# Patient Record
Sex: Male | Born: 1970
Health system: Southern US, Community
[De-identification: ages and names within clinical notes are randomized; demographics above are authoritative.]

## PROBLEM LIST (undated history)

## (undated) ENCOUNTER — Emergency Department (HOSPITAL_BASED_OUTPATIENT_CLINIC_OR_DEPARTMENT_OTHER)

## (undated) DIAGNOSIS — K4090 Unilateral inguinal hernia, without obstruction or gangrene, not specified as recurrent: Secondary | ICD-10-CM

## (undated) DIAGNOSIS — F32A Depression, unspecified: Secondary | ICD-10-CM

## (undated) DIAGNOSIS — J189 Pneumonia, unspecified organism: Secondary | ICD-10-CM

## (undated) DIAGNOSIS — K219 Gastro-esophageal reflux disease without esophagitis: Secondary | ICD-10-CM

## (undated) DIAGNOSIS — R161 Splenomegaly, not elsewhere classified: Secondary | ICD-10-CM

## (undated) DIAGNOSIS — R569 Unspecified convulsions: Secondary | ICD-10-CM

## (undated) DIAGNOSIS — R2689 Other abnormalities of gait and mobility: Secondary | ICD-10-CM

## (undated) DIAGNOSIS — J45909 Unspecified asthma, uncomplicated: Secondary | ICD-10-CM

## (undated) DIAGNOSIS — M1990 Unspecified osteoarthritis, unspecified site: Secondary | ICD-10-CM

## (undated) DIAGNOSIS — Z720 Tobacco use: Secondary | ICD-10-CM

## (undated) DIAGNOSIS — D696 Thrombocytopenia, unspecified: Secondary | ICD-10-CM

## (undated) DIAGNOSIS — R519 Headache, unspecified: Secondary | ICD-10-CM

## (undated) DIAGNOSIS — F419 Anxiety disorder, unspecified: Secondary | ICD-10-CM

## (undated) DIAGNOSIS — J302 Other seasonal allergic rhinitis: Secondary | ICD-10-CM

## (undated) DIAGNOSIS — R269 Unspecified abnormalities of gait and mobility: Secondary | ICD-10-CM

## (undated) HISTORY — DX: Depression, unspecified: F32.A

## (undated) HISTORY — DX: Other seasonal allergic rhinitis: J30.2

## (undated) HISTORY — DX: Unspecified abnormalities of gait and mobility: R26.9

## (undated) HISTORY — PX: HERNIA REPAIR: SHX51

## (undated) HISTORY — DX: Other abnormalities of gait and mobility: R26.89

---

## 1998-03-06 ENCOUNTER — Emergency Department (HOSPITAL_COMMUNITY): Admission: EM | Admit: 1998-03-06 | Discharge: 1998-03-06 | Payer: Self-pay | Admitting: Emergency Medicine

## 1998-06-22 ENCOUNTER — Emergency Department (HOSPITAL_COMMUNITY): Admission: EM | Admit: 1998-06-22 | Discharge: 1998-06-22 | Payer: Self-pay | Admitting: *Deleted

## 1998-07-02 ENCOUNTER — Ambulatory Visit (HOSPITAL_BASED_OUTPATIENT_CLINIC_OR_DEPARTMENT_OTHER): Admission: RE | Admit: 1998-07-02 | Discharge: 1998-07-02 | Payer: Self-pay | Admitting: *Deleted

## 1998-09-02 ENCOUNTER — Inpatient Hospital Stay (HOSPITAL_COMMUNITY): Admission: EM | Admit: 1998-09-02 | Discharge: 1998-09-06 | Payer: Self-pay | Admitting: *Deleted

## 1998-09-25 ENCOUNTER — Inpatient Hospital Stay (HOSPITAL_COMMUNITY): Admission: AD | Admit: 1998-09-25 | Discharge: 1998-09-29 | Payer: Self-pay | Admitting: *Deleted

## 1998-10-01 ENCOUNTER — Encounter (HOSPITAL_COMMUNITY): Admission: RE | Admit: 1998-10-01 | Discharge: 1998-12-30 | Payer: Self-pay

## 1999-03-14 ENCOUNTER — Emergency Department (HOSPITAL_COMMUNITY): Admission: EM | Admit: 1999-03-14 | Discharge: 1999-03-14 | Payer: Self-pay | Admitting: Emergency Medicine

## 1999-09-23 ENCOUNTER — Encounter: Admission: RE | Admit: 1999-09-23 | Discharge: 1999-10-01 | Payer: Self-pay | Admitting: Family Medicine

## 2000-08-10 ENCOUNTER — Ambulatory Visit (HOSPITAL_COMMUNITY): Admission: EM | Admit: 2000-08-10 | Discharge: 2000-08-11 | Payer: Self-pay

## 2001-08-22 ENCOUNTER — Ambulatory Visit (HOSPITAL_COMMUNITY): Admission: EM | Admit: 2001-08-22 | Discharge: 2001-08-22 | Payer: Self-pay | Admitting: Emergency Medicine

## 2001-08-22 ENCOUNTER — Emergency Department (HOSPITAL_COMMUNITY): Admission: EM | Admit: 2001-08-22 | Discharge: 2001-08-22 | Payer: Self-pay | Admitting: Emergency Medicine

## 2001-08-22 ENCOUNTER — Encounter: Payer: Self-pay | Admitting: Emergency Medicine

## 2002-12-16 ENCOUNTER — Emergency Department (HOSPITAL_COMMUNITY): Admission: EM | Admit: 2002-12-16 | Discharge: 2002-12-16 | Payer: Self-pay | Admitting: Emergency Medicine

## 2002-12-16 ENCOUNTER — Encounter: Payer: Self-pay | Admitting: Emergency Medicine

## 2003-11-01 ENCOUNTER — Emergency Department (HOSPITAL_COMMUNITY): Admission: EM | Admit: 2003-11-01 | Discharge: 2003-11-02 | Payer: Self-pay | Admitting: Emergency Medicine

## 2004-02-02 ENCOUNTER — Ambulatory Visit (HOSPITAL_COMMUNITY): Admission: RE | Admit: 2004-02-02 | Discharge: 2004-02-02 | Payer: Self-pay | Admitting: Urology

## 2004-02-02 ENCOUNTER — Ambulatory Visit (HOSPITAL_BASED_OUTPATIENT_CLINIC_OR_DEPARTMENT_OTHER): Admission: RE | Admit: 2004-02-02 | Discharge: 2004-02-02 | Payer: Self-pay | Admitting: Urology

## 2004-02-02 ENCOUNTER — Encounter (INDEPENDENT_AMBULATORY_CARE_PROVIDER_SITE_OTHER): Payer: Self-pay | Admitting: Specialist

## 2004-04-21 ENCOUNTER — Emergency Department (HOSPITAL_COMMUNITY): Admission: EM | Admit: 2004-04-21 | Discharge: 2004-04-21 | Payer: Self-pay

## 2004-04-22 ENCOUNTER — Emergency Department (HOSPITAL_COMMUNITY): Admission: EM | Admit: 2004-04-22 | Discharge: 2004-04-22 | Payer: Self-pay | Admitting: Emergency Medicine

## 2004-11-24 HISTORY — PX: INGUINAL HERNIA REPAIR: SHX194

## 2005-02-05 ENCOUNTER — Emergency Department (HOSPITAL_COMMUNITY): Admission: EM | Admit: 2005-02-05 | Discharge: 2005-02-05 | Payer: Self-pay | Admitting: Emergency Medicine

## 2005-04-01 ENCOUNTER — Emergency Department (HOSPITAL_COMMUNITY): Admission: EM | Admit: 2005-04-01 | Discharge: 2005-04-02 | Payer: Self-pay | Admitting: Emergency Medicine

## 2005-04-02 ENCOUNTER — Encounter: Admission: RE | Admit: 2005-04-02 | Discharge: 2005-04-02 | Payer: Self-pay | Admitting: Emergency Medicine

## 2005-04-02 IMAGING — CR DG ANKLE COMPLETE 3+V*R*
3 series · 3 of 3 positions shown · non-contrast
Comparison: none

CLINICAL DATA: Lateral ankle pain

RIGHT ANKLE - 3 VIEW

[view not recorded (1 of 3)]
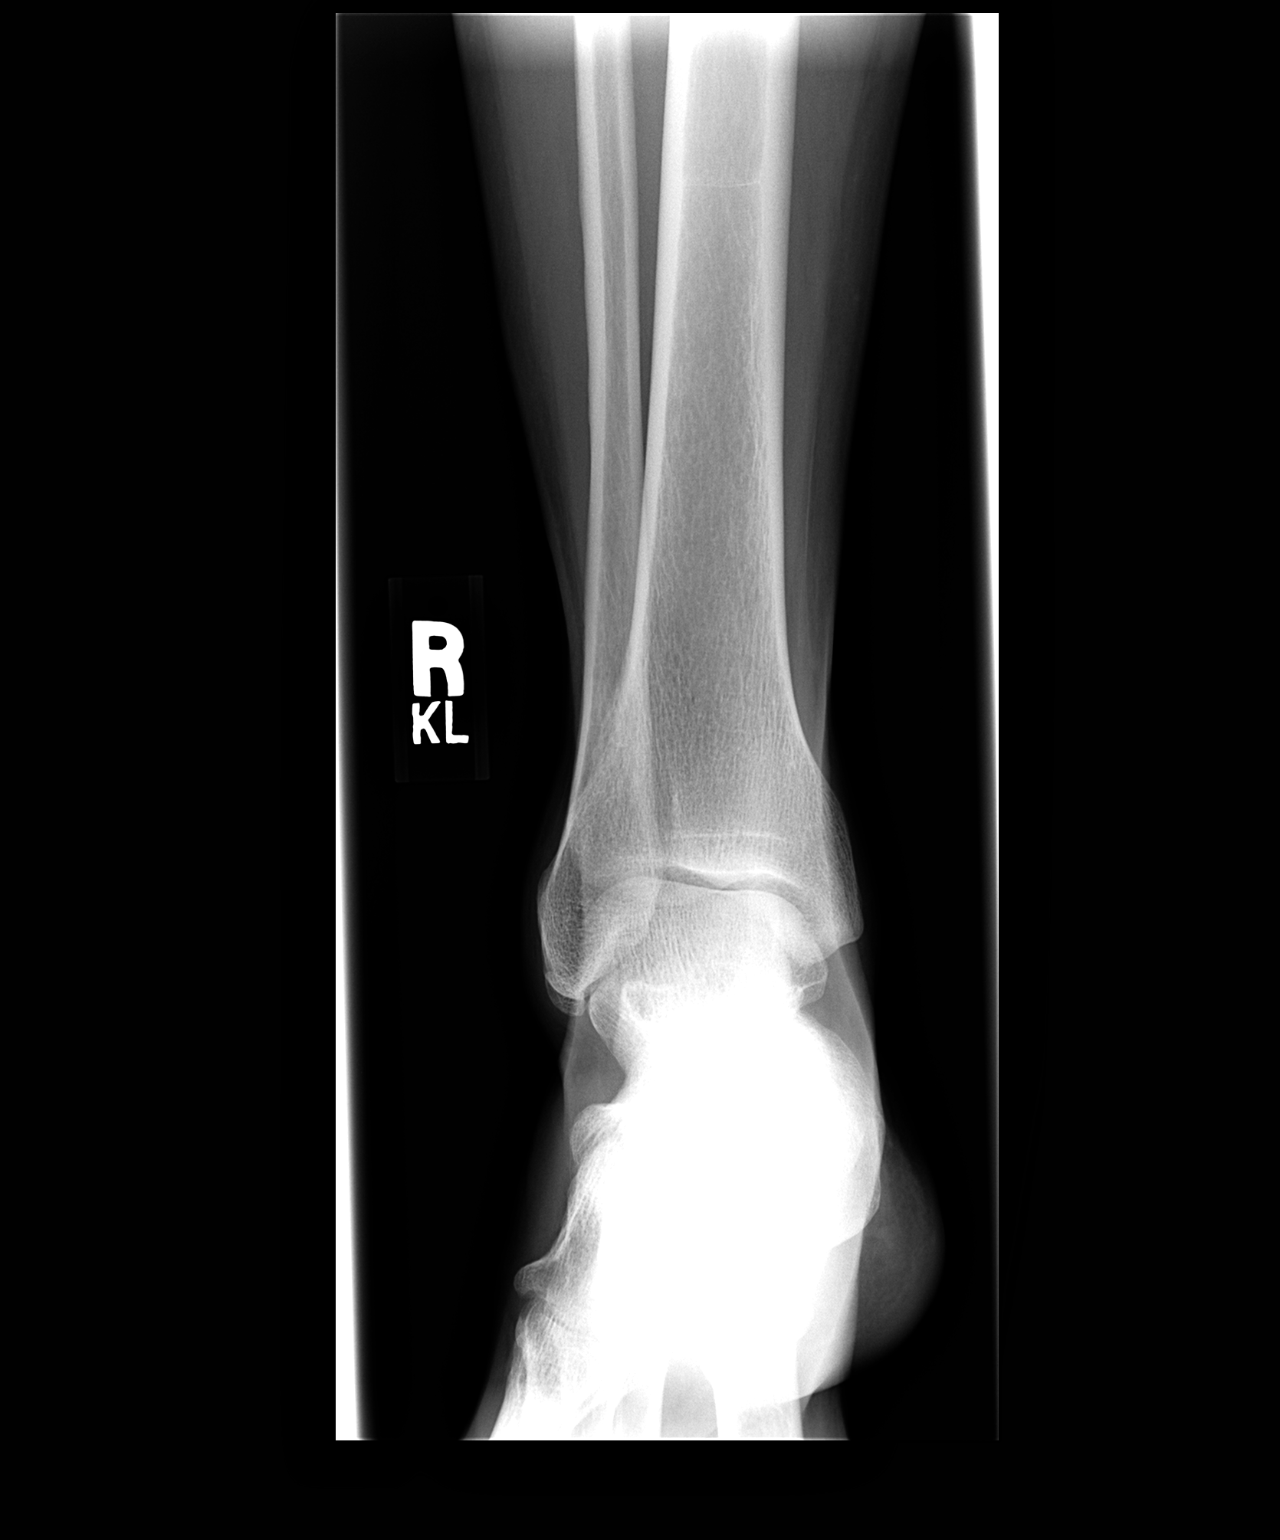

[view not recorded (2 of 3)]
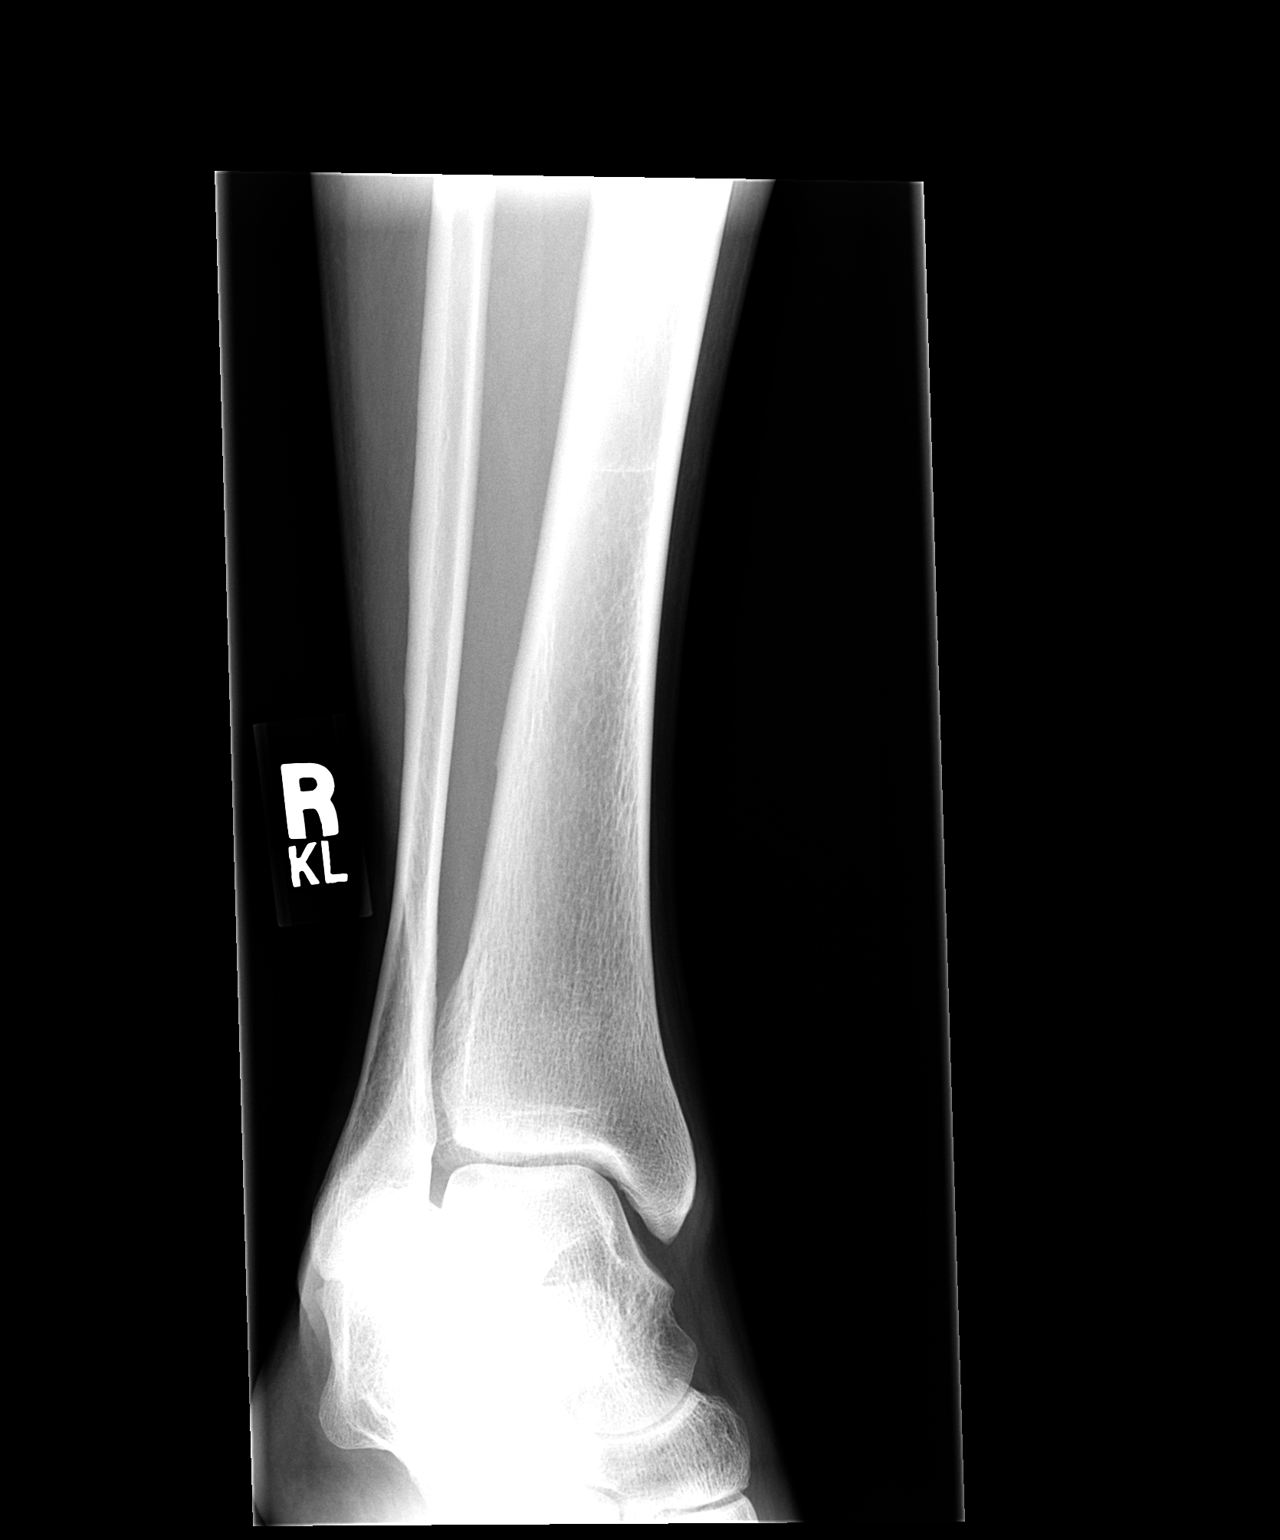

[view not recorded (3 of 3)]
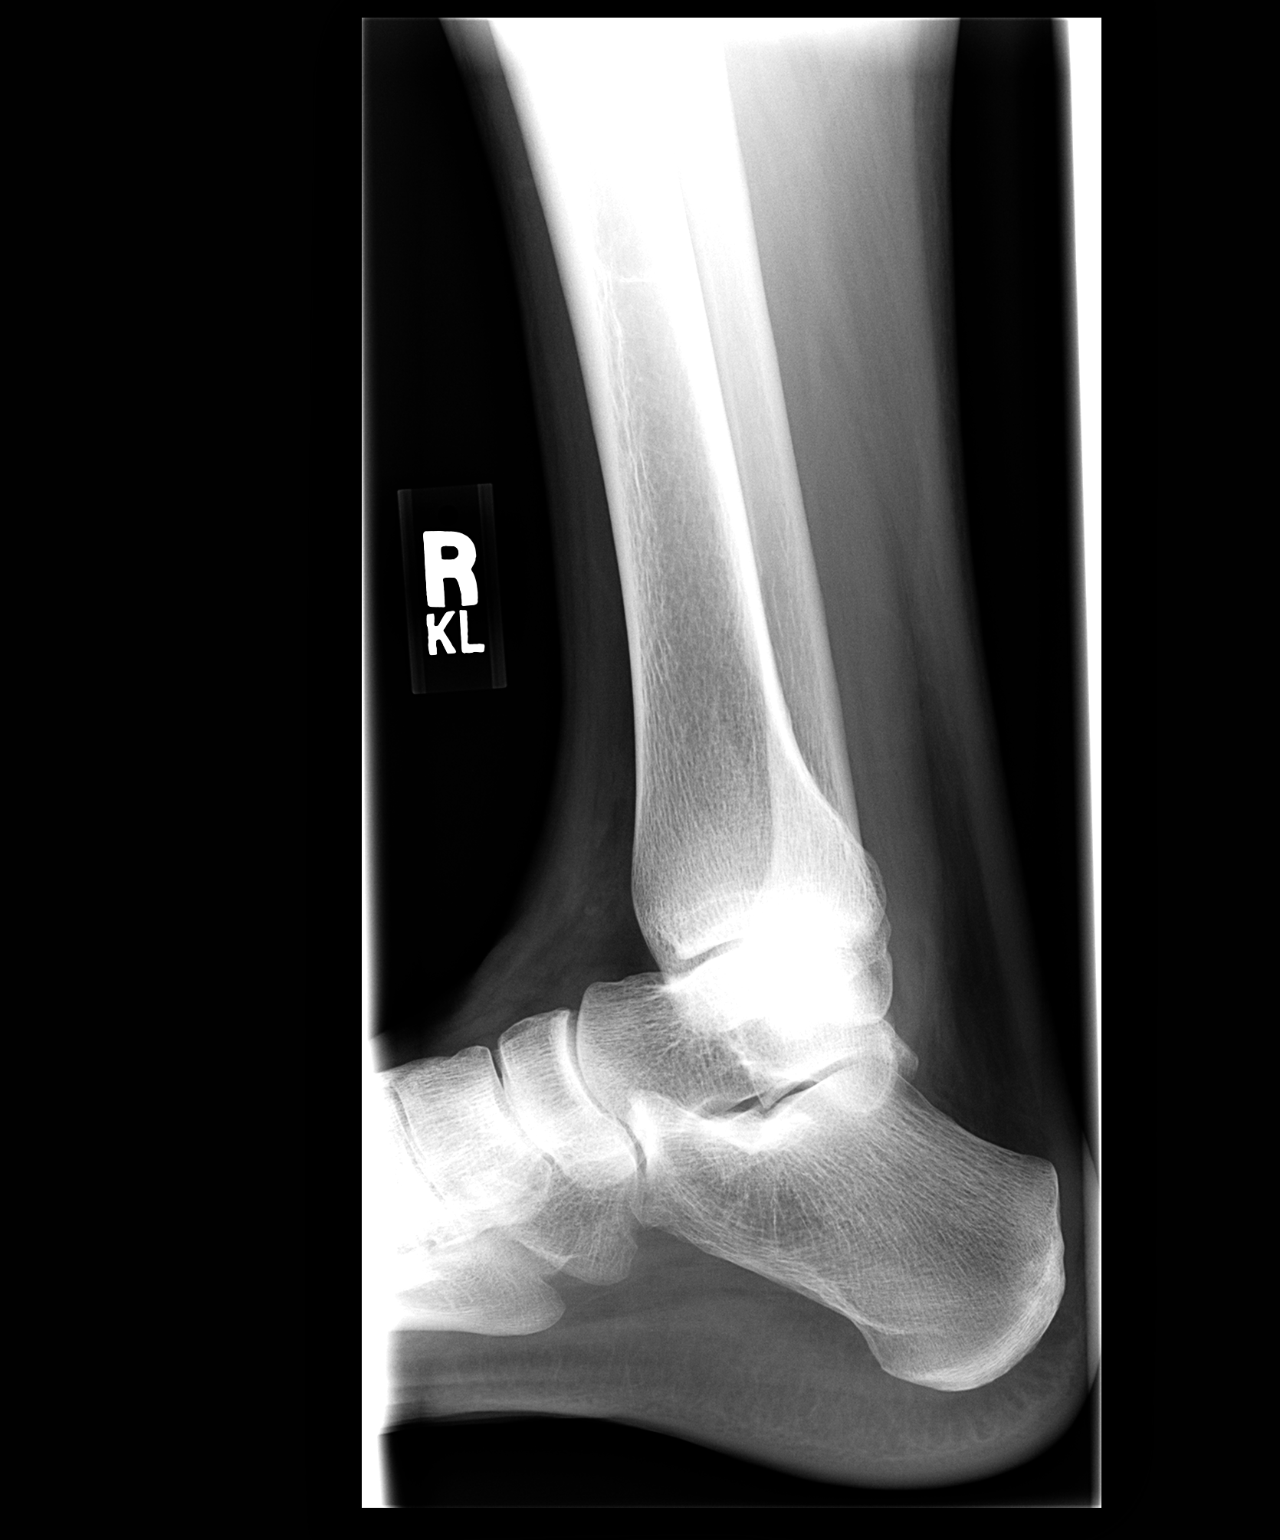

[3 of 3 positions shown; findings below may reference images not displayed]

FINDINGS: No acute bony abnormality. No evidence of fracture, subluxation, or
dislocation. Soft tissues unremarkable.

IMPRESSION

No acute bony abnormality.

## 2005-06-10 ENCOUNTER — Emergency Department (HOSPITAL_COMMUNITY): Admission: EM | Admit: 2005-06-10 | Discharge: 2005-06-10 | Payer: Self-pay | Admitting: Emergency Medicine

## 2005-06-10 IMAGING — CR DG CHEST 2V
2 series · 2 of 2 positions shown · non-contrast
Comparison: none

CLINICAL DATA: 1-day left chest pain.  Smoker.  
 CHEST ? 2 VIEW:
 The heart size and mediastinal contours are within normal limits.  Both lungs are clear.  The visualized skeletal structures are unremarkable.  There is interval clearing at the right lung since [DATE] [REDACTED] chest x-ray.

[w chest pa]
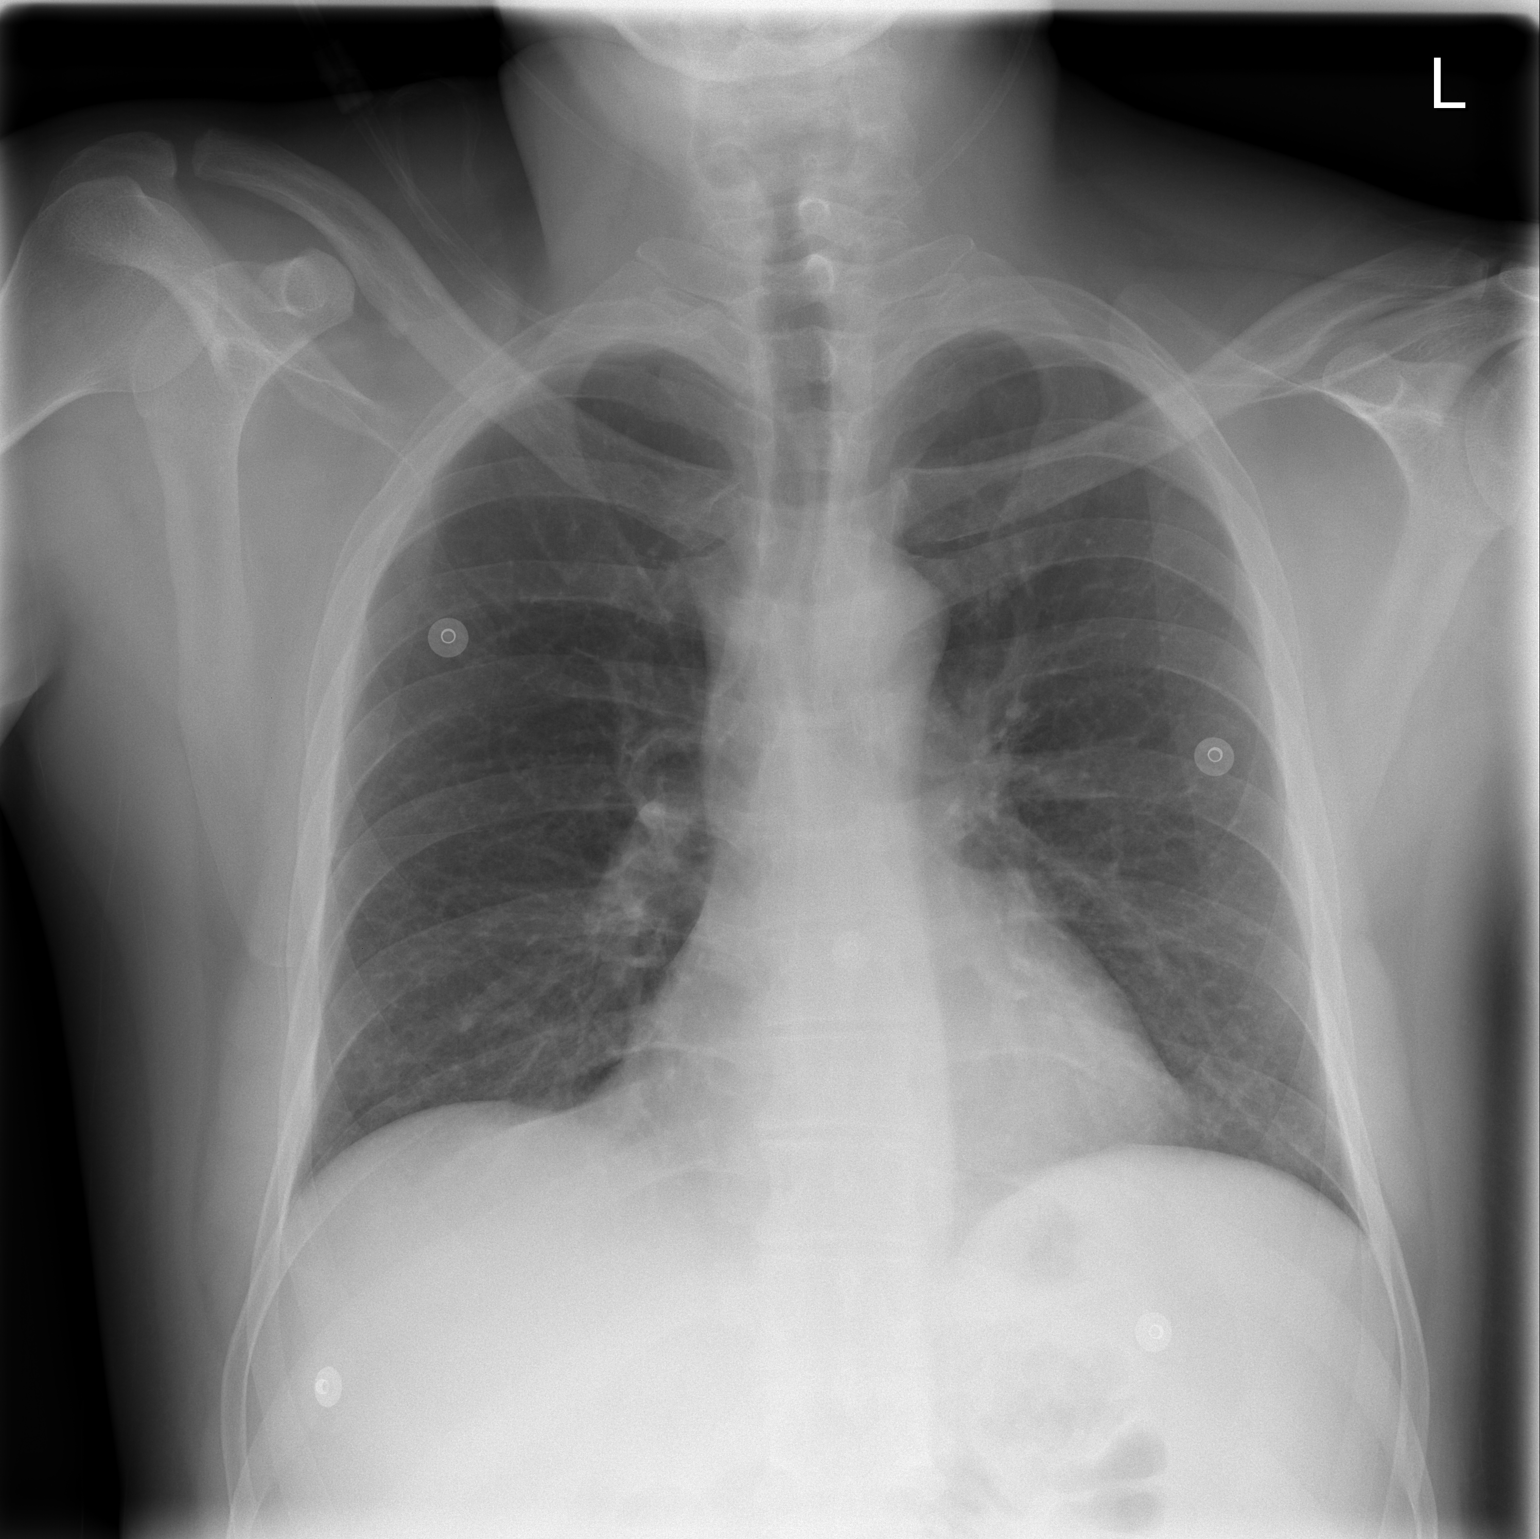

[w chest lat]
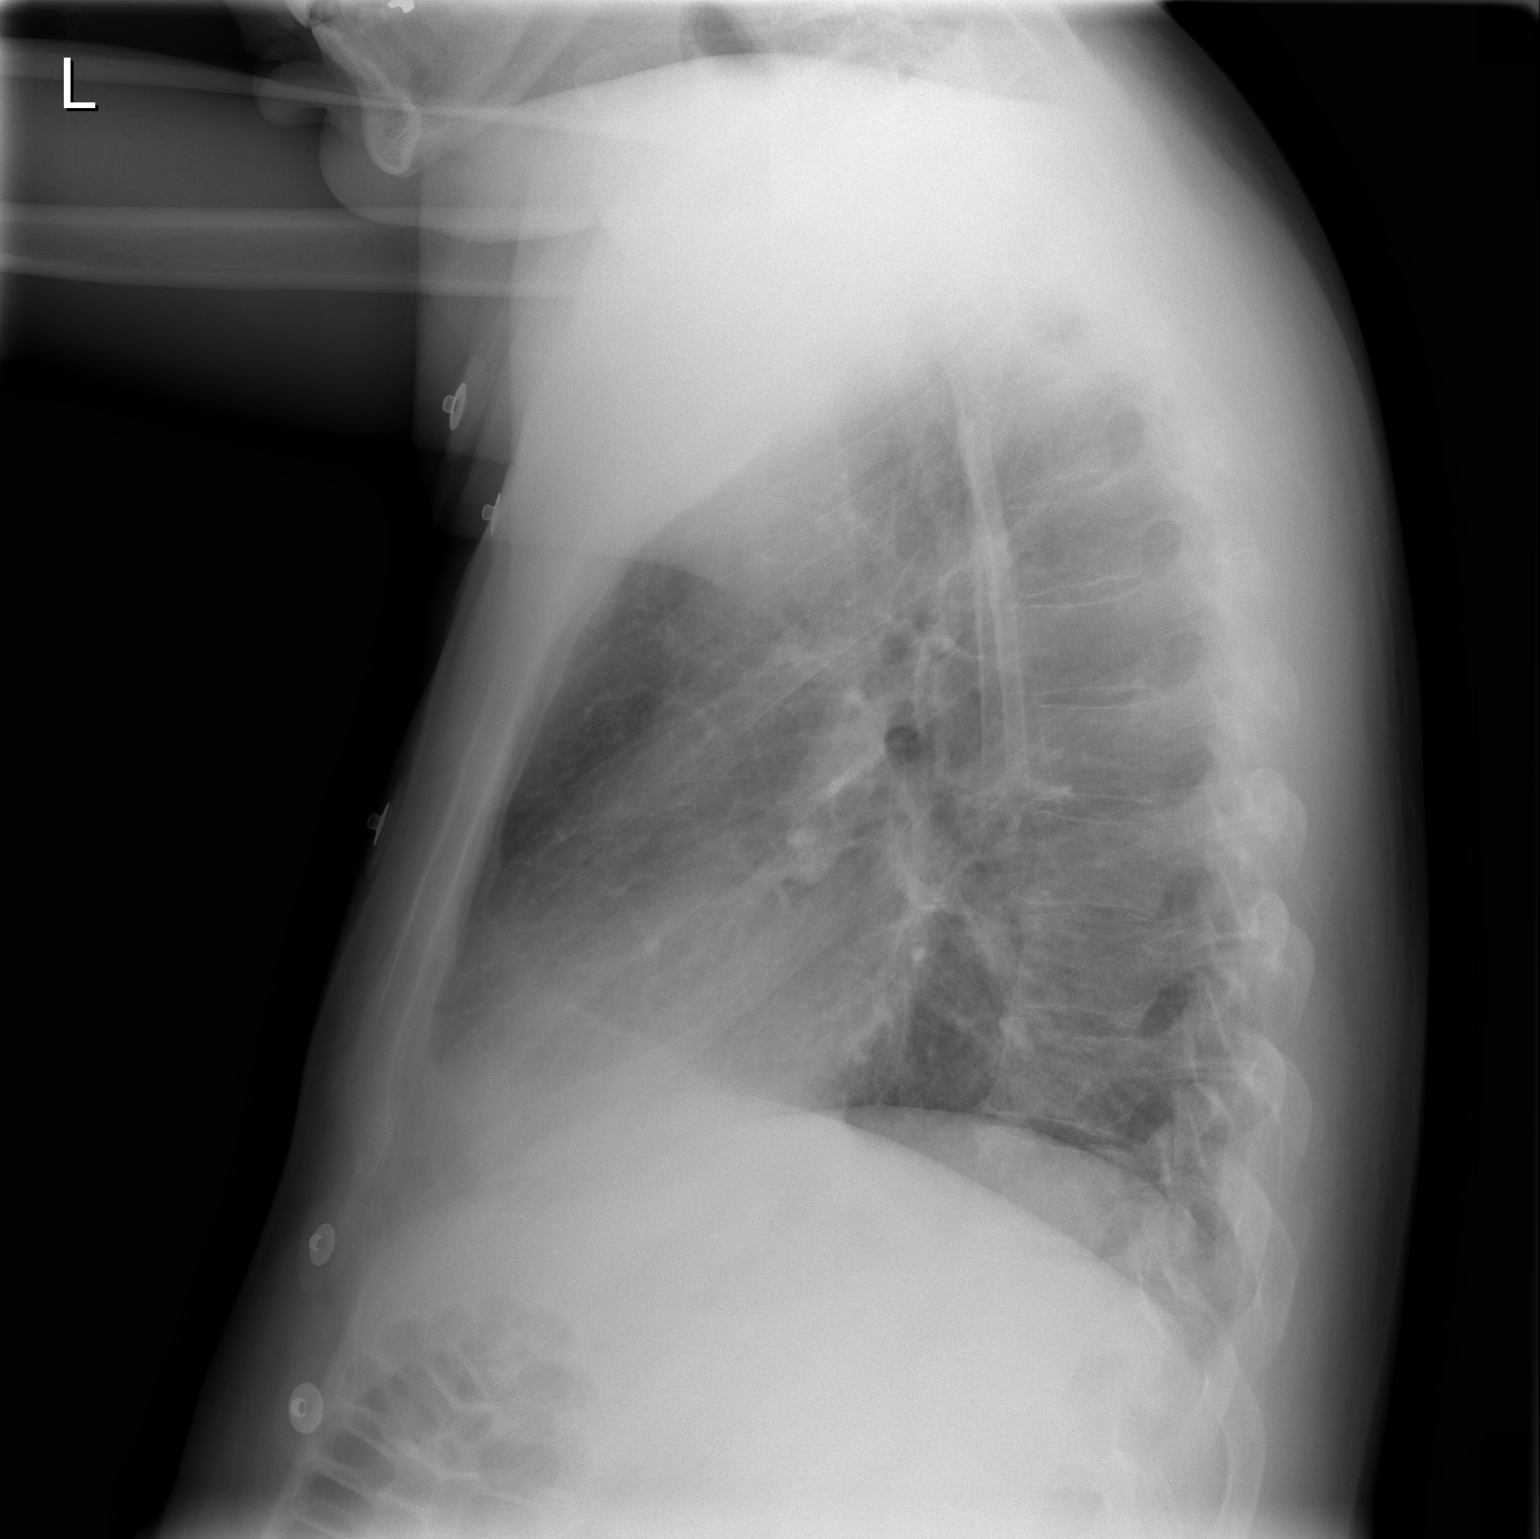

[2 of 2 positions shown; findings below may reference images not displayed]

IMPRESSION: Since [REDACTED] chest x-ray [DATE], interval clearing right lung ? currently no active disease.

## 2005-10-18 ENCOUNTER — Emergency Department (HOSPITAL_COMMUNITY): Admission: EM | Admit: 2005-10-18 | Discharge: 2005-10-18 | Payer: Self-pay | Admitting: Emergency Medicine

## 2006-06-03 ENCOUNTER — Emergency Department (HOSPITAL_COMMUNITY): Admission: EM | Admit: 2006-06-03 | Discharge: 2006-06-03 | Payer: Self-pay | Admitting: Emergency Medicine

## 2006-06-12 ENCOUNTER — Ambulatory Visit (HOSPITAL_COMMUNITY): Admission: RE | Admit: 2006-06-12 | Discharge: 2006-06-12 | Payer: Self-pay | Admitting: Urology

## 2006-06-16 IMAGING — CR DG ANKLE COMPLETE 3+V*L*
4 series · 4 of 4 positions shown · non-contrast
Comparison: none

CLINICAL DATA: Left ankle pain and swelling status-post injury. 

 LEFT ANKLE ? 3 VIEW:
 There is no evidence of fracture or dislocation.  No other significant bone or soft tissue abnormalities are identified.

[t ankle joint ap left]
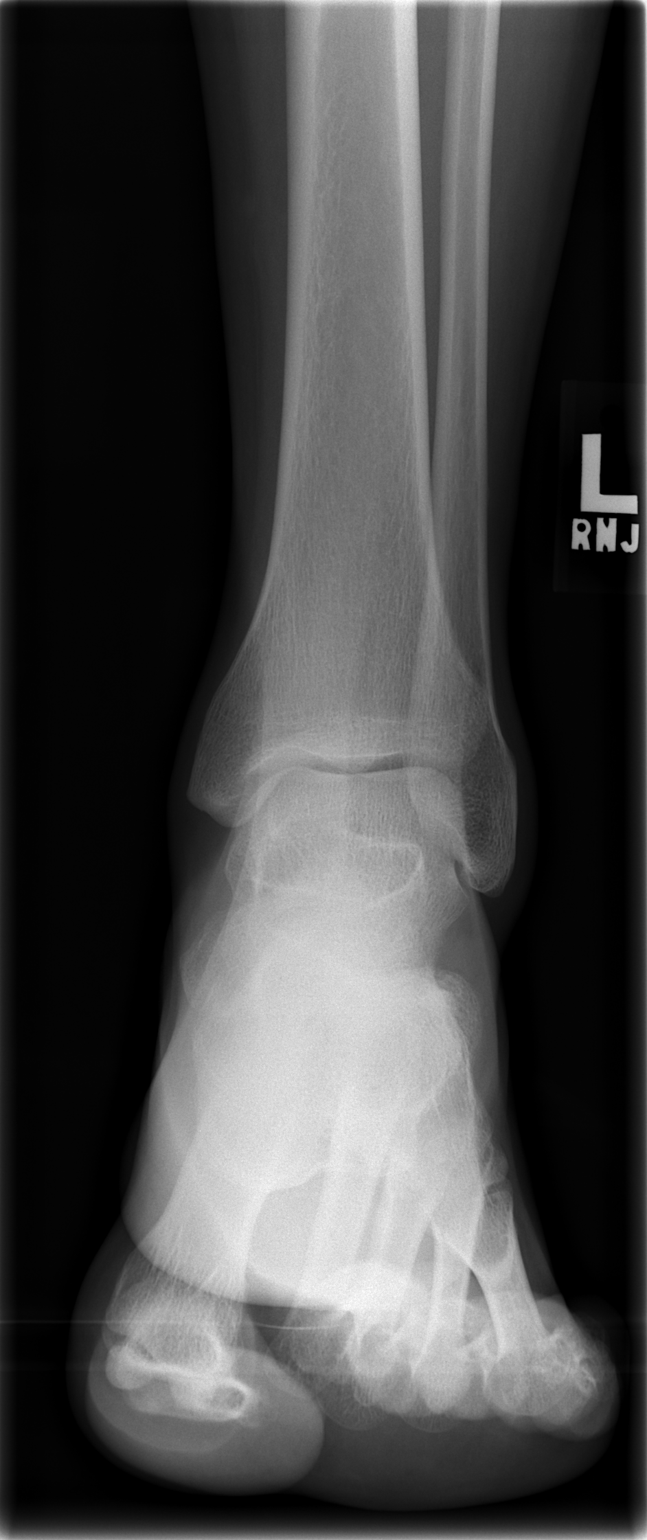

[t ankle joint oblique left (1 of 2)]
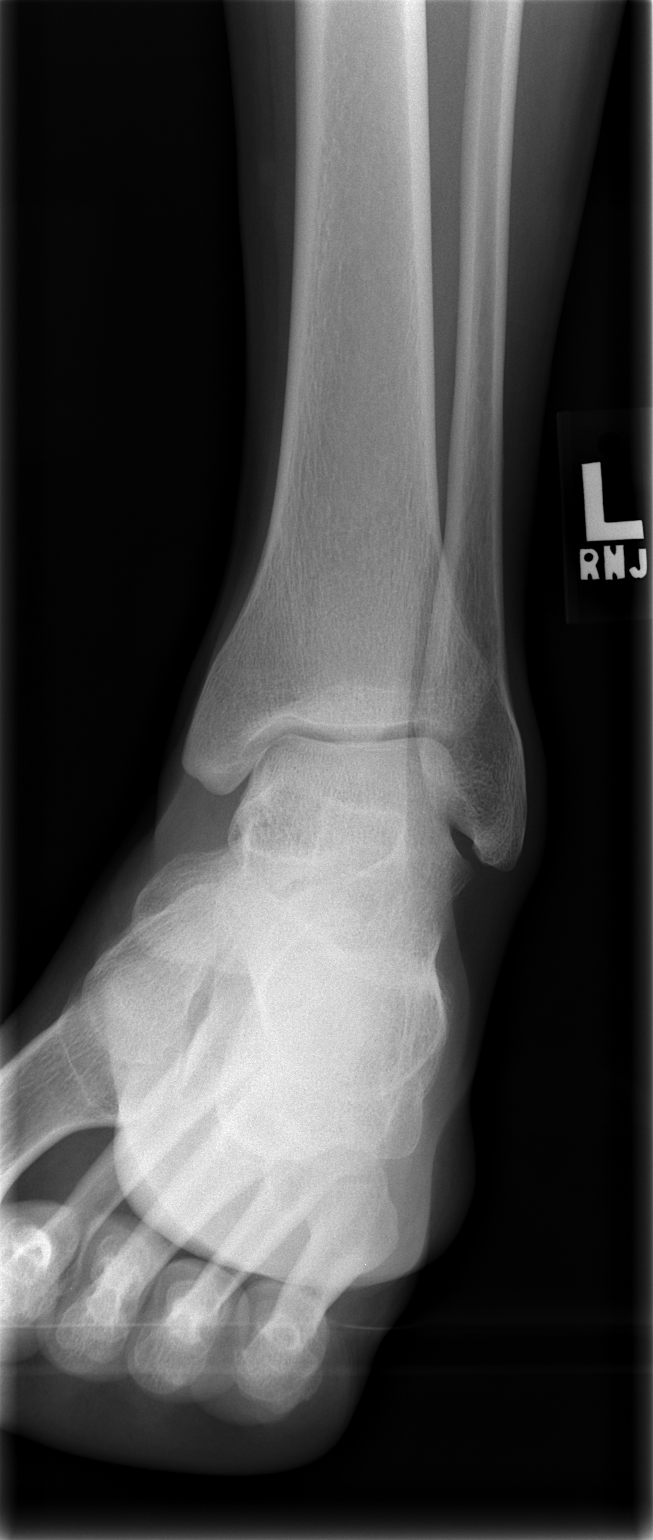

[t ankle joint oblique left (2 of 2)]
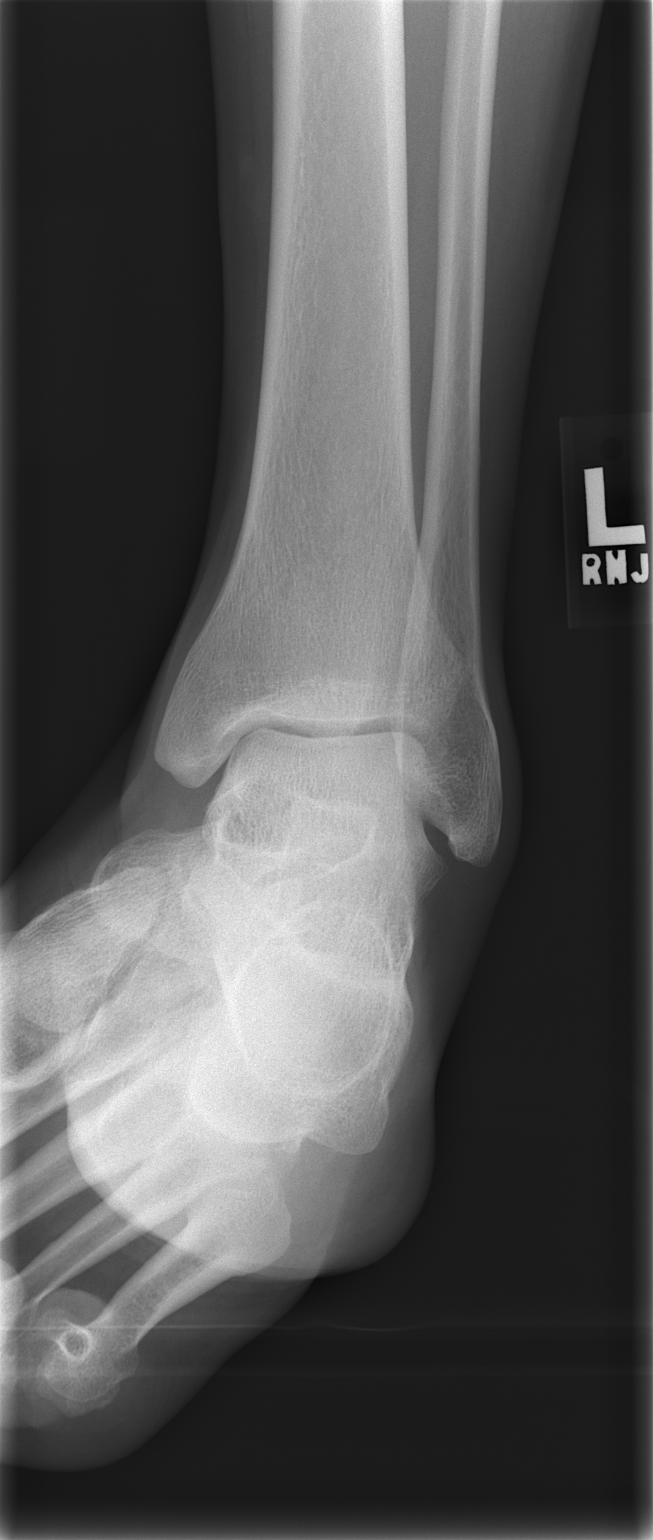

[t ankle joint lat left]
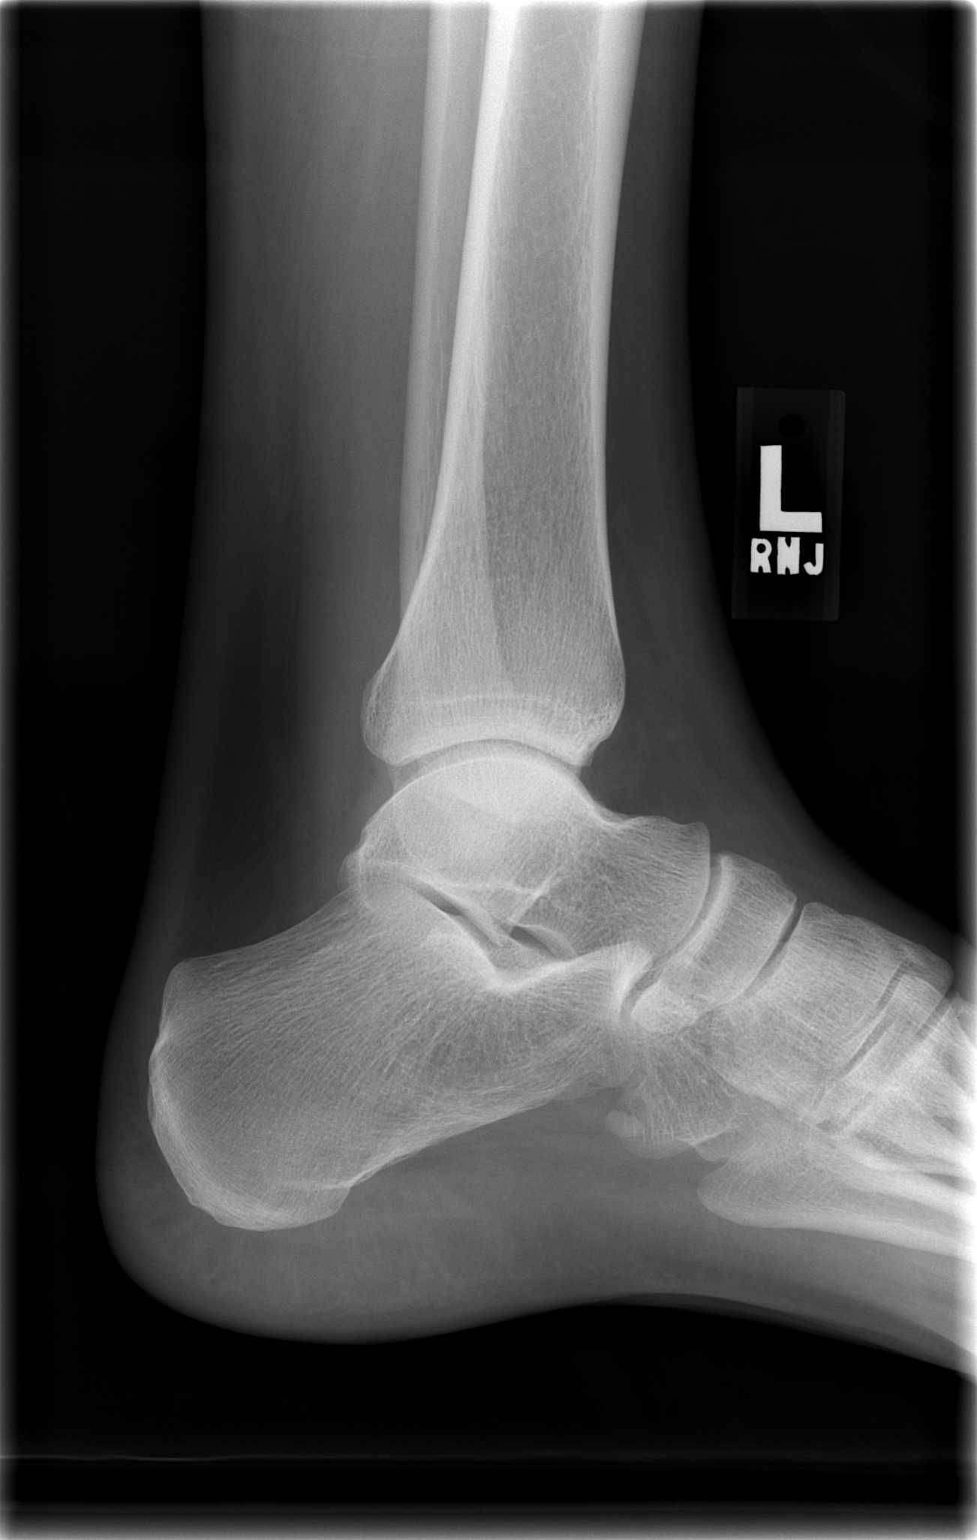

[4 of 4 positions shown; findings below may reference images not displayed]

IMPRESSION: Normal study.

## 2007-01-04 ENCOUNTER — Emergency Department (HOSPITAL_COMMUNITY): Admission: EM | Admit: 2007-01-04 | Discharge: 2007-01-04 | Payer: Self-pay | Admitting: Emergency Medicine

## 2007-01-04 IMAGING — US US ART/VEN ABD/PELV/SCROTUM DOPPLER COMPLETE
1 series · 14 of 25 positions shown · non-contrast
Comparison: none

CLINICAL DATA: Left testicular pain.  Question testicular torsion.  
 SCROTAL ULTRASOUND:
 DOPPLER ULTRASOUND OF THE TESTICLES:
TECHNIQUE: Complete ultrasound examination of the testicles, epididymis, and other scrotal structures was performed.  Color and spectral Doppler ultrasound were also utilized to evaluate blood flow to the testicles.

[Series 1: unknown · 0.09mm/px · 14 of 39 slices shown]
[im 1/39]
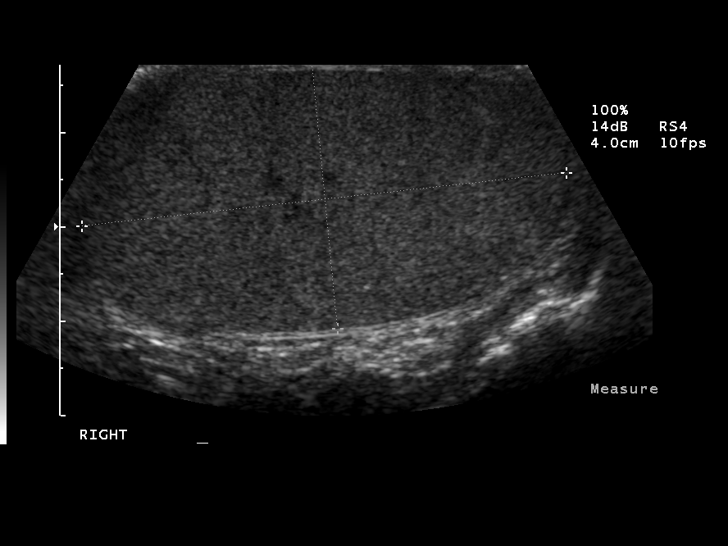
[im 4/39]
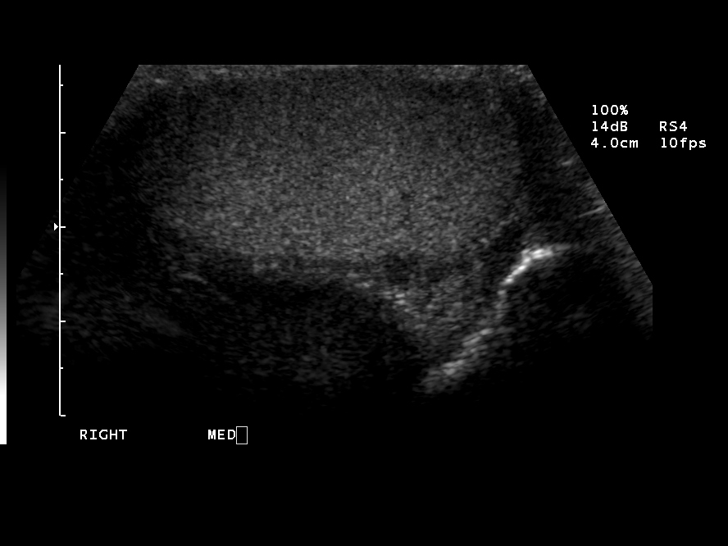
[im 7/39]
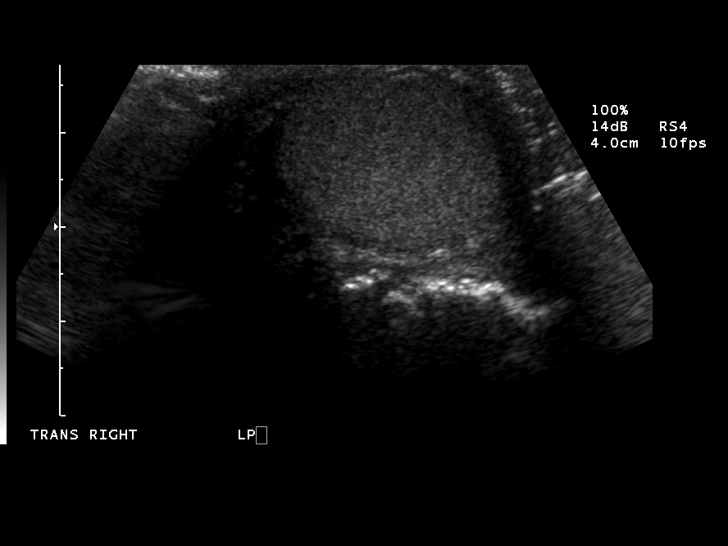
[im 10/39]
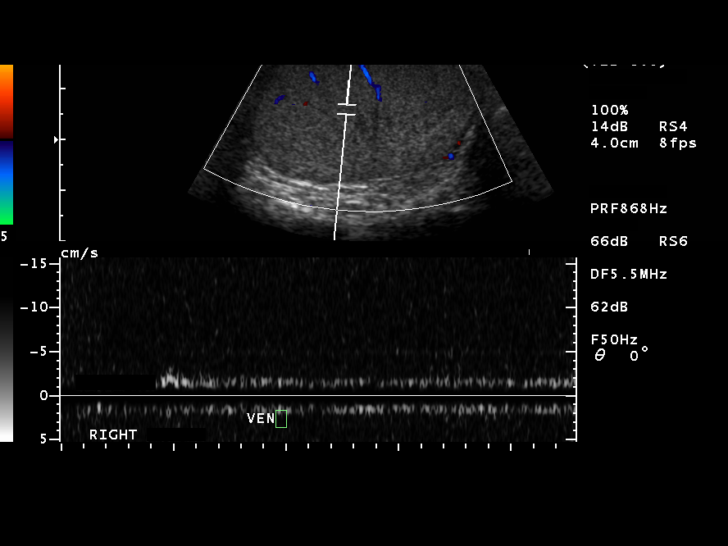
[im 13/39]
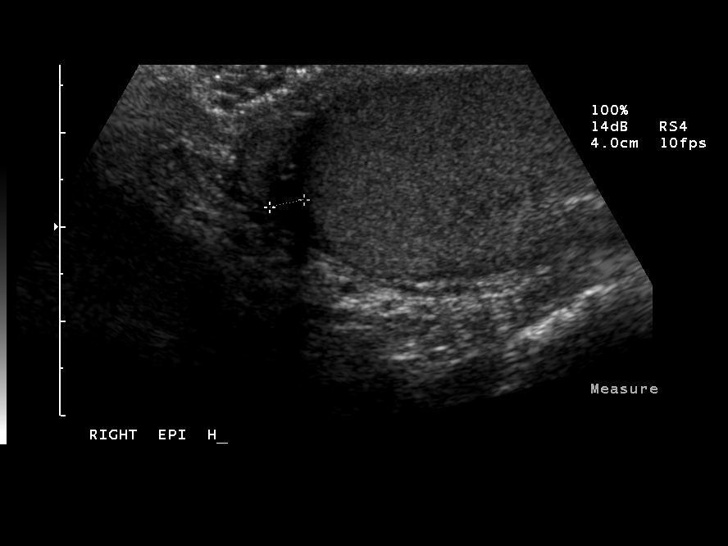
[im 15/39]
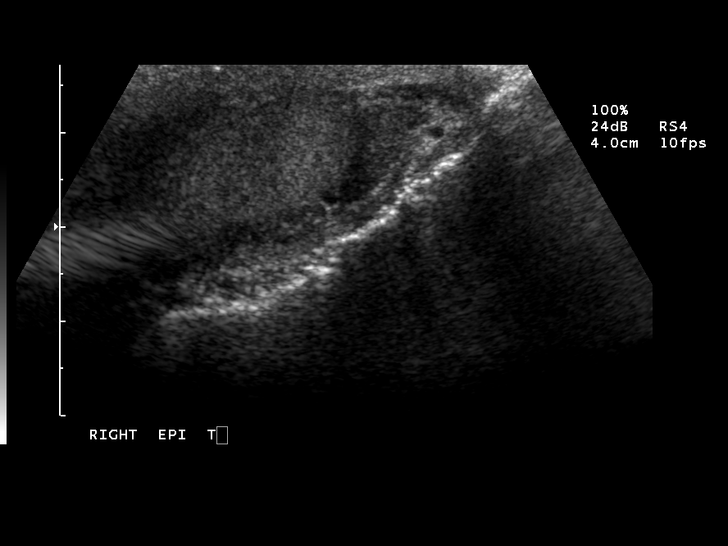
[im 18/39]
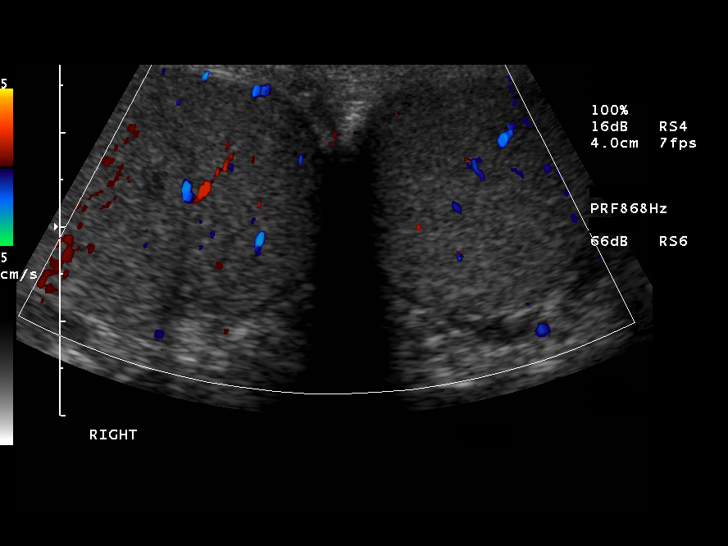
[im 21/39]
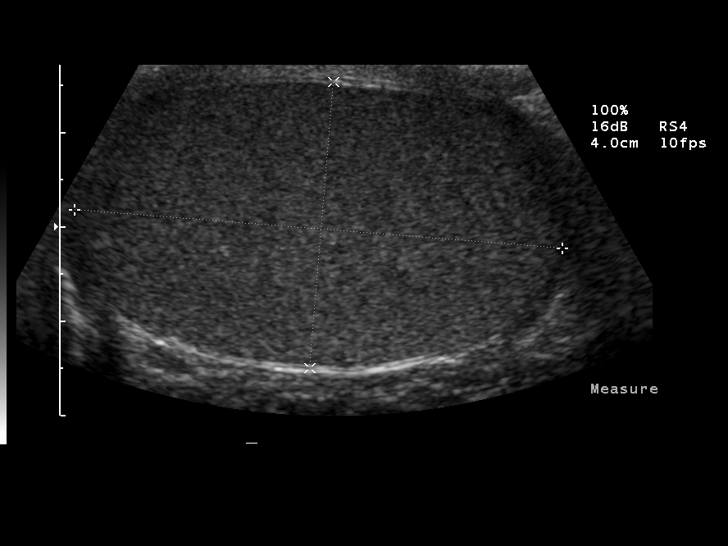
[im 24/39]
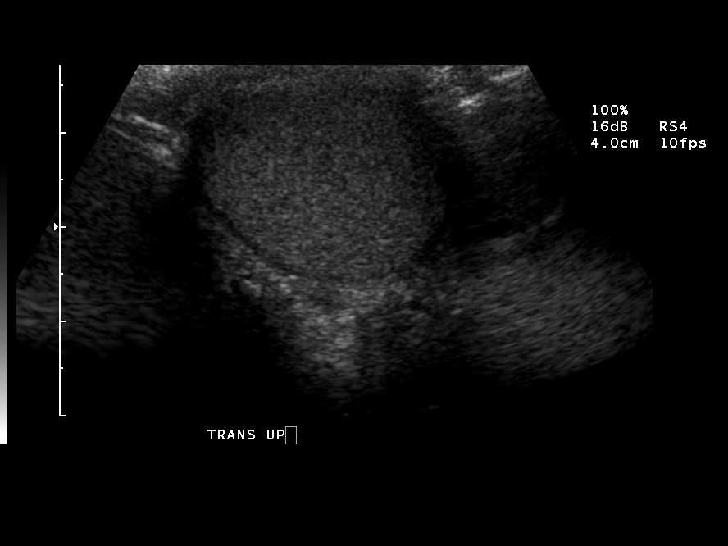
[im 26/39]
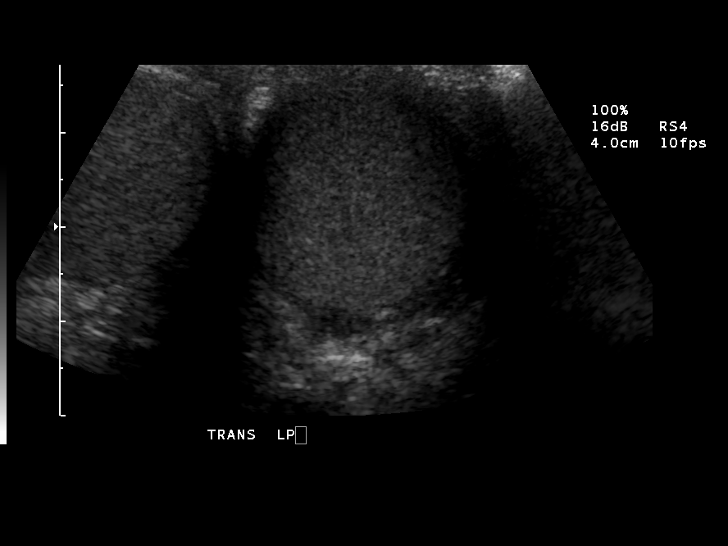
[im 29/39]
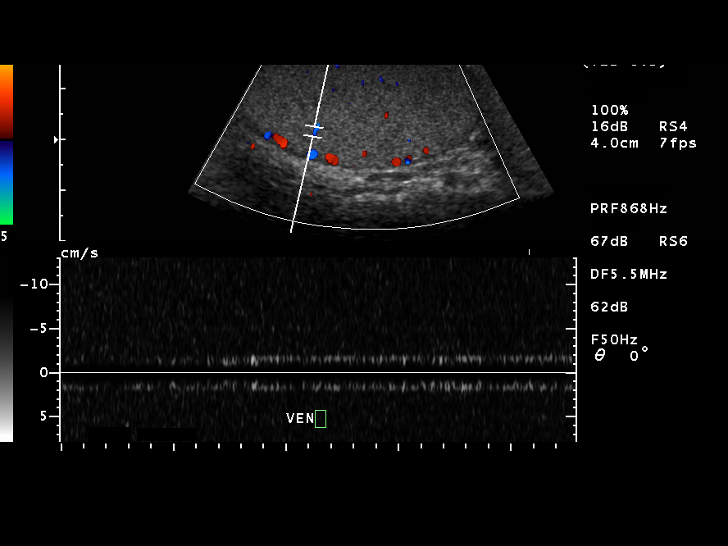
[im 32/39]
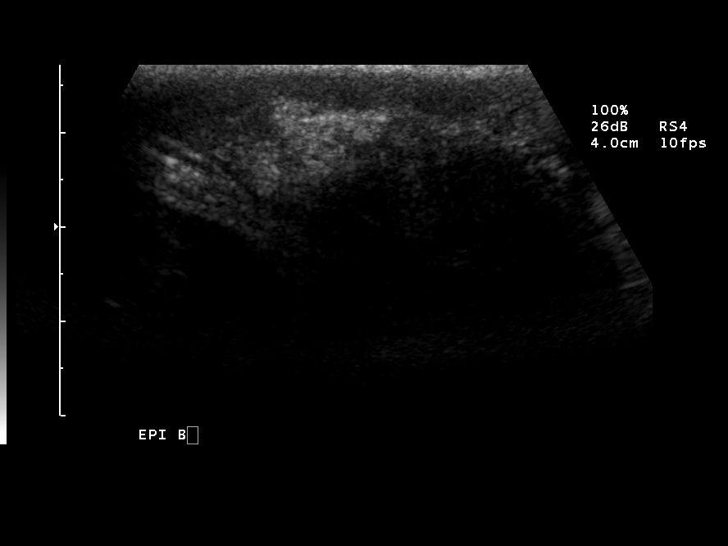
[im 35/39]
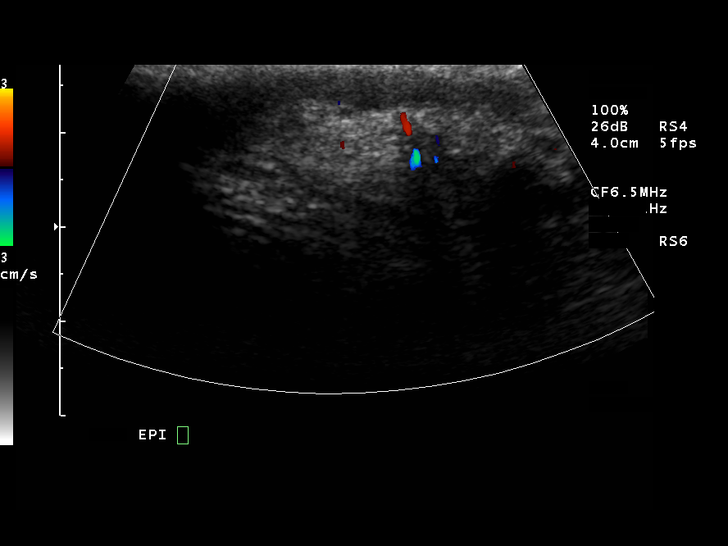
[im 39/39]
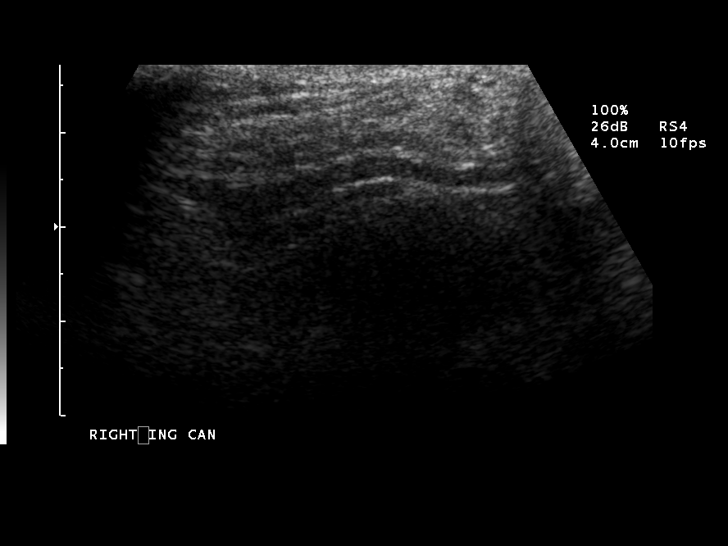

[14 of 25 positions shown; findings below may reference images not displayed]

FINDINGS: The right testicle measures 5.2 x 3.0 x 3.3 cm.  The left testicle measures 5.2 x 3.0 x 3.3 cm.  There is color Doppler signal with arterial and venous waveforms for both testicles.  No evidence of torsion.  Testicles demonstrate homogeneous echotexture bilaterally.  3.7 mm right epididymal head cyst is noted.  Epididymis are otherwise unremarkable.  No hydrocele or varicocele.
IMPRESSION: Negative for torsion or other acute abnormality.

## 2007-05-25 ENCOUNTER — Emergency Department (HOSPITAL_COMMUNITY): Admission: EM | Admit: 2007-05-25 | Discharge: 2007-05-25 | Payer: Self-pay | Admitting: Emergency Medicine

## 2007-05-25 IMAGING — CR DG FOOT COMPLETE 3+V*R*
3 series · 3 of 3 positions shown · non-contrast
Comparison: none

HISTORY: Right foot pain, machinery fell on foot

RIGHT FOOT 3 VIEWS:
Bone mineralization normal.
Joint spaces preserved.
No fracture, dislocation, or bone destruction.

[view not recorded (1 of 3)]
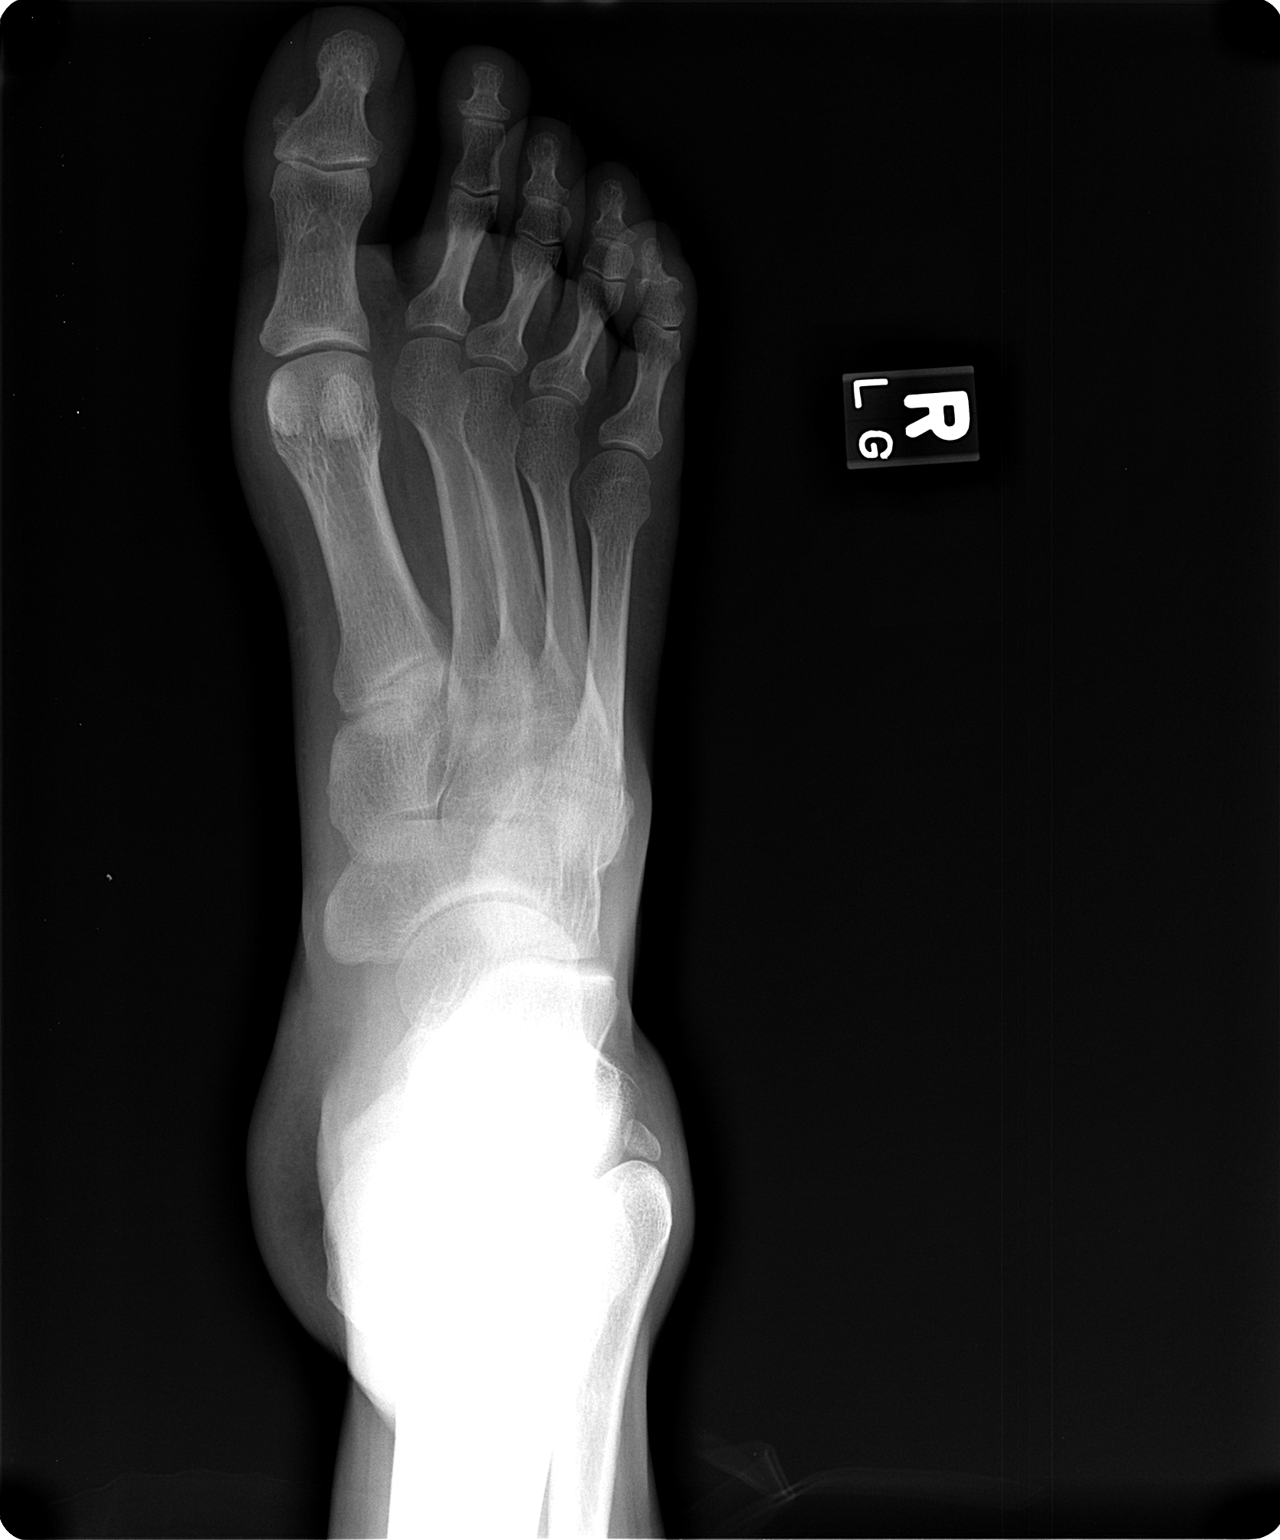

[view not recorded (2 of 3)]
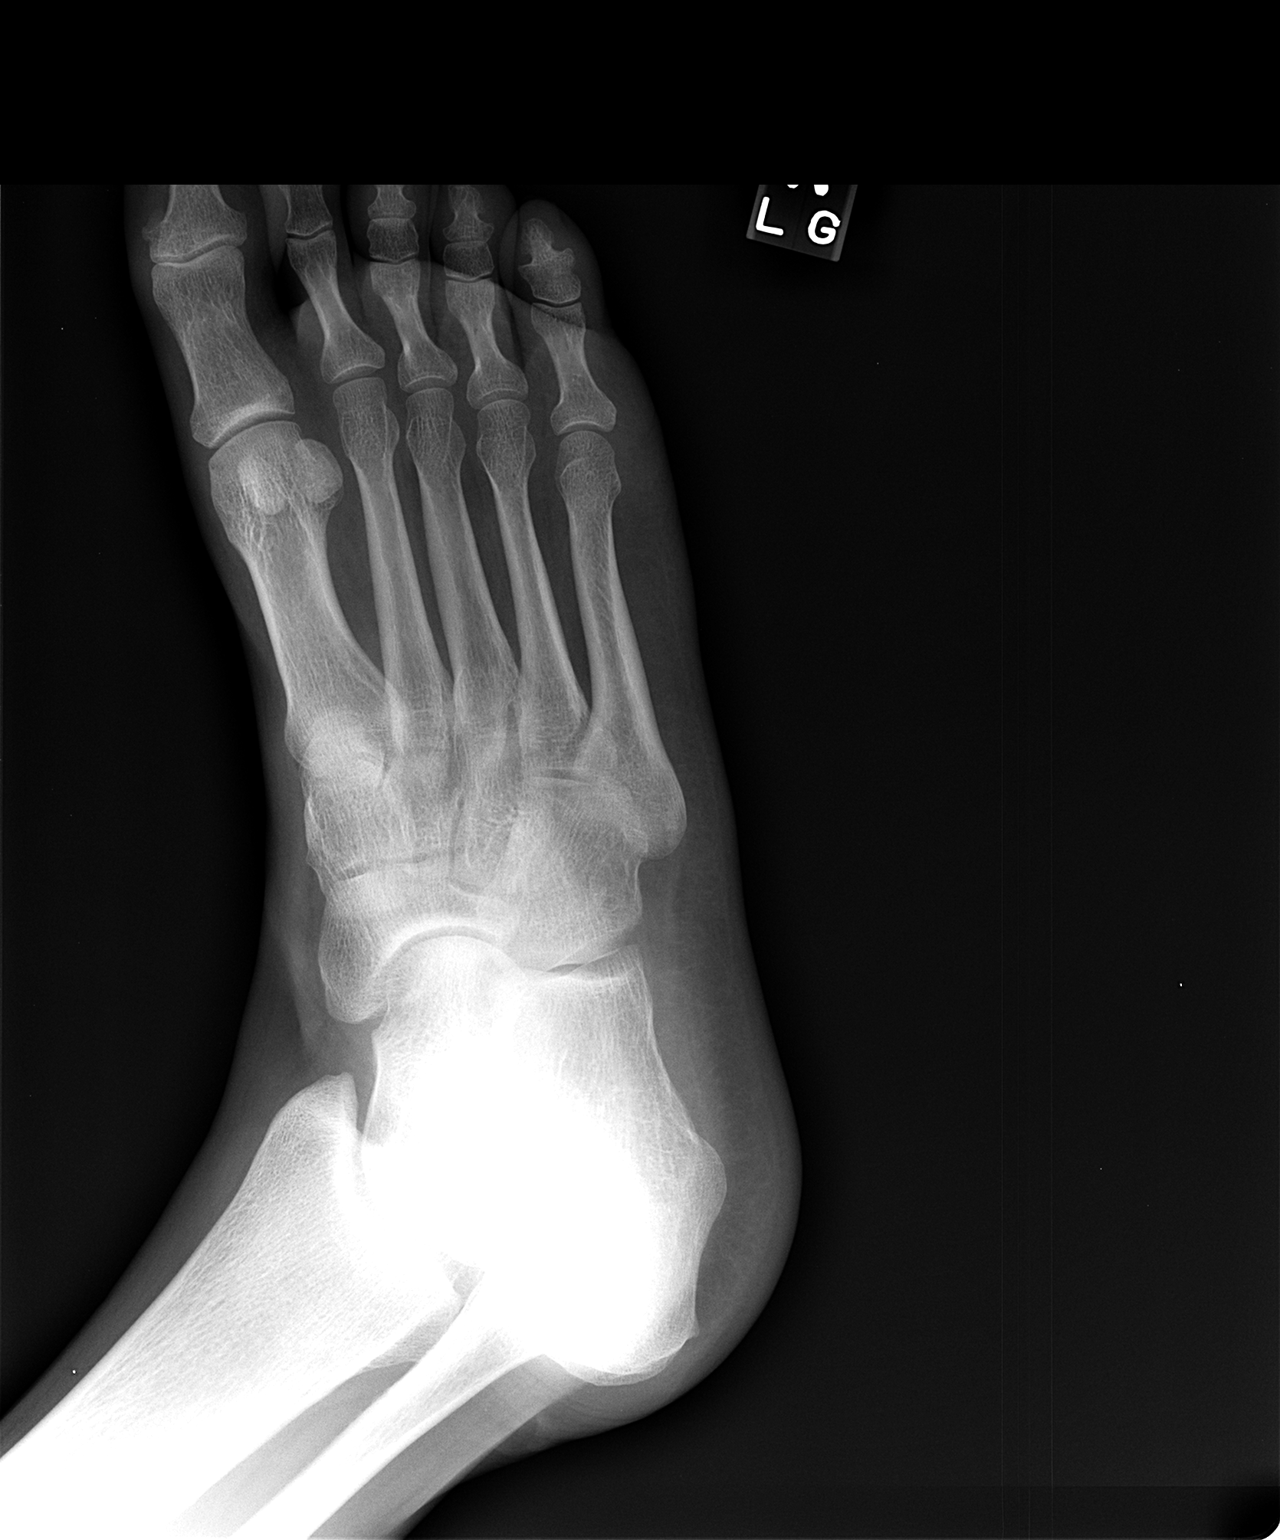

[view not recorded (3 of 3)]
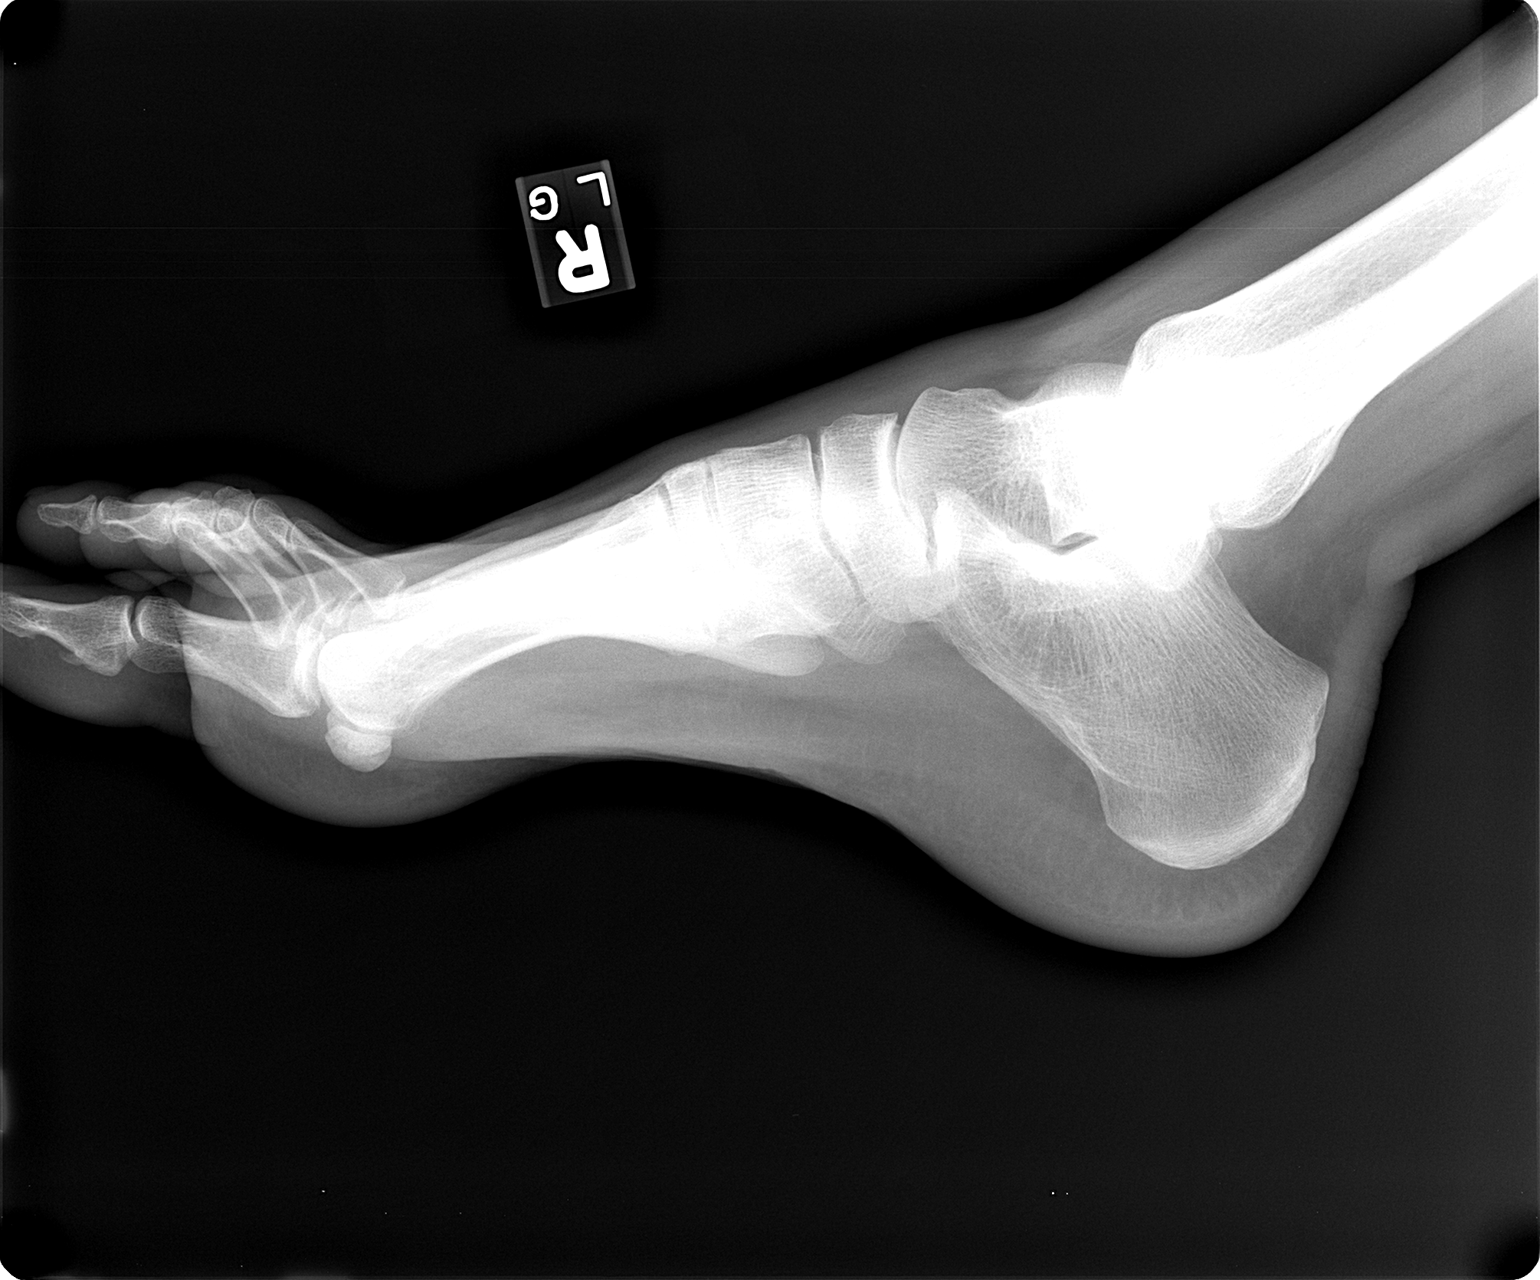

[3 of 3 positions shown; findings below may reference images not displayed]

IMPRESSION: No acute bony abnormalities.

## 2007-08-17 ENCOUNTER — Emergency Department (HOSPITAL_COMMUNITY): Admission: EM | Admit: 2007-08-17 | Discharge: 2007-08-17 | Payer: Self-pay | Admitting: Emergency Medicine

## 2008-01-11 ENCOUNTER — Emergency Department (HOSPITAL_COMMUNITY): Admission: EM | Admit: 2008-01-11 | Discharge: 2008-01-11 | Payer: Self-pay | Admitting: Family Medicine

## 2009-09-10 ENCOUNTER — Emergency Department (HOSPITAL_COMMUNITY): Admission: EM | Admit: 2009-09-10 | Discharge: 2009-09-11 | Payer: Self-pay | Admitting: Emergency Medicine

## 2009-09-10 IMAGING — CR DG CHEST 1V PORT
1 series · 1 of 1 positions shown · non-contrast
Comparison: Chest radiograph performed [DATE]

CLINICAL DATA: Chest pain; history of smoking.

PORTABLE CHEST - 1 VIEW

[view not recorded]
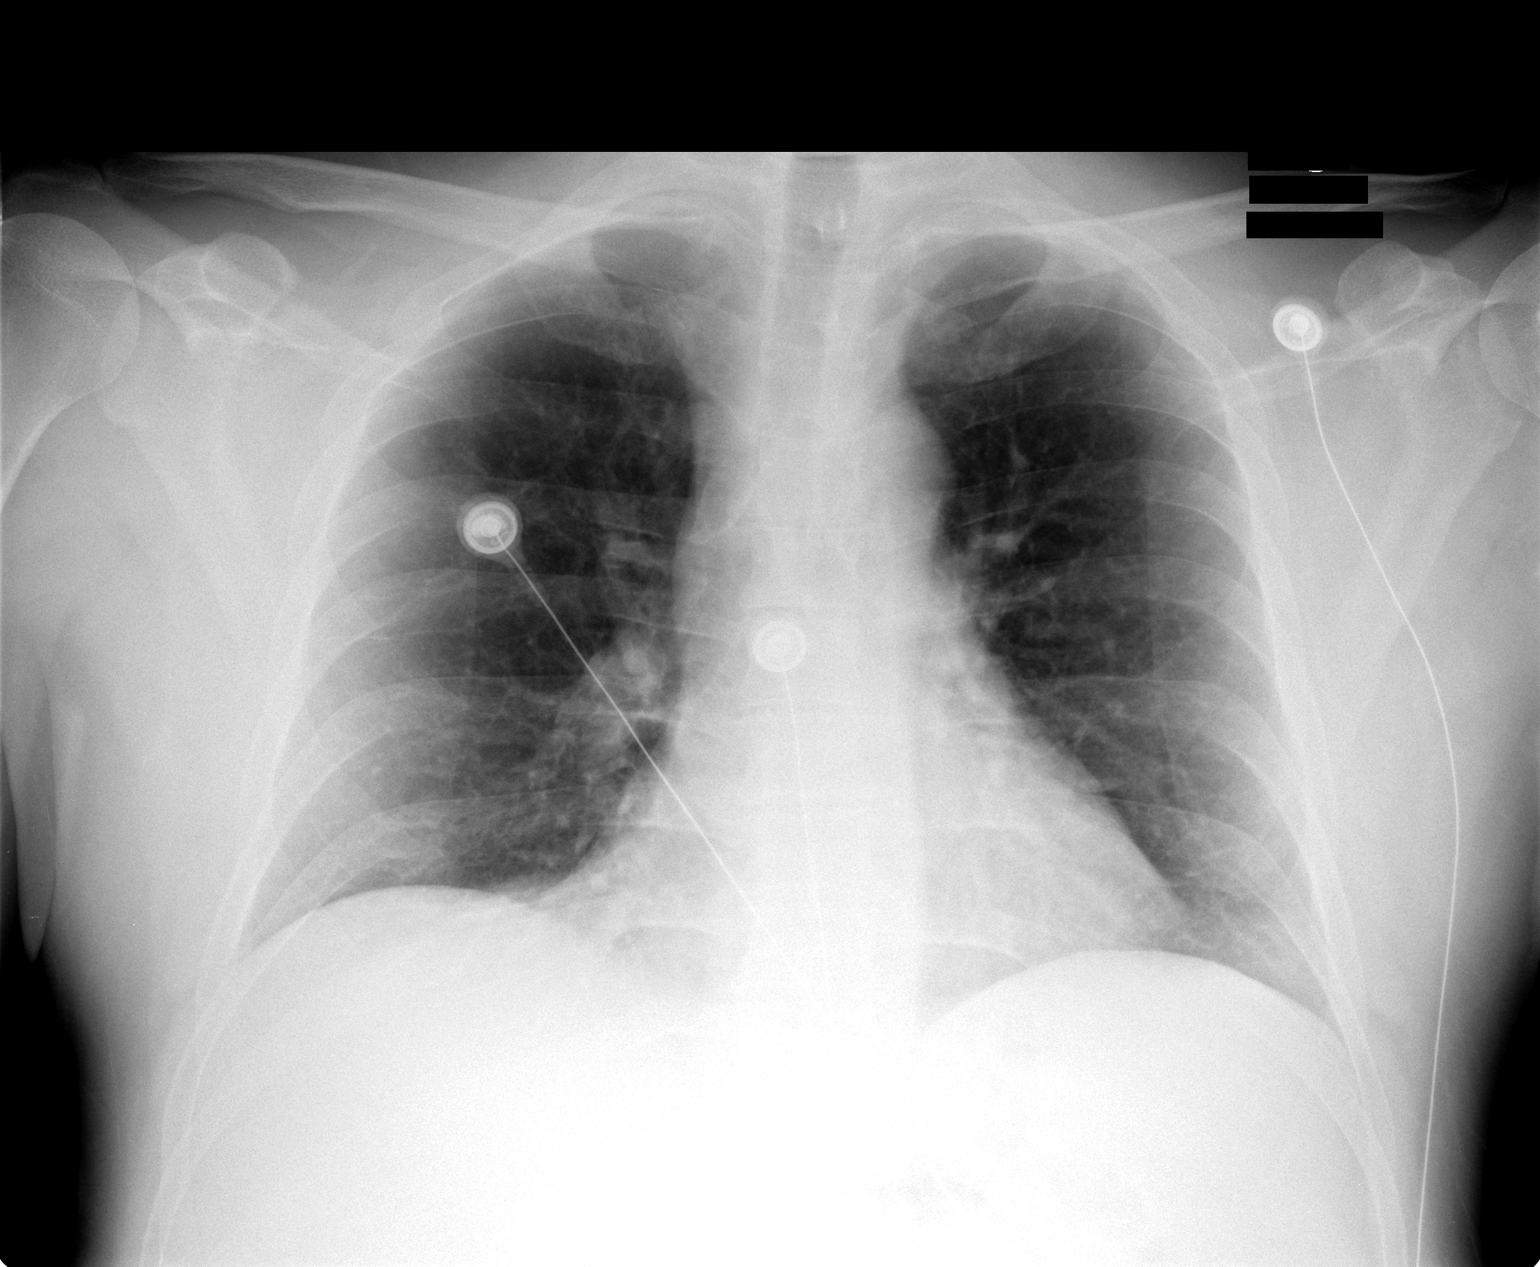

[1 of 1 positions shown; findings below may reference images not displayed]

FINDINGS: The lungs are mildly hypoexpanded but appear clear.
There is no evidence of focal opacification, pleural effusion or
pneumothorax.

The cardiomediastinal silhouette is within normal limits.  No acute
osseous abnormalities are seen.
IMPRESSION: No acute cardiopulmonary process seen.  No displaced rib fractures
identified.

## 2010-03-14 ENCOUNTER — Emergency Department (HOSPITAL_BASED_OUTPATIENT_CLINIC_OR_DEPARTMENT_OTHER): Admission: EM | Admit: 2010-03-14 | Discharge: 2010-03-14 | Payer: Self-pay | Admitting: Emergency Medicine

## 2010-03-14 ENCOUNTER — Ambulatory Visit: Payer: Self-pay | Admitting: Diagnostic Radiology

## 2010-03-14 IMAGING — CR DG TIBIA/FIBULA 2V*R*
4 series · 4 of 4 positions shown · non-contrast
Comparison: None.

CLINICAL DATA: Baseball injury

RIGHT TIBIA AND FIBULA - 2 VIEW

[t tib/fib ap right (1 of 2)]
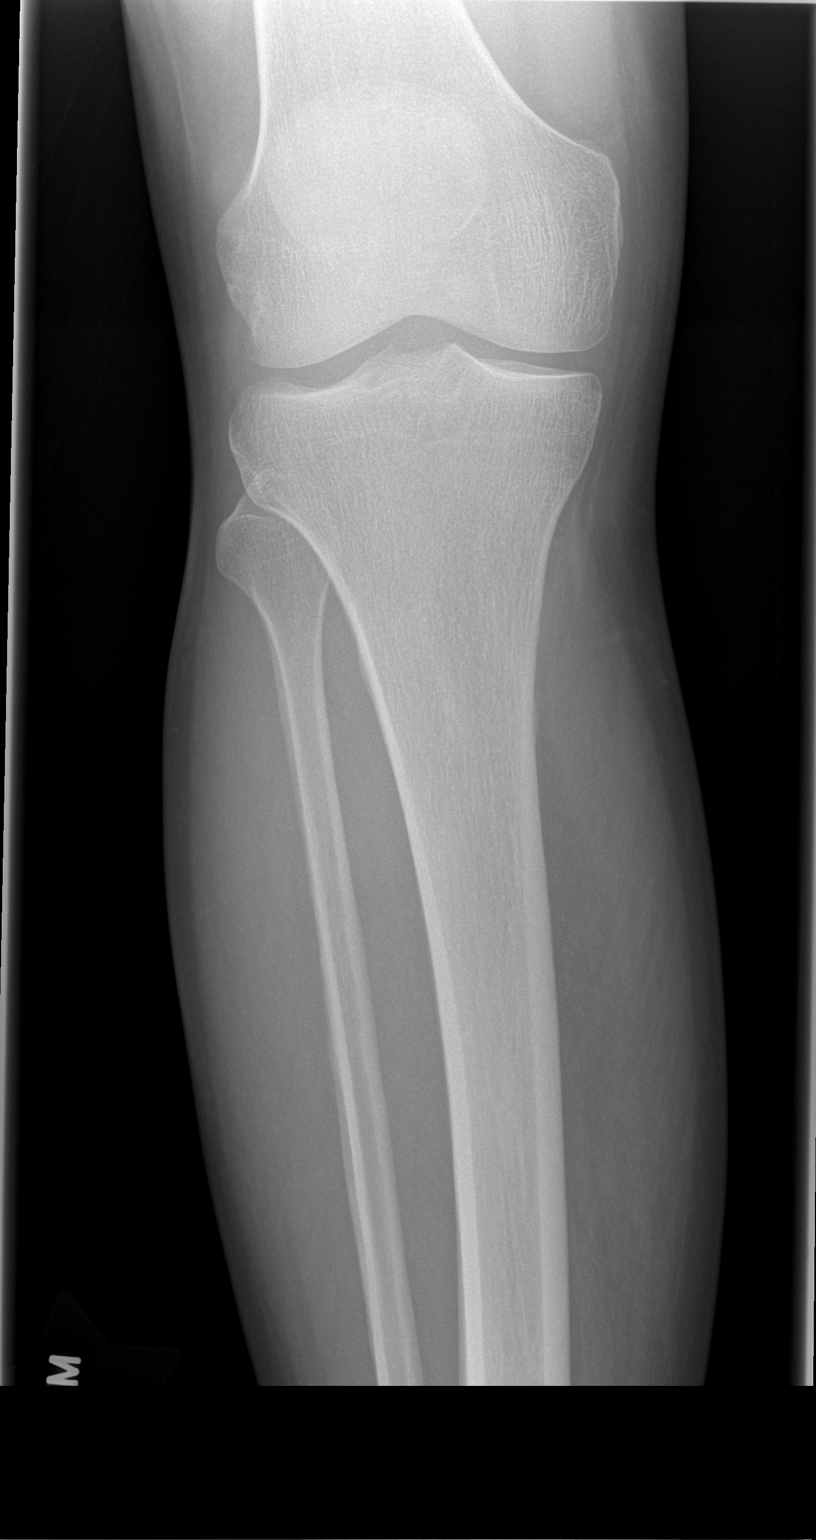

[t tib/fib ap right (2 of 2)]
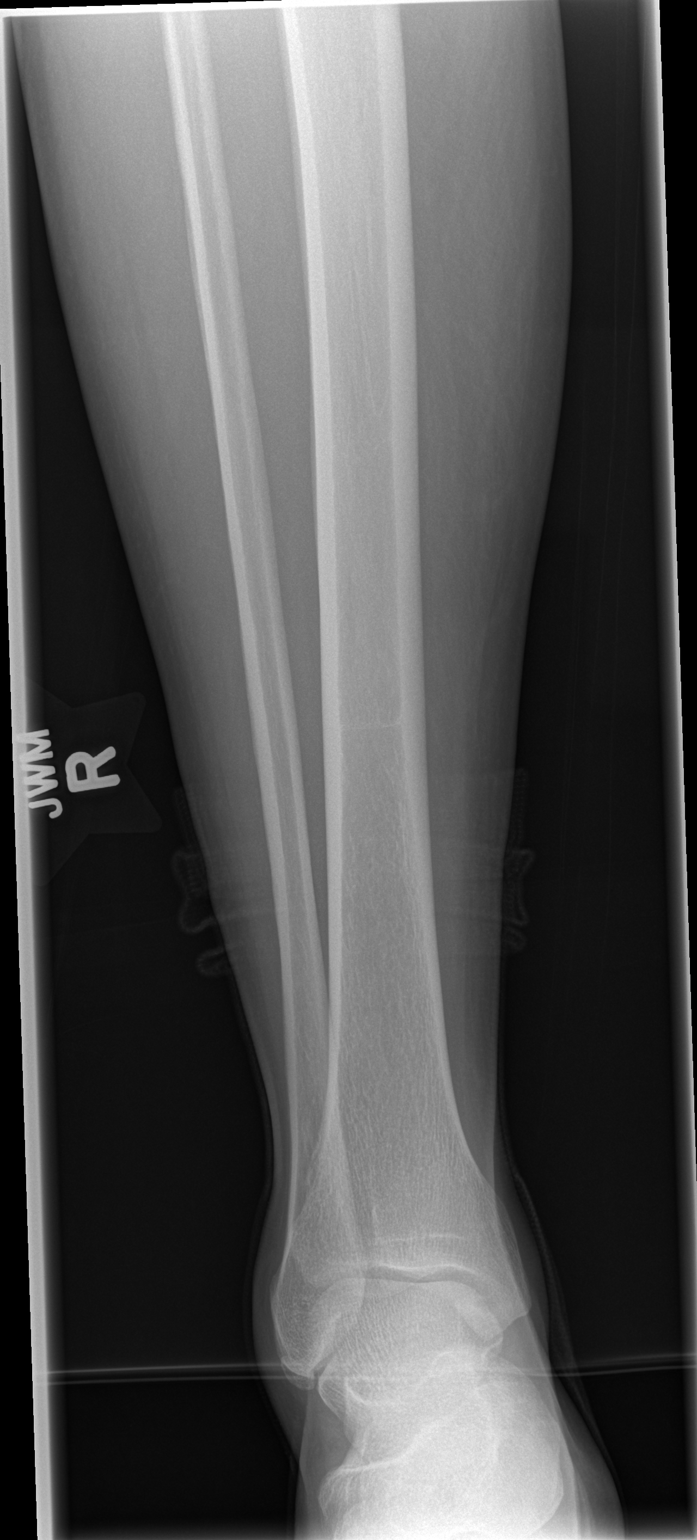

[t tib/fib lat right (1 of 2)]
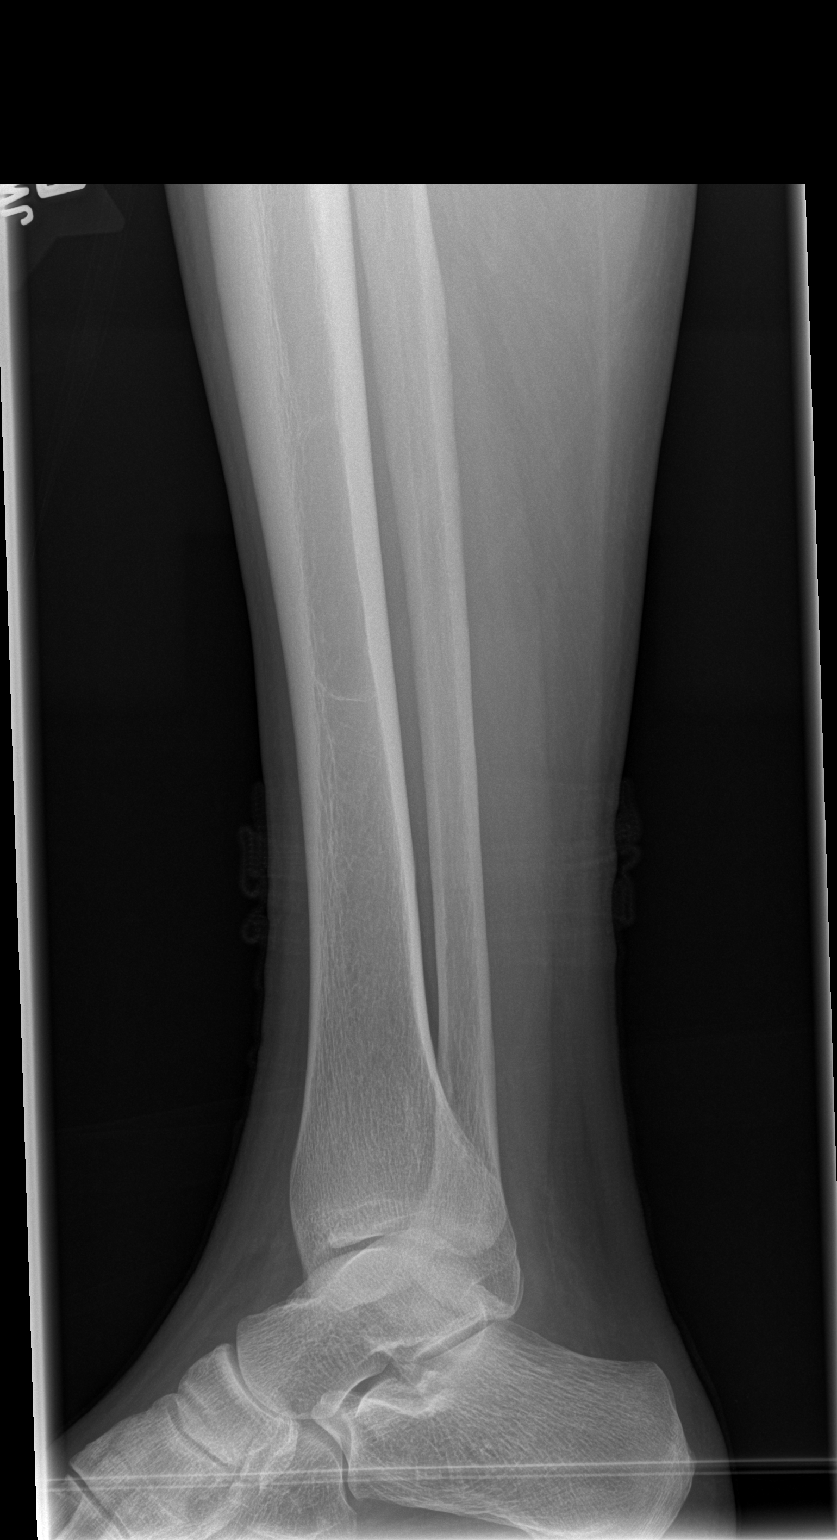

[t tib/fib lat right (2 of 2)]
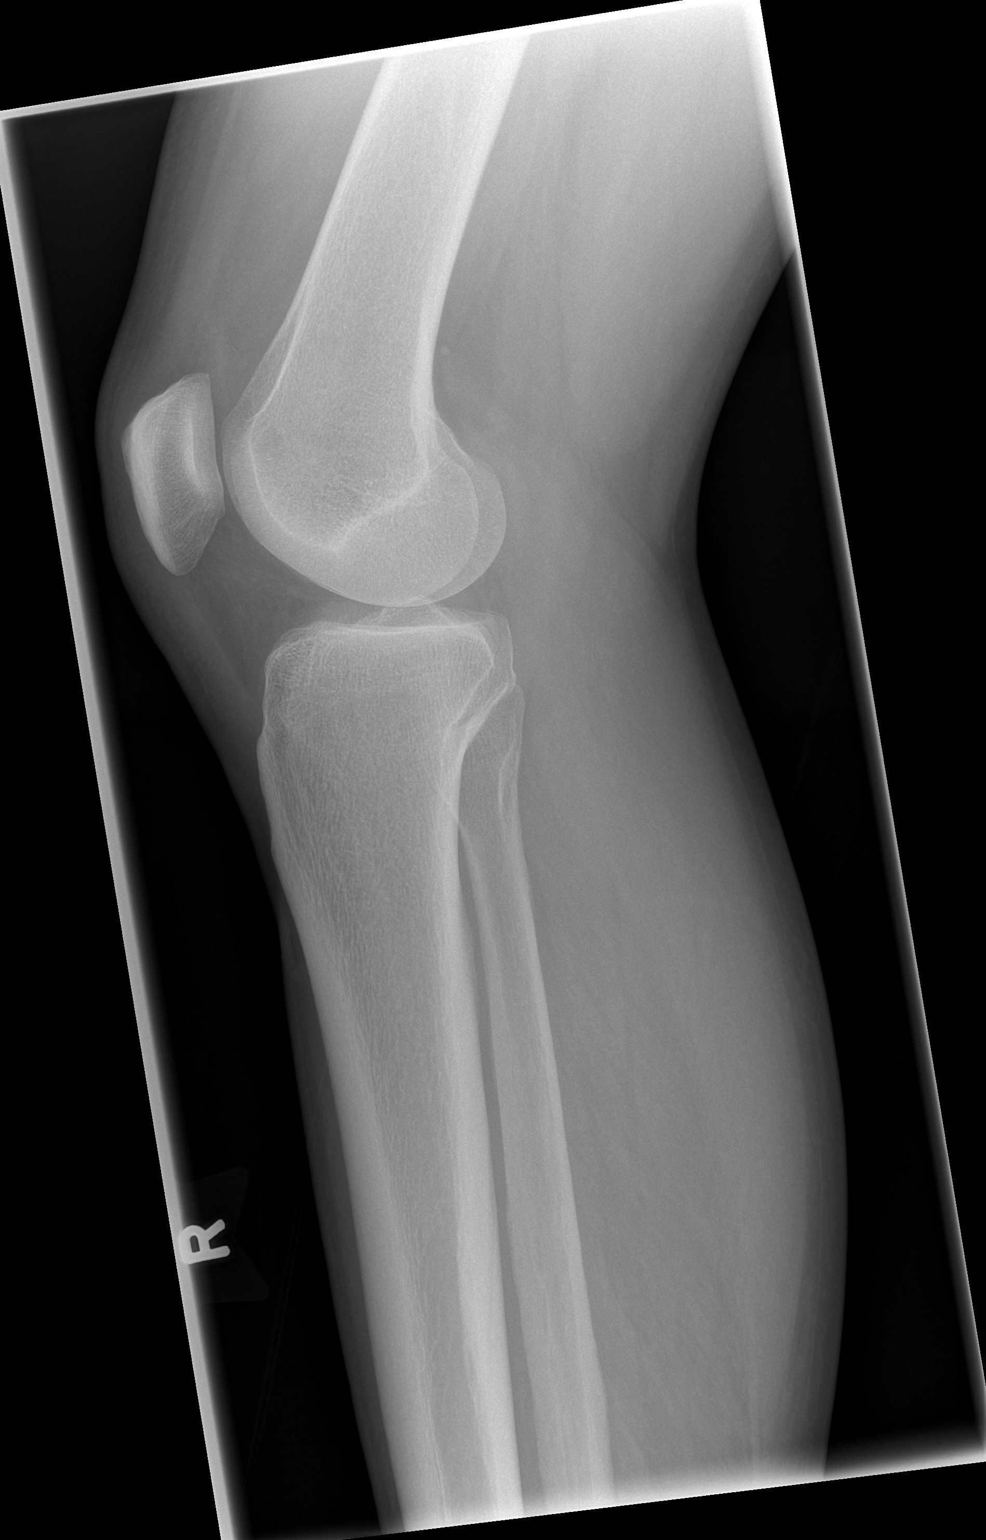

[4 of 4 positions shown; findings below may reference images not displayed]

FINDINGS: There is no evidence of fracture or dislocation.  There
is no evidence of arthropathy or other focal bone abnormality.
Soft tissues are unremarkable.
IMPRESSION: No acute findings.

## 2010-03-14 IMAGING — CR DG LUMBAR SPINE COMPLETE 4+V
5 series · 5 of 5 positions shown · non-contrast
Comparison: None

CLINICAL DATA: Low back pain

LUMBAR SPINE - COMPLETE 4+ VIEW

[t l-spine a.p.]
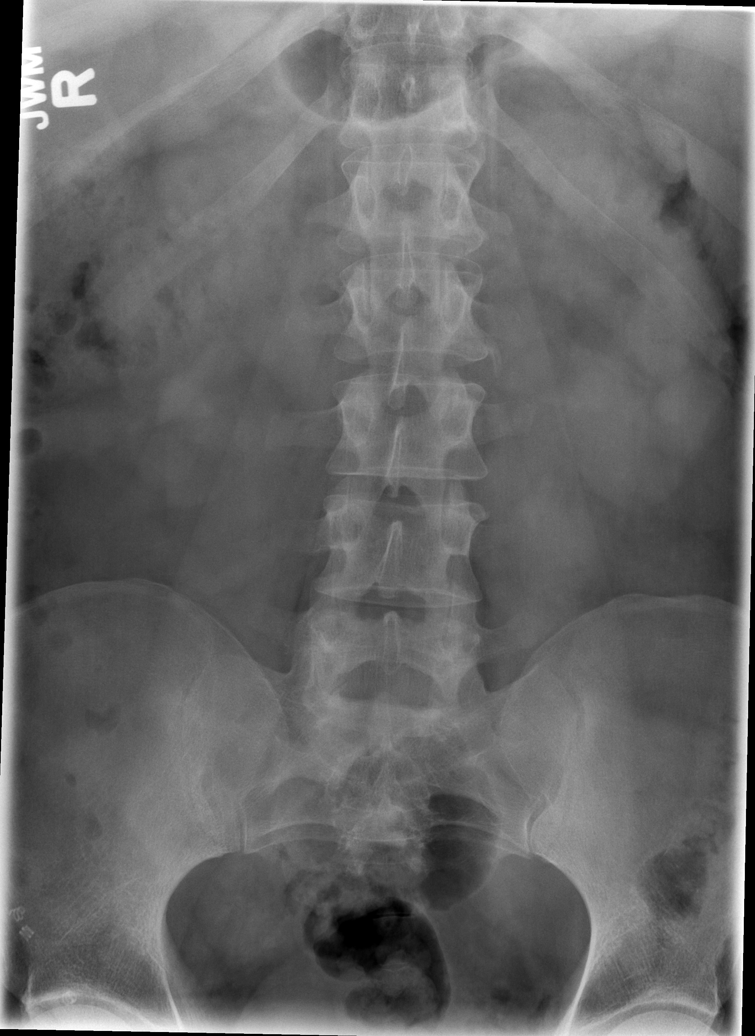

[t l-spine oblique exposure (1 of 2)]
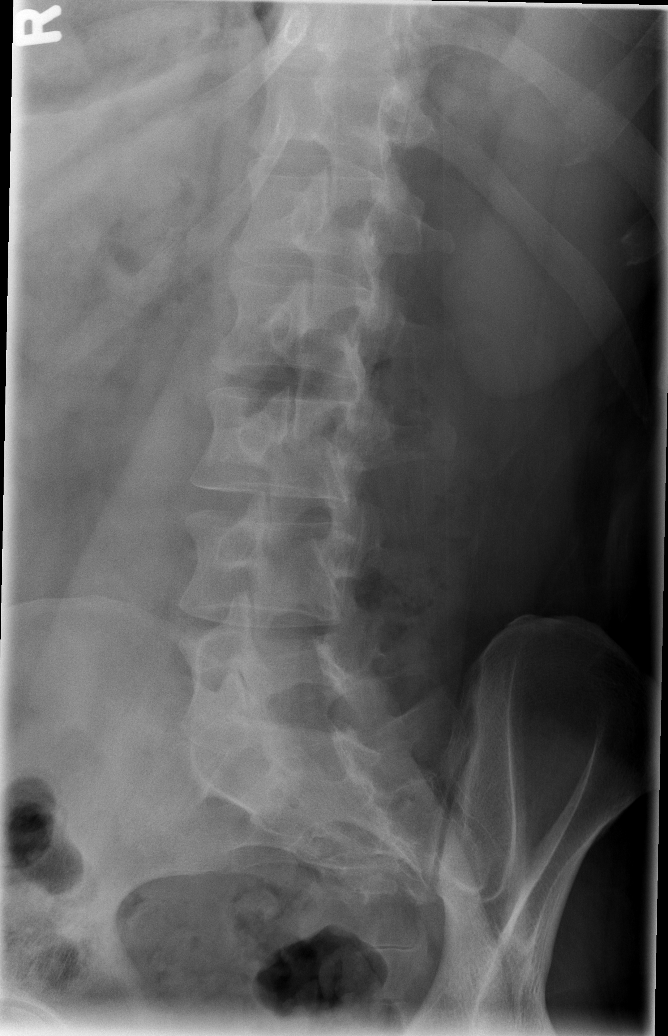

[t l-spine oblique exposure (2 of 2)]
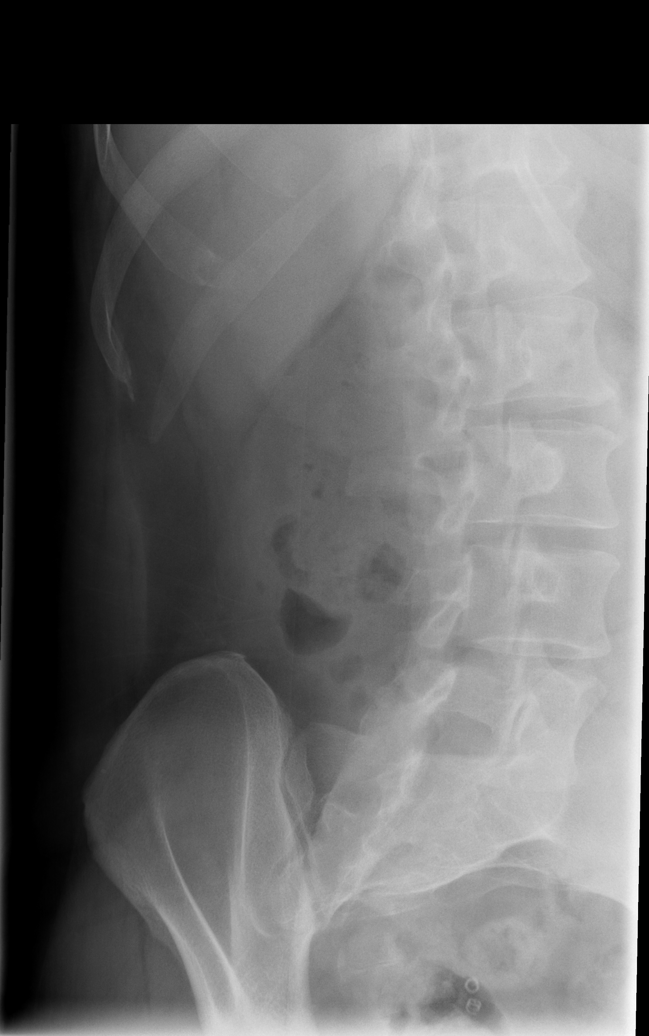

[t l-spine lat]
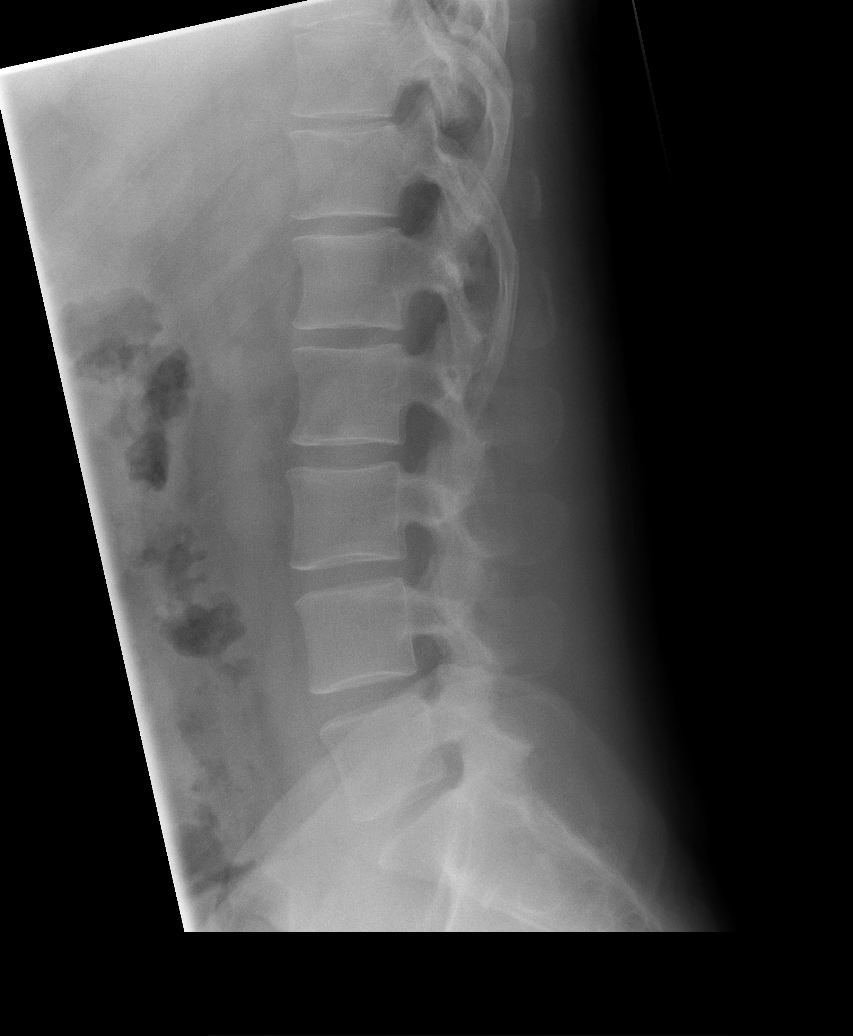

[t l-spine l5-s1 spot]
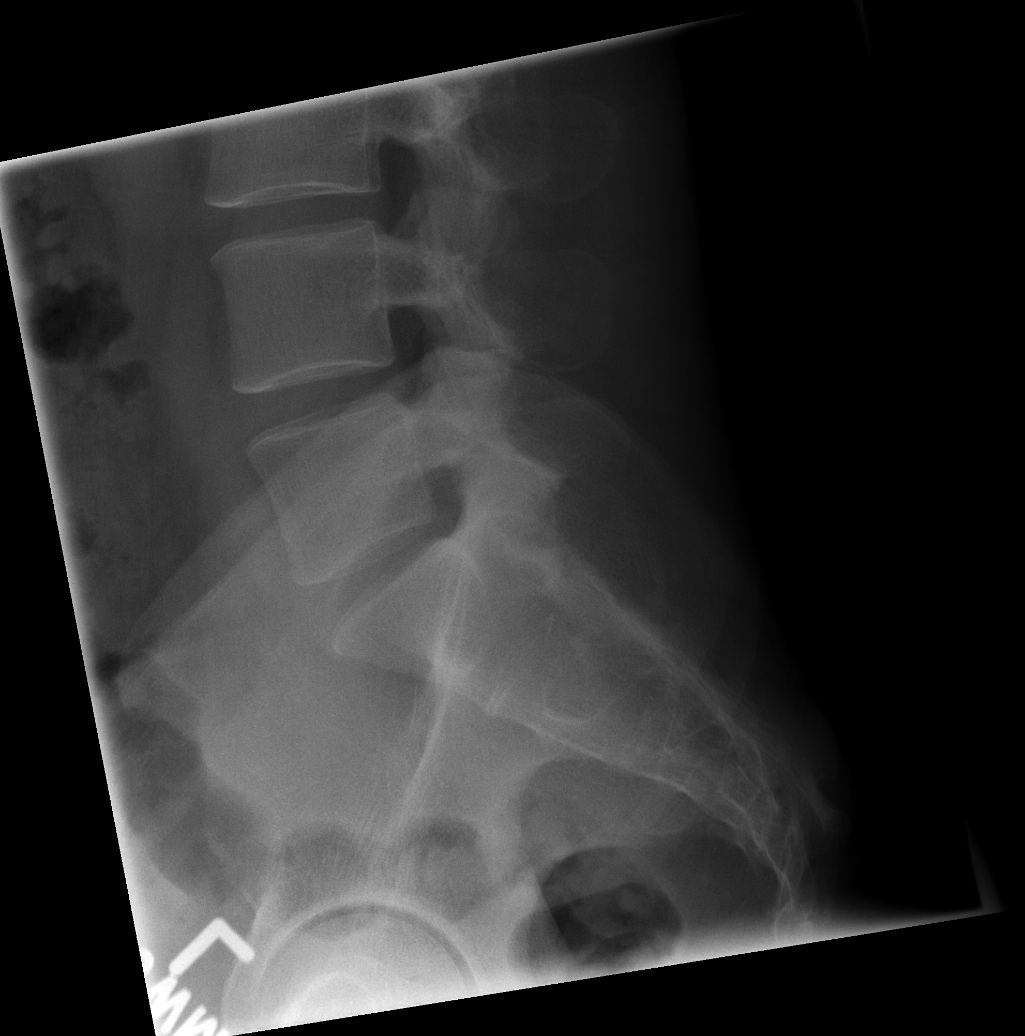

[5 of 5 positions shown; findings below may reference images not displayed]

FINDINGS: There is no evidence of lumbar spine fracture.  Alignment
is normal.  Intervertebral disc spaces are maintained.
IMPRESSION: Normal exam

## 2010-06-10 ENCOUNTER — Encounter: Admission: RE | Admit: 2010-06-10 | Discharge: 2010-06-10 | Payer: Self-pay | Admitting: Family Medicine

## 2010-06-10 IMAGING — US US ABDOMEN COMPLETE
1 series · 13 of 25 positions shown · non-contrast
Comparison: None

CLINICAL DATA: 39-year-old with elevated liver function studies.

COMPLETE ABDOMINAL ULTRASOUND

[Series 1: us abdomen complete · 0.32mm/px · 13 of 70 slices shown]
[im 1/70]
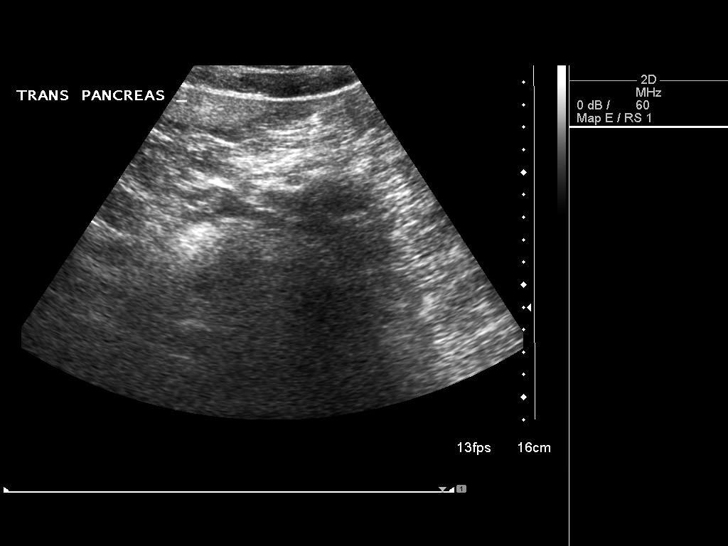
[im 6/70]
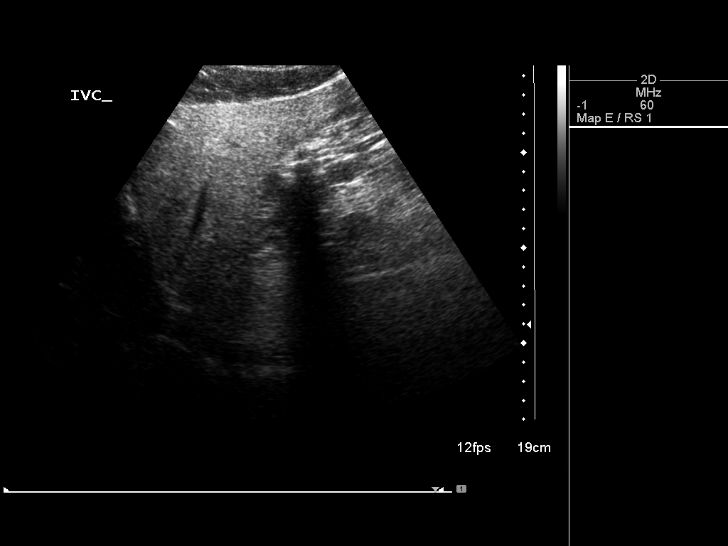
[im 12/70]
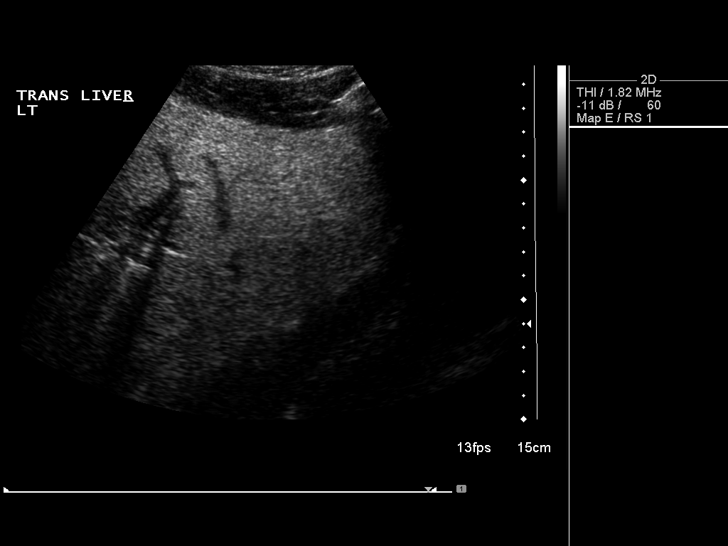
[im 18/70]
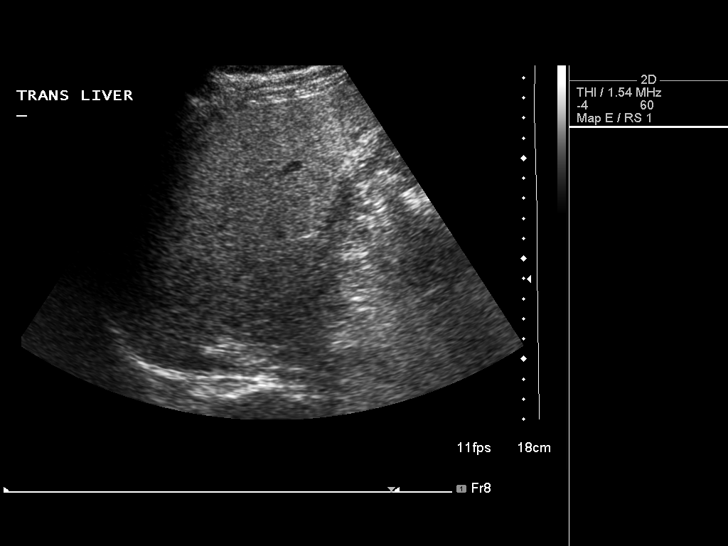
[im 24/70]
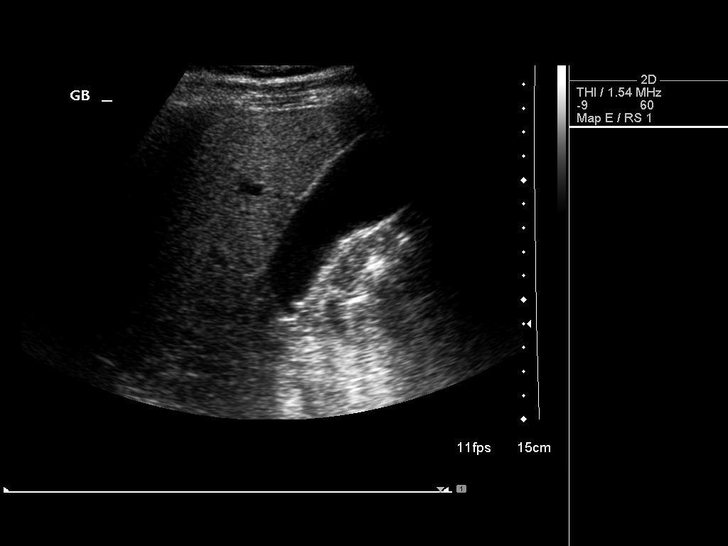
[im 29/70]
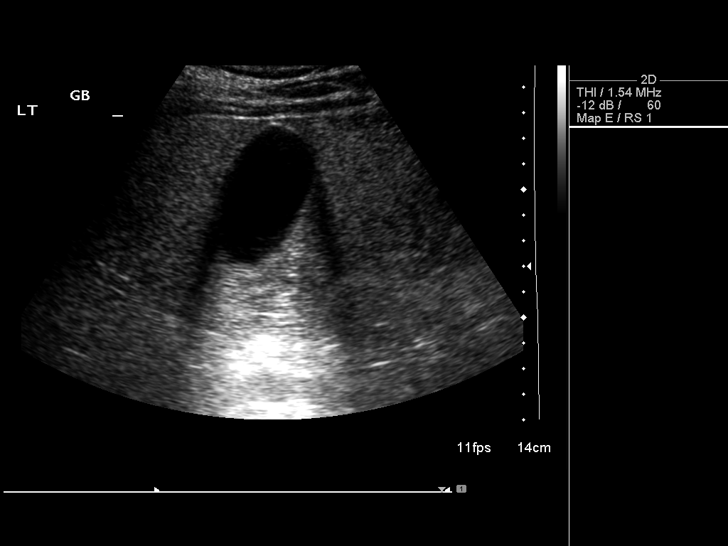
[im 35/70]
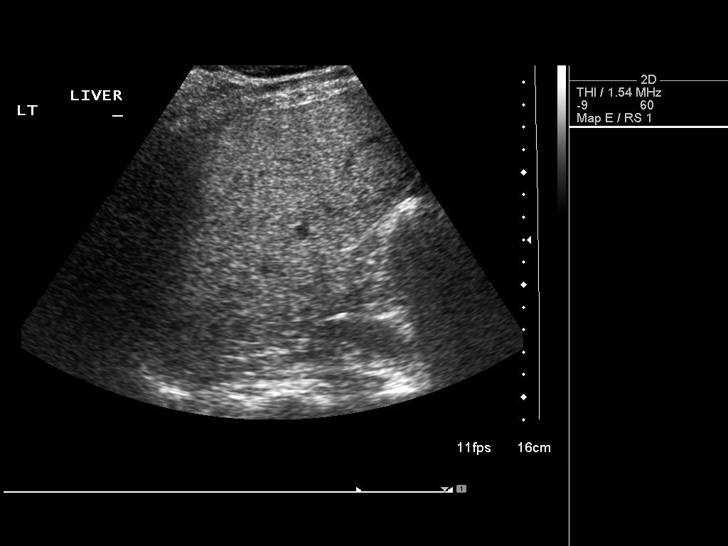
[im 41/70]
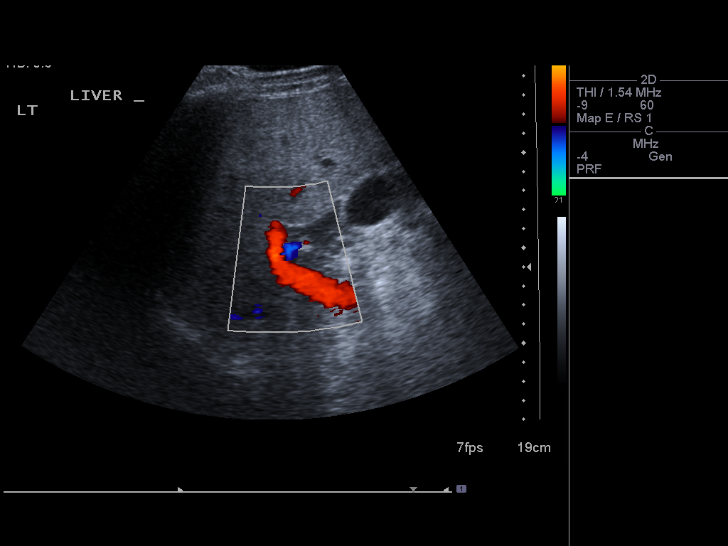
[im 47/70]
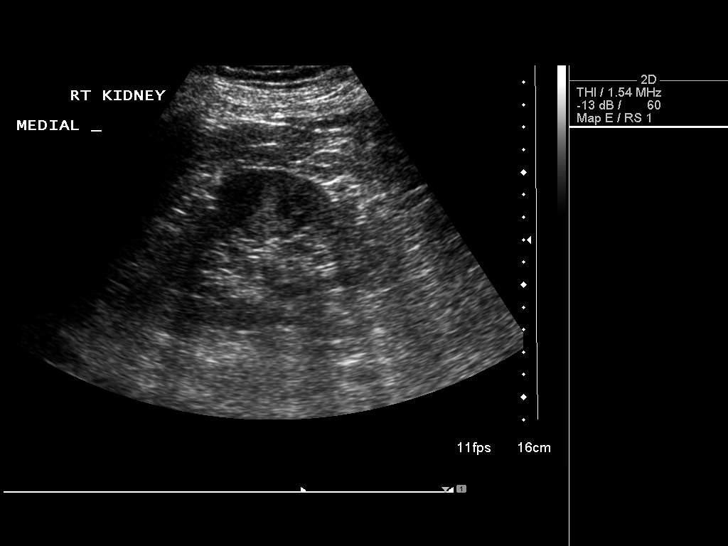
[im 52/70]
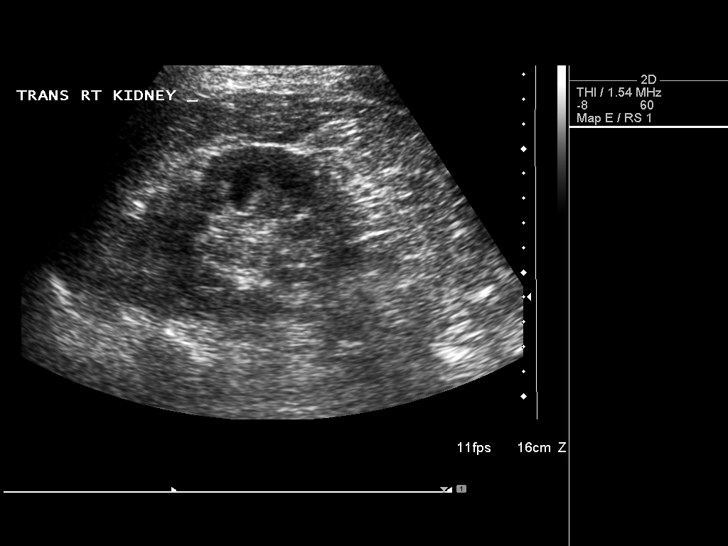
[im 58/70]
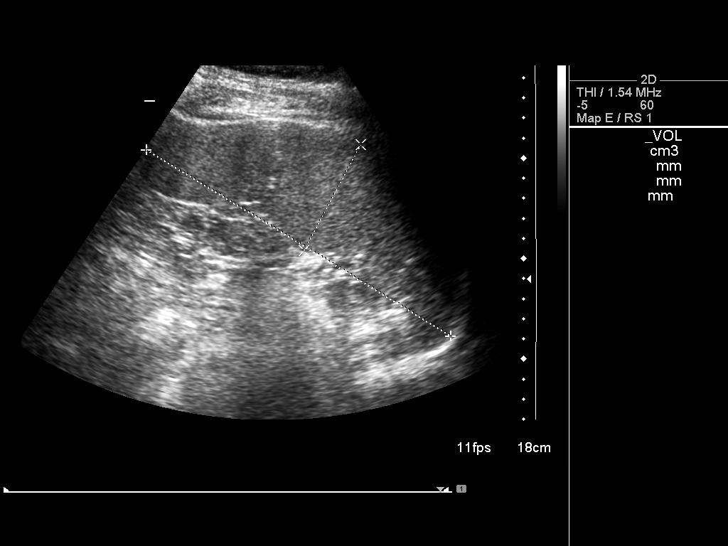
[im 64/70]
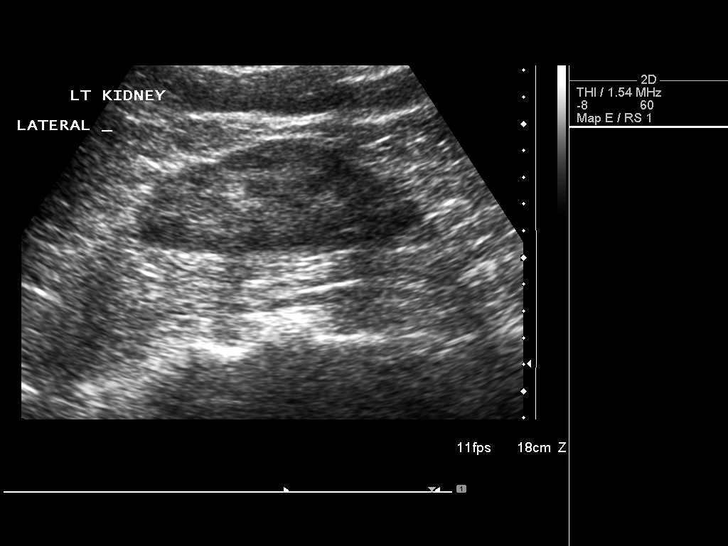
[im 70/70]
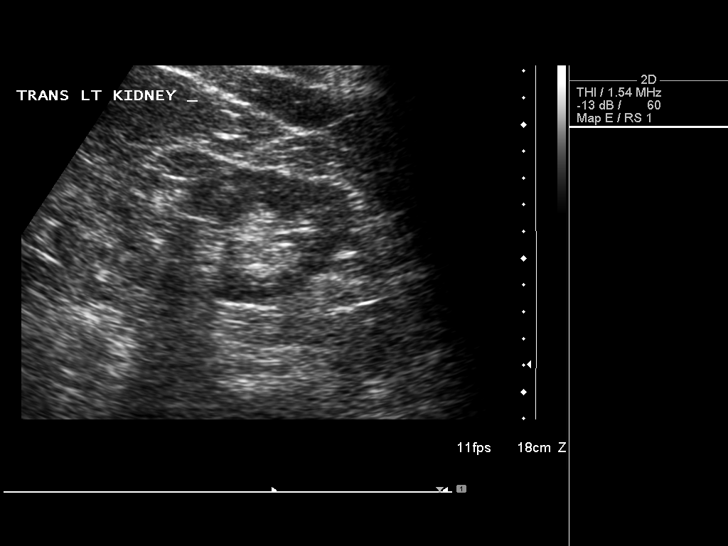

[13 of 25 positions shown; findings below may reference images not displayed]

FINDINGS: Gallbladder: Well distended without wall thickening, stones or
pericholecystic fluid.

Common bile duct:   Normal in caliber without filling defects.

Liver:  The hepatic echogenicity is diffusely increased.  Adjacent
to the gallbladder, there is ill-defined decreased echogenicity,
most likely secondary to focal sparing of fatty infiltration.  No
suspicious liver lesions are identified.

IVC:  Visualized portions appear unremarkable.

Pancreas:  Visualized portions appear unremarkable.

Spleen:  The spleen is enlarged with a length of 15.5 cm and an
estimated volume of 861 ml.  No focal splenic lesions are
identified.

Right Kidney:   The renal cortical thickness and echogenicity are
preserved.  There is no hydronephrosis or focal abnormality. Renal
length is 12.2 cm.

Left Kidney:   The renal cortical thickness and echogenicity are
preserved.  There is no hydronephrosis or focal abnormality. Renal
length is 12.7 cm.

Abdominal aorta:  Visualized portions appear unremarkable.
IMPRESSION: 1.  Mild splenomegaly without demonstrated focal abnormality.
2.  Increased hepatic echogenicity most consistent with steatosis.
There is probable sparing adjacent to the gallbladder.
3.  No biliary dilatation or gallbladder disease identified.

## 2010-08-26 ENCOUNTER — Encounter: Admission: RE | Admit: 2010-08-26 | Discharge: 2010-08-26 | Payer: Self-pay | Admitting: Family Medicine

## 2010-08-26 IMAGING — CR DG LUMBAR SPINE 2-3V
3 series · 3 of 3 positions shown · non-contrast
Comparison: [DATE]

CLINICAL DATA: Low back pain.

LUMBAR SPINE - 2-3 VIEW

[t l-spine a.p.]
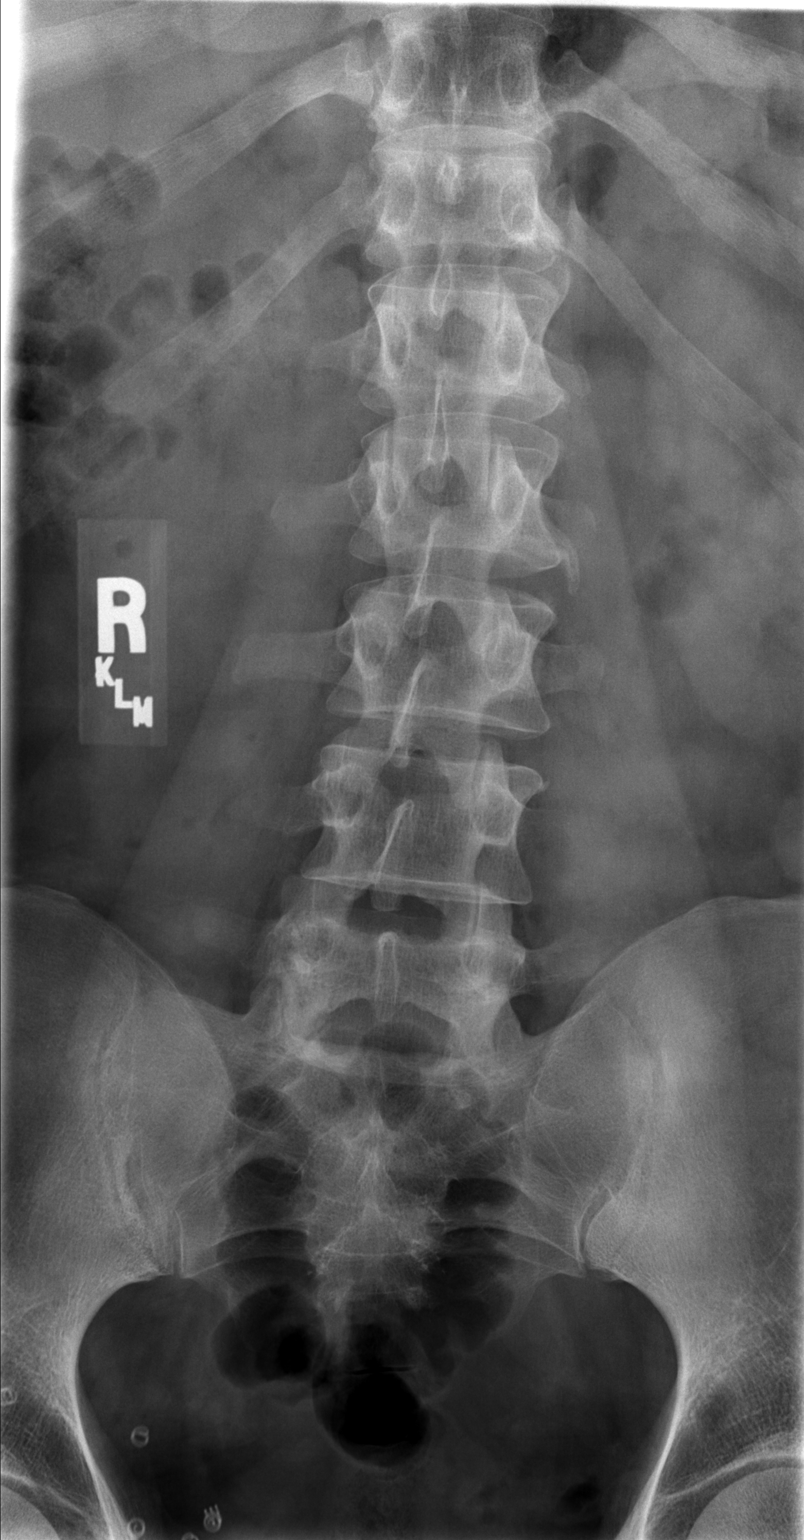

[t l-spine lat]
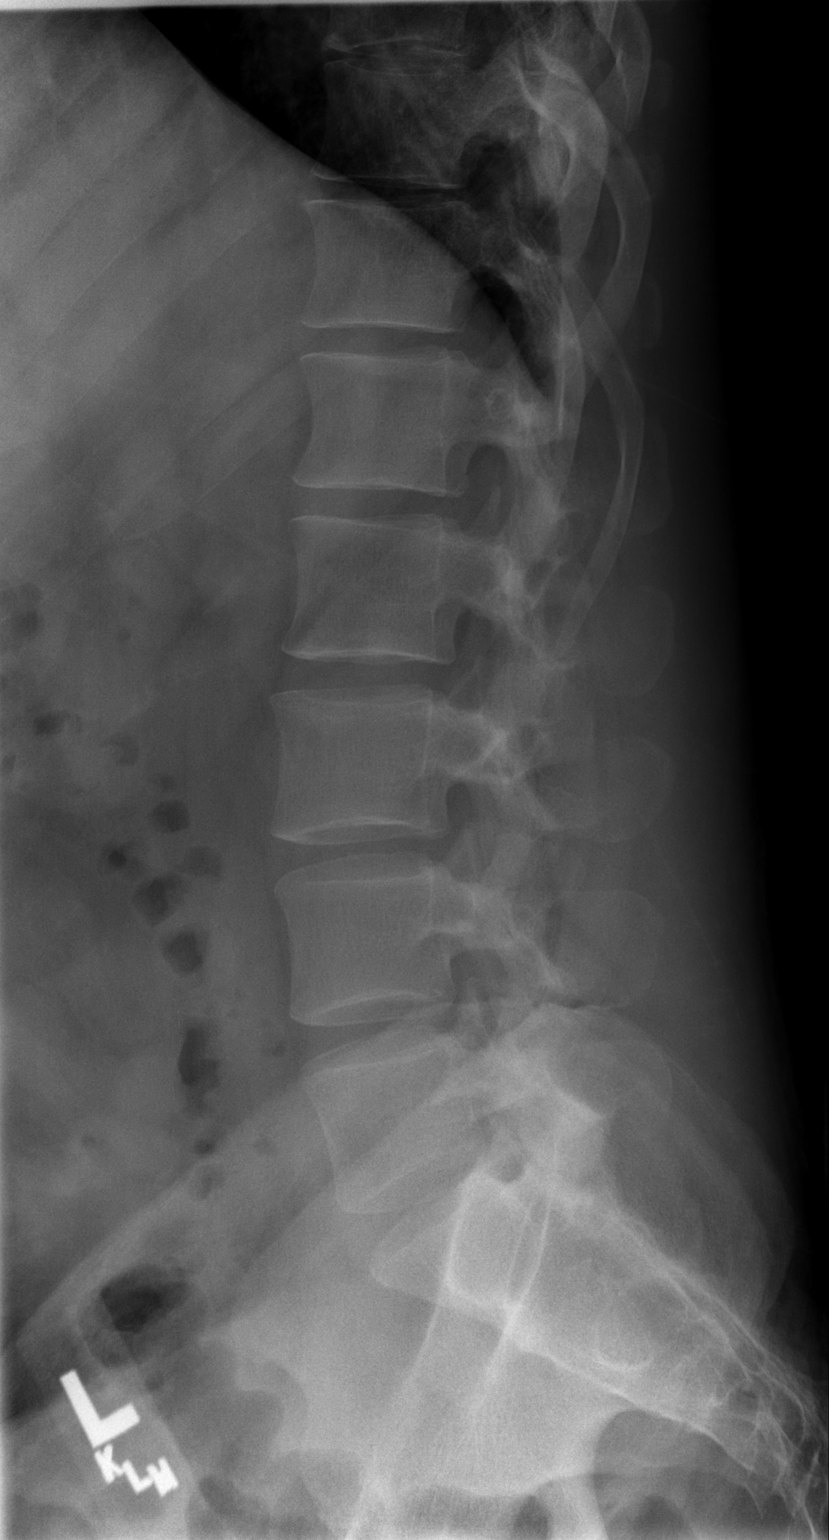

[t l-spine l5-s1 spot]
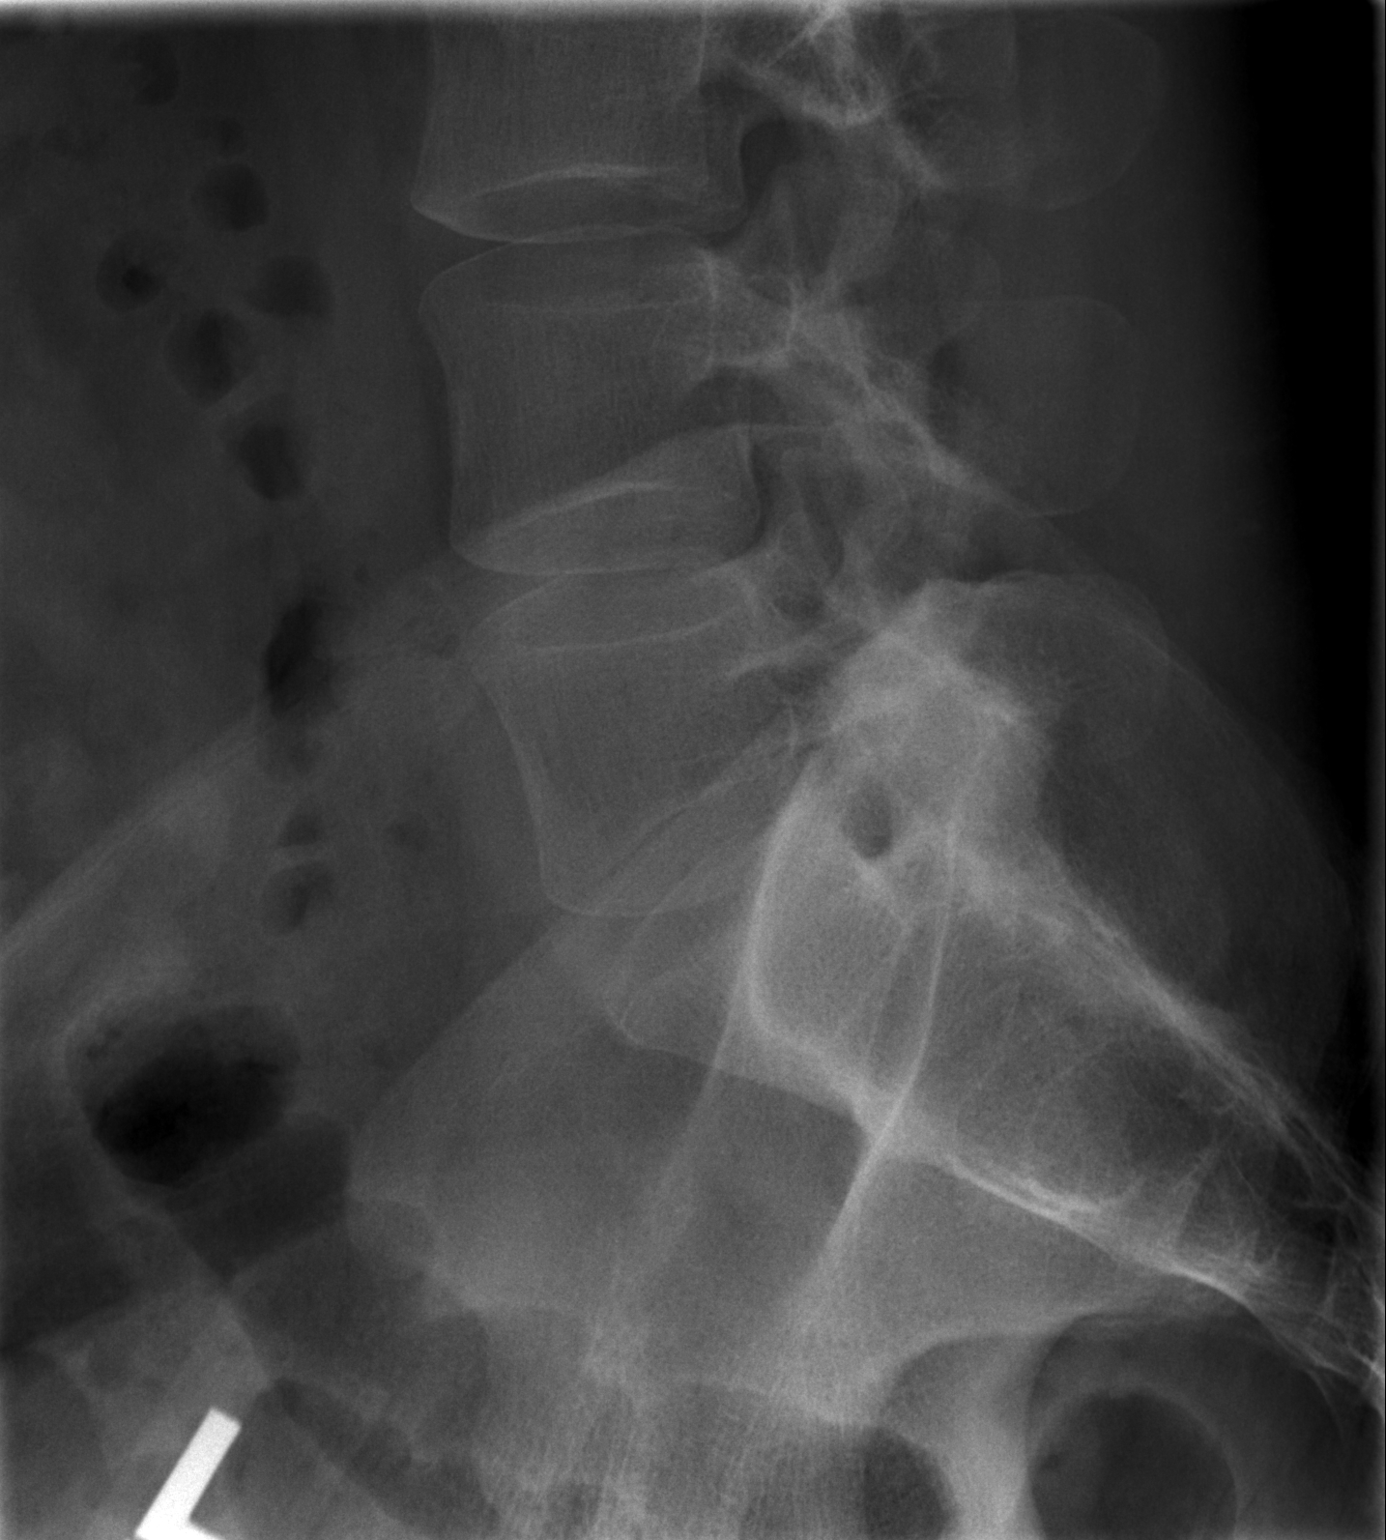

[3 of 3 positions shown; findings below may reference images not displayed]

FINDINGS: There are five lumbar-type vertebral bodies.  No fracture
or malalignment.  Disc spaces well maintained.  SI joints are
symmetric.
IMPRESSION: No bony abnormality.

## 2011-02-27 LAB — COMPREHENSIVE METABOLIC PANEL
AST: 60 U/L — ABNORMAL HIGH (ref 0–37)
Albumin: 4.6 g/dL (ref 3.5–5.2)
Chloride: 105 mEq/L (ref 96–112)
Creatinine, Ser: 0.98 mg/dL (ref 0.4–1.5)
GFR calc non Af Amer: >60 mL/min (ref >60)
Glucose, Bld: 103 mg/dL — ABNORMAL HIGH (ref 70–99)
Total Protein: 7.5 g/dL (ref 6.0–8.3)

## 2011-02-27 LAB — CBC
Hemoglobin: 16.6 g/dL (ref 13.0–17.0)
MCV: 84.1 fL (ref 78.0–100.0)
Platelets: 125 10*3/uL — ABNORMAL LOW (ref 150–400)
RBC: 5.62 MIL/uL (ref 4.22–5.81)
RDW: 13.6 % (ref 11.5–15.5)
WBC: 7 10*3/uL (ref 4.0–10.5)

## 2011-02-27 LAB — TROPONIN I: Troponin I: 0.01 ng/mL (ref 0.00–0.06)

## 2011-04-11 NOTE — Op Note (Signed)
NAME:  Alan Sanders, DONOGHUE NO.:  [id number]   MEDICAL RECORD NO.:  [id number]          PATIENT TYPE:  AMB   LOCATION:  DAY                          FACILITY:  [facility]   PHYSICIAN:  Bertram Millard. Dahlstedt, M.D.DATE OF BIRTH:  1971/02/07   DATE OF PROCEDURE:  06/12/2006  DATE OF DISCHARGE:                                 OPERATIVE REPORT   PRE AND POSTOPERATIVE DIAGNOSIS:  Interstitial cystitis.   PROCEDURE:  Cysto, hydro overdistention of the bladder, instillation of  Pyridium and Marcaine.   SURGEON:  Bertram Millard. Dahlstedt, M.D.   ANESTHESIA:  General.   COMPLICATIONS:  None.   BRIEF HISTORY:  40 year old male who underwent cysto and  H O D several  years ago for significant painful bladder symptoms.  He was diagnosed at  that time with IC.  It helped his symptoms significantly.  Over the past few  months he has had increasing pain with his bladder, increasing lower urinary  tract symptoms, and recently presented to my office.  He was found to have a  clear urine.   The patient presents at this time for H O D.  He is aware of risks and  complications and desires to proceed.   DESCRIPTION OF PROCEDURE:  The patient was identified in the holding area,  and administered preoperative IV antibiotics and taken to the operating room  where general anesthetic was administered using LMA.  Placed in dorsal  lithotomy position, genitalia and perineum were prepped and draped.  A 22-  French panendoscope was passed directly through his urethra which was  normal.  Prostate was not obstructed.  Bladder was entered and inspected  circumferentially.  There were minimal trabeculations.  No foreign bodies,  no tumors.  Ureteral orifices were normal in configuration and location.  For 10 minutes the bladder was filled to capacity with the irrigating fluid  800 mm above the patient's bladder.  In the bladder was then decompressed.  Capacity was 750 mL.  There were moderate  glomerulations and slight amount  of bleeding from some of these.  The bladder was then again drained, and  then 15 mL of solution of 1/2% plain Marcaine and Pyridium were instilled.  The patient tolerated procedure well.  Awakened, taken to PACU in stable  condition.  He was also administered Toradol intraoperatively.   He will be discharged home on Percocet 5/325 one to two p.o. q.4 h p.r.n.  pain (#30), Flomax 0.4 mg daily x 10 and Macrobid one p.o. b.i.d. times 5  days.  He will follow-up in the office in 2 weeks.      Bertram Millard. Dahlstedt, M.D.  Electronically Signed     SMD/MEDQ  D:  06/12/2006  T:  06/12/2006  Job:  [id number]

## 2011-04-11 NOTE — Op Note (Signed)
NAME:  [name], Alan Sanders                         ACCOUNT NO.:  [id number]   MEDICAL RECORD NO.:  [id number]                   PATIENT TYPE:  AMB   LOCATION:  [facility]                                 FACILITY:  [facility]   PHYSICIAN:  Bertram Millard. Dahlstedt, M.D.          DATE OF BIRTH:  12/23/1970   DATE OF PROCEDURE:  02/02/2004  DATE OF DISCHARGE:                                 OPERATIVE REPORT   PREOPERATIVE DIAGNOSIS:  Painful bladder with frequency.   POSTOPERATIVE DIAGNOSIS:  Painful bladder with frequency.   OPERATION/PROCEDURE:  1. Cystoscopy.  2. Bladder biopsy.  3. Hydrodistention.   SURGEON:  Bertram Millard. Dahlstedt, M.D.   ANESTHESIA:  General with LMA.   COMPLICATIONS:  None.   BRIEF HISTORY:  A 40 year old male with worsening symptoms of urinary  frequency, urgency, pelvic and bladder pain.  These have been worsening over  the past few weeks.  He has no history of gross hematuria.  His symptoms did  not get better with typical treatment of prostatitis.   At this point, having failed conservative management, the patient presents  for cystoscopy and hydrodistention of his bladder.  He is aware of risks and  complications and desires to proceed.   DESCRIPTION OF PROCEDURE:  The patient was administered preoperative IV  antibiotics and taken to the operating room where general anesthetic was  administered using the LMA.  He is placed in the dorsal lithotomy position.  Genitalia and perineum were prepped and draped.  A 22-French panendoscope  was passed through his urethra which is normal.  Bladder was inspected  circumferentially.  There was some mild erythematous mucosa at the bladder  neck in the midline at the trigone.  I did not see any specific lesions here  but the vessels were more prominent.  No other bladder lesions were seen.  At this point the bladder was filled to capacity with 800 mm above the  patient's body level.  It was filled for five minutes.  At this point  the  bladder was drained.  Cystoscopy revealed glomerulations throughout the  bladder.  Photos were taken.   A separate biopsy was taken of the area in the trigone and labeled  trigone.  At this point, the biopsied site was cauterized.  The bladder  was then drained and the scope removed.  Plain Marcaine, 15 mL, was  instilled in the  bladder.  At this point the procedure was terminated.  A B&O suppository had  been placed.  The patient was also given Toradol.  He tolerated the  procedure well.  He was transported to the PACU in stable condition.   He will be discharged on Vicodin ES #20 and Levaquin 500 mg one p.o. daily  times three days.  Bertram Millard. Dahlstedt, M.D.    SMD/MEDQ  D:  02/02/2004  T:  02/02/2004  Job:  [id number]

## 2011-04-11 NOTE — Consult Note (Signed)
[facility]  Patient:    Alan Sanders, Alan Sanders. Visit Number: [id number] MRN: [id number]          Service Type: Attending:  Bertram Millard. Dahlstedt, M.D. Dictated by:   Bertram Millard. Dahlstedt, M.D. Proc. Date: 08/22/01                            Consultation Report  REASON FOR CONSULTATION:  Left testicular pain.  BRIEF HISTORY:  This 40 year old male presented to the [facility] early in the morning of August 22, 2001, with a four-hour history of left testicular pain.  This gets worse with him walking around. The pain has been persistent.  No specific exacerbating or relieving activities.  He has no right-sided pain.  Evaluation in the ER revealed a high-riding testicle on the left.  Urinalysis was clear.  He was taken to the ultrasound scanner where Doppler revealed normal testicles and slight increased flow over the left epididymis, but basically normal flow bilaterally.  A urological consultation was requested.  PHYSICAL EXAMINATION:  A sleeping 40 year old male.  Up upright exam, the phallus was normal and circumcised without lesions.  Meatus normal in location and size.  Glans normal.  The scrotal skin was unremarkable.  The left testicle was somewhat high riding compared to the right.  Epididymes were posterior bilaterally.  The testicles were normal in size.  There was minimal left testicular and epididymal tenderness.  Cremasteric reflexes were normal bilaterally.  The left cord was nontender and nonthickened.  There was no inguinal hernia palpable, but there was some tenderness along the left cord.  IMPRESSION:  Spontaneous detorsion versus early epididymitis versus musculoskeletal pain.  PLAN: 1. Recommend that the patient give me a call if symptoms get worse in the next    few hours or days. 2. Will follow up the patient this week in the office. 3. Doxycycline 100 mg one p.o. b.i.d. x 1 week. 4. Ibuprofen 800 mg one  p.o. q.8h., #20. Dictated by:   Bertram Millard. Dahlstedt, M.D. Attending:  Bertram Millard. Dahlstedt, M.D. DD:  08/22/01 TD:  08/22/01 Job: [id number]

## 2011-04-11 NOTE — Op Note (Signed)
[facility]  Patient:    Alan Sanders, Alan Sanders. Visit Number: [id number] MRN: [id number]          Service Type: Attending:  Bertram Millard. Dahlstedt, M.D. Proc. Date: 08/22/01                             Operative Report  PREOPERATIVE DIAGNOSIS:  Probable left testicular torsion.  POSTOPERATIVE DIAGNOSIS:  Probable left testicular torsion with spontaneous detorsion.  PROCEDURE PERFORMED:  Bilateral orchidopexy.  SURGEON:  Bertram Millard. Dahlstedt, M.D.  ANESTHESIA:  General endotracheal.  COMPLICATIONS:  None.  INDICATIONS:  A 40 year old male who presented to the emergency room at 0400 on 08/22/01.  At that time, he had been complaining about a four hour history of intense left testicular pain.  He had no herald episodes of this pain.  The patient was felt to have a high riding testicle which was lying transverse in the scrotum.  A testicular scan was performed prior to me seeing the patient. At that time, he had slight increase flow to the left epididymitis, but he had normal flow to each testicle.  I saw the patient and at that time his pain was nonexistent.  It was thought that perhaps the patient had probable torsion versus possible epididymitis, with the torsion spontaneously improving.  The patient went home, but came within an hour and a half with recurrence of his intense left testicular pain, so much that it was as "10/10."  The patient was re-examined and the testicle was quite high riding.  He had lost his premaster reflex.  It was recommended that he undergo bilateral orchidopexy.  Risks and complications of the procedure were discussed with the patient.  We will proceed with that at the present time.  DESCRIPTION OF PROCEDURE:  The patient was administered a general endotracheal anesthetic and placed in the supine position.  The genitalia and perineum were prepped and draped.  A 3 cm incision was made at his anterosuperior scrotum along the  median raphe and carried down to and then through both tunica vaginalis layers with electrocautery.  The cord structures on the left were fairly hyperemic on the left compared to the right.  The epididymis appeared normal.  It was felt that there was probably a detorsion.  No torsion was seen along the left cord.  The appendix testicle was cauterized.  The left testicle was intact to the dartos fascia with three separate 4-0 Prolene sutures in a triangular fashion.  The cord on the left was blocked with 5 cc of 0.5% plain Marcaine.  On the right, the cord structures appeared normal normal.  The epididymis and testicle appeared normal.  The appendix testis was cauterized. The right testicle was tacked to the dartos fashion the same manner as on the left.  The cord was blocked with 5 cc of 0.5% plain Marcaine.  At this point, the wound was closed with dartos fascia reapproximating using a running 3-0 Vicryl.  A 4-0 Dexon was used to run the subcuticular layer.  The patient tolerated the procedure well.  A dry sterile dressing was placed and the patient was taken to the PACU in stable condition. Attending:  Bertram Millard. Dahlstedt, M.D. DD:  08/22/01 TD:  08/22/01 Job: [id number]

## 2011-04-11 NOTE — Procedures (Signed)
[facility]  Patient:    Alan Sanders, Alan Sanders                      MRN: [id number] Adm. Date:  [id number] Attending:  Earline Mayotte R                           Procedure Report  PROCEDURE:  Upper endoscopy.  INDICATIONS:  This 40 year old white male came to the emergency room last night with acute odynophagia.  He gave a history of eating fish and felt that there was a fish bone lodged in his throat.  He was gagging and wretching, but he was able to expectorate his saliva.  In the emergency room, a barium swallow was done, which showed normal esophagus without evidence of foreign body.  He is undergoing upper endoscopy because of persistent symptoms of a foreign body in the back of his throat.  ENDOSCOPE:  Olympus single-channel video endoscope.  SEDATION:  Versed 5 mg IV and Demerol 100 mg IV.  FINDINGS:  The Olympus single-channel video endoscope was passed under direct vision through the posterior pharynx into the esophagus.  The patient was monitored by pulse oximeter.  His oxygen saturations were 89-91%.  He was very uncooperative.  He was completely out of control and had to be held down.  The patient was gagging and wretching through the procedure, which was terminated prematurely.  The patient had to be sedated more and again reintubated with the endoscope.  At this time, we were able to proceed with the exam.  Proximal and distal esophageal mucosa showed marked abrasions and some mucosal hemorrhages.  Some of this may have been caused by trauma from the gagging and wretching.  There were large linear erosions in the distal esophagus, which could represent either a Mallory-Weiss tear or a reflux esophagitis.  Stomach:  The stomach was insufflated with air.  There was no blood in the stomach.  There was some bilious material in the stomach, but the gastric mucosa itself appeared normal, especially on retroflexion of the endoscope. The fundus and  cardia appeared normal.  The gastric antrum and pylorus were unremarkable.  Duodenum:  A brief exam of the duodenal bulb showed normal duodenal bulb and descending duodenum.  The endoscope scope was then brought back in the esophagus and again marked erosions and mucosal abrasions were noted.  There was no obstruction and no stricture.  The patient again was quite uncooperative with the procedure.  IMPRESSION: 1. Esophagitis. 2. Possible Mallory-Weiss tear. 3. Possible reflux esophagitis.  PLAN: 1. Full liquid diet today. 2. Prilosec 200 mg twice a day for a week and then one p.o. q.h.s. for three    weeks. 3. Carafate slurry one tablespoon four times a day for three days. DD:  08/11/00 TD:  08/11/00 Job: [id number]

## 2011-08-06 ENCOUNTER — Emergency Department (HOSPITAL_COMMUNITY)
Admission: EM | Admit: 2011-08-06 | Discharge: 2011-08-07 | Disposition: A | Discharge status: Home / Self Care | Payer: BC Managed Care – PPO | Attending: Emergency Medicine | Admitting: Emergency Medicine

## 2011-08-06 DIAGNOSIS — R1012 Left upper quadrant pain: Secondary | ICD-10-CM | POA: Insufficient documentation

## 2011-08-06 DIAGNOSIS — R112 Nausea with vomiting, unspecified: Secondary | ICD-10-CM | POA: Insufficient documentation

## 2011-08-06 DIAGNOSIS — R161 Splenomegaly, not elsewhere classified: Secondary | ICD-10-CM | POA: Insufficient documentation

## 2011-08-06 DIAGNOSIS — R197 Diarrhea, unspecified: Secondary | ICD-10-CM | POA: Insufficient documentation

## 2011-08-06 LAB — CBC
MCV: 81.3 fL (ref 78.0–100.0)
Platelets: 117 10*3/uL — ABNORMAL LOW (ref 150–400)
RBC: 5.52 MIL/uL (ref 4.22–5.81)
WBC: 7 10*3/uL (ref 4.0–10.5)

## 2011-08-06 LAB — DIFFERENTIAL
Lymphocytes Relative: 22 % (ref 12–46)
Lymphs Abs: 1.5 10*3/uL (ref 0.7–4.0)
Neutro Abs: 5 10*3/uL (ref 1.7–7.7)

## 2011-08-07 ENCOUNTER — Encounter (HOSPITAL_COMMUNITY): Payer: Self-pay

## 2011-08-07 ENCOUNTER — Emergency Department (HOSPITAL_COMMUNITY): Payer: BC Managed Care – PPO

## 2011-08-07 LAB — MONONUCLEOSIS SCREEN: Mono Screen: NEGATIVE

## 2011-08-07 LAB — COMPREHENSIVE METABOLIC PANEL
AST: 32 U/L (ref 0–37)
Alkaline Phosphatase: 78 U/L (ref 39–117)
CO2: 27 mEq/L (ref 19–32)
Calcium: 9.8 mg/dL (ref 8.4–10.5)
Creatinine, Ser: 0.78 mg/dL (ref 0.50–1.35)
Glucose, Bld: 91 mg/dL (ref 70–99)
Potassium: 3.5 mEq/L (ref 3.5–5.1)
Sodium: 136 mEq/L (ref 135–145)
Total Protein: 7.7 g/dL (ref 6.0–8.3)

## 2011-08-07 IMAGING — CT CT ABD-PELV W/ CM
1 of 3 series · 15 of 32 positions shown, 19 images · IV contrast (100 ML OMNI 300)
Comparison: CT of the abdomen and pelvis performed [DATE]

CLINICAL DATA: Left upper quadrant abdominal pain, nausea, vomiting
and diarrhea.  Known splenomegaly.

CT ABDOMEN AND PELVIS WITH CONTRAST
TECHNIQUE: Multidetector CT imaging of the abdomen and pelvis was
performed following the standard protocol during bolus
administration of intravenous contrast.
Contrast: 100mL OMNIPAQUE IOHEXOL 300 MG/ML IV SOLN

[Series 2: abd/pel with · axial · 0.75mm/px · z∈[+568,+998]mm · 15 of 96 slices shown, 19 images]
[im 5/96  soft-tissue]
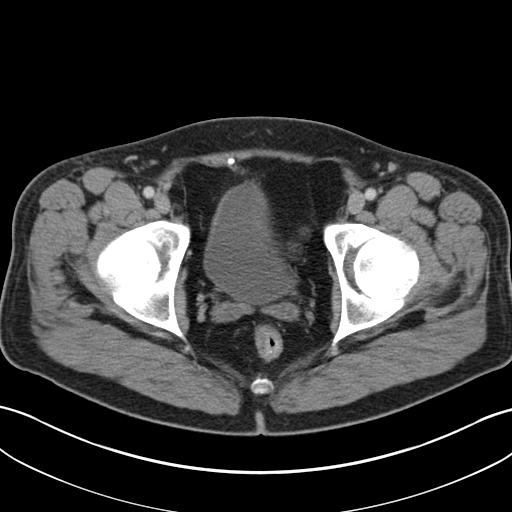
[im 5/96  bone]
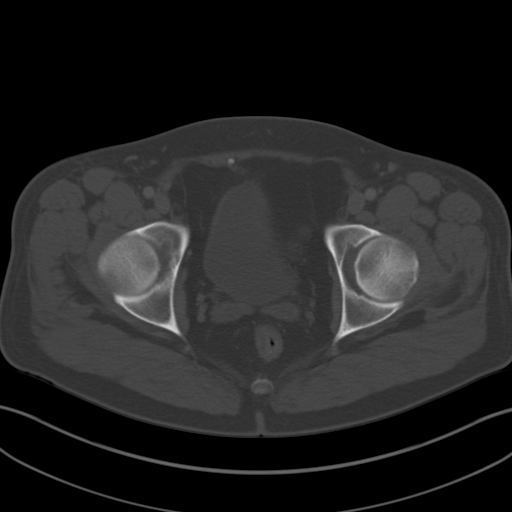
[im 13/96  soft-tissue]
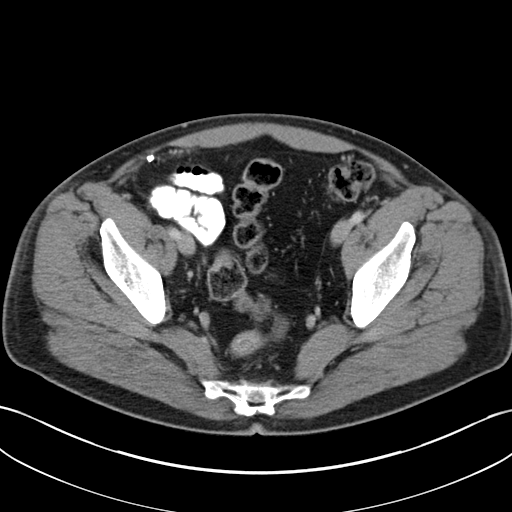
[im 21/96  soft-tissue]
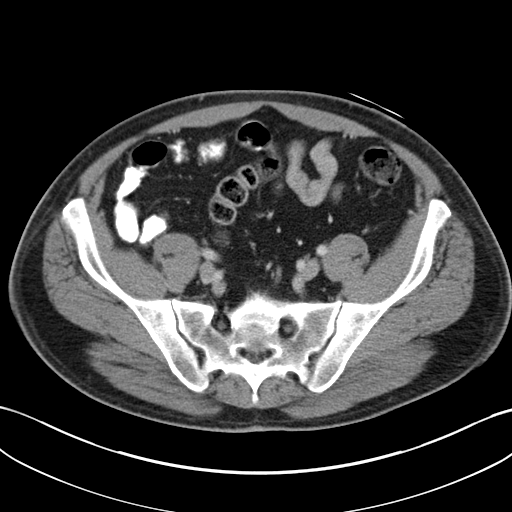
[im 25/96  soft-tissue]
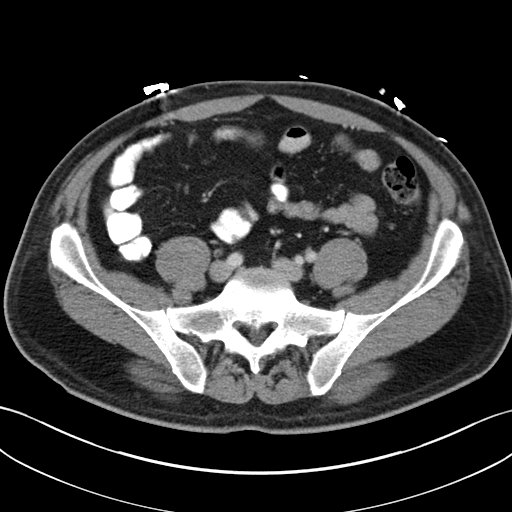
[im 34/96  soft-tissue]
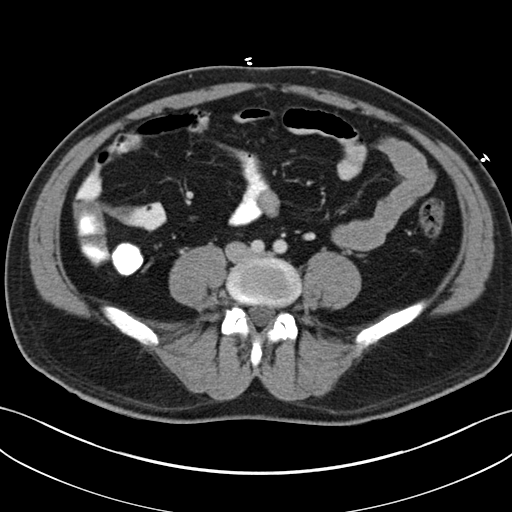
[im 42/96  soft-tissue]
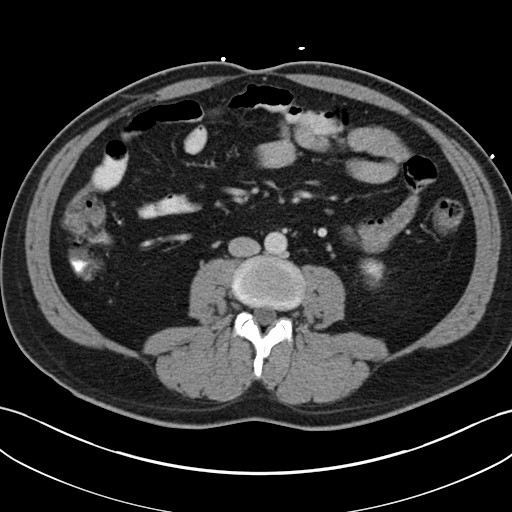
[im 50/96  soft-tissue]
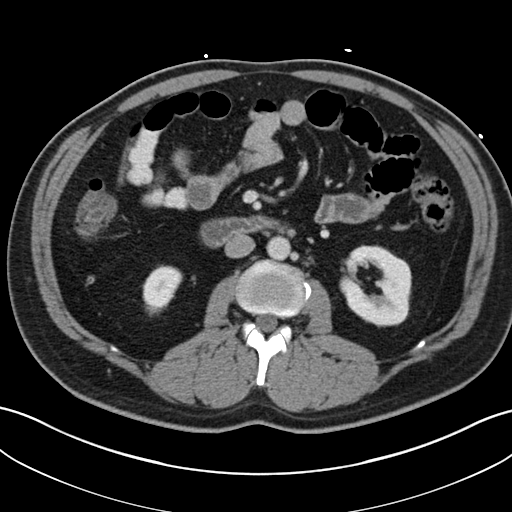
[im 54/96  soft-tissue]
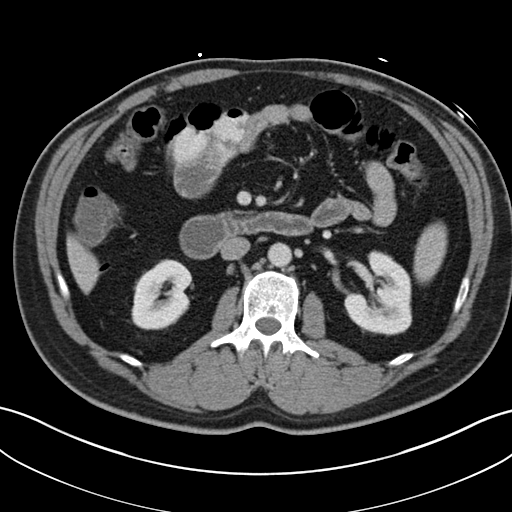
[im 62/96  soft-tissue]
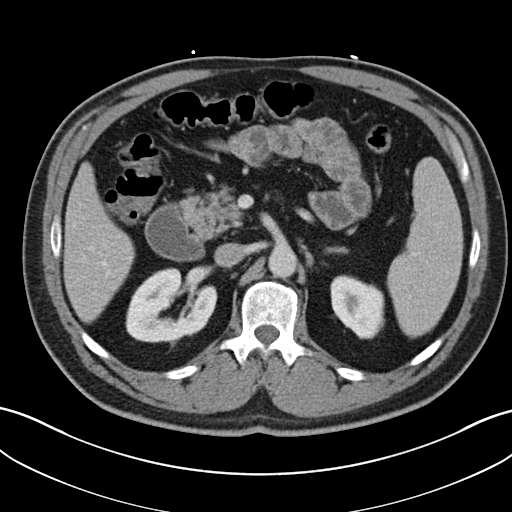
[im 62/96  bone]
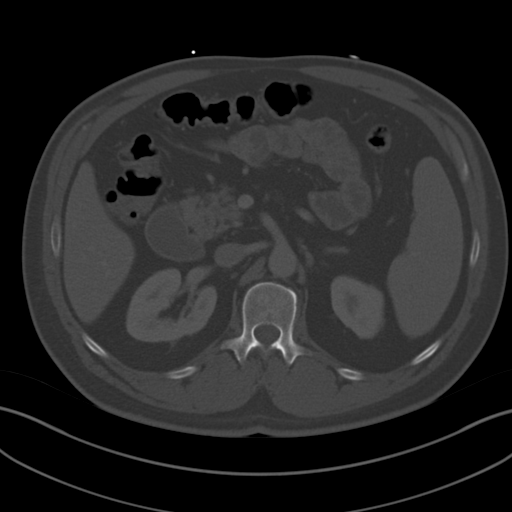
[im 71/96  soft-tissue]
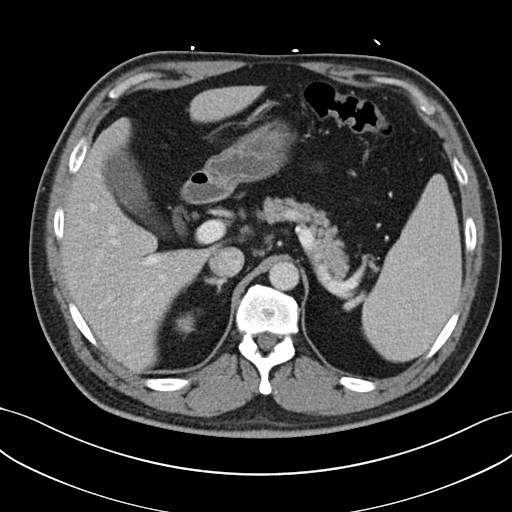
[im 75/96  soft-tissue]
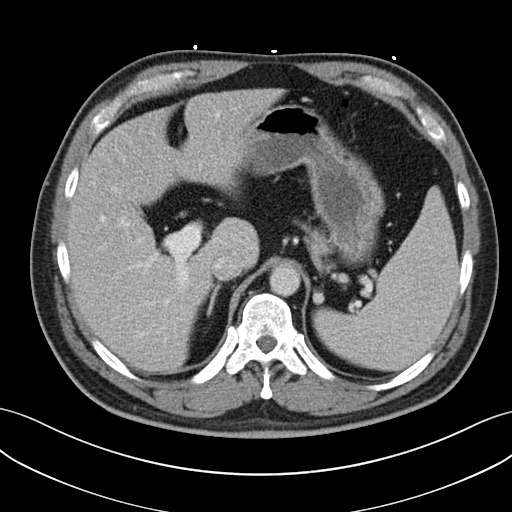
[im 79/96  lung]
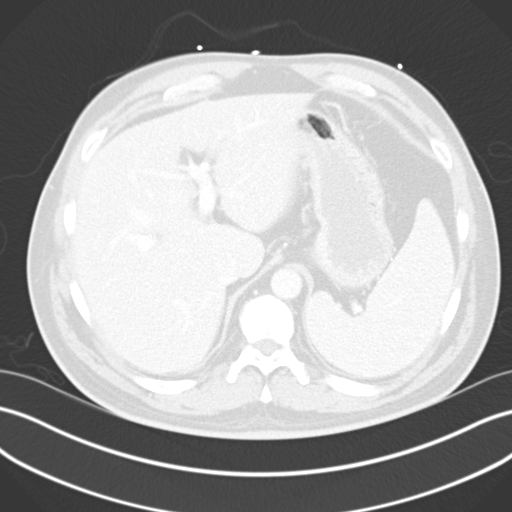
[im 83/96  soft-tissue]
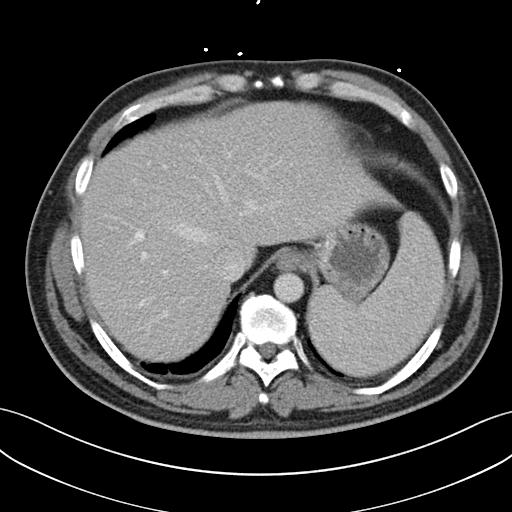
[im 83/96  lung]
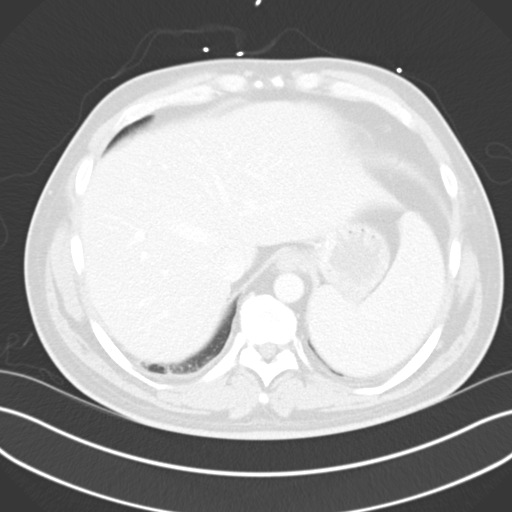
[im 87/96  lung]
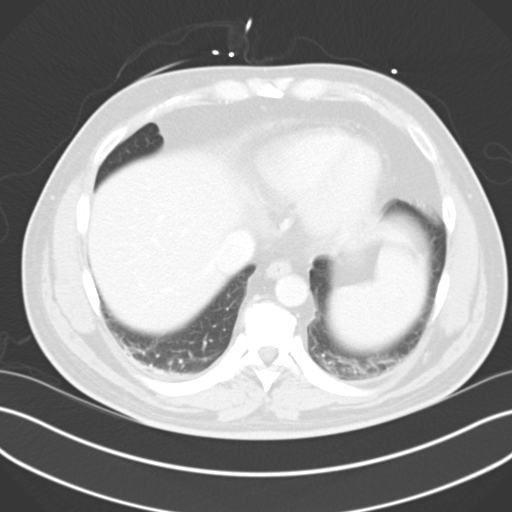
[im 91/96  soft-tissue]
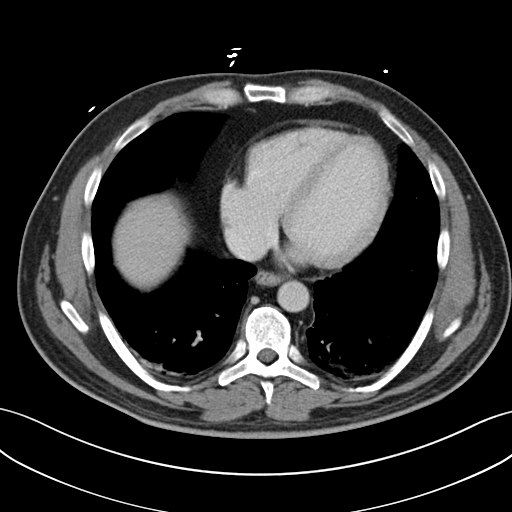
[im 91/96  lung]
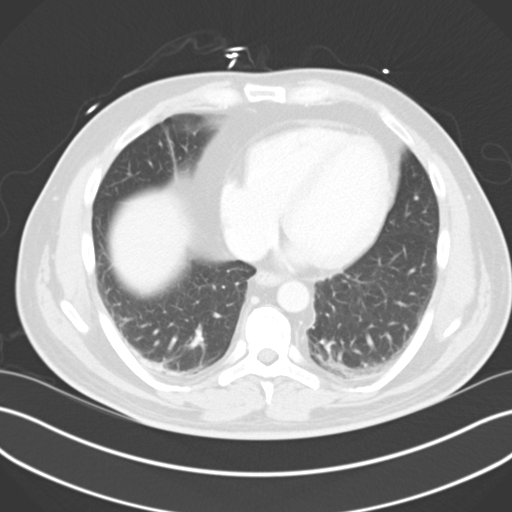

[15 of 32 positions shown; findings below may reference images not displayed]

FINDINGS: Mild bibasilar atelectasis is noted.

The liver is unremarkable in appearance.  The spleen remains
enlarged, measuring 17.5 cm in length.  The gallbladder is within
normal limits.  The pancreas and adrenal glands are unremarkable.

Mild nonspecific perinephric stranding is noted bilaterally.  The
kidneys are otherwise unremarkable in appearance.  There is no
evidence of hydronephrosis.  No renal or ureteral stones are
identified.

No free fluid is identified.  The small bowel is unremarkable in
appearance.  The stomach is within normal limits.  No acute
vascular abnormalities are seen.

The appendix is normal in caliber and contains air, without
evidence for appendicitis.  It extends to the inferior tip of the
liver.  Contrast progresses to the level of the cecum; the colon is
unremarkable in appearance.

The bladder is mildly distended and grossly unremarkable in
appearance.  An anterior abdominal wall mesh is noted at the right
inguinal region.  Minimal calcification is noted within the
prostate; the prostate remains normal in size.  No inguinal
lymphadenopathy is seen.

No acute osseous abnormalities are identified.
IMPRESSION: 1.  No acute abnormalities identified within the abdomen or pelvis.
2.  Significant splenomegaly again noted.
3.  Mild bibasilar atelectasis noted.

## 2011-08-07 MED ORDER — IOHEXOL 300 MG/ML  SOLN
100.0000 mL | Freq: Once | INTRAMUSCULAR | Status: AC | PRN
  Administered 2011-08-07: 100 mL via INTRAVENOUS

## 2011-08-14 ENCOUNTER — Encounter: Payer: BC Managed Care – PPO | Admitting: Oncology

## 2011-08-15 ENCOUNTER — Emergency Department (HOSPITAL_COMMUNITY)
Admission: EM | Admit: 2011-08-15 | Discharge: 2011-08-15 | Disposition: A | Discharge status: Home / Self Care | Payer: BC Managed Care – PPO | Attending: Emergency Medicine | Admitting: Emergency Medicine

## 2011-08-15 DIAGNOSIS — G8929 Other chronic pain: Secondary | ICD-10-CM | POA: Insufficient documentation

## 2011-08-15 DIAGNOSIS — R161 Splenomegaly, not elsewhere classified: Secondary | ICD-10-CM | POA: Insufficient documentation

## 2011-08-15 DIAGNOSIS — R1013 Epigastric pain: Secondary | ICD-10-CM | POA: Insufficient documentation

## 2011-08-15 DIAGNOSIS — D696 Thrombocytopenia, unspecified: Secondary | ICD-10-CM | POA: Insufficient documentation

## 2011-08-15 DIAGNOSIS — R1012 Left upper quadrant pain: Secondary | ICD-10-CM | POA: Insufficient documentation

## 2011-08-15 DIAGNOSIS — R112 Nausea with vomiting, unspecified: Secondary | ICD-10-CM | POA: Insufficient documentation

## 2011-08-15 DIAGNOSIS — M549 Dorsalgia, unspecified: Secondary | ICD-10-CM | POA: Insufficient documentation

## 2011-08-15 LAB — COMPREHENSIVE METABOLIC PANEL
ALT: 58 U/L — ABNORMAL HIGH (ref 0–53)
Albumin: 4.9 g/dL (ref 3.5–5.2)
Alkaline Phosphatase: 73 U/L (ref 39–117)
BUN: 13 mg/dL (ref 6–23)
CO2: 21 mEq/L (ref 19–32)
Calcium: 9.9 mg/dL (ref 8.4–10.5)
Chloride: 98 mEq/L (ref 96–112)
Creatinine, Ser: 0.7 mg/dL (ref 0.50–1.35)
Glucose, Bld: 93 mg/dL (ref 70–99)
Total Protein: 8.2 g/dL (ref 6.0–8.3)

## 2011-08-15 LAB — CBC
MCV: 82.2 fL (ref 78.0–100.0)
RDW: 13.6 % (ref 11.5–15.5)
WBC: 7.7 10*3/uL (ref 4.0–10.5)

## 2011-08-15 LAB — DIFFERENTIAL
Eosinophils Absolute: 0.1 10*3/uL (ref 0.0–0.7)
Eosinophils Relative: 2 % (ref 0–5)
Monocytes Absolute: 0.4 10*3/uL (ref 0.1–1.0)
Neutro Abs: 6 10*3/uL (ref 1.7–7.7)
Neutrophils Relative %: 78 % — ABNORMAL HIGH (ref 43–77)

## 2011-08-20 ENCOUNTER — Other Ambulatory Visit: Payer: Self-pay | Admitting: Oncology

## 2011-08-20 ENCOUNTER — Encounter (HOSPITAL_BASED_OUTPATIENT_CLINIC_OR_DEPARTMENT_OTHER): Payer: BC Managed Care – PPO | Admitting: Oncology

## 2011-08-20 DIAGNOSIS — D6959 Other secondary thrombocytopenia: Secondary | ICD-10-CM

## 2011-08-20 LAB — CBC WITH DIFFERENTIAL/PLATELET
Basophils Absolute: 0 10*3/uL (ref 0.0–0.1)
Eosinophils Absolute: 0.1 10*3/uL (ref 0.0–0.5)
LYMPH%: 14.7 % (ref 14.0–49.0)
MONO#: 0.3 10*3/uL (ref 0.1–0.9)
Platelets: 116 10*3/uL — ABNORMAL LOW (ref 140–400)
RBC: 5.7 10*6/uL (ref 4.20–5.82)

## 2011-08-20 LAB — COMPREHENSIVE METABOLIC PANEL
ALT: 100 U/L — ABNORMAL HIGH (ref 0–53)
CO2: 29 mEq/L (ref 19–32)
Calcium: 9.8 mg/dL (ref 8.4–10.5)
Chloride: 98 mEq/L (ref 96–112)
Creatinine, Ser: 0.89 mg/dL (ref 0.50–1.35)
Glucose, Bld: 108 mg/dL — ABNORMAL HIGH (ref 70–99)
Sodium: 136 mEq/L (ref 135–145)
Total Protein: 7.8 g/dL (ref 6.0–8.3)

## 2011-08-20 LAB — CHCC SMEAR

## 2011-09-10 ENCOUNTER — Other Ambulatory Visit: Payer: Self-pay | Admitting: Oncology

## 2011-09-10 ENCOUNTER — Encounter (HOSPITAL_BASED_OUTPATIENT_CLINIC_OR_DEPARTMENT_OTHER): Payer: BC Managed Care – PPO | Admitting: Oncology

## 2011-09-10 DIAGNOSIS — R110 Nausea: Secondary | ICD-10-CM

## 2011-09-10 DIAGNOSIS — R161 Splenomegaly, not elsewhere classified: Secondary | ICD-10-CM

## 2011-09-10 DIAGNOSIS — D6959 Other secondary thrombocytopenia: Secondary | ICD-10-CM

## 2011-09-10 DIAGNOSIS — R197 Diarrhea, unspecified: Secondary | ICD-10-CM

## 2011-09-10 LAB — CBC WITH DIFFERENTIAL/PLATELET
BASO%: 0.4 % (ref 0.0–2.0)
MCHC: 35.3 g/dL (ref 32.0–36.0)
MONO#: 0.3 10*3/uL (ref 0.1–0.9)
RBC: 5.59 10*6/uL (ref 4.20–5.82)
WBC: 5.9 10*3/uL (ref 4.0–10.3)
lymph#: 0.9 10*3/uL (ref 0.9–3.3)

## 2011-09-11 LAB — COMPREHENSIVE METABOLIC PANEL
ALT: 104 U/L — ABNORMAL HIGH (ref 0–53)
CO2: 25 mEq/L (ref 19–32)
Calcium: 9.8 mg/dL (ref 8.4–10.5)
Chloride: 101 mEq/L (ref 96–112)
Potassium: 3.6 mEq/L (ref 3.5–5.3)
Sodium: 139 mEq/L (ref 135–145)
Total Protein: 7.6 g/dL (ref 6.0–8.3)

## 2011-09-11 LAB — EPSTEIN-BARR VIRUS VCA, IGM: EBV VCA IgM: 0.45 {ISR}

## 2011-09-12 ENCOUNTER — Other Ambulatory Visit: Payer: Self-pay | Admitting: Oncology

## 2011-09-12 DIAGNOSIS — R161 Splenomegaly, not elsewhere classified: Secondary | ICD-10-CM

## 2011-09-12 DIAGNOSIS — D696 Thrombocytopenia, unspecified: Secondary | ICD-10-CM

## 2011-09-15 LAB — JAK-2 V617F

## 2011-09-17 ENCOUNTER — Other Ambulatory Visit (HOSPITAL_COMMUNITY): Payer: BC Managed Care – PPO

## 2011-09-17 ENCOUNTER — Ambulatory Visit (HOSPITAL_COMMUNITY): Payer: BC Managed Care – PPO

## 2011-09-17 ENCOUNTER — Other Ambulatory Visit: Payer: Self-pay | Admitting: Oncology

## 2011-09-17 ENCOUNTER — Ambulatory Visit (HOSPITAL_COMMUNITY)
Admission: RE | Admit: 2011-09-17 | Discharge: 2011-09-17 | Disposition: A | Discharge status: Home / Self Care | Payer: BC Managed Care – PPO | Source: Ambulatory Visit | Attending: Oncology | Admitting: Oncology

## 2011-09-17 ENCOUNTER — Other Ambulatory Visit: Payer: Self-pay | Admitting: Diagnostic Radiology

## 2011-09-17 ENCOUNTER — Emergency Department (HOSPITAL_COMMUNITY)
Admission: EM | Admit: 2011-09-17 | Discharge: 2011-09-18 | Disposition: A | Discharge status: Home / Self Care | Payer: BC Managed Care – PPO | Attending: Emergency Medicine | Admitting: Emergency Medicine

## 2011-09-17 DIAGNOSIS — R161 Splenomegaly, not elsewhere classified: Secondary | ICD-10-CM | POA: Insufficient documentation

## 2011-09-17 DIAGNOSIS — D696 Thrombocytopenia, unspecified: Secondary | ICD-10-CM | POA: Insufficient documentation

## 2011-09-17 DIAGNOSIS — G8918 Other acute postprocedural pain: Secondary | ICD-10-CM | POA: Insufficient documentation

## 2011-09-17 LAB — CBC
HCT: 45.4 % (ref 39.0–52.0)
Hemoglobin: 16.6 g/dL (ref 13.0–17.0)
MCHC: 36.6 g/dL — ABNORMAL HIGH (ref 30.0–36.0)
RDW: 13.6 % (ref 11.5–15.5)
WBC: 5.7 10*3/uL (ref 4.0–10.5)

## 2011-09-17 LAB — PROTIME-INR
INR: 0.94 (ref 0.00–1.49)
Prothrombin Time: 12.8 seconds (ref 11.6–15.2)

## 2011-09-17 LAB — APTT: aPTT: 37 seconds (ref 24–37)

## 2011-09-17 IMAGING — CT CT BIOPSY
1 series · 8 of 10 positions shown, 13 images · non-contrast
Comparison: none

CLINICAL HISTORY: 40-year-old with thrombocytopenia and
splenomegaly.

[Series 4: biopsysingle 5.0 b60f · axial · 0.74mm/px · z∈[-24,-16]mm · 8 of 10 slices shown, 13 images]
[im 2/10  soft-tissue]
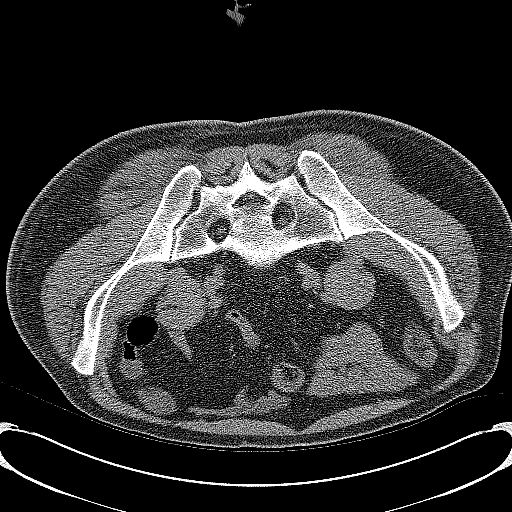
[im 2/10  bone]
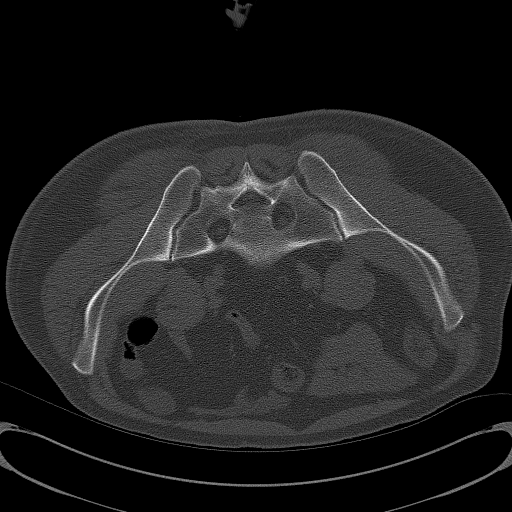
[im 3/10  soft-tissue]
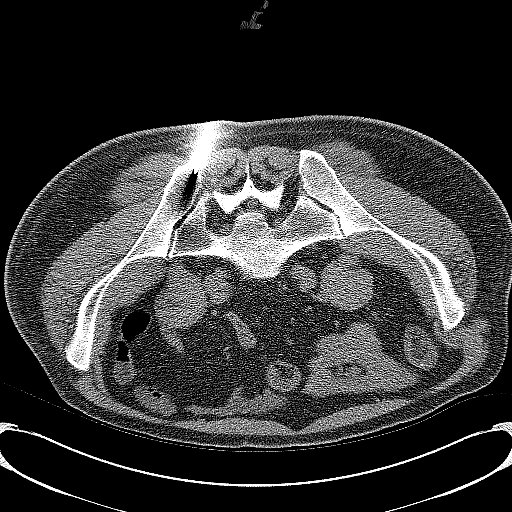
[im 4/10  soft-tissue]
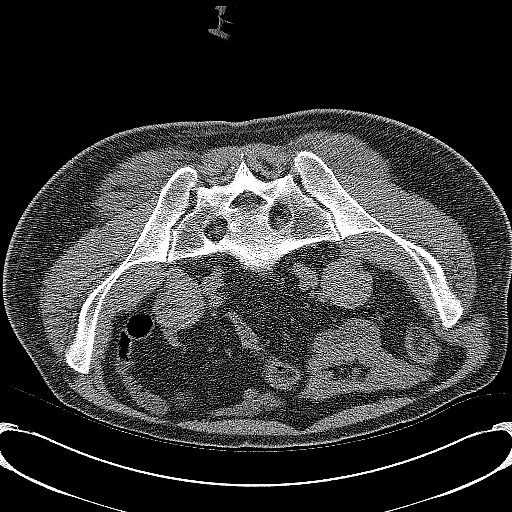
[im 5/10  soft-tissue]
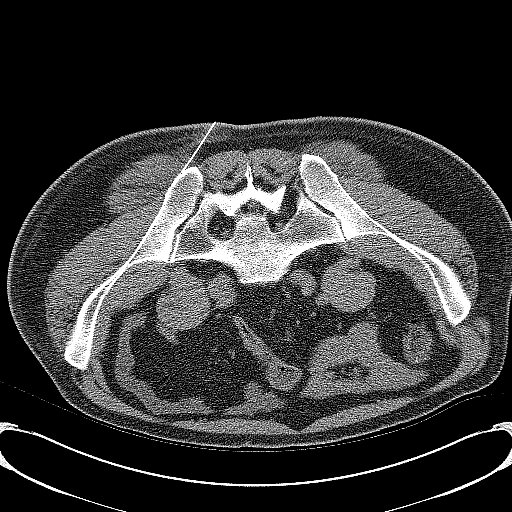
[im 5/10  lung]
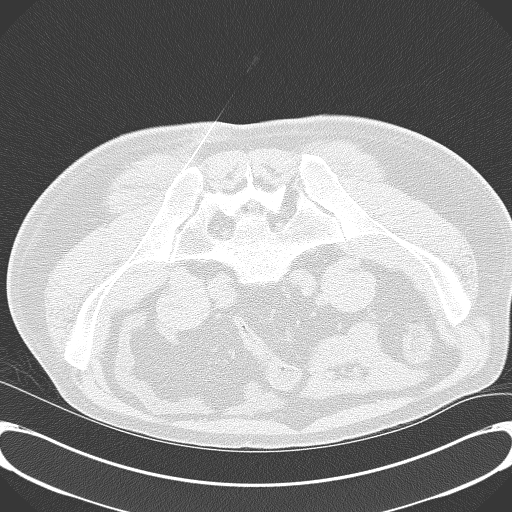
[im 6/10  soft-tissue]
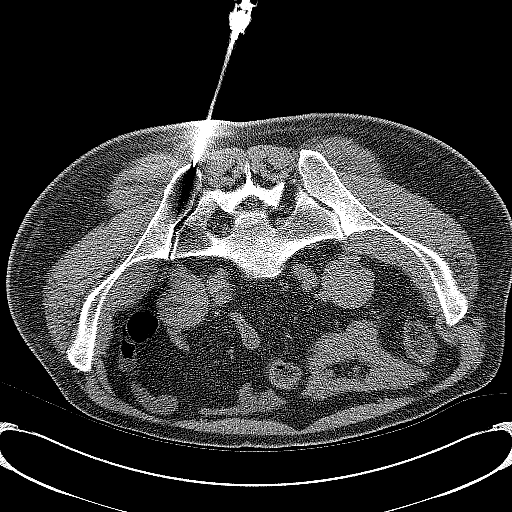
[im 6/10  lung]
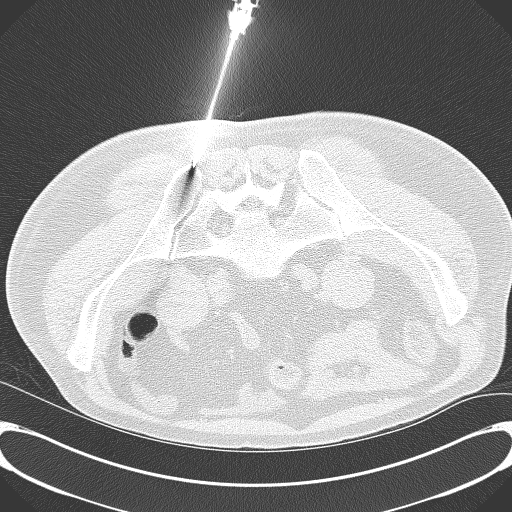
[im 7/10  soft-tissue]
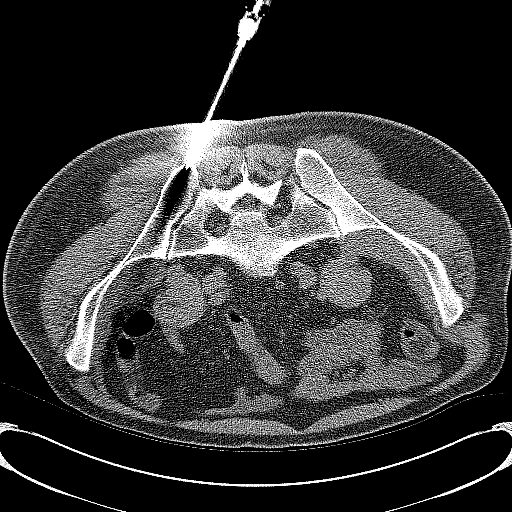
[im 8/10  soft-tissue]
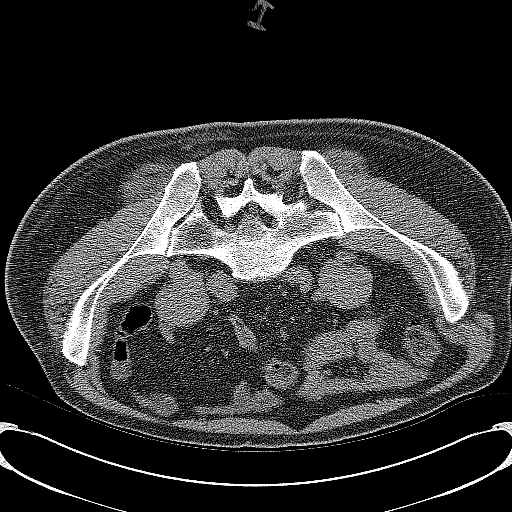
[im 8/10  lung]
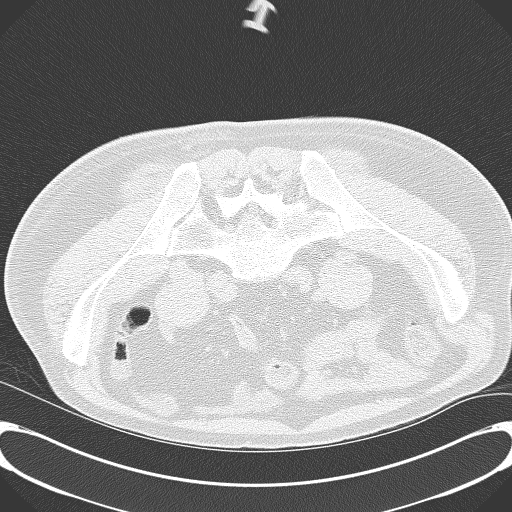
[im 9/10  soft-tissue]
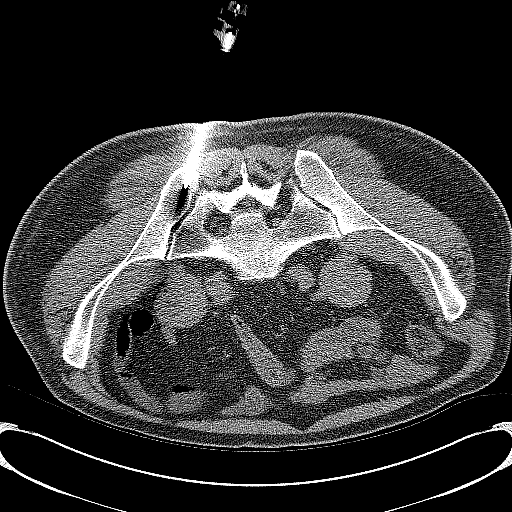
[im 9/10  lung]
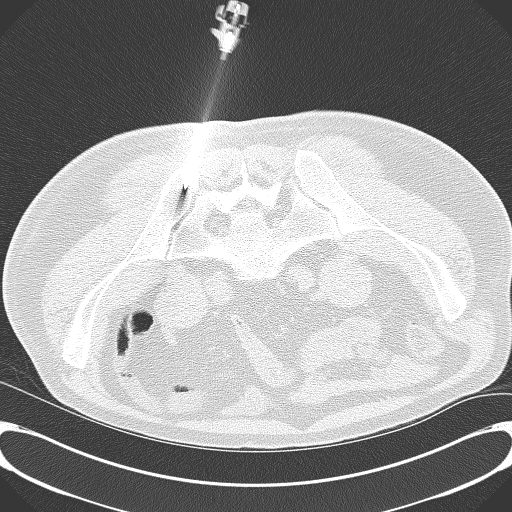

[8 of 10 positions shown; findings below may reference images not displayed]

PROCEDURE(S): CT GUIDED BONE MARROW ASPIRATE AND BIOPSY

Medications:Versed 6 mg, Fentanyl 100 mcg. A radiology nurse
monitored the patient for moderate sedation.

Moderate sedation time:35 minutes

Procedure:The procedure was explained to the patient.  The risks
and benefits of the procedure were discussed and the patient's
questions were addressed.  Informed consent was obtained from the
patient.  The patient was placed prone on the CT scanner.  Images
of the pelvis were obtained.  The back was prepped and draped in a
sterile fashion.  The skin and right posterior iliac bone were
anesthetized with lidocaine.  11 gauge bone needle was directed in
the posterior iliac bone with CT guidance.  Two aspirates were
obtained.  The needle was removed.  The 11 gauge bone needle was
directed back into the right iliac bone with CT guidance and three
core biopsies were attempted.  Adequate core samples were obtained
on two of the core biopsies.
FINDINGS: Needle placement confirmed within the posterior right
iliac bone.

Complications: None
IMPRESSION: CT guided bone marrow aspirates and biopsies.

## 2011-09-18 ENCOUNTER — Other Ambulatory Visit (HOSPITAL_COMMUNITY): Payer: BC Managed Care – PPO

## 2011-10-06 ENCOUNTER — Encounter: Payer: Self-pay | Admitting: Oncology

## 2011-10-06 ENCOUNTER — Telehealth: Payer: Self-pay | Admitting: *Deleted

## 2011-10-06 NOTE — Telephone Encounter (Signed)
Spoke with pt today.   Pt would like to have a release note from Dr. 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    Informed pt that md was not in office today.   Message will be relayed to md for 10/07/11. Pt's   Phone     [phone].

## 2011-10-08 ENCOUNTER — Encounter: Payer: Self-pay | Admitting: *Deleted

## 2011-11-12 ENCOUNTER — Telehealth: Payer: Self-pay | Admitting: *Deleted

## 2011-11-12 NOTE — Telephone Encounter (Signed)
Message on voicemail from pt stating that he needs to bring in [facility] paperwork for completion. Returned call to pts cell for more information. Per the pt he would like to bring the [facility] paperwork on Wednesday 11/19/11. Pt requested if completed paperwork could be mailed to his home. Will notify Ebony in managed care.

## 2011-11-14 ENCOUNTER — Telehealth: Payer: Self-pay | Admitting: Oncology

## 2011-11-14 NOTE — Telephone Encounter (Signed)
Put patient's disability paper on nurse's desk.

## 2011-11-14 NOTE — Telephone Encounter (Signed)
Put fmla papers in registration's desk.

## 2011-11-14 NOTE — Telephone Encounter (Signed)
Mailed fmla papers to patient's home per nurse's note.

## 2011-12-09 ENCOUNTER — Other Ambulatory Visit: Payer: BC Managed Care – PPO

## 2011-12-09 ENCOUNTER — Ambulatory Visit: Payer: BC Managed Care – PPO | Admitting: Oncology

## 2012-02-24 ENCOUNTER — Encounter (HOSPITAL_BASED_OUTPATIENT_CLINIC_OR_DEPARTMENT_OTHER): Payer: Self-pay

## 2012-02-24 ENCOUNTER — Emergency Department (HOSPITAL_BASED_OUTPATIENT_CLINIC_OR_DEPARTMENT_OTHER)
Admission: EM | Admit: 2012-02-24 | Discharge: 2012-02-24 | Disposition: A | Discharge status: Home / Self Care | Payer: BC Managed Care – PPO | Attending: Emergency Medicine | Admitting: Emergency Medicine

## 2012-02-24 DIAGNOSIS — M549 Dorsalgia, unspecified: Secondary | ICD-10-CM | POA: Insufficient documentation

## 2012-02-24 DIAGNOSIS — F17200 Nicotine dependence, unspecified, uncomplicated: Secondary | ICD-10-CM | POA: Insufficient documentation

## 2012-02-24 MED ORDER — KETOROLAC TROMETHAMINE 60 MG/2ML IM SOLN
60.0000 mg | Freq: Once | INTRAMUSCULAR | Status: AC
  Administered 2012-02-24: 60 mg via INTRAMUSCULAR
  Filled 2012-02-24: qty 2

## 2012-02-24 MED ORDER — CYCLOBENZAPRINE HCL 10 MG PO TABS: 10.0000 mg | ORAL_TABLET | Freq: Three times a day (TID) | ORAL | Status: AC | PRN

## 2012-02-24 MED ORDER — CYCLOBENZAPRINE HCL 10 MG PO TABS
10.0000 mg | ORAL_TABLET | Freq: Once | ORAL | Status: DC
  Filled 2012-02-24: qty 1

## 2012-02-24 MED ORDER — ONDANSETRON HCL 4 MG/2ML IJ SOLN
4.0000 mg | Freq: Once | INTRAMUSCULAR | Status: DC
  Filled 2012-02-24: qty 2

## 2012-02-24 MED ORDER — TRAMADOL-ACETAMINOPHEN 37.5-325 MG PO TABS: ORAL_TABLET | ORAL | Status: AC

## 2012-02-24 MED ORDER — MORPHINE SULFATE 4 MG/ML IJ SOLN
4.0000 mg | Freq: Once | INTRAMUSCULAR | Status: DC
  Filled 2012-02-24: qty 1

## 2012-02-24 MED ORDER — HYDROCODONE-ACETAMINOPHEN 5-325 MG PO TABS: 2.0000 | ORAL_TABLET | Freq: Four times a day (QID) | ORAL | Status: AC | PRN

## 2012-02-24 NOTE — ED Notes (Signed)
Pt sts Hx back pain,worst w/in the last 1hr

## 2012-02-24 NOTE — ED Provider Notes (Signed)
History     CSN: [id number]  Arrival date & time 02/24/12  1308   First MD Initiated Contact with Patient 02/24/12 [phone]      Chief Complaint  Patient presents with  . Back Pain    (Consider location/radiation/quality/duration/timing/severity/associated sxs/prior treatment) HPI Patient relates he has chronic back pain. He relates over the weekend he mowed the yard for the first time this spring using a riding lawnmower. He relates about an hour and a half ago while at work he started getting worsening lower back pain. The pain is in his whole lumbar spine. He denies any radiation into his legs or numbness in his legs. He states this is like pain his had before. He relates he's been recently to the [facility] walk-in clinic for similar symptoms. He states nothing makes it feel worse and nothing makes it feel better however during his exam when he moves or changes positions the pain seems to get worse. He denies any other known injury.  PCP [facility] Dr Clelia Croft  History reviewed. No pertinent past medical history. Chronic back pain Splenomegaly and thrombocytopenia  Past Surgical History  Procedure Date  . Hernia repair     History reviewed. No pertinent family history.  History  Substance Use Topics  . Smoking status: occassional  . Smokeless tobacco: Not on file  . Alcohol Use: no  employed Lives with spouse   Review of Systems  All other systems reviewed and are negative.    Allergies  Review of patient's allergies indicates no known allergies.  Home Medications   Current Outpatient Rx  none  BP 126/78  Pulse 88  Temp(Src) 98.7 F (37.1 C) (Oral)  Resp 20  SpO2 95%  Vital signs normal    Physical Exam  Constitutional: He is oriented to person, place, and time. He appears well-developed and well-nourished.  Non-toxic appearance. He does not appear ill. No distress.  HENT:  Head: Normocephalic and atraumatic.  Right Ear: External  ear normal.  Left Ear: External ear normal.  Nose: Nose normal. No mucosal edema or rhinorrhea.  Mouth/Throat: Oropharynx is clear and moist and mucous membranes are normal. No dental abscesses or uvula swelling.  Eyes: Conjunctivae and EOM are normal. Pupils are equal, round, and reactive to light.  Neck: Normal range of motion and full passive range of motion without pain. Neck supple.  Cardiovascular: Normal rate, regular rhythm and normal heart sounds.  Exam reveals no gallop and no friction rub.   No murmur heard. Pulmonary/Chest: Effort normal and breath sounds normal. No respiratory distress. He has no wheezes. He has no rhonchi. He has no rales. He exhibits no tenderness and no crepitus.  Abdominal: Soft. Normal appearance and bowel sounds are normal. He exhibits no distension. There is no tenderness. There is no rebound and no guarding.  Musculoskeletal: Normal range of motion. He exhibits no edema and no tenderness.       Lumbar back: He exhibits tenderness, bony tenderness, pain and spasm. He exhibits no swelling, no edema and no deformity.       Moves all extremities well. Reflexes are depressed bilaterally in the patellar reflexes. He has no pain on straight leg raising. He has diffuse pain along his lumbar spine and has very tender paraspinous muscles along the lumbar spine. He has pain on lateral flexion and forward flexion at the waist.  Neurological: He is alert and oriented to person, place, and time. He has normal strength. No cranial nerve deficit.  Skin: Skin is warm, dry and intact. No rash noted. He is not diaphoretic. No erythema. No pallor.  Psychiatric: He has a normal mood and affect. His speech is normal and behavior is normal. His mood appears not anxious.    ED Course  Procedures (including critical care time)   Medications  morphine 4 MG/ML injection 4 mg (not administered)  cyclobenzaprine (FLEXERIL) tablet 10 mg (not administered)  ondansetron (ZOFRAN)  injection 4 mg (not administered)  ketorolac (TORADOL) injection 60 mg (not administered)   PT has to drive himself home, unable to wake up his wife, no one at work can take him home so he was given toradol IM for pain. Morphine/zofran cancelled.   [facility] site reviewed, he has two scripts for hydrocodone one in Sept and one in November.   1. Back pain    New Prescriptions   CYCLOBENZAPRINE (FLEXERIL) 10 MG TABLET    Take 1 tablet (10 mg total) by mouth 3 (three) times daily as needed for muscle spasms.   HYDROCODONE-ACETAMINOPHEN (NORCO) 5-325 MG PER TABLET    Take 2 tablets by mouth every 6 (six) hours as needed for pain.   TRAMADOL-ACETAMINOPHEN (ULTRACET) 37.5-325 MG PER TABLET    2 tabs po QID prn pain   Plan discharge Devoria Albe, MD, Armando Gang    MDM          Ward Givens, MD 02/24/12 [phone]

## 2012-02-24 NOTE — Discharge Instructions (Signed)
Use heat to relax your back muscles. Take the norco with the flexeril initially for your pain, when the norco is gone you can take the ultracet for pain. Recheck with your doctor if you aren't improving over the next 3-5 days.

## 2012-07-22 ENCOUNTER — Emergency Department (HOSPITAL_COMMUNITY)
Admission: EM | Admit: 2012-07-22 | Discharge: 2012-07-22 | Disposition: A | Discharge status: Home / Self Care | Payer: BC Managed Care – PPO | Attending: Emergency Medicine | Admitting: Emergency Medicine

## 2012-07-22 ENCOUNTER — Emergency Department (HOSPITAL_COMMUNITY): Payer: BC Managed Care – PPO

## 2012-07-22 ENCOUNTER — Encounter (HOSPITAL_COMMUNITY): Payer: Self-pay | Admitting: Family Medicine

## 2012-07-22 DIAGNOSIS — F17200 Nicotine dependence, unspecified, uncomplicated: Secondary | ICD-10-CM | POA: Insufficient documentation

## 2012-07-22 DIAGNOSIS — R161 Splenomegaly, not elsewhere classified: Secondary | ICD-10-CM | POA: Insufficient documentation

## 2012-07-22 DIAGNOSIS — R112 Nausea with vomiting, unspecified: Secondary | ICD-10-CM | POA: Insufficient documentation

## 2012-07-22 DIAGNOSIS — Z79899 Other long term (current) drug therapy: Secondary | ICD-10-CM | POA: Insufficient documentation

## 2012-07-22 DIAGNOSIS — M549 Dorsalgia, unspecified: Secondary | ICD-10-CM | POA: Insufficient documentation

## 2012-07-22 DIAGNOSIS — R109 Unspecified abdominal pain: Secondary | ICD-10-CM | POA: Insufficient documentation

## 2012-07-22 HISTORY — DX: Splenomegaly, not elsewhere classified: R16.1

## 2012-07-22 LAB — CBC WITH DIFFERENTIAL/PLATELET
Basophils Absolute: 0 10*3/uL (ref 0.0–0.1)
Basophils Relative: 0 % (ref 0–1)
Eosinophils Absolute: 0.1 10*3/uL (ref 0.0–0.7)
Eosinophils Relative: 2 % (ref 0–5)
HCT: 43.1 % (ref 39.0–52.0)
MCHC: 36.2 g/dL — ABNORMAL HIGH (ref 30.0–36.0)
Monocytes Absolute: 0.5 10*3/uL (ref 0.1–1.0)
Neutro Abs: 4.7 10*3/uL (ref 1.7–7.7)
RDW: 13.9 % (ref 11.5–15.5)

## 2012-07-22 LAB — POCT I-STAT, CHEM 8
Hemoglobin: 15 g/dL (ref 13.0–17.0)
Sodium: 140 mEq/L (ref 135–145)
TCO2: 23 mmol/L (ref 0–100)

## 2012-07-22 LAB — HEPATIC FUNCTION PANEL
Bilirubin, Direct: <0.1 mg/dL (ref 0.0–0.3)
Total Bilirubin: 0.5 mg/dL (ref 0.3–1.2)

## 2012-07-22 LAB — URINALYSIS, ROUTINE W REFLEX MICROSCOPIC
Bilirubin Urine: NEGATIVE
Ketones, ur: NEGATIVE mg/dL
Nitrite: NEGATIVE
Protein, ur: NEGATIVE mg/dL
Urobilinogen, UA: 1 mg/dL (ref 0.0–1.0)

## 2012-07-22 LAB — LIPASE, BLOOD: Lipase: 34 U/L (ref 11–59)

## 2012-07-22 IMAGING — CT CT ABD-PELV W/ CM
1 of 2 series · 15 of 32 positions shown, 19 images · IV contrast (OMNIPAQUE 300)
Comparison: CT [DATE]

CLINICAL DATA: Back pain, low abdominal pain

CT ABDOMEN AND PELVIS WITH CONTRAST
TECHNIQUE: Multidetector CT imaging of the abdomen and pelvis was
performed following the standard protocol during bolus
administration of intravenous contrast.
Contrast: 100mL OMNIPAQUE IOHEXOL 300 MG/ML  SOLN CT [DATE]

[Series 2: abd/pel with · axial · 0.79mm/px · z∈[+1046,+1531]mm · 15 of 107 slices shown, 19 images]
[im 5/107  soft-tissue]
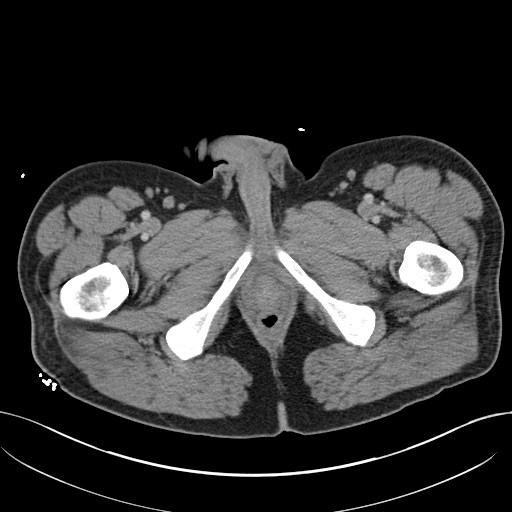
[im 5/107  bone]
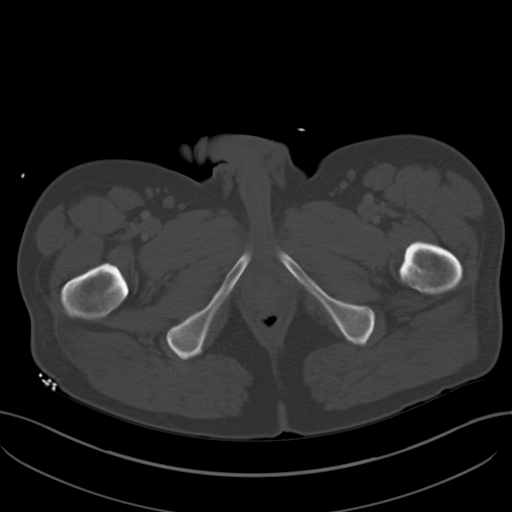
[im 14/107  soft-tissue]
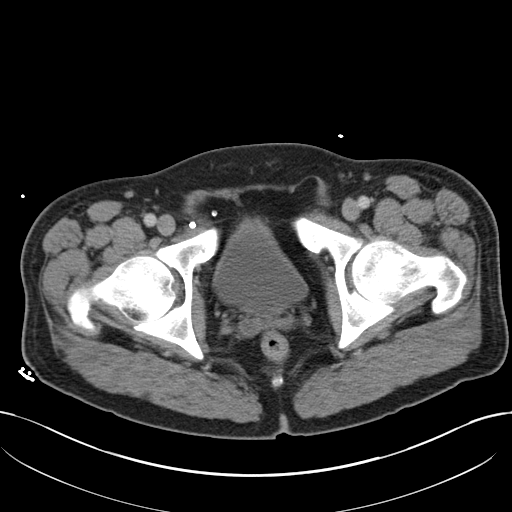
[im 24/107  soft-tissue]
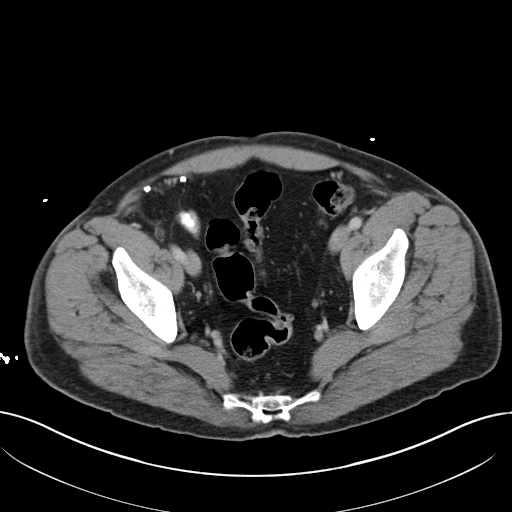
[im 28/107  soft-tissue]
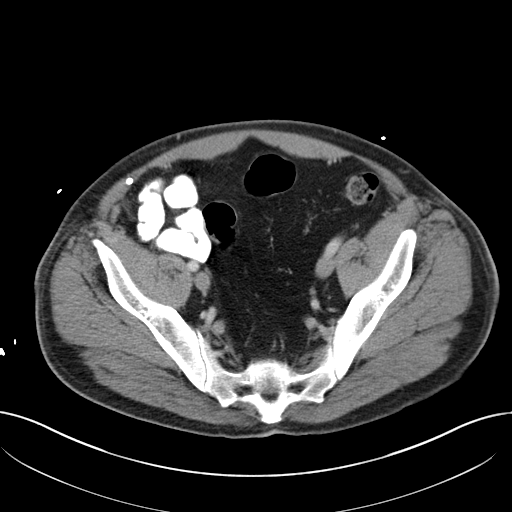
[im 37/107  soft-tissue]
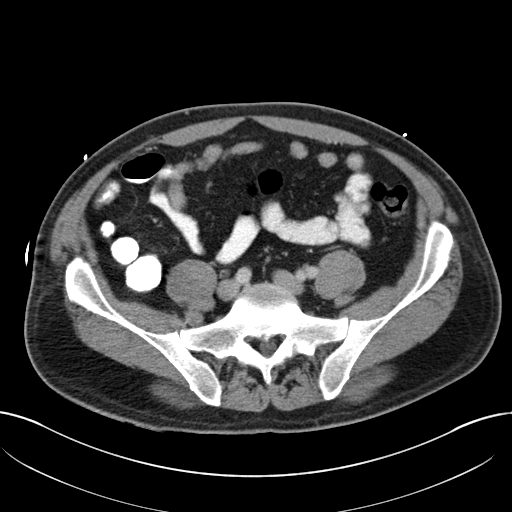
[im 47/107  soft-tissue]
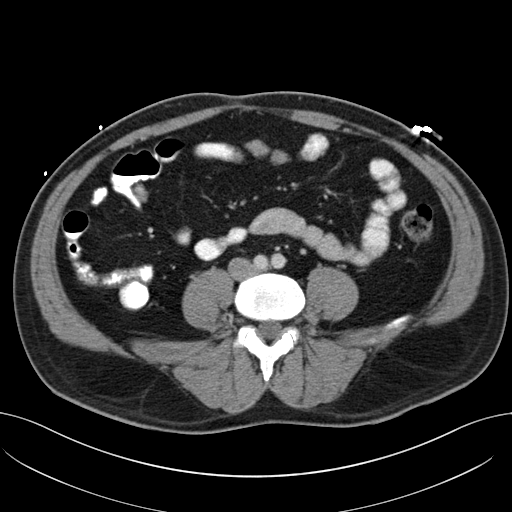
[im 56/107  soft-tissue]
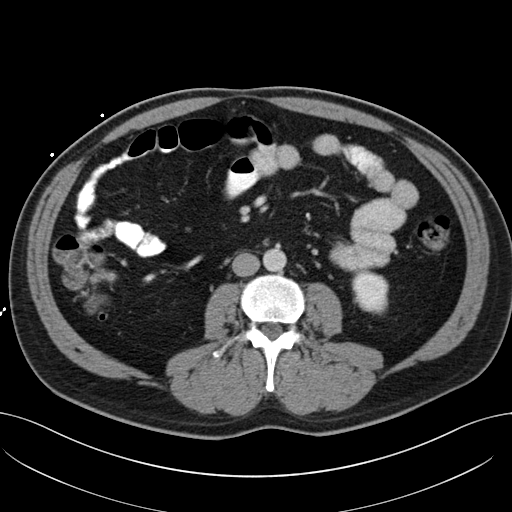
[im 60/107  soft-tissue]
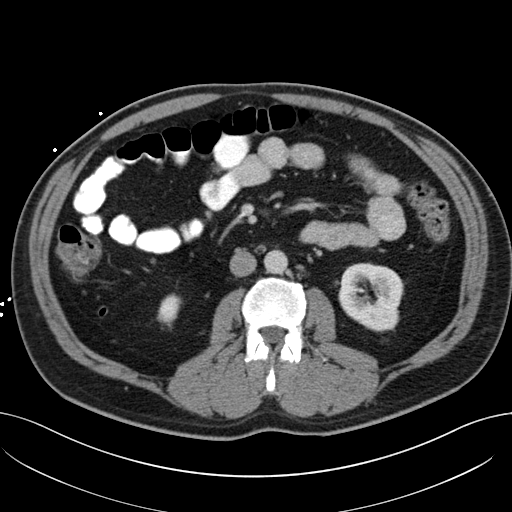
[im 70/107  soft-tissue]
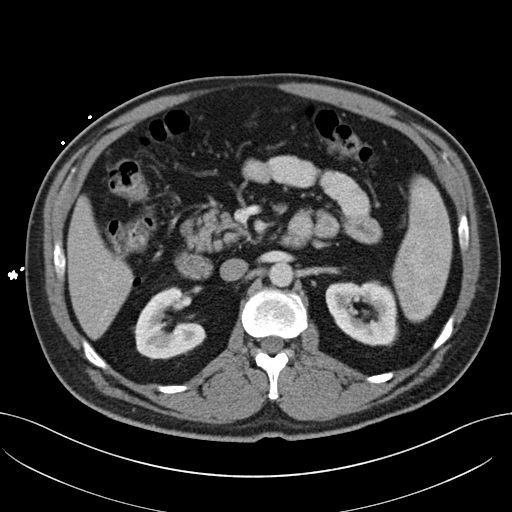
[im 70/107  bone]
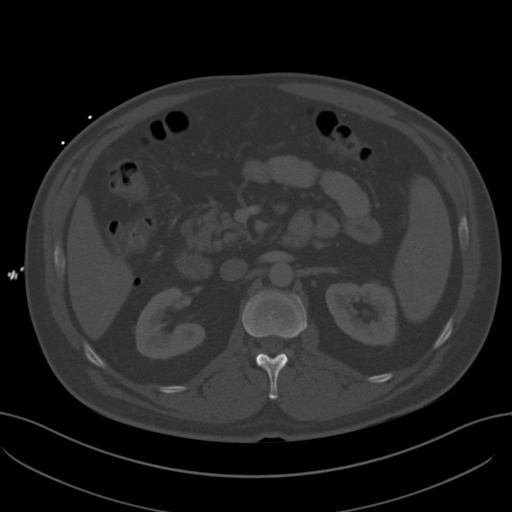
[im 79/107  soft-tissue]
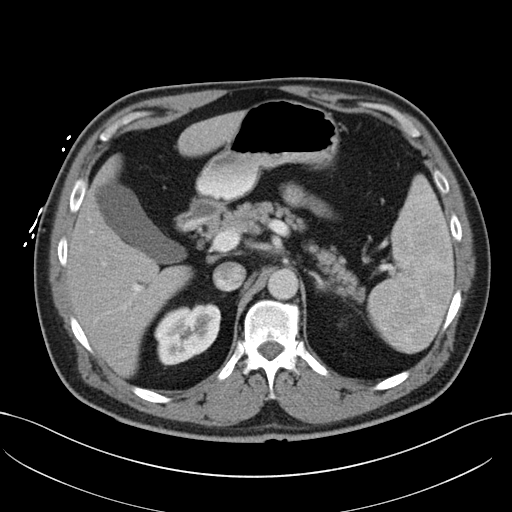
[im 83/107  soft-tissue]
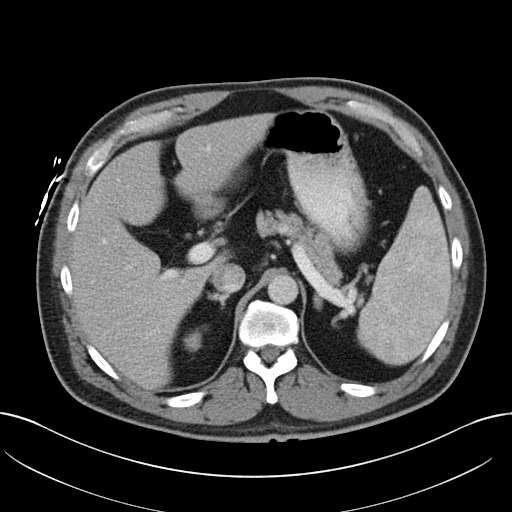
[im 88/107  lung]
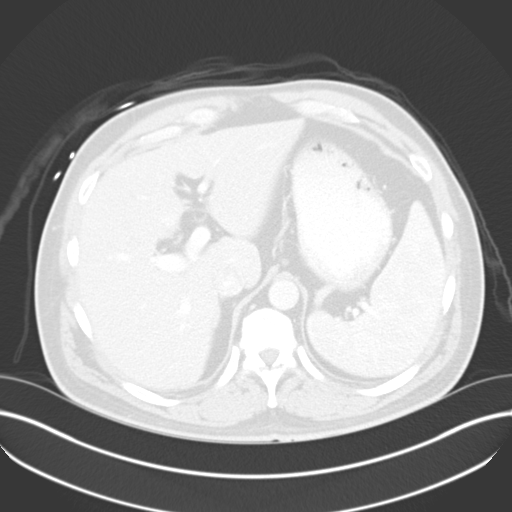
[im 93/107  soft-tissue]
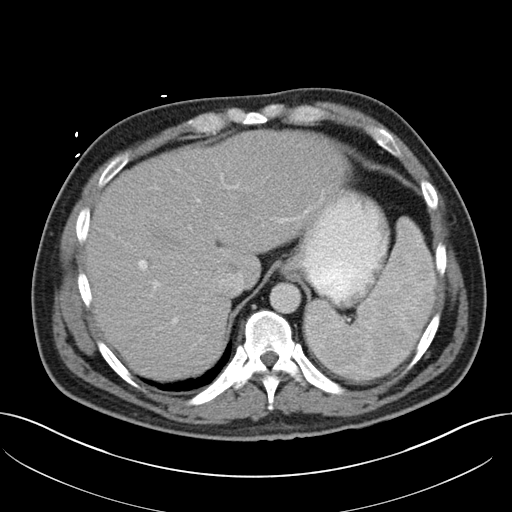
[im 93/107  lung]
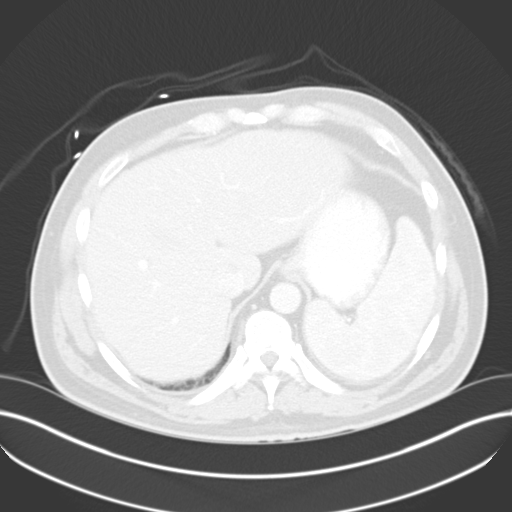
[im 97/107  lung]
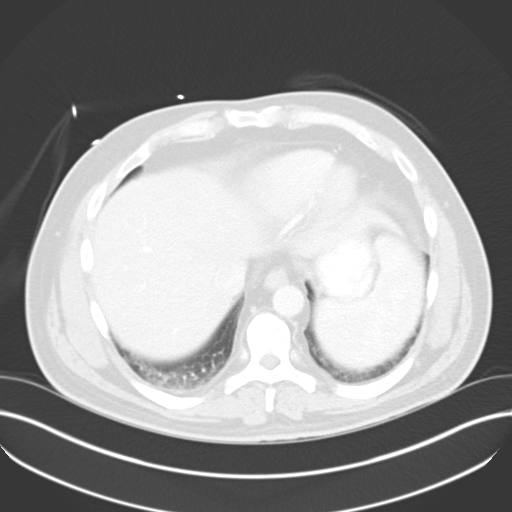
[im 102/107  soft-tissue]
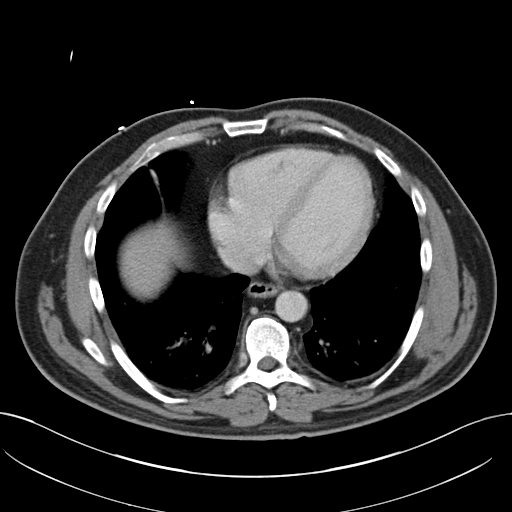
[im 102/107  lung]
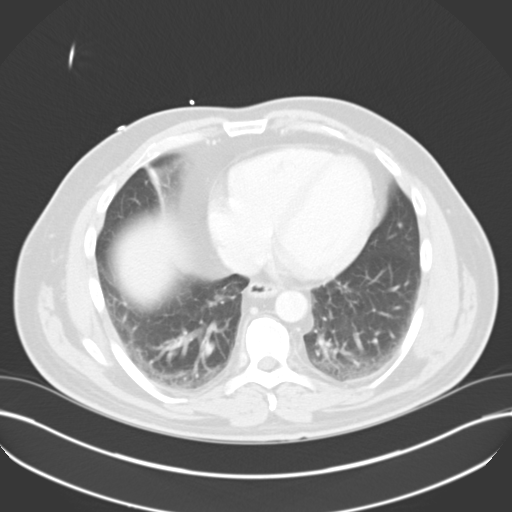

[15 of 32 positions shown; findings below may reference images not displayed]

FINDINGS: Lung bases are clear.  No pericardial fluid.  No focal
hepatic lesion.  The gallbladder, pancreas, spleen, adrenal glands,
and kidneys are normal.

The stomach, small bowel, appendix, and cecum are normal.  Colon
rectosigmoid colon are normal.

Abdominal aorta normal caliber.  No retroperitoneal or periportal
lymphadenopathy. There is a broad-based disc bulge at L5-S1 and
facet disease on the right.

No free fluid the pelvis.  Prostate gland and bladder normal.  No
distal ureteral stones or bladder stones.  No pelvic
lymphadenopathy. Review of  bone windows demonstrates no aggressive
osseous lesions..
IMPRESSION: 1.  No explanation for abdominal pain.
2.  No nephrolithiasis  or ureterolithiasis.
3.  Normal appendix
4.  Broad-based disc bulge at L5-S1.

## 2012-07-22 MED ORDER — OXYCODONE-ACETAMINOPHEN 5-325 MG PO TABS: 1.0000 | ORAL_TABLET | ORAL | Status: AC | PRN

## 2012-07-22 MED ORDER — ONDANSETRON HCL 4 MG/2ML IJ SOLN
4.0000 mg | Freq: Once | INTRAMUSCULAR | Status: AC
  Administered 2012-07-22: 4 mg via INTRAVENOUS
  Filled 2012-07-22: qty 2

## 2012-07-22 MED ORDER — IOHEXOL 300 MG/ML  SOLN
100.0000 mL | Freq: Once | INTRAMUSCULAR | Status: AC | PRN
  Administered 2012-07-22: 100 mL via INTRAVENOUS

## 2012-07-22 MED ORDER — ONDANSETRON HCL 8 MG PO TABS: 8.0000 mg | ORAL_TABLET | Freq: Three times a day (TID) | ORAL | Status: AC | PRN

## 2012-07-22 MED ORDER — HYDROMORPHONE HCL PF 1 MG/ML IJ SOLN: 1.0000 mg | Freq: Once | INTRAMUSCULAR | Status: DC

## 2012-07-22 MED ORDER — SODIUM CHLORIDE 0.9 % IV BOLUS (SEPSIS)
1000.0000 mL | Freq: Once | INTRAVENOUS | Status: AC
  Administered 2012-07-22: 1000 mL via INTRAVENOUS

## 2012-07-22 MED ORDER — ONDANSETRON HCL 4 MG/2ML IJ SOLN: 4.0000 mg | Freq: Once | INTRAMUSCULAR | Status: DC

## 2012-07-22 MED ORDER — HYDROMORPHONE HCL PF 1 MG/ML IJ SOLN
1.0000 mg | Freq: Once | INTRAMUSCULAR | Status: AC
  Administered 2012-07-22: 1 mg via INTRAVENOUS
  Filled 2012-07-22: qty 1

## 2012-07-22 NOTE — ED Notes (Signed)
Pt is aware of the need for urine sample however is unable to give on at this time.

## 2012-07-22 NOTE — ED Notes (Signed)
RUE:[id number]<JW> Expected date:<BR> Expected time:<BR> Means of arrival:<BR> Comments:<BR> Laural Benes

## 2012-07-22 NOTE — ED Notes (Signed)
Patient states that he has had abdominal pain and back pain with nausea for a week. States he was seen in the ER at [facility] last Monday night and diagnosed with a virus. Indicates lower abdomen as area of pain. Patient reports he has an enlarged spleen.

## 2012-07-22 NOTE — ED Provider Notes (Signed)
Patient is resting comfortably. He reports intermittent abdominal pain, with vomiting for a year. The current episode started 3 days ago. He does not see his primary care Dr. regularly. He has seen a Dr. [facility] for PCP services.   CT scan negative. Vital signs, negative. Labs are non-diagnostic.  Medical decision making: Nonspecific abdominal pain and vomiting, recurrent.Doubt metabolic instability, serious bacterial infection or impending vascular collapse; the patient is stable for discharge.   Plan: Home Medications- Percocet, Zofran; Home Treatments- gradually advance diet; Recommended follow up- PCP 1 week for check up  Flint Melter, MD 07/22/12 1055

## 2012-07-22 NOTE — ED Provider Notes (Signed)
History     CSN: [id number]  Arrival date & time 07/22/12  0507   First MD Initiated Contact with Patient 07/22/12 [phone]      Chief Complaint  Patient presents with  . Abdominal Pain    (Consider location/radiation/quality/duration/timing/severity/associated sxs/prior treatment) HPI Comments: The patient is a 41 year old male with a history of an enlarged spleen and a hernia repair who presents with a complaint of lower abdominal pain. He states that this started approximately 10 days ago, is intermittent and feels this twice a day. Overnight it has become much worse and persistent in the lower abdomen. It is associated with nausea vomiting and diaphoresis which he has had overnight. He does relate that he has had some intermittent change in the color of his urine from orange to red. The pain does radiate to his back but not to his testicles. He has no testicular pain, no urethral discharge, no diarrhea or constipation or blood in his stools. He denies any history of appendicitis, cholecystitis or aneurysm. Currently the pain is moderate, it is gradually getting worse.  He was evaluated at an outside hospital approximately 4 days ago and was told that there was no specific findings on his chest x-ray or laboratory work. He does report having an enlarged spleen that was identified approximately one year ago when he became "sick".  He has been worked up for the splenomegaly in the past including a bone marrow aspirate done by the oncologist which according to the chart review showed no significant findings other than lots of Megakaryocytes.    The history is provided by the patient and medical records.    Past Medical History  Diagnosis Date  . Spleen enlarged     Past Surgical History  Procedure Date  . Hernia repair     No family history on file.  History  Substance Use Topics  . Smoking status: Current Everyday Smoker -- 0.5 packs/day    Types: Cigarettes  . Smokeless tobacco:  Not on file  . Alcohol Use: No      Review of Systems  All other systems reviewed and are negative.    Allergies  Review of patient's allergies indicates no known allergies.  Home Medications   Current Outpatient Rx  Name Route Sig Dispense Refill  . ASPIRIN 325 MG PO TABS Oral Take 325 mg by mouth daily.    Marland Kitchen ONDANSETRON HCL 8 MG PO TABS Oral Take 1 tablet (8 mg total) by mouth every 8 (eight) hours as needed for nausea. 20 tablet 0  . OXYCODONE-ACETAMINOPHEN 5-325 MG PO TABS Oral Take 1 tablet by mouth every 4 (four) hours as needed for pain. 15 tablet 0    BP 109/69  Pulse 60  Temp 97.9 F (36.6 C) (Oral)  Resp 12  SpO2 97%  Physical Exam  Nursing note and vitals reviewed. Constitutional: He appears well-developed and well-nourished.       Uncomfortable appearing  HENT:  Head: Normocephalic and atraumatic.  Mouth/Throat: Oropharynx is clear and moist. No oropharyngeal exudate.  Eyes: Conjunctivae and EOM are normal. Pupils are equal, round, and reactive to light. Right eye exhibits no discharge. Left eye exhibits no discharge. No scleral icterus.  Neck: Normal range of motion. Neck supple. No JVD present. No thyromegaly present.  Cardiovascular: Normal rate, regular rhythm, normal heart sounds and intact distal pulses.  Exam reveals no gallop and no friction rub.   No murmur heard. Pulmonary/Chest: Effort normal and breath sounds normal. No respiratory  distress. He has no wheezes. He has no rales.  Abdominal: Soft. Bowel sounds are normal. He exhibits no distension and no mass. There is tenderness ( Mild bilateral lower abdominal tenderness, no pain at McBurney's point, no guarding, no peritoneal signs).       No tenderness to palpation in the upper abdomen including the right upper quadrant or epigastrium or left upper quadrant. No hepatosplenomegaly  Musculoskeletal: Normal range of motion. He exhibits no edema and no tenderness.  Lymphadenopathy:    He has no  cervical adenopathy.  Neurological: He is alert. Coordination normal.  Skin: Skin is warm and dry. No rash noted. No erythema.  Psychiatric: He has a normal mood and affect. His behavior is normal.    ED Course  Procedures (including critical care time)  Labs Reviewed  CBC WITH DIFFERENTIAL - Abnormal; Notable for the following:    MCHC 36.2 (*)     Platelets 145 (*)     All other components within normal limits  POCT I-STAT, CHEM 8 - Abnormal; Notable for the following:    Potassium 3.2 (*)     Glucose, Bld 107 (*)     All other components within normal limits  URINALYSIS, ROUTINE W REFLEX MICROSCOPIC  HEPATIC FUNCTION PANEL  LIPASE, BLOOD   Ct Abdomen Pelvis W Contrast  07/22/2012  *RADIOLOGY REPORT*  Clinical Data: Back pain, low abdominal pain  CT ABDOMEN AND PELVIS WITH CONTRAST  Technique:  Multidetector CT imaging of the abdomen and pelvis was performed following the standard protocol during bolus administration of intravenous contrast.  Contrast: OMNIPAQUE IOHEXOL 300 MG/ML  SOLN CT 07/12/2012  Comparison: CT 07/12/2012  Findings: Lung bases are clear.  No pericardial fluid.  No focal hepatic lesion.  The gallbladder, pancreas, spleen, adrenal glands, and kidneys are normal.  The stomach, small bowel, appendix, and cecum are normal.  Colon rectosigmoid colon are normal.  Abdominal aorta normal caliber.  No retroperitoneal or periportal lymphadenopathy. There is a broad-based disc bulge at L5-S1 and facet disease on the right.  No free fluid the pelvis.  Prostate gland and bladder normal.  No distal ureteral stones or bladder stones.  No pelvic lymphadenopathy. Review of  bone windows demonstrates no aggressive osseous lesions.  IMPRESSION: 1.  No explanation for abdominal pain. 2.  No nephrolithiasis  or ureterolithiasis. 3.  Normal appendix 4.  Broad-based disc bulge at L5-S1.   Original Report Authenticated By: Genevive Bi, M.D.      1. Abdominal pain   2. Nausea and  vomiting       MDM  The patient does appear uncomfortable however his vital signs are fairly normal. He will require pain medication, nausea medication and evaluation for lower abdominal pathology including kidney stone. Blood work pending, urinalysis pending.  Change of shift - CT scan pending - care signed out to Dr. Effie Shy.  Pt reevaluated prior to shift change and has mild lower abd ttp - states that his pain totally resolved after meds but has slowly returned.  Labs overall unremarkable.      Vida Roller, MD 07/22/12 [phone]

## 2013-05-24 ENCOUNTER — Emergency Department (HOSPITAL_COMMUNITY): Payer: BC Managed Care – PPO

## 2013-05-24 ENCOUNTER — Emergency Department (HOSPITAL_COMMUNITY)
Admission: EM | Admit: 2013-05-24 | Discharge: 2013-05-24 | Disposition: A | Discharge status: Home / Self Care | Payer: BC Managed Care – PPO | Attending: Emergency Medicine | Admitting: Emergency Medicine

## 2013-05-24 ENCOUNTER — Encounter (HOSPITAL_COMMUNITY): Payer: Self-pay | Admitting: Emergency Medicine

## 2013-05-24 DIAGNOSIS — R079 Chest pain, unspecified: Secondary | ICD-10-CM | POA: Insufficient documentation

## 2013-05-24 DIAGNOSIS — R112 Nausea with vomiting, unspecified: Secondary | ICD-10-CM | POA: Insufficient documentation

## 2013-05-24 DIAGNOSIS — F17200 Nicotine dependence, unspecified, uncomplicated: Secondary | ICD-10-CM | POA: Insufficient documentation

## 2013-05-24 DIAGNOSIS — R109 Unspecified abdominal pain: Secondary | ICD-10-CM | POA: Insufficient documentation

## 2013-05-24 DIAGNOSIS — R7989 Other specified abnormal findings of blood chemistry: Secondary | ICD-10-CM | POA: Insufficient documentation

## 2013-05-24 DIAGNOSIS — Z7982 Long term (current) use of aspirin: Secondary | ICD-10-CM | POA: Insufficient documentation

## 2013-05-24 DIAGNOSIS — R51 Headache: Secondary | ICD-10-CM | POA: Insufficient documentation

## 2013-05-24 LAB — CBC WITH DIFFERENTIAL/PLATELET
Basophils Relative: 0 % (ref 0–1)
HCT: 48.5 % (ref 39.0–52.0)
Hemoglobin: 17.2 g/dL — ABNORMAL HIGH (ref 13.0–17.0)
Lymphs Abs: 1.9 10*3/uL (ref 0.7–4.0)
MCHC: 35.5 g/dL (ref 30.0–36.0)
Monocytes Absolute: 0.6 10*3/uL (ref 0.1–1.0)
Monocytes Relative: 7 % (ref 3–12)
Neutro Abs: 5.5 10*3/uL (ref 1.7–7.7)
RBC: 6 MIL/uL — ABNORMAL HIGH (ref 4.22–5.81)

## 2013-05-24 LAB — COMPREHENSIVE METABOLIC PANEL
ALT: 86 U/L — ABNORMAL HIGH (ref 0–53)
Albumin: 5.4 g/dL — ABNORMAL HIGH (ref 3.5–5.2)
Alkaline Phosphatase: 85 U/L (ref 39–117)
BUN: 9 mg/dL (ref 6–23)
Calcium: 10.4 mg/dL (ref 8.4–10.5)
Potassium: 3.1 mEq/L — ABNORMAL LOW (ref 3.5–5.1)
Sodium: 138 mEq/L (ref 135–145)
Total Protein: 8.8 g/dL — ABNORMAL HIGH (ref 6.0–8.3)

## 2013-05-24 LAB — POCT I-STAT TROPONIN I: Troponin i, poc: 0 ng/mL (ref 0.00–0.08)

## 2013-05-24 LAB — LIPASE, BLOOD: Lipase: 31 U/L (ref 11–59)

## 2013-05-24 IMAGING — CT CT ANGIO CHEST
1 of 3 series · 20 of 32 positions shown · IV contrast (OMNIPAQUE 300)
Comparison: Radiographs dated [DATE] and CT scan of the abdomen
and pelvis dated [DATE]

CLINICAL DATA: Extreme mid chest pain.  Nausea.  Right lower
quadrant pain.

CT ANGIOGRAPHY CHEST
TECHNIQUE: Multidetector CT imaging of the chest using the
standard protocol during bolus administration of intravenous
contrast. Multiplanar reconstructed images including MIPs were
obtained and reviewed to evaluate the vascular anatomy.
Contrast: 100mL OMNIPAQUE IOHEXOL 350 MG/ML SOLN

[Series 5: arterial 3.0 b30f · axial · arterial · 0.84mm/px · z∈[+1132,+1759]mm · 20 of 231 slices shown]
[im 11/231  lung]
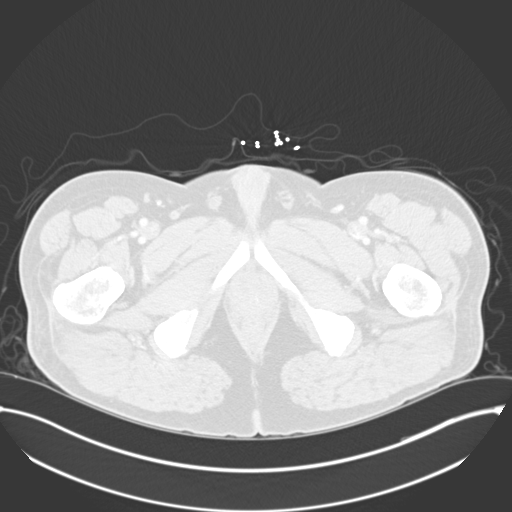
[im 21/231  soft-tissue]
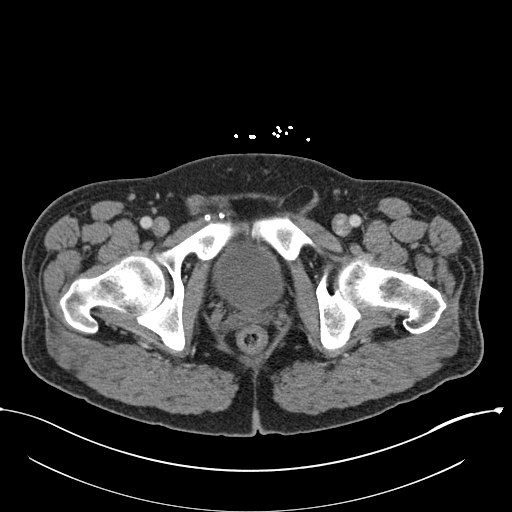
[im 32/231  lung]
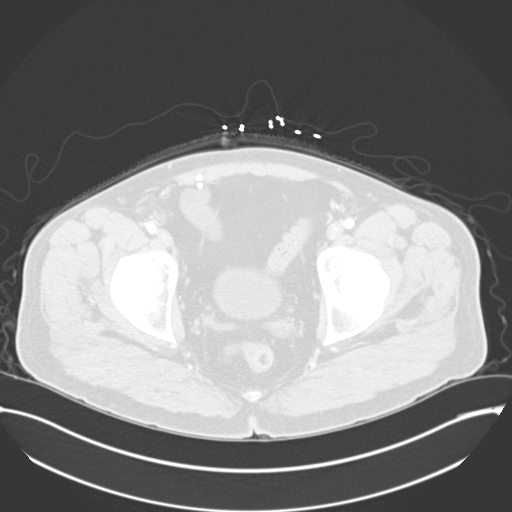
[im 42/231  soft-tissue]
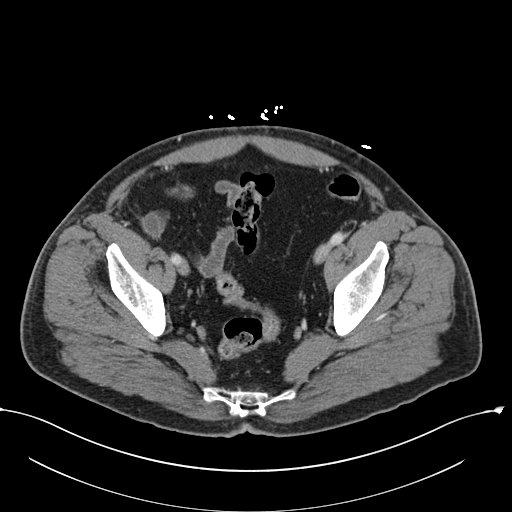
[im 53/231  lung]
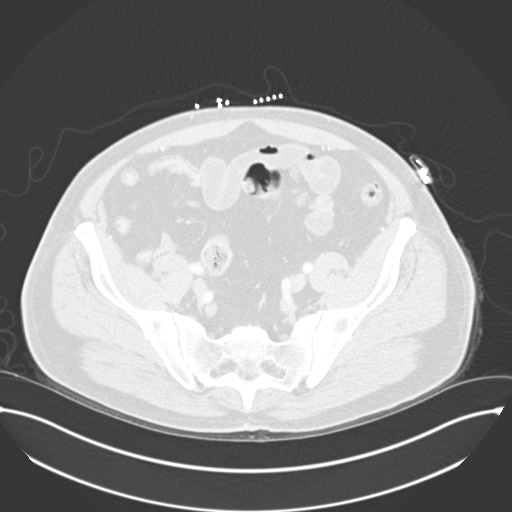
[im 63/231  soft-tissue]
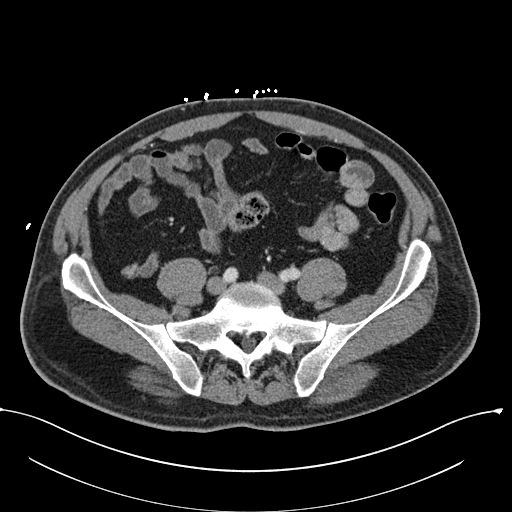
[im 74/231  lung]
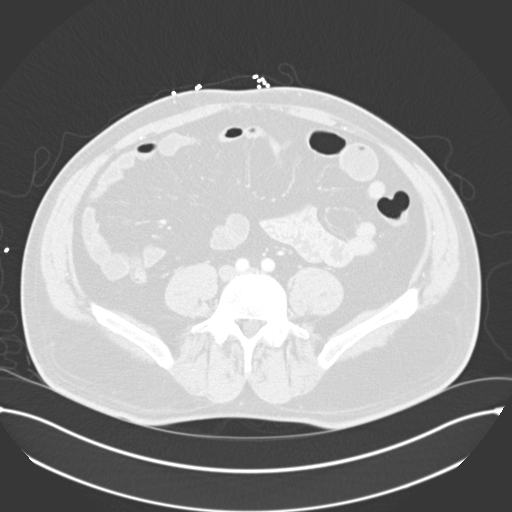
[im 84/231  soft-tissue]
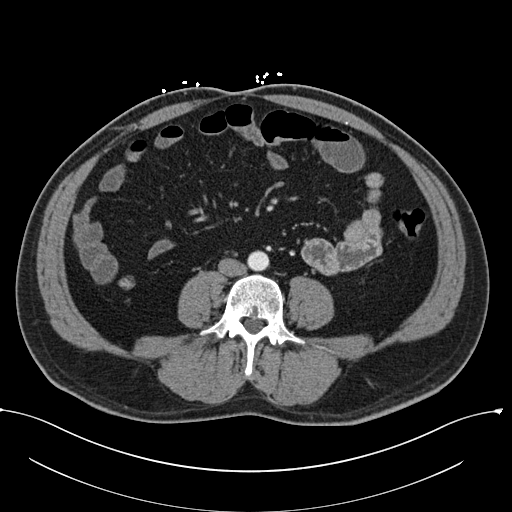
[im 95/231  lung]
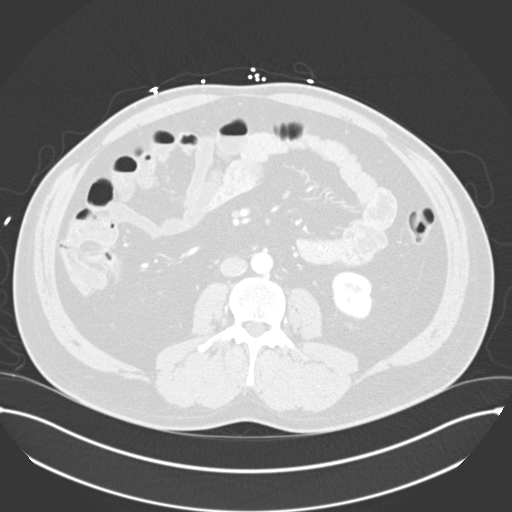
[im 105/231  soft-tissue]
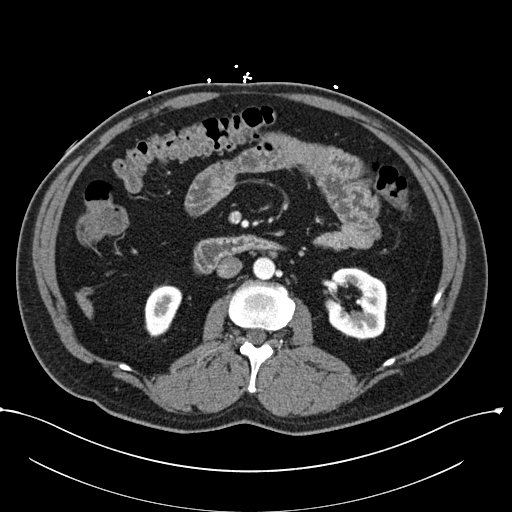
[im 126/231  lung]
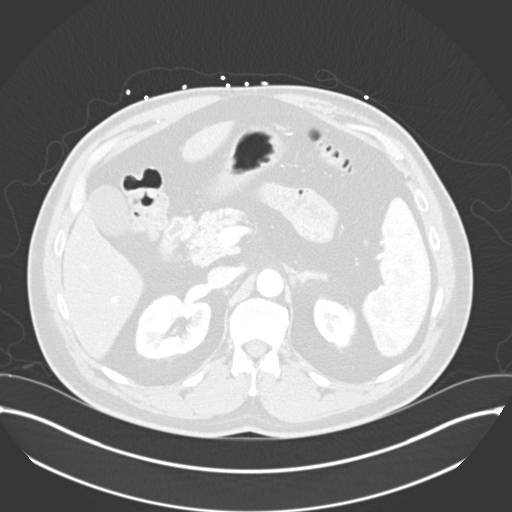
[im 136/231  soft-tissue]
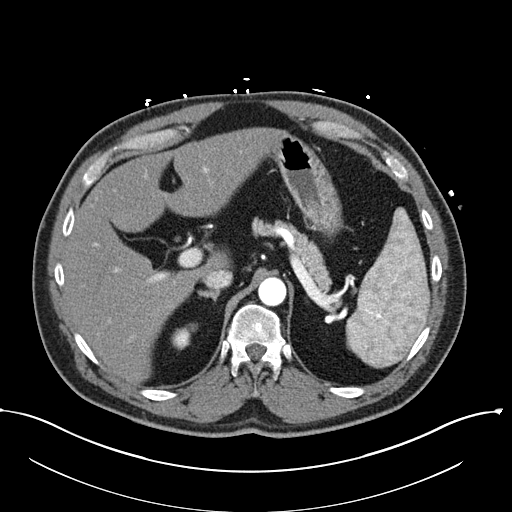
[im 147/231  lung]
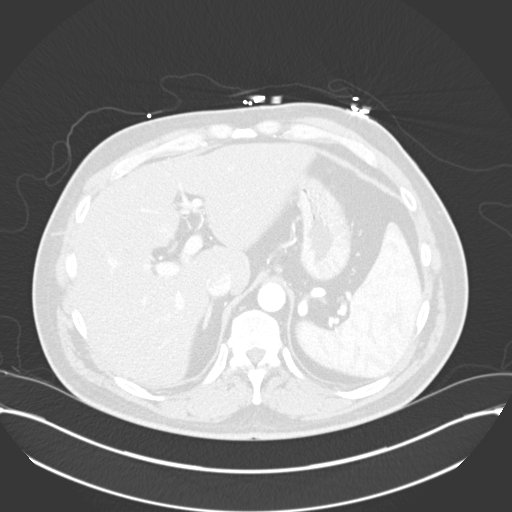
[im 157/231  soft-tissue]
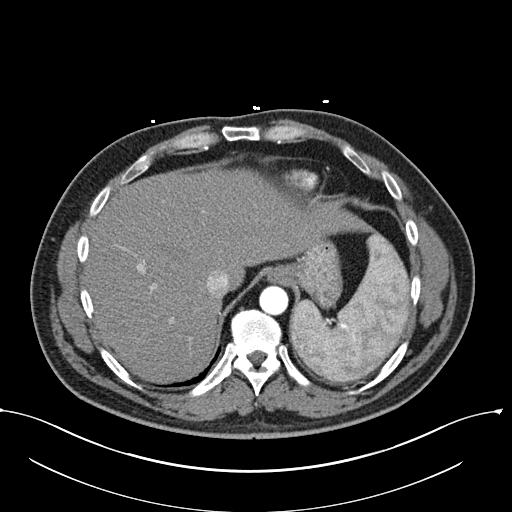
[im 168/231  lung]
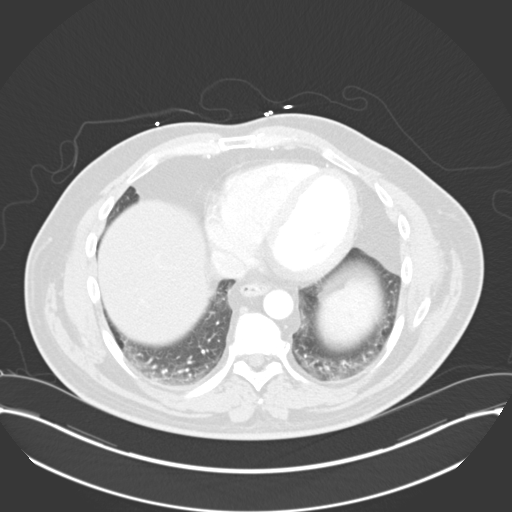
[im 178/231  soft-tissue]
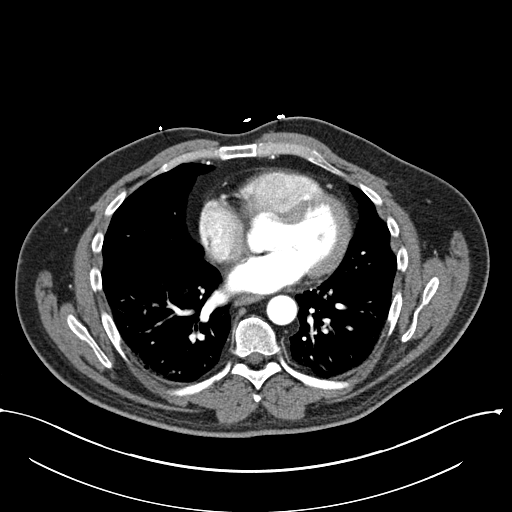
[im 189/231  lung]
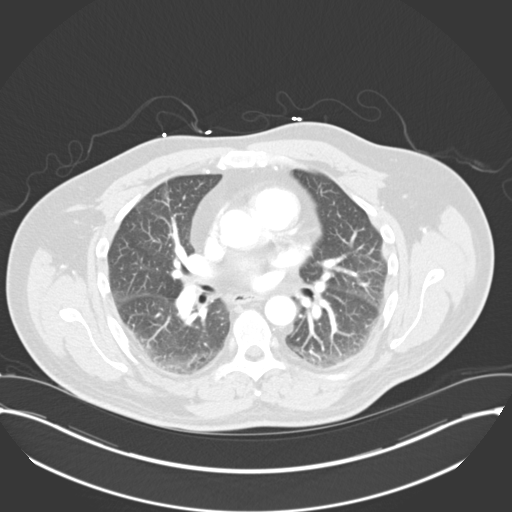
[im 199/231  soft-tissue]
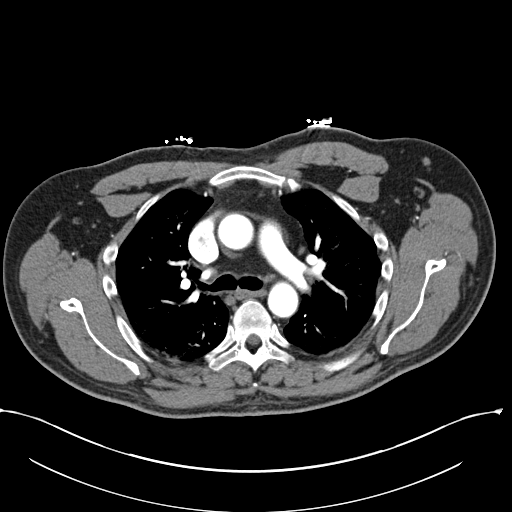
[im 210/231  lung]
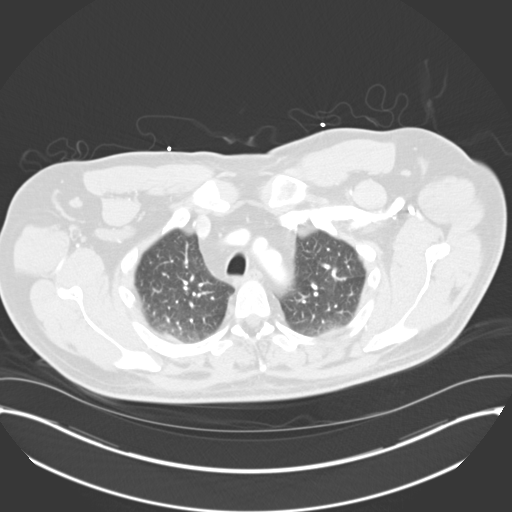
[im 220/231  soft-tissue]
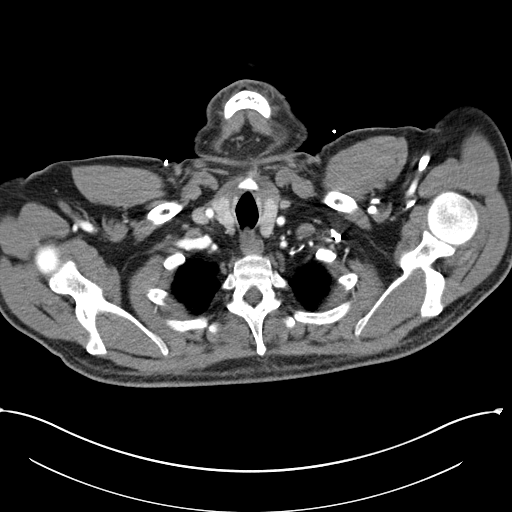

[20 of 32 positions shown; findings below may reference images not displayed]

FINDINGS: The thoracic aorta and heart appear normal.  No evidence
of aortic dissection.  No pulmonary emboli.  The lungs are clear.
No osseous abnormality.
IMPRESSION: Normal CT of the chest.

## 2013-05-24 IMAGING — CR DG ABDOMEN ACUTE W/ 1V CHEST
5 series · 5 of 5 positions shown · non-contrast
Comparison: CT abdomen pelvis dated [DATE]

CLINICAL DATA: Chest/abdominal pain, vomiting

ACUTE ABDOMEN SERIES (ABDOMEN 2 VIEW & CHEST 1 VIEW)

[w abdomen decub (1 of 2)]
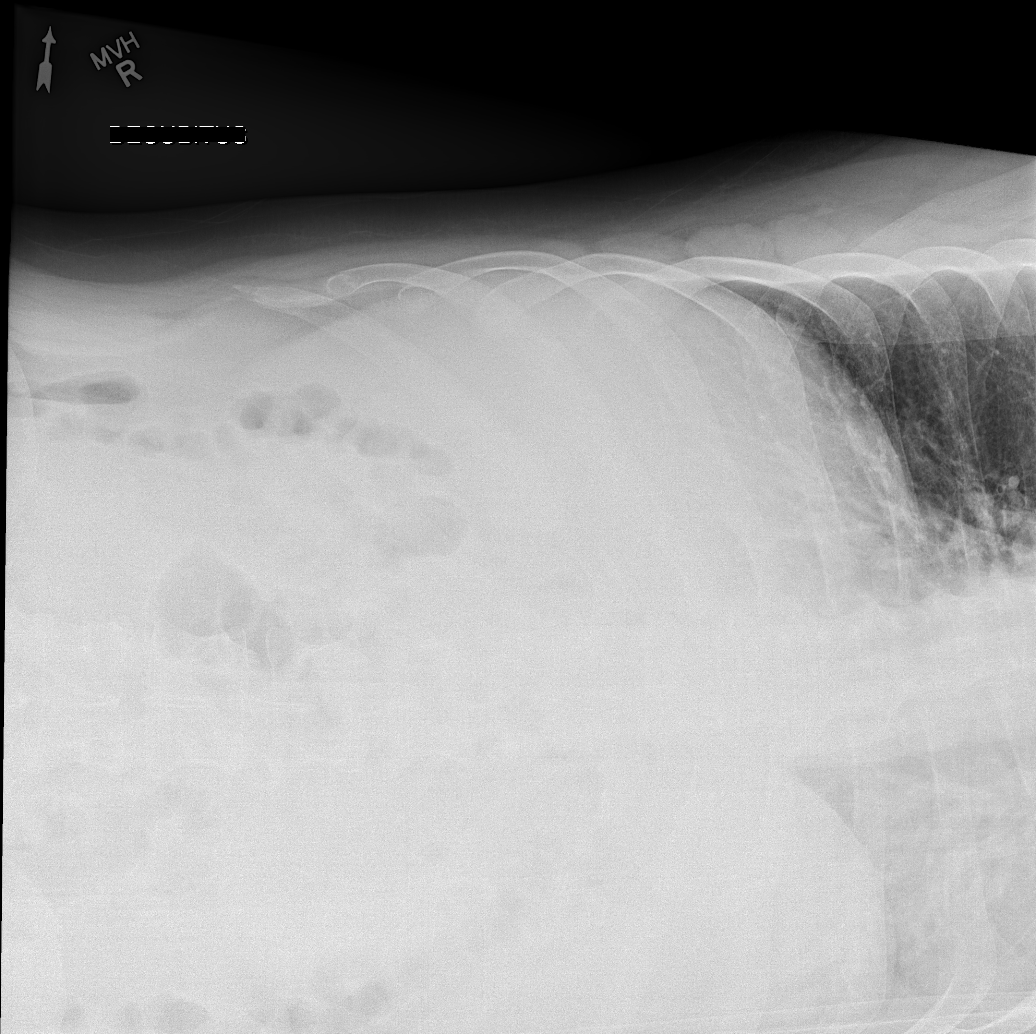

[w abdomen decub (2 of 2)]
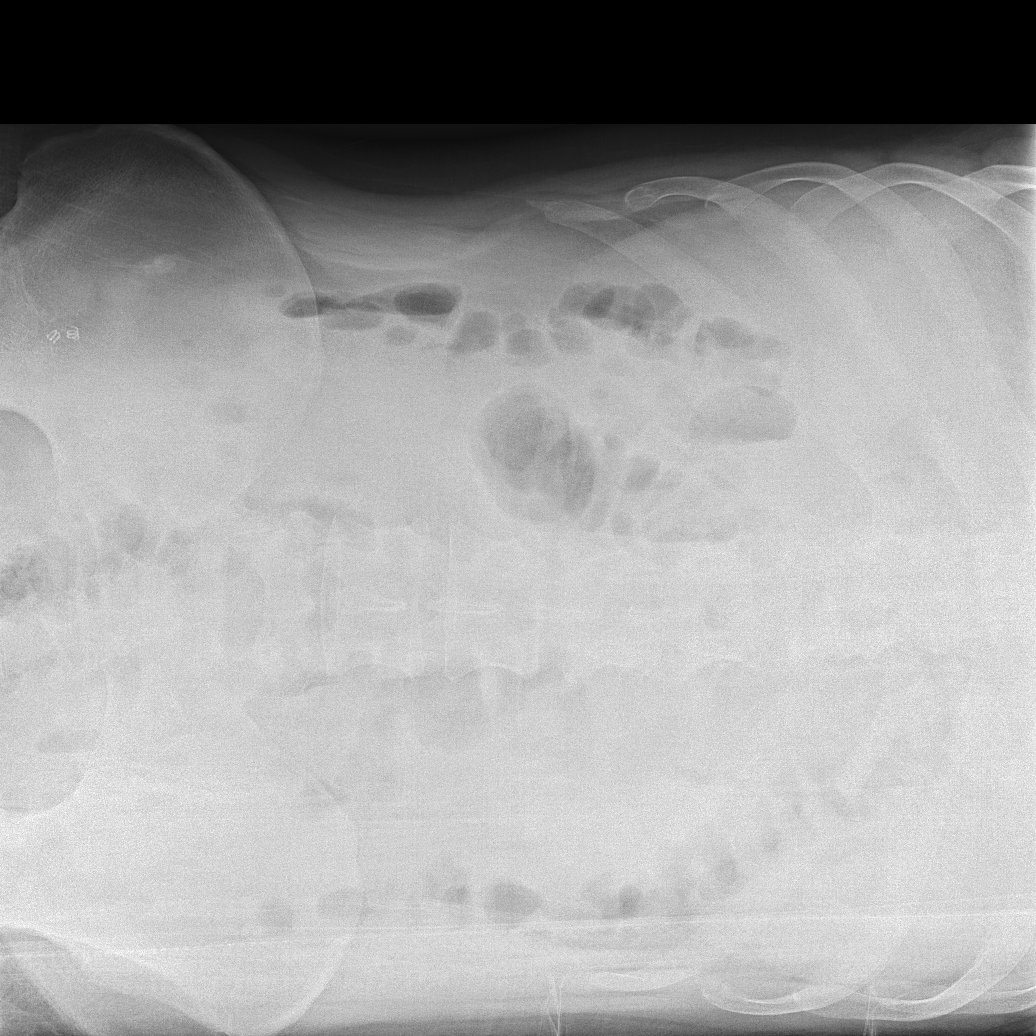

[x abdomen supine (1 of 2)]
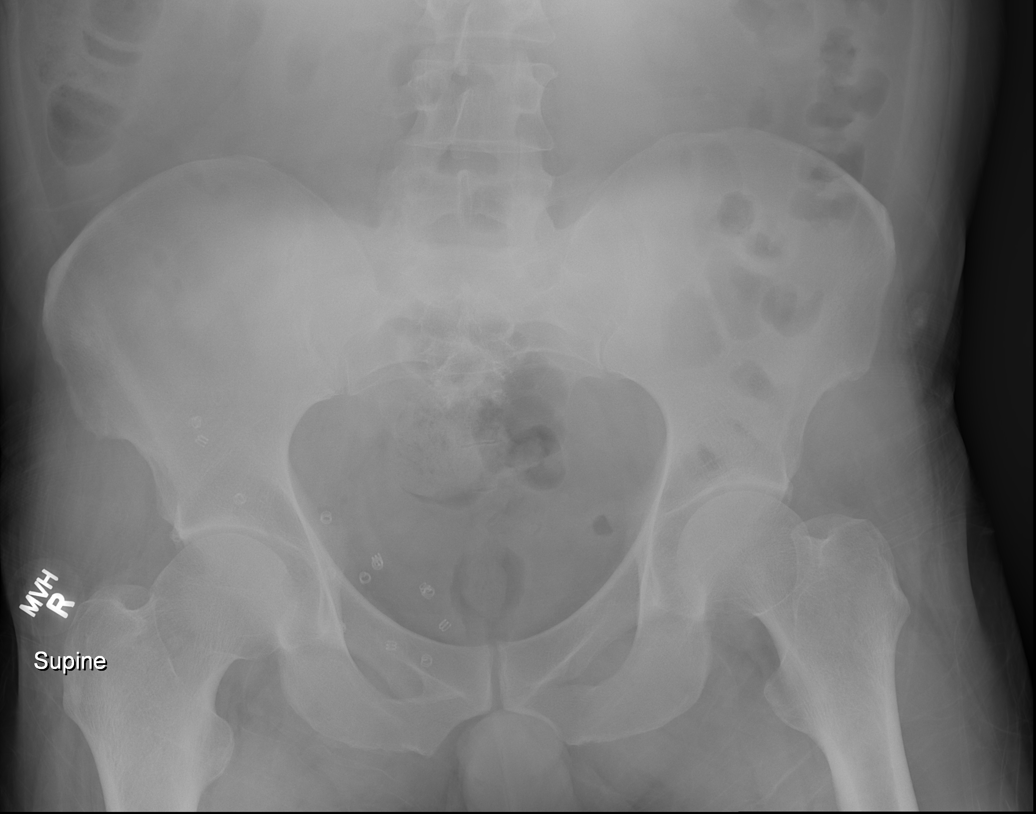

[x abdomen supine (2 of 2)]
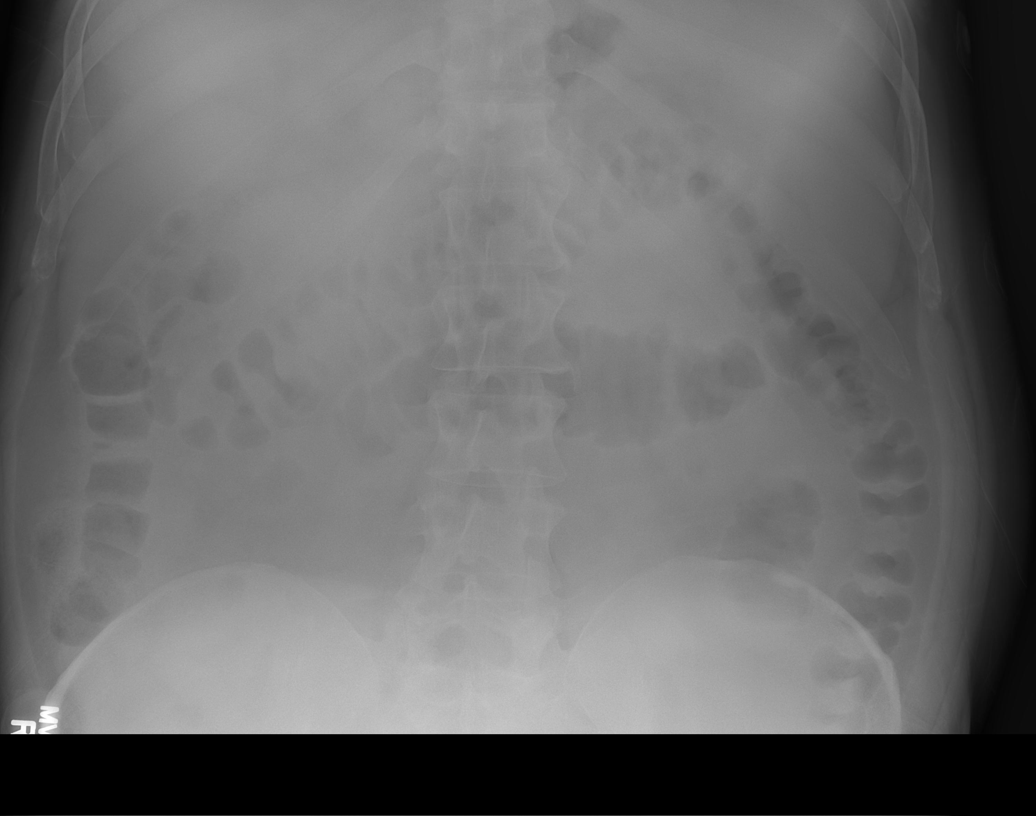

[x chest ap]
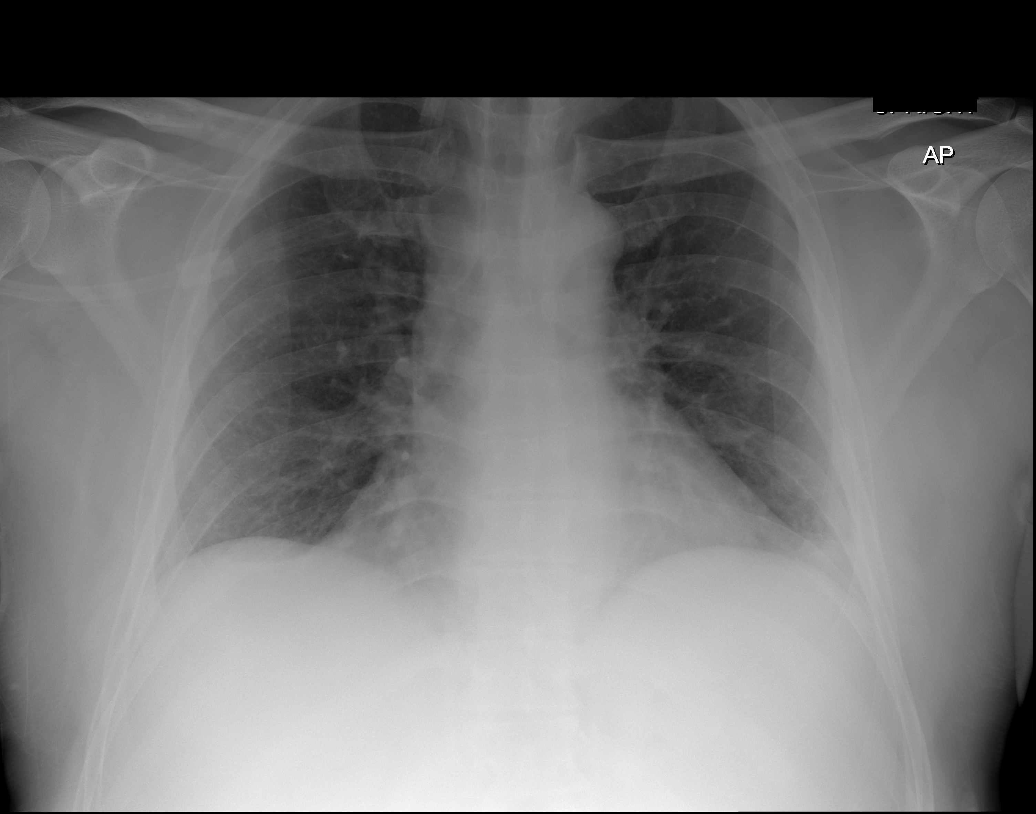

[5 of 5 positions shown; findings below may reference images not displayed]

FINDINGS: Mild patchy opacity at the lateral left lung base,
atelectasis versus pneumonia.

Mild cardiomegaly.

Nonspecific bowel gas pattern without disproportionate small bowel
dilatation to suggest small bowel obstruction.

No evidence of free air on the lateral decubitus view.

Right inguinal hernia mesh repair.
IMPRESSION: Mild patchy opacity at the left lung base, atelectasis versus
pneumonia.

No evidence of bowel obstruction or free air.

## 2013-05-24 MED ORDER — ONDANSETRON HCL 4 MG/2ML IJ SOLN
4.0000 mg | Freq: Once | INTRAMUSCULAR | Status: AC
  Administered 2013-05-24: 4 mg via INTRAVENOUS
  Filled 2013-05-24: qty 2

## 2013-05-24 MED ORDER — SODIUM CHLORIDE 0.9 % IV SOLN
1000.0000 mL | Freq: Once | INTRAVENOUS | Status: AC
  Administered 2013-05-24: 1000 mL via INTRAVENOUS

## 2013-05-24 MED ORDER — SODIUM CHLORIDE 0.9 % IV SOLN
1000.0000 mL | INTRAVENOUS | Status: DC
  Administered 2013-05-24: 1000 mL via INTRAVENOUS

## 2013-05-24 MED ORDER — IOHEXOL 350 MG/ML SOLN
100.0000 mL | Freq: Once | INTRAVENOUS | Status: AC | PRN
  Administered 2013-05-24: 100 mL via INTRAVENOUS

## 2013-05-24 MED ORDER — GI COCKTAIL ~~LOC~~
30.0000 mL | Freq: Once | ORAL | Status: AC
  Administered 2013-05-24: 30 mL via ORAL
  Filled 2013-05-24: qty 30

## 2013-05-24 MED ORDER — HYDROMORPHONE HCL PF 1 MG/ML IJ SOLN
0.5000 mg | INTRAMUSCULAR | Status: DC | PRN
  Administered 2013-05-24 (×2): 0.5 mg via INTRAVENOUS
  Filled 2013-05-24 (×2): qty 1

## 2013-05-24 NOTE — ED Notes (Signed)
Patient transported to CT 

## 2013-05-24 NOTE — Progress Notes (Signed)
Pt confirms pcp is [facility]- dr Farris Has EPIC updated

## 2013-05-24 NOTE — ED Notes (Signed)
EKG given to Dr. Knapp. 

## 2013-05-24 NOTE — ED Notes (Signed)
Pt states that when he woke up this morning, he began having chest pain and was dizzy.  States that he was on his way to his parent's house this morning because his mother had fallen.  While he was driving there, he began having crushing chest pain and vomiting.  Pt states that he is also having abd pain in the RUQ.  Denies cardiac hx.

## 2013-05-24 NOTE — ED Provider Notes (Signed)
History  CSN: [id number] Arrival date & time 05/24/13  4098 First MD Initiated Contact with Patient 05/24/13 [phone]     Chief complaint: Chest pain vomiting  HPI The patient presents to the emergency room with complaints of chest pain and vomiting. Patient states yesterday evening he started having a migraine headache. He did have a few episodes of nausea and vomiting. This was not particularly out of the ordinary for him. He was concerned however when he noticed some blood in his emesis. Today he was driving to see his family when he developed the sudden onset of sharp crushing 7/10 pain in the center of his chest.  The pain does not radiate to his back. He was experiencing nausea and vomiting as well as some shortness of breath. Patient states he does have some pain as well in his upper abdomen. His chest also does hurt when it is palpated. Denies any similar episodes. He denies heart disease, vascular or lung disease.  Patient states his headache has improved.  Past Medical History  Diagnosis Date  . Spleen enlarged    Past Surgical History  Procedure Laterality Date  . Hernia repair     No family history on file. History  Substance Use Topics  . Smoking status: Current Every Day Smoker -- 0.50 packs/day    Types: Cigarettes  . Smokeless tobacco: Not on file  . Alcohol Use: No    Review of Systems  Constitutional: Negative for fever.  HENT: Negative for neck pain.   Eyes: Negative for visual disturbance.  Gastrointestinal: Negative for abdominal distention.  Neurological: Positive for headaches. Negative for seizures, syncope, speech difficulty and numbness.  All other systems reviewed and are negative.    Allergies  Review of patient's allergies indicates no known allergies.  Home Medications   Current Outpatient Rx  Name  Route  Sig  Dispense  Refill  . aspirin 325 MG tablet   Oral   Take 325 mg by mouth daily.         Marland Kitchen ibuprofen (ADVIL,MOTRIN) 200 MG tablet  Oral   Take 200 mg by mouth every 6 (six) hours as needed for pain (pain).          BP 147/82  Pulse 78  Temp(Src) 98.8 F (37.1 C) (Oral)  Resp 19  SpO2 100% Physical Exam  Nursing note and vitals reviewed. Constitutional: He appears distressed.  HENT:  Head: Normocephalic and atraumatic.  Right Ear: External ear normal.  Left Ear: External ear normal.  Mouth/Throat: No oropharyngeal exudate.  Eyes: Conjunctivae are normal. Right eye exhibits no discharge. Left eye exhibits no discharge. No scleral icterus.  Neck: Neck supple. No tracheal deviation present.  Cardiovascular: Normal rate, regular rhythm and intact distal pulses.   Pulmonary/Chest: Effort normal and breath sounds normal. No stridor. No respiratory distress. He has no wheezes. He has no rales. He exhibits tenderness.  Abdominal: Soft. Bowel sounds are normal. He exhibits no distension, no pulsatile midline mass and no mass. There is tenderness (mild in the right upper quadrant and epigastrium) in the right upper quadrant and epigastric area. There is no rebound and no guarding. No hernia.  Musculoskeletal: He exhibits no edema and no tenderness.  Neurological: He is alert. He has normal strength. No sensory deficit. Cranial nerve deficit:  no gross defecits noted. He exhibits normal muscle tone. He displays no seizure activity. Coordination normal.  Skin: Skin is warm and dry. No rash noted. He is not diaphoretic.  Psychiatric: He  has a normal mood and affect.    ED Course  Procedures (including critical care time) EKG A normal sinus rhythm Rate 82 Normal axis, normal intervals Normal ST-T days No significant change when compared to EKG dated 08/15/2011 Labs Reviewed  COMPREHENSIVE METABOLIC PANEL - Abnormal; Notable for the following:    Potassium 3.1 (*)    Total Protein 8.8 (*)    Albumin 5.4 (*)    AST 101 (*)    ALT 86 (*)    All other components within normal limits  CBC WITH DIFFERENTIAL - Abnormal;  Notable for the following:    RBC 6.00 (*)    Hemoglobin 17.2 (*)    All other components within normal limits  LIPASE, BLOOD  TROPONIN I  URINALYSIS, ROUTINE W REFLEX MICROSCOPIC  POCT I-STAT TROPONIN I   Ct Angio Chest W/cm &/or Wo Cm  05/24/2013   *RADIOLOGY REPORT*  Clinical Data: Extreme mid chest pain.  Nausea.  Right lower quadrant pain.  CT ANGIOGRAPHY CHEST  Technique:  Multidetector CT imaging of the chest using the standard protocol during bolus administration of intravenous contrast. Multiplanar reconstructed images including MIPs were obtained and reviewed to evaluate the vascular anatomy.  Contrast: OMNIPAQUE IOHEXOL 350 MG/ML SOLN  Comparison: Radiographs dated 05/24/2013 and CT scan of the abdomen and pelvis dated 07/12/2012  Findings: The thoracic aorta and heart appear normal.  No evidence of aortic dissection.  No pulmonary emboli.  The lungs are clear. No osseous abnormality.  IMPRESSION: Normal CT of the chest.   Original Report Authenticated By: Francene Boyers, M.D.   Dg Abd Acute W/chest  05/24/2013   *RADIOLOGY REPORT*  Clinical Data: Chest/abdominal pain, vomiting  ACUTE ABDOMEN SERIES (ABDOMEN 2 VIEW & CHEST 1 VIEW)  Comparison: CT abdomen pelvis dated 07/22/2012  Findings: Mild patchy opacity at the lateral left lung base, atelectasis versus pneumonia.  Mild cardiomegaly.  Nonspecific bowel gas pattern without disproportionate small bowel dilatation to suggest small bowel obstruction.  No evidence of free air on the lateral decubitus view.  Right inguinal hernia mesh repair.  IMPRESSION: Mild patchy opacity at the left lung base, atelectasis versus pneumonia.  No evidence of bowel obstruction or free air.   Original Report Authenticated By: Charline Bills, M.D.   Ct Angio Abd/pel W/ And/or W/o  05/24/2013   *RADIOLOGY REPORT*  Clinical Data: Severe chest pain and right upper quadrant pain.  CT ANGIOGRAPHY ABDOMEN AND PELVIS WITH CONTRAST AND WITHOUT CONTRAST  Technique:   Multidetector CT imaging of the abdomen was performed using the standard protocol during bolus administration of intravenous contrast. Multiplanar reconstructed images including MIPs were obtained and reviewed to evaluate the vascular anatomy.  Comparison: CT scan dated 07/22/2012  Findings: The abdominal aorta is normal with widely patent celiac, superior mesenteric, and inferior mesenteric arteries.  Iliac and common femoral arteries are normal.  The patient has diffuse hepatic steatosis.  Liver parenchyma is otherwise normal.  The biliary tree is normal.  Spleen, pancreas, adrenal glands, and kidneys are normal.  The bowel is normal including the terminal ileum and appendix.  No free air free fluid.  No diverticular disease.  No adenopathy.  No acute osseous abnormality.  IMPRESSION: Benign-appearing abdomen and pelvis. Normal abdominal aorta and its multiple branches.   Original Report Authenticated By: Francene Boyers, M.D.   1. Chest pain     MDM  Patient experienced sudden onset of chest pain, abdominal pain, and vomiting. The symptoms were atypical for acute coronary  syndrome. I was concerned about the possibility of aortic dissection considering the sharp intense severity of his pain as well as the acute onset. CT scan does not show any evidence of acute cardiopulmonary or abdominal pathology. It is possible symptoms could have been related to esophageal spasm considering the nausea and vomiting he was having.  At this time, there does not appear to be evidence of an acute emergency medical condition. Patient does have mild elevation in his LFTs and I recommend followup with his primary care Dr. Prince Solian findings were discussed with the patient as well as his spouse.  Celene Kras, MD 05/24/13 [phone]

## 2013-05-24 NOTE — ED Notes (Signed)
Pt attempted to provide ua sample, and was unable to urinate.  Will continue to monitor q65min.

## 2013-12-03 ENCOUNTER — Emergency Department (HOSPITAL_BASED_OUTPATIENT_CLINIC_OR_DEPARTMENT_OTHER)
Admission: EM | Admit: 2013-12-03 | Discharge: 2013-12-03 | Disposition: A | Discharge status: Home / Self Care | Payer: BC Managed Care – PPO | Attending: Emergency Medicine | Admitting: Emergency Medicine

## 2013-12-03 ENCOUNTER — Encounter (HOSPITAL_BASED_OUTPATIENT_CLINIC_OR_DEPARTMENT_OTHER): Payer: Self-pay | Admitting: Emergency Medicine

## 2013-12-03 ENCOUNTER — Emergency Department (HOSPITAL_BASED_OUTPATIENT_CLINIC_OR_DEPARTMENT_OTHER): Payer: BC Managed Care – PPO

## 2013-12-03 DIAGNOSIS — R42 Dizziness and giddiness: Secondary | ICD-10-CM | POA: Insufficient documentation

## 2013-12-03 DIAGNOSIS — R52 Pain, unspecified: Secondary | ICD-10-CM | POA: Insufficient documentation

## 2013-12-03 DIAGNOSIS — Z8719 Personal history of other diseases of the digestive system: Secondary | ICD-10-CM | POA: Insufficient documentation

## 2013-12-03 DIAGNOSIS — Z7982 Long term (current) use of aspirin: Secondary | ICD-10-CM | POA: Insufficient documentation

## 2013-12-03 DIAGNOSIS — F17200 Nicotine dependence, unspecified, uncomplicated: Secondary | ICD-10-CM | POA: Insufficient documentation

## 2013-12-03 DIAGNOSIS — R6889 Other general symptoms and signs: Secondary | ICD-10-CM

## 2013-12-03 DIAGNOSIS — K529 Noninfective gastroenteritis and colitis, unspecified: Secondary | ICD-10-CM

## 2013-12-03 DIAGNOSIS — K5289 Other specified noninfective gastroenteritis and colitis: Secondary | ICD-10-CM | POA: Insufficient documentation

## 2013-12-03 DIAGNOSIS — J111 Influenza due to unidentified influenza virus with other respiratory manifestations: Secondary | ICD-10-CM | POA: Insufficient documentation

## 2013-12-03 DIAGNOSIS — Z79899 Other long term (current) drug therapy: Secondary | ICD-10-CM | POA: Insufficient documentation

## 2013-12-03 LAB — CBC WITH DIFFERENTIAL/PLATELET
BASOS ABS: 0 10*3/uL (ref 0.0–0.1)
BASOS PCT: 0 % (ref 0–1)
EOS PCT: 0 % (ref 0–5)
Eosinophils Absolute: 0 10*3/uL (ref 0.0–0.7)
HEMATOCRIT: 43.7 % (ref 39.0–52.0)
Hemoglobin: 15.3 g/dL (ref 13.0–17.0)
Lymphocytes Relative: 5 % — ABNORMAL LOW (ref 12–46)
Lymphs Abs: 0.5 10*3/uL — ABNORMAL LOW (ref 0.7–4.0)
MCH: 28.9 pg (ref 26.0–34.0)
MCHC: 35 g/dL (ref 30.0–36.0)
MCV: 82.6 fL (ref 78.0–100.0)
MONO ABS: 1 10*3/uL (ref 0.1–1.0)
Monocytes Relative: 11 % (ref 3–12)
NEUTROS ABS: 7.6 10*3/uL (ref 1.7–7.7)
Neutrophils Relative %: 84 % — ABNORMAL HIGH (ref 43–77)
PLATELETS: 108 10*3/uL — AB (ref 150–400)
RBC: 5.29 MIL/uL (ref 4.22–5.81)
RDW: 14.9 % (ref 11.5–15.5)
WBC: 9.1 10*3/uL (ref 4.0–10.5)

## 2013-12-03 LAB — BASIC METABOLIC PANEL
BUN: 11 mg/dL (ref 6–23)
CALCIUM: 9.5 mg/dL (ref 8.4–10.5)
CHLORIDE: 96 meq/L (ref 96–112)
CO2: 26 mEq/L (ref 19–32)
CREATININE: 1 mg/dL (ref 0.50–1.35)
GFR calc non Af Amer: >90 mL/min (ref >90)
Glucose, Bld: 97 mg/dL (ref 70–99)
Potassium: 3.9 mEq/L (ref 3.7–5.3)
SODIUM: 138 meq/L (ref 137–147)

## 2013-12-03 IMAGING — CR DG CHEST 2V
2 series · 2 of 2 positions shown · non-contrast
Comparison: CT chest dated [DATE]

CLINICAL DATA: Sore throat, cough, fever, nausea/vomiting/diarrhea,
body aches

EXAM:
CHEST  2 VIEW

[w chest pa]
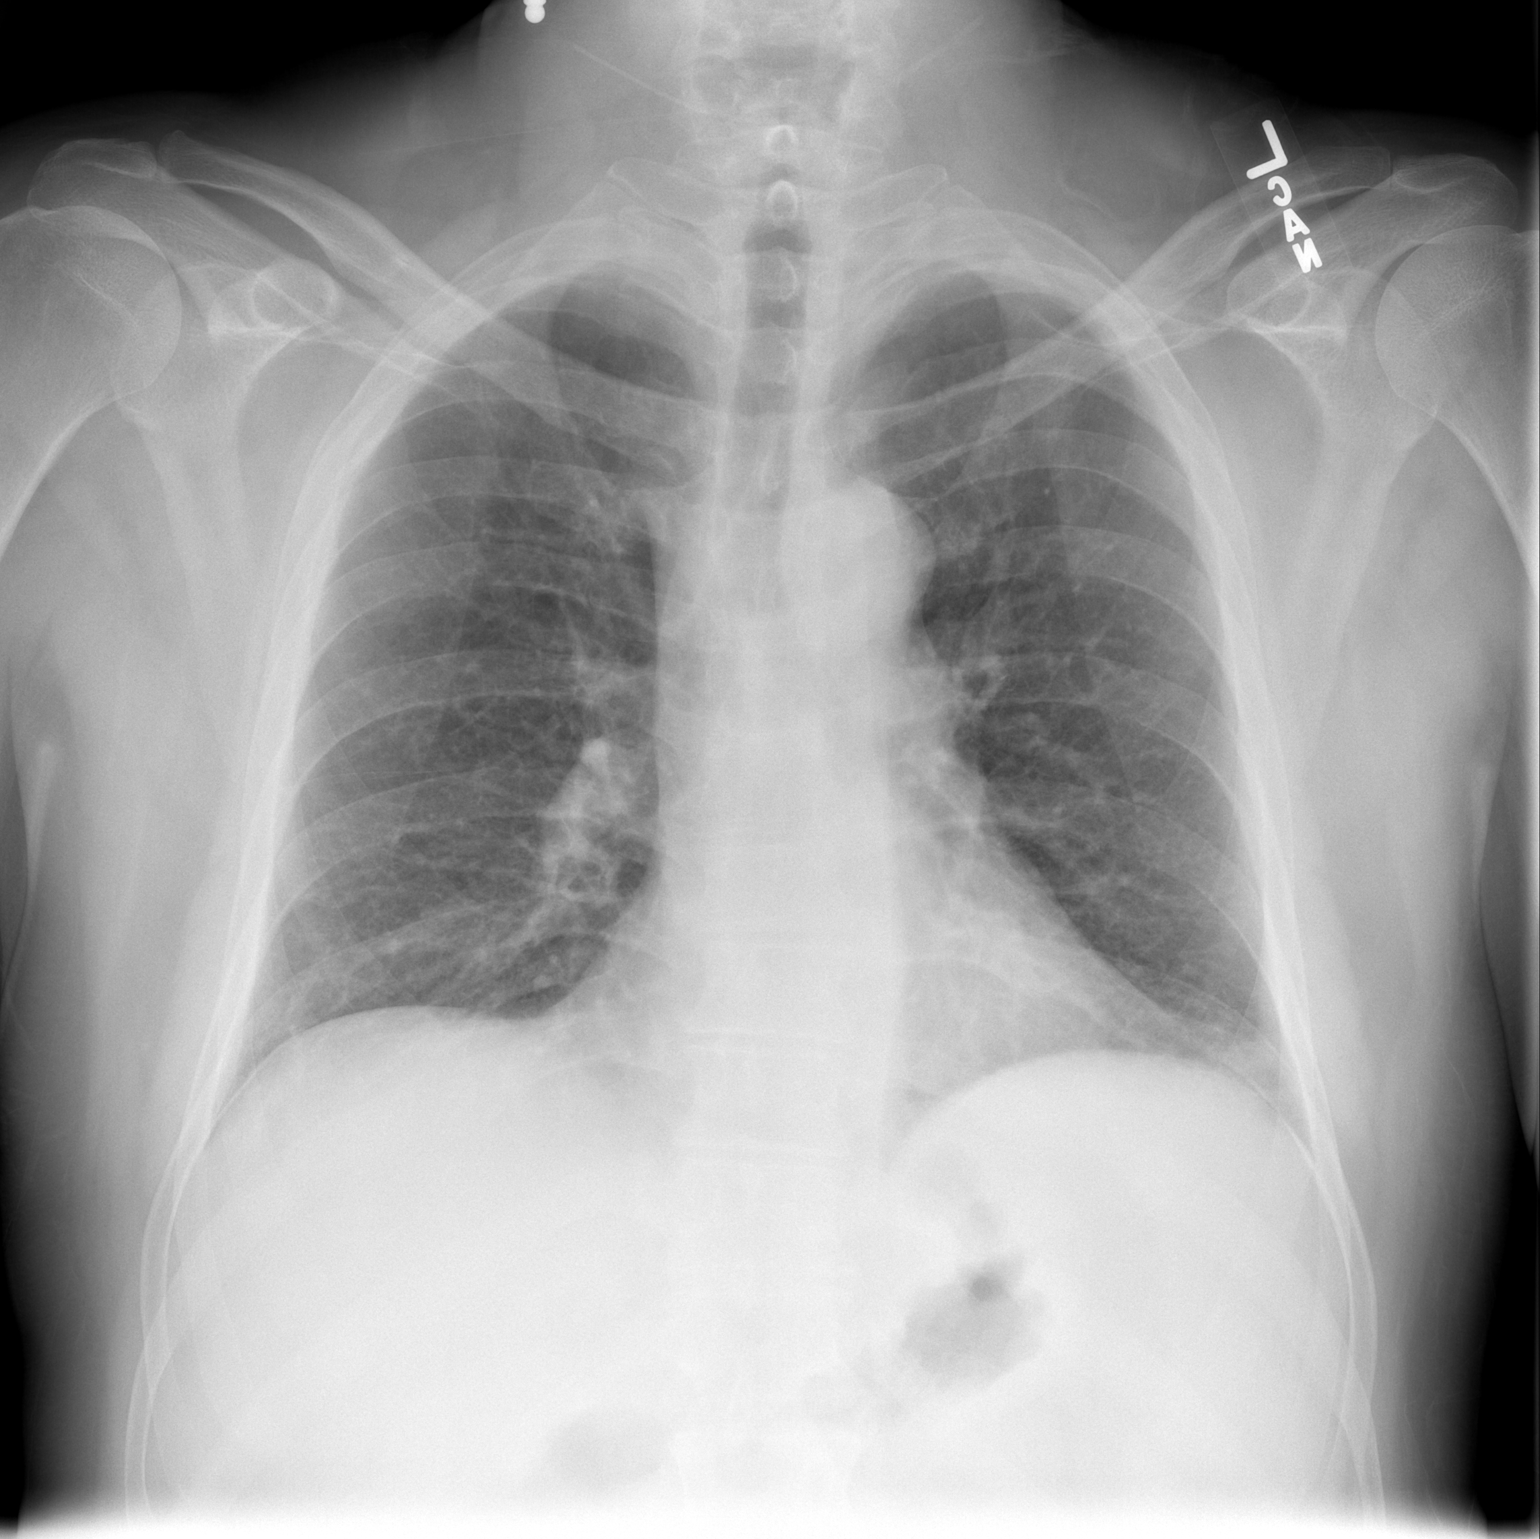

[w chest lat]
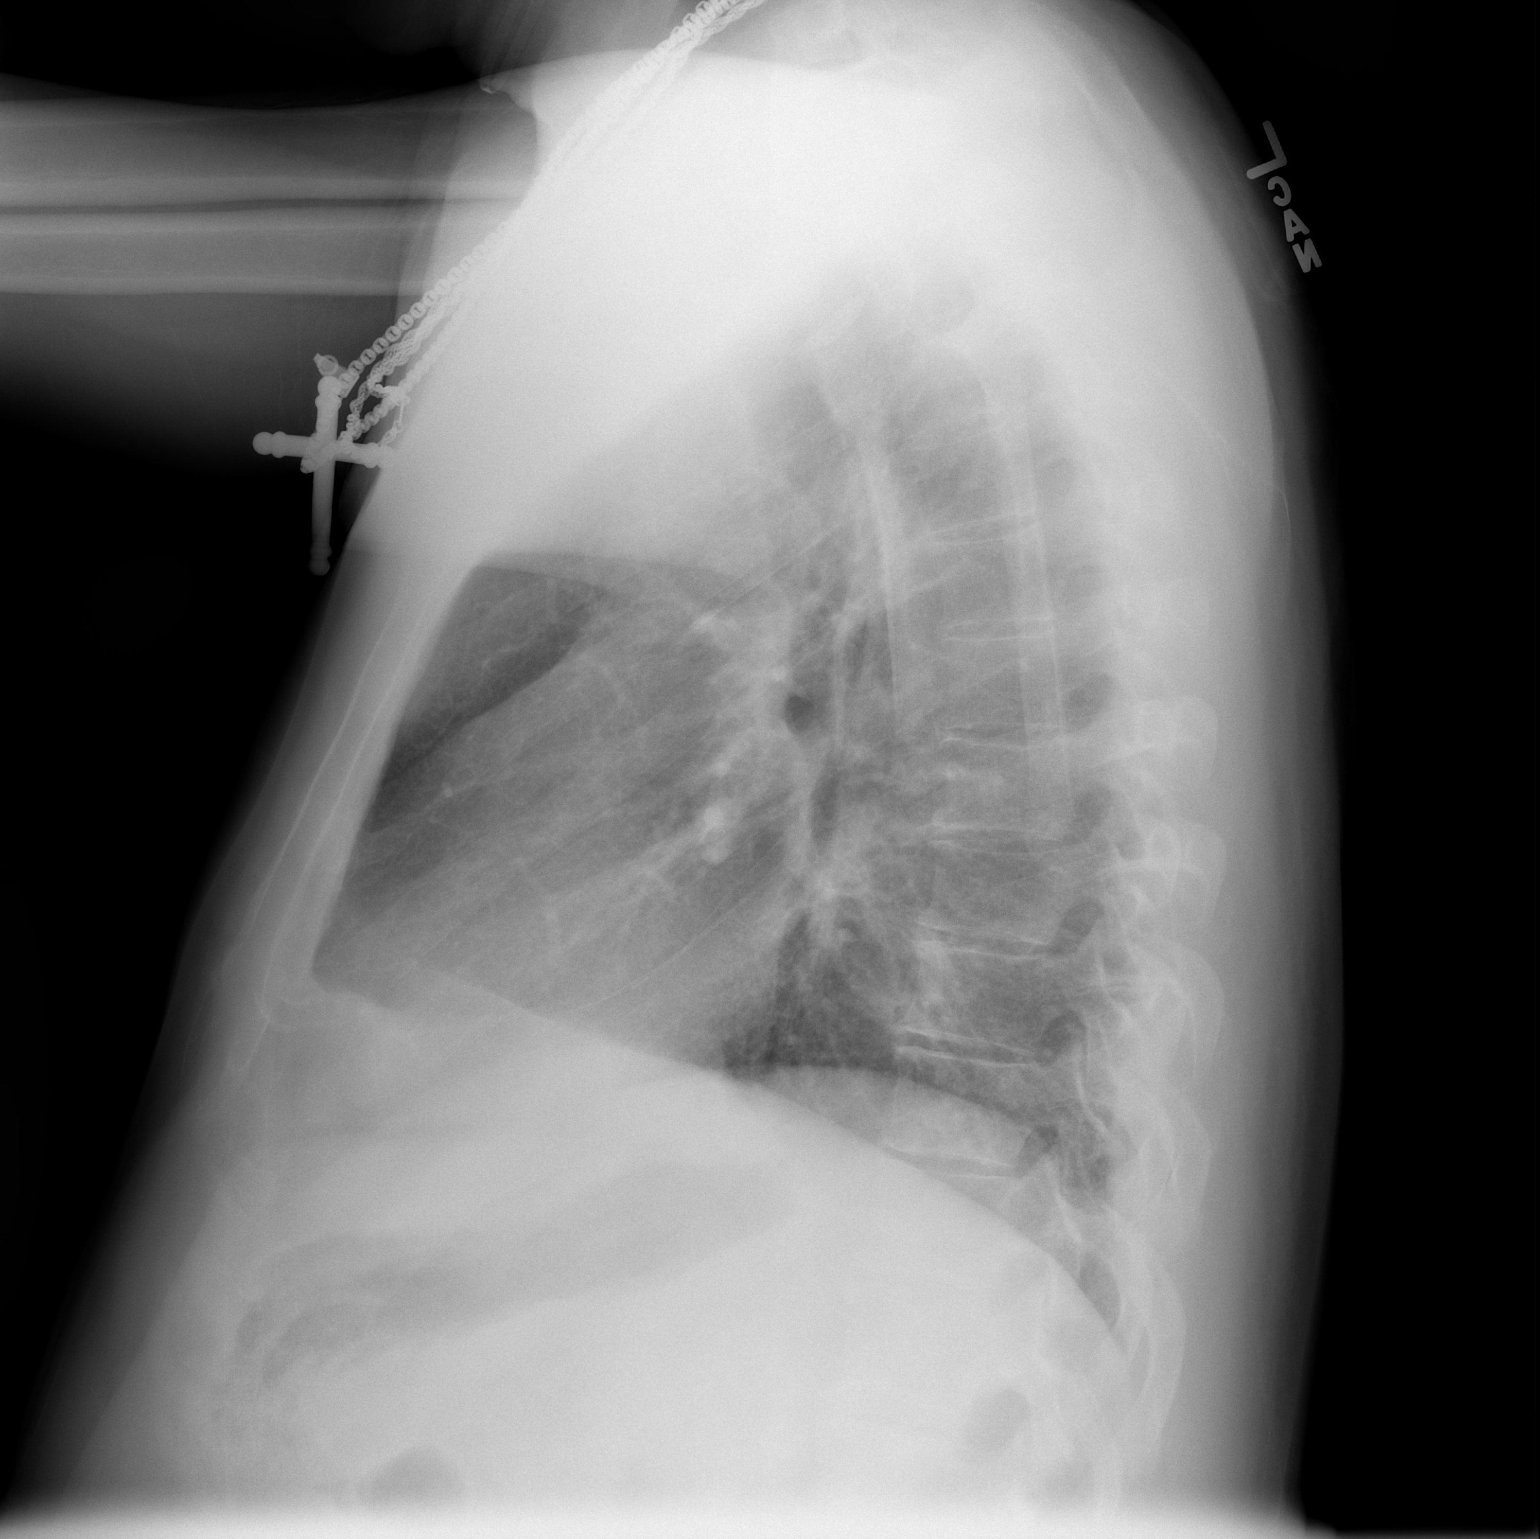

[2 of 2 positions shown; findings below may reference images not displayed]

FINDINGS: Lungs are clear. No pleural effusion or pneumothorax.

The heart is normal in size.

Mild degenerative changes of the visualized thoracolumbar spine.
IMPRESSION: No evidence of acute cardiopulmonary disease.

## 2013-12-03 MED ORDER — ALBUTEROL SULFATE (2.5 MG/3ML) 0.083% IN NEBU
5.0000 mg | INHALATION_SOLUTION | Freq: Once | RESPIRATORY_TRACT | Status: AC
  Administered 2013-12-03: 5 mg via RESPIRATORY_TRACT
  Filled 2013-12-03: qty 6

## 2013-12-03 MED ORDER — SODIUM CHLORIDE 0.9 % IV SOLN
Freq: Once | INTRAVENOUS | Status: AC
  Administered 2013-12-03: 19:00:00 via INTRAVENOUS

## 2013-12-03 MED ORDER — ONDANSETRON HCL 4 MG/2ML IJ SOLN
4.0000 mg | Freq: Once | INTRAMUSCULAR | Status: AC
  Administered 2013-12-03: 4 mg via INTRAVENOUS
  Filled 2013-12-03: qty 2

## 2013-12-03 MED ORDER — ALBUTEROL SULFATE HFA 108 (90 BASE) MCG/ACT IN AERS
2.0000 | INHALATION_SPRAY | RESPIRATORY_TRACT | Status: DC | PRN
  Administered 2013-12-03: 2 via RESPIRATORY_TRACT
  Filled 2013-12-03: qty 6.7

## 2013-12-03 MED ORDER — IPRATROPIUM BROMIDE 0.02 % IN SOLN
0.5000 mg | Freq: Once | RESPIRATORY_TRACT | Status: AC
  Administered 2013-12-03: 0.5 mg via RESPIRATORY_TRACT
  Filled 2013-12-03: qty 2.5

## 2013-12-03 MED ORDER — SODIUM CHLORIDE 0.9 % IV BOLUS (SEPSIS)
1000.0000 mL | Freq: Once | INTRAVENOUS | Status: AC
  Administered 2013-12-03: 1000 mL via INTRAVENOUS

## 2013-12-03 MED ORDER — PROMETHAZINE HCL 25 MG PO TABS: 25.0000 mg | ORAL_TABLET | Freq: Four times a day (QID) | ORAL | Status: DC | PRN

## 2013-12-03 MED ORDER — PROMETHAZINE HCL 25 MG/ML IJ SOLN
12.5000 mg | Freq: Once | INTRAMUSCULAR | Status: AC
  Administered 2013-12-03: 12.5 mg via INTRAVENOUS
  Filled 2013-12-03: qty 1

## 2013-12-03 NOTE — ED Notes (Signed)
Pt having sore throat, cough, fever, N/V/D, body aches for two days.  Pt seen yesterday, tested negative for flu, was given tamiflu and cough syrup.  Pt states he feels worse.

## 2013-12-03 NOTE — ED Notes (Signed)
Tolerating ginger ale and ice chips well.

## 2013-12-03 NOTE — ED Notes (Signed)
Unable to urinate at this time.  

## 2013-12-03 NOTE — ED Provider Notes (Signed)
CSN: [id number]     Arrival date & time 12/03/13  1656 History   First MD Initiated Contact with Patient 12/03/13 1753     Chief Complaint  Patient presents with  . Cough  . Fever  . Generalized Body Aches  . Emesis   (Consider location/radiation/quality/duration/timing/severity/associated sxs/prior Treatment) Patient is a 43 y.o. male presenting with cough, fever, and vomiting. The history is provided by the patient.  Cough Cough characteristics:  Productive and harsh Severity:  Severe Onset quality:  Gradual Duration:  2 days Timing:  Intermittent Progression:  Worsening Chronicity:  New Smoker: yes   Relieved by:  Nothing Worsened by:  Activity, lying down and smoking Associated symptoms: chills, fever, myalgias, rhinorrhea, shortness of breath, sinus congestion, sore throat and wheezing   Associated symptoms: no ear pain   Fever Associated symptoms: chills, congestion, cough, diarrhea, myalgias, nausea, rhinorrhea, sore throat and vomiting   Associated symptoms: no confusion, no dysuria and no ear pain   Emesis Associated symptoms: chills, diarrhea, myalgias and sore throat  Abdominal pain: cramping with diarrhea.    Alan Sanders is a 43 y.o. male who presents to the ED with cough, cold, congestion and feeling short of breath x 2 days. He has coughed until his stomach muscles are sore. He went to Urgent Care yesterday and had a negative influenza screen but was started on Tamiflu and cough medication. He continues to cough and today has nausea, vomiting and diarrhea.  Past Medical History  Diagnosis Date  . Spleen enlarged    Past Surgical History  Procedure Laterality Date  . Hernia repair     No family history on file. History  Substance Use Topics  . Smoking status: Current Every Day Smoker -- 0.50 packs/day    Types: Cigarettes  . Smokeless tobacco: Not on file  . Alcohol Use: No    Review of Systems  Constitutional: Positive for fever and chills.  HENT:  Positive for congestion, rhinorrhea and sore throat. Negative for ear pain and trouble swallowing.   Eyes: Negative for visual disturbance.  Respiratory: Positive for cough, shortness of breath and wheezing.   Gastrointestinal: Positive for nausea, vomiting and diarrhea. Abdominal pain: cramping with diarrhea.  Genitourinary: Negative for dysuria, frequency and decreased urine volume.  Musculoskeletal: Positive for myalgias.  Allergic/Immunologic: Negative for immunocompromised state.  Neurological: Positive for light-headedness. Negative for syncope.  Psychiatric/Behavioral: Negative for confusion. The patient is not nervous/anxious.     Allergies  Review of patient's allergies indicates no known allergies.  Home Medications   Current Outpatient Rx  Name  Route  Sig  Dispense  Refill  . oseltamivir (TAMIFLU) 30 MG capsule   Oral   Take 30 mg by mouth.         Marland Kitchen aspirin 325 MG tablet   Oral   Take 325 mg by mouth daily.         Marland Kitchen ibuprofen (ADVIL,MOTRIN) 200 MG tablet   Oral   Take 200 mg by mouth every 6 (six) hours as needed for pain (pain).          BP 116/80  Pulse 97  Temp(Src) 97.8 F (36.6 C) (Oral)  Resp 20  Ht 6\' 2"  (1.88 m)  Wt 215 lb (97.523 kg)  BMI 27.59 kg/m2  SpO2 98% Physical Exam  Nursing note and vitals reviewed. Constitutional: He is oriented to person, place, and time. He appears well-developed and well-nourished. No distress.  HENT:  Head: Normocephalic and atraumatic.  Eyes: EOM are normal. Right conjunctiva is injected. Left conjunctiva is injected.  Neck: Neck supple.  Cardiovascular: Normal rate and regular rhythm.   Pulmonary/Chest: Effort normal. No respiratory distress. He has wheezes. He has no rales.  Abdominal: Soft. Bowel sounds are normal. There is no tenderness.  Musculoskeletal: Normal range of motion.  Neurological: He is alert and oriented to person, place, and time. No cranial nerve deficit.  Skin: Skin is warm and dry.   Psychiatric: He has a normal mood and affect. His behavior is normal.    Results for orders placed during the hospital encounter of 12/03/13 (from the past 24 hour(s))  CBC WITH DIFFERENTIAL     Status: Abnormal   Collection Time    12/03/13  6:15 PM      Result Value Range   WBC 9.1  4.0 - 10.5 K/uL   RBC 5.29  4.22 - 5.81 MIL/uL   Hemoglobin 15.3  13.0 - 17.0 g/dL   HCT 43.7  39.0 - 52.0 %   MCV 82.6  78.0 - 100.0 fL   MCH 28.9  26.0 - 34.0 pg   MCHC 35.0  30.0 - 36.0 g/dL   RDW 14.9  11.5 - 15.5 %   Platelets 108 (*) 150 - 400 K/uL   Neutrophils Relative % 84 (*) 43 - 77 %   Neutro Abs 7.6  1.7 - 7.7 K/uL   Lymphocytes Relative 5 (*) 12 - 46 %   Lymphs Abs 0.5 (*) 0.7 - 4.0 K/uL   Monocytes Relative 11  3 - 12 %   Monocytes Absolute 1.0  0.1 - 1.0 K/uL   Eosinophils Relative 0  0 - 5 %   Eosinophils Absolute 0.0  0.0 - 0.7 K/uL   Basophils Relative 0  0 - 1 %   Basophils Absolute 0.0  0.0 - 0.1 K/uL  BASIC METABOLIC PANEL     Status: None   Collection Time    12/03/13  6:15 PM      Result Value Range   Sodium 138  137 - 147 mEq/L   Potassium 3.9  3.7 - 5.3 mEq/L   Chloride 96  96 - 112 mEq/L   CO2 26  19 - 32 mEq/L   Glucose, Bld 97  70 - 99 mg/dL   BUN 11  6 - 23 mg/dL   Creatinine, Ser 1.00  0.50 - 1.35 mg/dL   Calcium 9.5  8.4 - 10.5 mg/dL   GFR calc non Af Amer >90  >90 mL/min   GFR calc Af Amer >90  >90 mL/min    Dg Chest 2 View  12/03/2013   CLINICAL DATA:  Sore throat, cough, fever, nausea/vomiting/diarrhea, body aches  EXAM: CHEST  2 VIEW  COMPARISON:  CT chest dated 05/24/2013  FINDINGS: Lungs are clear. No pleural effusion or pneumothorax.  The heart is normal in size.  Mild degenerative changes of the visualized thoracolumbar spine.  IMPRESSION: No evidence of acute cardiopulmonary disease.   Electronically Signed   By: Julian Hy M.D.   On: 12/03/2013 19:15   20:30 pm ED Course: improved after albuterol/atrovent neb treatment, IV hydration, Zofran  and Phenergan. Taking PO fluids without nausea or vomiting.   Procedures Patient has Tamiflu and cough medication at home from his Urgent Care visit yesterday but has not been able to keep anything down. Will treat with Phenergan for his nausea and he will stay on clear liquids tonight and advance to B.R.A.T. Diet.  MDM   SUBJECTIVE:  Alan Sanders is a 43 y.o. male who present complaining of flu-like symptoms: fevers, chills, myalgias, congestion, sore throat and cough for one days.  OBJECTIVE: Appears moderately ill but not toxic; temperature as noted in vitals. Ears normal. Throat and pharynx with mild erythema.  Neck supple. No adenopathy in the neck. Sinuses non tender. Chest with wheezing initially but clear after albuterol/atrovent treatment.   ASSESSMENT: 1) Influenza 2) Nausea, vomiting and diarrhea  PLAN: Symptomatic therapy suggested: rest, increase fluids, use mist of vaporizer prn and call prn if symptoms persist or worsen. Call or return to clinic prn if these symptoms worsen or fail to improve as anticipated.  Discussed with the patient and all questioned fully answered. He will return if any problems arise.    Medication List    TAKE these medications       promethazine 25 MG tablet  Commonly known as:  PHENERGAN  Take 1 tablet (25 mg total) by mouth every 6 (six) hours as needed for nausea or vomiting.      ASK your doctor about these medications       aspirin 325 MG tablet  Take 325 mg by mouth daily.     ibuprofen 200 MG tablet  Commonly known as:  ADVIL,MOTRIN  Take 200 mg by mouth every 6 (six) hours as needed for pain (pain).     oseltamivir 30 MG capsule  Commonly known as:  TAMIFLU  Take 30 mg by mouth.         [place], [place] 12/04/13 [phone]

## 2013-12-03 NOTE — ED Notes (Signed)
Pt reports fever 102, dry cough, body aches, and many episodes n/v since friday am. Was seen at Urgent Care yesterday afternoon and had negative flu test. Started on tamiflu Friday but only took first dose because he has been vomiting. Was given IM phenergan at Urgent care and "pills for vomiting" but has been unable to keep those down either

## 2013-12-04 NOTE — ED Provider Notes (Signed)
Medical screening examination/treatment/procedure(s) were performed by non-physician practitioner and as supervising physician I was immediately available for consultation/collaboration.  EKG Interpretation   None        Merryl Hacker, MD 12/04/13 [phone]

## 2014-02-16 ENCOUNTER — Encounter (HOSPITAL_BASED_OUTPATIENT_CLINIC_OR_DEPARTMENT_OTHER): Payer: Self-pay | Admitting: Emergency Medicine

## 2014-02-16 ENCOUNTER — Emergency Department (HOSPITAL_BASED_OUTPATIENT_CLINIC_OR_DEPARTMENT_OTHER)
Admission: EM | Admit: 2014-02-16 | Discharge: 2014-02-16 | Disposition: A | Discharge status: Home / Self Care | Payer: BC Managed Care – PPO | Attending: Emergency Medicine | Admitting: Emergency Medicine

## 2014-02-16 DIAGNOSIS — M549 Dorsalgia, unspecified: Secondary | ICD-10-CM

## 2014-02-16 DIAGNOSIS — Z7982 Long term (current) use of aspirin: Secondary | ICD-10-CM | POA: Insufficient documentation

## 2014-02-16 DIAGNOSIS — M545 Low back pain, unspecified: Secondary | ICD-10-CM | POA: Insufficient documentation

## 2014-02-16 DIAGNOSIS — Z8719 Personal history of other diseases of the digestive system: Secondary | ICD-10-CM | POA: Insufficient documentation

## 2014-02-16 DIAGNOSIS — Z79899 Other long term (current) drug therapy: Secondary | ICD-10-CM | POA: Insufficient documentation

## 2014-02-16 DIAGNOSIS — R209 Unspecified disturbances of skin sensation: Secondary | ICD-10-CM | POA: Insufficient documentation

## 2014-02-16 DIAGNOSIS — F17200 Nicotine dependence, unspecified, uncomplicated: Secondary | ICD-10-CM | POA: Insufficient documentation

## 2014-02-16 MED ORDER — HYDROCODONE-ACETAMINOPHEN 5-325 MG PO TABS: 1.0000 | ORAL_TABLET | Freq: Four times a day (QID) | ORAL | Status: DC | PRN

## 2014-02-16 MED ORDER — HYDROMORPHONE HCL PF 1 MG/ML IJ SOLN
1.0000 mg | Freq: Once | INTRAMUSCULAR | Status: AC
  Administered 2014-02-16: 1 mg via INTRAMUSCULAR
  Filled 2014-02-16: qty 1

## 2014-02-16 MED ORDER — CYCLOBENZAPRINE HCL 10 MG PO TABS: 10.0000 mg | ORAL_TABLET | Freq: Two times a day (BID) | ORAL | Status: DC | PRN

## 2014-02-16 MED ORDER — CYCLOBENZAPRINE HCL 10 MG PO TABS
10.0000 mg | ORAL_TABLET | Freq: Once | ORAL | Status: AC
  Administered 2014-02-16: 10 mg via ORAL
  Filled 2014-02-16: qty 1

## 2014-02-16 NOTE — ED Notes (Signed)
Patient is resting comfortably in bed talking on cell phone.

## 2014-02-16 NOTE — ED Notes (Signed)
Back pain radiating to right leg- onset yesterday

## 2014-02-16 NOTE — ED Provider Notes (Signed)
CSN: [id number]     Arrival date & time 02/16/14  0734 History   First MD Initiated Contact with Patient 02/16/14 [phone]     Chief Complaint  Patient presents with  . Back Pain     (Consider location/radiation/quality/duration/timing/severity/associated sxs/prior Treatment) Patient is a 43 y.o. male presenting with back pain. The history is provided by the patient.  Back Pain Location:  Lumbar spine Quality:  Aching Radiates to:  R thigh (up R side of back) Pain severity:  Moderate Pain is:  Same all the time Onset quality:  Gradual Duration:  1 day Timing:  Constant Progression:  Unchanged Chronicity:  New Context: not falling, not jumping from heights, not occupational injury, not physical stress, not recent illness and not recent injury   Relieved by:  Nothing Worsened by:  Nothing tried Associated symptoms: tingling (R anterior thigh)   Associated symptoms: no abdominal pain, no bladder incontinence, no bowel incontinence, no dysuria, no fever, no numbness, no paresthesias, no pelvic pain, no perianal numbness and no weakness     Past Medical History  Diagnosis Date  . Spleen enlarged    Past Surgical History  Procedure Laterality Date  . Hernia repair     No family history on file. History  Substance Use Topics  . Smoking status: Current Every Day Smoker -- 0.50 packs/day    Types: Cigarettes  . Smokeless tobacco: Not on file  . Alcohol Use: No    Review of Systems  Constitutional: Negative for fever and chills.  Respiratory: Negative for cough and shortness of breath.   Gastrointestinal: Negative for vomiting, abdominal pain and bowel incontinence.  Genitourinary: Negative for bladder incontinence, dysuria, difficulty urinating and pelvic pain.  Musculoskeletal: Positive for back pain.  Neurological: Positive for tingling (R anterior thigh). Negative for weakness, numbness and paresthesias.  All other systems reviewed and are negative.      Allergies   Review of patient's allergies indicates no known allergies.  Home Medications   Current Outpatient Rx  Name  Route  Sig  Dispense  Refill  . aspirin 325 MG tablet   Oral   Take 325 mg by mouth daily.         Marland Kitchen ibuprofen (ADVIL,MOTRIN) 200 MG tablet   Oral   Take 200 mg by mouth every 6 (six) hours as needed for pain (pain).         Marland Kitchen oseltamivir (TAMIFLU) 30 MG capsule   Oral   Take 30 mg by mouth.         . promethazine (PHENERGAN) 25 MG tablet   Oral   Take 1 tablet (25 mg total) by mouth every 6 (six) hours as needed for nausea or vomiting.   30 tablet   0    BP 130/82  Pulse 84  Temp(Src) 97.8 F (36.6 C) (Oral)  Resp 16  Ht 6\' 2"  (1.88 m)  Wt 212 lb (96.163 kg)  BMI 27.21 kg/m2  SpO2 99% Physical Exam  Constitutional: He is oriented to person, place, and time. He appears well-developed and well-nourished. No distress.  HENT:  Head: Normocephalic and atraumatic.  Mouth/Throat: No oropharyngeal exudate.  Eyes: EOM are normal. Pupils are equal, round, and reactive to light.  Neck: Normal range of motion. Neck supple.  Cardiovascular: Normal rate and regular rhythm.  Exam reveals no friction rub.   No murmur heard. Pulmonary/Chest: Effort normal and breath sounds normal. No respiratory distress. He has no wheezes. He has no rales.  Abdominal:  He exhibits no distension. There is no tenderness. There is no rebound.  Musculoskeletal: Normal range of motion. He exhibits no edema.       Cervical back: He exhibits no tenderness and no bony tenderness.       Thoracic back: He exhibits no tenderness and no bony tenderness.       Lumbar back: He exhibits tenderness (R lumbar muscles). He exhibits no bony tenderness.  Neurological: He is alert and oriented to person, place, and time.  Skin: He is not diaphoretic.    ED Course  Procedures (including critical care time) Labs Review Labs Reviewed - No data to display Imaging Review No results found.   EKG  Interpretation None      MDM   Final diagnoses:  Back pain    15M here with back pain. Hx of back arthritis, pain now is different. Began yesterday, radiates up R side of back, R anterior thigh tingling. No weakness or numbness. Denies urinary/bowel incontinence/retention. No saddle anesthesia. No N/V/D, no fevers. Here with R lower back spasm and pain on palpation. Normal LE exam. Likely musculoskeletal back pain, will give pain meds, muscle relaxers. Pain mildly improved on recheck, 2nd dose of dilaudid given. On re-exam, states some improvement, but still having pain. Given Rx for pain meds, muscle relaxers, instructed to f/u with PCP.  Osvaldo Shipper, MD 02/16/14 [phone]

## 2014-02-16 NOTE — ED Notes (Signed)
Pt states he has called his wife who will come drive him home after he receives pain medication.

## 2014-02-16 NOTE — Discharge Instructions (Signed)
Back Exercises °Back exercises help treat and prevent back injuries. The goal of back exercises is to increase the strength of your abdominal and back muscles and the flexibility of your back. These exercises should be started when you no longer have back pain. Back exercises include: °· Pelvic Tilt. Lie on your back with your knees bent. Tilt your pelvis until the lower part of your back is against the floor. Hold this position 5 to 10 sec and repeat 5 to 10 times. °· Knee to Chest. Pull first 1 knee up against your chest and hold for 20 to 30 seconds, repeat this with the other knee, and then both knees. This may be done with the other leg straight or bent, whichever feels better. °· Sit-Ups or Curl-Ups. Bend your knees 90 degrees. Start with tilting your pelvis, and do a partial, slow sit-up, lifting your trunk only 30 to 45 degrees off the floor. Take at least 2 to 3 seconds for each sit-up. Do not do sit-ups with your knees out straight. If partial sit-ups are difficult, simply do the above but with only tightening your abdominal muscles and holding it as directed. °· Hip-Lift. Lie on your back with your knees flexed 90 degrees. Push down with your feet and shoulders as you raise your hips a couple inches off the floor; hold for 10 seconds, repeat 5 to 10 times. °· Back arches. Lie on your stomach, propping yourself up on bent elbows. Slowly press on your hands, causing an arch in your low back. Repeat 3 to 5 times. Any initial stiffness and discomfort should lessen with repetition over time. °· Shoulder-Lifts. Lie face down with arms beside your body. Keep hips and torso pressed to floor as you slowly lift your head and shoulders off the floor. °Do not overdo your exercises, especially in the beginning. Exercises may cause you some mild back discomfort which lasts for a few minutes; however, if the pain is more severe, or lasts for more than 15 minutes, do not continue exercises until you see your caregiver.  Improvement with exercise therapy for back problems is slow.  °See your caregivers for assistance with developing a proper back exercise program. °Document Released: 12/18/2004 Document Revised: 02/02/2012 Document Reviewed: 09/11/2011 °[employer]® Patient Information ©2014 [employer]. ° °Back Injury Prevention °Back injuries can be extremely painful and difficult to heal. After having one back injury, you are much more likely to experience another later on. It is important to learn how to avoid injuring or re-injuring your back. The following tips can help you to prevent a back injury. °PHYSICAL FITNESS °· Exercise regularly and try to develop good tone in your abdominal muscles. Your abdominal muscles provide a lot of the support needed by your back. °· Do aerobic exercises (walking, jogging, biking, swimming) regularly. °· Do exercises that increase balance and strength (tai chi, yoga) regularly. This can decrease your risk of falling and injuring your back. °· Stretch before and after exercising. °· Maintain a healthy weight. The more you weigh, the more stress is placed on your back. For every pound of weight, 10 times that amount of pressure is placed on the back. °DIET °· Talk to your caregiver about how much calcium and vitamin D you need per day. These nutrients help to prevent weakening of the bones (osteoporosis). Osteoporosis can cause broken (fractured) bones that lead to back pain. °· Include good sources of calcium in your diet, such as dairy products, green, leafy vegetables, and products with   calcium added (fortified).  Include good sources of vitamin D in your diet, such as milk and foods that are fortified with vitamin D.  Consider taking a nutritional supplement or a multivitamin if needed.  Stop smoking if you smoke. POSTURE  Sit and stand up straight. Avoid leaning forward when you sit or hunching over when you stand.  Choose chairs with good low back (lumbar) support.  If you  work at a desk, sit close to your work so you do not need to lean over. Keep your chin tucked in. Keep your neck drawn back and elbows bent at a right angle. Your arms should look like the letter "L."  Sit high and close to the steering wheel when you drive. Add a lumbar support to your car seat if needed.  Avoid sitting or standing in one position for too long. Take breaks to get up, stretch, and walk around at least once every hour. Take breaks if you are driving for long periods of time.  Sleep on your side with your knees slightly bent, or sleep on your back with a pillow under your knees. Do not sleep on your stomach. LIFTING, TWISTING, AND REACHING  Avoid heavy lifting, especially repetitive lifting. If you must do heavy lifting:  Stretch before lifting.  Work slowly.  Rest between lifts.  Use carts and dollies to move objects when possible.  Make several small trips instead of carrying 1 heavy load.  Ask for help when you need it.  Ask for help when moving big, awkward objects.  Follow these steps when lifting:  Stand with your feet shoulder-width apart.  Get as close to the object as you can. Do not try to pick up heavy objects that are far from your body.  Use handles or lifting straps if they are available.  Bend at your knees. Squat down, but keep your heels off the floor.  Keep your shoulders pulled back, your chin tucked in, and your back straight.  Lift the object slowly, tightening the muscles in your legs, abdomen, and buttocks. Keep the object as close to the center of your body as possible.  When you put a load down, use these same guidelines in reverse.  Do not:  Lift the object above your waist.  Twist at the waist while lifting or carrying a load. Move your feet if you need to turn, not your waist.  Bend over without bending at your knees.  Avoid reaching over your head, across a table, or for an object on a high surface. OTHER TIPS  Avoid wet  floors and keep sidewalks clear of ice to prevent falls.  Do not sleep on a mattress that is too soft or too hard.  Keep items that are used frequently within easy reach.  Put heavier objects on shelves at waist level and lighter objects on lower or higher shelves.  Find ways to decrease your stress, such as exercise, massage, or relaxation techniques. Stress can build up in your muscles. Tense muscles are more vulnerable to injury.  Seek treatment for depression or anxiety if needed. These conditions can increase your risk of developing back pain. SEEK MEDICAL CARE IF:  You injure your back.  You have questions about diet, exercise, or other ways to prevent back injuries. MAKE SURE YOU:  Understand these instructions.  Will watch your condition.  Will get help right away if you are not doing well or get worse. Document Released: 12/18/2004 Document Revised: 02/02/2012 Document Reviewed:  ExitCare Patient Information 2014 [employer]. Back Pain, Adult Low back pain is very common. About 1 in 5 people have back pain.The cause of low back pain is rarely dangerous. The pain often gets better over time.About half of people with a sudden onset of back pain feel better in just 2 weeks. About 8 in 10 people feel better by 6 weeks.  CAUSES Some common causes of back pain include:  Strain of the muscles or ligaments supporting the spine.  Wear and tear (degeneration) of the spinal discs.  Arthritis.  Direct injury to the back. DIAGNOSIS Most of the time, the direct cause of low back pain is not known.However, back pain can be treated effectively even when the exact cause of the pain is unknown.Answering your caregiver's questions about your overall health and symptoms is one of the most accurate ways to make sure the cause of your pain is not dangerous. If your caregiver needs more information, he or she may order lab work or imaging tests (X-rays or MRIs).However,  even if imaging tests show changes in your back, this usually does not require surgery. HOME CARE INSTRUCTIONS For many people, back pain returns.Since low back pain is rarely dangerous, it is often a condition that people can learn to manageon their own.   Remain active. It is stressful on the back to sit or stand in one place. Do not sit, drive, or stand in one place for more than 30 minutes at a time. Take short walks on level surfaces as soon as pain allows.Try to increase the length of time you walk each day.  Do not stay in bed.Resting more than 1 or 2 days can delay your recovery.  Do not avoid exercise or work.Your body is made to move.It is not dangerous to be active, even though your back may hurt.Your back will likely heal faster if you return to being active before your pain is gone.  Pay attention to your body when you bend and lift. Many people have less discomfortwhen lifting if they bend their knees, keep the load close to their bodies,and avoid twisting. Often, the most comfortable positions are those that put less stress on your recovering back.  Find a comfortable position to sleep. Use a firm mattress and lie on your side with your knees slightly bent. If you lie on your back, put a pillow under your knees.  Only take over-the-counter or prescription medicines as directed by your caregiver. Over-the-counter medicines to reduce pain and inflammation are often the most helpful.Your caregiver may prescribe muscle relaxant drugs.These medicines help dull your pain so you can more quickly return to your normal activities and healthy exercise.  Put ice on the injured area.  Put ice in a plastic bag.  Place a towel between your skin and the bag.  Leave the ice on for 15-20 minutes, 03-04 times a day for the first 2 to 3 days. After that, ice and heat may be alternated to reduce pain and spasms.  Ask your caregiver about trying back exercises and gentle massage. This  may be of some benefit.  Avoid feeling anxious or stressed.Stress increases muscle tension and can worsen back pain.It is important to recognize when you are anxious or stressed and learn ways to manage it.Exercise is a great option. SEEK MEDICAL CARE IF:  You have pain that is not relieved with rest or medicine.  You have pain that does not improve in 1 week.  You have new symptoms.    You are generally not feeling well. SEEK IMMEDIATE MEDICAL CARE IF:   You have pain that radiates from your back into your legs.  You develop new bowel or bladder control problems.  You have unusual weakness or numbness in your arms or legs.  You develop nausea or vomiting.  You develop abdominal pain.  You feel faint. Document Released: 11/10/2005 Document Revised: 05/11/2012 Document Reviewed: 03/31/2011 Exit[employer] Patient Information 2014 [employer].  

## 2014-02-16 NOTE — ED Notes (Signed)
Denies numbness tingling in extremities or incontinence.

## 2014-02-16 NOTE — ED Notes (Signed)
Patient sitting up on bedside resting comfortably.

## 2014-05-31 ENCOUNTER — Emergency Department (HOSPITAL_BASED_OUTPATIENT_CLINIC_OR_DEPARTMENT_OTHER)
Admission: EM | Admit: 2014-05-31 | Discharge: 2014-05-31 | Disposition: A | Discharge status: Home / Self Care | Payer: BC Managed Care – PPO | Attending: Emergency Medicine | Admitting: Emergency Medicine

## 2014-05-31 ENCOUNTER — Emergency Department (HOSPITAL_BASED_OUTPATIENT_CLINIC_OR_DEPARTMENT_OTHER): Payer: BC Managed Care – PPO

## 2014-05-31 ENCOUNTER — Encounter (HOSPITAL_BASED_OUTPATIENT_CLINIC_OR_DEPARTMENT_OTHER): Payer: Self-pay | Admitting: Emergency Medicine

## 2014-05-31 DIAGNOSIS — R945 Abnormal results of liver function studies: Secondary | ICD-10-CM | POA: Insufficient documentation

## 2014-05-31 DIAGNOSIS — F17200 Nicotine dependence, unspecified, uncomplicated: Secondary | ICD-10-CM | POA: Insufficient documentation

## 2014-05-31 DIAGNOSIS — Z792 Long term (current) use of antibiotics: Secondary | ICD-10-CM | POA: Insufficient documentation

## 2014-05-31 DIAGNOSIS — J45909 Unspecified asthma, uncomplicated: Secondary | ICD-10-CM | POA: Insufficient documentation

## 2014-05-31 DIAGNOSIS — R1032 Left lower quadrant pain: Secondary | ICD-10-CM | POA: Insufficient documentation

## 2014-05-31 DIAGNOSIS — R1033 Periumbilical pain: Secondary | ICD-10-CM | POA: Insufficient documentation

## 2014-05-31 DIAGNOSIS — R1031 Right lower quadrant pain: Secondary | ICD-10-CM | POA: Insufficient documentation

## 2014-05-31 DIAGNOSIS — R109 Unspecified abdominal pain: Secondary | ICD-10-CM

## 2014-05-31 DIAGNOSIS — R7989 Other specified abnormal findings of blood chemistry: Secondary | ICD-10-CM

## 2014-05-31 DIAGNOSIS — Z79899 Other long term (current) drug therapy: Secondary | ICD-10-CM | POA: Insufficient documentation

## 2014-05-31 HISTORY — DX: Unspecified asthma, uncomplicated: J45.909

## 2014-05-31 LAB — URINALYSIS, ROUTINE W REFLEX MICROSCOPIC
Glucose, UA: NEGATIVE mg/dL
Hgb urine dipstick: NEGATIVE
KETONES UR: NEGATIVE mg/dL
LEUKOCYTES UA: NEGATIVE
NITRITE: NEGATIVE
PROTEIN: NEGATIVE mg/dL
Specific Gravity, Urine: 1.036 — ABNORMAL HIGH (ref 1.005–1.030)
Urobilinogen, UA: 0.2 mg/dL (ref 0.0–1.0)
pH: 5 (ref 5.0–8.0)

## 2014-05-31 LAB — BASIC METABOLIC PANEL
ANION GAP: 15 (ref 5–15)
BUN: 8 mg/dL (ref 6–23)
CHLORIDE: 103 meq/L (ref 96–112)
CO2: 24 mEq/L (ref 19–32)
CREATININE: 1 mg/dL (ref 0.50–1.35)
Calcium: 9.9 mg/dL (ref 8.4–10.5)
GFR calc non Af Amer: >90 mL/min (ref >90)
Glucose, Bld: 111 mg/dL — ABNORMAL HIGH (ref 70–99)
POTASSIUM: 3.8 meq/L (ref 3.7–5.3)
SODIUM: 142 meq/L (ref 137–147)

## 2014-05-31 LAB — HEPATIC FUNCTION PANEL
ALT: 123 U/L — ABNORMAL HIGH (ref 0–53)
AST: 64 U/L — AB (ref 0–37)
Albumin: 4.6 g/dL (ref 3.5–5.2)
Alkaline Phosphatase: 76 U/L (ref 39–117)
Bilirubin, Direct: <0.2 mg/dL (ref 0.0–0.3)
Total Bilirubin: 0.5 mg/dL (ref 0.3–1.2)
Total Protein: 7.5 g/dL (ref 6.0–8.3)

## 2014-05-31 LAB — CBC WITH DIFFERENTIAL/PLATELET
BASOS ABS: 0 10*3/uL (ref 0.0–0.1)
BASOS PCT: 0 % (ref 0–1)
Eosinophils Absolute: 0.2 10*3/uL (ref 0.0–0.7)
Eosinophils Relative: 2 % (ref 0–5)
HCT: 45.8 % (ref 39.0–52.0)
Hemoglobin: 16.6 g/dL (ref 13.0–17.0)
Lymphocytes Relative: 18 % (ref 12–46)
Lymphs Abs: 1.4 10*3/uL (ref 0.7–4.0)
MCH: 29.5 pg (ref 26.0–34.0)
MCHC: 36.2 g/dL — AB (ref 30.0–36.0)
MCV: 81.3 fL (ref 78.0–100.0)
MONO ABS: 0.4 10*3/uL (ref 0.1–1.0)
Monocytes Relative: 5 % (ref 3–12)
NEUTROS ABS: 5.7 10*3/uL (ref 1.7–7.7)
NEUTROS PCT: 74 % (ref 43–77)
PLATELETS: 123 10*3/uL — AB (ref 150–400)
RBC: 5.63 MIL/uL (ref 4.22–5.81)
RDW: 15.1 % (ref 11.5–15.5)
WBC: 7.7 10*3/uL (ref 4.0–10.5)

## 2014-05-31 LAB — LIPASE, BLOOD: LIPASE: 36 U/L (ref 11–59)

## 2014-05-31 IMAGING — CT CT ABD-PELV W/ CM
2 of 5 series · 15 of 46 positions shown, 17 images · IV contrast (omnipaque)
Comparison: [DATE]

CLINICAL DATA: Lower abdominal pain and nausea.

EXAM:
CT ABDOMEN AND PELVIS WITH CONTRAST
TECHNIQUE: Multidetector CT imaging of the abdomen and pelvis was performed
using the standard protocol following bolus administration of
intravenous contrast.
CONTRAST:  50mL OMNIPAQUE IOHEXOL 300 MG/ML SOLN, 100mL OMNIPAQUE
IOHEXOL 300 MG/ML SOLN

[Series 3: abd/pelvis 5.0 b31f · axial · 0.78mm/px · z∈[-466,+14]mm · 12 of 110 slices shown, 14 images]
[im 7/110  soft-tissue]
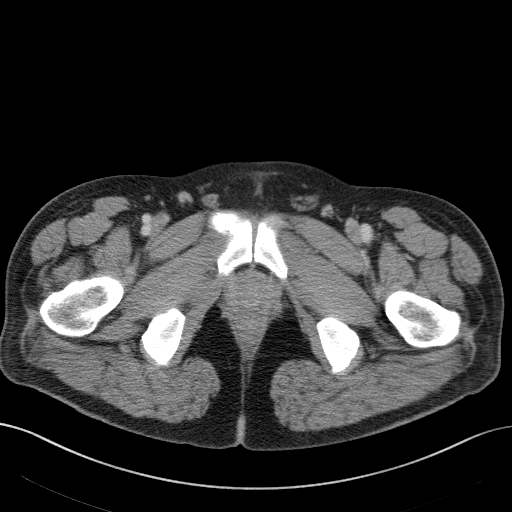
[im 7/110  bone]
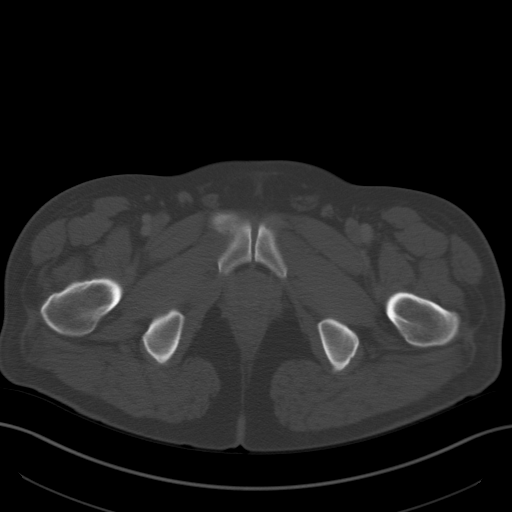
[im 19/110  soft-tissue]
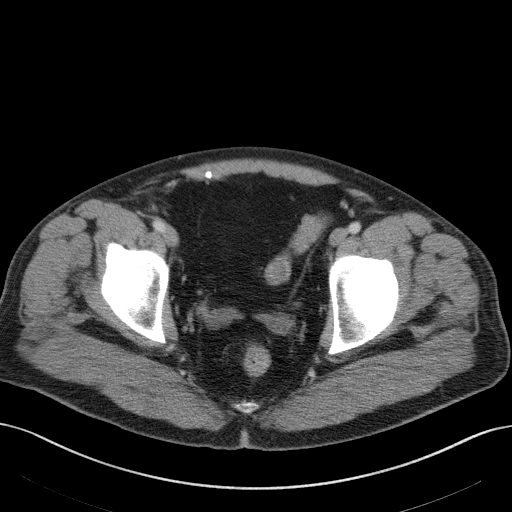
[im 25/110  soft-tissue]
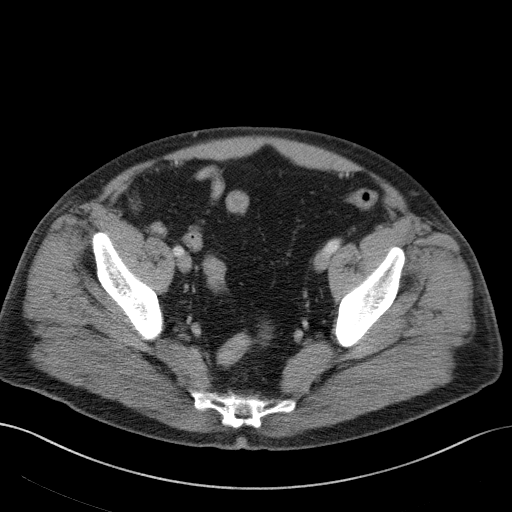
[im 31/110  soft-tissue]
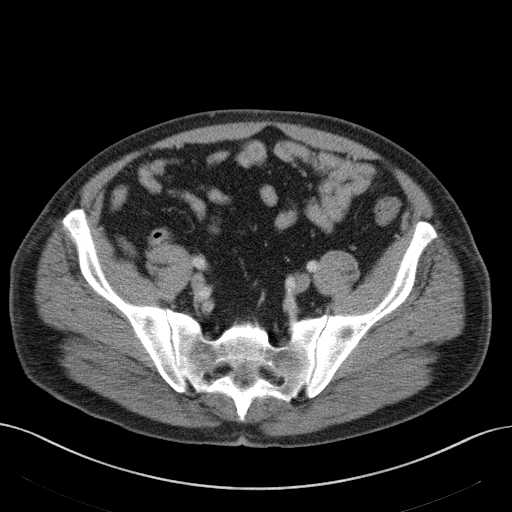
[im 43/110  soft-tissue]
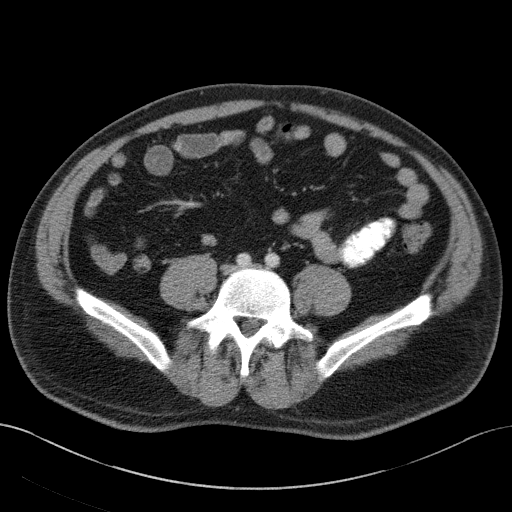
[im 49/110  soft-tissue]
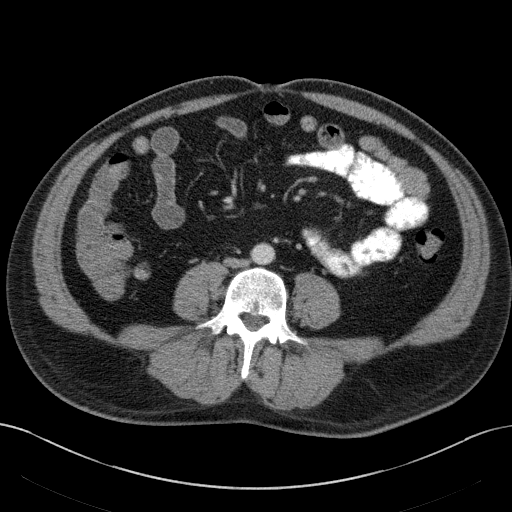
[im 61/110  soft-tissue]
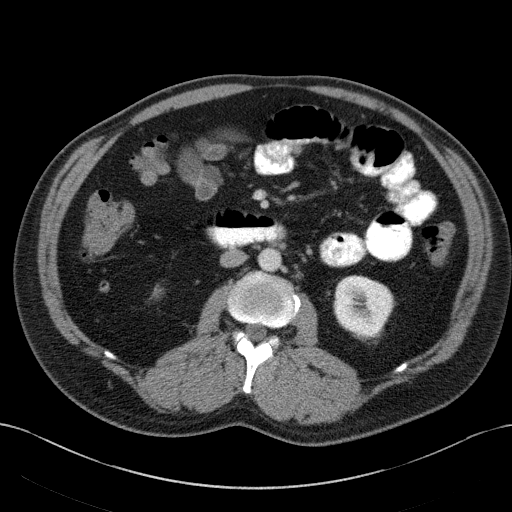
[im 67/110  soft-tissue]
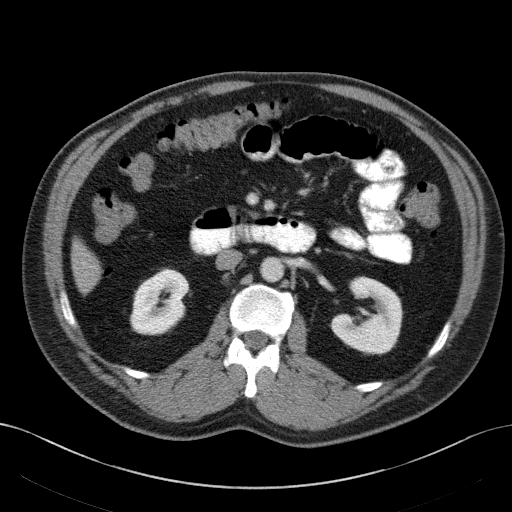
[im 79/110  soft-tissue]
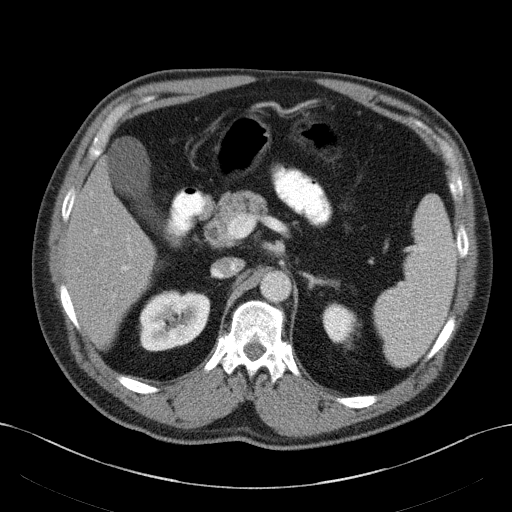
[im 79/110  bone]
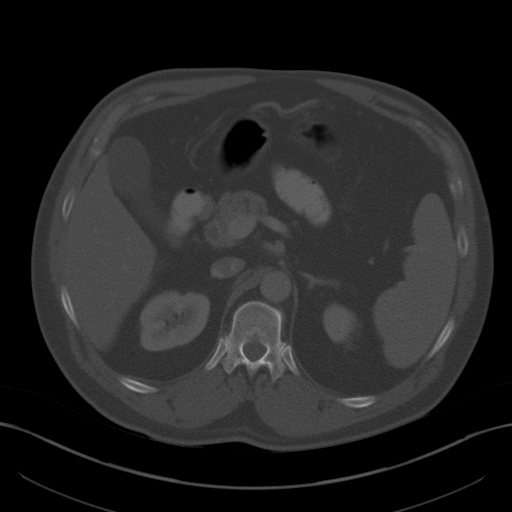
[im 85/110  soft-tissue]
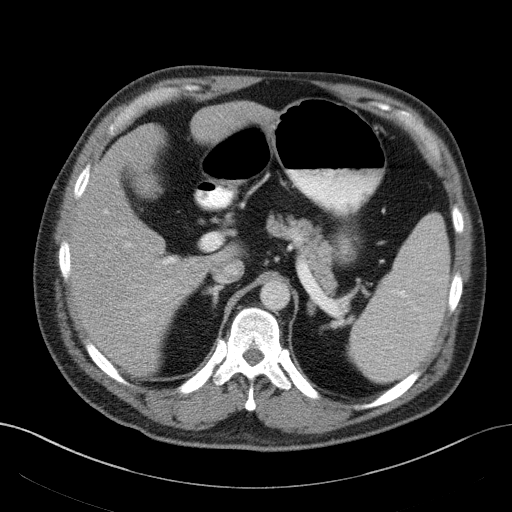
[im 91/110  soft-tissue]
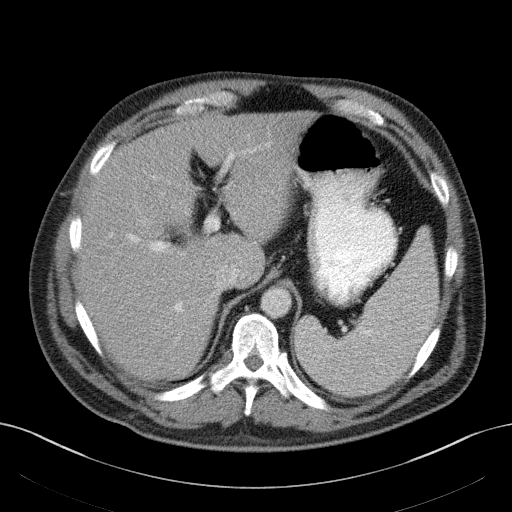
[im 103/110  soft-tissue]
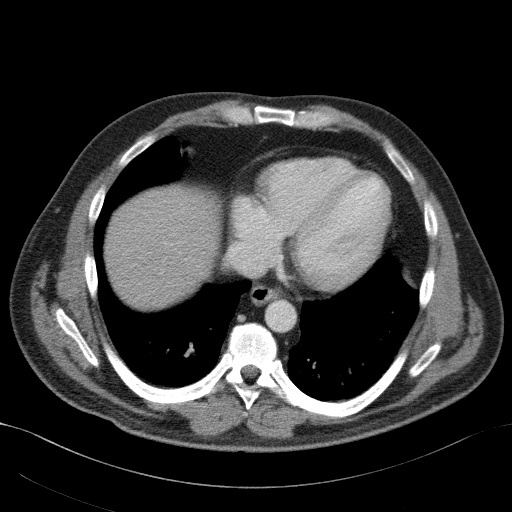

[Series 6: abd/pelvis 3.0 coronal · coronal · 0.74mm/px · 3 of 87 slices shown]
[im 29/87  soft-tissue]
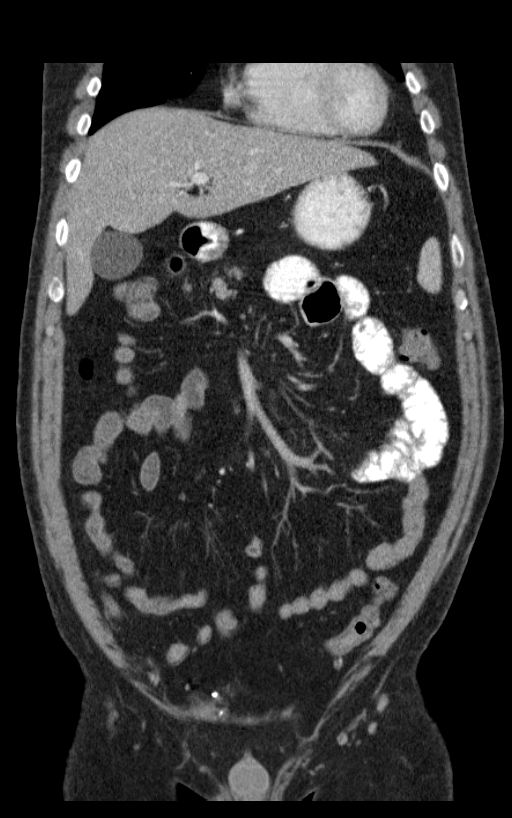
[im 39/87  soft-tissue]
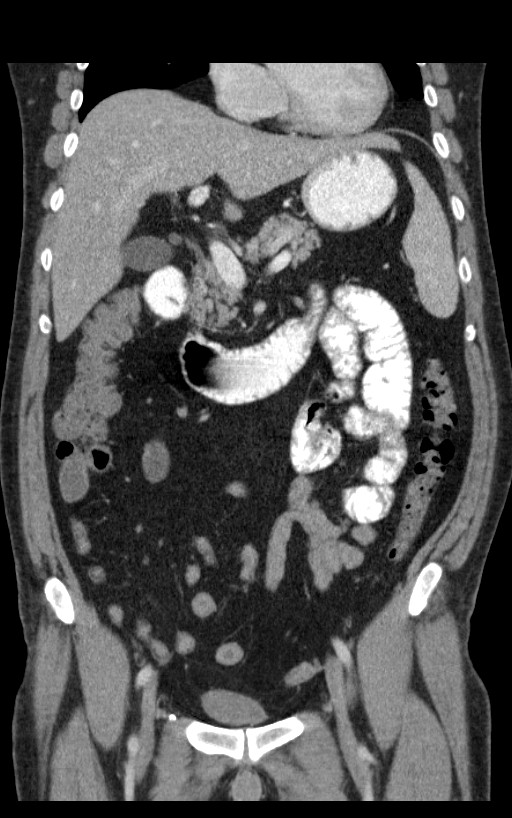
[im 48/87  soft-tissue]
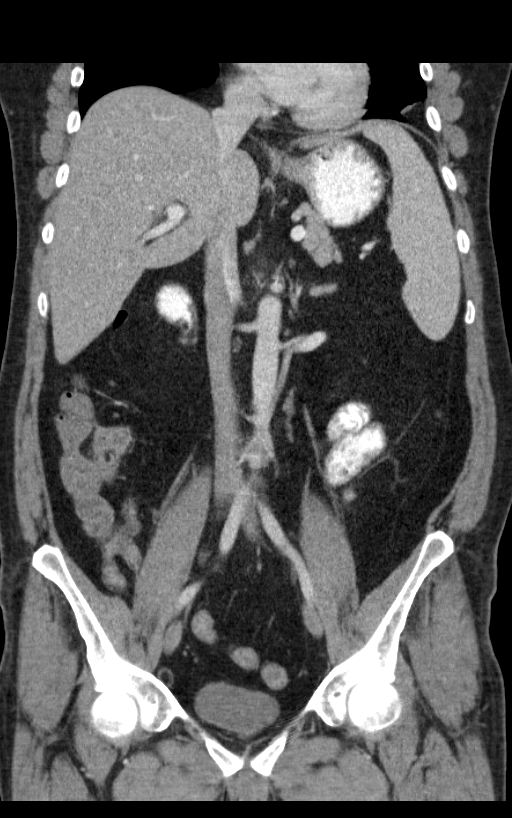

[15 of 46 positions shown; findings below may reference images not displayed]

FINDINGS: Atelectasis in the lung bases.

Mild enlargement of the spleen. No focal lesions. Diffuse fatty
infiltration of the liver. No focal lesions. The gallbladder,
pancreas, adrenal glands, kidneys, abdominal aorta, inferior vena
cava, and retroperitoneal lymph nodes are unremarkable. Stomach,
small bowel, and colon are not abnormally distended. No free air or
free fluid in the abdomen.

Pelvis: Appendix is normal. Prostate gland is not enlarged. Prostate
calcification is present. Bladder wall is not thickened. No changes
to suggest diverticulitis. No free or loculated pelvic fluid
collections. Postoperative changes consistent with right inguinal
hernia repair. No destructive bone lesions. The
IMPRESSION: No acute process demonstrated in the abdomen or pelvis. Mild fatty
infiltration of the liver and mild enlargement of the spleen.

## 2014-05-31 MED ORDER — HYDROMORPHONE HCL PF 1 MG/ML IJ SOLN
1.0000 mg | Freq: Once | INTRAMUSCULAR | Status: AC
  Administered 2014-05-31: 1 mg via INTRAVENOUS
  Filled 2014-05-31: qty 1

## 2014-05-31 MED ORDER — IOHEXOL 300 MG/ML  SOLN
100.0000 mL | Freq: Once | INTRAMUSCULAR | Status: AC | PRN
  Administered 2014-05-31: 100 mL via INTRAVENOUS

## 2014-05-31 MED ORDER — ONDANSETRON HCL 4 MG/2ML IJ SOLN
4.0000 mg | Freq: Once | INTRAMUSCULAR | Status: AC
  Administered 2014-05-31: 4 mg via INTRAVENOUS
  Filled 2014-05-31: qty 2

## 2014-05-31 MED ORDER — MORPHINE SULFATE 4 MG/ML IJ SOLN
4.0000 mg | Freq: Once | INTRAMUSCULAR | Status: AC
  Administered 2014-05-31: 4 mg via INTRAVENOUS
  Filled 2014-05-31: qty 1

## 2014-05-31 MED ORDER — IOHEXOL 300 MG/ML  SOLN
50.0000 mL | Freq: Once | INTRAMUSCULAR | Status: AC | PRN
  Administered 2014-05-31: 50 mL via ORAL

## 2014-05-31 NOTE — ED Provider Notes (Signed)
CSN: [id number]     Arrival date & time 05/31/14  1912 History   First MD Initiated Contact with Patient 05/31/14 1950     Chief Complaint  Patient presents with  . Abdominal Pain     (Consider location/radiation/quality/duration/timing/severity/associated sxs/prior Treatment) HPI Comments: Pt states that he has been having lower abdominal pain and some back pain for the last 2 weeks. He was seen at [facility] and told that it was a virus. Pt states that he continued to have the symptoms so 5 days ago he was seen at an urgent care and no studies were done but he was started on cipro flagyl for infection . Denies fever. Intermittent vomiting and diarrhea. States that he noticed blood in his urine today. Symptoms are worsening  The history is provided by the patient. No language interpreter was used.    Past Medical History  Diagnosis Date  . Spleen enlarged    Past Surgical History  Procedure Laterality Date  . Hernia repair     No family history on file. History  Substance Use Topics  . Smoking status: Current Every Day Smoker -- 0.50 packs/day    Types: Cigarettes  . Smokeless tobacco: Not on file  . Alcohol Use: No    Review of Systems  Constitutional: Negative.   HENT: Negative.   Respiratory: Negative.   Cardiovascular: Negative.       Allergies  Review of patient's allergies indicates no known allergies.  Home Medications   Prior to Admission medications   Medication Sig Start Date End Date Taking? Authorizing Provider  ciprofloxacin (CIPRO) 500 MG tablet Take 500 mg by mouth 2 (two) times daily.   Yes Historical Provider, MD  metroNIDAZOLE (FLAGYL) 500 MG tablet Take 500 mg by mouth 3 (three) times daily.   Yes Historical Provider, MD  UNKNOWN TO PATIENT "muscle relaxer"   Yes Historical Provider, MD  aspirin 325 MG tablet Take 325 mg by mouth daily.    Historical Provider, MD  cyclobenzaprine (FLEXERIL) 10 MG tablet Take 1 tablet (10 mg total) by mouth  2 (two) times daily as needed for muscle spasms. 02/16/14   Osvaldo Shipper, MD  HYDROcodone-acetaminophen (NORCO/VICODIN) 5-325 MG per tablet Take 1 tablet by mouth every 6 (six) hours as needed for moderate pain. 02/16/14   Osvaldo Shipper, MD  ibuprofen (ADVIL,MOTRIN) 200 MG tablet Take 200 mg by mouth every 6 (six) hours as needed for pain (pain).    Historical Provider, MD  oseltamivir (TAMIFLU) 30 MG capsule Take 30 mg by mouth.    Historical Provider, MD  promethazine (PHENERGAN) 25 MG tablet Take 1 tablet (25 mg total) by mouth every 6 (six) hours as needed for nausea or vomiting. 12/03/13   Hope Bunnie Pion, NP   BP 133/88  Pulse 85  Temp(Src) 98.1 F (36.7 C) (Oral)  Resp 20  Ht 6\' 2"  (1.88 m)  Wt 215 lb (97.523 kg)  BMI 27.59 kg/m2  SpO2 98% Physical Exam  Nursing note and vitals reviewed. Constitutional: He is oriented to person, place, and time. He appears well-developed and well-nourished.  HENT:  Head: Normocephalic and atraumatic.  Cardiovascular: Normal rate and regular rhythm.   Pulmonary/Chest: Effort normal and breath sounds normal.  Abdominal: Soft. There is tenderness in the right lower quadrant, periumbilical area, suprapubic area and left lower quadrant.  Musculoskeletal: Normal range of motion.  Neurological: He is alert and oriented to person, place, and time.  Skin: Skin is warm and  dry.  Psychiatric: He has a normal mood and affect.    ED Course  Procedures (including critical care time) Labs Review Labs Reviewed  URINALYSIS, ROUTINE W REFLEX MICROSCOPIC - Abnormal; Notable for the following:    Color, Urine AMBER (*)    APPearance CLOUDY (*)    Specific Gravity, Urine 1.036 (*)    Bilirubin Urine SMALL (*)    All other components within normal limits  CBC WITH DIFFERENTIAL - Abnormal; Notable for the following:    MCHC 36.2 (*)    Platelets 123 (*)    All other components within normal limits  BASIC METABOLIC PANEL - Abnormal; Notable for the  following:    Glucose, Bld 111 (*)    All other components within normal limits  HEPATIC FUNCTION PANEL - Abnormal; Notable for the following:    AST 64 (*)    ALT 123 (*)    All other components within normal limits  LIPASE, BLOOD    Imaging Review Ct Abdomen Pelvis W Contrast  05/31/2014   CLINICAL DATA:  Lower abdominal pain and nausea.  EXAM: CT ABDOMEN AND PELVIS WITH CONTRAST  TECHNIQUE: Multidetector CT imaging of the abdomen and pelvis was performed using the standard protocol following bolus administration of intravenous contrast.  CONTRAST:  30mL OMNIPAQUE IOHEXOL 300 MG/ML SOLN, 177mL OMNIPAQUE IOHEXOL 300 MG/ML SOLN  COMPARISON:  05/24/2014  FINDINGS: Atelectasis in the lung bases.  Mild enlargement of the spleen. No focal lesions. Diffuse fatty infiltration of the liver. No focal lesions. The gallbladder, pancreas, adrenal glands, kidneys, abdominal aorta, inferior vena cava, and retroperitoneal lymph nodes are unremarkable. Stomach, small bowel, and colon are not abnormally distended. No free air or free fluid in the abdomen.  Pelvis: Appendix is normal. Prostate gland is not enlarged. Prostate calcification is present. Bladder wall is not thickened. No changes to suggest diverticulitis. No free or loculated pelvic fluid collections. Postoperative changes consistent with right inguinal hernia repair. No destructive bone lesions. The  IMPRESSION: No acute process demonstrated in the abdomen or pelvis. Mild fatty infiltration of the liver and mild enlargement of the spleen.   Electronically Signed   By: Lucienne Capers M.D.   On: 05/31/2014 21:49     EKG Interpretation None      MDM   Final diagnoses:  Abdominal pain, unspecified abdominal location  Elevated LFTs    No acute abdominal process noted on ct. Will refer to gi. Pt is tolerating po here.discussed findings with pt and wife    Glendell Docker, NP 05/31/14 2209

## 2014-05-31 NOTE — ED Provider Notes (Signed)
Medical screening examination/treatment/procedure(s) were performed by non-physician practitioner and as supervising physician I was immediately available for consultation/collaboration.   EKG Interpretation None        Houston Siren III, MD 05/31/14 [id number]

## 2014-05-31 NOTE — Discharge Instructions (Signed)

## 2014-05-31 NOTE — ED Notes (Signed)
C/o lower abd and back pain x 2 weeks-was seen at [facility] ED with dx virus-seen at urgent care dx with "infection" started on cipro and flagyl-states increase in pain and blood in urine

## 2014-05-31 NOTE — ED Notes (Signed)
Patient transported to CT 

## 2014-05-31 NOTE — ED Notes (Signed)
C/o lower abd and back pain x 2 weeks  Increased pain w urination, blood in urine

## 2014-10-07 ENCOUNTER — Emergency Department (HOSPITAL_BASED_OUTPATIENT_CLINIC_OR_DEPARTMENT_OTHER): Payer: BC Managed Care – PPO

## 2014-10-07 ENCOUNTER — Encounter (HOSPITAL_BASED_OUTPATIENT_CLINIC_OR_DEPARTMENT_OTHER): Payer: Self-pay

## 2014-10-07 ENCOUNTER — Emergency Department (HOSPITAL_BASED_OUTPATIENT_CLINIC_OR_DEPARTMENT_OTHER)
Admission: EM | Admit: 2014-10-07 | Discharge: 2014-10-07 | Disposition: A | Discharge status: Home / Self Care | Payer: BC Managed Care – PPO | Attending: Emergency Medicine | Admitting: Emergency Medicine

## 2014-10-07 DIAGNOSIS — H109 Unspecified conjunctivitis: Secondary | ICD-10-CM | POA: Insufficient documentation

## 2014-10-07 DIAGNOSIS — J45909 Unspecified asthma, uncomplicated: Secondary | ICD-10-CM | POA: Insufficient documentation

## 2014-10-07 DIAGNOSIS — R110 Nausea: Secondary | ICD-10-CM | POA: Insufficient documentation

## 2014-10-07 DIAGNOSIS — R059 Cough, unspecified: Secondary | ICD-10-CM

## 2014-10-07 DIAGNOSIS — R109 Unspecified abdominal pain: Secondary | ICD-10-CM

## 2014-10-07 DIAGNOSIS — R05 Cough: Secondary | ICD-10-CM

## 2014-10-07 DIAGNOSIS — Z79899 Other long term (current) drug therapy: Secondary | ICD-10-CM | POA: Insufficient documentation

## 2014-10-07 DIAGNOSIS — R531 Weakness: Secondary | ICD-10-CM | POA: Insufficient documentation

## 2014-10-07 DIAGNOSIS — R1032 Left lower quadrant pain: Secondary | ICD-10-CM | POA: Insufficient documentation

## 2014-10-07 DIAGNOSIS — R197 Diarrhea, unspecified: Secondary | ICD-10-CM | POA: Insufficient documentation

## 2014-10-07 DIAGNOSIS — Z720 Tobacco use: Secondary | ICD-10-CM | POA: Insufficient documentation

## 2014-10-07 DIAGNOSIS — B349 Viral infection, unspecified: Secondary | ICD-10-CM | POA: Insufficient documentation

## 2014-10-07 LAB — HEPATIC FUNCTION PANEL
ALT: 64 U/L — AB (ref 0–53)
AST: 40 U/L — ABNORMAL HIGH (ref 0–37)
Albumin: 4.4 g/dL (ref 3.5–5.2)
Alkaline Phosphatase: 100 U/L (ref 39–117)
BILIRUBIN DIRECT: 0.2 mg/dL (ref 0.0–0.3)
BILIRUBIN TOTAL: 1.1 mg/dL (ref 0.3–1.2)
Indirect Bilirubin: 0.9 mg/dL (ref 0.3–0.9)
Total Protein: 8.2 g/dL (ref 6.0–8.3)

## 2014-10-07 LAB — CBC WITH DIFFERENTIAL/PLATELET
BASOS PCT: 0 % (ref 0–1)
Basophils Absolute: 0 10*3/uL (ref 0.0–0.1)
EOS PCT: 2 % (ref 0–5)
Eosinophils Absolute: 0.3 10*3/uL (ref 0.0–0.7)
HEMATOCRIT: 45 % (ref 39.0–52.0)
HEMOGLOBIN: 15.9 g/dL (ref 13.0–17.0)
LYMPHS PCT: 11 % — AB (ref 12–46)
Lymphs Abs: 1.2 10*3/uL (ref 0.7–4.0)
MCH: 29.1 pg (ref 26.0–34.0)
MCHC: 35.3 g/dL (ref 30.0–36.0)
MCV: 82.3 fL (ref 78.0–100.0)
MONO ABS: 0.9 10*3/uL (ref 0.1–1.0)
MONOS PCT: 9 % (ref 3–12)
NEUTROS ABS: 7.9 10*3/uL — AB (ref 1.7–7.7)
Neutrophils Relative %: 78 % — ABNORMAL HIGH (ref 43–77)
Platelets: 123 10*3/uL — ABNORMAL LOW (ref 150–400)
RBC: 5.47 MIL/uL (ref 4.22–5.81)
RDW: 14 % (ref 11.5–15.5)
WBC: 10.3 10*3/uL (ref 4.0–10.5)

## 2014-10-07 LAB — BASIC METABOLIC PANEL
Anion gap: 16 — ABNORMAL HIGH (ref 5–15)
BUN: 12 mg/dL (ref 6–23)
CHLORIDE: 98 meq/L (ref 96–112)
CO2: 24 meq/L (ref 19–32)
CREATININE: 1 mg/dL (ref 0.50–1.35)
Calcium: 9.7 mg/dL (ref 8.4–10.5)
GFR calc Af Amer: >90 mL/min (ref >90)
GFR calc non Af Amer: >90 mL/min (ref >90)
Glucose, Bld: 115 mg/dL — ABNORMAL HIGH (ref 70–99)
Potassium: 4 mEq/L (ref 3.7–5.3)
Sodium: 138 mEq/L (ref 137–147)

## 2014-10-07 LAB — LIPASE, BLOOD: Lipase: 25 U/L (ref 11–59)

## 2014-10-07 IMAGING — CR DG CHEST 2V
2 series · 2 of 2 positions shown · non-contrast
Comparison: Two-view chest x-ray [DATE].

CLINICAL DATA: Smoker with cough and shortness of breath for 4
days.

EXAM:
CHEST  2 VIEW

[w chest pa]
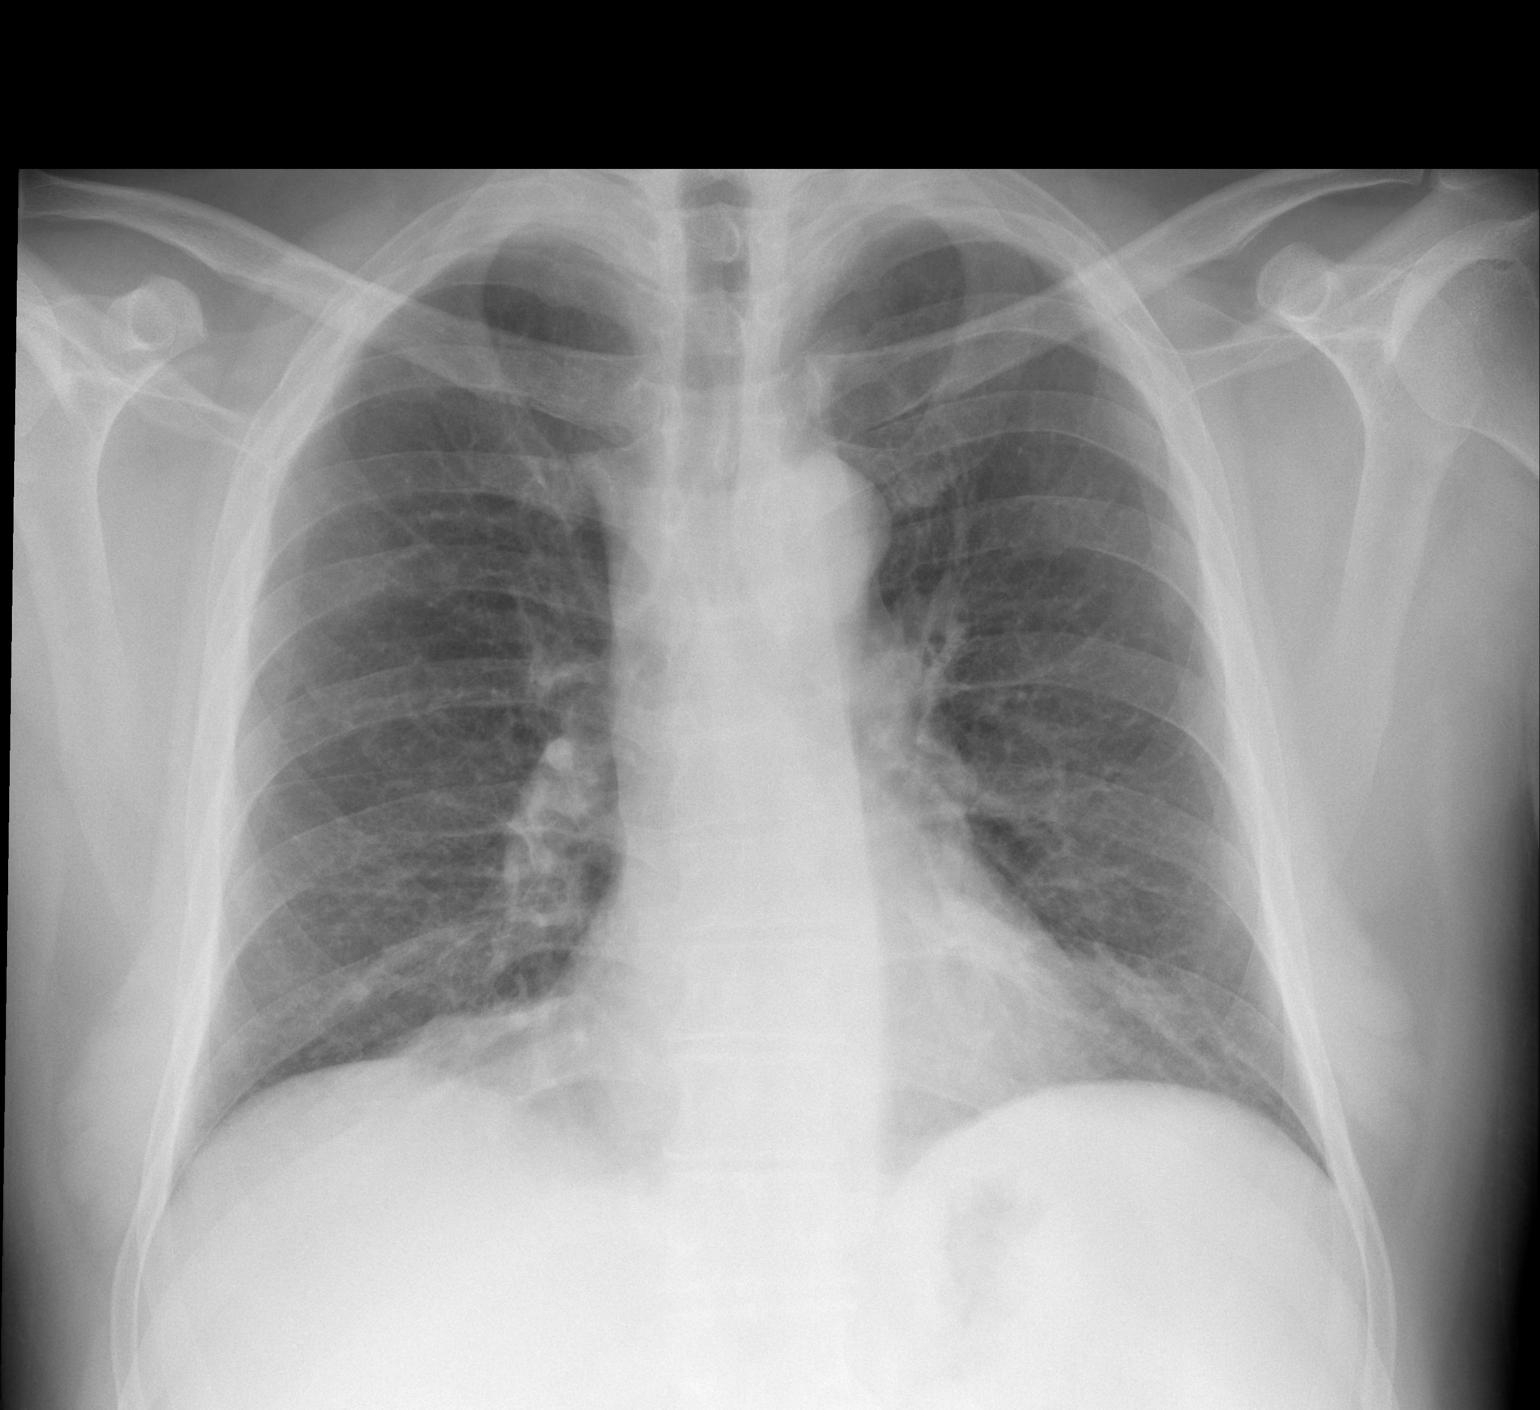

[w chest lat]
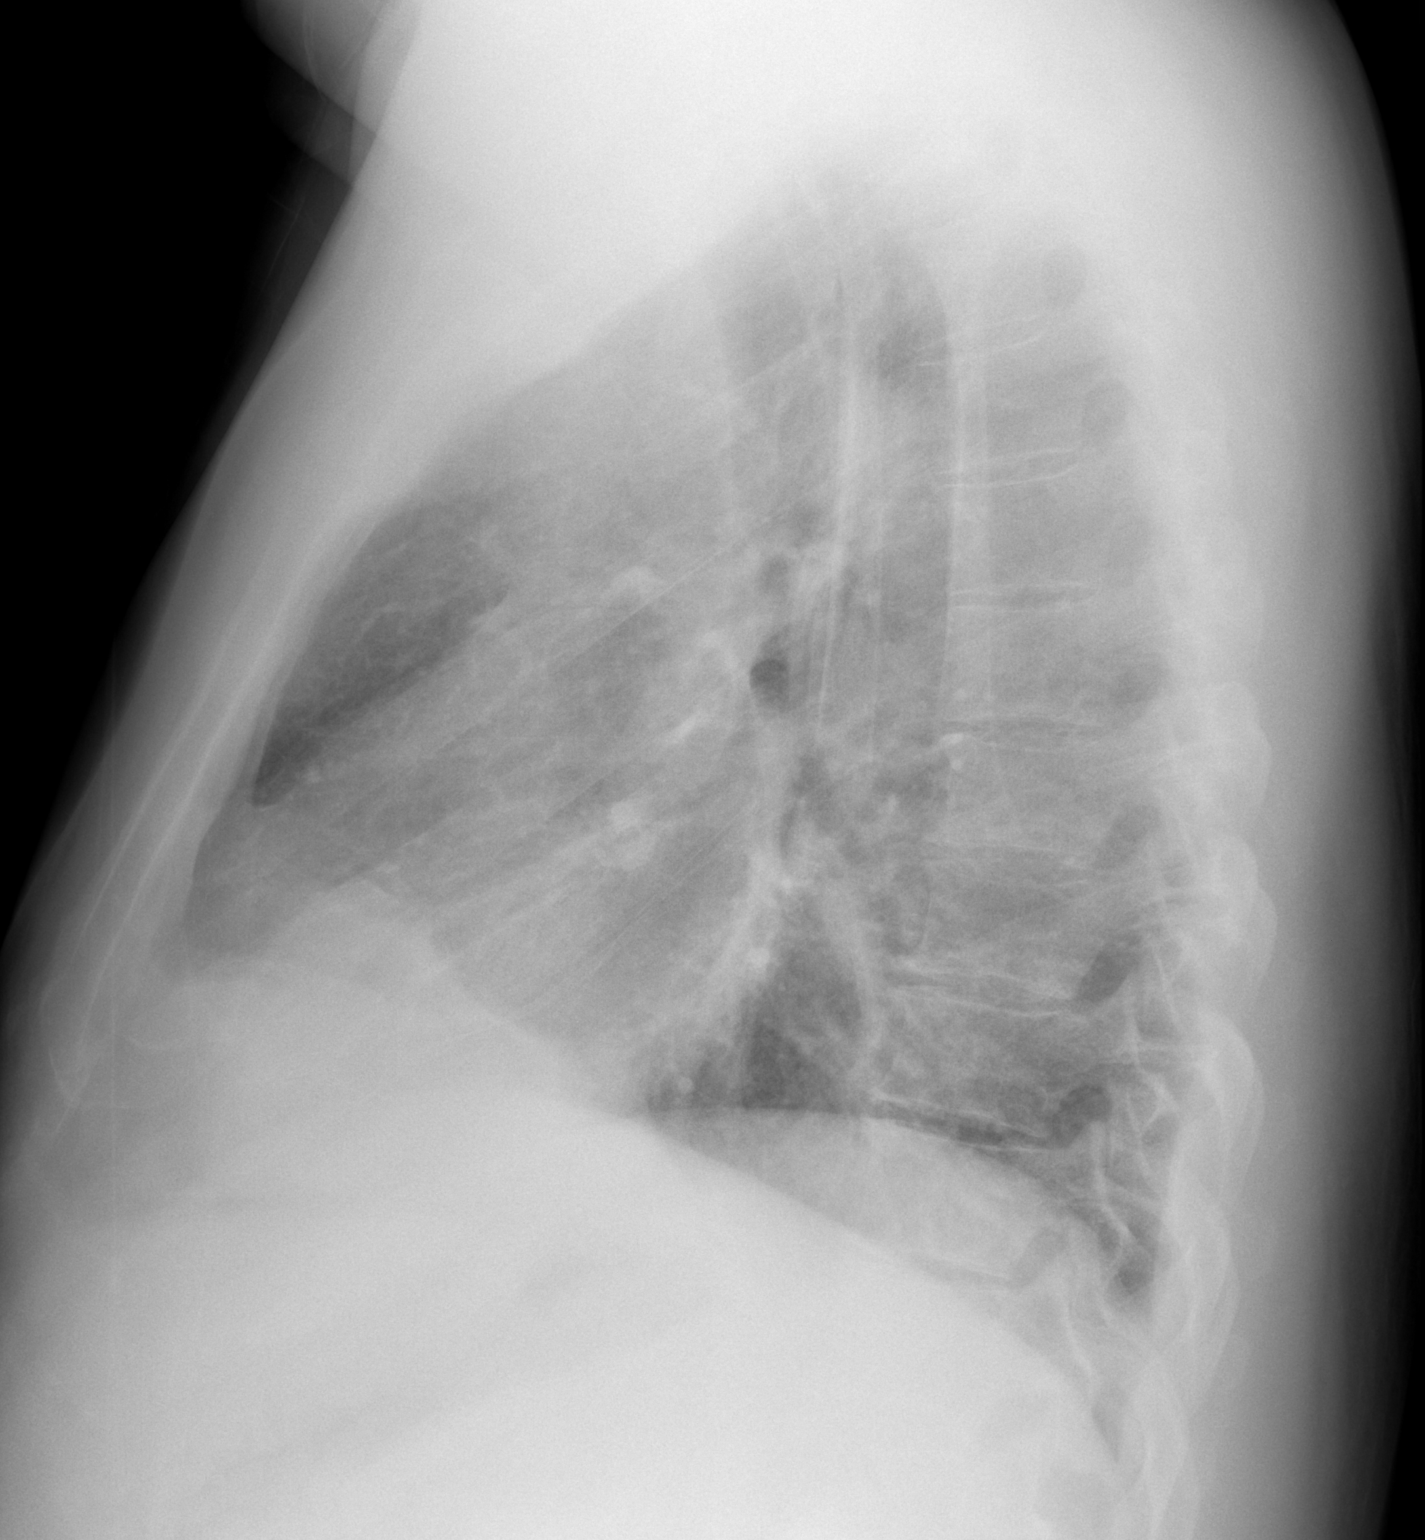

[2 of 2 positions shown; findings below may reference images not displayed]

FINDINGS: Heart size is normal. Mild interstitial coarsening is chronic. No
focal airspace disease is evident. The visualized soft tissues and
bony thorax are unremarkable.
IMPRESSION: 1. No acute cardiopulmonary disease.
2. Stable chronic interstitial coarsening.

## 2014-10-07 IMAGING — CT CT ABD-PELV W/ CM
2 of 5 series · 16 of 46 positions shown, 18 images · IV contrast (APPLIED)
Comparison: [DATE]

CLINICAL DATA: Left lower quadrant pain and tenderness

EXAM:
CT ABDOMEN AND PELVIS WITH CONTRAST
TECHNIQUE: Multidetector CT imaging of the abdomen and pelvis was performed
using the standard protocol following bolus administration of
intravenous contrast.
CONTRAST:  25mL OMNIPAQUE IOHEXOL 300 MG/ML SOLN, 100mL OMNIPAQUE
IOHEXOL 300 MG/ML SOLN

[Series 2: abd/pelvis 5.0 b31f · axial · 0.83mm/px · z∈[-510,-10]mm · 13 of 112 slices shown, 15 images]
[im 6/112  soft-tissue]
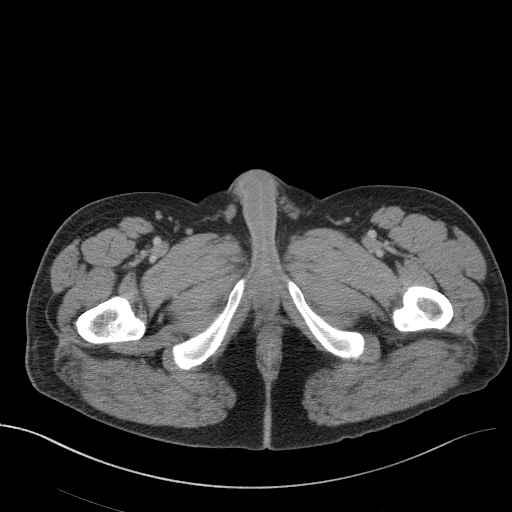
[im 6/112  bone]
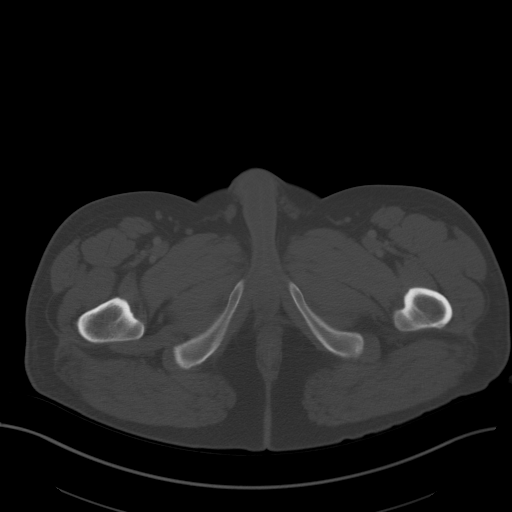
[im 17/112  soft-tissue]
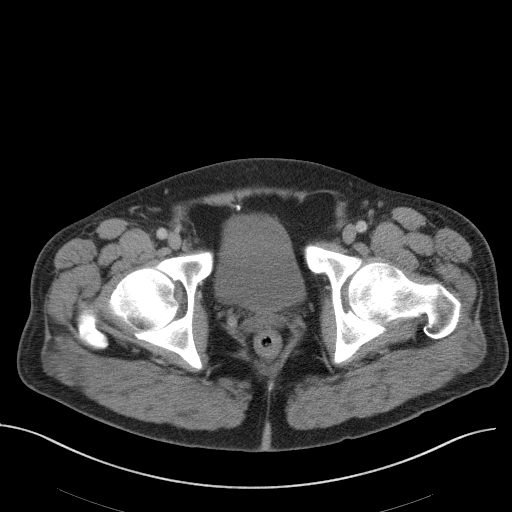
[im 23/112  soft-tissue]
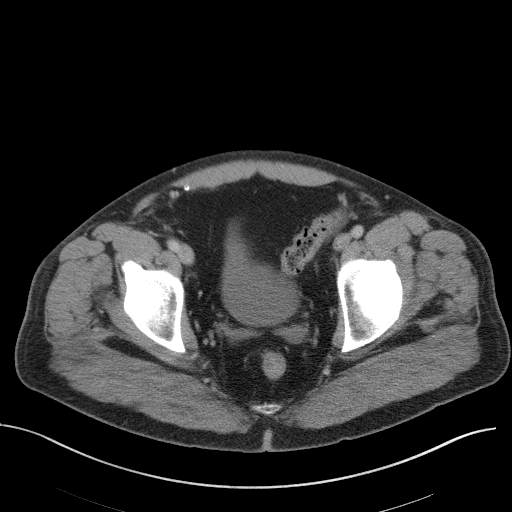
[im 34/112  soft-tissue]
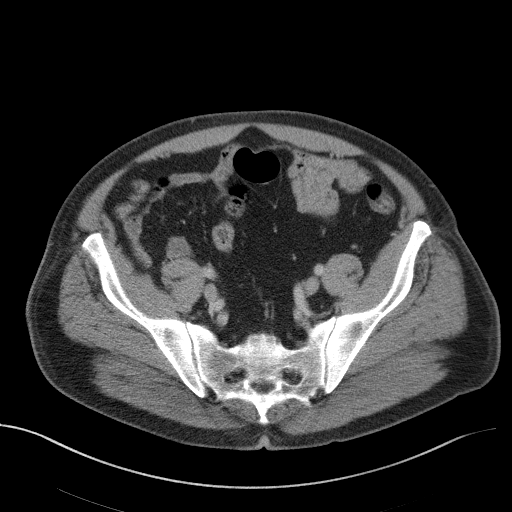
[im 39/112  soft-tissue]
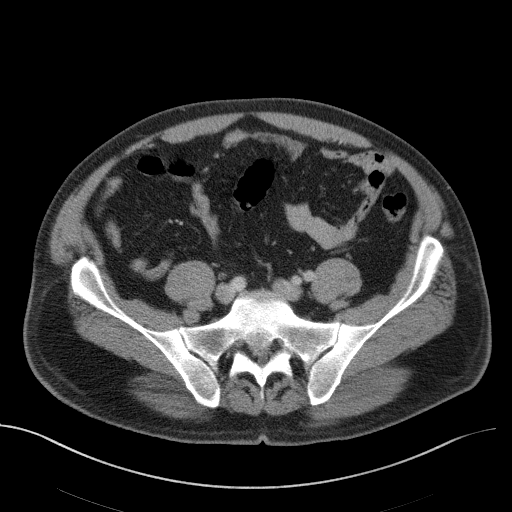
[im 50/112  soft-tissue]
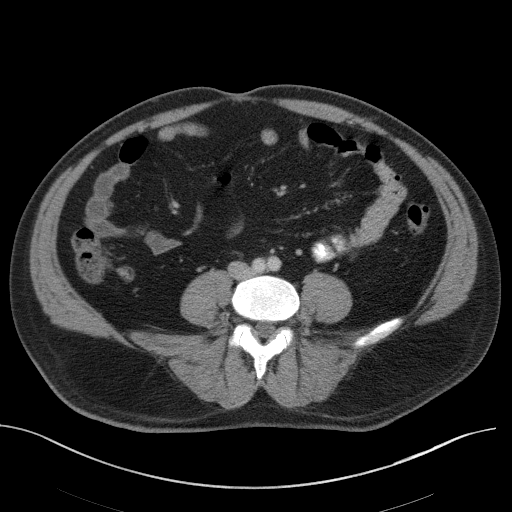
[im 56/112  soft-tissue]
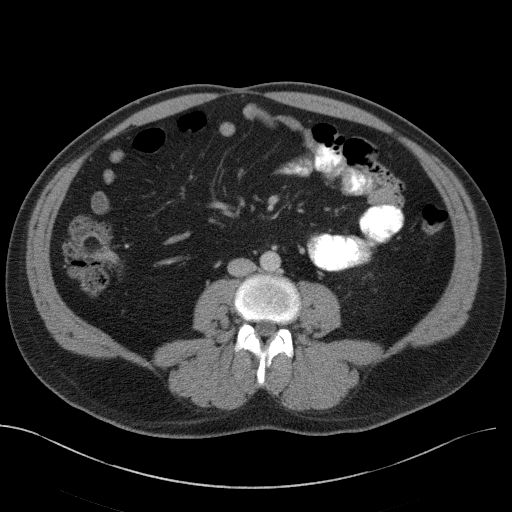
[im 62/112  soft-tissue]
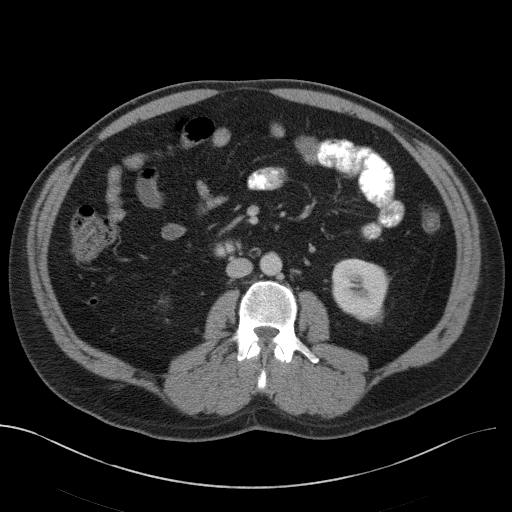
[im 73/112  soft-tissue]
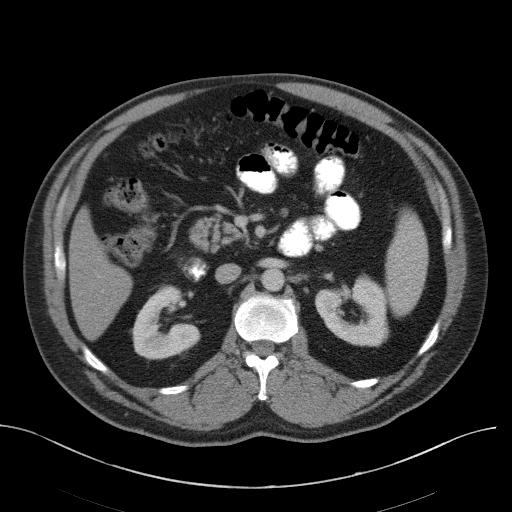
[im 73/112  bone]
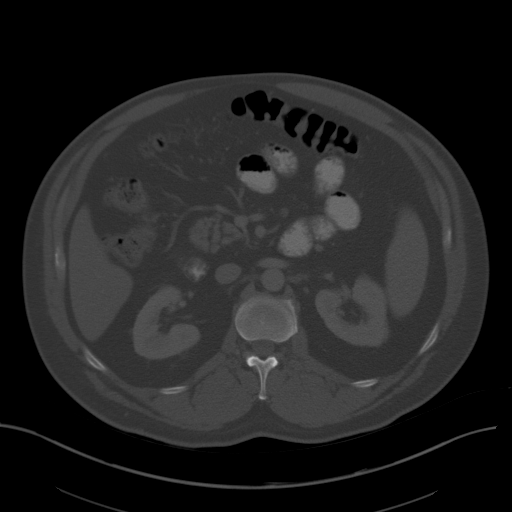
[im 78/112  soft-tissue]
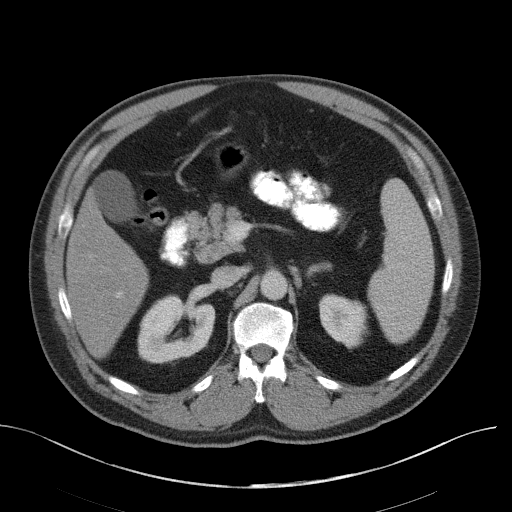
[im 89/112  soft-tissue]
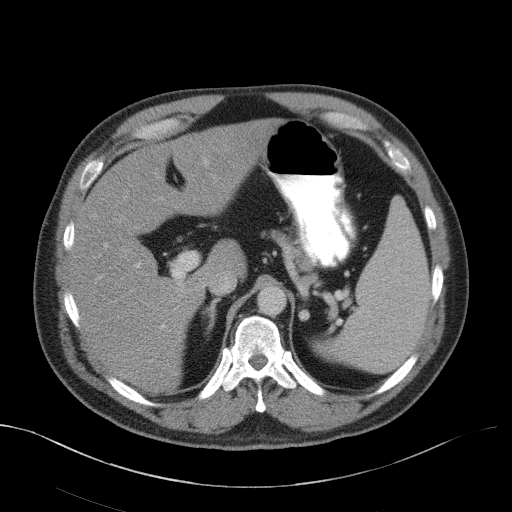
[im 95/112  soft-tissue]
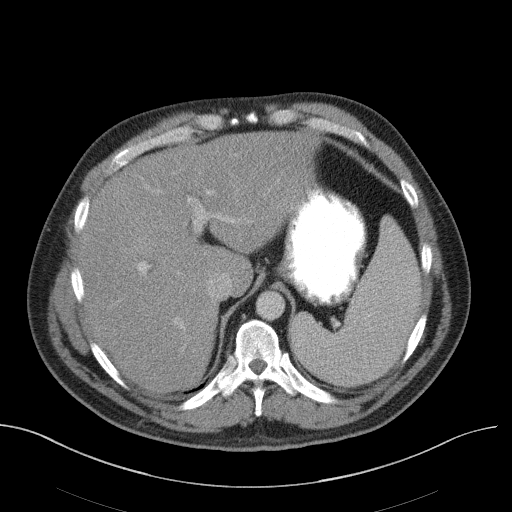
[im 106/112  soft-tissue]
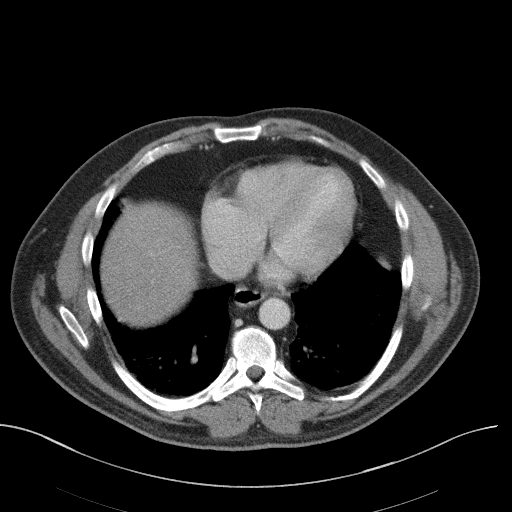

[Series 5: abd/pelvis 3.0 coronal · coronal · 0.81mm/px · 3 of 104 slices shown]
[im 35/104  soft-tissue]
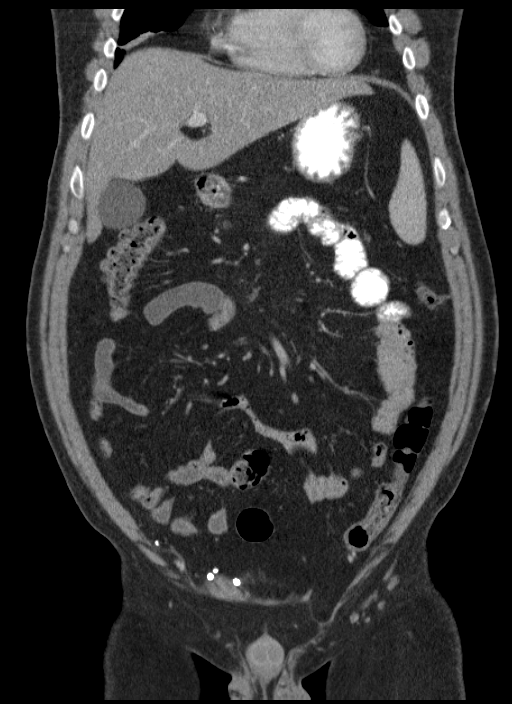
[im 46/104  soft-tissue]
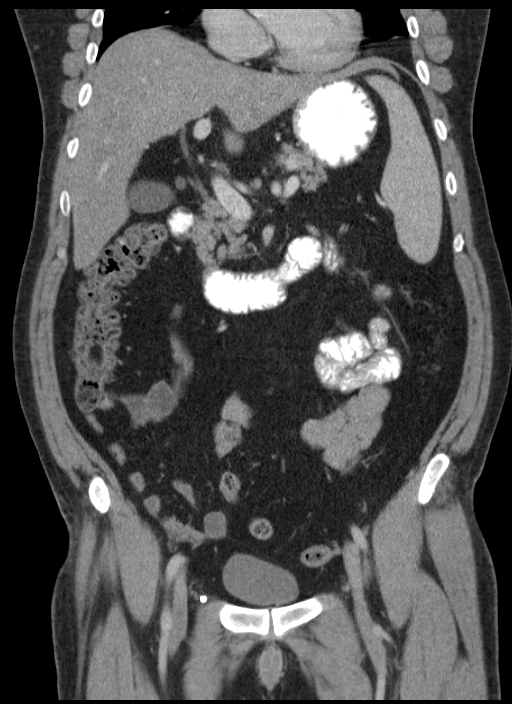
[im 58/104  soft-tissue]
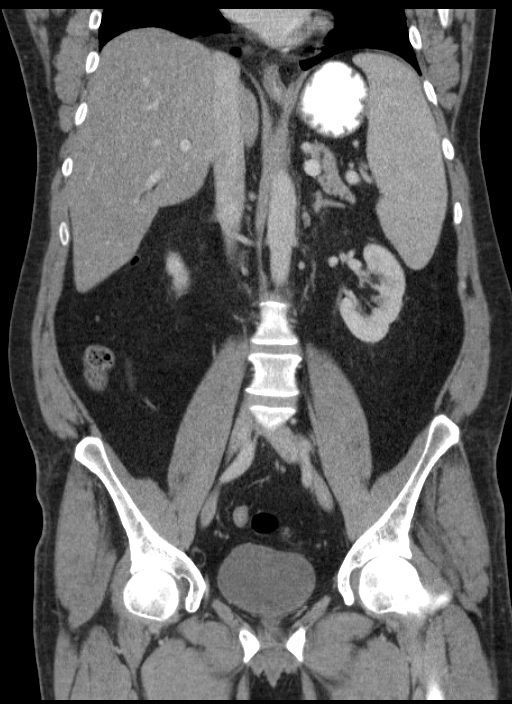

[16 of 46 positions shown; findings below may reference images not displayed]

FINDINGS: The lung bases are free of acute infiltrate or sizable effusion.

The liver is fatty infiltrated. The gallbladder, spleen, adrenal
glands and pancreas are normal in their CT appearance. The kidneys
are well visualized bilaterally and demonstrate a normal enhancement
pattern. No calculi or obstructive changes are seen.

The appendix is within normal limits. Very minimal diverticular
change is noted without evidence of diverticulitis. No free air is
seen. No abnormal adenopathy is noted. The bladder is well
distended. Prostatic calcifications are seen. The osseous structures
show no acute abnormality.
IMPRESSION: Fatty liver.

No other focal abnormality is noted.

## 2014-10-07 MED ORDER — IOHEXOL 300 MG/ML  SOLN
100.0000 mL | Freq: Once | INTRAMUSCULAR | Status: AC | PRN
  Administered 2014-10-07: 100 mL via INTRAVENOUS

## 2014-10-07 MED ORDER — ONDANSETRON 4 MG PO TBDP: 4.0000 mg | ORAL_TABLET | Freq: Three times a day (TID) | ORAL | Status: DC | PRN

## 2014-10-07 MED ORDER — MORPHINE SULFATE 4 MG/ML IJ SOLN
4.0000 mg | Freq: Once | INTRAMUSCULAR | Status: AC
  Administered 2014-10-07: 4 mg via INTRAVENOUS
  Filled 2014-10-07: qty 1

## 2014-10-07 MED ORDER — TRAMADOL HCL 50 MG PO TABS: 50.0000 mg | ORAL_TABLET | Freq: Four times a day (QID) | ORAL | Status: DC | PRN

## 2014-10-07 MED ORDER — IOHEXOL 300 MG/ML  SOLN
25.0000 mL | Freq: Once | INTRAMUSCULAR | Status: AC | PRN
  Administered 2014-10-07: 25 mL via ORAL

## 2014-10-07 MED ORDER — HYDROMORPHONE HCL 1 MG/ML IJ SOLN
1.0000 mg | Freq: Once | INTRAMUSCULAR | Status: AC
  Administered 2014-10-07: 1 mg via INTRAVENOUS
  Filled 2014-10-07: qty 1

## 2014-10-07 MED ORDER — ONDANSETRON HCL 4 MG/2ML IJ SOLN
4.0000 mg | Freq: Once | INTRAMUSCULAR | Status: AC
  Administered 2014-10-07: 4 mg via INTRAVENOUS
  Filled 2014-10-07: qty 2

## 2014-10-07 MED ORDER — HYDROCODONE-HOMATROPINE 5-1.5 MG/5ML PO SYRP: 5.0000 mL | ORAL_SOLUTION | Freq: Four times a day (QID) | ORAL | Status: DC | PRN

## 2014-10-07 MED ORDER — POLYMYXIN B-TRIMETHOPRIM 10000-0.1 UNIT/ML-% OP SOLN: 1.0000 [drp] | OPHTHALMIC | Status: DC

## 2014-10-07 MED ORDER — DIAZEPAM 5 MG/ML IJ SOLN
5.0000 mg | Freq: Once | INTRAMUSCULAR | Status: AC
  Administered 2014-10-07: 5 mg via INTRAVENOUS
  Filled 2014-10-07: qty 2

## 2014-10-07 MED ORDER — SODIUM CHLORIDE 0.9 % IV BOLUS (SEPSIS)
1000.0000 mL | Freq: Once | INTRAVENOUS | Status: AC
  Administered 2014-10-07: 1000 mL via INTRAVENOUS

## 2014-10-07 NOTE — ED Provider Notes (Signed)
CSN: [id number]     Arrival date & time 10/07/14  1138 History   First MD Initiated Contact with Patient 10/07/14 1210     Chief Complaint  Patient presents with  . Emesis     (Consider location/radiation/quality/duration/timing/severity/associated sxs/prior Treatment) HPI Comments: Patient is a 43 year old male with a past medical history of asthma who presents with abdominal pain. The pain is located in his left abdomen and does not radiate. The pain is described as aching and mild. The pain started gradually and progressively worsened since the onset. No alleviating/aggravating factors. The patient has tried nothing for symptoms without relief. Associated symptoms include body aches, generalized weakness, fever, and nausea. Patient denies headache, chest pain, SOB, dysuria, constipation.   Past Medical History  Diagnosis Date  . Spleen enlarged   . Asthma    Past Surgical History  Procedure Laterality Date  . Hernia repair     No family history on file. History  Substance Use Topics  . Smoking status: Current Every Day Smoker -- 0.50 packs/day    Types: Cigarettes  . Smokeless tobacco: Not on file  . Alcohol Use: No    Review of Systems  Constitutional: Positive for fever. Negative for chills and fatigue.  HENT: Negative for trouble swallowing.   Eyes: Positive for discharge and redness. Negative for visual disturbance.  Respiratory: Negative for shortness of breath.   Cardiovascular: Negative for chest pain and palpitations.  Gastrointestinal: Positive for nausea, abdominal pain and diarrhea. Negative for vomiting.  Genitourinary: Negative for dysuria and difficulty urinating.  Musculoskeletal: Negative for arthralgias and neck pain.  Skin: Negative for color change.  Neurological: Positive for weakness. Negative for dizziness.  Psychiatric/Behavioral: Negative for dysphoric mood.      Allergies  Review of patient's allergies indicates no known allergies.  Home  Medications   Prior to Admission medications   Medication Sig Start Date End Date Taking? Authorizing Provider  promethazine (PHENERGAN) 25 MG tablet Take 1 tablet (25 mg total) by mouth every 6 (six) hours as needed for nausea or vomiting. 12/03/13  Yes Hope Bunnie Pion, NP   BP 111/73 mmHg  Pulse 65  Temp(Src) 97.8 F (36.6 C) (Oral)  Resp 18  Wt 220 lb (99.791 kg)  SpO2 98% Physical Exam  Constitutional: He is oriented to person, place, and time. He appears well-developed and well-nourished. No distress.  HENT:  Head: Normocephalic and atraumatic.  Eyes: EOM are normal. Pupils are equal, round, and reactive to light.  Left conjunctival injection with yellow discharge.   Neck: Normal range of motion.  Cardiovascular: Normal rate and regular rhythm.  Exam reveals no gallop and no friction rub.   No murmur heard. Pulmonary/Chest: Effort normal and breath sounds normal. He has no wheezes. He has no rales. He exhibits no tenderness.  Abdominal: Soft. He exhibits no distension. There is tenderness. There is no rebound.  Mild left lower tenderness to palpation. No focal tenderness or peritoneal signs.   Musculoskeletal: Normal range of motion.  Neurological: He is alert and oriented to person, place, and time. Coordination normal.  Speech is goal-oriented. Moves limbs without ataxia.   Skin: Skin is warm and dry.  Psychiatric: He has a normal mood and affect. His behavior is normal.  Nursing note and vitals reviewed.   ED Course  Procedures (including critical care time) Labs Review Labs Reviewed  CBC WITH DIFFERENTIAL - Abnormal; Notable for the following:    Platelets 123 (*)    Neutrophils  Relative % 78 (*)    Neutro Abs 7.9 (*)    Lymphocytes Relative 11 (*)    All other components within normal limits  BASIC METABOLIC PANEL - Abnormal; Notable for the following:    Glucose, Bld 115 (*)    Anion gap 16 (*)    All other components within normal limits  HEPATIC FUNCTION PANEL  - Abnormal; Notable for the following:    AST 40 (*)    ALT 64 (*)    All other components within normal limits  LIPASE, BLOOD    Imaging Review Dg Chest 2 View  10/07/2014   CLINICAL DATA:  Smoker with cough and shortness of breath for 4 days.  EXAM: CHEST  2 VIEW  COMPARISON:  Two-view chest x-ray 12/03/2013.  FINDINGS: Heart size is normal. Mild interstitial coarsening is chronic. No focal airspace disease is evident. The visualized soft tissues and bony thorax are unremarkable.  IMPRESSION: 1. No acute cardiopulmonary disease. 2. Stable chronic interstitial coarsening.   Electronically Signed   By: Lawrence Santiago M.D.   On: 10/07/2014 12:32   Ct Abdomen Pelvis W Contrast  10/07/2014   CLINICAL DATA:  Left lower quadrant pain and tenderness  EXAM: CT ABDOMEN AND PELVIS WITH CONTRAST  TECHNIQUE: Multidetector CT imaging of the abdomen and pelvis was performed using the standard protocol following bolus administration of intravenous contrast.  CONTRAST:  50mL OMNIPAQUE IOHEXOL 300 MG/ML SOLN, 152mL OMNIPAQUE IOHEXOL 300 MG/ML SOLN  COMPARISON:  05/31/2014  FINDINGS: The lung bases are free of acute infiltrate or sizable effusion.  The liver is fatty infiltrated. The gallbladder, spleen, adrenal glands and pancreas are normal in their CT appearance. The kidneys are well visualized bilaterally and demonstrate a normal enhancement pattern. No calculi or obstructive changes are seen.  The appendix is within normal limits. Very minimal diverticular change is noted without evidence of diverticulitis. No free air is seen. No abnormal adenopathy is noted. The bladder is well distended. Prostatic calcifications are seen. The osseous structures show no acute abnormality.  IMPRESSION: Fatty liver.  No other focal abnormality is noted.   Electronically Signed   By: Inez Catalina M.D.   On: 10/07/2014 16:04     EKG Interpretation None      MDM   Final diagnoses:  Abdominal pain  Viral illness   Conjunctivitis of left eye    2:07 PM Labs and chest xray unremarkable for acute changes. Vitals stable and patient afebrile. Patient will given IV fluids and pain medication.   5:00 PM CT scan unremarkable for acute changes. Patient reports improvement of symptoms. Patient likely has a viral illness and will be discharged with hycodan, zofran, tramadol, and polytrim. No further evaluation needed at this time.    Alvina Chou, PA-C 10/07/14 [place], DO 10/08/14 [phone]

## 2014-10-07 NOTE — ED Notes (Addendum)
Patient here with general body aches, fever, vomiting and persistent cough. Feels weak and reports only 1 day of diarrhea. Fever 101 this am per spouse. Left eye with drainage and itching for the past couple of days

## 2014-10-07 NOTE — Discharge Instructions (Signed)
Take hycodan as needed for cough. Take tramadol as needed for pain. Take zofran for nausea. Use polytrim drops for conjunctivitis. Refer to attached documents for more information.

## 2015-02-12 ENCOUNTER — Other Ambulatory Visit (HOSPITAL_COMMUNITY): Payer: Self-pay

## 2015-02-12 ENCOUNTER — Emergency Department (HOSPITAL_COMMUNITY): Payer: Self-pay

## 2015-02-12 ENCOUNTER — Observation Stay (HOSPITAL_COMMUNITY)
Admission: EM | Admit: 2015-02-12 | Discharge: 2015-02-14 | Disposition: A | Discharge status: Home / Self Care | Payer: Self-pay | Attending: Family Medicine | Admitting: Family Medicine

## 2015-02-12 ENCOUNTER — Encounter (HOSPITAL_COMMUNITY): Payer: Self-pay | Admitting: *Deleted

## 2015-02-12 DIAGNOSIS — J45909 Unspecified asthma, uncomplicated: Secondary | ICD-10-CM | POA: Diagnosis present

## 2015-02-12 DIAGNOSIS — D696 Thrombocytopenia, unspecified: Secondary | ICD-10-CM | POA: Insufficient documentation

## 2015-02-12 DIAGNOSIS — R059 Cough, unspecified: Secondary | ICD-10-CM | POA: Diagnosis present

## 2015-02-12 DIAGNOSIS — R188 Other ascites: Secondary | ICD-10-CM

## 2015-02-12 DIAGNOSIS — Z720 Tobacco use: Secondary | ICD-10-CM

## 2015-02-12 DIAGNOSIS — R197 Diarrhea, unspecified: Secondary | ICD-10-CM

## 2015-02-12 DIAGNOSIS — R079 Chest pain, unspecified: Principal | ICD-10-CM | POA: Insufficient documentation

## 2015-02-12 DIAGNOSIS — R112 Nausea with vomiting, unspecified: Secondary | ICD-10-CM | POA: Diagnosis present

## 2015-02-12 DIAGNOSIS — R05 Cough: Secondary | ICD-10-CM | POA: Diagnosis present

## 2015-02-12 DIAGNOSIS — J45901 Unspecified asthma with (acute) exacerbation: Secondary | ICD-10-CM | POA: Insufficient documentation

## 2015-02-12 HISTORY — DX: Unspecified osteoarthritis, unspecified site: M19.90

## 2015-02-12 HISTORY — DX: Tobacco use: Z72.0

## 2015-02-12 HISTORY — DX: Gastro-esophageal reflux disease without esophagitis: K21.9

## 2015-02-12 HISTORY — DX: Thrombocytopenia, unspecified: D69.6

## 2015-02-12 HISTORY — DX: Unspecified convulsions: R56.9

## 2015-02-12 LAB — BASIC METABOLIC PANEL
Anion gap: 10 (ref 5–15)
BUN: 6 mg/dL (ref 6–23)
CO2: 23 mmol/L (ref 19–32)
Calcium: 9.8 mg/dL (ref 8.4–10.5)
Chloride: 103 mmol/L (ref 96–112)
Creatinine, Ser: 1.06 mg/dL (ref 0.50–1.35)
GFR calc Af Amer: >90 mL/min (ref >90)
GFR calc non Af Amer: 84 mL/min — ABNORMAL LOW (ref >90)
GLUCOSE: 100 mg/dL — AB (ref 70–99)
POTASSIUM: 3.9 mmol/L (ref 3.5–5.1)
Sodium: 136 mmol/L (ref 135–145)

## 2015-02-12 LAB — CBC
HCT: 46.6 % (ref 39.0–52.0)
Hemoglobin: 16.7 g/dL (ref 13.0–17.0)
MCH: 29.3 pg (ref 26.0–34.0)
MCHC: 35.8 g/dL (ref 30.0–36.0)
MCV: 81.9 fL (ref 78.0–100.0)
Platelets: 122 10*3/uL — ABNORMAL LOW (ref 150–400)
RBC: 5.69 MIL/uL (ref 4.22–5.81)
RDW: 14 % (ref 11.5–15.5)
WBC: 7.6 10*3/uL (ref 4.0–10.5)

## 2015-02-12 LAB — D-DIMER, QUANTITATIVE: D-Dimer, Quant: <0.27 ug/mL-FEU (ref 0.00–0.48)

## 2015-02-12 LAB — I-STAT TROPONIN, ED: Troponin i, poc: 0 ng/mL (ref 0.00–0.08)

## 2015-02-12 IMAGING — DX DG CHEST 2V
2 series · 2 of 2 positions shown · non-contrast
Comparison: [DATE]

CLINICAL DATA: Chest pain since 9 a.m. today.

EXAM:
CHEST  2 VIEW

[chest pa]
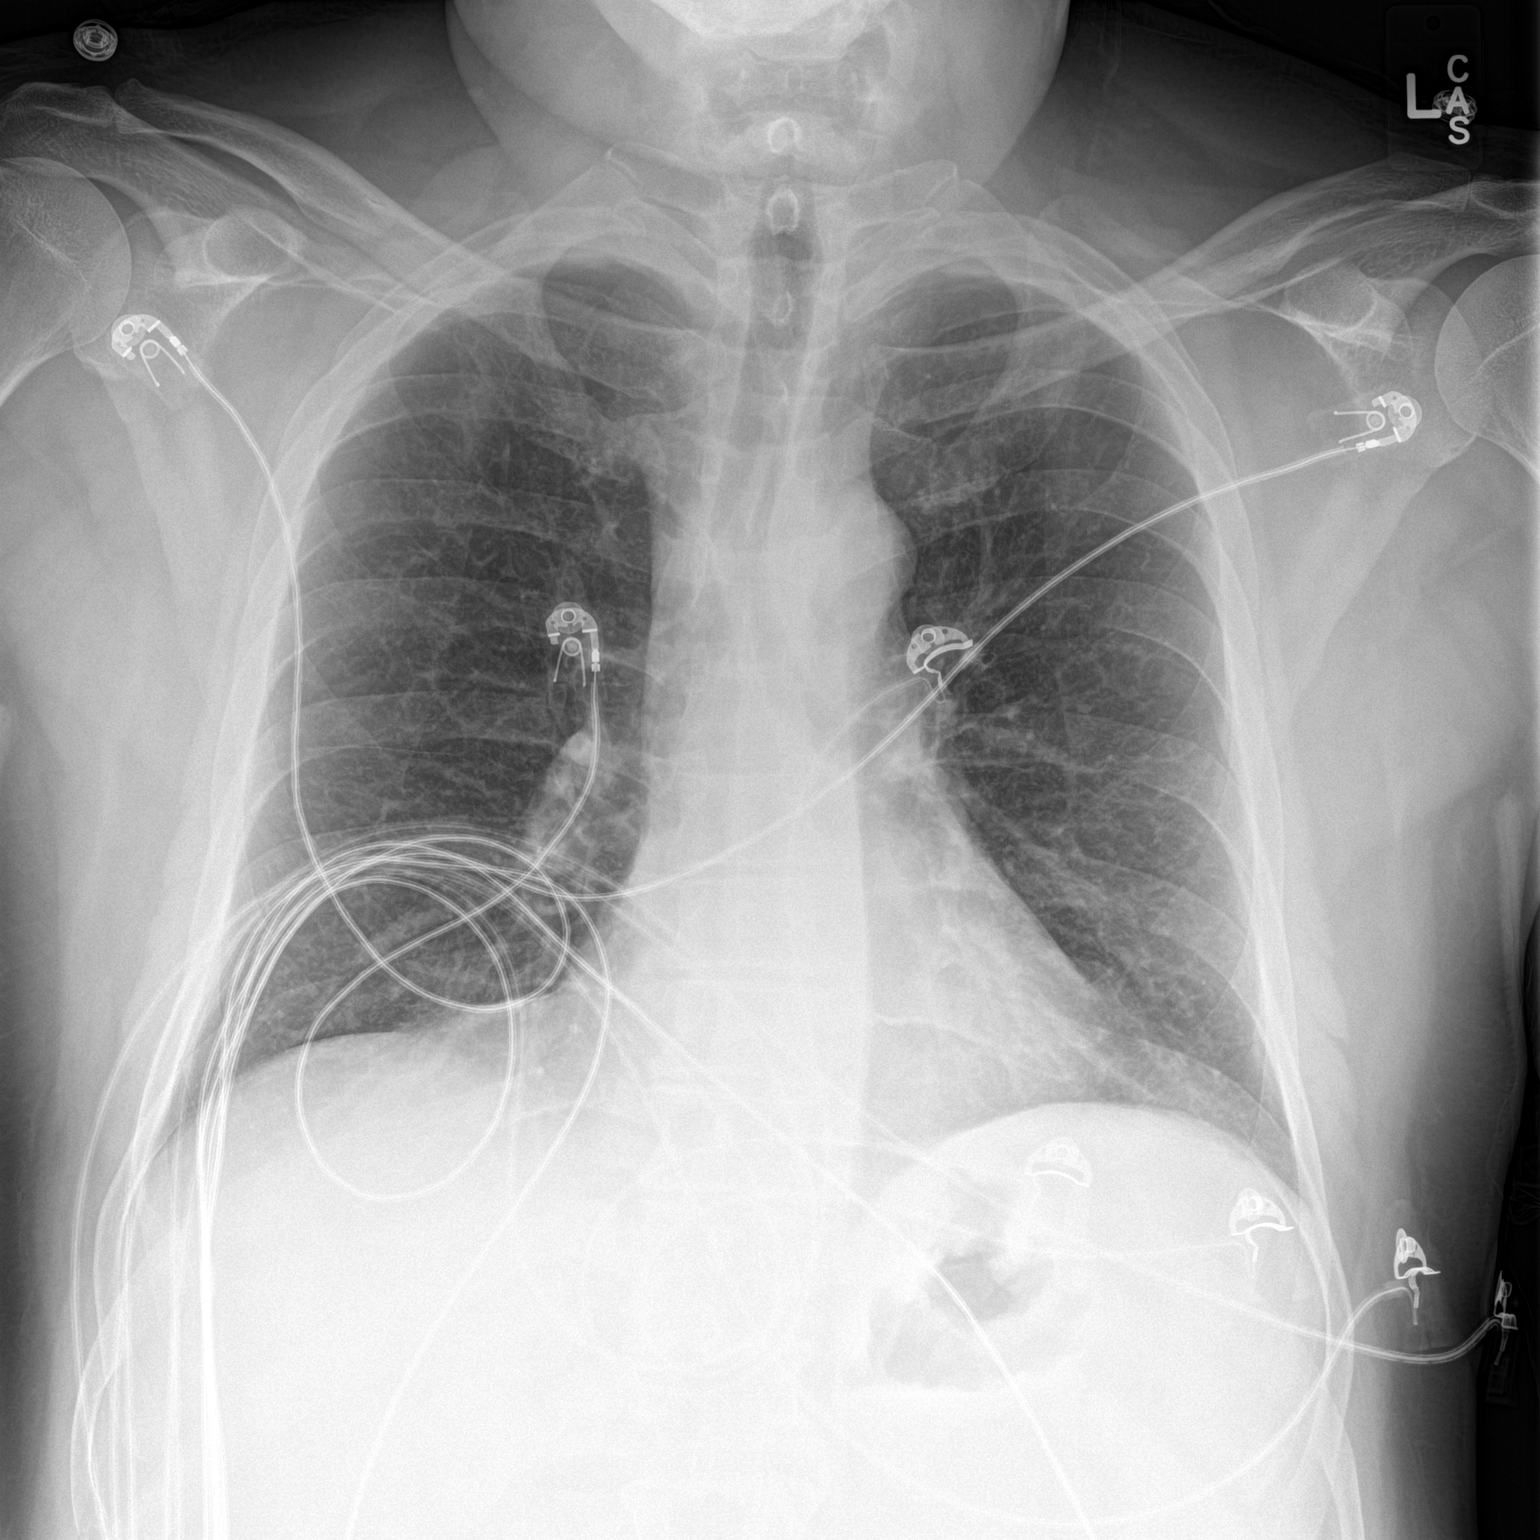

[chest lat]
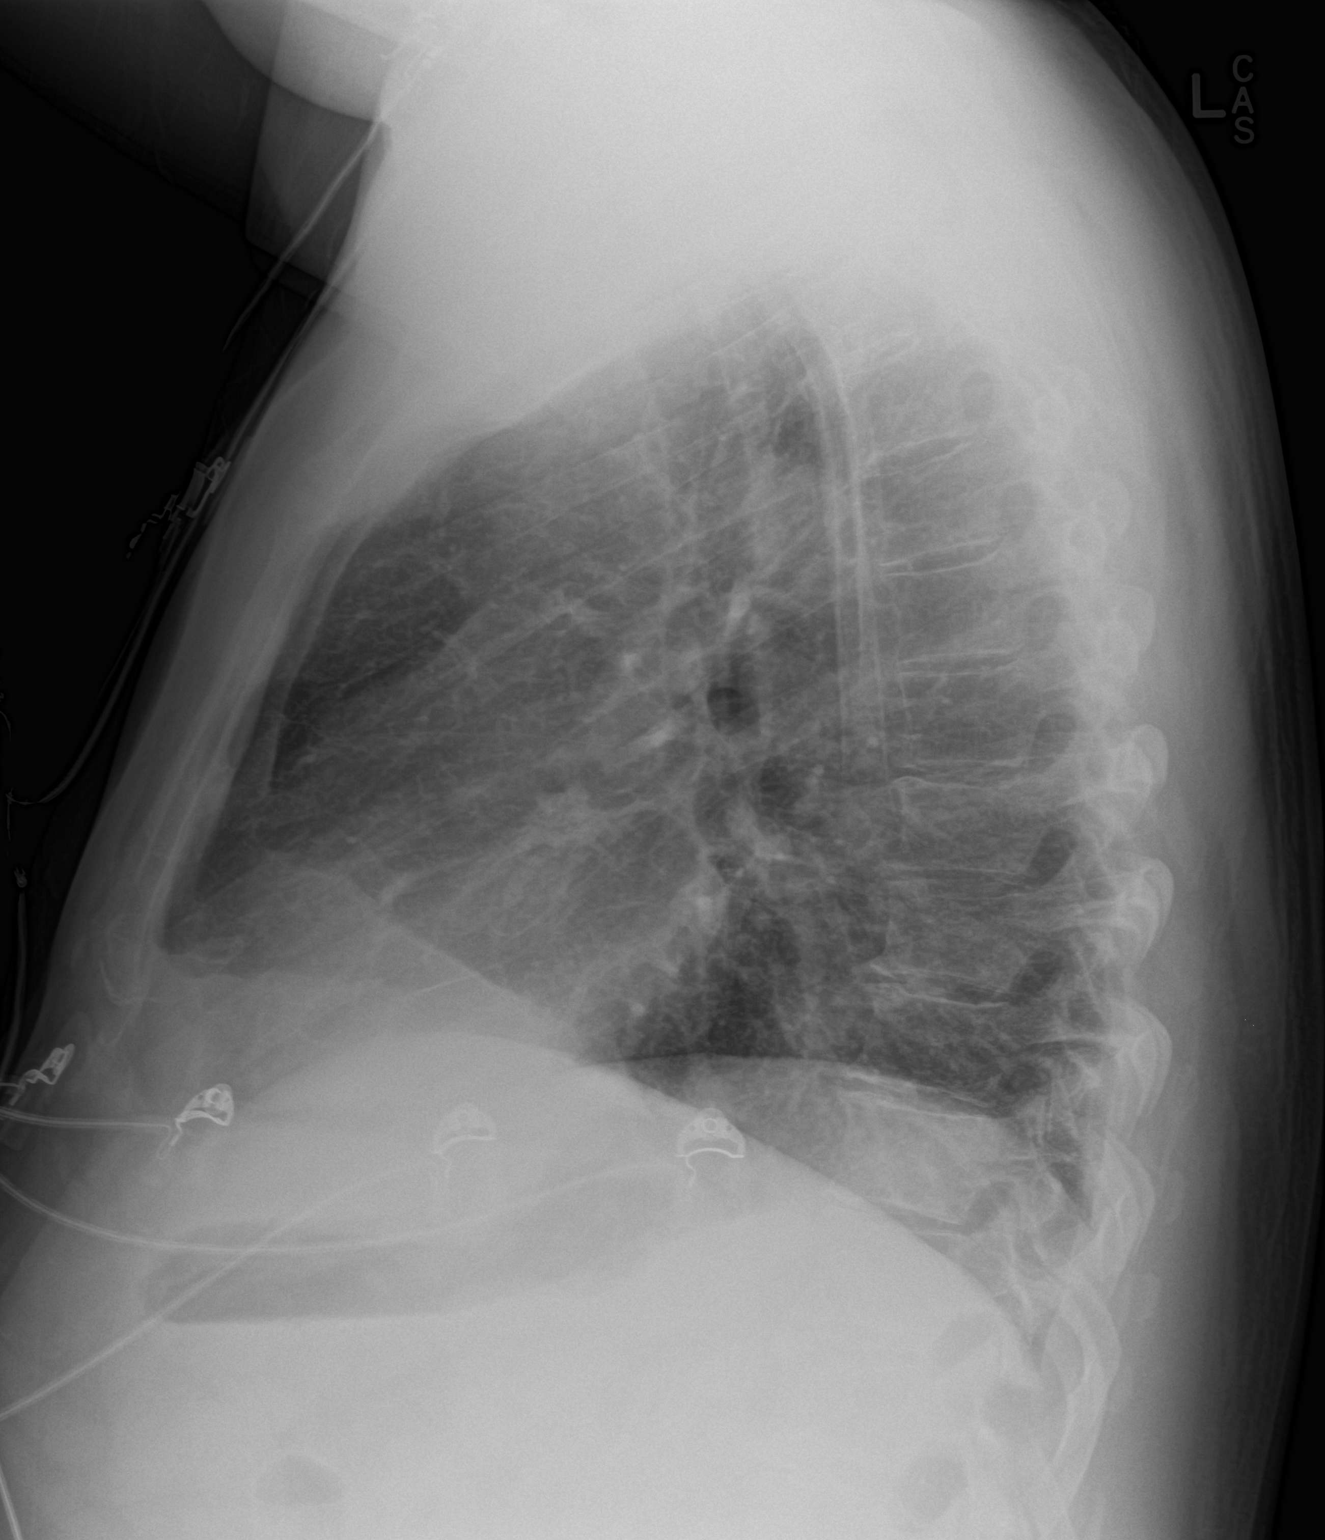

[2 of 2 positions shown; findings below may reference images not displayed]

FINDINGS: The heart size and mediastinal contours are within normal limits.
There is mild increased pulmonary interstitium bilaterally. There is
no focal pneumonia or pleural effusion. The visualized skeletal
structures are stable.
IMPRESSION: Mild increased pulmonary interstitium bilaterally, this can be seen
in bronchitis.

## 2015-02-12 MED ORDER — ACETAMINOPHEN 325 MG PO TABS
650.0000 mg | ORAL_TABLET | Freq: Four times a day (QID) | ORAL | Status: DC | PRN
  Administered 2015-02-13: 650 mg via ORAL
  Filled 2015-02-12: qty 2

## 2015-02-12 MED ORDER — NITROGLYCERIN 2 % TD OINT
1.0000 [in_us] | TOPICAL_OINTMENT | Freq: Once | TRANSDERMAL | Status: AC
  Administered 2015-02-12: 1 [in_us] via TOPICAL
  Filled 2015-02-12: qty 1

## 2015-02-12 MED ORDER — NITROGLYCERIN 0.4 MG SL SUBL: 0.4000 mg | SUBLINGUAL_TABLET | SUBLINGUAL | Status: DC | PRN

## 2015-02-12 MED ORDER — SODIUM CHLORIDE 0.9 % IV SOLN
INTRAVENOUS | Status: DC
  Administered 2015-02-12 – 2015-02-14 (×5): via INTRAVENOUS

## 2015-02-12 MED ORDER — ACETAMINOPHEN 650 MG RE SUPP: 650.0000 mg | Freq: Four times a day (QID) | RECTAL | Status: DC | PRN

## 2015-02-12 MED ORDER — IPRATROPIUM-ALBUTEROL 0.5-2.5 (3) MG/3ML IN SOLN: 3.0000 mL | RESPIRATORY_TRACT | Status: DC | PRN

## 2015-02-12 MED ORDER — ASPIRIN 325 MG PO TABS
325.0000 mg | ORAL_TABLET | Freq: Every day | ORAL | Status: DC
  Administered 2015-02-13 – 2015-02-14 (×2): 325 mg via ORAL
  Filled 2015-02-12 (×2): qty 1

## 2015-02-12 MED ORDER — GI COCKTAIL ~~LOC~~: 30.0000 mL | Freq: Three times a day (TID) | ORAL | Status: DC | PRN

## 2015-02-12 MED ORDER — GI COCKTAIL ~~LOC~~
30.0000 mL | Freq: Once | ORAL | Status: AC
  Administered 2015-02-12: 30 mL via ORAL
  Filled 2015-02-12: qty 30

## 2015-02-12 MED ORDER — SODIUM CHLORIDE 0.9 % IJ SOLN
3.0000 mL | Freq: Two times a day (BID) | INTRAMUSCULAR | Status: DC
  Administered 2015-02-12 – 2015-02-13 (×2): 3 mL via INTRAVENOUS

## 2015-02-12 MED ORDER — ATORVASTATIN CALCIUM 20 MG PO TABS
20.0000 mg | ORAL_TABLET | Freq: Every day | ORAL | Status: DC
  Filled 2015-02-12: qty 1

## 2015-02-12 MED ORDER — MORPHINE SULFATE 2 MG/ML IJ SOLN
2.0000 mg | INTRAMUSCULAR | Status: DC | PRN
  Administered 2015-02-13 (×5): 2 mg via INTRAVENOUS
  Filled 2015-02-12 (×5): qty 1

## 2015-02-12 MED ORDER — ALUM & MAG HYDROXIDE-SIMETH 200-200-20 MG/5ML PO SUSP
30.0000 mL | Freq: Four times a day (QID) | ORAL | Status: DC | PRN
  Administered 2015-02-13: 30 mL via ORAL
  Filled 2015-02-12: qty 30

## 2015-02-12 MED ORDER — ACETAMINOPHEN 325 MG PO TABS: 650.0000 mg | ORAL_TABLET | Freq: Four times a day (QID) | ORAL | Status: DC | PRN

## 2015-02-12 MED ORDER — DM-GUAIFENESIN ER 30-600 MG PO TB12
1.0000 | ORAL_TABLET | Freq: Two times a day (BID) | ORAL | Status: DC
  Administered 2015-02-13 – 2015-02-14 (×4): 1 via ORAL
  Filled 2015-02-12 (×4): qty 1

## 2015-02-12 MED ORDER — AZITHROMYCIN 250 MG PO TABS
500.0000 mg | ORAL_TABLET | Freq: Every day | ORAL | Status: AC
  Administered 2015-02-13: 500 mg via ORAL
  Filled 2015-02-12: qty 2

## 2015-02-12 MED ORDER — ACETAMINOPHEN 325 MG PO TABS
650.0000 mg | ORAL_TABLET | Freq: Once | ORAL | Status: AC
  Administered 2015-02-12: 650 mg via ORAL
  Filled 2015-02-12: qty 2

## 2015-02-12 MED ORDER — AZITHROMYCIN 250 MG PO TABS
250.0000 mg | ORAL_TABLET | Freq: Every day | ORAL | Status: DC
  Administered 2015-02-13 – 2015-02-14 (×2): 250 mg via ORAL
  Filled 2015-02-12 (×2): qty 1

## 2015-02-12 MED ORDER — HEPARIN SODIUM (PORCINE) 5000 UNIT/ML IJ SOLN
5000.0000 [IU] | Freq: Three times a day (TID) | INTRAMUSCULAR | Status: DC
  Administered 2015-02-13 – 2015-02-14 (×4): 5000 [IU] via SUBCUTANEOUS
  Filled 2015-02-12 (×4): qty 1

## 2015-02-12 MED ORDER — ONDANSETRON HCL 4 MG/2ML IJ SOLN
4.0000 mg | Freq: Four times a day (QID) | INTRAMUSCULAR | Status: DC | PRN
  Administered 2015-02-13 (×2): 4 mg via INTRAVENOUS
  Filled 2015-02-12 (×2): qty 2

## 2015-02-12 MED ORDER — NICOTINE 21 MG/24HR TD PT24
21.0000 mg | MEDICATED_PATCH | TRANSDERMAL | Status: DC
  Administered 2015-02-13 (×2): 21 mg via TRANSDERMAL
  Filled 2015-02-12 (×2): qty 1

## 2015-02-12 MED ORDER — ONDANSETRON HCL 4 MG PO TABS: 4.0000 mg | ORAL_TABLET | Freq: Four times a day (QID) | ORAL | Status: DC | PRN

## 2015-02-12 MED ORDER — NITROGLYCERIN 0.4 MG SL SUBL
0.4000 mg | SUBLINGUAL_TABLET | SUBLINGUAL | Status: AC | PRN
  Administered 2015-02-12 (×3): 0.4 mg via SUBLINGUAL
  Filled 2015-02-12: qty 1

## 2015-02-12 MED ORDER — IPRATROPIUM-ALBUTEROL 0.5-2.5 (3) MG/3ML IN SOLN: 3.0000 mL | RESPIRATORY_TRACT | Status: DC

## 2015-02-12 NOTE — ED Notes (Signed)
Pt returning from [place]

## 2015-02-12 NOTE — ED Provider Notes (Signed)
CSN: [id number]     Arrival date & time 02/12/15  1745 History   First MD Initiated Contact with Patient 02/12/15 1750     Chief Complaint  Patient presents with  . Chest Pain     (Consider location/radiation/quality/duration/timing/severity/associated sxs/prior Treatment) Patient is a 44 y.o. male presenting with chest pain. The history is provided by the patient.  Chest Pain Associated symptoms: cough and shortness of breath   Associated symptoms: no abdominal pain, no back pain, no headache, no nausea, no numbness, not vomiting and no weakness    patient with shortness of breath. Starts in his lower sternum and goes up to his neck. Some shortness of breath with it. No fevers. No changes chronic cough. Not worse with exertion but he still somewhat fatigued. No previous cardiac workup. No family history. States the pain is been going on constantly today. Some relief with nitroglycerin by EMS. Also has had a headache.  Past Medical History  Diagnosis Date  . Spleen enlarged   . Asthma   . Tobacco abuse   . Thrombocytopenia    Past Surgical History  Procedure Laterality Date  . Hernia repair     Family History  Problem Relation Age of Onset  . Diabetes Mother   . Hypertension Mother   . COPD Mother    History  Substance Use Topics  . Smoking status: Current Every Day Smoker -- 0.50 packs/day    Types: Cigarettes  . Smokeless tobacco: Not on file  . Alcohol Use: No    Review of Systems  Constitutional: Negative for activity change and appetite change.  Eyes: Negative for pain.  Respiratory: Positive for cough and shortness of breath. Negative for chest tightness.   Cardiovascular: Positive for chest pain. Negative for leg swelling.  Gastrointestinal: Negative for nausea, vomiting, abdominal pain and diarrhea.  Genitourinary: Negative for flank pain.  Musculoskeletal: Negative for back pain and neck stiffness.  Skin: Negative for rash.  Neurological: Negative for  weakness, numbness and headaches.  Psychiatric/Behavioral: Negative for behavioral problems.      Allergies  Review of patient's allergies indicates no known allergies.  Home Medications   Prior to Admission medications   Medication Sig Start Date End Date Taking? Authorizing Provider  HYDROcodone-homatropine (HYCODAN) 5-1.5 MG/5ML syrup Take 5 mLs by mouth every 6 (six) hours as needed. Patient not taking: Reported on 02/12/2015 10/07/14   Alvina Chou, PA-C  ondansetron (ZOFRAN ODT) 4 MG disintegrating tablet Take 1 tablet (4 mg total) by mouth every 8 (eight) hours as needed for nausea or vomiting. Patient not taking: Reported on 02/12/2015 10/07/14   Alvina Chou, PA-C  promethazine (PHENERGAN) 25 MG tablet Take 1 tablet (25 mg total) by mouth every 6 (six) hours as needed for nausea or vomiting. Patient not taking: Reported on 02/12/2015 12/03/13   Ashley Murrain, NP  traMADol (ULTRAM) 50 MG tablet Take 1 tablet (50 mg total) by mouth every 6 (six) hours as needed. Patient not taking: Reported on 02/12/2015 10/07/14   Alvina Chou, PA-C  trimethoprim-polymyxin b (POLYTRIM) ophthalmic solution Place 1 drop into the left eye every 4 (four) hours. Patient not taking: Reported on 02/12/2015 10/07/14   Kaitlyn Szekalski, PA-C   BP 125/81 mmHg  Pulse 86  Temp(Src) 97.4 F (36.3 C) (Oral)  Resp 18  Ht 6\' 3"  (1.905 m)  Wt 225 lb (102.059 kg)  BMI 28.12 kg/m2  SpO2 99% Physical Exam  Constitutional: He is oriented to person, place, and time. He  appears well-developed and well-nourished.  HENT:  Head: Normocephalic and atraumatic.  Eyes: Pupils are equal, round, and reactive to light.  Neck: Normal range of motion. No JVD present.  Cardiovascular: Normal rate, regular rhythm and normal heart sounds.   No murmur heard. Pulmonary/Chest: Effort normal and breath sounds normal. He has no rales. He exhibits tenderness.  Mild tenderness over lower sternum.  Abdominal: Soft.  Bowel sounds are normal. He exhibits no distension. There is no tenderness.  Musculoskeletal: Normal range of motion. He exhibits no edema.  Neurological: He is alert and oriented to person, place, and time. No cranial nerve deficit.  Skin: Skin is warm and dry.  Psychiatric: He has a normal mood and affect.  Nursing note and vitals reviewed.   ED Course  Procedures (including critical care time) Labs Review Labs Reviewed  CBC - Abnormal; Notable for the following:    Platelets 122 (*)    All other components within normal limits  BASIC METABOLIC PANEL - Abnormal; Notable for the following:    Glucose, Bld 100 (*)    GFR calc non Af Amer 84 (*)    All other components within normal limits  RESPIRATORY VIRUS PANEL  CLOSTRIDIUM DIFFICILE BY PCR  D-DIMER, QUANTITATIVE  HIV ANTIBODY (ROUTINE TESTING)  URINE RAPID DRUG SCREEN (HOSP PERFORMED)  INFLUENZA PANEL BY PCR (TYPE A & B, H1N1)  GI PATHOGEN PANEL BY PCR, STOOL  HEMOGLOBIN A1C  LIPID PANEL  TROPONIN I  TROPONIN I  TROPONIN I  I-STAT TROPOININ, ED    Imaging Review Dg Chest 2 View  02/12/2015   CLINICAL DATA:  Chest pain since 9 a.m. today.  EXAM: CHEST  2 VIEW  COMPARISON:  October 07, 2014  FINDINGS: The heart size and mediastinal contours are within normal limits. There is mild increased pulmonary interstitium bilaterally. There is no focal pneumonia or pleural effusion. The visualized skeletal structures are stable.  IMPRESSION: Mild increased pulmonary interstitium bilaterally, this can be seen in bronchitis.   Electronically Signed   By: Abelardo Diesel M.D.   On: 02/12/2015 19:19     EKG Interpretation   Date/Time:  Monday February 12 2015 19:17:42 EDT Ventricular Rate:  81 PR Interval:  149 QRS Duration: 75 QT Interval:  339 QTC Calculation: 393 R Axis:   64 Text Interpretation:  Sinus rhythm Probable left atrial enlargement RSR'  in V1 or V2, probably normal variant ST elev, probable normal early repol  pattern  Confirmed by Alvino Chapel  MD, Nobie Alleyne [phone]) on 02/12/2015 9:17:28 PM      MDM   Final diagnoses:  Chest pain, unspecified chest pain type    Patient with chest pain. Retrosternal and going up the chest. EKG overall reassuring. Enzymes negative. Will admit to internal medicine.    Davonna Belling, MD 02/13/15 [phone]

## 2015-02-12 NOTE — H&P (Signed)
[facility] History and Physical  [name] IAX:[id number] DOB: 28-Dec-1970 DOA: 02/12/2015  Referring physician: ED physician PCP: Juanell Fairly, MD  Specialists:   Chief Complaint: Chest pain, dry cough, shortness of breath, nausea, vomiting and diarrhea  HPI: Alan Sanders is a 44 y.o. male with past medical history of asthma, spleen enlargement, tobacco abuse, who presents with chest pain, dry cough, shortness of breath, nausea, vomiting and diarrhea.  Patient reports that his chest pain started in this morning. It has been persistent. It is located substernal area, moderate, radiating to the left chest. It is aggravated by walking. It is not pleuritic. It is not aggravated by deep breath. Patient does not have fever, but has chills and sweating. He has dry cough and mild shortness of breath.  He also reports nausea, vomiting and diarrhea. He reports that he noticed small amount of blood in his stools yesterday. He does not have abdominal pain. Patient denies dysuria, urgency, frequency, hematuria, skin rashes or leg swelling. No unilateral weakness, numbness or tingling sensations. No vision change or hearing loss.  In ED, patient was found to have negative troponin, WBC 7.6, temperature normal, no tachycardia, electrolytes okay. Chest x-ray showed interstitial changes suggesting bronchitis. D-dimer is negative. EKG showed T-wave inversion in V2 and aVL only. Patient is admitted to inpatient for further evaluation and treatment.  Review of Systems: As presented in the history of presenting illness, rest negative.  Where does patient live?  At home Can patient participate in ADLs? Yes  Allergy: No Known Allergies  Past Medical History  Diagnosis Date  . Spleen enlarged   . Asthma   . Tobacco abuse   . Thrombocytopenia     Past Surgical History  Procedure Laterality Date  . Hernia repair      Social History:  reports that he has been smoking Cigarettes.  He has  been smoking about 0.50 packs per day. He does not have any smokeless tobacco history on file. He reports that he does not drink alcohol or use illicit drugs.  Family History:  Family History  Problem Relation Age of Onset  . Diabetes Mother   . Hypertension Mother   . COPD Mother      Prior to Admission medications   Medication Sig Start Date End Date Taking? Authorizing Provider  HYDROcodone-homatropine (HYCODAN) 5-1.5 MG/5ML syrup Take 5 mLs by mouth every 6 (six) hours as needed. Patient not taking: Reported on 02/12/2015 10/07/14   Alvina Chou, PA-C  ondansetron (ZOFRAN ODT) 4 MG disintegrating tablet Take 1 tablet (4 mg total) by mouth every 8 (eight) hours as needed for nausea or vomiting. Patient not taking: Reported on 02/12/2015 10/07/14   Alvina Chou, PA-C  promethazine (PHENERGAN) 25 MG tablet Take 1 tablet (25 mg total) by mouth every 6 (six) hours as needed for nausea or vomiting. Patient not taking: Reported on 02/12/2015 12/03/13   Ashley Murrain, NP  traMADol (ULTRAM) 50 MG tablet Take 1 tablet (50 mg total) by mouth every 6 (six) hours as needed. Patient not taking: Reported on 02/12/2015 10/07/14   Alvina Chou, PA-C  trimethoprim-polymyxin b (POLYTRIM) ophthalmic solution Place 1 drop into the left eye every 4 (four) hours. Patient not taking: Reported on 02/12/2015 10/07/14   Alvina Chou, PA-C    Physical Exam: Filed Vitals:   02/12/15 2230 02/12/15 2245 02/12/15 2300 02/13/15 0000  BP: 135/119 134/81 127/82 125/81  Pulse: 97 81 94 86  Temp:    97.4  F (36.3 C)  TempSrc:    Oral  Resp: 22 20 19 18   Height:    6\' 3"  (1.905 m)  Weight:    102.059 kg (225 lb)  SpO2: 97% 90% 97% 99%   General: Not in acute distress HEENT:       Eyes: PERRL, EOMI, no scleral icterus       ENT: No discharge from the ears and nose, no pharynx injection, no tonsillar enlargement.        Neck: No JVD, no bruit, no mass felt. Cardiac: S1/S2, RRR, No murmurs, No  gallops or rubs Pulm: has rhonchi over left lower field posteriorly Abd: Soft, nondistended, nontender, no rebound pain, no organomegaly, BS present Ext: No edema bilaterally. 2+DP/PT pulse bilaterally Musculoskeletal: No joint deformities, erythema, or stiffness, ROM full Skin: No rashes.  Neuro: Alert and oriented X3, cranial nerves II-XII grossly intact, muscle strength 5/5 in all extremeties, sensation to light touch intact.  Psych: Patient is not psychotic, no suicidal or hemocidal ideation.  Labs on Admission:  Basic Metabolic Panel:  Recent Labs Lab 02/12/15 1800  NA 136  K 3.9  CL 103  CO2 23  GLUCOSE 100*  BUN 6  CREATININE 1.06  CALCIUM 9.8   Liver Function Tests: No results for input(s): AST, ALT, ALKPHOS, BILITOT, PROT, ALBUMIN in the last 168 hours. No results for input(s): LIPASE, AMYLASE in the last 168 hours. No results for input(s): AMMONIA in the last 168 hours. CBC:  Recent Labs Lab 02/12/15 1800  WBC 7.6  HGB 16.7  HCT 46.6  MCV 81.9  PLT 122*   Cardiac Enzymes: No results for input(s): CKTOTAL, CKMB, CKMBINDEX, TROPONINI in the last 168 hours.  BNP (last 3 results) No results for input(s): BNP in the last 8760 hours.  ProBNP (last 3 results) No results for input(s): PROBNP in the last 8760 hours.  CBG: No results for input(s): GLUCAP in the last 168 hours.  Radiological Exams on Admission: Dg Chest 2 View  02/12/2015   CLINICAL DATA:  Chest pain since 9 a.m. today.  EXAM: CHEST  2 VIEW  COMPARISON:  October 07, 2014  FINDINGS: The heart size and mediastinal contours are within normal limits. There is mild increased pulmonary interstitium bilaterally. There is no focal pneumonia or pleural effusion. The visualized skeletal structures are stable.  IMPRESSION: Mild increased pulmonary interstitium bilaterally, this can be seen in bronchitis.   Electronically Signed   By: Abelardo Diesel M.D.   On: 02/12/2015 19:19    EKG: Independently  reviewed. T-wave inversion in V2 and aVL only.   Assessment/Plan Principal Problem:   Chest pain Active Problems:   Asthma   Tobacco abuse   Nausea vomiting and diarrhea   Cough   Thrombocytopenia  Chest pain: Patient has atypical chest pain. D-dimer is negative, ruling out pulmonary embolism. Other differential diagnoses include drug abuse, acid reflux. Will admit patient for chest pain rule out.  -will admit to tele bed for observation - cycle CE q6 x3 and repeat her EKG in the am  - prn Nitroglycerin, Morphine, and aspirin, lipitor  - Risk factor stratification: will check FLP and A1C  - 2d echo - check UDS and HIV ab - Mylanta prn  Cough: Chest x-ray showed possible bronchitis. Patient has a dry cough and shortness of breath, which are also consistent with bronchitis. Patient has history of asthma, but notes on medications at home. His asthma is not obviously exacerbated now. - Z-pack  -  DuoNeb Nebulizer prn for SOB and Mucinex for cough - Follow up respiratory virus panel, plus Flu pcr  Nausea vomiting diarrhea: Likely due to viral infection, but patient had an episode of small amount of bloody stool. -will check C. difficile PCR, GI pathogen panel and FOBT -Zofran for Nausea -IVF: ns 100cc/h  Thrombocytopenia and spleen enlargement: This is chronic issue. Likely related to his spleen enlargement. Preseason number is stable 123 on 10/07/14-->122 today. Patient has not been followed up with hematologist. -follow up by CBC -may give referral to hematologist as outpatient follow-up  Tobacco abuse: -Did counseling about importance of quitting smoking -Nicotine patch   DVT ppx: SQ Heparin     Code Status: Full code Family Communication:  Yes, patient's  wife     at bed side Disposition Plan: Admit to inpatient   Date of Service 02/13/2015    Ivor Costa [facility] Pager [phone]  If 7PM-7AM, please contact night-coverage [email or web address] Password  [facility] 02/13/2015, 12:51 AM

## 2015-02-12 NOTE — ED Notes (Signed)
Per EMS- pt c/o centralized chest pain radiating to left arm beginning this morning at 9am, also SOB, N/V associated.  Pt reports no cardiac or medical hx.  Pt received 324 ASA and 3.4 NTG in route without any relief.  BP-150/98 P-120 R-24 O2-96 RA CBG-99.  Pt a x 4, NAD.

## 2015-02-13 ENCOUNTER — Inpatient Hospital Stay (HOSPITAL_COMMUNITY): Payer: Self-pay

## 2015-02-13 ENCOUNTER — Encounter (HOSPITAL_COMMUNITY): Payer: Self-pay | Admitting: Internal Medicine

## 2015-02-13 DIAGNOSIS — R05 Cough: Secondary | ICD-10-CM

## 2015-02-13 DIAGNOSIS — D696 Thrombocytopenia, unspecified: Secondary | ICD-10-CM | POA: Diagnosis present

## 2015-02-13 DIAGNOSIS — R059 Cough, unspecified: Secondary | ICD-10-CM | POA: Diagnosis present

## 2015-02-13 LAB — INFLUENZA PANEL BY PCR (TYPE A & B)
H1N1FLUPCR: NOT DETECTED
INFLAPCR: NEGATIVE
Influenza B By PCR: NEGATIVE

## 2015-02-13 LAB — RAPID URINE DRUG SCREEN, HOSP PERFORMED
AMPHETAMINES: NOT DETECTED
BENZODIAZEPINES: NOT DETECTED
Barbiturates: NOT DETECTED
COCAINE: NOT DETECTED
OPIATES: NOT DETECTED
TETRAHYDROCANNABINOL: NOT DETECTED

## 2015-02-13 LAB — TROPONIN I: Troponin I: <0.03 ng/mL (ref <0.031)

## 2015-02-13 LAB — LIPID PANEL
CHOL/HDL RATIO: 6.2 ratio
CHOLESTEROL: 185 mg/dL (ref 0–200)
HDL: 30 mg/dL — ABNORMAL LOW (ref >39)
LDL CALC: 126 mg/dL — AB (ref 0–99)
Triglycerides: 146 mg/dL (ref <150)
VLDL: 29 mg/dL (ref 0–40)

## 2015-02-13 LAB — HIV ANTIBODY (ROUTINE TESTING W REFLEX): HIV SCREEN 4TH GENERATION: NONREACTIVE

## 2015-02-13 LAB — HEPATIC FUNCTION PANEL
ALT: 70 U/L — ABNORMAL HIGH (ref 0–53)
AST: 43 U/L — ABNORMAL HIGH (ref 0–37)
Albumin: 4.3 g/dL (ref 3.5–5.2)
Alkaline Phosphatase: 75 U/L (ref 39–117)
Bilirubin, Direct: 0.2 mg/dL (ref 0.0–0.5)
Indirect Bilirubin: 1 mg/dL — ABNORMAL HIGH (ref 0.3–0.9)
Total Bilirubin: 1.2 mg/dL (ref 0.3–1.2)
Total Protein: 7 g/dL (ref 6.0–8.3)

## 2015-02-13 IMAGING — US US ABDOMEN LIMITED
1 series · 8 of 8 positions shown · non-contrast
Comparison: None.

CLINICAL DATA: Abdominal distension

EXAM:
LIMITED ABDOMEN ULTRASOUND FOR ASCITES
TECHNIQUE: Limited ultrasound survey for ascites was performed in all four
abdominal quadrants.

[Series 1: us abdomen limited · 0.31mm/px · 8 of 8 slices shown]
[im 1/8]
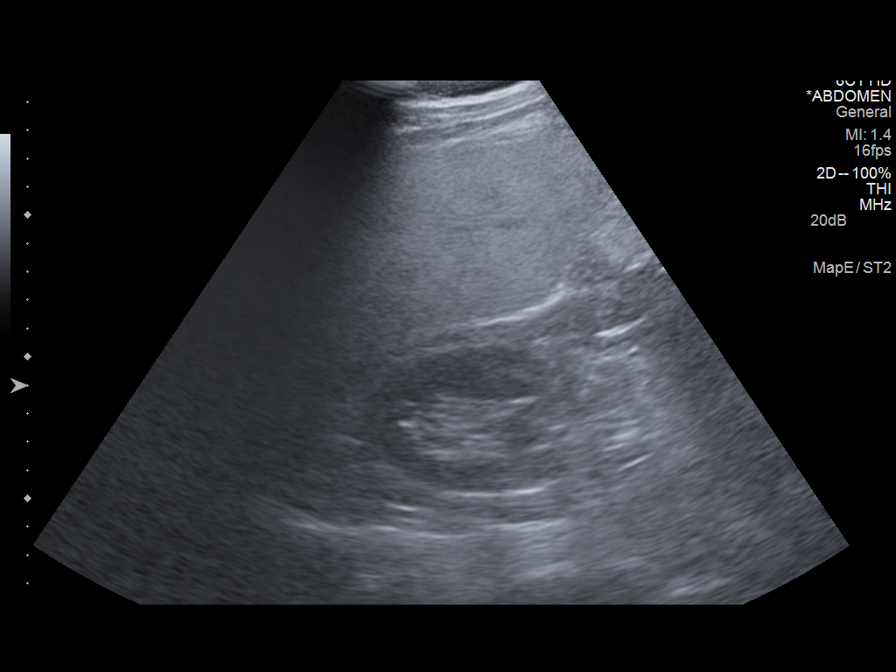
[im 2/8]
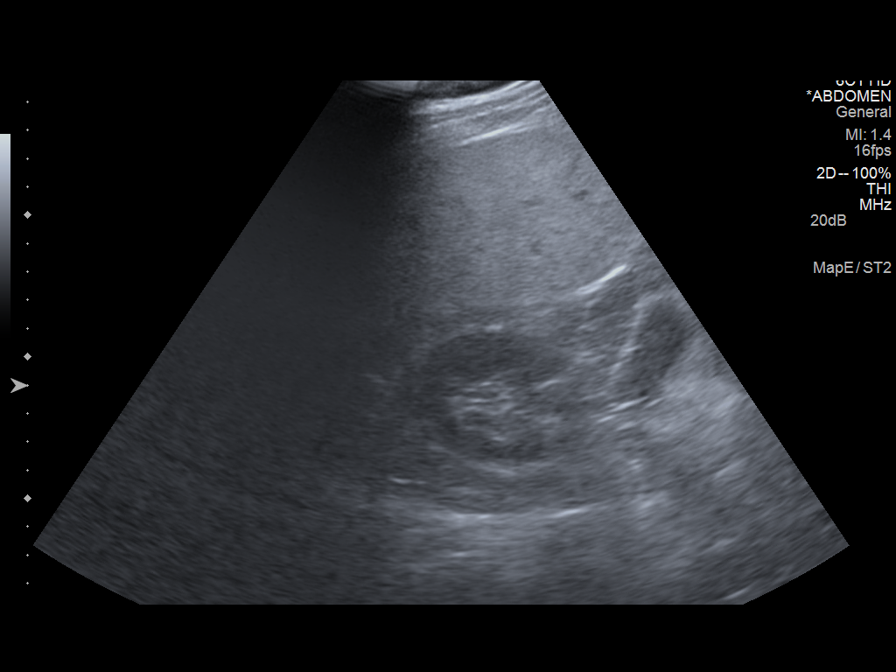
[im 3/8]
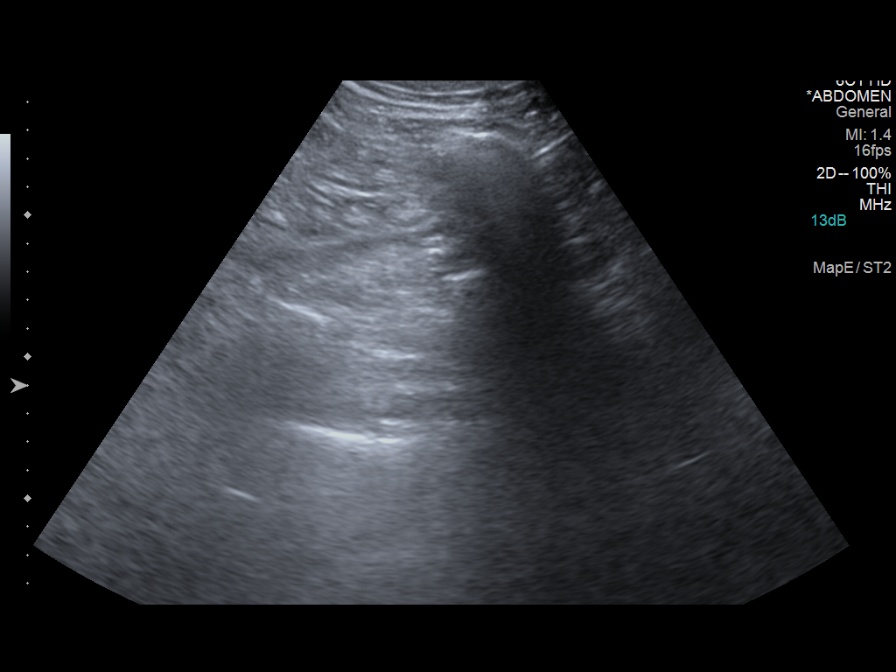
[im 4/8]
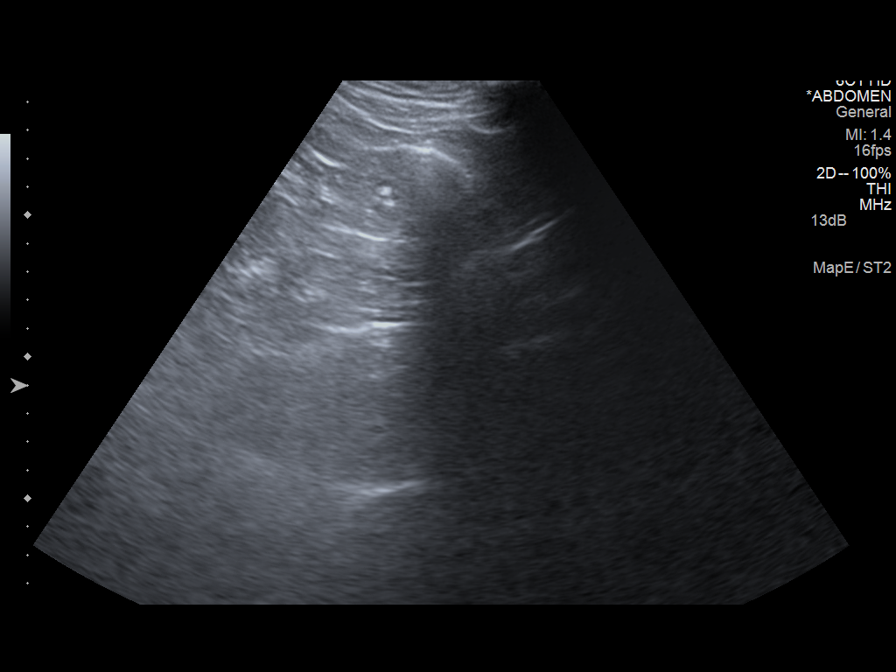
[im 5/8]
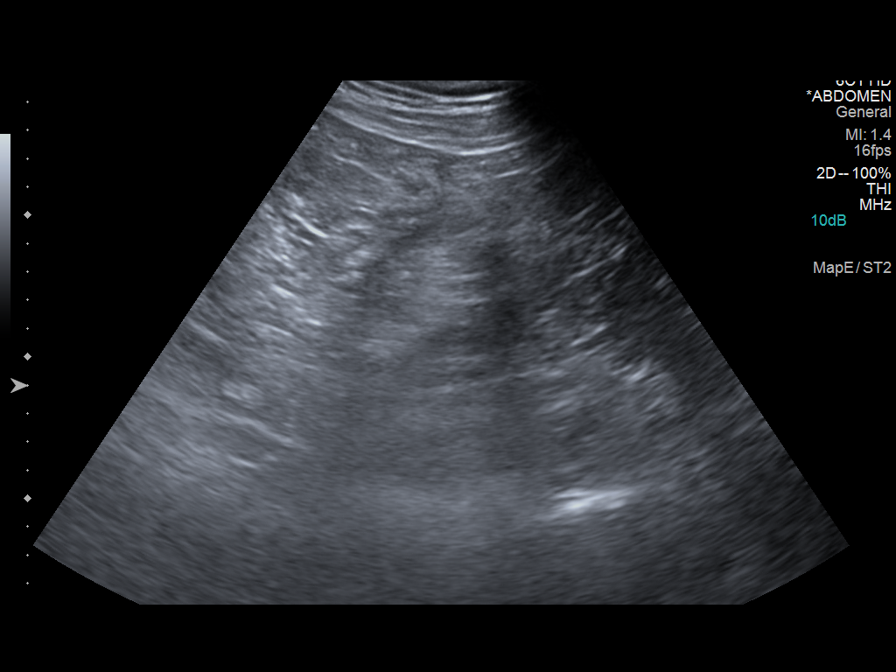
[im 6/8]
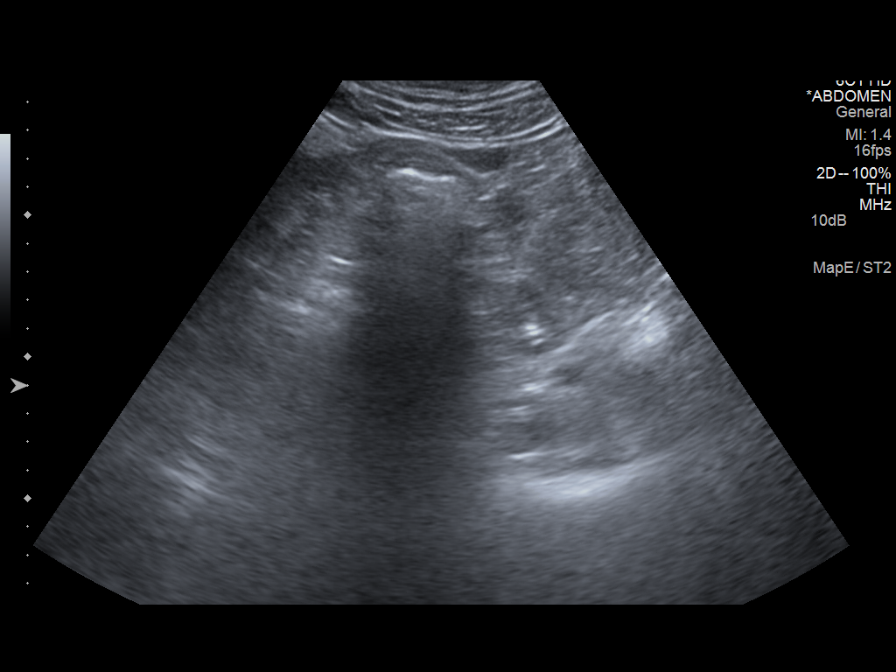
[im 7/8]
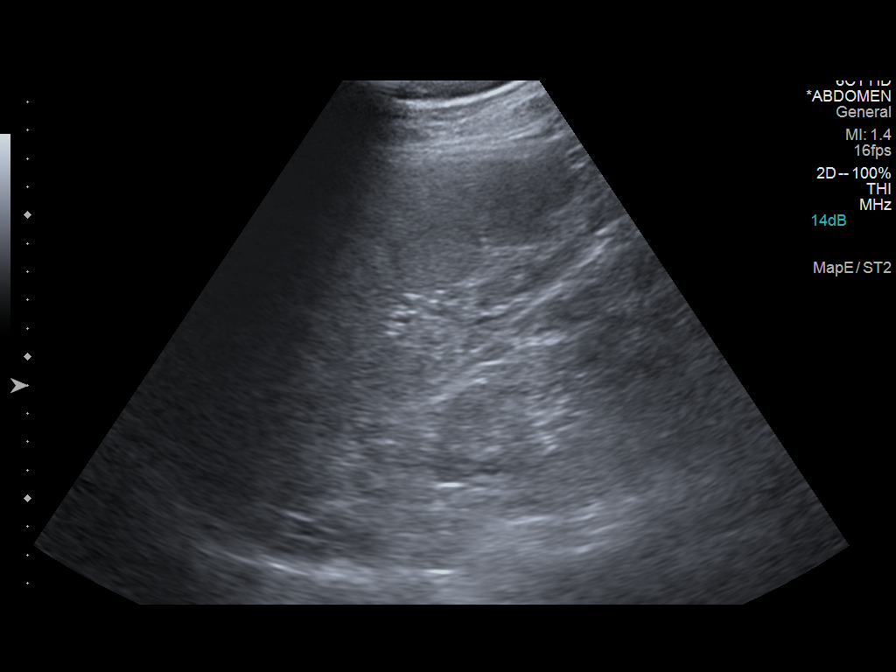
[im 8/8]
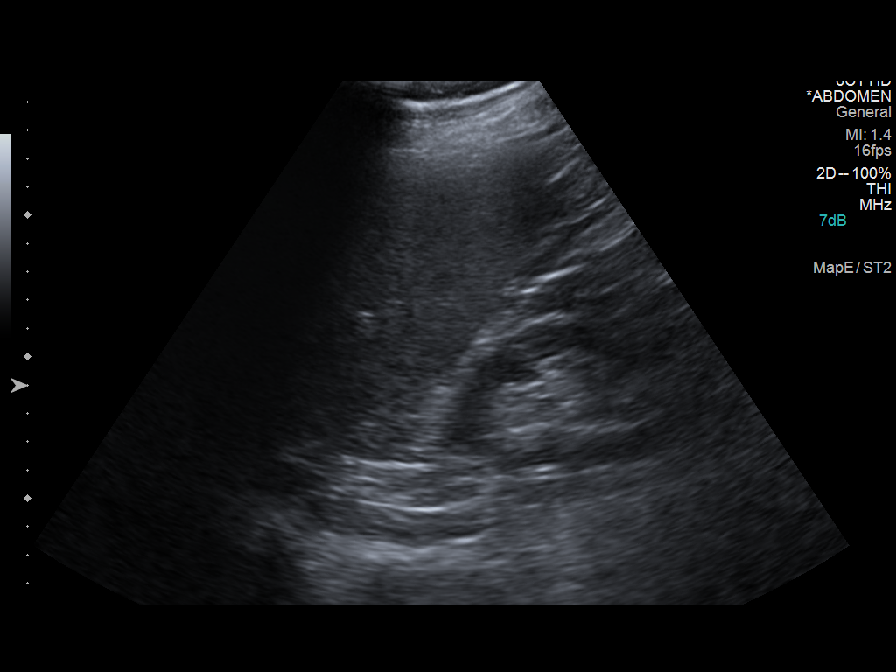

[8 of 8 positions shown; findings below may reference images not displayed]

FINDINGS: No ascites is identified.
IMPRESSION: No evidence of ascites.

## 2015-02-13 MED ORDER — PNEUMOCOCCAL VAC POLYVALENT 25 MCG/0.5ML IJ INJ
0.5000 mL | INJECTION | INTRAMUSCULAR | Status: AC
  Administered 2015-02-14: 0.5 mL via INTRAMUSCULAR
  Filled 2015-02-13: qty 0.5

## 2015-02-13 MED ORDER — INFLUENZA VAC SPLIT QUAD 0.5 ML IM SUSY
0.5000 mL | PREFILLED_SYRINGE | INTRAMUSCULAR | Status: AC
  Administered 2015-02-14: 0.5 mL via INTRAMUSCULAR
  Filled 2015-02-13: qty 0.5

## 2015-02-13 NOTE — Care Management Note (Addendum)
    Page 1 of 1   02/14/2015     12:11:50 PM CARE MANAGEMENT NOTE 02/14/2015  Patient:  Alan Sanders, Alan Sanders   Account Number:  [id number]  Date Initiated:  02/13/2015  Documentation initiated by:  GRAVES-BIGELOW,Stacy Deshler  Subjective/Objective Assessment:   Pt admitted for cp, cough and CXR suggestive of Bronchitis. Pt works in [employer] in [employer]. Pt is without PCP/ Insurance.     Action/Plan:   CM did speak to pt in ref to medications. Pt uses [facility] in [place]. Pt will need to use all generic medications once d/c. Lipitor is generic, however still expensive. May benefit from cheaper statin.   Anticipated DC Date:  02/14/2015   Anticipated DC Plan:  [place]  CM consult      Choice offered to / List presented to:             Status of service:  Completed, signed off Medicare Important Message given?  NO (If response is "NO", the following Medicare IM given date fields will be blank) Date Medicare IM given:   Medicare IM given by:   Date Additional Medicare IM given:   Additional Medicare IM given by:    Discharge Disposition:  HOME/SELF CARE  Per UR Regulation:  Reviewed for med. necessity/level of care/duration of stay  If discussed at [place] of Stay Meetings, dates discussed:    Comments:  02-14-15 [place], RN,BSN [phone] CM did speak with pt in regards to PCP and medications. CM will not be able to assist with pain medications at d/c. Other meds should be generic. Pt states his wife works as well. Hopefully she will be able to assist with medications. Per pt his father will pick him up today. CM did mention the [facility] for medications. CM did provide pt with the [facility] information and pt to call for appointment. Pt lives in [place]. [facility] may be closer. Information provided on the AVS form. No further needs from CM at this time.   02-13-15 CM will discuss with pt in regards to [facility] in [place], [place], BSN [phone]

## 2015-02-13 NOTE — Consult Note (Addendum)
CARDIOLOGY CONSULT NOTE   Patient ID: Alan Sanders MRN: [id number], DOB/AGE: [birth year]   Admit date: 02/12/2015 Date of Consult: 02/13/2015   Primary Physician: Juanell Fairly, MD Primary Cardiologist: None  Pt. Profile  44 year old gentleman without prior known heart disease admitted with chest pain and shortness of breath and cough associated with diarrhea and nausea and vomiting.  Problem List  Past Medical History  Diagnosis Date  . Spleen enlarged   . Asthma   . Tobacco abuse   . Thrombocytopenia   . GERD (gastroesophageal reflux disease)   . Seizures     " its been along time ,since my last seizure "  . Arthritis     " in my back "    Past Surgical History  Procedure Laterality Date  . Hernia repair       Allergies  No Known Allergies  HPI   This 44 year old gentleman has no prior history of known heart problems.  He was in his usual state of health until this weekend.  On Sunday he began having diarrhea.  Yesterday he began having cough chills possible fever and nausea and vomiting.  He also complained of substernal chest discomfort. The patient smokes a pack of cigarettes a day.  He does not have any history of premature coronary artery disease in the family.  He has a mildly elevated cholesterol on admission today.  He does not get any regular aerobic physical exercise.  He works as a Presenter, broadcasting. Since admission his EKGs show no ischemic changes.  His initial troponin is normal his LDL cholesterol is 126.  Inpatient Medications  . aspirin  325 mg Oral Daily  . atorvastatin  20 mg Oral q1800  . azithromycin  250 mg Oral Daily  . dextromethorphan-guaiFENesin  1 tablet Oral BID  . heparin  5,000 Units Subcutaneous 3 times per day  . [START ON 02/14/2015] Influenza vac split quadrivalent PF  0.5 mL Intramuscular Tomorrow-1000  . nicotine  21 mg Transdermal Q24H  . [START ON 02/14/2015] pneumococcal 23 valent vaccine  0.5 mL Intramuscular Tomorrow-1000   . sodium chloride  3 mL Intravenous Q12H    Family History Family History  Problem Relation Age of Onset  . Diabetes Mother   . Hypertension Mother   . COPD Mother      Social History History   Social History  . Marital Status: Married    Spouse Name: N/A  . Number of Children: N/A  . Years of Education: N/A   Occupational History  . Not on file.   Social History Main Topics  . Smoking status: Current Every Day Smoker -- 0.50 packs/day for 24 years    Types: Cigarettes  . Smokeless tobacco: Never Used  . Alcohol Use: No  . Drug Use: No  . Sexual Activity: Not on file   Other Topics Concern  . Not on file   Social History Narrative     Review of Systems  General:  Positive for chills and nonproductive cough Cardiovascular:  No chest pain, dyspnea on exertion, edema, orthopnea, palpitations, paroxysmal nocturnal dyspnea. Dermatological: No rash, lesions/masses Respiratory: No cough, dyspnea Urologic: No hematuria, dysuria Abdominal:   No nausea, vomiting, diarrhea, bright red blood per rectum, melena, or hematemesis Neurologic:  No visual changes, wkns, changes in mental status. All other systems reviewed and are otherwise negative except as noted above.  Physical Exam  Blood pressure 112/76, pulse 74, temperature 98.1 F (36.7 C), temperature source Oral,  resp. rate 18, height 6\' 3"  (1.905 m), weight 225 lb (102.059 kg), SpO2 98 %.  General: Pleasant, NAD Psych: Normal affect. Neuro: Alert and oriented X 3. Moves all extremities spontaneously. HEENT: Normal  Neck: Supple without bruits or JVD. Lungs:  Resp regular and unlabored, CTA. Heart: RRR no s3, s4, or murmurs.  The patient is tender to palpation over his substernal area. Abdomen: Soft, non-tender, non-distended, BS + x 4.  Extremities: No clubbing, cyanosis or edema. DP/PT/Radials 2+ and equal bilaterally.  Labs   Recent Labs  02/13/15 0237  TROPONINI <0.03   Lab Results  Component Value  Date   WBC 7.6 02/12/2015   HGB 16.7 02/12/2015   HCT 46.6 02/12/2015   MCV 81.9 02/12/2015   PLT 122* 02/12/2015     Recent Labs Lab 02/12/15 1800  NA 136  K 3.9  CL 103  CO2 23  BUN 6  CREATININE 1.06  CALCIUM 9.8  GLUCOSE 100*   Lab Results  Component Value Date   CHOL 185 02/13/2015   HDL 30* 02/13/2015   LDLCALC 126* 02/13/2015   TRIG 146 02/13/2015   Lab Results  Component Value Date   DDIMER <0.27 02/12/2015    Radiology/Studies  Dg Chest 2 View  02/12/2015   CLINICAL DATA:  Chest pain since 9 a.m. today.  EXAM: CHEST  2 VIEW  COMPARISON:  October 07, 2014  FINDINGS: The heart size and mediastinal contours are within normal limits. There is mild increased pulmonary interstitium bilaterally. There is no focal pneumonia or pleural effusion. The visualized skeletal structures are stable.  IMPRESSION: Mild increased pulmonary interstitium bilaterally, this can be seen in bronchitis.   Electronically Signed   By: Abelardo Diesel M.D.   On: 02/12/2015 19:19    ECG  Sinus bradycardia.  No ischemic changes.  Personally reviewed  ASSESSMENT AND PLAN  1.  Atypical chest pain, suspect musculoskeletal secondary to probable viral illness with nausea vomiting diarrhea and nonproductive cough and sensation of chills and feeling hot. 2.  Tobacco abuse 3.  History of splenomegaly and chronic thrombocytopenia 4.  Mild hypercholesterolemia  Recommendation: We will get an echocardiogram today.  If echo is essentially normal, no further ischemic workup indicated.  I suspect that his chest discomfort is musculoskeletal.  Continue supportive treatment for his probable viral illness. Counseled on avoiding cigarettes.  His mother has oxygen dependent COPD    Signed, Darlin Coco, MD  02/13/2015, 8:56 AM

## 2015-02-13 NOTE — Progress Notes (Signed)
UR Completed Anahita Cua Graves-Bigelow, RN,BSN [phone]  

## 2015-02-13 NOTE — Progress Notes (Signed)
PROGRESS NOTE  Alan Sanders FSE:[id number] DOB: 12-16-1970 DOA: 02/12/2015 PCP: Juanell Fairly, MD  HPI: Alan Sanders is a 44 year old male with a past medical history of asthma, tobacco abuse, GERD, enlarged spleen, and thrombocytopenia who presented to the [facility] on 02/12/15 with chest pain, cough, SOB, nausea and vomiting. Patient's chest pain began yesterday morning and worsened throughout the day until it prompted patient to call EMS. Pain is located substernal with some radiation to the left chest and was not alleviated with nitroglycerin. Patient also reports nausea, vomiting, and diarrhea with some hematochezia on _0 0 mg Oral Daily 02/12/15 2322 02/13/15 0008      Objective: Filed Vitals:   02/12/15 2245 02/12/15 2300 02/13/15 0000 02/13/15 0400  BP: 134/81 127/82 125/81 112/76  Pulse: 81 94 86 74  Temp:   97.4 F (36.3 C) 98.1 F (36.7 C)  TempSrc:   Oral Oral  Resp:  _0 Height:   _1  (1.905 m)   Weight:   102.059 kg (225 lb) 102.059 kg (225 lb)  SpO2: 90% 97% 99% 98%   No intake or output data in the 24 hours ending 02/13/15 1028 Filed Weights   02/13/15 0000 02/13/15 0400  Weight: 102.059 kg (225 lb) 102.059 kg (225 lb)    Exam: General: Well developed, well nourished, patient lying in bed in NAD, appears stated age  25:  Anicteic  Sclera Neck: Supple Cardiovascular: RRR, S1 S2 auscultated, no rubs, murmurs or gallops.    Respiratory: Clear to auscultation bilaterally with equal chest rise- no rales, rhonchi, or wheezes  Abdomen: Soft, nontender, distended with full flanks and everted umbilicus, + bowel sounds  Extremities: warm dry without cyanosis clubbing or edema.  Neuro: AAOx3. Strength 5/5 in upper and lower extremities  Skin: Without rashes exudates or nodules.   Psych: Normal affect and demeanor with intact judgement and insight   Data Reviewed: Basic Metabolic Panel:  Recent Labs Lab 02/12/15 1800  NA 136  K 3.9  CL 103  CO2 23  GLUCOSE 100*  BUN 6  CREATININE 1.06  CALCIUM 9.8   CBC:  Recent Labs Lab 02/12/15 1800  WBC 7.6  HGB 16.7  HCT 46.6  MCV 81.9  PLT 122*   Cardiac Enzymes:  Recent Labs Lab 02/13/15 0237 02/13/15 0735  TROPONINI <0.03 <0.03   Studies: Dg Chest 2 View  02/12/2015   CLINICAL DATA:  Chest pain since 9 a.m. today.  EXAM: CHEST  2 VIEW  COMPARISON:  October 07, 2014  FINDINGS: The heart size and mediastinal contours are within normal limits. There is mild increased pulmonary interstitium bilaterally. There is no focal pneumonia or pleural effusion. The visualized skeletal structures are stable.  IMPRESSION: Mild increased pulmonary interstitium bilaterally, this can be seen in bronchitis.   Electronically Signed   By: Abelardo Diesel M.D.   On: 02/12/2015 19:19    Scheduled Meds: . aspirin  325 mg Oral Daily  . atorvastatin  20 mg Oral q1800  . azithromycin  250 mg Oral Daily  . dextromethorphan-guaiFENesin  1 tablet Oral BID  . heparin  5,000 Units Subcutaneous 3 times per day  . [START ON 02/14/2015] Influenza vac split quadrivalent PF  0.5 mL Intramuscular Tomorrow-1000  . nicotine  21 mg Transdermal Q24H  . [START ON 02/14/2015] pneumococcal 23 valent vaccine  0.5 mL Intramuscular Tomorrow-1000  . sodium chloride  3 mL Intravenous Q12H   Continuous  Infusions: . sodium chloride 100 mL/hr at 02/13/15 1006    Principal Problem:   Chest pain Active Problems:   Asthma   Tobacco abuse   Nausea vomiting and diarrhea   Cough   Thrombocytopenia    Raspect, Erin, PA-S  [facility] 02/13/2015, 10:28 AM    Addendum  Patient seen and examined, chart and data base reviewed.  I agree with the above assessment and plan.  For full details please see Mrs. Raspect, Erin, PA-S note.  I reviewed and amended the above note as appropriate.   Birdie Hopes, MD [facility] Pager: [phone] 02/13/2015, 2:04 PM

## 2015-02-13 NOTE — Progress Notes (Signed)
UR Completed Tieasha Larsen Graves-Bigelow, RN,BSN [phone]  

## 2015-02-14 DIAGNOSIS — R112 Nausea with vomiting, unspecified: Secondary | ICD-10-CM

## 2015-02-14 DIAGNOSIS — R079 Chest pain, unspecified: Secondary | ICD-10-CM

## 2015-02-14 DIAGNOSIS — J4520 Mild intermittent asthma, uncomplicated: Secondary | ICD-10-CM

## 2015-02-14 DIAGNOSIS — R197 Diarrhea, unspecified: Secondary | ICD-10-CM

## 2015-02-14 DIAGNOSIS — D696 Thrombocytopenia, unspecified: Secondary | ICD-10-CM

## 2015-02-14 LAB — HEPATITIS PANEL, ACUTE
HCV Ab: NEGATIVE
Hep A IgM: NONREACTIVE
Hep B C IgM: NONREACTIVE
Hepatitis B Surface Ag: NEGATIVE

## 2015-02-14 LAB — HEMOGLOBIN A1C
Hgb A1c MFr Bld: 5.2 % (ref 4.8–5.6)
Mean Plasma Glucose: 103 mg/dL

## 2015-02-14 MED ORDER — PANTOPRAZOLE SODIUM 40 MG PO TBEC: 40.0000 mg | DELAYED_RELEASE_TABLET | Freq: Every day | ORAL | Status: DC

## 2015-02-14 MED ORDER — HYDROCODONE-ACETAMINOPHEN 5-325 MG PO TABS
1.0000 | ORAL_TABLET | Freq: Four times a day (QID) | ORAL | Status: DC | PRN
  Administered 2015-02-14: 1 via ORAL
  Filled 2015-02-14: qty 1

## 2015-02-14 MED ORDER — HYDROCODONE-ACETAMINOPHEN 5-325 MG PO TABS: 1.0000 | ORAL_TABLET | Freq: Four times a day (QID) | ORAL | Status: DC | PRN

## 2015-02-14 MED ORDER — DM-GUAIFENESIN ER 30-600 MG PO TB12: 1.0000 | ORAL_TABLET | Freq: Two times a day (BID) | ORAL | Status: DC

## 2015-02-14 MED ORDER — ALBUTEROL SULFATE HFA 108 (90 BASE) MCG/ACT IN AERS: 2.0000 | INHALATION_SPRAY | Freq: Four times a day (QID) | RESPIRATORY_TRACT | Status: DC | PRN

## 2015-02-14 MED ORDER — PANTOPRAZOLE SODIUM 40 MG PO TBEC
40.0000 mg | DELAYED_RELEASE_TABLET | Freq: Every day | ORAL | Status: DC
  Administered 2015-02-14: 40 mg via ORAL
  Filled 2015-02-14: qty 1

## 2015-02-14 MED ORDER — AZITHROMYCIN 250 MG PO TABS: 250.0000 mg | ORAL_TABLET | Freq: Every day | ORAL | Status: DC

## 2015-02-14 NOTE — Discharge Instructions (Signed)
Acute Bronchitis Bronchitis is when the airways that extend from the windpipe into the lungs get red, puffy, and painful (inflamed). Bronchitis often causes thick spit (mucus) to develop. This leads to a cough. A cough is the most common symptom of bronchitis. In acute bronchitis, the condition usually begins suddenly and goes away over time (usually in 2 weeks). Smoking, allergies, and asthma can make bronchitis worse. Repeated episodes of bronchitis may cause more lung problems. HOME CARE  Rest.  Drink enough fluids to keep your pee (urine) clear or pale yellow (unless you need to limit fluids as told by your doctor).  Only take over-the-counter or prescription medicines as told by your doctor.  Avoid smoking and secondhand smoke. These can make bronchitis worse. If you are a smoker, think about using nicotine gum or skin patches. Quitting smoking will help your lungs heal faster.  Reduce the chance of getting bronchitis again by:  Washing your hands often.  Avoiding people with cold symptoms.  Trying not to touch your hands to your mouth, nose, or eyes.  Follow up with your doctor as told. GET HELP IF: Your symptoms do not improve after 1 week of treatment. Symptoms include:  Cough.  Fever.  Coughing up thick spit.  Body aches.  Chest congestion.  Chills.  Shortness of breath.  Sore throat. GET HELP RIGHT AWAY IF:   You have an increased fever.  You have chills.  You have severe shortness of breath.  You have bloody thick spit (sputum).  You throw up (vomit) often.  You lose too much body fluid (dehydration).  You have a severe headache.  You faint. MAKE SURE YOU:   Understand these instructions.  Will watch your condition.  Will get help right away if you are not doing well or get worse. Document Released: 04/28/2008 Document Revised: 07/13/2013 Document Reviewed: 05/03/2013 [facility] Patient Information 2015 [place], [place]. This information is not  intended to replace advice given to you by your health care provider. Make sure you discuss any questions you have with your health care provider.   Antibiotic Medication Antibiotic medicine helps fight germs. Germs cause infections. This type of medicine will not work for colds, flu, or other viral infections. Tell your doctor if you:  Are allergic to any medicines.  Are pregnant or are trying to get pregnant.  Are taking other medicines.  Have other medical problems. HOME CARE  Take your medicine with a glass of water or food as told by your doctor.  Take the medicine as told. Finish them even if you start to feel better.  Do not give your medicine to other people.  Do not use your medicine in the future for a different infection.  Ask your doctor about which side effects to watch for.  Try not to miss any doses. If you miss a dose, take it as soon as possible. If it is almost time for your next dose, and your dosing schedule is:  Two doses a day, take the missed dose and the next dose 5 to 6 hours later.  Three or more doses a day, take the missed dose and the next dose 2 to 4 hours later, or double your next dose.  Then go back to your normal schedule. GET HELP RIGHT AWAY IF:   You get worse or do not get better within a few days.  The medicine makes you sick.  You develop a rash or any other side effects.  You have questions or  concerns. MAKE SURE YOU:  Understand these instructions.  Will watch your condition.  Will get help right away if you are not doing well or get worse. Document Released: 08/19/2008 Document Revised: 02/02/2012 Document Reviewed: 10/16/2009 [facility] Patient Information 2015 [place], [place]. This information is not intended to replace advice given to you by your health care provider. Make sure you discuss any questions you have with your health care provider.   Cardiac Diet This diet can help prevent heart disease and stroke. Many  factors influence your heart health, including eating and exercise habits. Coronary risk rises a lot with abnormal blood fat (lipid) levels. Cardiac meal planning includes limiting unhealthy fats, increasing healthy fats, and making other small dietary changes. General guidelines are as follows:  Adjust calorie intake to reach and maintain desirable body weight.  Limit total fat intake to less than 30% of total calories. Saturated fat should be less than 7% of calories.  Saturated fats are found in animal products and in some vegetable products. Saturated vegetable fats are found in coconut oil, cocoa butter, palm oil, and palm kernel oil. Read labels carefully to avoid these products as much as possible. Use butter in moderation. Choose tub margarines and oils that have 2 grams of fat or less. Good cooking oils are canola and olive oils.  Practice low-fat cooking techniques. Do not fry food. Instead, broil, bake, boil, steam, grill, roast on a rack, stir-fry, or microwave it. Other fat reducing suggestions include:  Remove the skin from poultry.  Remove all visible fat from meats.  Skim the fat off stews, soups, and gravies before serving them.  Steam vegetables in water or broth instead of sauting them in fat.  Avoid foods with trans fat (or hydrogenated oils), such as commercially fried foods and commercially baked goods. Commercial shortening and deep-frying fats will contain trans fat.  Increase intake of fruits, vegetables, whole grains, and legumes to replace foods high in fat.  Increase consumption of nuts, legumes, and seeds to at least 4 servings weekly. One serving of a legume equals  cup, and 1 serving of nuts or seeds equals  cup.  Choose whole grains more often. Have 3 servings per day (a serving is 1 ounce [oz]).  Eat 4 to 5 servings of vegetables per day. A serving of vegetables is 1 cup of raw leafy vegetables;  cup of raw or cooked cut-up vegetables;  cup of vegetable  juice.  Eat 4 to 5 servings of fruit per day. A serving of fruit is 1 medium whole fruit;  cup of dried fruit;  cup of fresh, frozen, or canned fruit;  cup of 100% fruit juice.  Increase your intake of dietary fiber to 20 to 30 grams per day. Insoluble fiber may help lower your risk of heart disease and may help curb your appetite.  Soluble fiber binds cholesterol to be removed from the blood. Foods high in soluble fiber are dried beans, citrus fruits, oats, apples, bananas, broccoli, Brussels sprouts, and eggplant.  Try to include foods fortified with plant sterols or stanols, such as yogurt, breads, juices, or margarines. Choose several fortified foods to achieve a daily intake of 2 to 3 grams of plant sterols or stanols.  Foods with omega-3 fats can help reduce your risk of heart disease. Aim to have a 3.5 oz portion of fatty fish twice per week, such as salmon, mackerel, albacore tuna, sardines, lake trout, or herring. If you wish to take a fish oil supplement, choose  one that contains 1 gram of both DHA and EPA.  Limit processed meats to 2 servings (3 oz portion) weekly.  Limit the sodium in your diet to 1500 milligrams (mg) per day. If you have high blood pressure, talk to a registered dietitian about a DASH (Dietary Approaches to Stop Hypertension) eating plan.  Limit sweets and beverages with added sugar, such as soda, to no more than 5 servings per week. One serving is:   1 tablespoon sugar.  1 tablespoon jelly or jam.   cup sorbet.  1 cup lemonade.   cup regular soda. CHOOSING FOODS Starches  Allowed: Breads: All kinds (wheat, rye, raisin, white, oatmeal, [place], [place], and English muffin bread). Low-fat rolls: English muffins, frankfurter and hamburger buns, bagels, pita bread, tortillas (not fried). Pancakes, waffles, biscuits, and muffins made with recommended oil.  Avoid: Products made with saturated or trans fats, oils, or whole milk products. Butter rolls,  cheese breads, croissants. Commercial doughnuts, muffins, sweet rolls, biscuits, waffles, pancakes, store-bought mixes. Crackers  Allowed: Low-fat crackers and snacks: Animal, graham, rye, saltine (with recommended oil, no lard), oyster, and matzo crackers. Bread sticks, melba toast, rusks, flatbread, pretzels, and light popcorn.  Avoid: High-fat crackers: cheese crackers, butter crackers, and those made with coconut, palm oil, or trans fat (hydrogenated oils). Buttered popcorn. Cereals  Allowed: Hot or cold whole-grain cereals.  Avoid: Cereals containing coconut, hydrogenated vegetable fat, or animal fat. Potatoes / Pasta / Rice  Allowed: All kinds of potatoes, rice, and pasta (such as macaroni, spaghetti, and noodles).  Avoid: Pasta or rice prepared with cream sauce or high-fat cheese. Chow mein noodles, [place] fries. Vegetables  Allowed: All vegetables and vegetable juices.  Avoid: Fried vegetables. Vegetables in cream, butter, or high-fat cheese sauces. Limit coconut. Fruit in cream or custard. Protein  Allowed: Limit your intake of meat, seafood, and poultry to no more than 6 oz (cooked weight) per day. All lean, well-trimmed beef, veal, pork, and lamb. All chicken and [place] without skin. All fish and shellfish. Wild game: wild duck, rabbit, pheasant, and venison. Egg whites or low-cholesterol egg substitutes may be used as desired. Meatless dishes: recipes with dried beans, peas, lentils, and tofu (soybean curd). Seeds and nuts: all seeds and most nuts.  Avoid: Prime grade and other heavily marbled and fatty meats, such as short ribs, spare ribs, rib eye roast or steak, frankfurters, sausage, bacon, and high-fat luncheon meats, mutton. Caviar. Commercially fried fish. Domestic duck, goose, venison sausage. Organ meats: liver, gizzard, heart, chitterlings, brains, kidney, sweetbreads. Dairy  Allowed: Low-fat cheeses: nonfat or low-fat cottage cheese (1% or 2% fat), cheeses made with  part skim milk, such as mozzarella, farmers, string, or ricotta. (Cheeses should be labeled no more than 2 to 6 grams fat per oz.). Skim (or 1%) milk: liquid, powdered, or evaporated. Buttermilk made with low-fat milk. Drinks made with skim or low-fat milk or cocoa. Chocolate milk or cocoa made with skim or low-fat (1%) milk. Nonfat or low-fat yogurt.  Avoid: Whole milk cheeses, including colby, cheddar, [place], Monterey Jack, [place], [place], [place], American, Swiss, and blue. Creamed cottage cheese, cream cheese. Whole milk and whole milk products, including buttermilk or yogurt made from whole milk, drinks made from whole milk. Condensed milk, evaporated whole milk, and 2% milk. Soups and Combination Foods  Allowed: Low-fat low-sodium soups: broth, dehydrated soups, homemade broth, soups with the fat removed, homemade cream soups made with skim or low-fat milk. Low-fat spaghetti, lasagna, chili, and Spanish rice if low-fat  ingredients and low-fat cooking techniques are used.  Avoid: Cream soups made with whole milk, cream, or high-fat cheese. All other soups. Desserts and Sweets  Allowed: Sherbet, fruit ices, gelatins, meringues, and angel food cake. Homemade desserts with recommended fats, oils, and milk products. Jam, jelly, honey, marmalade, sugars, and syrups. Pure sugar candy, such as gum drops, hard candy, jelly beans, marshmallows, mints, and small amounts of dark chocolate.  Avoid: Commercially prepared cakes, pies, cookies, frosting, pudding, or mixes for these products. Desserts containing whole milk products, chocolate, coconut, lard, palm oil, or palm kernel oil. Ice cream or ice cream drinks. Candy that contains chocolate, coconut, butter, hydrogenated fat, or unknown ingredients. Buttered syrups. Fats and Oils  Allowed: Vegetable oils: safflower, sunflower, corn, soybean, cottonseed, sesame, canola, olive, or peanut. Non-hydrogenated margarines. Salad dressing or mayonnaise:  homemade or commercial, made with a recommended oil. Low or nonfat salad dressing or mayonnaise.  Limit added fats and oils to 6 to 8 tsp per day (includes fats used in cooking, baking, salads, and spreads on bread). Remember to count the "hidden fats" in foods.  Avoid: Solid fats and shortenings: butter, lard, salt pork, bacon drippings. Gravy containing meat fat, shortening, or suet. Cocoa butter, coconut. Coconut oil, palm oil, palm kernel oil, or hydrogenated oils: these ingredients are often used in bakery products, nondairy creamers, whipped toppings, candy, and commercially fried foods. Read labels carefully. Salad dressings made of unknown oils, sour cream, or cheese, such as blue cheese and Roquefort. Cream, all kinds: half-and-half, light, heavy, or whipping. Sour cream or cream cheese (even if "light" or low-fat). Nondairy cream substitutes: coffee creamers and sour cream substitutes made with palm, palm kernel, hydrogenated oils, or coconut oil. Beverages  Allowed: Coffee (regular or decaffeinated), tea. Diet carbonated beverages, mineral water. Alcohol: Check with your caregiver. Moderation is recommended.  Avoid: Whole milk, regular sodas, and juice drinks with added sugar. Condiments  Allowed: All seasonings and condiments. Cocoa powder. "Cream" sauces made with recommended ingredients.  Avoid: Carob powder made with hydrogenated fats. SAMPLE MENU Breakfast   cup orange juice   cup oatmeal  1 slice toast  1 tsp margarine  1 cup skim milk Lunch  [place] sandwich with 2 oz [place], 2 slices bread  Lettuce and tomato slices  Fresh fruit  Carrot sticks  Coffee or tea Snack  Fresh fruit or low-fat crackers Dinner  3 oz lean ground beef  1 baked potato  1 tsp margarine   cup asparagus  Lettuce salad  1 tbs non-creamy dressing   cup peach slices  1 cup skim milk Document Released: 08/19/2008 Document Revised: 05/11/2012 Document Reviewed:  01/10/2014 [employer] Patient Information 2015 [place], [place]. This information is not intended to replace advice given to you by your health care provider. Make sure you discuss any questions you have with your health care provider.

## 2015-02-14 NOTE — Progress Notes (Signed)
  Echocardiogram 2D Echocardiogram has been performed.  Stylianos Stradling FRANCES 02/14/2015, 12:12 PM

## 2015-02-14 NOTE — Progress Notes (Signed)
Subjective: Had some CP after eating.  Felt like something was stuck  Objective: Vital signs in last 24 hours: Temp:  [97.7 F (36.5 C)-99.3 F (37.4 C)] 97.7 F (36.5 C) (03/23 0356) Pulse Rate:  [51-75] 51 (03/23 0356) Resp:  [18] 18 (03/22 1748) BP: (91-127)/(54-83) 91/54 mmHg (03/23 0356) SpO2:  [95 %-100 %] 95 % (03/23 0356) Weight:  [227 lb 14.4 oz (103.375 kg)] 227 lb 14.4 oz (103.375 kg) (03/23 0356) Last BM Date: 02/11/15  Intake/Output from previous day: 03/22 0701 - 03/23 0700 In: 3850 [P.O.:1200; I.V.:2650] Out: -  Intake/Output this shift:    Medications Current Facility-Administered Medications  Medication Dose Route Frequency Provider Last Rate Last Dose  . 0.9 %  sodium chloride infusion   Intravenous Continuous Ivor Costa, MD 100 mL/hr at 02/14/15 [phone]    . acetaminophen (TYLENOL) tablet 650 mg  650 mg Oral Q6H PRN Ivor Costa, MD   650 mg at 02/13/15 2105   Or  . acetaminophen (TYLENOL) suppository 650 mg  650 mg Rectal Q6H PRN Ivor Costa, MD      . alum & mag hydroxide-simeth (MAALOX/MYLANTA) 200-200-20 MG/5ML suspension 30 mL  30 mL Oral Q6H PRN Ivor Costa, MD   30 mL at 02/13/15 0058  . aspirin tablet 325 mg  325 mg Oral Daily Ivor Costa, MD   325 mg at 02/13/15 1006  . atorvastatin (LIPITOR) tablet 20 mg  20 mg Oral q1800 Ivor Costa, MD      . azithromycin [facility]) tablet 250 mg  250 mg Oral Daily Ivor Costa, MD   250 mg at 02/13/15 1006  . dextromethorphan-guaiFENesin (MUCINEX DM) 30-600 MG per 12 hr tablet 1 tablet  1 tablet Oral BID Ivor Costa, MD   1 tablet at 02/13/15 2105  . heparin injection 5,000 Units  5,000 Units Subcutaneous 3 times per day Ivor Costa, MD   5,000 Units at 02/14/15 [phone]  . Influenza vac split quadrivalent PF (FLUARIX) injection 0.5 mL  0.5 mL Intramuscular Tomorrow-1000 Mutaz Elmahi, MD      . ipratropium-albuterol (DUONEB) 0.5-2.5 (3) MG/3ML nebulizer solution 3 mL  3 mL Nebulization Q4H PRN Ivor Costa, MD      . morphine 2 MG/ML  injection 2 mg  2 mg Intravenous Q4H PRN Ivor Costa, MD   2 mg at 02/13/15 2300  . nicotine (NICODERM CQ - dosed in mg/24 hours) patch 21 mg  21 mg Transdermal Q24H Ivor Costa, MD   21 mg at 02/13/15 2136  . nitroGLYCERIN (NITROSTAT) SL tablet 0.4 mg  0.4 mg Sublingual Q5 min PRN Ivor Costa, MD      . ondansetron [facility]) tablet 4 mg  4 mg Oral Q6H PRN Ivor Costa, MD       Or  . ondansetron [facility]) injection 4 mg  4 mg Intravenous Q6H PRN Ivor Costa, MD   4 mg at 02/13/15 1007  . pneumococcal 23 valent vaccine (PNU-IMMUNE) injection 0.5 mL  0.5 mL Intramuscular Tomorrow-1000 Mutaz Elmahi, MD      . sodium chloride 0.9 % injection 3 mL  3 mL Intravenous Q12H Ivor Costa, MD   3 mL at 02/13/15 1006    PE: General appearance: alert, cooperative and no distress Lungs: clear to auscultation bilaterally Heart: regular rate and rhythm, S1, S2 normal, no murmur, click, rub or gallop Abdomen: +BS, nontender, distended Extremities: No LEE Pulses: 2+ and symmetric Skin: Warm and dry Neurologic: Grossly normal  Lab Results:   Recent  Labs  02/12/15 1800  WBC 7.6  HGB 16.7  HCT 46.6  PLT 122*   BMET  Recent Labs  02/12/15 1800  NA 136  K 3.9  CL 103  CO2 23  GLUCOSE 100*  BUN 6  CREATININE 1.06  CALCIUM 9.8   PT/INR No results for input(s): LABPROT, INR in the last 72 hours. Cholesterol  Recent Labs  02/13/15 0237  CHOL 185   Lipid Panel     Component Value Date/Time   CHOL 185 02/13/2015 0237   TRIG 146 02/13/2015 0237   HDL 30* 02/13/2015 0237   CHOLHDL 6.2 02/13/2015 0237   VLDL 29 02/13/2015 0237   LDLCALC 126* 02/13/2015 0237    Cardiac Panel (last 3 results)  Recent Labs  02/13/15 0237 02/13/15 0735 02/13/15 1645  TROPONINI <0.03 <0.03 <0.03      Assessment/Plan 44 year old gentleman without prior known heart disease admitted with chest pain and shortness of breath and cough associated with diarrhea and nausea and vomiting.  Principal Problem:   Chest  pain Active Problems:   Asthma   Tobacco abuse   Nausea vomiting and diarrhea   Cough   Thrombocytopenia  Ruled out for MI.  Likely noncardiac CP.  Echo pending.  If normal, no other cardiac workup.    LOS: 2 days    HAGER, BRYAN PA-C 02/14/2015 9:18 AM  I have seen and examined the patient along with HAGER, BRYAN PA-C.  I have reviewed the chart, notes and new data.  I agree with PA's note.  Atypical chest pain with low risk ecg and biomarkers. Also, note absence of coronary calcium on CT chest 2014 Echo today, DC if normal.  PLAN: Outpatient plain treadmill ECG test is not unreasonable, non-urgent.  Sanda Klein, MD, [place] [phone] 02/14/2015, 10:55 AM

## 2015-02-14 NOTE — Discharge Summary (Addendum)
Physician Discharge Summary  Alan Sanders [id number] DOB: 09/19/1971 DOA: 02/12/2015  PCP: Juanell Fairly, MD  Admit date: 02/12/2015 Discharge date: 02/14/2015  Time spent: 25 minutes  Recommendations for Outpatient Follow-up:  1. *Follow-up hematology for thrombocytopenia 2. Follow-up cardiology in 2 weeks for outpatient stress test  Discharge Diagnoses:  Principal Problem:   Chest pain Active Problems:   Asthma   Tobacco abuse   Nausea vomiting and diarrhea   Cough   Thrombocytopenia   Discharge Condition: Stable  Diet recommendation: Low-salt diet  Filed Weights   02/13/15 0000 02/13/15 0400 02/14/15 0356  Weight: 102.059 kg (225 lb) 102.059 kg (225 lb) 103.375 kg (227 lb 14.4 oz)    History of present illness:  44 y.o. male with past medical history of asthma, spleen enlargement, tobacco abuse, who presents with chest pain, dry cough, shortness of breath, nausea, vomiting and diarrhea.  Patient reports that his chest pain started in this morning. It has been persistent. It is located substernal area, moderate, radiating to the left chest. It is aggravated by walking. It is not pleuritic. It is not aggravated by deep breath. Patient does not have fever, but has chills and sweating. He has dry cough and mild shortness of breath.  He also reports nausea, vomiting and diarrhea. He reports that he noticed small amount of blood in his stools yesterday. He does not have abdominal pain. Patient denies dysuria, urgency, frequency, hematuria, skin rashes or leg swelling. No unilateral weakness, numbness or tingling sensations. No vision change or hearing loss.  In ED, patient was found to have negative troponin, WBC 7.6, temperature normal, no tachycardia, electrolytes okay. Chest x-ray showed interstitial changes suggesting bronchitis. D-dimer is negative. EKG showed T-wave inversion in V2 and aVL only. Patient is admitted to inpatient for further evaluation and  treatment.  Hospital Course:   Chest pain- likely atypical, can't take enzymes 3 negative. 2-D echocardiogram only shows mild dilation of right ventricle. Called and discussed with cardiology Dr. Sallyanne Kuster, and he recommends to follow patient as outpatient for ECG treadmill stress test. Started the patient on Protonix 40 mg by mouth daily. As patient continues to have intermittent pain we'll send him on Vicodin 5/325 one tablet every 6 hours when necessary Will give 15 tablets only.  Bronchitis- chest x-ray showed possible bronchitis patient was started on Zithromax. We'll discharge the patient on Mucinex, Zithromax, albuterol inhaler 2 puffs when necessary every 6 hours.  Diarrhea-resolved prior to admission  Thrombocytopenia- patient came with a count of 122. A paced with baseline had a previous bone marrow biopsy in 2012 which showed normal findings. He'll follow up with hematology as outpatient.  Abdominal distention- patient came with abdominal distention and everted umbilicus. Abdominal ultrasound was done which showed no ascites.  Procedures:  Echocardiogram  Consultations:  Cardiology  Discharge Exam: Filed Vitals:   02/14/15 1501  BP: 119/64  Pulse: 58  Temp: 98.1 F (36.7 C)  Resp:     General: Appears in no acute distress Cardiovascular: S1-S2 regular Respiratory: Clear to auscultation bilaterally  Discharge Instructions   Discharge Instructions    Diet - low sodium heart healthy    Complete by:  As directed      Increase activity slowly    Complete by:  As directed           Current Discharge Medication List    START taking these medications   Details  albuterol (PROVENTIL HFA;VENTOLIN HFA) 108 (90 BASE) MCG/ACT inhaler Inhale  2 puffs into the lungs every 6 (six) hours as needed for wheezing or shortness of breath. Qty: 1 Inhaler, Refills: 2    azithromycin (ZITHROMAX) 250 MG tablet Take 1 tablet (250 mg total) by mouth daily. Qty: 3 each, Refills: 0     dextromethorphan-guaiFENesin (MUCINEX DM) 30-600 MG per 12 hr tablet Take 1 tablet by mouth 2 (two) times daily. Qty: 10 tablet, Refills: 0    HYDROcodone-acetaminophen (NORCO/VICODIN) 5-325 MG per tablet Take 1 tablet by mouth every 6 (six) hours as needed for moderate pain. Qty: 15 tablet, Refills: 0      CONTINUE these medications which have NOT CHANGED   Details  ondansetron (ZOFRAN ODT) 4 MG disintegrating tablet Take 1 tablet (4 mg total) by mouth every 8 (eight) hours as needed for nausea or vomiting. Qty: 10 tablet, Refills: 0    promethazine (PHENERGAN) 25 MG tablet Take 1 tablet (25 mg total) by mouth every 6 (six) hours as needed for nausea or vomiting. Qty: 30 tablet, Refills: 0    traMADol (ULTRAM) 50 MG tablet Take 1 tablet (50 mg total) by mouth every 6 (six) hours as needed. Qty: 15 tablet, Refills: 0    trimethoprim-polymyxin b (POLYTRIM) ophthalmic solution Place 1 drop into the left eye every 4 (four) hours. Qty: 10 mL, Refills: 0      STOP taking these medications     HYDROcodone-homatropine (HYCODAN) 5-1.5 MG/5ML syrup        No Known Allergies Follow-up Information    Follow up with [facility].   Contact information:    Address: [address] [place], [place] [address] [phone]      Follow up with Sanda Klein, MD. Schedule an appointment as soon as possible for a visit in 2 weeks.   Specialty:  Cardiology   Why:  For outpatient cardiac stress test   Contact information:   [address] [place] [address] [phone]       Follow up with Eilleen Kempf., MD.   Specialty:  Oncology   Why:  Call to make an appointment   Contact information:   [place] [phone]        The results of significant diagnostics from this hospitalization (including imaging, microbiology, ancillary and laboratory) are listed below for reference.    Significant Diagnostic Studies: Dg  Chest 2 View  02/12/2015   CLINICAL DATA:  Chest pain since 9 a.m. today.  EXAM: CHEST  2 VIEW  COMPARISON:  October 07, 2014  FINDINGS: The heart size and mediastinal contours are within normal limits. There is mild increased pulmonary interstitium bilaterally. There is no focal pneumonia or pleural effusion. The visualized skeletal structures are stable.  IMPRESSION: Mild increased pulmonary interstitium bilaterally, this can be seen in bronchitis.   Electronically Signed   By: Abelardo Diesel M.D.   On: 02/12/2015 19:19   US Abdomen Limited  02/13/2015   CLINICAL DATA:  Abdominal distension  EXAM: LIMITED ABDOMEN ULTRASOUND FOR ASCITES  TECHNIQUE: Limited ultrasound survey for ascites was performed in all four abdominal quadrants.  COMPARISON:  None.  FINDINGS: No ascites is identified.  IMPRESSION: No evidence of ascites.   Electronically Signed   By: Inez Catalina M.D.   On: 02/13/2015 15:15    Microbiology: No results found for this or any previous visit (from the past 240 hour(s)).   Labs: Basic Metabolic Panel:  Recent Labs Lab 02/12/15 1800  NA 136  K  3.9  CL 103  CO2 23  GLUCOSE 100*  BUN 6  CREATININE 1.06  CALCIUM 9.8   Liver Function Tests:  Recent Labs Lab 02/13/15 0805  AST 43*  ALT 70*  ALKPHOS 75  BILITOT 1.2  PROT 7.0  ALBUMIN 4.3   No results for input(s): LIPASE, AMYLASE in the last 168 hours. No results for input(s): AMMONIA in the last 168 hours. CBC:  Recent Labs Lab 02/12/15 1800  WBC 7.6  HGB 16.7  HCT 46.6  MCV 81.9  PLT 122*   Cardiac Enzymes:  Recent Labs Lab 02/13/15 0237 02/13/15 0735 02/13/15 1645  TROPONINI <0.03 <0.03 <0.03   BNP: BNP (last 3 results) No results for input(s): BNP in the last 8760 hours.  ProBNP (last 3 results) No results for input(s): PROBNP in the last 8760 hours.  CBG: No results for input(s): GLUCAP in the last 168 hours.     SignedEleonore Chiquito S  [facility] 02/14/2015, 3:26  PM

## 2015-09-29 ENCOUNTER — Encounter (HOSPITAL_BASED_OUTPATIENT_CLINIC_OR_DEPARTMENT_OTHER): Payer: Self-pay | Admitting: Emergency Medicine

## 2015-09-29 ENCOUNTER — Emergency Department (HOSPITAL_BASED_OUTPATIENT_CLINIC_OR_DEPARTMENT_OTHER)
Admission: EM | Admit: 2015-09-29 | Discharge: 2015-09-29 | Disposition: A | Discharge status: Home / Self Care | Payer: Self-pay | Attending: Physician Assistant | Admitting: Physician Assistant

## 2015-09-29 ENCOUNTER — Emergency Department (HOSPITAL_BASED_OUTPATIENT_CLINIC_OR_DEPARTMENT_OTHER): Payer: Self-pay

## 2015-09-29 DIAGNOSIS — Z862 Personal history of diseases of the blood and blood-forming organs and certain disorders involving the immune mechanism: Secondary | ICD-10-CM | POA: Insufficient documentation

## 2015-09-29 DIAGNOSIS — Y9289 Other specified places as the place of occurrence of the external cause: Secondary | ICD-10-CM | POA: Insufficient documentation

## 2015-09-29 DIAGNOSIS — M1990 Unspecified osteoarthritis, unspecified site: Secondary | ICD-10-CM | POA: Insufficient documentation

## 2015-09-29 DIAGNOSIS — Z79899 Other long term (current) drug therapy: Secondary | ICD-10-CM | POA: Insufficient documentation

## 2015-09-29 DIAGNOSIS — M25512 Pain in left shoulder: Secondary | ICD-10-CM

## 2015-09-29 DIAGNOSIS — J45909 Unspecified asthma, uncomplicated: Secondary | ICD-10-CM | POA: Insufficient documentation

## 2015-09-29 DIAGNOSIS — Y9389 Activity, other specified: Secondary | ICD-10-CM | POA: Insufficient documentation

## 2015-09-29 DIAGNOSIS — Z792 Long term (current) use of antibiotics: Secondary | ICD-10-CM | POA: Insufficient documentation

## 2015-09-29 DIAGNOSIS — Y998 Other external cause status: Secondary | ICD-10-CM | POA: Insufficient documentation

## 2015-09-29 DIAGNOSIS — S4992XA Unspecified injury of left shoulder and upper arm, initial encounter: Secondary | ICD-10-CM | POA: Insufficient documentation

## 2015-09-29 DIAGNOSIS — W01198A Fall on same level from slipping, tripping and stumbling with subsequent striking against other object, initial encounter: Secondary | ICD-10-CM | POA: Insufficient documentation

## 2015-09-29 DIAGNOSIS — K219 Gastro-esophageal reflux disease without esophagitis: Secondary | ICD-10-CM | POA: Insufficient documentation

## 2015-09-29 DIAGNOSIS — W19XXXA Unspecified fall, initial encounter: Secondary | ICD-10-CM

## 2015-09-29 DIAGNOSIS — Z720 Tobacco use: Secondary | ICD-10-CM | POA: Insufficient documentation

## 2015-09-29 IMAGING — DX DG SHOULDER 2+V*L*
3 series · 3 of 3 positions shown · non-contrast
Comparison: [facility].

CLINICAL DATA: 44-year-old male with history of trauma from a fall
earlier today complaining of left shoulder pain.

EXAM:
LEFT SHOULDER - 2+ VIEW

[shoulder grashey]
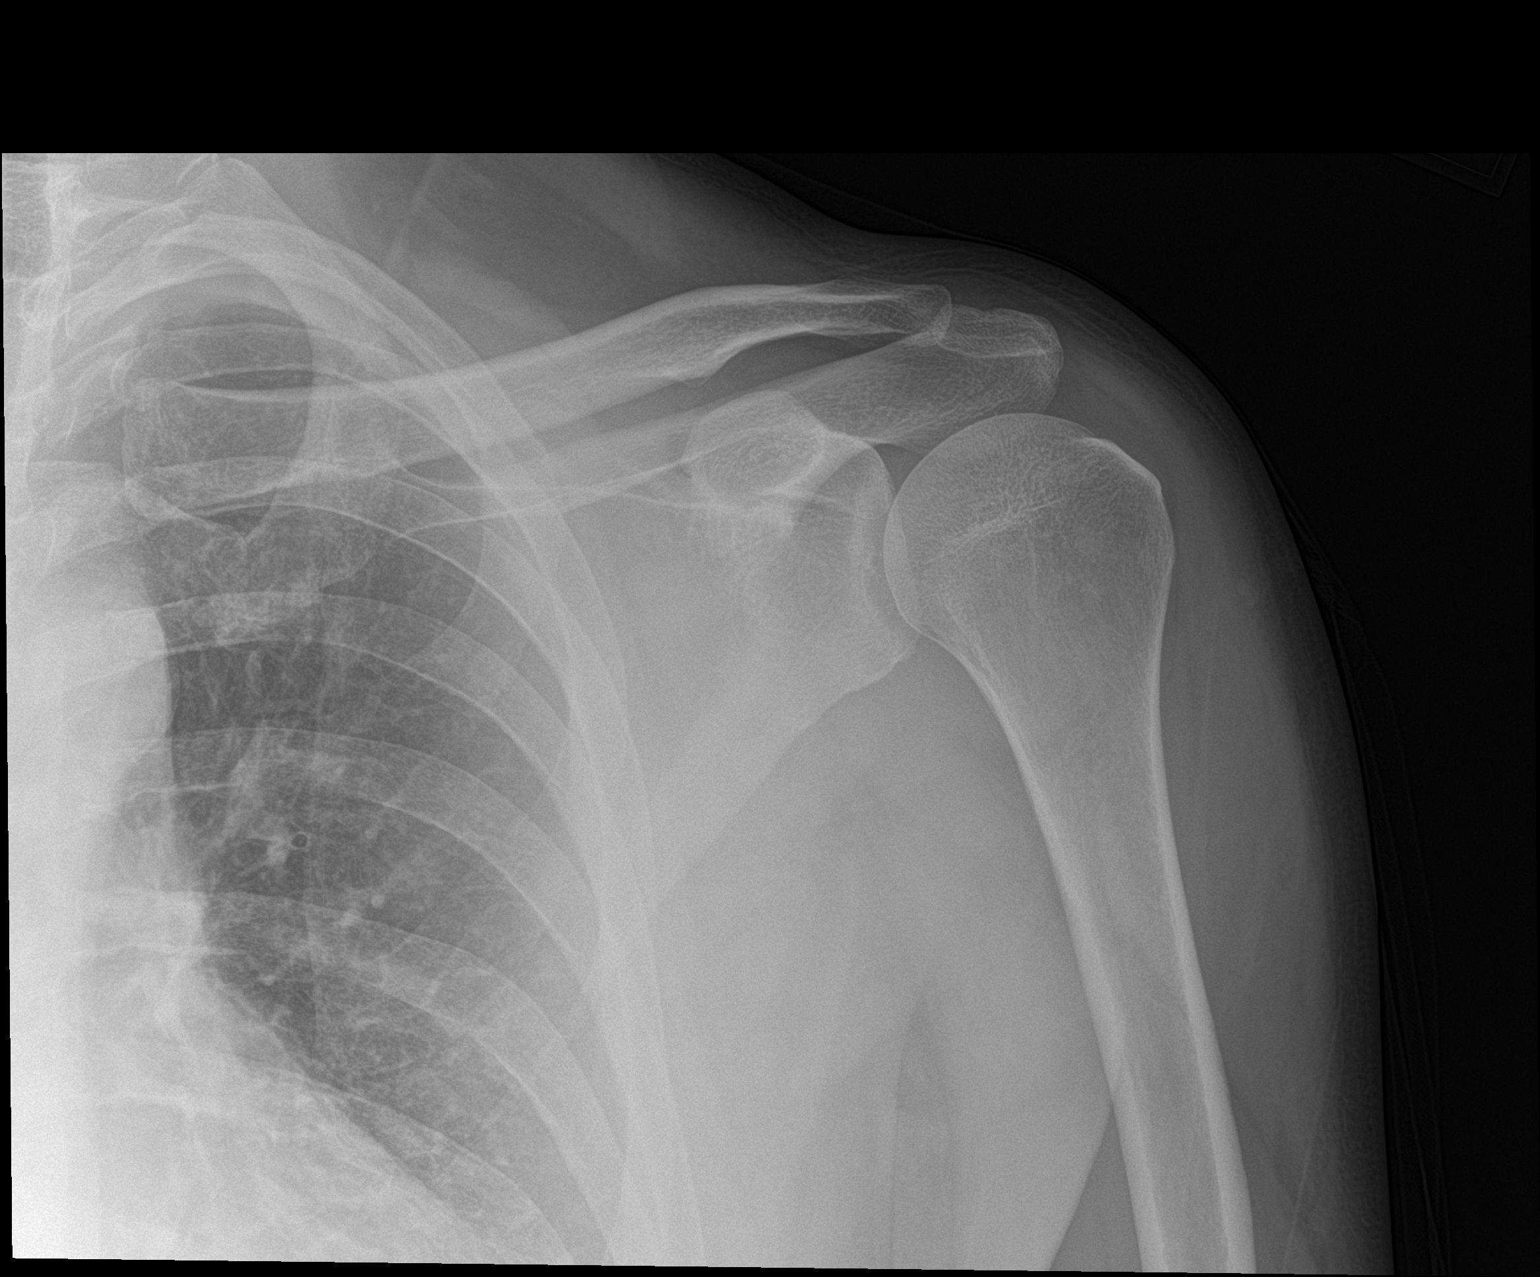

[shoulder y view]
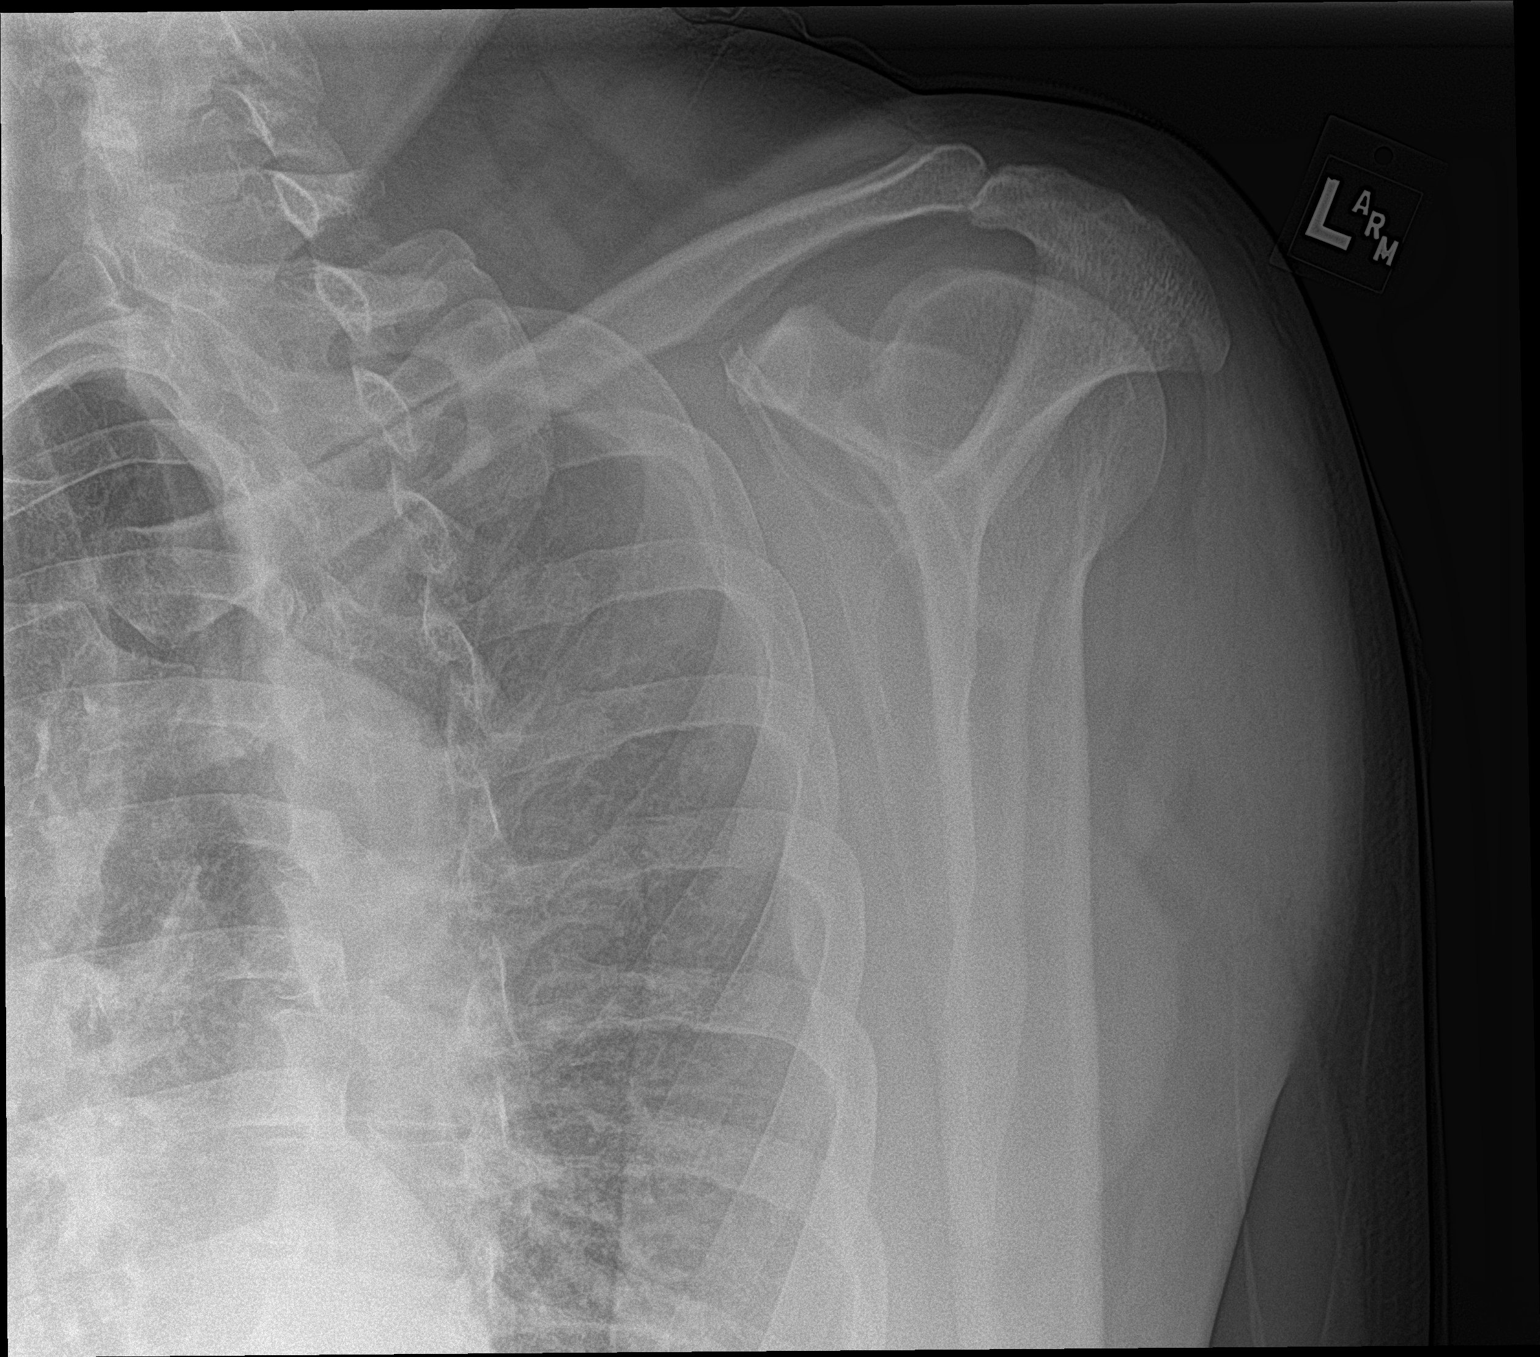

[shoulder axillary]
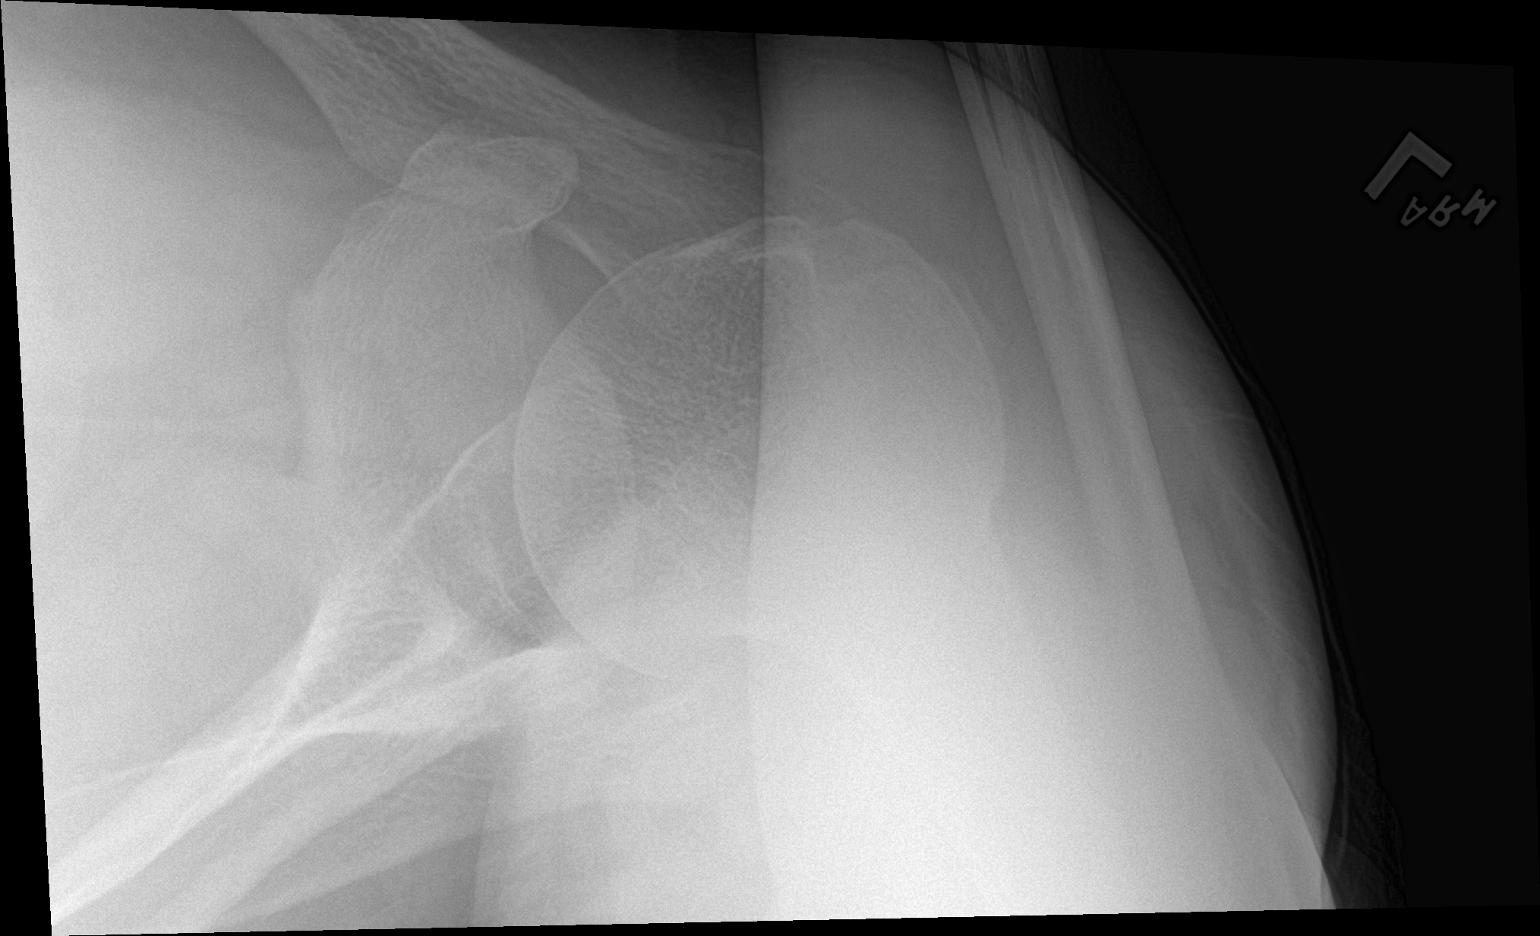

[3 of 3 positions shown; findings below may reference images not displayed]

FINDINGS: Multiple views of the left shoulder demonstrate no acute displaced
fracture, subluxation, dislocation, or soft tissue abnormality. Mild
pleural thickening in the apex of the left hemithorax, unchanged
compared to prior chest x-ray [DATE], presumably chronic post
infectious or inflammatory scarring.
IMPRESSION: No acute radiographic abnormality of the left shoulder.

## 2015-09-29 MED ORDER — IBUPROFEN 800 MG PO TABS: 800.0000 mg | ORAL_TABLET | Freq: Three times a day (TID) | ORAL | Status: DC

## 2015-09-29 MED ORDER — OXYCODONE-ACETAMINOPHEN 5-325 MG PO TABS
1.0000 | ORAL_TABLET | Freq: Once | ORAL | Status: AC
  Administered 2015-09-29: 1 via ORAL
  Filled 2015-09-29: qty 1

## 2015-09-29 MED ORDER — CYCLOBENZAPRINE HCL 10 MG PO TABS: 10.0000 mg | ORAL_TABLET | Freq: Two times a day (BID) | ORAL | Status: DC | PRN

## 2015-09-29 NOTE — ED Provider Notes (Signed)
CSN: [id number]     Arrival date & time 09/29/15  1712 History  By signing my name below, I, Arianna Nassar, attest that this documentation has been prepared under the direction and in the presence of Alyah Boehning Julio Alm, MD. Electronically Signed: Julien Nordmann, ED Scribe. 09/29/2015. 6:47 PM.    Chief Complaint  Patient presents with  . Shoulder Injury      The history is provided by the patient. No language interpreter was used.   HPI Comments: Alan Sanders is a 44 y.o. male who presents to the Emergency Department complaining of a shoulder injury that occurred today. Pt reports he tripped and hit the back of his left shoulder on his wall. He expresses pain with movement. Pt has been wearing a shoulder brace and has not taken any medication to alleviate the pain.   Past Medical History  Diagnosis Date  . Spleen enlarged   . Asthma   . Tobacco abuse   . Thrombocytopenia ([place])   . GERD (gastroesophageal reflux disease)   . Seizures ([place])     " its been along time ,since my last seizure "  . Arthritis     " in my back "   Past Surgical History  Procedure Laterality Date  . Hernia repair     Family History  Problem Relation Age of Onset  . Diabetes Mother   . Hypertension Mother   . COPD Mother    Social History  Substance Use Topics  . Smoking status: Current Every Day Smoker -- 0.50 packs/day for 24 years    Types: Cigarettes  . Smokeless tobacco: Never Used  . Alcohol Use: No    Review of Systems  Musculoskeletal: Positive for arthralgias.  All other systems reviewed and are negative.     Allergies  Review of patient's allergies indicates no known allergies.  Home Medications   Prior to Admission medications   Medication Sig Start Date End Date Taking? Authorizing Provider  albuterol (PROVENTIL HFA;VENTOLIN HFA) 108 (90 BASE) MCG/ACT inhaler Inhale 2 puffs into the lungs every 6 (six) hours as needed for wheezing or shortness of breath. 02/14/15    Oswald Hillock, MD  azithromycin (ZITHROMAX) 250 MG tablet Take 1 tablet (250 mg total) by mouth daily. 02/14/15   Oswald Hillock, MD  dextromethorphan-guaiFENesin (MUCINEX DM) 30-600 MG per 12 hr tablet Take 1 tablet by mouth 2 (two) times daily. 02/14/15   Oswald Hillock, MD  HYDROcodone-acetaminophen (NORCO/VICODIN) 5-325 MG per tablet Take 1 tablet by mouth every 6 (six) hours as needed for moderate pain. 02/14/15   Oswald Hillock, MD  ondansetron (ZOFRAN ODT) 4 MG disintegrating tablet Take 1 tablet (4 mg total) by mouth every 8 (eight) hours as needed for nausea or vomiting. Patient not taking: Reported on 02/12/2015 10/07/14   Alvina Chou, PA-C  pantoprazole (PROTONIX) 40 MG tablet Take 1 tablet (40 mg total) by mouth daily at 12 noon. 02/14/15   Oswald Hillock, MD  promethazine (PHENERGAN) 25 MG tablet Take 1 tablet (25 mg total) by mouth every 6 (six) hours as needed for nausea or vomiting. Patient not taking: Reported on 02/12/2015 12/03/13   Ashley Murrain, NP  traMADol (ULTRAM) 50 MG tablet Take 1 tablet (50 mg total) by mouth every 6 (six) hours as needed. Patient not taking: Reported on 02/12/2015 10/07/14   Alvina Chou, PA-C  trimethoprim-polymyxin b (POLYTRIM) ophthalmic solution Place 1 drop into the left eye every 4 (four) hours.  Patient not taking: Reported on 02/12/2015 10/07/14   Alvina Chou, PA-C   Triage vitals: BP 139/94 mmHg  Pulse 99  Temp(Src) 98.5 F (36.9 C) (Oral)  Resp 16  Ht 6\' 2"  (1.88 m)  Wt 228 lb (103.42 kg)  BMI 29.26 kg/m2  SpO2 100% Physical Exam  Constitutional: He is oriented to person, place, and time. He appears well-developed and well-nourished.  HENT:  Head: Normocephalic and atraumatic.  Eyes: EOM are normal.  Neck: Normal range of motion.  Cardiovascular: Normal rate, regular rhythm, normal heart sounds and intact distal pulses.   Pulmonary/Chest: Effort normal and breath sounds normal. No respiratory distress.  Abdominal: Soft. He exhibits  no distension. There is no tenderness.  Musculoskeletal: Normal range of motion.  Pain on anterior left shoulder, normal ROM  Neurological: He is alert and oriented to person, place, and time.  Skin: Skin is warm and dry.  Psychiatric: He has a normal mood and affect. Judgment normal.  Nursing note and vitals reviewed.   ED Course  Procedures  DIAGNOSTIC STUDIES: Oxygen Saturation is 100% on RA, normal by my interpretation.  COORDINATION OF CARE:  6:47 PM Discussed treatment plan which includes ibuprofen, muscle relaxer with pt at bedside and pt agreed to plan.  Labs Review Labs Reviewed - No data to display  Imaging Review Dg Shoulder Left  09/29/2015  CLINICAL DATA:  44 year old male with history of trauma from a fall earlier today complaining of left shoulder pain. EXAM: LEFT SHOULDER - 2+ VIEW COMPARISON:  [facility]. FINDINGS: Multiple views of the left shoulder demonstrate no acute displaced fracture, subluxation, dislocation, or soft tissue abnormality. Mild pleural thickening in the apex of the left hemithorax, unchanged compared to prior chest x-ray 02/12/2015, presumably chronic post infectious or inflammatory scarring. IMPRESSION: No acute radiographic abnormality of the left shoulder. Electronically Signed   By: Vinnie Langton M.D.   On: 09/29/2015 17:58   I have personally reviewed and evaluated these images and lab results as part of my medical decision-making.   EKG Interpretation None      MDM   Final diagnoses:  Fall    Pt fell onto wall, pain to left shoulder. Normal ROM. No evidence of trauma. Neg xray.   I personally performed the services described in this documentation, which was scribed in my presence. The recorded information has been reviewed and is accurate.    Josede Cicero Julio Alm, MD 09/29/15 1910

## 2015-09-29 NOTE — ED Notes (Signed)
Patient states that he fell and hurt his left shoulder. CMS within normal limits

## 2016-01-05 ENCOUNTER — Emergency Department (HOSPITAL_COMMUNITY)
Admission: EM | Admit: 2016-01-05 | Discharge: 2016-01-05 | Disposition: A | Discharge status: Home / Self Care | Payer: BLUE CROSS/BLUE SHIELD | Attending: Emergency Medicine | Admitting: Emergency Medicine

## 2016-01-05 ENCOUNTER — Emergency Department (HOSPITAL_COMMUNITY): Payer: BLUE CROSS/BLUE SHIELD

## 2016-01-05 DIAGNOSIS — Y9389 Activity, other specified: Secondary | ICD-10-CM | POA: Diagnosis not present

## 2016-01-05 DIAGNOSIS — R52 Pain, unspecified: Secondary | ICD-10-CM

## 2016-01-05 DIAGNOSIS — Y998 Other external cause status: Secondary | ICD-10-CM | POA: Insufficient documentation

## 2016-01-05 DIAGNOSIS — F17210 Nicotine dependence, cigarettes, uncomplicated: Secondary | ICD-10-CM | POA: Insufficient documentation

## 2016-01-05 DIAGNOSIS — Z791 Long term (current) use of non-steroidal anti-inflammatories (NSAID): Secondary | ICD-10-CM | POA: Diagnosis not present

## 2016-01-05 DIAGNOSIS — K219 Gastro-esophageal reflux disease without esophagitis: Secondary | ICD-10-CM | POA: Diagnosis not present

## 2016-01-05 DIAGNOSIS — Z792 Long term (current) use of antibiotics: Secondary | ICD-10-CM | POA: Insufficient documentation

## 2016-01-05 DIAGNOSIS — Z79899 Other long term (current) drug therapy: Secondary | ICD-10-CM | POA: Diagnosis not present

## 2016-01-05 DIAGNOSIS — S8011XA Contusion of right lower leg, initial encounter: Secondary | ICD-10-CM | POA: Insufficient documentation

## 2016-01-05 DIAGNOSIS — Y9259 Other trade areas as the place of occurrence of the external cause: Secondary | ICD-10-CM | POA: Diagnosis not present

## 2016-01-05 DIAGNOSIS — M47896 Other spondylosis, lumbar region: Secondary | ICD-10-CM | POA: Insufficient documentation

## 2016-01-05 DIAGNOSIS — Z862 Personal history of diseases of the blood and blood-forming organs and certain disorders involving the immune mechanism: Secondary | ICD-10-CM | POA: Diagnosis not present

## 2016-01-05 DIAGNOSIS — J45909 Unspecified asthma, uncomplicated: Secondary | ICD-10-CM | POA: Insufficient documentation

## 2016-01-05 DIAGNOSIS — S8991XA Unspecified injury of right lower leg, initial encounter: Secondary | ICD-10-CM | POA: Diagnosis present
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
## 2016-01-05 MED ORDER — HYDROCODONE-ACETAMINOPHEN 5-325 MG PO TABS: 1.0000 | ORAL_TABLET | Freq: Four times a day (QID) | ORAL | Status: DC | PRN
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
## 2016-01-05 NOTE — Discharge Instructions (Signed)
Heat Therapy °Heat therapy can help ease sore, stiff, injured, and tight muscles and joints. Heat relaxes your muscles, which may help ease your pain. Heat therapy should only be used on old, pre-existing, or long-lasting (chronic) injuries. Do not use heat therapy unless told by your doctor. °HOW TO USE HEAT THERAPY °There are several different kinds of heat therapy, including: °· Moist heat pack. °· Warm water bath. °· Hot water bottle. °· Electric heating pad. °· Heated gel pack. °· Heated wrap. °· Electric heating pad. °GENERAL HEAT THERAPY RECOMMENDATIONS  °· Do not sleep while using heat therapy. Only use heat therapy while you are awake. °· Your skin may turn pink while using heat therapy. Do not use heat therapy if your skin turns red. °· Do not use heat therapy if you have new pain. °· High heat or long exposure to heat can cause burns. Be careful when using heat therapy to avoid burning your skin. °· Do not use heat therapy on areas of your skin that are already irritated, such as with a rash or sunburn. °GET HELP IF:  °· You have blisters, redness, swelling (puffiness), or numbness. °· You have new pain. °· Your pain is worse. °MAKE SURE YOU: °· Understand these instructions. °· Will watch your condition. °· Will get help right away if you are not doing well or get worse. °  °This information is not intended to replace advice given to you by your health care provider. Make sure you discuss any questions you have with your health care provider. °  °Document Released: 02/02/2012 Document Revised: 12/01/2014 Document Reviewed: 01/03/2014 °Elsevier Interactive Patient Education ©2016 [employer] ° °

## 2016-03-12 ENCOUNTER — Emergency Department (HOSPITAL_COMMUNITY)
Admission: EM | Admit: 2016-03-12 | Discharge: 2016-03-12 | Disposition: A | Discharge status: Home / Self Care | Payer: BLUE CROSS/BLUE SHIELD | Attending: Emergency Medicine | Admitting: Emergency Medicine

## 2016-03-12 ENCOUNTER — Emergency Department (HOSPITAL_COMMUNITY): Payer: BLUE CROSS/BLUE SHIELD

## 2016-03-12 ENCOUNTER — Encounter (HOSPITAL_COMMUNITY): Payer: Self-pay | Admitting: *Deleted

## 2016-03-12 DIAGNOSIS — M479 Spondylosis, unspecified: Secondary | ICD-10-CM | POA: Diagnosis not present

## 2016-03-12 DIAGNOSIS — Z8719 Personal history of other diseases of the digestive system: Secondary | ICD-10-CM | POA: Insufficient documentation

## 2016-03-12 DIAGNOSIS — F17210 Nicotine dependence, cigarettes, uncomplicated: Secondary | ICD-10-CM | POA: Diagnosis not present

## 2016-03-12 DIAGNOSIS — Z79899 Other long term (current) drug therapy: Secondary | ICD-10-CM | POA: Insufficient documentation

## 2016-03-12 DIAGNOSIS — Z862 Personal history of diseases of the blood and blood-forming organs and certain disorders involving the immune mechanism: Secondary | ICD-10-CM | POA: Insufficient documentation

## 2016-03-12 DIAGNOSIS — J45909 Unspecified asthma, uncomplicated: Secondary | ICD-10-CM | POA: Diagnosis not present

## 2016-03-12 DIAGNOSIS — R109 Unspecified abdominal pain: Secondary | ICD-10-CM | POA: Insufficient documentation

## 2016-03-12 DIAGNOSIS — R112 Nausea with vomiting, unspecified: Secondary | ICD-10-CM | POA: Diagnosis not present

## 2016-03-12 LAB — URINALYSIS, ROUTINE W REFLEX MICROSCOPIC
Bilirubin Urine: NEGATIVE
GLUCOSE, UA: NEGATIVE mg/dL
Hgb urine dipstick: NEGATIVE
KETONES UR: NEGATIVE mg/dL
LEUKOCYTES UA: NEGATIVE
NITRITE: NEGATIVE
PH: 6 (ref 5.0–8.0)
Protein, ur: NEGATIVE mg/dL
SPECIFIC GRAVITY, URINE: 1.019 (ref 1.005–1.030)

## 2016-03-12 IMAGING — CT CT RENAL STONE PROTOCOL
2 of 3 series · 16 of 46 positions shown, 18 images · non-contrast
Comparison: [DATE]

CLINICAL DATA: Increasing left flank pain starting this morning.
Nausea and vomiting.

EXAM:
CT ABDOMEN AND PELVIS WITHOUT CONTRAST
TECHNIQUE: Multidetector CT imaging of the abdomen and pelvis was performed
following the standard protocol without IV contrast.

[Series 3: lung · axial · 0.87mm/px · z∈[+207,+293]mm · 13 of 51 slices shown, 15 images]
[im 4/51  soft-tissue]
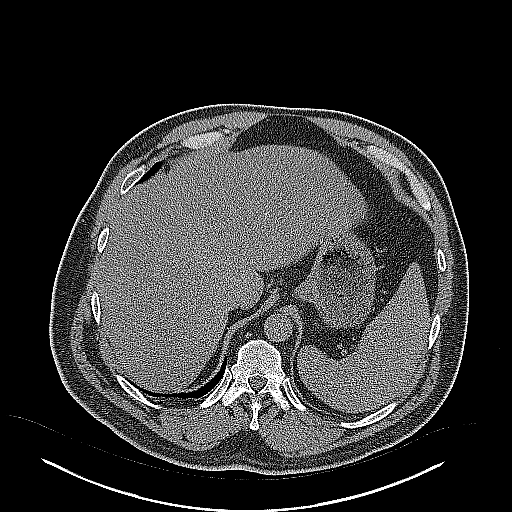
[im 4/51  bone]
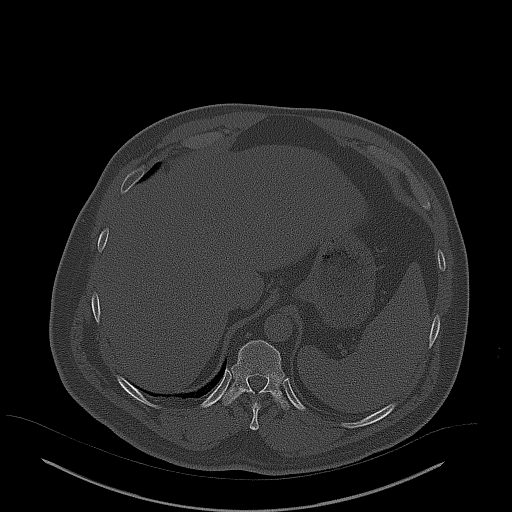
[im 7/51  soft-tissue]
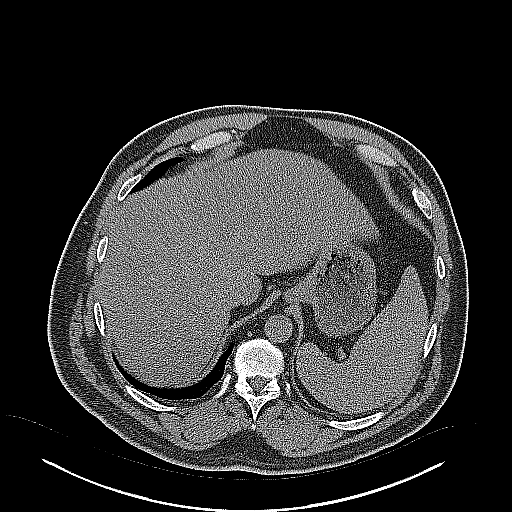
[im 10/51  soft-tissue]
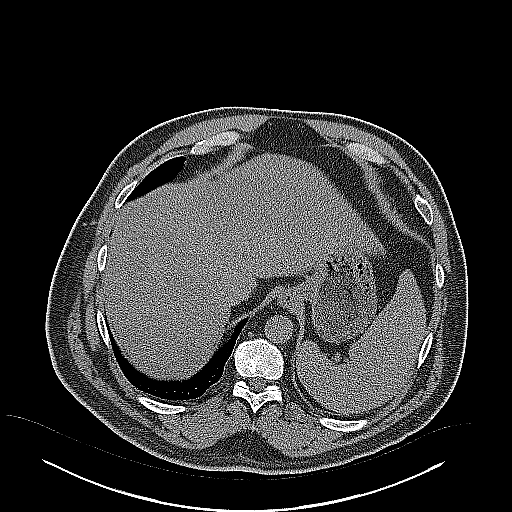
[im 15/51  soft-tissue]
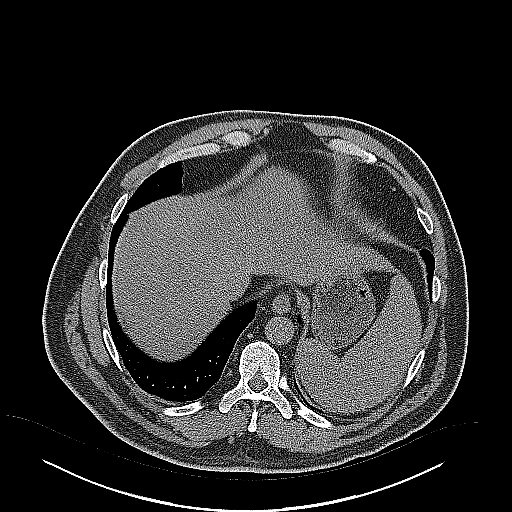
[im 18/51  soft-tissue]
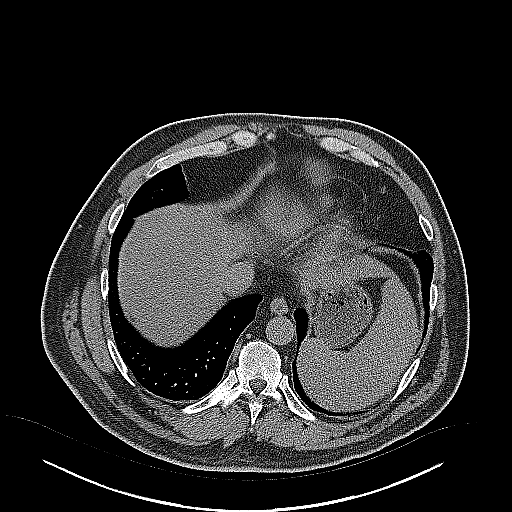
[im 21/51  soft-tissue]
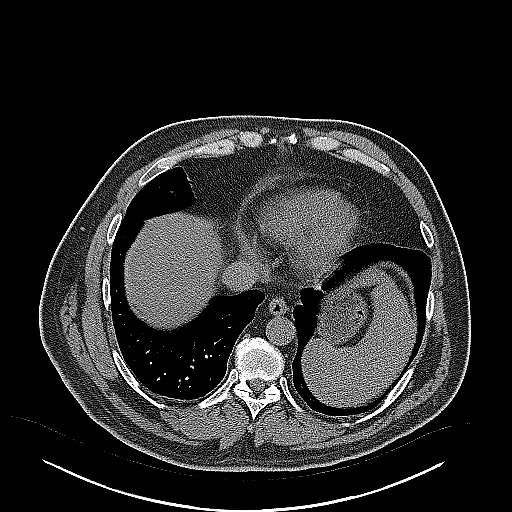
[im 26/51  soft-tissue]
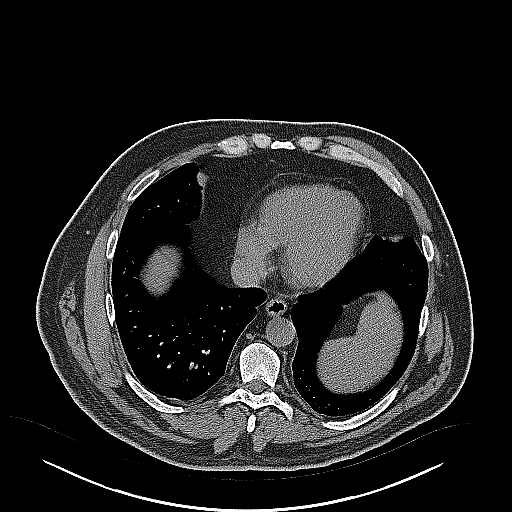
[im 30/51  soft-tissue]
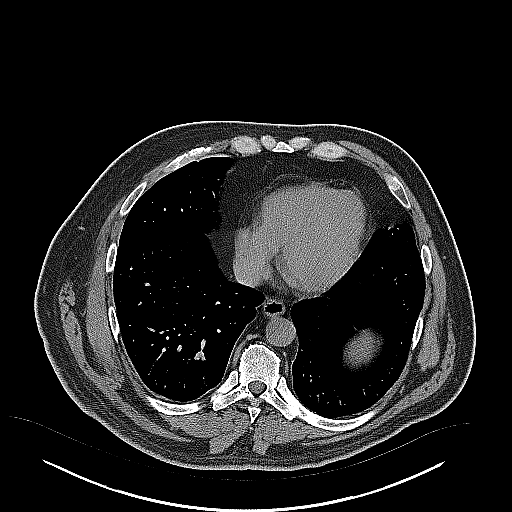
[im 33/51  soft-tissue]
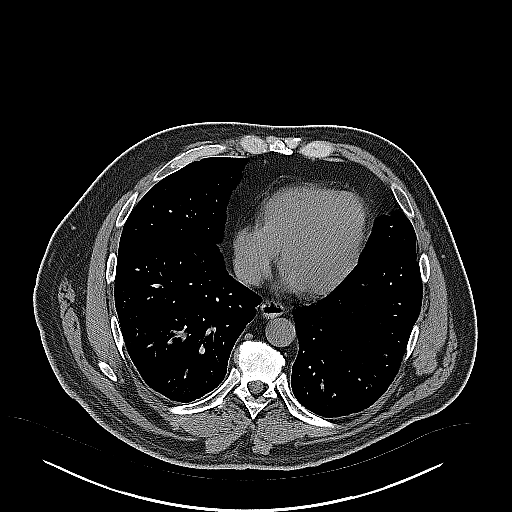
[im 33/51  bone]
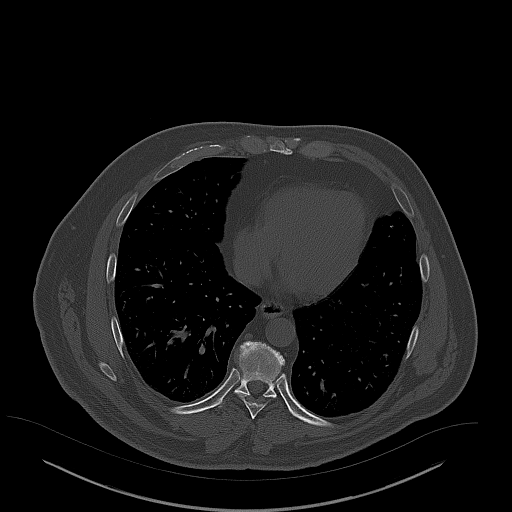
[im 36/51  soft-tissue]
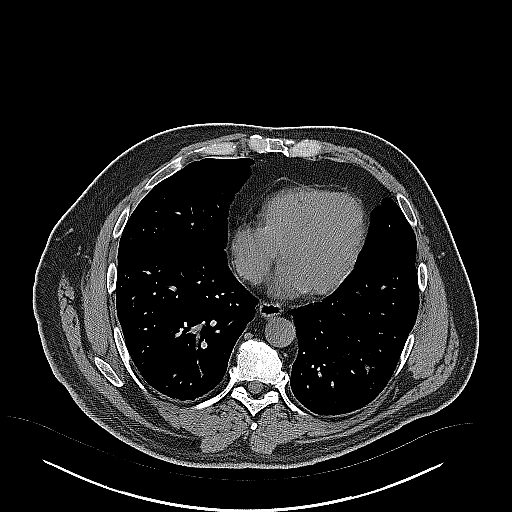
[im 41/51  soft-tissue]
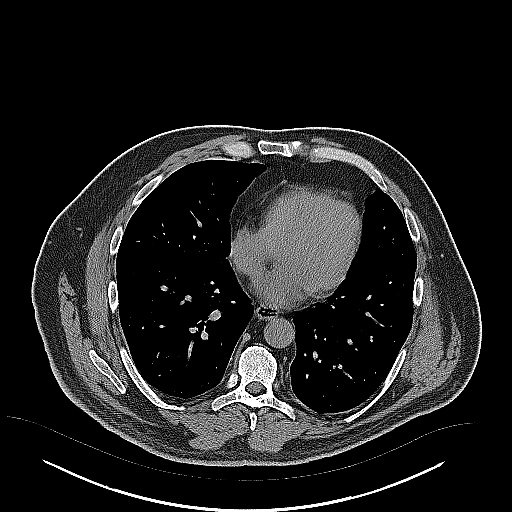
[im 44/51  soft-tissue]
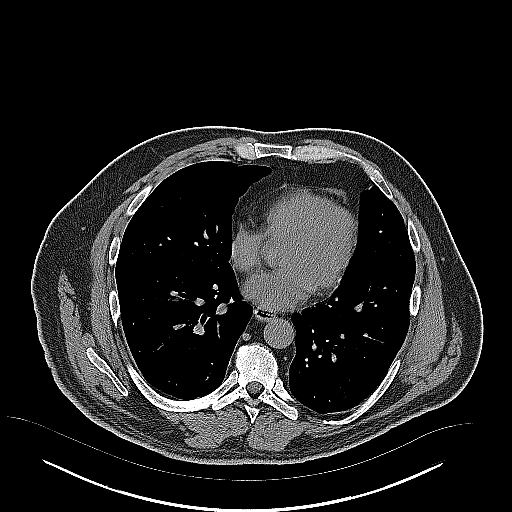
[im 47/51  soft-tissue]
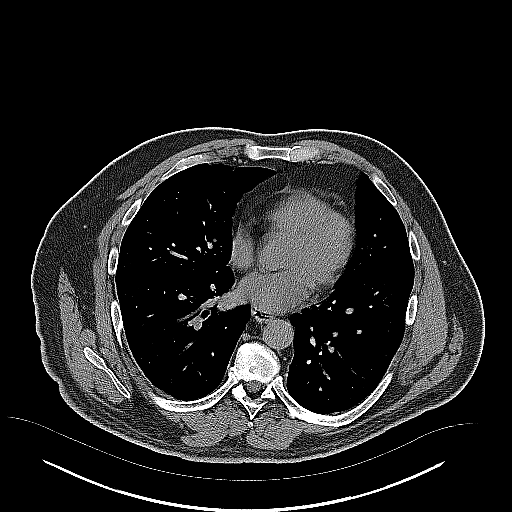

[Series 4: coronal · coronal · 0.79mm/px · 3 of 163 slices shown]
[im 55/163  soft-tissue]
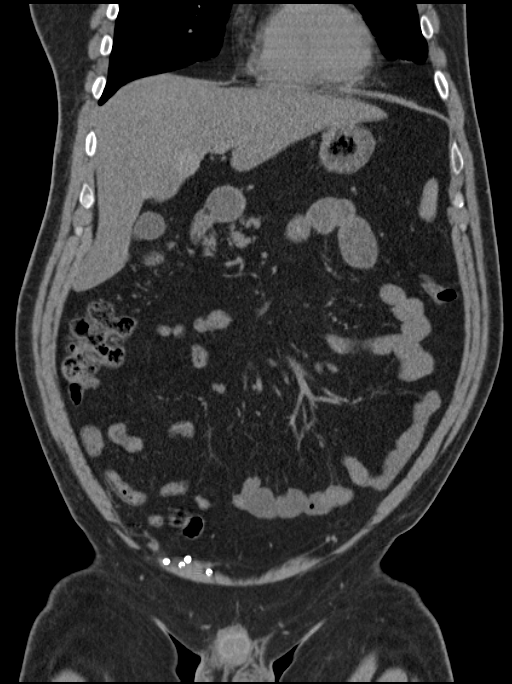
[im 73/163  soft-tissue]
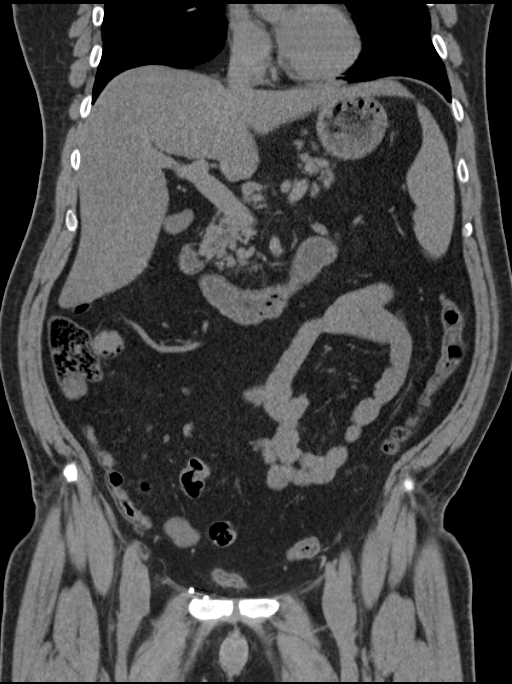
[im 91/163  soft-tissue]
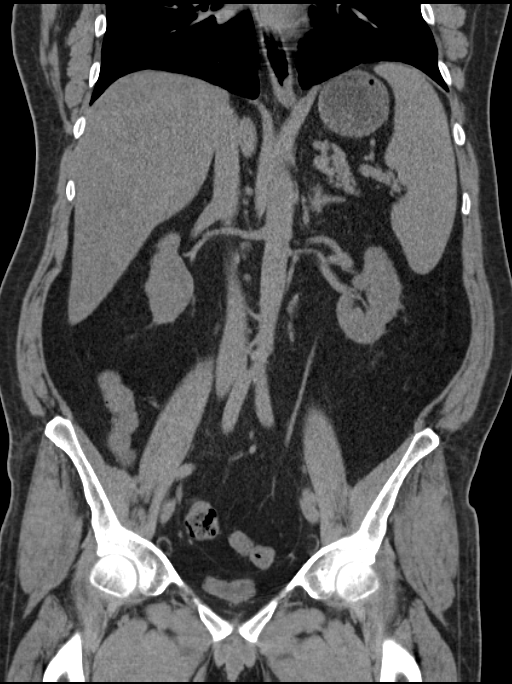

[16 of 46 positions shown; findings below may reference images not displayed]

FINDINGS: The lung bases are clear.

Kidneys appear symmetrical in size and shape. No hydronephrosis or
hydroureter. No renal, ureteral, or bladder stones. Bladder is
decompressed.

Mild diffuse fatty infiltration of the liver. Mild splenic
enlargement. Unenhanced appearance of the gallbladder, pancreas,
adrenal glands, abdominal aorta, inferior vena cava, and
retroperitoneal lymph nodes is unremarkable.

Stomach, small bowel, and colon are not abnormally distended. No
free air or free fluid in the abdomen.

Pelvis: The appendix is normal. Prostate gland is not enlarged. No
free or loculated pelvic fluid collections. No pelvic mass or
lymphadenopathy. No evidence of diverticulitis. Postoperative
changes consistent with right inguinal hernia repair. Small left
inguinal hernia containing fat. Mild degenerative changes in the
spine. No destructive bone lesions.
IMPRESSION: No renal or ureteral stone or obstruction. Diffuse fatty
infiltration of the liver. Minimal left inguinal hernia containing
fat.

## 2016-03-12 MED ORDER — FENTANYL CITRATE (PF) 100 MCG/2ML IJ SOLN
50.0000 ug | INTRAMUSCULAR | Status: DC | PRN
  Filled 2016-03-12: qty 2

## 2016-03-12 MED ORDER — ONDANSETRON HCL 4 MG/2ML IJ SOLN
4.0000 mg | Freq: Once | INTRAMUSCULAR | Status: AC
  Administered 2016-03-12: 4 mg via INTRAVENOUS
  Filled 2016-03-12: qty 2

## 2016-03-12 MED ORDER — OXYCODONE-ACETAMINOPHEN 5-325 MG PO TABS: 1.0000 | ORAL_TABLET | ORAL | Status: DC | PRN

## 2016-03-12 MED ORDER — HYDROMORPHONE HCL 1 MG/ML IJ SOLN
1.0000 mg | Freq: Once | INTRAMUSCULAR | Status: AC
Start: 2016-03-12 — End: 2016-03-12
  Administered 2016-03-12: 1 mg via INTRAVENOUS
  Filled 2016-03-12: qty 1

## 2016-03-12 MED ORDER — HYDROMORPHONE HCL 1 MG/ML IJ SOLN
1.0000 mg | Freq: Once | INTRAMUSCULAR | Status: AC
  Administered 2016-03-12: 1 mg via INTRAVENOUS
  Filled 2016-03-12: qty 1

## 2016-03-12 NOTE — ED Notes (Signed)
Discharge instructions, follow up care, and rx x1 reviewed with patient. Patient verbalized understanding. 

## 2016-03-12 NOTE — ED Provider Notes (Signed)
CSN: [id number]     Arrival date & time 03/12/16  0424 History   First MD Initiated Contact with Patient 03/12/16 0448     Chief Complaint  Patient presents with  . Flank Pain     (Consider location/radiation/quality/duration/timing/severity/associated sxs/prior Treatment) HPI  This is a 45 year old male who had the sudden onset of left flank pain approximately 2 hours ago. The pain is described as severe and is associated with nausea and vomiting. He has not noticed any urinary changes. He describes the pain as a grabbing sensation radiating upward. He is unable to find a comfortable position. He has no history of nephrolithiasis. He did have a similar episode of pain a couple weeks ago that was not as severe. He was seen at [facility] where "they didn't do anything but refer me to my doctor". His PCP is here in [place].  Past Medical History  Diagnosis Date  . Spleen enlarged   . Asthma   . Tobacco abuse   . Thrombocytopenia ([place])   . GERD (gastroesophageal reflux disease)   . Seizures ([place])     " its been along time ,since my last seizure "  . Arthritis     " in my back "   Past Surgical History  Procedure Laterality Date  . Hernia repair     Family History  Problem Relation Age of Onset  . Diabetes Mother   . Hypertension Mother   . COPD Mother    Social History  Substance Use Topics  . Smoking status: Current Every Day Smoker -- 0.50 packs/day for 24 years    Types: Cigarettes  . Smokeless tobacco: Never Used  . Alcohol Use: No    Review of Systems  All other systems reviewed and are negative.   Allergies  Review of patient's allergies indicates no known allergies.  Home Medications   Prior to Admission medications   Medication Sig Start Date End Date Taking? Authorizing Provider  albuterol (PROVENTIL HFA;VENTOLIN HFA) 108 (90 BASE) MCG/ACT inhaler Inhale 2 puffs into the lungs every 6 (six) hours as needed for wheezing or shortness of breath.  02/14/15  Yes Oswald Hillock, MD  cyclobenzaprine (FLEXERIL) 10 MG tablet Take 1 tablet (10 mg total) by mouth 2 (two) times daily as needed for muscle spasms. 09/29/15  Yes Courteney Lyn Mackuen, MD  HYDROcodone-acetaminophen (NORCO/VICODIN) 5-325 MG tablet Take 1 tablet by mouth every 6 (six) hours as needed for moderate pain. 01/05/16  Yes Leonard Schwartz, MD  ibuprofen (ADVIL,MOTRIN) 800 MG tablet Take 1 tablet (800 mg total) by mouth 3 (three) times daily. Patient taking differently: Take 800 mg by mouth 3 (three) times daily as needed for headache, mild pain or moderate pain.  09/29/15  Yes Courteney Lyn Mackuen, MD   BP 140/99 mmHg  Temp(Src) 97.8 F (36.6 C) (Oral)  Resp 18  SpO2 100%   Physical Exam  General: Well-developed, well-nourished male in obvious discomfort; appearance consistent with age of record HENT: normocephalic; atraumatic Eyes: pupils equal, round and reactive to light; extraocular muscles intact Neck: supple Heart: regular rate and rhythm Lungs: clear to auscultation bilaterally Abdomen: soft; nondistended; nontender; no masses or hepatosplenomegaly; bowel sounds present GU: Left CVA tenderness Extremities: No deformity; full range of motion; pulses normal Neurologic: Awake, alert and oriented; motor function intact in all extremities and symmetric; no facial droop Skin: Warm and dry Psychiatric: Agitated    ED Course  Procedures (including critical care time)   MDM  Nursing  notes and vitals signs, including pulse oximetry, reviewed.  Summary of this visit's results, reviewed by myself:  Labs:  Results for orders placed or performed during the hospital encounter of 03/12/16 (from the past 24 hour(s))  Urinalysis, Routine w reflex microscopic (not at [facility])     Status: None   Collection Time: 03/12/16  5:31 AM  Result Value Ref Range   Color, Urine YELLOW YELLOW   APPearance CLEAR CLEAR   Specific Gravity, Urine 1.019 1.005 - 1.030   pH 6.0 5.0 - 8.0    Glucose, UA NEGATIVE NEGATIVE mg/dL   Hgb urine dipstick NEGATIVE NEGATIVE   Bilirubin Urine NEGATIVE NEGATIVE   Ketones, ur NEGATIVE NEGATIVE mg/dL   Protein, ur NEGATIVE NEGATIVE mg/dL   Nitrite NEGATIVE NEGATIVE   Leukocytes, UA NEGATIVE NEGATIVE    Imaging Studies: Ct Renal Stone Study  03/12/2016  CLINICAL DATA:  Increasing left flank pain starting this morning. Nausea and vomiting. EXAM: CT ABDOMEN AND PELVIS WITHOUT CONTRAST TECHNIQUE: Multidetector CT imaging of the abdomen and pelvis was performed following the standard protocol without IV contrast. COMPARISON:  10/07/2014 FINDINGS: The lung bases are clear. Kidneys appear symmetrical in size and shape. No hydronephrosis or hydroureter. No renal, ureteral, or bladder stones. Bladder is decompressed. Mild diffuse fatty infiltration of the liver. Mild splenic enlargement. Unenhanced appearance of the gallbladder, pancreas, adrenal glands, abdominal aorta, inferior vena cava, and retroperitoneal lymph nodes is unremarkable. Stomach, small bowel, and colon are not abnormally distended. No free air or free fluid in the abdomen. Pelvis: The appendix is normal. Prostate gland is not enlarged. No free or loculated pelvic fluid collections. No pelvic mass or lymphadenopathy. No evidence of diverticulitis. Postoperative changes consistent with right inguinal hernia repair. Small left inguinal hernia containing fat. Mild degenerative changes in the spine. No destructive bone lesions. IMPRESSION: No renal or ureteral stone or obstruction. Diffuse fatty infiltration of the liver. Minimal left inguinal hernia containing fat. Electronically Signed   By: Lucienne Capers M.D.   On: 03/12/2016 05:47   7:29 AM Patient's pain significantly improved after IV Dilaudid. There is no evidence of nephrolithiasis or ureteral stone. We will treat him symptomatically and refer him to his PCP. This may represent a musculoskeletal or radicular etiology.    Shanon Rosser,  MD 03/12/16 0730

## 2016-03-12 NOTE — ED Notes (Signed)
Pt c/o sudden onset left flank pain approx 2 hrs ago; pt states that the pain has gotten progressively worse; pt c/o N/V; pt denies urinary symptoms; pt denies injury to area; pt was at work when pain started

## 2016-08-03 ENCOUNTER — Encounter (HOSPITAL_BASED_OUTPATIENT_CLINIC_OR_DEPARTMENT_OTHER): Payer: Self-pay | Admitting: Respiratory Therapy

## 2016-08-03 ENCOUNTER — Emergency Department (HOSPITAL_BASED_OUTPATIENT_CLINIC_OR_DEPARTMENT_OTHER)
Admission: EM | Admit: 2016-08-03 | Discharge: 2016-08-03 | Disposition: A | Discharge status: Home / Self Care | Payer: BLUE CROSS/BLUE SHIELD | Attending: Emergency Medicine | Admitting: Emergency Medicine

## 2016-08-03 DIAGNOSIS — R112 Nausea with vomiting, unspecified: Secondary | ICD-10-CM | POA: Diagnosis not present

## 2016-08-03 DIAGNOSIS — F17210 Nicotine dependence, cigarettes, uncomplicated: Secondary | ICD-10-CM | POA: Diagnosis not present

## 2016-08-03 DIAGNOSIS — Z791 Long term (current) use of non-steroidal anti-inflammatories (NSAID): Secondary | ICD-10-CM | POA: Diagnosis not present

## 2016-08-03 DIAGNOSIS — Z79899 Other long term (current) drug therapy: Secondary | ICD-10-CM | POA: Insufficient documentation

## 2016-08-03 DIAGNOSIS — R0981 Nasal congestion: Secondary | ICD-10-CM | POA: Insufficient documentation

## 2016-08-03 DIAGNOSIS — J029 Acute pharyngitis, unspecified: Secondary | ICD-10-CM | POA: Diagnosis present

## 2016-08-03 DIAGNOSIS — J45909 Unspecified asthma, uncomplicated: Secondary | ICD-10-CM | POA: Insufficient documentation

## 2016-08-03 LAB — RAPID STREP SCREEN (MED CTR MEBANE ONLY): Streptococcus, Group A Screen (Direct): NEGATIVE

## 2016-08-03 MED ORDER — IBUPROFEN 800 MG PO TABS
800.0000 mg | ORAL_TABLET | Freq: Once | ORAL | Status: AC
  Administered 2016-08-03: 800 mg via ORAL
  Filled 2016-08-03: qty 1

## 2016-08-03 MED ORDER — ONDANSETRON 8 MG PO TBDP: 8.0000 mg | ORAL_TABLET | Freq: Three times a day (TID) | ORAL | 0 refills | Status: DC | PRN

## 2016-08-03 MED ORDER — HYDROCODONE-ACETAMINOPHEN 7.5-325 MG/15ML PO SOLN: 15.0000 mL | Freq: Four times a day (QID) | ORAL | 0 refills | Status: DC | PRN

## 2016-08-03 MED ORDER — ONDANSETRON 4 MG PO TBDP
4.0000 mg | ORAL_TABLET | Freq: Once | ORAL | Status: AC
  Administered 2016-08-03: 4 mg via ORAL
  Filled 2016-08-03: qty 1

## 2016-08-03 NOTE — ED Notes (Signed)
C/o cough, chills, nv, nasal congestion, sore throat and HA. C/c is throat, head and vomiting. sx onset yesterday. (denies: diarrhea, earaches, facial pain, dizziness, sob or CP), last took tylenol 975mg  at 2000. Vomited x4 in last 24 hrs. Alert, NAD, calm, interactive, resps e/u, no dyspnea, tachypneic, "feels cold".

## 2016-08-03 NOTE — ED Triage Notes (Signed)
EDP at [facility] 

## 2016-08-03 NOTE — ED Provider Notes (Signed)
[facility] DEPT [facility] Provider Note   CSN: [id number] Arrival date & time: 08/03/16  0636     History   Chief Complaint Chief Complaint  Patient presents with  . Sore Throat    HPI Alan Sanders is a 45 y.o. male.  The history is provided by the patient.  Sore Throat  Pertinent negatives include no chest pain and no shortness of breath.  Patient presents with a sore throat. Sent for the last day. Worse with swallowing. He is also vomited 4 times. He has had some head congestion. States he hurts all over. States he's had chills. No sick contacts but states he does work at [employer] no difficulty breathing. No diarrhea. States he does have a throbbing headache. No rash.  Past Medical History:  Diagnosis Date  . Arthritis    " in my back "  . Asthma   . GERD (gastroesophageal reflux disease)   . Seizures ([place])    " its been along time ,since my last seizure "  . Spleen enlarged   . Thrombocytopenia ([place])   . Tobacco abuse     Patient Active Problem List   Diagnosis Date Noted  . Cough 02/13/2015  . Thrombocytopenia ([place]) 02/13/2015  . Chest pain 02/12/2015  . Asthma 02/12/2015  . Tobacco abuse 02/12/2015  . Nausea vomiting and diarrhea 02/12/2015  . Pain in the chest     Past Surgical History:  Procedure Laterality Date  . HERNIA REPAIR         Home Medications    Prior to Admission medications   Medication Sig Start Date End Date Taking? Authorizing Provider  albuterol (PROVENTIL HFA;VENTOLIN HFA) 108 (90 BASE) MCG/ACT inhaler Inhale 2 puffs into the lungs every 6 (six) hours as needed for wheezing or shortness of breath. 02/14/15   Oswald Hillock, MD  cyclobenzaprine (FLEXERIL) 10 MG tablet Take 1 tablet (10 mg total) by mouth 2 (two) times daily as needed for muscle spasms. 09/29/15   Courteney Lyn Mackuen, MD  HYDROcodone-acetaminophen (HYCET) 7.5-325 mg/15 ml solution Take 15 mLs by mouth every 6 (six) hours as needed for moderate pain. 08/03/16   Davonna Belling, MD  ibuprofen (ADVIL,MOTRIN) 800 MG tablet Take 1 tablet (800 mg total) by mouth 3 (three) times daily. Patient taking differently: Take 800 mg by mouth 3 (three) times daily as needed for headache, mild pain or moderate pain.  09/29/15   Courteney Lyn Mackuen, MD  ondansetron (ZOFRAN-ODT) 8 MG disintegrating tablet Take 1 tablet (8 mg total) by mouth every 8 (eight) hours as needed for nausea or vomiting. 08/03/16   Davonna Belling, MD  oxyCODONE-acetaminophen (PERCOCET) 5-325 MG tablet Take 1-2 tablets by mouth every 4 (four) hours as needed for severe pain. 03/12/16   Shanon Rosser, MD    Family History Family History  Problem Relation Age of Onset  . Diabetes Mother   . Hypertension Mother   . COPD Mother     Social History Social History  Substance Use Topics  . Smoking status: Current Every Day Smoker    Packs/day: 0.50    Years: 24.00    Types: Cigarettes  . Smokeless tobacco: Never Used  . Alcohol use No     Allergies   Review of patient's allergies indicates no known allergies.   Review of Systems Review of Systems  Constitutional: Positive for appetite change and chills.  HENT: Positive for congestion and sore throat.   Eyes: Negative for photophobia.  Respiratory: Negative  for shortness of breath.   Cardiovascular: Negative for chest pain.  Gastrointestinal: Positive for nausea and vomiting. Negative for diarrhea.  Genitourinary: Negative for enuresis.  Musculoskeletal: Negative for back pain.     Physical Exam Updated Vital Signs BP 127/84 (BP Location: Right Arm)   Pulse 99   Temp 99.6 F (37.6 C) (Oral)   Resp 18   Ht 6\' 2"  (1.88 m)   Wt 230 lb (104.3 kg)   SpO2 100%   BMI 29.53 kg/m   Physical Exam  Constitutional: He appears well-developed and well-nourished.  HENT:  Posterior pharyngeal erythema without exudate. Uvula midline.  Neck: Neck supple.  Cardiovascular: Normal rate.   Pulmonary/Chest: Effort normal.  Abdominal: Soft.  There is no tenderness.  Musculoskeletal: He exhibits no edema.  Neurological: He is alert.  Skin: Skin is warm.     ED Treatments / Results  Labs (all labs ordered are listed, but only abnormal results are displayed) Labs Reviewed  RAPID STREP SCREEN (NOT AT [facility])  CULTURE, GROUP A STREP [facility])    EKG  EKG Interpretation None       Radiology No results found.  Procedures Procedures (including critical care time)  Medications Ordered in ED Medications  ondansetron (ZOFRAN-ODT) disintegrating tablet 4 mg (4 mg Oral Given 08/03/16 0723)     Initial Impression / Assessment and Plan / ED Course  I have reviewed the triage vital signs and the nursing notes.  Pertinent labs & imaging results that were available during my care of the patient were reviewed by me and considered in my medical decision making (see chart for details).  Clinical Course    Patient presents with sore throat nausea and vomiting. Likely viral syndrome. Negative strep test. Will treat with symptomatic pain relief and antiemetics. Lungs clear. Discharge home. Strep culture has been sent.  Final Clinical Impressions(s) / ED Diagnoses   Final diagnoses:  Pharyngitis  Non-intractable vomiting with nausea, vomiting of unspecified type    New Prescriptions New Prescriptions   HYDROCODONE-ACETAMINOPHEN (HYCET) 7.5-325 MG/15 ML SOLUTION    Take 15 mLs by mouth every 6 (six) hours as needed for moderate pain.   ONDANSETRON (ZOFRAN-ODT) 8 MG DISINTEGRATING TABLET    Take 1 tablet (8 mg total) by mouth every 8 (eight) hours as needed for nausea or vomiting.     Davonna Belling, MD 08/03/16 [phone]

## 2016-08-05 LAB — CULTURE, GROUP A STREP (THRC)

## 2016-12-15 ENCOUNTER — Emergency Department (HOSPITAL_BASED_OUTPATIENT_CLINIC_OR_DEPARTMENT_OTHER)
Admission: EM | Admit: 2016-12-15 | Discharge: 2016-12-15 | Disposition: A | Discharge status: Home / Self Care | Payer: BLUE CROSS/BLUE SHIELD | Attending: Emergency Medicine | Admitting: Emergency Medicine

## 2016-12-15 ENCOUNTER — Encounter (HOSPITAL_BASED_OUTPATIENT_CLINIC_OR_DEPARTMENT_OTHER): Payer: Self-pay

## 2016-12-15 DIAGNOSIS — R112 Nausea with vomiting, unspecified: Secondary | ICD-10-CM | POA: Diagnosis not present

## 2016-12-15 DIAGNOSIS — M791 Myalgia: Secondary | ICD-10-CM | POA: Insufficient documentation

## 2016-12-15 DIAGNOSIS — R197 Diarrhea, unspecified: Secondary | ICD-10-CM | POA: Insufficient documentation

## 2016-12-15 DIAGNOSIS — R509 Fever, unspecified: Secondary | ICD-10-CM | POA: Diagnosis not present

## 2016-12-15 DIAGNOSIS — J45909 Unspecified asthma, uncomplicated: Secondary | ICD-10-CM | POA: Diagnosis not present

## 2016-12-15 DIAGNOSIS — F17210 Nicotine dependence, cigarettes, uncomplicated: Secondary | ICD-10-CM | POA: Diagnosis not present

## 2016-12-15 DIAGNOSIS — R1084 Generalized abdominal pain: Secondary | ICD-10-CM | POA: Insufficient documentation

## 2016-12-15 LAB — CBC WITH DIFFERENTIAL/PLATELET
Basophils Absolute: 0 10*3/uL (ref 0.0–0.1)
Basophils Relative: 0 %
Eosinophils Absolute: 0.1 10*3/uL (ref 0.0–0.7)
Eosinophils Relative: 1 %
HCT: 48.9 % (ref 39.0–52.0)
Hemoglobin: 17.5 g/dL — ABNORMAL HIGH (ref 13.0–17.0)
Lymphocytes Relative: 2 %
Lymphs Abs: 0.2 10*3/uL — ABNORMAL LOW (ref 0.7–4.0)
MCH: 29 pg (ref 26.0–34.0)
MCHC: 35.8 g/dL (ref 30.0–36.0)
MCV: 81.1 fL (ref 78.0–100.0)
Monocytes Absolute: 0.3 10*3/uL (ref 0.1–1.0)
Monocytes Relative: 3 %
Neutro Abs: 9.7 10*3/uL — ABNORMAL HIGH (ref 1.7–7.7)
Neutrophils Relative %: 93 %
Platelets: 113 10*3/uL — ABNORMAL LOW (ref 150–400)
RBC: 6.03 MIL/uL — ABNORMAL HIGH (ref 4.22–5.81)
RDW: 14.5 % (ref 11.5–15.5)
WBC: 10.3 10*3/uL (ref 4.0–10.5)

## 2016-12-15 LAB — URINALYSIS, ROUTINE W REFLEX MICROSCOPIC
Bilirubin Urine: NEGATIVE
GLUCOSE, UA: NEGATIVE mg/dL
HGB URINE DIPSTICK: NEGATIVE
KETONES UR: NEGATIVE mg/dL
Nitrite: NEGATIVE
PROTEIN: NEGATIVE mg/dL
Specific Gravity, Urine: 1.022 (ref 1.005–1.030)
pH: 5.5 (ref 5.0–8.0)

## 2016-12-15 LAB — URINALYSIS, MICROSCOPIC (REFLEX): RBC / HPF: NONE SEEN RBC/hpf (ref 0–5)

## 2016-12-15 LAB — COMPREHENSIVE METABOLIC PANEL
ALT: 55 U/L (ref 17–63)
AST: 39 U/L (ref 15–41)
Albumin: 4.7 g/dL (ref 3.5–5.0)
Alkaline Phosphatase: 86 U/L (ref 38–126)
Anion gap: 10 (ref 5–15)
BUN: 15 mg/dL (ref 6–20)
CO2: 23 mmol/L (ref 22–32)
Calcium: 9.1 mg/dL (ref 8.9–10.3)
Chloride: 101 mmol/L (ref 101–111)
Creatinine, Ser: 1.09 mg/dL (ref 0.61–1.24)
GFR calc Af Amer: >60 mL/min (ref >60)
GFR calc non Af Amer: >60 mL/min (ref >60)
Glucose, Bld: 115 mg/dL — ABNORMAL HIGH (ref 65–99)
Potassium: 4.1 mmol/L (ref 3.5–5.1)
Sodium: 134 mmol/L — ABNORMAL LOW (ref 135–145)
Total Bilirubin: 0.9 mg/dL (ref 0.3–1.2)
Total Protein: 7.7 g/dL (ref 6.5–8.1)

## 2016-12-15 LAB — OCCULT BLOOD X 1 CARD TO LAB, STOOL: Fecal Occult Bld: NEGATIVE

## 2016-12-15 MED ORDER — ACETAMINOPHEN 325 MG PO TABS
650.0000 mg | ORAL_TABLET | Freq: Once | ORAL | Status: AC
  Administered 2016-12-15: 650 mg via ORAL
  Filled 2016-12-15: qty 2

## 2016-12-15 MED ORDER — MORPHINE SULFATE (PF) 4 MG/ML IV SOLN
4.0000 mg | Freq: Once | INTRAVENOUS | Status: AC
  Administered 2016-12-15: 4 mg via INTRAVENOUS
  Filled 2016-12-15: qty 1

## 2016-12-15 MED ORDER — SODIUM CHLORIDE 0.9 % IV BOLUS (SEPSIS)
1000.0000 mL | Freq: Once | INTRAVENOUS | Status: AC
  Administered 2016-12-15: 1000 mL via INTRAVENOUS

## 2016-12-15 MED ORDER — ONDANSETRON HCL 4 MG/2ML IJ SOLN
4.0000 mg | Freq: Once | INTRAMUSCULAR | Status: AC
  Administered 2016-12-15: 4 mg via INTRAVENOUS
  Filled 2016-12-15: qty 2

## 2016-12-15 MED ORDER — ONDANSETRON 8 MG PO TBDP
8.0000 mg | ORAL_TABLET | Freq: Once | ORAL | Status: AC
Start: 2016-12-15 — End: 2016-12-15
  Administered 2016-12-15: 8 mg via ORAL
  Filled 2016-12-15: qty 1

## 2016-12-15 MED ORDER — KETOROLAC TROMETHAMINE 30 MG/ML IJ SOLN
30.0000 mg | Freq: Once | INTRAMUSCULAR | Status: AC
  Administered 2016-12-15: 30 mg via INTRAVENOUS
  Filled 2016-12-15: qty 1

## 2016-12-15 MED ORDER — ACETAMINOPHEN 325 MG PO TABS
ORAL_TABLET | ORAL | Status: AC
  Filled 2016-12-15: qty 1

## 2016-12-15 MED ORDER — OSELTAMIVIR PHOSPHATE 75 MG PO CAPS: 75.0000 mg | ORAL_CAPSULE | Freq: Two times a day (BID) | ORAL | 0 refills | Status: DC

## 2016-12-15 MED ORDER — ONDANSETRON HCL 4 MG PO TABS: 4.0000 mg | ORAL_TABLET | Freq: Four times a day (QID) | ORAL | 0 refills | Status: DC

## 2016-12-15 NOTE — ED Notes (Signed)
Pt dropped one tylenol tab on the floor-was given 650mg  total

## 2016-12-15 NOTE — ED Notes (Signed)
Tolerating PO fluids. EDPA into room. Family x2 at [facility].

## 2016-12-15 NOTE — ED Notes (Signed)
Pt alert, NAD, calm, interactive, resps e/u, no dyspnea noted, VSS, given soda (PO challenge), "feel better".

## 2016-12-15 NOTE — ED Provider Notes (Signed)
[facility] DEPT [facility] Provider Note   CSN: [id number] Arrival date & time: 12/15/16  1655  By signing my name below, I, Dora Sims, attest that this documentation has been prepared under the direction and in the presence of [employer], PA-C. Electronically Signed: Dora Sims, Scribe. 12/15/2016. 6:15 PM.  History   Chief Complaint Chief Complaint  Patient presents with  . Emesis    The history is provided by the patient. No language interpreter was used.     HPI Comments: Alan Sanders is a 46 y.o. male who presents to the Emergency Department complaining of persistent nausea, vomiting, and diarrhea beginning this morning. He reports associated body aches, abdominal pain, and subjective fever/chills. Pt reports his stools have been dark "like charcoal" today. He states he was vomiting every 15-20 minutes PTA but this has improved since receiving Zofran here. He notes his diarrhea has improved since arrival to the ED as well. Pt reports abdominal pain exacerbation with certain positions. No medications or treatments tried PTA. Pt denies hematochezia, hematemesis, CP, SOB, cough, or any other associated symptoms.  Past Medical History:  Diagnosis Date  . Arthritis    " in my back "  . Asthma   . GERD (gastroesophageal reflux disease)   . Seizures ([place])    " its been along time ,since my last seizure "  . Spleen enlarged   . Thrombocytopenia ([place])   . Tobacco abuse     Patient Active Problem List   Diagnosis Date Noted  . Cough 02/13/2015  . Thrombocytopenia ([place]) 02/13/2015  . Chest pain 02/12/2015  . Asthma 02/12/2015  . Tobacco abuse 02/12/2015  . Nausea vomiting and diarrhea 02/12/2015  . Pain in the chest     Past Surgical History:  Procedure Laterality Date  . HERNIA REPAIR         Home Medications    Prior to Admission medications   Medication Sig Start Date End Date Taking? Authorizing Provider  albuterol (PROVENTIL HFA;VENTOLIN HFA) 108  (90 BASE) MCG/ACT inhaler Inhale 2 puffs into the lungs every 6 (six) hours as needed for wheezing or shortness of breath. 02/14/15   Oswald Hillock, MD  ondansetron (ZOFRAN) 4 MG tablet Take 1 tablet (4 mg total) by mouth every 6 (six) hours. 12/15/16   Frederica Kuster, PA-C  oseltamivir (TAMIFLU) 75 MG capsule Take 1 capsule (75 mg total) by mouth every 12 (twelve) hours. 12/15/16   Frederica Kuster, PA-C    Family History Family History  Problem Relation Age of Onset  . Diabetes Mother   . Hypertension Mother   . COPD Mother     Social History Social History  Substance Use Topics  . Smoking status: Current Every Day Smoker    Packs/day: 0.50    Years: 24.00    Types: Cigarettes  . Smokeless tobacco: Never Used  . Alcohol use No     Allergies   Patient has no known allergies.   Review of Systems Review of Systems  Constitutional: Positive for chills and fever.  Respiratory: Negative for cough and shortness of breath.   Cardiovascular: Negative for chest pain.  Gastrointestinal: Positive for abdominal pain, diarrhea, nausea and vomiting. Negative for blood in stool.  Musculoskeletal: Positive for myalgias.  Psychiatric/Behavioral: The patient is not nervous/anxious.      Physical Exam Updated Vital Signs BP 121/80   Pulse 91   Temp 100.8 F (38.2 C) (Oral)   Resp 25   Ht 6'  1" (1.854 m)   Wt 104.3 kg   SpO2 100%   BMI 30.34 kg/m   Physical Exam  Constitutional: He appears well-developed and well-nourished. No distress.  HENT:  Head: Normocephalic and atraumatic.  Mouth/Throat: Oropharynx is clear and moist. No oropharyngeal exudate.  Eyes: Conjunctivae are normal. Pupils are equal, round, and reactive to light. Right eye exhibits no discharge. Left eye exhibits no discharge. No scleral icterus.  Neck: Normal range of motion. Neck supple. No thyromegaly present.  Cardiovascular: Normal rate, regular rhythm, normal heart sounds and intact distal pulses.  Exam  reveals no gallop and no friction rub.   No murmur heard. Pulmonary/Chest: Effort normal and breath sounds normal. No stridor. No respiratory distress. He has no wheezes. He has no rales.  Abdominal: Soft. Bowel sounds are normal. He exhibits no distension. There is tenderness in the periumbilical area. There is no rebound, no guarding and no tenderness at McBurney's point.  Musculoskeletal: He exhibits no edema.  Lymphadenopathy:    He has no cervical adenopathy.  Neurological: He is alert. Coordination normal.  Skin: Skin is warm and dry. No rash noted. He is not diaphoretic. No pallor.  Psychiatric: He has a normal mood and affect.  Nursing note and vitals reviewed.    ED Treatments / Results  Labs (all labs ordered are listed, but only abnormal results are displayed) Labs Reviewed  URINALYSIS, ROUTINE W REFLEX MICROSCOPIC - Abnormal; Notable for the following:       Result Value   Leukocytes, UA SMALL (*)    All other components within normal limits  URINALYSIS, MICROSCOPIC (REFLEX) - Abnormal; Notable for the following:    Bacteria, UA RARE (*)    Squamous Epithelial / LPF 0-5 (*)    All other components within normal limits  CBC WITH DIFFERENTIAL/PLATELET - Abnormal; Notable for the following:    RBC 6.03 (*)    Hemoglobin 17.5 (*)    Platelets 113 (*)    Neutro Abs 9.7 (*)    Lymphs Abs 0.2 (*)    All other components within normal limits  COMPREHENSIVE METABOLIC PANEL - Abnormal; Notable for the following:    Sodium 134 (*)    Glucose, Bld 115 (*)    All other components within normal limits  OCCULT BLOOD X 1 CARD TO LAB, STOOL    EKG  EKG Interpretation None       Radiology No results found.  Procedures Procedures (including critical care time)  DIAGNOSTIC STUDIES: Oxygen Saturation is 96% on RA, adequate by my interpretation.    COORDINATION OF CARE: 6:28 PM Discussed treatment plan with pt at bedside and pt agreed to plan.  Medications Ordered in  ED Medications  ondansetron (ZOFRAN-ODT) disintegrating tablet 8 mg (8 mg Oral Given 12/15/16 1719)  acetaminophen (TYLENOL) tablet 650 mg (650 mg Oral Given 12/15/16 1720)  morphine 4 MG/ML injection 4 mg (4 mg Intravenous Given 12/15/16 1856)  ondansetron (ZOFRAN) injection 4 mg (4 mg Intravenous Given 12/15/16 1856)  sodium chloride 0.9 % bolus 1,000 mL (0 mLs Intravenous Stopped 12/15/16 1900)  ketorolac (TORADOL) 30 MG/ML injection 30 mg (30 mg Intravenous Given 12/15/16 2044)     Initial Impression / Assessment and Plan / ED Course  I have reviewed the triage vital signs and the nursing notes.  Pertinent labs & imaging results that were available during my care of the patient were reviewed by me and considered in my medical decision making (see chart for details).  CBC shows hemoglobin 17.5. CMP shows sodium 134, glucose 115. UA shows small leukocytes, rare bacteria. Fecal occult negative. Patient with flulike symptoms. Generalized abdominal pain and tenderness improved after fluids and Zofran. Lungs are clear. No indication for imaging at this time. Patient tolerating oral fluids. Patient given Toradol in the ED for exacerbated chronic back pain. Patient able to ambulate prior to discharge. Discharged home with Zofran, Tamiflu. Strict return precautions given. Patient understands and agrees with plan. Patient vitals stable throughout ED course and discharged in satisfactory condition. I discussed patient case with Dr. Maryan Rued who guided the patient's management and agrees with plan.  Final Clinical Impressions(s) / ED Diagnoses   Final diagnoses:  Nausea vomiting and diarrhea  Generalized abdominal pain    New Prescriptions Discharge Medication List as of 12/15/2016  9:02 PM    START taking these medications   Details  ondansetron (ZOFRAN) 4 MG tablet Take 1 tablet (4 mg total) by mouth every 6 (six) hours., Starting Mon 12/15/2016, Print    oseltamivir (TAMIFLU) 75 MG capsule  Take 1 capsule (75 mg total) by mouth every 12 (twelve) hours., Starting Mon 12/15/2016, Print       I personally performed the services described in this documentation, which was scribed in my presence. The recorded information has been reviewed and is accurate.    Frederica Kuster, PA-C 12/16/16 [place], MD 12/18/16 2205

## 2016-12-15 NOTE — Discharge Instructions (Signed)
Medications: Tamiflu, Zofran  Treatment: Take Tamiflu as prescribed for 5 days. This medicine can shorten the course of the flu. Take Zofran every 6 hours as needed for nausea and vomiting. Drink plenty of fluids and get plenty of rest.  Follow-up: Please see your primary care provider in 2 days for recheck of symptoms. Please return to emergency department if you develop any new or worsening symptoms.

## 2016-12-15 NOTE — ED Notes (Signed)
Ambulated unassisted, slow cautious gait, reports some light headedness, using rail and wall to steady self, "do not feel like I'll fall".

## 2016-12-15 NOTE — ED Triage Notes (Signed)
C/o n/v/d x today-vomiting in traige

## 2017-01-22 ENCOUNTER — Emergency Department (HOSPITAL_BASED_OUTPATIENT_CLINIC_OR_DEPARTMENT_OTHER): Payer: BLUE CROSS/BLUE SHIELD

## 2017-01-22 ENCOUNTER — Emergency Department (HOSPITAL_BASED_OUTPATIENT_CLINIC_OR_DEPARTMENT_OTHER)
Admission: EM | Admit: 2017-01-22 | Discharge: 2017-01-22 | Disposition: A | Discharge status: Home / Self Care | Payer: BLUE CROSS/BLUE SHIELD | Attending: Emergency Medicine | Admitting: Emergency Medicine

## 2017-01-22 ENCOUNTER — Encounter (HOSPITAL_BASED_OUTPATIENT_CLINIC_OR_DEPARTMENT_OTHER): Payer: Self-pay | Admitting: Emergency Medicine

## 2017-01-22 DIAGNOSIS — J45909 Unspecified asthma, uncomplicated: Secondary | ICD-10-CM | POA: Insufficient documentation

## 2017-01-22 DIAGNOSIS — R1031 Right lower quadrant pain: Secondary | ICD-10-CM

## 2017-01-22 DIAGNOSIS — R112 Nausea with vomiting, unspecified: Secondary | ICD-10-CM | POA: Diagnosis not present

## 2017-01-22 DIAGNOSIS — F17210 Nicotine dependence, cigarettes, uncomplicated: Secondary | ICD-10-CM | POA: Diagnosis not present

## 2017-01-22 DIAGNOSIS — M791 Myalgia: Secondary | ICD-10-CM | POA: Insufficient documentation

## 2017-01-22 DIAGNOSIS — Z79899 Other long term (current) drug therapy: Secondary | ICD-10-CM | POA: Insufficient documentation

## 2017-01-22 LAB — COMPREHENSIVE METABOLIC PANEL
ALK PHOS: 82 U/L (ref 38–126)
ALT: 94 U/L — AB (ref 17–63)
AST: 83 U/L — AB (ref 15–41)
Albumin: 4.8 g/dL (ref 3.5–5.0)
Anion gap: 7 (ref 5–15)
BILIRUBIN TOTAL: 0.4 mg/dL (ref 0.3–1.2)
BUN: 11 mg/dL (ref 6–20)
CHLORIDE: 102 mmol/L (ref 101–111)
CO2: 28 mmol/L (ref 22–32)
Calcium: 9.8 mg/dL (ref 8.9–10.3)
Creatinine, Ser: 0.94 mg/dL (ref 0.61–1.24)
Glucose, Bld: 120 mg/dL — ABNORMAL HIGH (ref 65–99)
Potassium: 3.5 mmol/L (ref 3.5–5.1)
Sodium: 137 mmol/L (ref 135–145)
Total Protein: 7.9 g/dL (ref 6.5–8.1)

## 2017-01-22 LAB — CBC WITH DIFFERENTIAL/PLATELET
BASOS PCT: 1 %
Basophils Absolute: 0.1 10*3/uL (ref 0.0–0.1)
EOS PCT: 3 %
Eosinophils Absolute: 0.2 10*3/uL (ref 0.0–0.7)
HEMATOCRIT: 46.7 % (ref 39.0–52.0)
HEMOGLOBIN: 17 g/dL (ref 13.0–17.0)
Lymphocytes Relative: 19 %
Lymphs Abs: 1.2 10*3/uL (ref 0.7–4.0)
MCH: 29.5 pg (ref 26.0–34.0)
MCHC: 36.4 g/dL — ABNORMAL HIGH (ref 30.0–36.0)
MCV: 80.9 fL (ref 78.0–100.0)
MONOS PCT: 6 %
Monocytes Absolute: 0.4 10*3/uL (ref 0.1–1.0)
Neutro Abs: 4.2 10*3/uL (ref 1.7–7.7)
Neutrophils Relative %: 71 %
Platelets: 118 10*3/uL — ABNORMAL LOW (ref 150–400)
RBC: 5.77 MIL/uL (ref 4.22–5.81)
RDW: 15 % (ref 11.5–15.5)
WBC: 6.1 10*3/uL (ref 4.0–10.5)

## 2017-01-22 LAB — LIPASE, BLOOD: Lipase: 26 U/L (ref 11–51)

## 2017-01-22 LAB — URINALYSIS, ROUTINE W REFLEX MICROSCOPIC
Bilirubin Urine: NEGATIVE
GLUCOSE, UA: NEGATIVE mg/dL
HGB URINE DIPSTICK: NEGATIVE
KETONES UR: NEGATIVE mg/dL
LEUKOCYTES UA: NEGATIVE
Nitrite: NEGATIVE
PROTEIN: NEGATIVE mg/dL
Specific Gravity, Urine: 1.021 (ref 1.005–1.030)
pH: 6 (ref 5.0–8.0)

## 2017-01-22 IMAGING — CT CT ABD-PELV W/ CM
2 of 5 series · 15 of 46 positions shown, 17 images · IV contrast (APPLIED)
Comparison: CT scan [DATE]

CLINICAL DATA: Lower abdominal pain for 2 days, worsening pain
yesterday and today extending in right groin and right testicle

EXAM:
CT ABDOMEN AND PELVIS WITH CONTRAST
TECHNIQUE: Multidetector CT imaging of the abdomen and pelvis was performed
using the standard protocol following bolus administration of
intravenous contrast.
CONTRAST:  100mL [FB] IOPAMIDOL ([FB]) INJECTION 61%

[Series 2: axial st · axial · 0.88mm/px · z∈[-576,-56]mm · 12 of 122 slices shown, 14 images]
[im 9/122  soft-tissue]
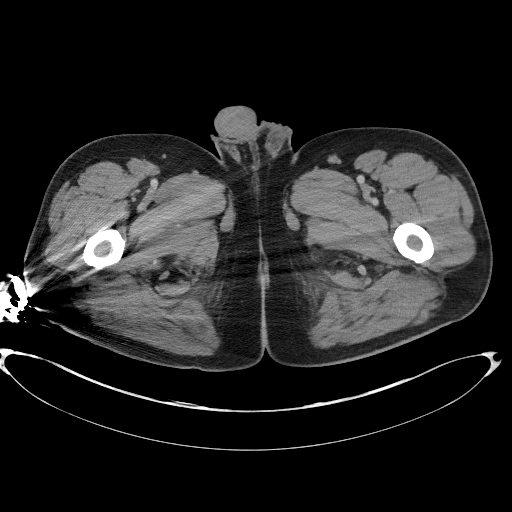
[im 9/122  bone]
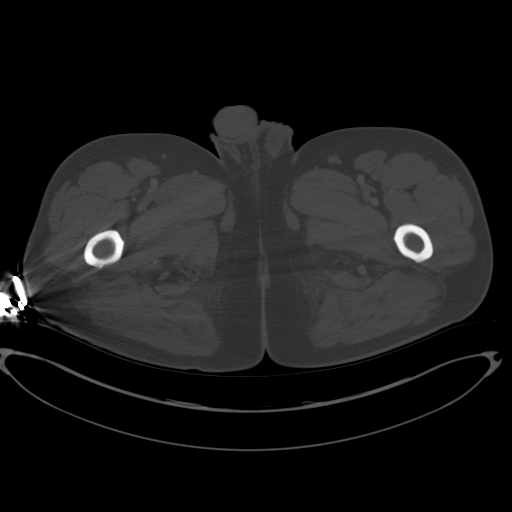
[im 17/122  soft-tissue]
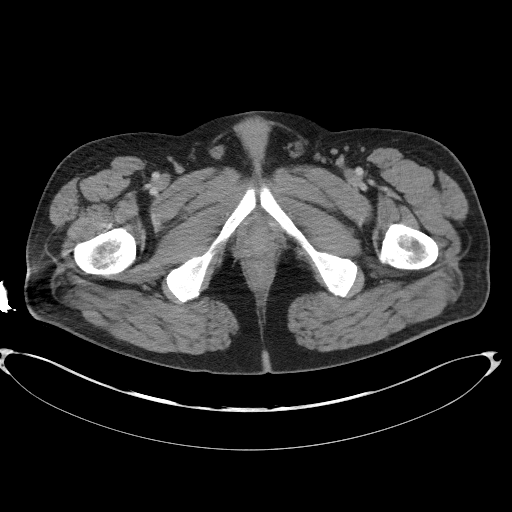
[im 25/122  soft-tissue]
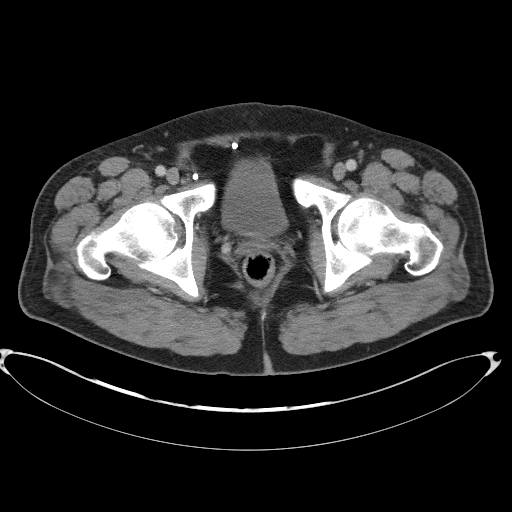
[im 41/122  soft-tissue]
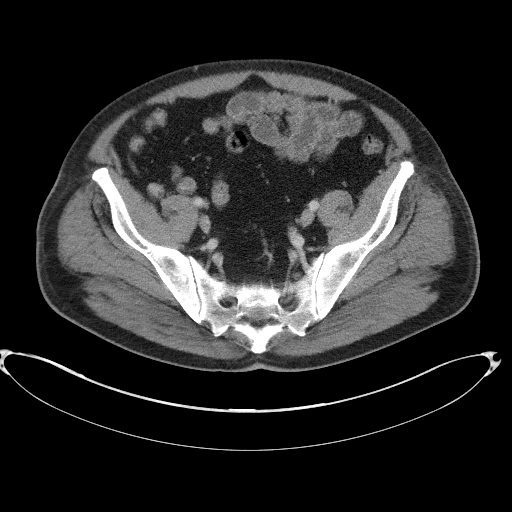
[im 49/122  soft-tissue]
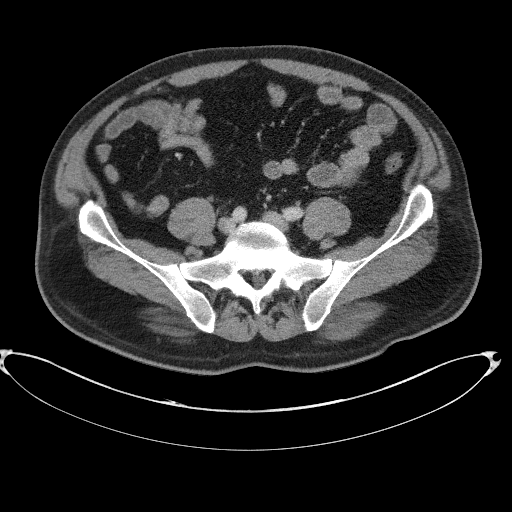
[im 57/122  soft-tissue]
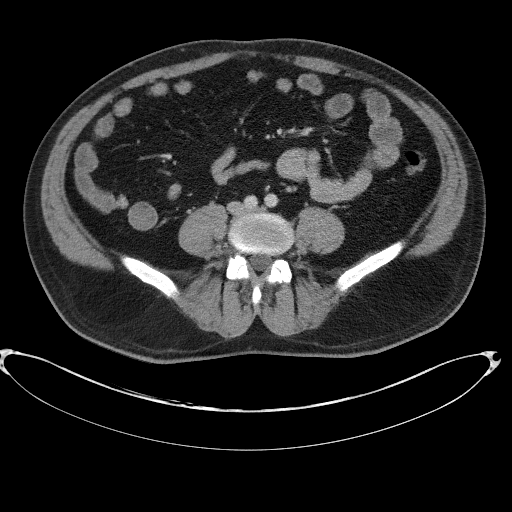
[im 65/122  soft-tissue]
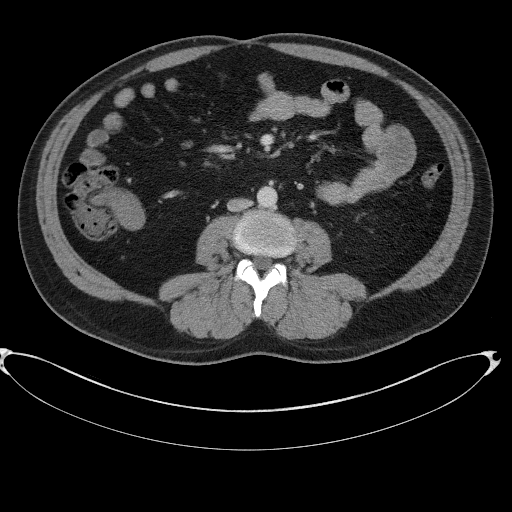
[im 73/122  soft-tissue]
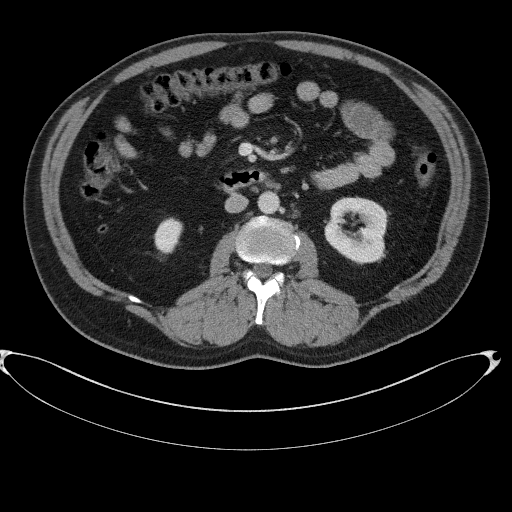
[im 81/122  soft-tissue]
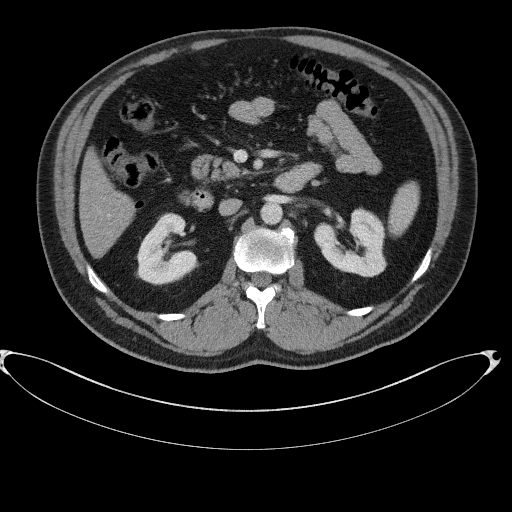
[im 81/122  bone]
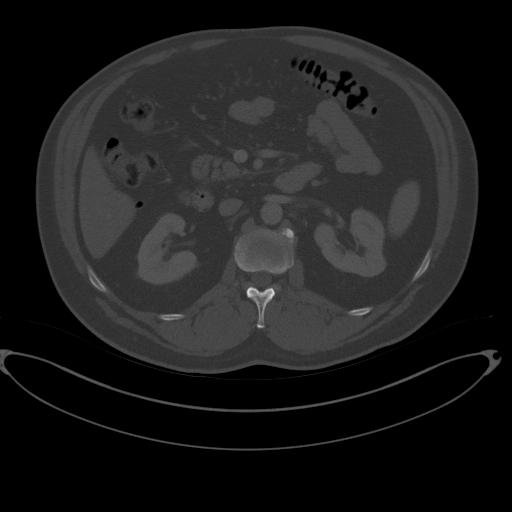
[im 97/122  soft-tissue]
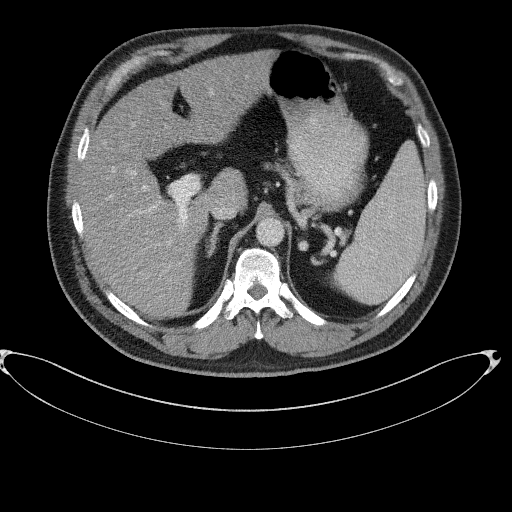
[im 105/122  soft-tissue]
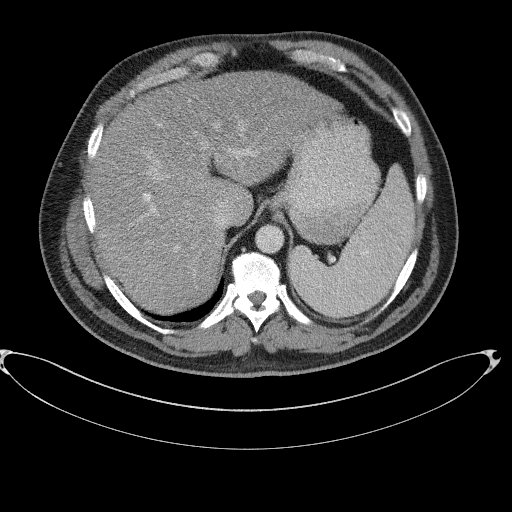
[im 113/122  soft-tissue]
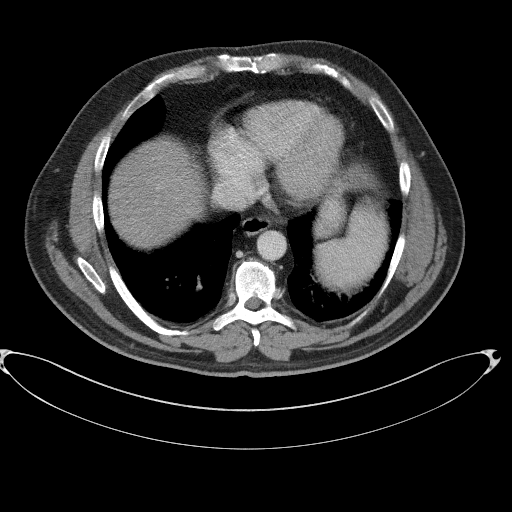

[Series 5: coronal st · coronal · 0.94mm/px · 3 of 109 slices shown]
[im 37/109  soft-tissue]
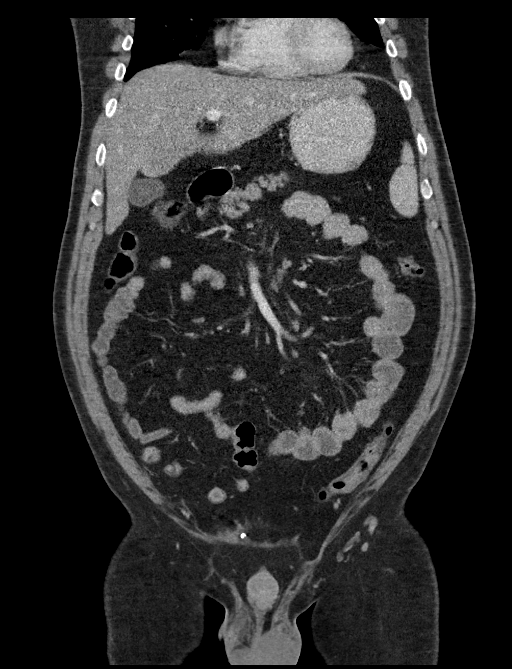
[im 49/109  soft-tissue]
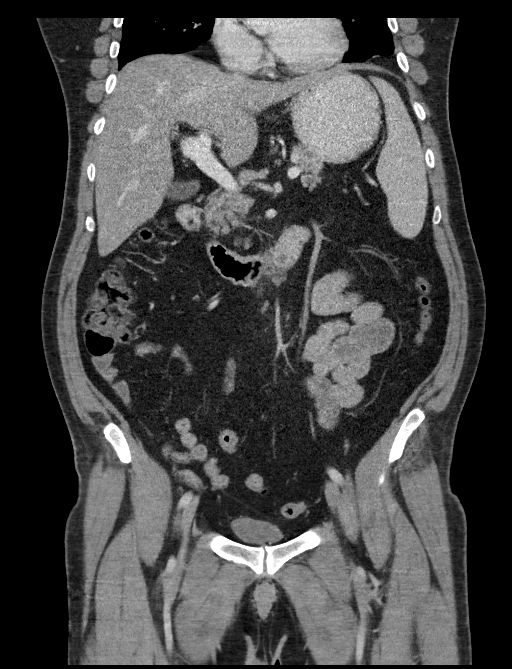
[im 61/109  soft-tissue]
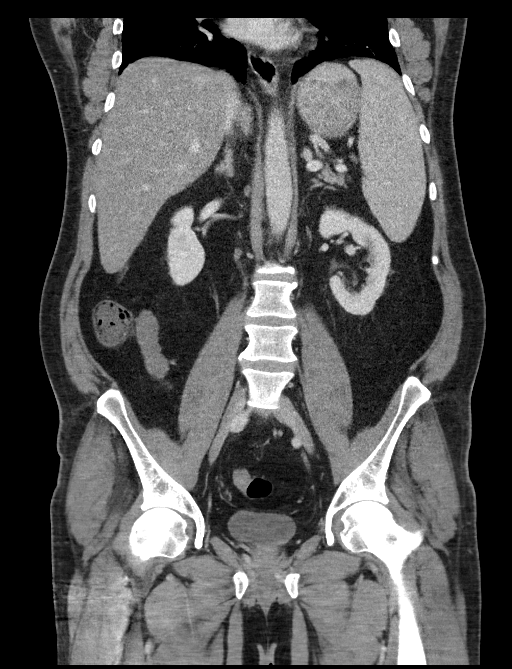

[15 of 46 positions shown; findings below may reference images not displayed]

FINDINGS: Lower chest: Lung bases shows no acute findings.

Hepatobiliary: Again noted fatty infiltration of the liver. No focal
hepatic mass. No calcified gallstones are noted within gallbladder.

Pancreas: Unremarkable. No pancreatic ductal dilatation or
surrounding inflammatory changes.

Spleen: Splenomegaly is stable.  No focal splenic mass.

Adrenals/Urinary Tract: No adrenal gland mass. Enhanced kidneys are
symmetrical in size. No hydronephrosis or hydroureter. No calcified
ureteral calculi are noted. Delayed renal images shows bilateral
renal symmetrical excretion. Bilateral visualized proximal ureter is
unremarkable. The urinary bladder is under distended grossly
unremarkable.

Stomach/Bowel: There is no small bowel obstruction. No thickened or
dilated small bowel loops. The terminal ileum is unremarkable. No
pericecal inflammation. Normal retrocecal appendix noted in axial
image 54. No distal colonic obstruction. No evidence of colitis or
diverticulitis.

Vascular/Lymphatic: Few small nonspecific mesenteric lymph nodes
without adenopathy. No aortic aneurysm. No retroperitoneal
adenopathy.

Reproductive: Prostate gland and seminal vesicles are unremarkable.

Other: No ascites or free abdominal air. Stable postsurgical changes
post hernia repair in right inguinal region. No evidence of
recurrent hernia. There is no evidence of inguinal adenopathy. No
focal inflammatory process is noted bilateral inguinal region.
Stable tiny left inguinal hernia containing fat measures 1.1 cm
without evidence of acute complication. The visualized upper scrotum
is unremarkable.

Musculoskeletal: No destructive bony lesions are noted. Sagittal
images of the spine shows mild degenerative changes lower thoracic
spine. Minimal degenerative changes lumbar spine. Alignment and
vertebral body height appears
IMPRESSION: 1. There is no evidence of acute inflammatory process within
abdomen.
2. Again noted fatty infiltration of the liver. Stable splenomegaly.
3. No hydronephrosis or hydroureter.
4. Normal appendix.  No pericecal inflammation.
5. No small bowel obstruction. No evidence of colitis or
diverticulitis.
6. Stable post hernia repair changes in right inguinal region.
Stable small left inguinal hernia containing fat without evidence of
acute complication. Unremarkable visualized upper scrotum.
7. No ascites or free abdominal air.

## 2017-01-22 MED ORDER — HYDROMORPHONE HCL 1 MG/ML IJ SOLN
1.0000 mg | Freq: Once | INTRAMUSCULAR | Status: AC
  Administered 2017-01-22: 1 mg via INTRAVENOUS
  Filled 2017-01-22: qty 1

## 2017-01-22 MED ORDER — IOPAMIDOL (ISOVUE-300) INJECTION 61%
100.0000 mL | Freq: Once | INTRAVENOUS | Status: AC | PRN
  Administered 2017-01-22: 100 mL via INTRAVENOUS

## 2017-01-22 MED ORDER — SODIUM CHLORIDE 0.9 % IV SOLN
INTRAVENOUS | Status: DC
  Administered 2017-01-22: 12:00:00 via INTRAVENOUS

## 2017-01-22 MED ORDER — DICYCLOMINE HCL 20 MG PO TABS: 20.0000 mg | ORAL_TABLET | Freq: Two times a day (BID) | ORAL | 0 refills | Status: DC

## 2017-01-22 MED ORDER — ONDANSETRON HCL 4 MG/2ML IJ SOLN
4.0000 mg | Freq: Once | INTRAMUSCULAR | Status: AC
  Administered 2017-01-22: 4 mg via INTRAVENOUS
  Filled 2017-01-22: qty 2

## 2017-01-22 MED FILL — DICYCLOMINE 20 MG TABLET: 20 | 10 days supply | Qty: 20 | Fill #0

## 2017-01-22 NOTE — ED Notes (Signed)
ED Provider at bedside. 

## 2017-01-22 NOTE — ED Provider Notes (Signed)
[facility] Provider Note   CSN: [id number] Arrival date & time: 01/22/17  1051     History   Chief Complaint No chief complaint on file.   HPI Alan Sanders is a 46 y.o. male.  46 year old male presents with 2 days of right lower quadrant abdominal pain which she relates to after eating bad hamburger meat. He has had nausea and vomiting as well as myalgias but has not taken his temperature. No diarrhea noted. Pain characterized as sharp and persistent and worse with walking. No prior history of same. No treatment use prior to arrival.      Past Medical History:  Diagnosis Date  . Arthritis    " in my back "  . Asthma   . GERD (gastroesophageal reflux disease)   . Seizures ([place])    " its been along time ,since my last seizure "  . Spleen enlarged   . Thrombocytopenia ([place])   . Tobacco abuse     Patient Active Problem List   Diagnosis Date Noted  . Cough 02/13/2015  . Thrombocytopenia ([place]) 02/13/2015  . Chest pain 02/12/2015  . Asthma 02/12/2015  . Tobacco abuse 02/12/2015  . Nausea vomiting and diarrhea 02/12/2015  . Pain in the chest     Past Surgical History:  Procedure Laterality Date  . HERNIA REPAIR         Home Medications    Prior to Admission medications   Medication Sig Start Date End Date Taking? Authorizing Provider  albuterol (PROVENTIL HFA;VENTOLIN HFA) 108 (90 BASE) MCG/ACT inhaler Inhale 2 puffs into the lungs every 6 (six) hours as needed for wheezing or shortness of breath. 02/14/15   Oswald Hillock, MD  ondansetron (ZOFRAN) 4 MG tablet Take 1 tablet (4 mg total) by mouth every 6 (six) hours. 12/15/16   Frederica Kuster, PA-C  oseltamivir (TAMIFLU) 75 MG capsule Take 1 capsule (75 mg total) by mouth every 12 (twelve) hours. 12/15/16   Frederica Kuster, PA-C    Family History Family History  Problem Relation Age of Onset  . Diabetes Mother   . Hypertension Mother   . COPD Mother     Social History Social History    Substance Use Topics  . Smoking status: Current Every Day Smoker    Packs/day: 0.50    Years: 24.00    Types: Cigarettes  . Smokeless tobacco: Never Used  . Alcohol use No     Allergies   Patient has no known allergies.   Review of Systems Review of Systems  All other systems reviewed and are negative.    Physical Exam Updated Vital Signs There were no vitals taken for this visit.  Physical Exam  Constitutional: He is oriented to person, place, and time. He appears well-developed and well-nourished.  Non-toxic appearance. No distress.  HENT:  Head: Normocephalic and atraumatic.  Eyes: Conjunctivae, EOM and lids are normal. Pupils are equal, round, and reactive to light.  Neck: Normal range of motion. Neck supple. No tracheal deviation present. No thyroid mass present.  Cardiovascular: Normal rate, regular rhythm and normal heart sounds.  Exam reveals no gallop.   No murmur heard. Pulmonary/Chest: Effort normal and breath sounds normal. No stridor. No respiratory distress. He has no decreased breath sounds. He has no wheezes. He has no rhonchi. He has no rales.  Abdominal: Soft. Normal appearance and bowel sounds are normal. He exhibits no distension. There is tenderness in the right lower quadrant. There is no  rebound and no CVA tenderness.    Musculoskeletal: Normal range of motion. He exhibits no edema or tenderness.  Neurological: He is alert and oriented to person, place, and time. He has normal strength. No cranial nerve deficit or sensory deficit. GCS eye subscore is 4. GCS verbal subscore is 5. GCS motor subscore is 6.  Skin: Skin is warm and dry. No abrasion and no rash noted.  Psychiatric: He has a normal mood and affect. His speech is normal and behavior is normal.  Nursing note and vitals reviewed.    ED Treatments / Results  Labs (all labs ordered are listed, but only abnormal results are displayed) Labs Reviewed - No data to display  EKG  EKG  Interpretation None       Radiology No results found.  Procedures Procedures (including critical care time)  Medications Ordered in ED Medications - No data to display   Initial Impression / Assessment and Plan / ED Course  I have reviewed the triage vital signs and the nursing notes.  Pertinent labs & imaging results that were available during my care of the patient were reviewed by me and considered in my medical decision making (see chart for details).     Patient treated for pain here and feels better abdominal CT results reviewed. Will treat for chronic spasms and discharged home and return precautions given  Final Clinical Impressions(s) / ED Diagnoses   Final diagnoses:  None    New Prescriptions New Prescriptions   No medications on file     Lacretia Leigh, MD 01/22/17 1318

## 2017-01-22 NOTE — ED Triage Notes (Signed)
"  I think I ate some bad hamburger meat" Abd pain x 2 days, worse today, pain radiates to right groin/testicle , denies back pain or urinary s/s .

## 2017-02-04 ENCOUNTER — Encounter (HOSPITAL_BASED_OUTPATIENT_CLINIC_OR_DEPARTMENT_OTHER): Payer: Self-pay

## 2017-02-04 ENCOUNTER — Emergency Department (HOSPITAL_BASED_OUTPATIENT_CLINIC_OR_DEPARTMENT_OTHER)
Admission: EM | Admit: 2017-02-04 | Discharge: 2017-02-04 | Disposition: A | Discharge status: Home / Self Care | Payer: Worker's Compensation | Attending: Emergency Medicine | Admitting: Emergency Medicine

## 2017-02-04 DIAGNOSIS — Y999 Unspecified external cause status: Secondary | ICD-10-CM | POA: Diagnosis not present

## 2017-02-04 DIAGNOSIS — Y929 Unspecified place or not applicable: Secondary | ICD-10-CM | POA: Insufficient documentation

## 2017-02-04 DIAGNOSIS — X501XXA Overexertion from prolonged static or awkward postures, initial encounter: Secondary | ICD-10-CM | POA: Diagnosis not present

## 2017-02-04 DIAGNOSIS — F17210 Nicotine dependence, cigarettes, uncomplicated: Secondary | ICD-10-CM | POA: Diagnosis not present

## 2017-02-04 DIAGNOSIS — J45909 Unspecified asthma, uncomplicated: Secondary | ICD-10-CM | POA: Diagnosis not present

## 2017-02-04 DIAGNOSIS — Y9389 Activity, other specified: Secondary | ICD-10-CM | POA: Diagnosis not present

## 2017-02-04 DIAGNOSIS — S39012A Strain of muscle, fascia and tendon of lower back, initial encounter: Secondary | ICD-10-CM | POA: Diagnosis not present

## 2017-02-04 DIAGNOSIS — S3992XA Unspecified injury of lower back, initial encounter: Secondary | ICD-10-CM | POA: Diagnosis present

## 2017-02-04 MED ORDER — METHOCARBAMOL 500 MG PO TABS: 1000.0000 mg | ORAL_TABLET | Freq: Three times a day (TID) | ORAL | 0 refills | Status: DC | PRN

## 2017-02-04 MED ORDER — KETOROLAC TROMETHAMINE 60 MG/2ML IM SOLN
60.0000 mg | Freq: Once | INTRAMUSCULAR | Status: AC
  Administered 2017-02-04: 60 mg via INTRAMUSCULAR
  Filled 2017-02-04: qty 2

## 2017-02-04 MED ORDER — IBUPROFEN 600 MG PO TABS: 600.0000 mg | ORAL_TABLET | Freq: Four times a day (QID) | ORAL | 0 refills | Status: DC | PRN

## 2017-02-04 NOTE — ED Triage Notes (Signed)
C/o lower back pain after bending over to tie shoe yesterday-NAD-steady gait

## 2017-02-04 NOTE — ED Provider Notes (Signed)
[facility] Provider Note   CSN: [id number] Arrival date & time: 02/04/17  2118  By signing my name below, I, Alan Sanders, attest that this documentation has been prepared under the direction and in the presence of Alan Rice, MD. Electronically Signed: Gwenlyn Sanders, ED Scribe. 02/04/17. 9:44 PM.   History   Chief Complaint Chief Complaint  Patient presents with  . Back Pain   The history is provided by the patient. No language interpreter was used.   HPI Comments: Alan Sanders is a 46 y.o. male who presents to the Emergency Department complaining of acute onset, gradually worsening, constant, moderate lower back pain for 1 day. The patient states the pain began after bending over to tie his shoe last night. Pain does not radiate down his legs or into his buttocks. No treatments tried at home. Pt denies dysuria, hematuria, frequency, urinary incontinence, numbness or weakness.  Past Medical History:  Diagnosis Date  . Arthritis    " in my back "  . Asthma   . GERD (gastroesophageal reflux disease)   . Seizures ([place])    " its been along time ,since my last seizure "  . Spleen enlarged   . Thrombocytopenia ([place])   . Tobacco abuse     Patient Active Problem List   Diagnosis Date Noted  . Cough 02/13/2015  . Thrombocytopenia ([place]) 02/13/2015  . Chest pain 02/12/2015  . Asthma 02/12/2015  . Tobacco abuse 02/12/2015  . Nausea vomiting and diarrhea 02/12/2015  . Pain in the chest     Past Surgical History:  Procedure Laterality Date  . HERNIA REPAIR         Home Medications    Prior to Admission medications   Medication Sig Start Date End Date Taking? Authorizing Provider  ibuprofen (ADVIL,MOTRIN) 600 MG tablet Take 1 tablet (600 mg total) by mouth every 6 (six) hours as needed. 02/04/17   Alan Rice, MD  methocarbamol (ROBAXIN) 500 MG tablet Take 2 tablets (1,000 mg total) by mouth every 8 (eight) hours as needed for muscle spasms. 02/04/17    Alan Rice, MD    Family History Family History  Problem Relation Age of Onset  . Diabetes Mother   . Hypertension Mother   . COPD Mother     Social History Social History  Substance Use Topics  . Smoking status: Current Every Day Smoker    Packs/day: 0.50    Years: 24.00    Types: Cigarettes  . Smokeless tobacco: Never Used  . Alcohol use No     Allergies   Patient has no known allergies.   Review of Systems Review of Systems  Constitutional: Negative for chills, fatigue and fever.  Respiratory: Negative for cough and shortness of breath.   Cardiovascular: Negative for chest pain.  Gastrointestinal: Negative for abdominal pain, diarrhea, nausea and vomiting.  Genitourinary: Negative for difficulty urinating, dysuria, frequency and hematuria.       No urinary incontinence  Musculoskeletal: Positive for back pain and myalgias. Negative for joint swelling, neck pain and neck stiffness.  Skin: Negative for rash and wound.  Neurological: Negative for weakness and numbness.  All other systems reviewed and are negative.  Physical Exam Updated Vital Signs BP (!) 141/101 (BP Location: Left Arm)   Pulse 77   Temp 97.6 F (36.4 C) (Oral)   Resp 21   Ht 6\' 1"  (1.854 m)   Wt 230 lb (104.3 kg)   SpO2 100%   BMI 30.34 kg/m  Physical Exam  Constitutional: He is oriented to person, place, and time. He appears well-developed and well-nourished.  HENT:  Head: Normocephalic and atraumatic.  Mouth/Throat: Oropharynx is clear and moist.  Eyes: EOM are normal. Pupils are equal, round, and reactive to light.  Neck: Normal range of motion. Neck supple.  Cardiovascular: Normal rate and regular rhythm.   Pulmonary/Chest: Effort normal and breath sounds normal.  Abdominal: Soft. Bowel sounds are normal. There is no tenderness. There is no rebound and no guarding.  Musculoskeletal: Normal range of motion. He exhibits tenderness. He exhibits no edema.  No midline thoracic or  lumbar tenderness. Patient does have left-sided lumbar paraspinal tenderness to palpation. Negative straight leg raise. 2+ distal pulses. No lower extremity swelling, asymmetry or tenderness.  Neurological: He is alert and oriented to person, place, and time.  Patient is alert and oriented x3 with clear, goal oriented speech. Patient has 5/5 motor in all extremities. Sensation is intact to light touch. Patient has a normal gait and walks without assistance.  Skin: Skin is warm and dry. No rash noted. No erythema.  Psychiatric: He has a normal mood and affect. His behavior is normal.  Nursing note and vitals reviewed.    ED Treatments / Results  DIAGNOSTIC STUDIES: Oxygen Saturation is 100% on RA, normal by my interpretation.    COORDINATION OF CARE: 9:43 PM Discussed treatment plan with pt at bedside which includes Toradol and pt agreed to plan.  Labs (all labs ordered are listed, but only abnormal results are displayed) Labs Reviewed - No data to display  EKG  EKG Interpretation None       Radiology No results found.  Procedures Procedures (including critical care time)  Medications Ordered in ED Medications  ketorolac (TORADOL) injection 60 mg (60 mg Intramuscular Given 02/04/17 2204)     Initial Impression / Assessment and Plan / ED Course  I have reviewed the triage vital signs and the nursing notes.  Pertinent labs & imaging results that were available during my care of the patient were reviewed by me and considered in my medical decision making (see chart for details).     I personally performed the services described in this documentation, which was scribed in my presence. The recorded information has been reviewed and is accurate.   No red flag signs or symptoms. Pain appears to be reproduced with palpation of the paraspinal muscles. Suspect muscular strain. Will treat symptomatically. Return precautions given.  Final Clinical Impressions(s) / ED Diagnoses    Final diagnoses:  Strain of lumbar region, initial encounter    New Prescriptions Discharge Medication List as of 02/04/2017 10:10 PM    START taking these medications   Details  ibuprofen (ADVIL,MOTRIN) 600 MG tablet Take 1 tablet (600 mg total) by mouth every 6 (six) hours as needed., Starting Wed 02/04/2017, Print    methocarbamol (ROBAXIN) 500 MG tablet Take 2 tablets (1,000 mg total) by mouth every 8 (eight) hours as needed for muscle spasms., Starting Wed 02/04/2017, Print         Alan Rice, MD 02/06/17 [phone]

## 2017-04-27 ENCOUNTER — Emergency Department (HOSPITAL_COMMUNITY): Payer: BLUE CROSS/BLUE SHIELD

## 2017-04-27 ENCOUNTER — Emergency Department (HOSPITAL_COMMUNITY)
Admission: EM | Admit: 2017-04-27 | Discharge: 2017-04-27 | Disposition: A | Discharge status: Home / Self Care | Payer: BLUE CROSS/BLUE SHIELD | Attending: Emergency Medicine | Admitting: Emergency Medicine

## 2017-04-27 DIAGNOSIS — F17210 Nicotine dependence, cigarettes, uncomplicated: Secondary | ICD-10-CM | POA: Insufficient documentation

## 2017-04-27 DIAGNOSIS — S39012A Strain of muscle, fascia and tendon of lower back, initial encounter: Secondary | ICD-10-CM | POA: Insufficient documentation

## 2017-04-27 DIAGNOSIS — Y9389 Activity, other specified: Secondary | ICD-10-CM | POA: Insufficient documentation

## 2017-04-27 DIAGNOSIS — Z79899 Other long term (current) drug therapy: Secondary | ICD-10-CM | POA: Insufficient documentation

## 2017-04-27 DIAGNOSIS — Y999 Unspecified external cause status: Secondary | ICD-10-CM | POA: Insufficient documentation

## 2017-04-27 DIAGNOSIS — Y929 Unspecified place or not applicable: Secondary | ICD-10-CM | POA: Insufficient documentation

## 2017-04-27 DIAGNOSIS — W228XXA Striking against or struck by other objects, initial encounter: Secondary | ICD-10-CM | POA: Insufficient documentation

## 2017-04-27 DIAGNOSIS — J45909 Unspecified asthma, uncomplicated: Secondary | ICD-10-CM | POA: Insufficient documentation

## 2017-04-27 LAB — URINALYSIS, ROUTINE W REFLEX MICROSCOPIC
BACTERIA UA: NONE SEEN
Bilirubin Urine: NEGATIVE
Glucose, UA: NEGATIVE mg/dL
Hgb urine dipstick: NEGATIVE
Ketones, ur: NEGATIVE mg/dL
Nitrite: NEGATIVE
Protein, ur: NEGATIVE mg/dL
SPECIFIC GRAVITY, URINE: 1.012 (ref 1.005–1.030)
pH: 5 (ref 5.0–8.0)

## 2017-04-27 LAB — CBC
HCT: 47.3 % (ref 39.0–52.0)
Hemoglobin: 17.1 g/dL — ABNORMAL HIGH (ref 13.0–17.0)
MCH: 29.6 pg (ref 26.0–34.0)
MCHC: 36.2 g/dL — ABNORMAL HIGH (ref 30.0–36.0)
MCV: 82 fL (ref 78.0–100.0)
PLATELETS: 126 10*3/uL — AB (ref 150–400)
RBC: 5.77 MIL/uL (ref 4.22–5.81)
RDW: 13.6 % (ref 11.5–15.5)
WBC: 7 10*3/uL (ref 4.0–10.5)

## 2017-04-27 LAB — COMPREHENSIVE METABOLIC PANEL
ALT: 62 U/L (ref 17–63)
AST: 46 U/L — AB (ref 15–41)
Albumin: 5.1 g/dL — ABNORMAL HIGH (ref 3.5–5.0)
Alkaline Phosphatase: 82 U/L (ref 38–126)
Anion gap: 8 (ref 5–15)
BUN: 10 mg/dL (ref 6–20)
CHLORIDE: 102 mmol/L (ref 101–111)
CO2: 27 mmol/L (ref 22–32)
CREATININE: 0.91 mg/dL (ref 0.61–1.24)
Calcium: 9.4 mg/dL (ref 8.9–10.3)
GFR calc non Af Amer: >60 mL/min (ref >60)
Glucose, Bld: 123 mg/dL — ABNORMAL HIGH (ref 65–99)
Potassium: 3.8 mmol/L (ref 3.5–5.1)
SODIUM: 137 mmol/L (ref 135–145)
Total Bilirubin: 0.7 mg/dL (ref 0.3–1.2)
Total Protein: 8.3 g/dL — ABNORMAL HIGH (ref 6.5–8.1)

## 2017-04-27 LAB — LIPASE, BLOOD: LIPASE: 65 U/L — AB (ref 11–51)

## 2017-04-27 IMAGING — CR DG LUMBAR SPINE COMPLETE 4+V
5 series · 5 of 5 positions shown · non-contrast
Comparison: [DATE] CT

CLINICAL DATA: Low back pain

EXAM:
LUMBAR SPINE - COMPLETE 4+ VIEW

[t lumbar spine ap]
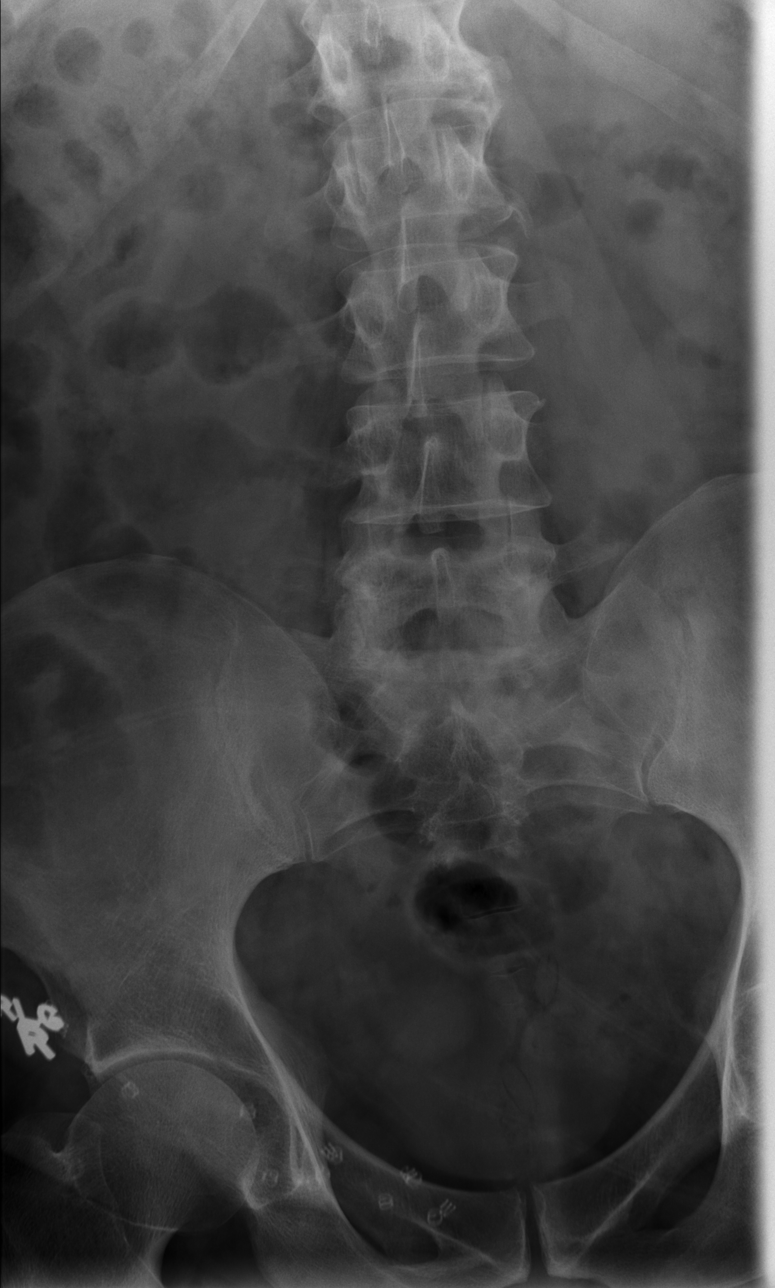

[t lumbar spine obl (1 of 2)]
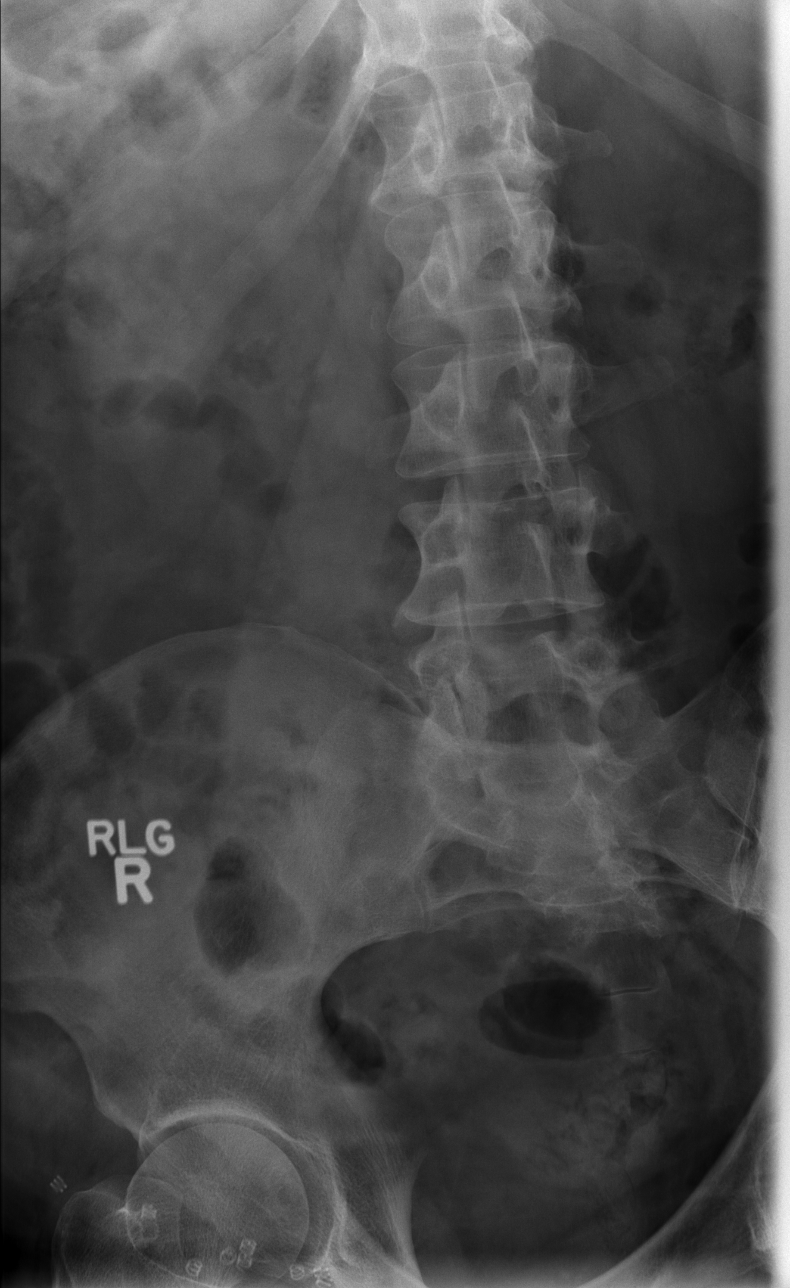

[t lumbar spine obl (2 of 2)]
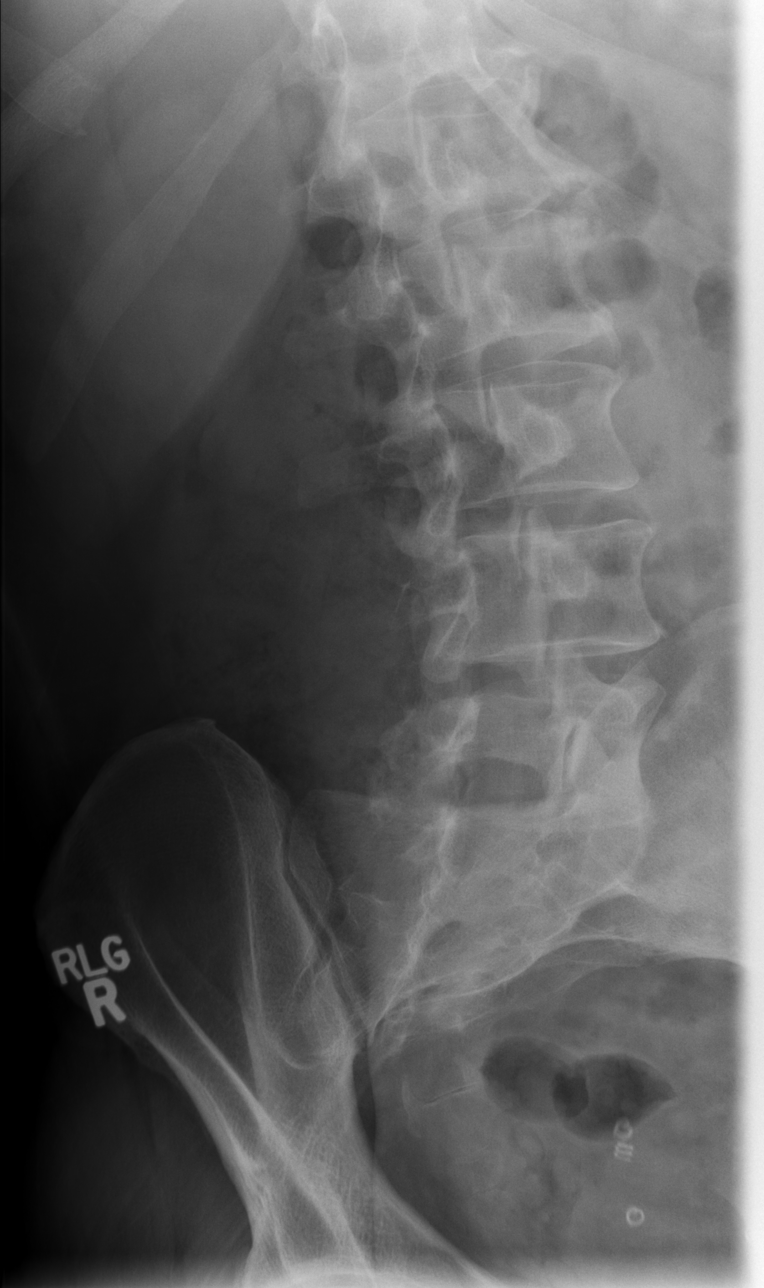

[t lumbar spine lat]
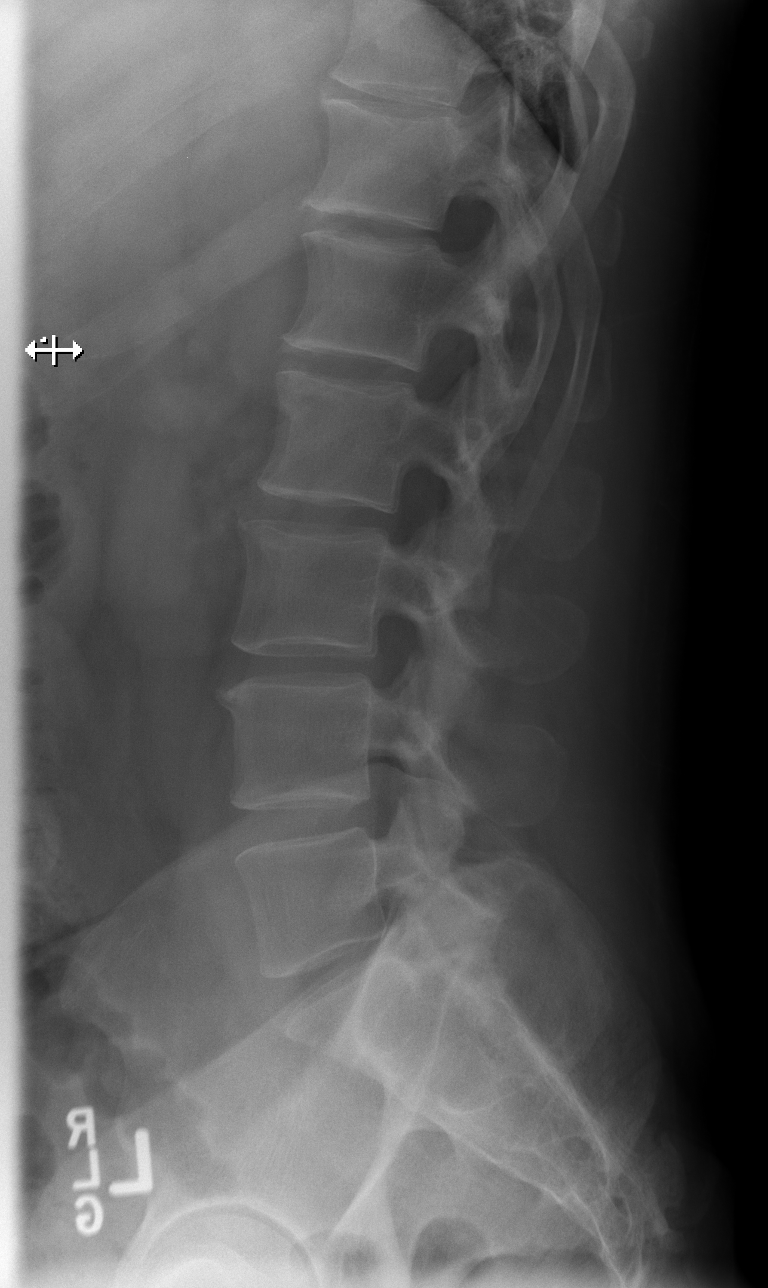

[t lumbar l-5 s-1 spot]
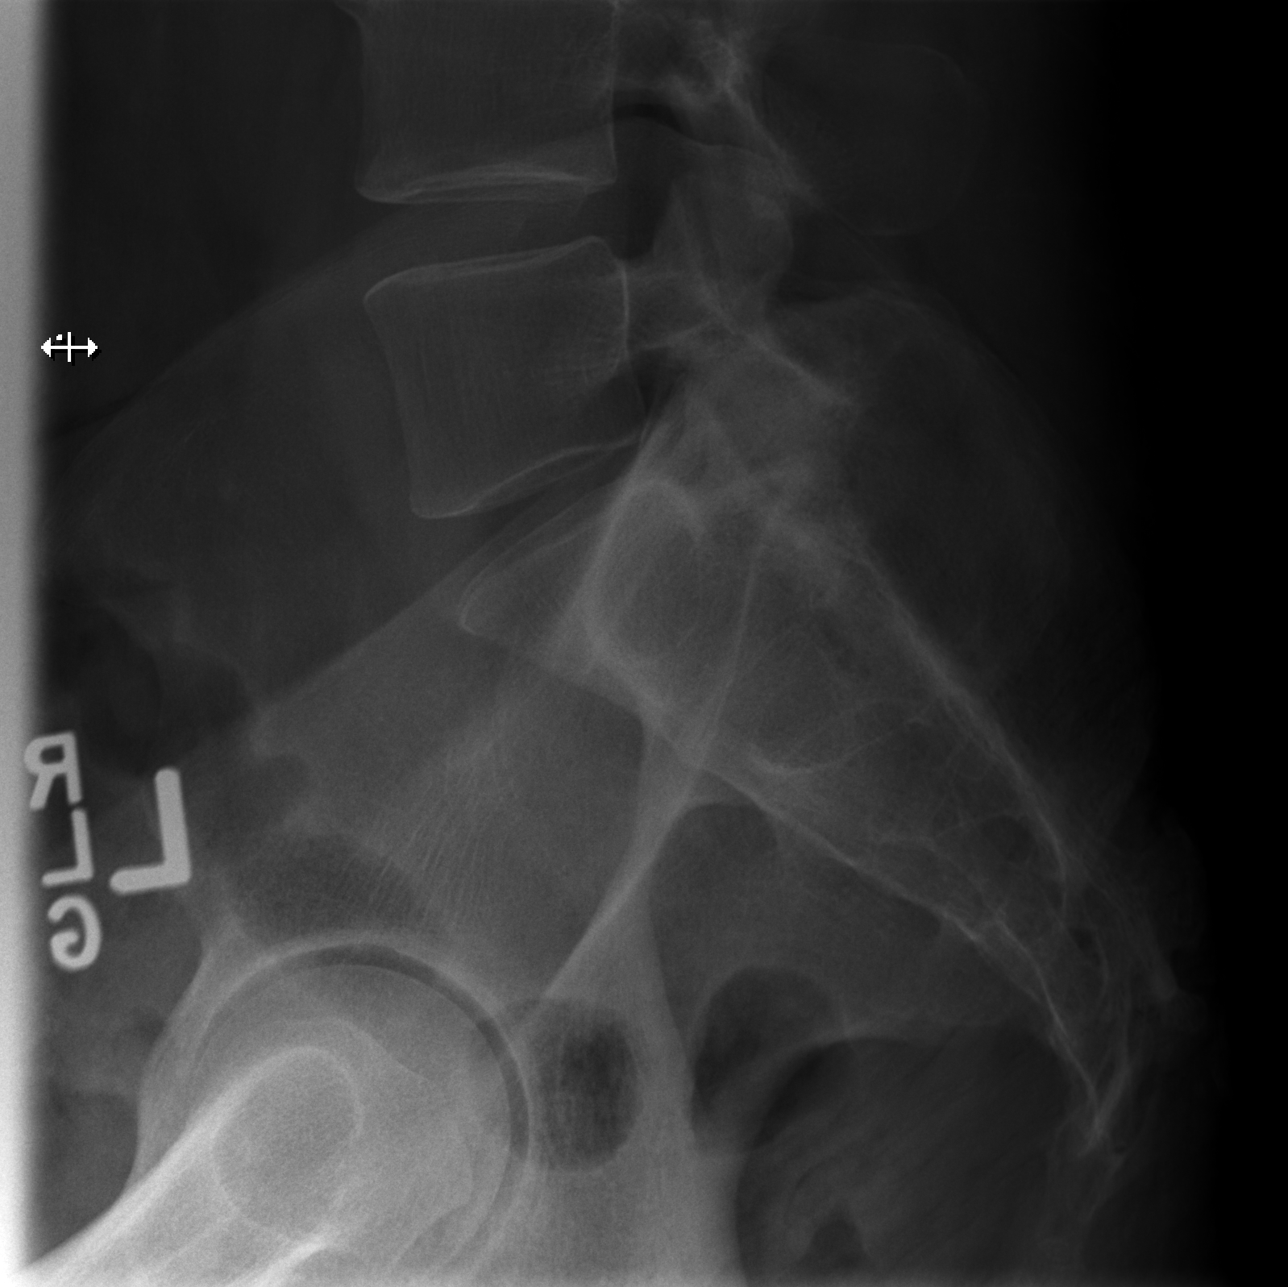

[5 of 5 positions shown; findings below may reference images not displayed]

FINDINGS: There is no evidence of lumbar spine fracture. Alignment is normal.
Intervertebral disc spaces are maintained.
IMPRESSION: Negative.

## 2017-04-27 MED ORDER — ACETAMINOPHEN 500 MG PO TABS
1000.0000 mg | ORAL_TABLET | Freq: Once | ORAL | Status: AC
  Administered 2017-04-27: 1000 mg via ORAL
  Filled 2017-04-27: qty 2

## 2017-04-27 MED ORDER — IBUPROFEN 200 MG PO TABS
400.0000 mg | ORAL_TABLET | Freq: Once | ORAL | Status: AC
  Administered 2017-04-27: 400 mg via ORAL
  Filled 2017-04-27: qty 2

## 2017-04-27 MED ORDER — METHOCARBAMOL 500 MG PO TABS: 1000.0000 mg | ORAL_TABLET | Freq: Three times a day (TID) | ORAL | 0 refills | Status: DC | PRN

## 2017-04-27 MED ORDER — ONDANSETRON 4 MG PO TBDP
4.0000 mg | ORAL_TABLET | Freq: Once | ORAL | Status: AC | PRN
  Administered 2017-04-27: 4 mg via ORAL
  Filled 2017-04-27: qty 1

## 2017-04-27 NOTE — ED Notes (Signed)
Patient is alert and oriented x3.  He was given DC instructions and follow up visit instructions.  Patient gave verbal understanding.  He was DC ambulatory under his own power to home.  V/S stable.  He was not showing any signs of distress on DC 
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
## 2017-04-27 NOTE — Discharge Instructions (Signed)
It was our pleasure to provide your ER care today - we hope that you feel better.  Rest. Avoid bending at waist or heavy lifting > 10 lbs for the next few days.  Take motrin or aleve as need for pain.  You may also take acetaminophen for pain.  Take robaxin as need for muscle pain/spasm  - no driving when taking.  Follow up with primary care doctor in 1 week if symptoms fail to improve/resolve.  Return to ER if worse, new symptoms, fevers, intractable pain, other concern.

## 2017-05-25 ENCOUNTER — Emergency Department (HOSPITAL_BASED_OUTPATIENT_CLINIC_OR_DEPARTMENT_OTHER): Payer: BLUE CROSS/BLUE SHIELD

## 2017-05-25 ENCOUNTER — Encounter (HOSPITAL_BASED_OUTPATIENT_CLINIC_OR_DEPARTMENT_OTHER): Payer: Self-pay | Admitting: *Deleted

## 2017-05-25 ENCOUNTER — Emergency Department (HOSPITAL_BASED_OUTPATIENT_CLINIC_OR_DEPARTMENT_OTHER)
Admission: EM | Admit: 2017-05-25 | Discharge: 2017-05-25 | Disposition: A | Discharge status: Home / Self Care | Payer: BLUE CROSS/BLUE SHIELD | Attending: Emergency Medicine | Admitting: Emergency Medicine

## 2017-05-25 DIAGNOSIS — R112 Nausea with vomiting, unspecified: Secondary | ICD-10-CM | POA: Insufficient documentation

## 2017-05-25 DIAGNOSIS — F17210 Nicotine dependence, cigarettes, uncomplicated: Secondary | ICD-10-CM | POA: Insufficient documentation

## 2017-05-25 DIAGNOSIS — J45909 Unspecified asthma, uncomplicated: Secondary | ICD-10-CM | POA: Insufficient documentation

## 2017-05-25 DIAGNOSIS — R55 Syncope and collapse: Secondary | ICD-10-CM | POA: Insufficient documentation

## 2017-05-25 DIAGNOSIS — R197 Diarrhea, unspecified: Secondary | ICD-10-CM | POA: Insufficient documentation

## 2017-05-25 LAB — BASIC METABOLIC PANEL
Anion gap: 9 (ref 5–15)
BUN: 12 mg/dL (ref 6–20)
CHLORIDE: 104 mmol/L (ref 101–111)
CO2: 24 mmol/L (ref 22–32)
Calcium: 9.3 mg/dL (ref 8.9–10.3)
Creatinine, Ser: 0.9 mg/dL (ref 0.61–1.24)
GFR calc non Af Amer: >60 mL/min (ref >60)
Glucose, Bld: 106 mg/dL — ABNORMAL HIGH (ref 65–99)
POTASSIUM: 3.3 mmol/L — AB (ref 3.5–5.1)
SODIUM: 137 mmol/L (ref 135–145)

## 2017-05-25 LAB — CBC WITH DIFFERENTIAL/PLATELET
BASOS ABS: 0 10*3/uL (ref 0.0–0.1)
Basophils Relative: 0 %
Eosinophils Absolute: 0.2 10*3/uL (ref 0.0–0.7)
Eosinophils Relative: 2 %
HEMATOCRIT: 43.8 % (ref 39.0–52.0)
HEMOGLOBIN: 15.8 g/dL (ref 13.0–17.0)
LYMPHS ABS: 1.4 10*3/uL (ref 0.7–4.0)
LYMPHS PCT: 20 %
MCH: 29.6 pg (ref 26.0–34.0)
MCHC: 36.1 g/dL — ABNORMAL HIGH (ref 30.0–36.0)
MCV: 82 fL (ref 78.0–100.0)
MONO ABS: 0.5 10*3/uL (ref 0.1–1.0)
Monocytes Relative: 6 %
NEUTROS ABS: 5.2 10*3/uL (ref 1.7–7.7)
Neutrophils Relative %: 71 %
Platelets: 109 10*3/uL — ABNORMAL LOW (ref 150–400)
RBC: 5.34 MIL/uL (ref 4.22–5.81)
RDW: 14.3 % (ref 11.5–15.5)
WBC: 7.2 10*3/uL (ref 4.0–10.5)

## 2017-05-25 LAB — TROPONIN I

## 2017-05-25 IMAGING — CR DG CHEST 2V
2 series · 2 of 2 positions shown · non-contrast
Comparison: Chest radiograph performed [DATE]

CLINICAL DATA: Acute onset of syncope. Headache and dizziness.
Nausea. Initial encounter.

EXAM:
CHEST  2 VIEW

[w chest pa]
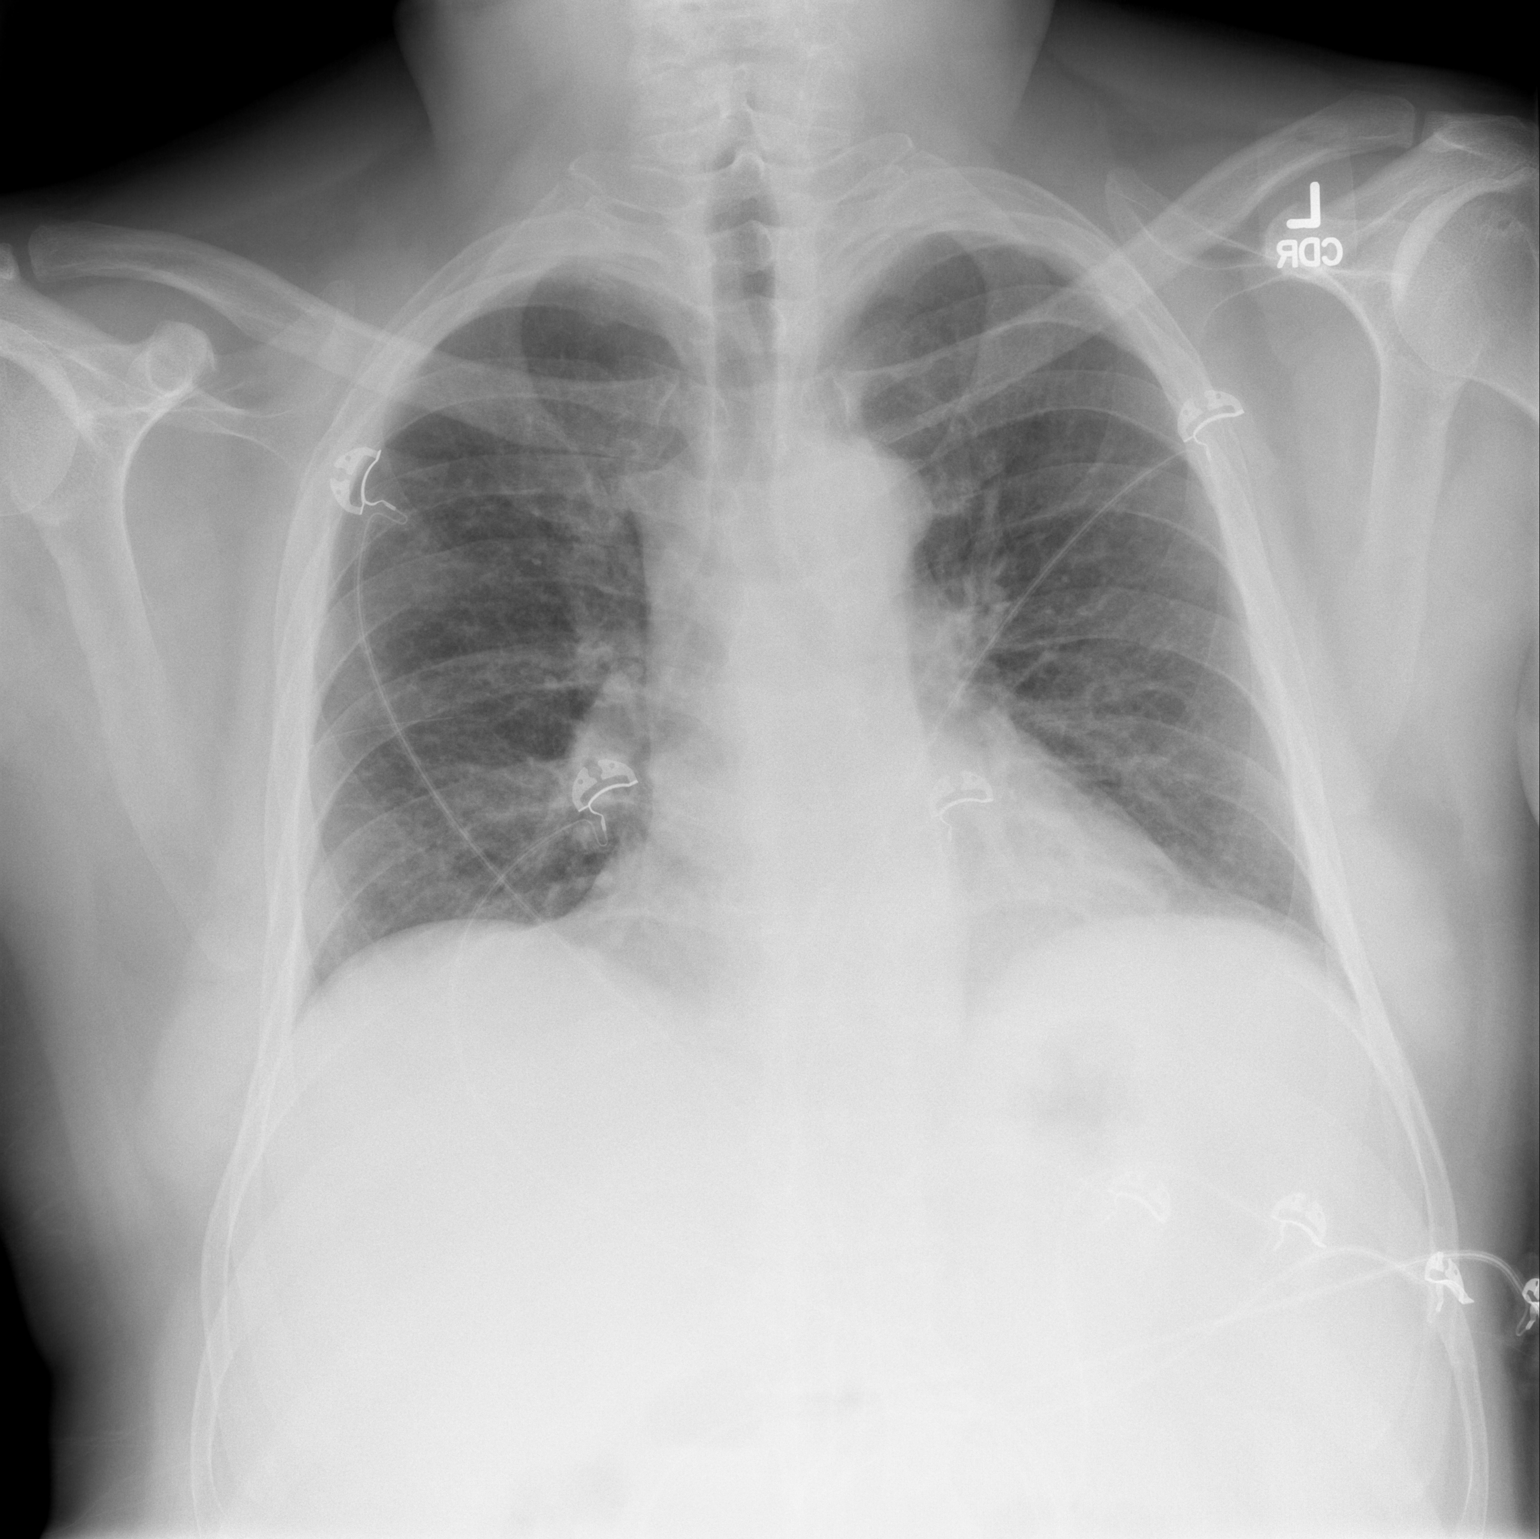

[w chest lat]
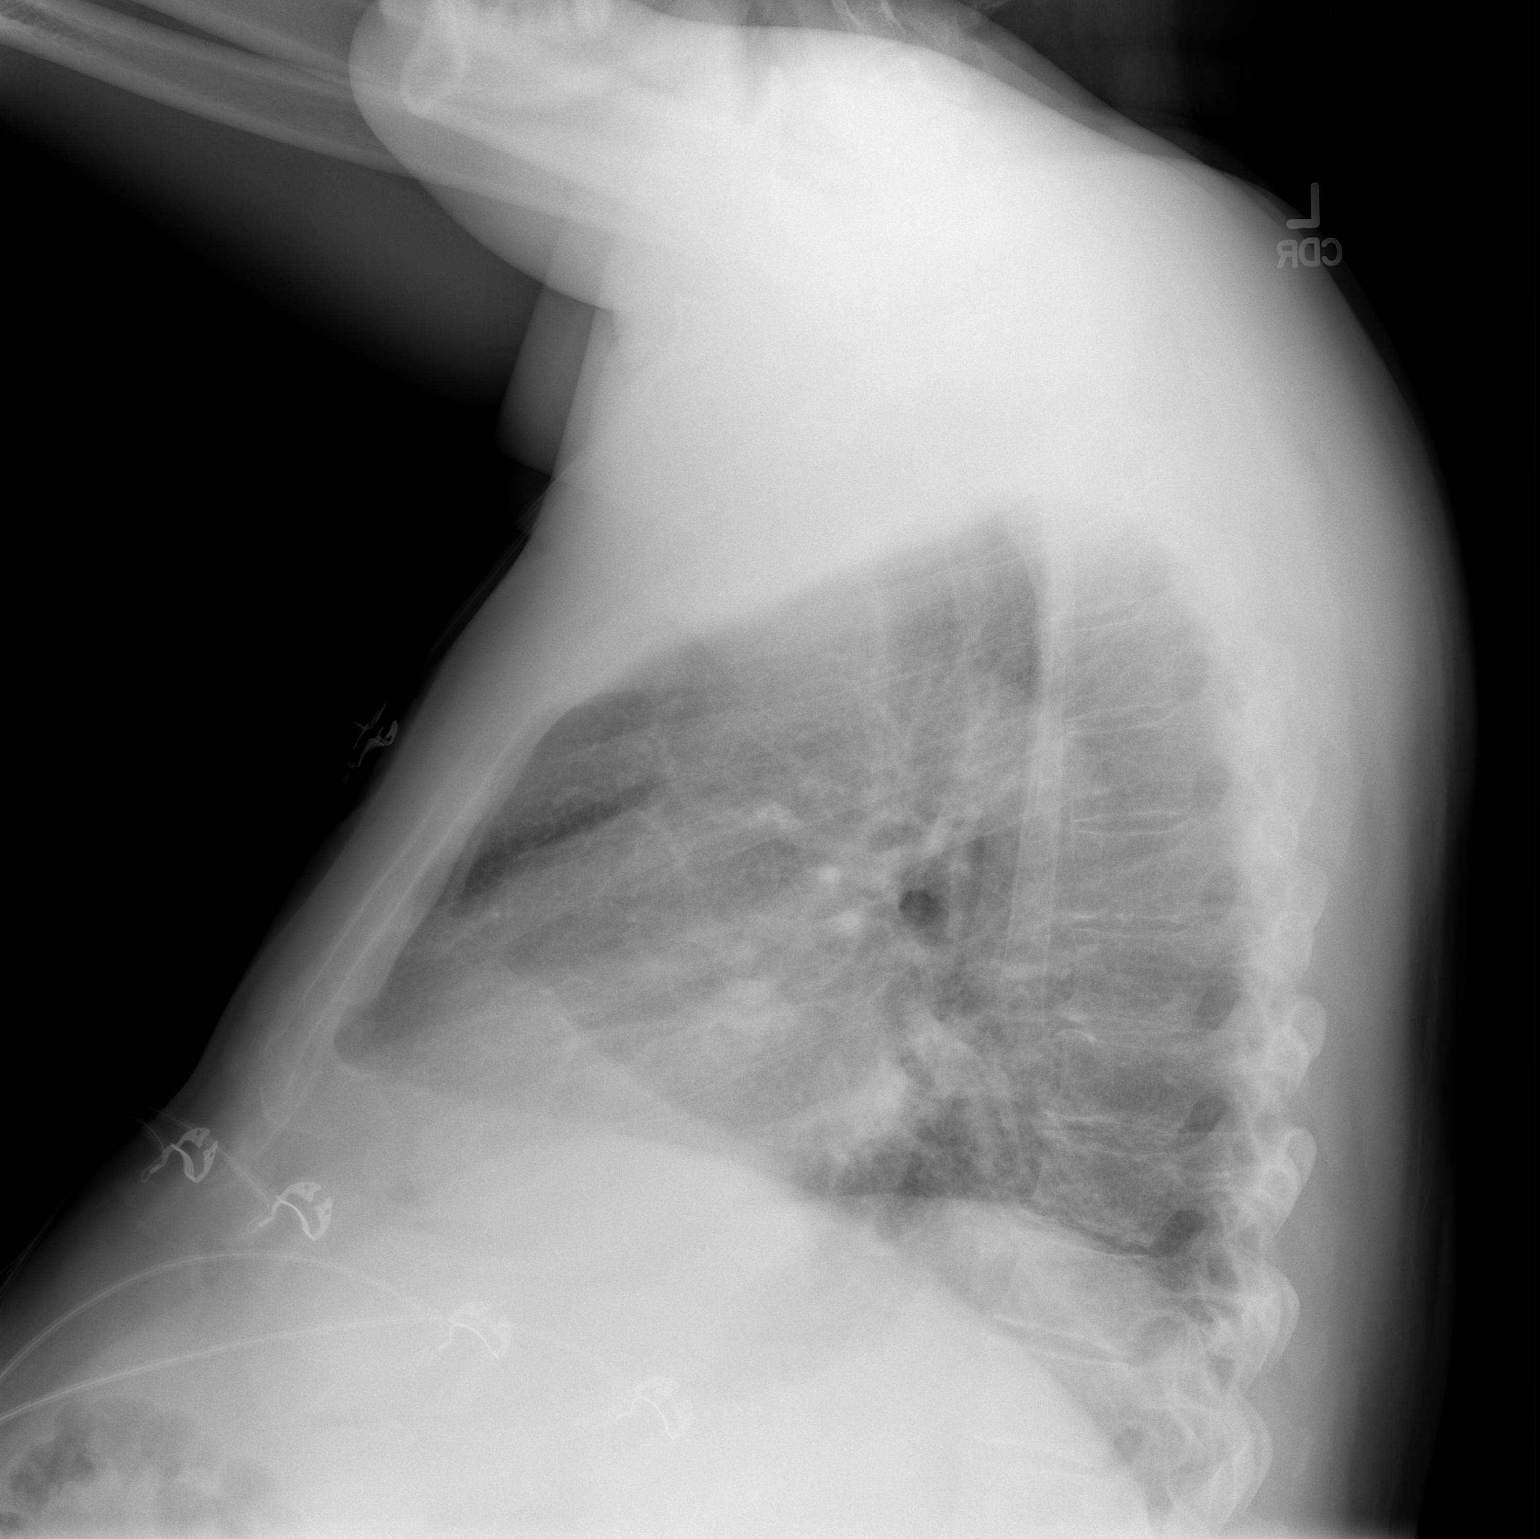

[2 of 2 positions shown; findings below may reference images not displayed]

FINDINGS: The lungs are well-aerated. Mild vascular congestion is noted.
Minimal bibasilar opacities likely reflect atelectasis. There is no
evidence of pleural effusion or pneumothorax.

The heart is mildly enlarged. No acute osseous abnormalities are
seen.
IMPRESSION: Mild vascular congestion and mild cardiomegaly. Minimal bibasilar
opacities likely reflect atelectasis.

## 2017-05-25 IMAGING — CT CT ANGIO NECK
1 of 8 series · 6 of 33 positions shown · IV contrast (APPLIED)
Comparison: None.

CLINICAL DATA: 46 y/o  M; syncope.

EXAM:
CT ANGIOGRAPHY HEAD AND NECK
TECHNIQUE: Multidetector CT imaging of the head and neck was performed using
the standard protocol during bolus administration of intravenous
contrast. Multiplanar CT image reconstructions and MIPs were
obtained to evaluate the vascular anatomy. Carotid stenosis
measurements (when applicable) are obtained utilizing NASCET
criteria, using the distal internal carotid diameter as the
denominator.
CONTRAST:  50 cc Isovue 370

[Series 7: ax thin · axial · 0.39mm/px · z∈[+1149,+1400]mm · 6 of 353 slices shown]
[im 51/353  soft-tissue]
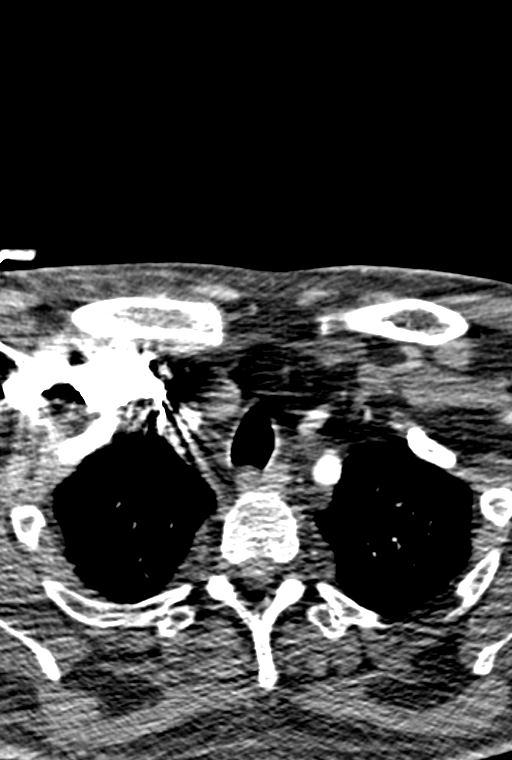
[im 101/353  bone]
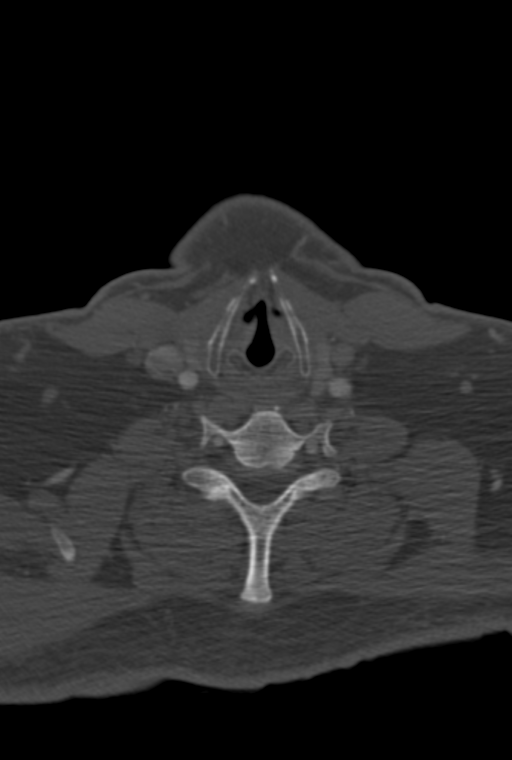
[im 151/353  soft-tissue]
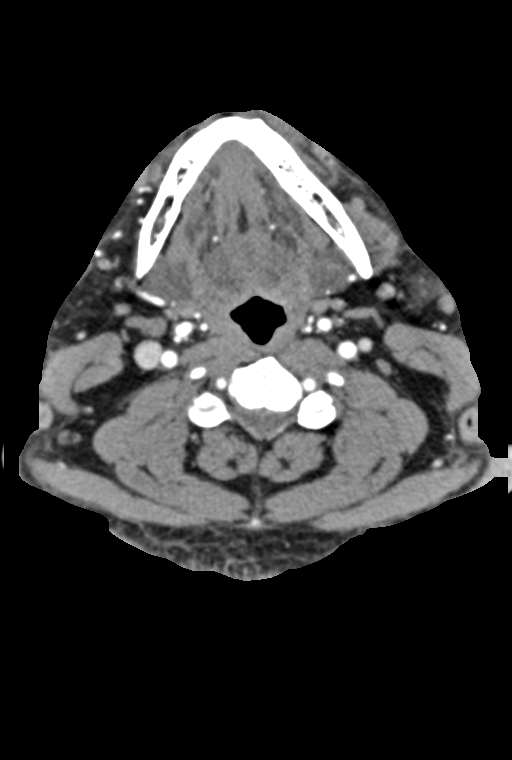
[im 202/353  bone]
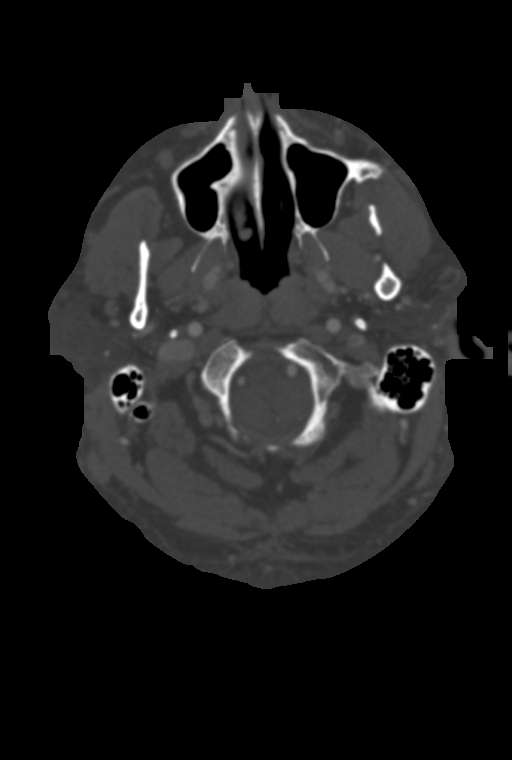
[im 252/353  soft-tissue]
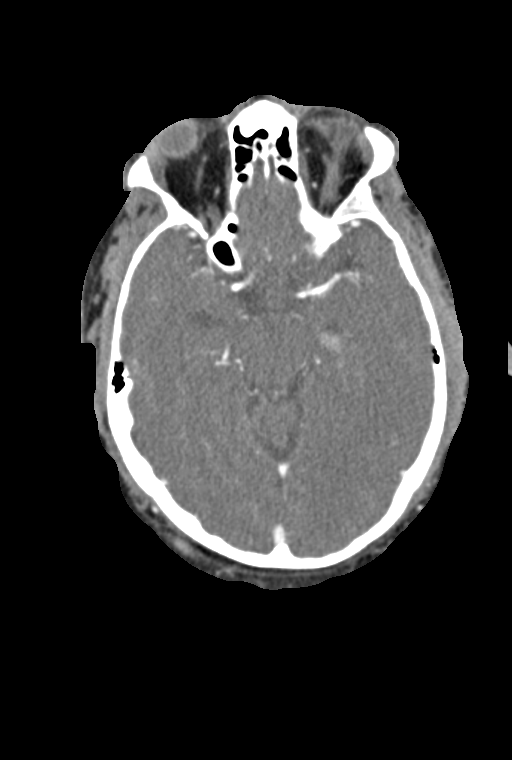
[im 302/353  bone]
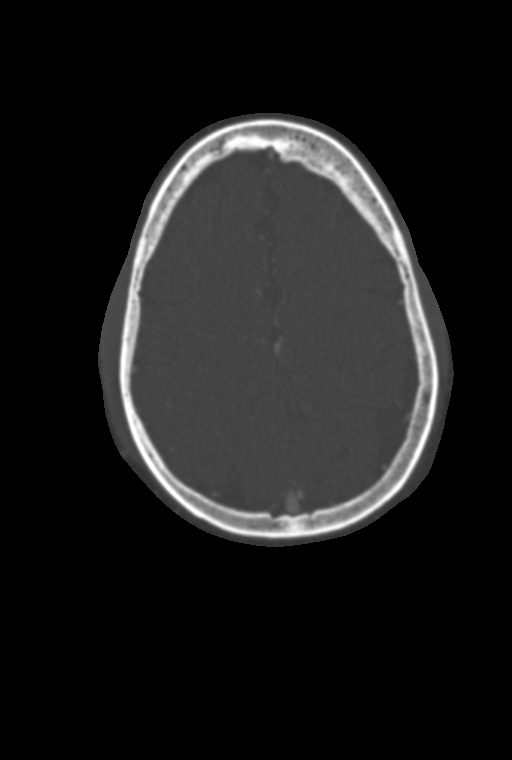

[6 of 33 positions shown; findings below may reference images not displayed]

FINDINGS: CTA NECK FINDINGS

Aortic arch: Standard branching. Imaged portion shows no evidence of
aneurysm or dissection. No significant stenosis of the major arch
vessel origins.

Right carotid system: No evidence of dissection, stenosis (50% or
greater) or occlusion.

Left carotid system: No evidence of dissection, stenosis (50% or
greater) or occlusion.

Vertebral arteries: Left dominant. No evidence of dissection,
stenosis (50% or greater) or occlusion.

Skeleton: Negative.

Other neck: Negative.

Upper chest: Negative.

Review of the MIP images confirms the above findings

CTA HEAD FINDINGS

Anterior circulation: No significant stenosis, proximal occlusion,
aneurysm, or vascular malformation.

Posterior circulation: No significant stenosis, proximal occlusion,
aneurysm, or vascular malformation.

Venous sinuses: As permitted by contrast timing, patent.

Anatomic variants: No posterior communicating artery identified,
likely hypoplastic or absent. Small anterior communicating artery.

Delayed phase: No abnormal intracranial enhancement.

Review of the MIP images confirms the above findings
IMPRESSION: 1. Patent carotid and vertebral arteries. No dissection, aneurysm,
or significant stenosis is identified.
2. Patent circle of Willis. No large vessel occlusion, aneurysm, or
significant stenosis is identified.

By: BILLIOT M.D.

## 2017-05-25 IMAGING — CT CT HEAD W/O CM
3 series · 16 of 47 positions shown, 19 images · non-contrast
Comparison: [DATE] CT head

CLINICAL DATA: 46 y/o  M; loss of consciousness and vomiting.

EXAM:
CT HEAD WITHOUT CONTRAST
TECHNIQUE: Contiguous axial images were obtained from the base of the skull
through the vertex without intravenous contrast.

[Series 2: head wo · axial · 0.49mm/px · z∈[+1294,+1439]mm · 10 of 35 slices shown, 13 images]
[im 3/35  brain]
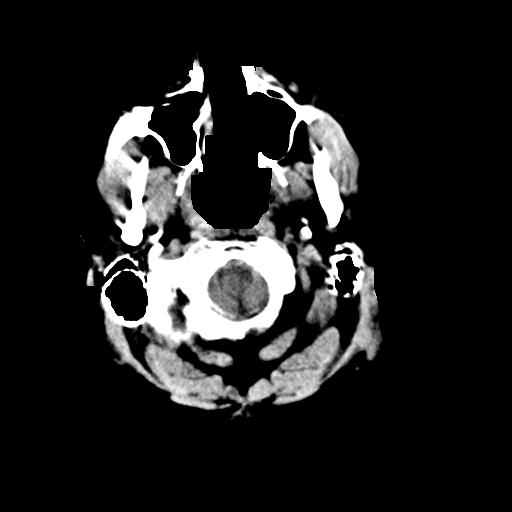
[im 3/35  bone]
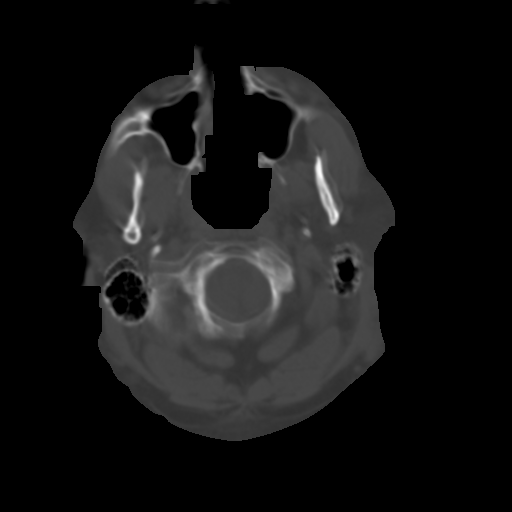
[im 6/35  brain]
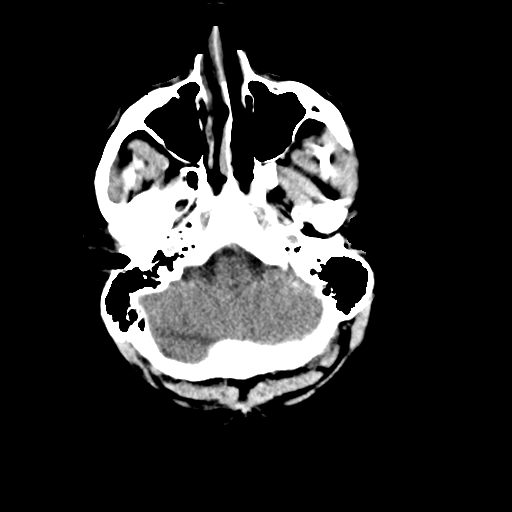
[im 10/35  brain]
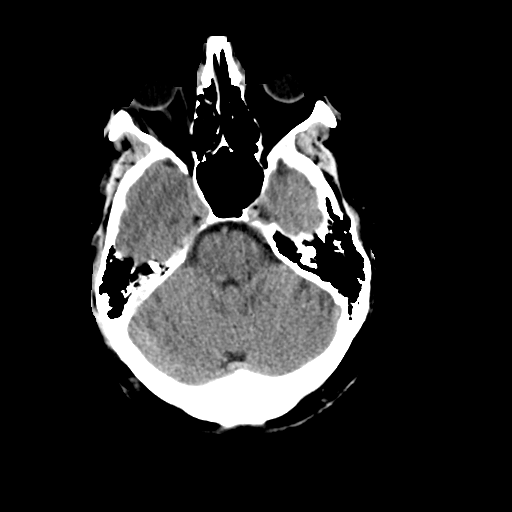
[im 12/35  brain]
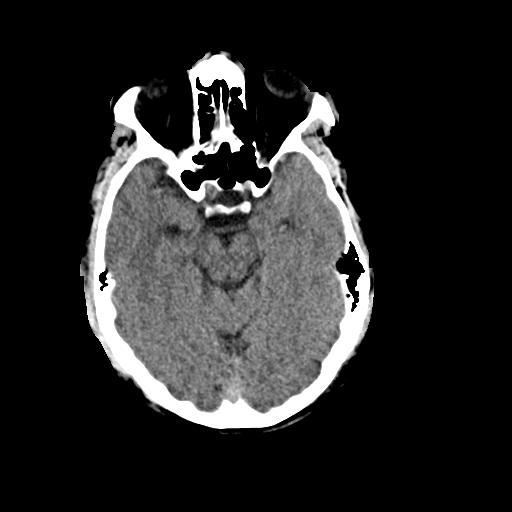
[im 16/35  brain]
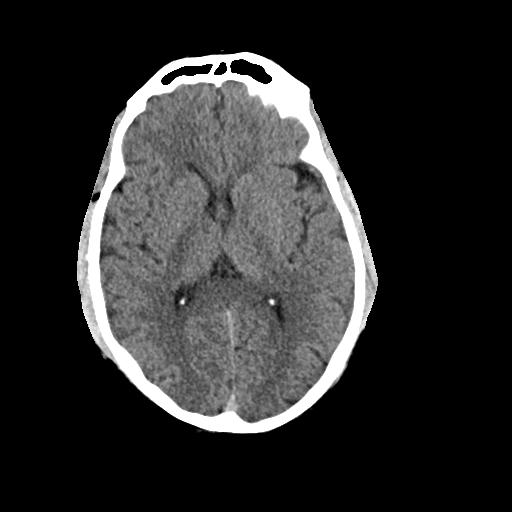
[im 16/35  bone]
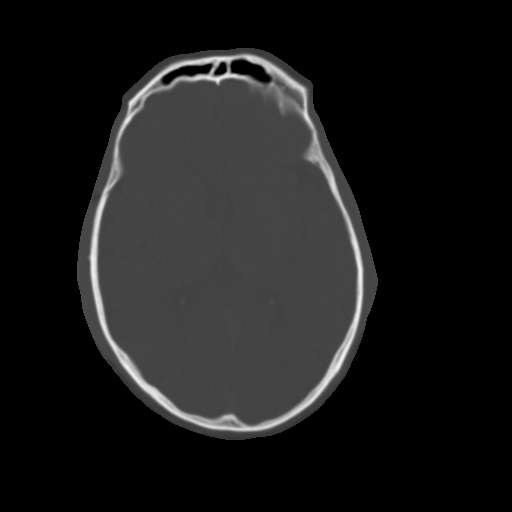
[im 19/35  brain]
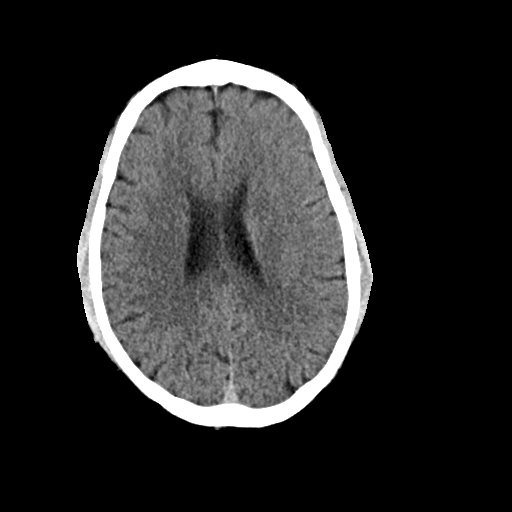
[im 23/35  brain]
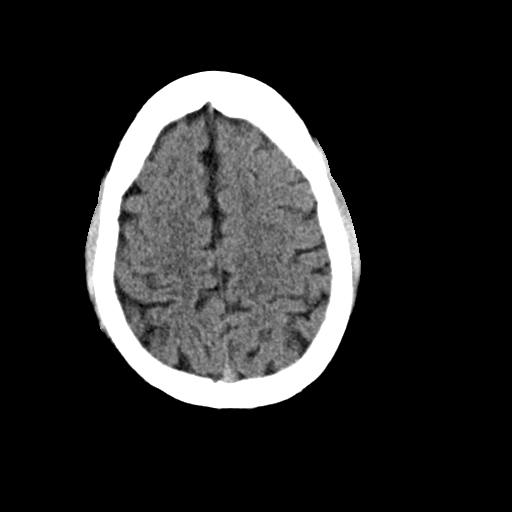
[im 26/35  brain]
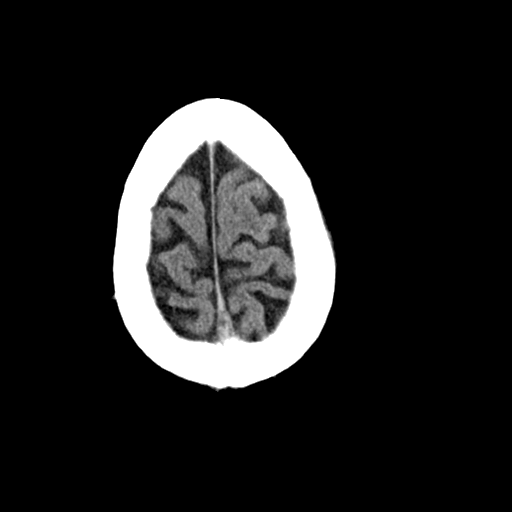
[im 29/35  brain]
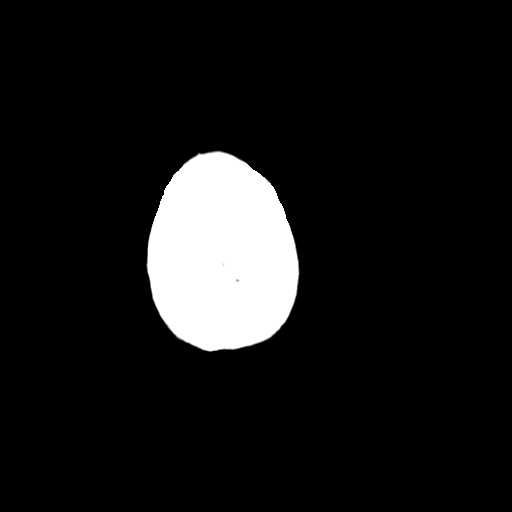
[im 29/35  bone]
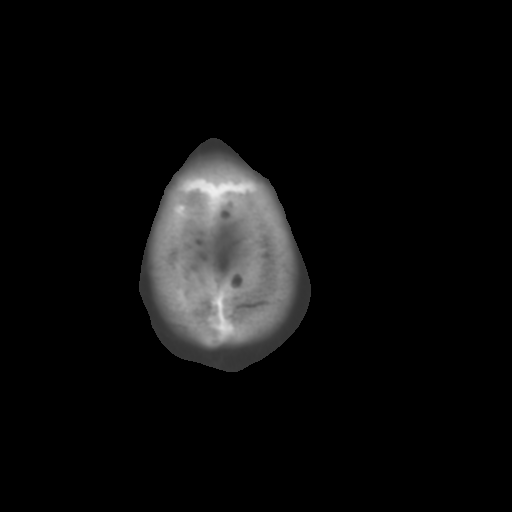
[im 32/35  brain]
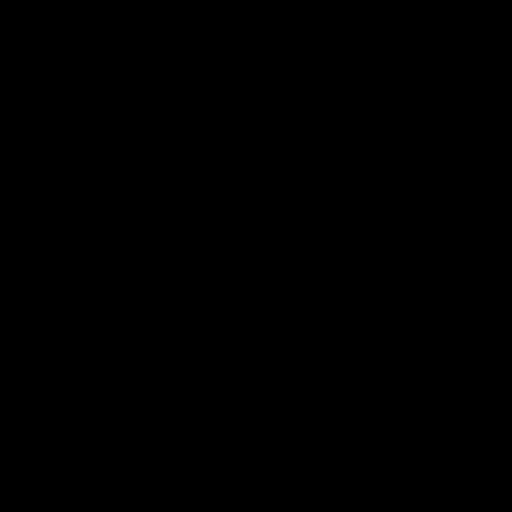

[Series 4: coronal soft · coronal · 0.35mm/px · 3 of 75 slices shown]
[im 25/75  brain]
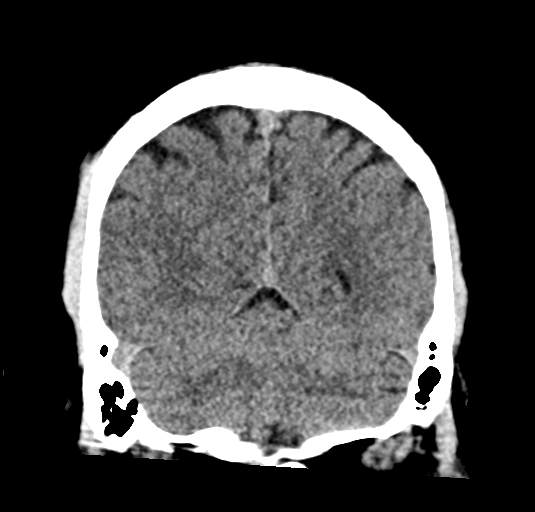
[im 33/75  brain]
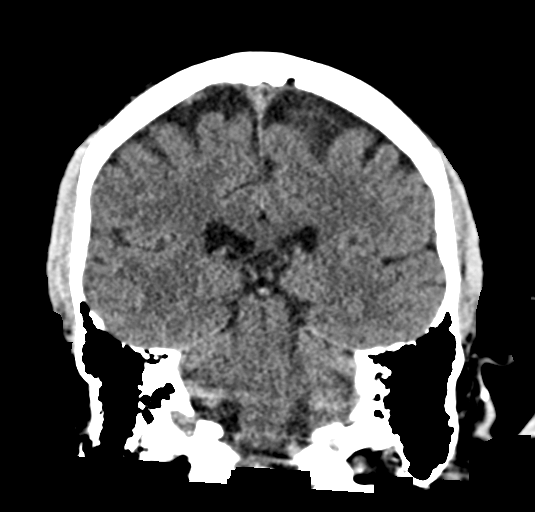
[im 42/75  brain]
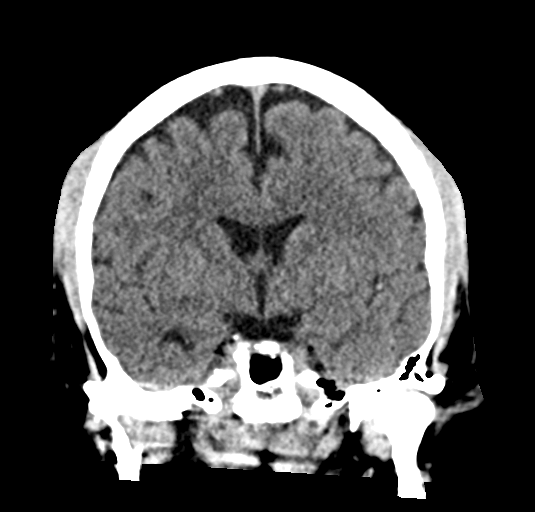

[Series 5: sag soft · sagittal · 0.34mm/px · 3 of 67 slices shown]
[im 23/67  brain]
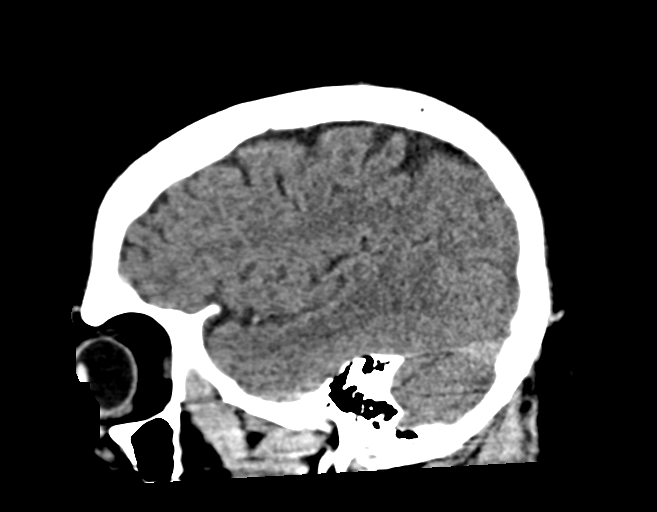
[im 34/67  brain]
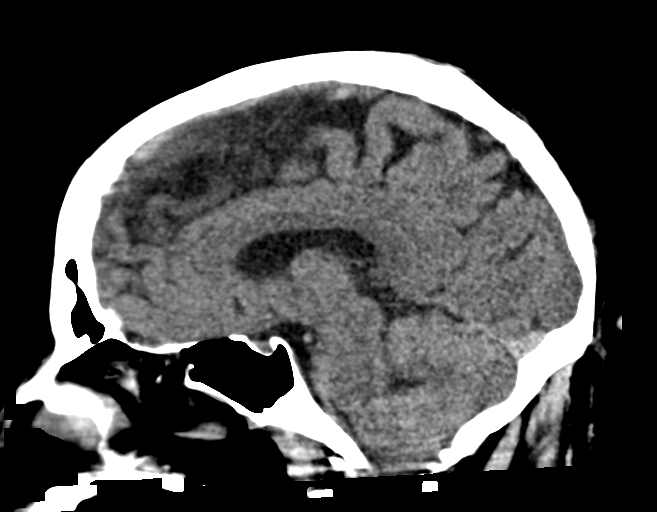
[im 45/67  brain]
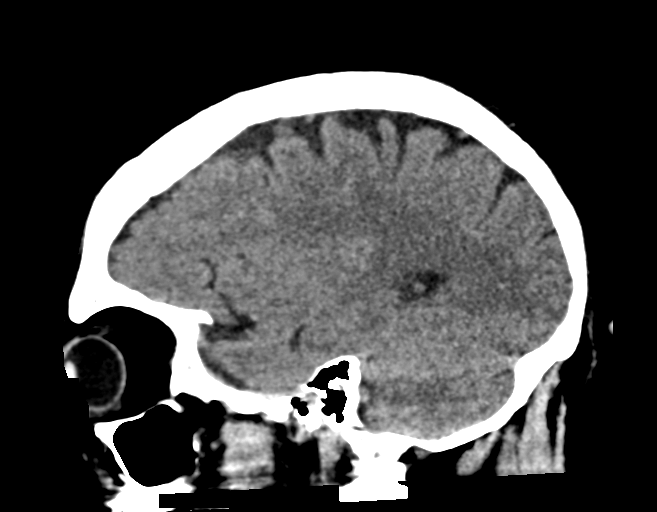

[16 of 47 positions shown; findings below may reference images not displayed]

FINDINGS: Brain: No evidence of acute infarction, hemorrhage, hydrocephalus,
extra-axial collection or mass lesion/mass effect.

Vascular: No hyperdense vessel or unexpected calcification.

Skull: Normal. Negative for fracture or focal lesion.

Sinuses/Orbits: No acute finding.

Other: None.
IMPRESSION: No acute intracranial abnormality identified. Unremarkable CT of the
head.

By: HENNITA M.D.

## 2017-05-25 MED ORDER — METOCLOPRAMIDE HCL 5 MG/ML IJ SOLN
10.0000 mg | Freq: Once | INTRAMUSCULAR | Status: AC
  Administered 2017-05-25: 10 mg via INTRAVENOUS
  Filled 2017-05-25: qty 2

## 2017-05-25 MED ORDER — ONDANSETRON 4 MG PO TBDP: ORAL_TABLET | ORAL | 0 refills | Status: DC

## 2017-05-25 MED ORDER — DIPHENHYDRAMINE HCL 50 MG/ML IJ SOLN
25.0000 mg | Freq: Once | INTRAMUSCULAR | Status: AC
  Administered 2017-05-25: 25 mg via INTRAVENOUS
  Filled 2017-05-25: qty 1

## 2017-05-25 MED ORDER — SODIUM CHLORIDE 0.9 % IV BOLUS (SEPSIS)
1000.0000 mL | Freq: Once | INTRAVENOUS | Status: AC
  Administered 2017-05-25: 1000 mL via INTRAVENOUS

## 2017-05-25 MED ORDER — IOPAMIDOL (ISOVUE-370) INJECTION 76%
100.0000 mL | Freq: Once | INTRAVENOUS | Status: AC | PRN
  Administered 2017-05-25: 100 mL via INTRAVENOUS

## 2017-05-25 MED ORDER — MAGNESIUM SULFATE 2 GM/50ML IV SOLN
2.0000 g | Freq: Once | INTRAVENOUS | Status: AC
  Administered 2017-05-25: 2 g via INTRAVENOUS
  Filled 2017-05-25: qty 50

## 2017-05-25 MED ORDER — IBUPROFEN 800 MG PO TABS: 800.0000 mg | ORAL_TABLET | Freq: Three times a day (TID) | ORAL | 0 refills | Status: DC

## 2017-05-25 NOTE — ED Provider Notes (Signed)
[facility] Provider Note   CSN: [id number] Arrival date & time: 05/25/17  0106     History   Chief Complaint Chief Complaint  Patient presents with  . Loss of Consciousness    HPI Alan Sanders is a 46 y.o. male.  The history is provided by the patient.  Loss of Consciousness   This is a new problem. The current episode started 6 to 12 hours ago. The problem occurs rarely. The problem has been resolved. He lost consciousness for a period of less than one minute. The problem is associated with an unknown factor. Associated symptoms include nausea and vomiting. Pertinent negatives include abdominal pain, back pain, bladder incontinence, bowel incontinence, chest pain, clumsiness, confusion, congestion, diaphoresis, dizziness, fever, focal sensory loss, focal weakness, light-headedness, malaise/fatigue, palpitations, seizures, slurred speech, vertigo, visual change and weakness. Associated symptoms comments: Had nausea vomiting and diarrhea x 2 days. He has tried nothing for the symptoms. The treatment provided significant relief. His past medical history is significant for seizures. His past medical history does not include CVA or TIA.  2 days of diarrhea and then nausea and vomiting and HA started while outside smoking tonight.    Past Medical History:  Diagnosis Date  . Arthritis    " in my back "  . Asthma   . GERD (gastroesophageal reflux disease)   . Seizures ([place])    " its been along time ,since my last seizure "  . Spleen enlarged   . Thrombocytopenia ([place])   . Tobacco abuse     Patient Active Problem List   Diagnosis Date Noted  . Cough 02/13/2015  . Thrombocytopenia ([place]) 02/13/2015  . Chest pain 02/12/2015  . Asthma 02/12/2015  . Tobacco abuse 02/12/2015  . Nausea vomiting and diarrhea 02/12/2015  . Pain in the chest     Past Surgical History:  Procedure Laterality Date  . HERNIA REPAIR         Home Medications    Prior to Admission  medications   Medication Sig Start Date End Date Taking? Authorizing Provider  Homeopathic Products (BHI BACK PAIN RELIEF) TABS Take 2 tablets by mouth every 8 (eight) hours as needed (for pain).    [provider]  ibuprofen (ADVIL,MOTRIN) 600 MG tablet Take 1 tablet (600 mg total) by mouth every 6 (six) hours as needed. Patient not taking: Reported on 04/27/2017 02/04/17   Julianne Rice, MD  methocarbamol (ROBAXIN) 500 MG tablet Take 2 tablets (1,000 mg total) by mouth every 8 (eight) hours as needed for muscle spasms. Patient not taking: Reported on 04/27/2017 02/04/17   Julianne Rice, MD  methocarbamol (ROBAXIN) 500 MG tablet Take 2 tablets (1,000 mg total) by mouth 3 (three) times daily as needed for muscle spasms. 04/27/17   Lajean Saver, MD    Family History Family History  Problem Relation Age of Onset  . Diabetes Mother   . Hypertension Mother   . COPD Mother     Social History Social History  Substance Use Topics  . Smoking status: Current Every Day Smoker    Packs/day: 0.50    Years: 24.00    Types: Cigarettes  . Smokeless tobacco: Never Used  . Alcohol use No     Allergies   Patient has no known allergies.   Review of Systems Review of Systems  Constitutional: Negative for diaphoresis, fever and malaise/fatigue.  HENT: Negative for congestion.   Cardiovascular: Positive for syncope. Negative for chest pain and palpitations.  Gastrointestinal: Positive for nausea and vomiting. Negative for abdominal pain, bowel incontinence and constipation.  Genitourinary: Negative for bladder incontinence, decreased urine volume, flank pain and frequency.  Musculoskeletal: Negative for back pain, neck pain and neck stiffness.  Skin: Negative for color change, pallor, rash and wound.  Neurological: Positive for syncope. Negative for dizziness, vertigo, tremors, focal weakness, seizures, facial asymmetry, speech difficulty, weakness, light-headedness and numbness.    Psychiatric/Behavioral: Negative for confusion.  All other systems reviewed and are negative.    Physical Exam Updated Vital Signs BP 131/88   Pulse 67   Temp 98 F (36.7 C) (Oral)   Resp 14   Ht 6\' 1"  (1.854 m)   Wt 106.6 kg (235 lb)   SpO2 98%   BMI 31.00 kg/m   Physical Exam  Constitutional: He is oriented to person, place, and time. He appears well-developed and well-nourished. No distress.  Sitting in the room comfortably with all the lights on  HENT:  Head: Normocephalic and atraumatic.  Mouth/Throat: Oropharynx is clear and moist. No oropharyngeal exudate.  Eyes: Conjunctivae and EOM are normal. Pupils are equal, round, and reactive to light.  Neck: Normal range of motion. Neck supple. No JVD present. No spinous process tenderness and no muscular tenderness present. No neck rigidity. Normal range of motion present. No Brudzinski's sign and no Kernig's sign noted.  Cardiovascular: Normal rate, regular rhythm, normal heart sounds and intact distal pulses.   Pulmonary/Chest: Effort normal and breath sounds normal. No stridor.  Abdominal: Soft. Bowel sounds are normal. He exhibits no mass. There is no tenderness. There is no rebound and no guarding.  Musculoskeletal: Normal range of motion.  Lymphadenopathy:    He has no cervical adenopathy.  Neurological: He is alert and oriented to person, place, and time.  Skin: Skin is warm and dry. Capillary refill takes less than 2 seconds.  Psychiatric: He has a normal mood and affect.  Nursing note and vitals reviewed.    ED Treatments / Results   Vitals:   05/25/17 0116 05/25/17 0130  BP: (!) 140/96 131/88  Pulse:  67  Resp: 12 14  Temp: 98 F (36.7 C)     Labs (all labs ordered are listed, but only abnormal results are displayed) Results for orders placed or performed during the hospital encounter of 05/25/17  CBC with Differential/Platelet  Result Value Ref Range   WBC 7.2 4.0 - 10.5 K/uL   RBC 5.34 4.22 - 5.81  MIL/uL   Hemoglobin 15.8 13.0 - 17.0 g/dL   HCT 43.8 39.0 - 52.0 %   MCV 82.0 78.0 - 100.0 fL   MCH 29.6 26.0 - 34.0 pg   MCHC 36.1 (H) 30.0 - 36.0 g/dL   RDW 14.3 11.5 - 15.5 %   Platelets 109 (L) 150 - 400 K/uL   Neutrophils Relative % 71 %   Neutro Abs 5.2 1.7 - 7.7 K/uL   Lymphocytes Relative 20 %   Lymphs Abs 1.4 0.7 - 4.0 K/uL   Monocytes Relative 6 %   Monocytes Absolute 0.5 0.1 - 1.0 K/uL   Eosinophils Relative 2 %   Eosinophils Absolute 0.2 0.0 - 0.7 K/uL   Basophils Relative 0 %   Basophils Absolute 0.0 0.0 - 0.1 K/uL   WBC Morphology WHITE COUNT CONFIRMED ON SMEAR    Smear Review PLATELET COUNT CONFIRMED BY SMEAR   Basic metabolic panel  Result Value Ref Range   Sodium 137 135 - 145 mmol/L   Potassium 3.3 (  L) 3.5 - 5.1 mmol/L   Chloride 104 101 - 111 mmol/L   CO2 24 22 - 32 mmol/L   Glucose, Bld 106 (H) 65 - 99 mg/dL   BUN 12 6 - 20 mg/dL   Creatinine, Ser 0.90 0.61 - 1.24 mg/dL   Calcium 9.3 8.9 - 10.3 mg/dL   GFR calc non Af Amer >60 >60 mL/min   GFR calc Af Amer >60 >60 mL/min   Anion gap 9 5 - 15  Troponin I  Result Value Ref Range   Troponin I <0.03 <0.03 ng/mL   Ct Angio Head W Or Wo Contrast  Result Date: 05/25/2017 CLINICAL DATA:  46 y/o  M; syncope. EXAM: CT ANGIOGRAPHY HEAD AND NECK TECHNIQUE: Multidetector CT imaging of the head and neck was performed using the standard protocol during bolus administration of intravenous contrast. Multiplanar CT image reconstructions and MIPs were obtained to evaluate the vascular anatomy. Carotid stenosis measurements (when applicable) are obtained utilizing NASCET criteria, using the distal internal carotid diameter as the denominator. CONTRAST:  50 cc Isovue 370 COMPARISON:  None. FINDINGS: CTA NECK FINDINGS Aortic arch: Standard branching. Imaged portion shows no evidence of aneurysm or dissection. No significant stenosis of the major arch vessel origins. Right carotid system: No evidence of dissection, stenosis (50% or  greater) or occlusion. Left carotid system: No evidence of dissection, stenosis (50% or greater) or occlusion. Vertebral arteries: Left dominant. No evidence of dissection, stenosis (50% or greater) or occlusion. Skeleton: Negative. Other neck: Negative. Upper chest: Negative. Review of the MIP images confirms the above findings CTA HEAD FINDINGS Anterior circulation: No significant stenosis, proximal occlusion, aneurysm, or vascular malformation. Posterior circulation: No significant stenosis, proximal occlusion, aneurysm, or vascular malformation. Venous sinuses: As permitted by contrast timing, patent. Anatomic variants: No posterior communicating artery identified, likely hypoplastic or absent. Small anterior communicating artery. Delayed phase: No abnormal intracranial enhancement. Review of the MIP images confirms the above findings IMPRESSION: 1. Patent carotid and vertebral arteries. No dissection, aneurysm, or significant stenosis is identified. 2. Patent circle of Willis. No large vessel occlusion, aneurysm, or significant stenosis is identified. Electronically Signed   By: Kristine Garbe M.D.   On: 05/25/2017 03:08   Dg Chest 2 View  Result Date: 05/25/2017 CLINICAL DATA:  Acute onset of syncope. Headache and dizziness. Nausea. Initial encounter. EXAM: CHEST  2 VIEW COMPARISON:  Chest radiograph performed 02/12/2015 FINDINGS: The lungs are well-aerated. Mild vascular congestion is noted. Minimal bibasilar opacities likely reflect atelectasis. There is no evidence of pleural effusion or pneumothorax. The heart is mildly enlarged. No acute osseous abnormalities are seen. IMPRESSION: Mild vascular congestion and mild cardiomegaly. Minimal bibasilar opacities likely reflect atelectasis. Electronically Signed   By: Garald Balding M.D.   On: 05/25/2017 02:17   Dg Lumbar Spine Complete  Result Date: 04/27/2017 CLINICAL DATA:  Low back pain EXAM: LUMBAR SPINE - COMPLETE 4+ VIEW COMPARISON:   01/22/2017 CT FINDINGS: There is no evidence of lumbar spine fracture. Alignment is normal. Intervertebral disc spaces are maintained. IMPRESSION: Negative. Electronically Signed   By: Rolm Baptise M.D.   On: 04/27/2017 11:24   Ct Head Wo Contrast  Result Date: 05/25/2017 CLINICAL DATA:  46 y/o  M; loss of consciousness and vomiting. EXAM: CT HEAD WITHOUT CONTRAST TECHNIQUE: Contiguous axial images were obtained from the base of the skull through the vertex without intravenous contrast. COMPARISON:  09/11/2016 CT head FINDINGS: Brain: No evidence of acute infarction, hemorrhage, hydrocephalus, extra-axial collection or mass lesion/mass  effect. Vascular: No hyperdense vessel or unexpected calcification. Skull: Normal. Negative for fracture or focal lesion. Sinuses/Orbits: No acute finding. Other: None. IMPRESSION: No acute intracranial abnormality identified. Unremarkable CT of the head. Electronically Signed   By: Kristine Garbe M.D.   On: 05/25/2017 01:55   Ct Angio Neck W And/or Wo Contrast  Result Date: 05/25/2017 CLINICAL DATA:  46 y/o  M; syncope. EXAM: CT ANGIOGRAPHY HEAD AND NECK TECHNIQUE: Multidetector CT imaging of the head and neck was performed using the standard protocol during bolus administration of intravenous contrast. Multiplanar CT image reconstructions and MIPs were obtained to evaluate the vascular anatomy. Carotid stenosis measurements (when applicable) are obtained utilizing NASCET criteria, using the distal internal carotid diameter as the denominator. CONTRAST:  50 cc Isovue 370 COMPARISON:  None. FINDINGS: CTA NECK FINDINGS Aortic arch: Standard branching. Imaged portion shows no evidence of aneurysm or dissection. No significant stenosis of the major arch vessel origins. Right carotid system: No evidence of dissection, stenosis (50% or greater) or occlusion. Left carotid system: No evidence of dissection, stenosis (50% or greater) or occlusion. Vertebral arteries: Left  dominant. No evidence of dissection, stenosis (50% or greater) or occlusion. Skeleton: Negative. Other neck: Negative. Upper chest: Negative. Review of the MIP images confirms the above findings CTA HEAD FINDINGS Anterior circulation: No significant stenosis, proximal occlusion, aneurysm, or vascular malformation. Posterior circulation: No significant stenosis, proximal occlusion, aneurysm, or vascular malformation. Venous sinuses: As permitted by contrast timing, patent. Anatomic variants: No posterior communicating artery identified, likely hypoplastic or absent. Small anterior communicating artery. Delayed phase: No abnormal intracranial enhancement. Review of the MIP images confirms the above findings IMPRESSION: 1. Patent carotid and vertebral arteries. No dissection, aneurysm, or significant stenosis is identified. 2. Patent circle of Willis. No large vessel occlusion, aneurysm, or significant stenosis is identified. Electronically Signed   By: Kristine Garbe M.D.   On: 05/25/2017 03:08      Procedures Procedures (including critical care time)  Medications Ordered in ED Medications  sodium chloride 0.9 % bolus 1,000 mL (1,000 mLs Intravenous New Bag/Given 05/25/17 0130)  metoCLOPramide (REGLAN) injection 10 mg (10 mg Intravenous Given 05/25/17 0130)  diphenhydrAMINE (BENADRYL) injection 25 mg (25 mg Intravenous Given 05/25/17 0129)     Date: 05/25/2017  Rate:72   Rhythm: normal sinus rhythm  QRS Axis: normal  Intervals: normal  ST/T Wave abnormalities: normal  Conduction Disutrbances: none  Narrative Interpretation: unremarkable     Final Clinical Impressions(s) / ED Diagnoses  I suspect that the entire illness is viral in nature.  We CT and angio the patient within 6 hours and given there is no meningismus and the patient is comfortable I see no indication for LP at this time.  I do not believe the symptoms to be infectious.  Strict return precautions given. Return immediately  for fever >101, altered level of consciousness,bleeding or any concerns. Follow up with your own doctor for ongoing concerns.   The patient is nontoxic-appearing on exam and vital signs are within normal limits.   I have reviewed the triage vital signs and the nursing notes. Pertinent labs &imaging results that were available during my care of the patient were reviewed by me and considered in my medical decision making (see chart for details).  After history, exam, and medical workup I feel the patient has been appropriately medically screened and is safe for discharge home. Pertinent diagnoses were discussed with the patient. Patient was given return precautions.  Semira Stoltzfus, MD 05/25/17 [phone]

## 2017-05-25 NOTE — ED Triage Notes (Signed)
Pt reports he has been at work since about noon. About 6pm he was smoking a cigarette and passed out. He had onset of headache 15 minutes before he passed out and having vomiting after.   Pt says that he has also been having diarrhea for about 2-3 days.

## 2017-10-23 ENCOUNTER — Emergency Department (HOSPITAL_BASED_OUTPATIENT_CLINIC_OR_DEPARTMENT_OTHER)
Admission: EM | Admit: 2017-10-23 | Discharge: 2017-10-23 | Disposition: A | Discharge status: Home / Self Care | Payer: Self-pay | Attending: Emergency Medicine | Admitting: Emergency Medicine

## 2017-10-23 ENCOUNTER — Other Ambulatory Visit: Payer: Self-pay

## 2017-10-23 ENCOUNTER — Emergency Department (HOSPITAL_BASED_OUTPATIENT_CLINIC_OR_DEPARTMENT_OTHER): Payer: Self-pay

## 2017-10-23 ENCOUNTER — Encounter (HOSPITAL_BASED_OUTPATIENT_CLINIC_OR_DEPARTMENT_OTHER): Payer: Self-pay | Admitting: *Deleted

## 2017-10-23 DIAGNOSIS — X58XXXA Exposure to other specified factors, initial encounter: Secondary | ICD-10-CM | POA: Insufficient documentation

## 2017-10-23 DIAGNOSIS — Z791 Long term (current) use of non-steroidal anti-inflammatories (NSAID): Secondary | ICD-10-CM | POA: Insufficient documentation

## 2017-10-23 DIAGNOSIS — Y999 Unspecified external cause status: Secondary | ICD-10-CM | POA: Insufficient documentation

## 2017-10-23 DIAGNOSIS — Y929 Unspecified place or not applicable: Secondary | ICD-10-CM | POA: Insufficient documentation

## 2017-10-23 DIAGNOSIS — Y9389 Activity, other specified: Secondary | ICD-10-CM | POA: Insufficient documentation

## 2017-10-23 DIAGNOSIS — J45909 Unspecified asthma, uncomplicated: Secondary | ICD-10-CM | POA: Insufficient documentation

## 2017-10-23 DIAGNOSIS — F17210 Nicotine dependence, cigarettes, uncomplicated: Secondary | ICD-10-CM | POA: Insufficient documentation

## 2017-10-23 DIAGNOSIS — S92511A Displaced fracture of proximal phalanx of right lesser toe(s), initial encounter for closed fracture: Secondary | ICD-10-CM | POA: Insufficient documentation

## 2017-10-23 DIAGNOSIS — Z79899 Other long term (current) drug therapy: Secondary | ICD-10-CM | POA: Insufficient documentation

## 2017-10-23 IMAGING — DX DG FOOT COMPLETE 3+V*R*
3 series · 3 of 3 positions shown · non-contrast
Comparison: None.

CLINICAL DATA: Trauma to the foot with bruising

EXAM:
RIGHT FOOT COMPLETE - 3+ VIEW

[foot ap]
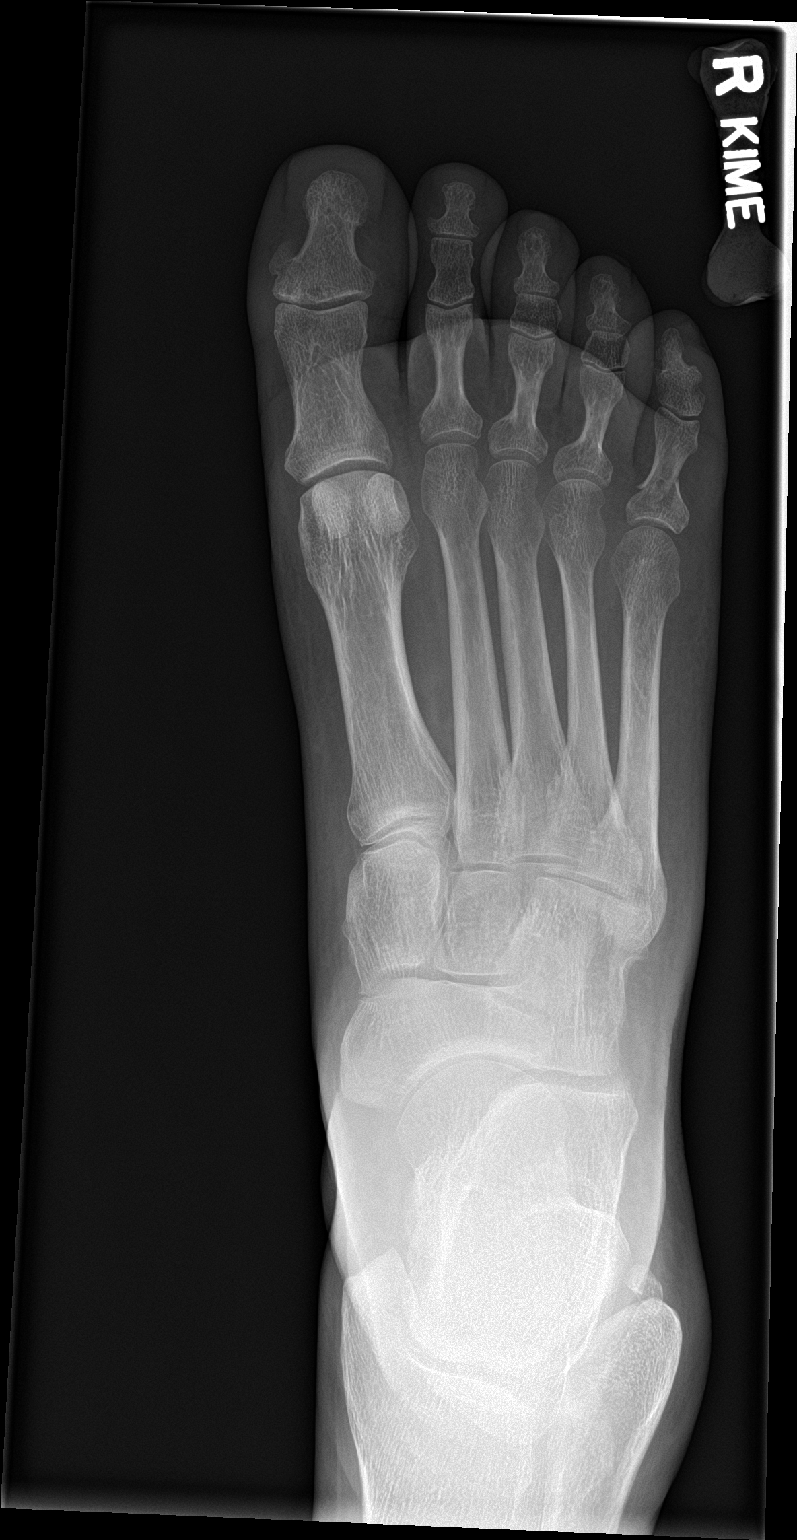

[foot obl]
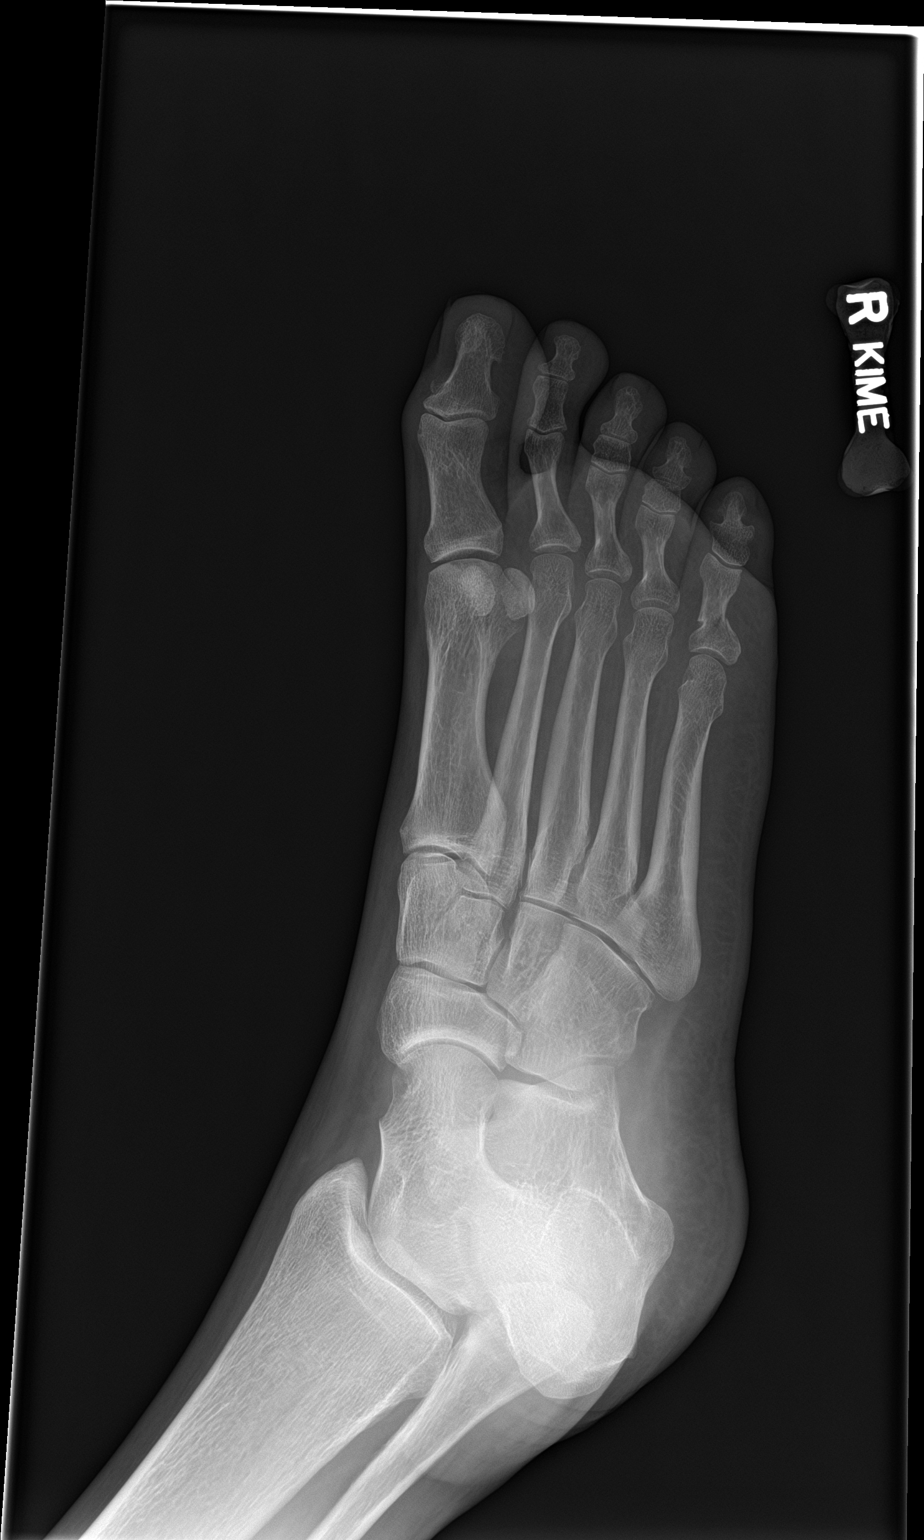

[foot lat]
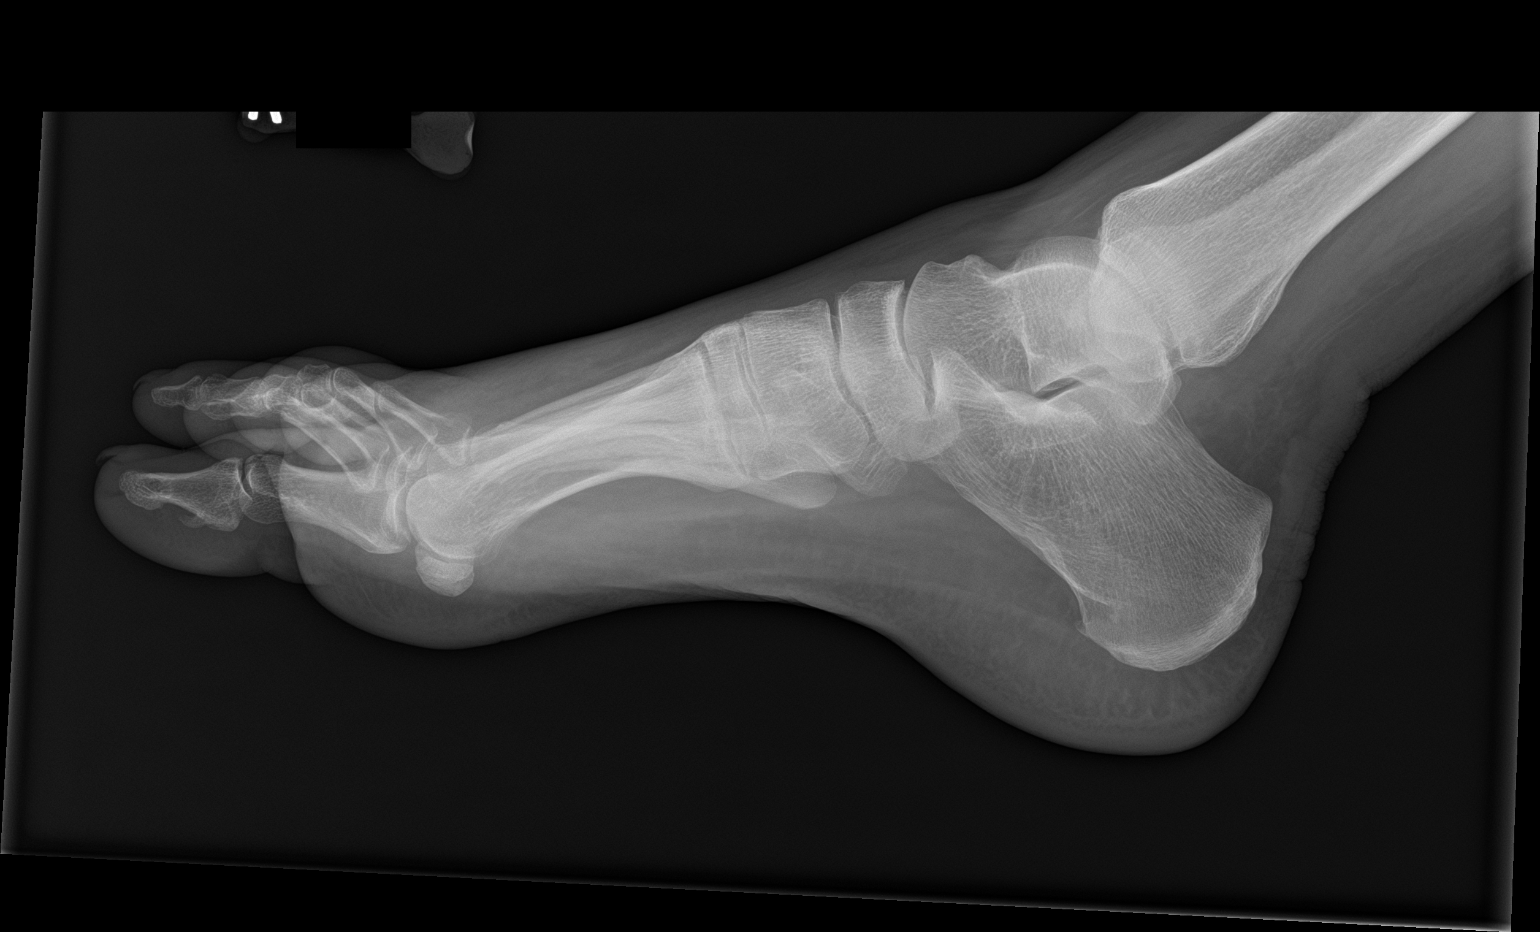

[3 of 3 positions shown; findings below may reference images not displayed]

FINDINGS: Acute fracture involving the proximal shaft and metaphysis of the
proximal phalanx. No definitive articular extension. Less than [DATE]
shaft diameter of medial displacement of distal fracture fragment.
No significant angulation. No subluxation
IMPRESSION: Acute, minimally displaced fracture involving the proximal aspect of
the fifth proximal phalanx.

## 2017-10-23 MED ORDER — HYDROCODONE-ACETAMINOPHEN 5-325 MG PO TABS: 1.0000 | ORAL_TABLET | Freq: Four times a day (QID) | ORAL | 0 refills | Status: DC | PRN

## 2017-10-23 NOTE — Discharge Instructions (Signed)
Norco and nsaids as needed for pain. Ice affected area (see instructions below).  Please call the orthopedic physician listed today or first thing in the morning to schedule a follow up appointment.   Fractures generally take 4-6 weeks to heal. It is very important to keep your splint dry until your follow up with the orthopedic doctor and a cast can be applied. You may place a plastic bag around the extremity with the splint while bathing to keep it dry. Also try to sleep with the extremity elevated for the next several nights to decrease swelling. Check the fingertips and toes several times per day to make sure they are not cold, pale, or blue. If this is the case, the splint may be too tight and should return to the ER, your regular doctor or the orthopedist for recheck. Return to the ER for new or worsening symptoms, any additional concerns.   COLD THERAPY DIRECTIONS:  Ice or gel packs can be used to reduce both pain and swelling. Ice is the most helpful within the first 24 to 48 hours after an injury or flareup from overusing a muscle or joint.  Ice is effective, has very few side effects, and is safe for most people to use.   If you expose your skin to cold temperatures for too long or without the proper protection, you can damage your skin or nerves. Watch for signs of skin damage due to cold.   HOME CARE INSTRUCTIONS  Follow these tips to use ice and cold packs safely.  Place a dry or damp towel between the ice and skin. A damp towel will cool the skin more quickly, so you may need to shorten the time that the ice is used.  For a more rapid response, add gentle compression to the ice.  Ice for no more than 10 to 20 minutes at a time. The bonier the area you are icing, the less time it will take to get the benefits of ice.  Check your skin after 5 minutes to make sure there are no signs of a poor response to cold or skin damage.  Rest 20 minutes or more in between uses.  Once your skin is  numb, you can end your treatment. You can test numbness by very lightly touching your skin. The touch should be so light that you do not see the skin dimple from the pressure of your fingertip. When using ice, most people will feel these normal sensations in this order: cold, burning, aching, and numbness.

## 2017-10-23 NOTE — ED Triage Notes (Signed)
Last night he hit his right foot on an unknown object. Bruising, swelling and pain today.

## 2017-10-23 NOTE — ED Provider Notes (Signed)
[facility] EMERGENCY DEPARTMENT Provider Note   CSN: [id number] Arrival date & time: 10/23/17  2052     History   Chief Complaint Chief Complaint  Patient presents with  . Foot Injury    HPI Alan Sanders is a 46 y.o. male.  HPI 46 year old Caucasian male with no pertinent past medical history presents to the emergency department today with complaints of right foot pain.  Patient states that he was walking to put laundry in the washer last night when he hit his foot on unknown object.  States that he worked last night all night on his feet.  States that this morning when he got home he noticed some bruising and swelling to his right little toe into his right side of his foot.  States that the pain is worse with palpation, ambulation and range of motion.  Patient states he is able to walk however this does cause him pain.  He does have crutches at bedside which she has been using.  Patient denies any associated numbness or weakness.  Does have an orthopedic doctor that he sees for his left knee.  Patient denies any other complaints including right ankle or right knee pain at this time.  Not taking for the pain prior to arrival. Past Medical History:  Diagnosis Date  . Arthritis    " in my back "  . Asthma   . GERD (gastroesophageal reflux disease)   . Seizures ([place])    " its been along time ,since my last seizure "  . Spleen enlarged   . Thrombocytopenia ([place])   . Tobacco abuse     Patient Active Problem List   Diagnosis Date Noted  . Cough 02/13/2015  . Thrombocytopenia ([place]) 02/13/2015  . Chest pain 02/12/2015  . Asthma 02/12/2015  . Tobacco abuse 02/12/2015  . Nausea vomiting and diarrhea 02/12/2015  . Pain in the chest     Past Surgical History:  Procedure Laterality Date  . HERNIA REPAIR         Home Medications    Prior to Admission medications   Medication Sig Start Date End Date Taking? Authorizing Provider  Homeopathic Products (BHI BACK  PAIN RELIEF) TABS Take 2 tablets by mouth every 8 (eight) hours as needed (for pain).    [provider]  ibuprofen (ADVIL,MOTRIN) 800 MG tablet Take 1 tablet (800 mg total) by mouth 3 (three) times daily. 05/25/17   Palumbo, April, MD  methocarbamol (ROBAXIN) 500 MG tablet Take 2 tablets (1,000 mg total) by mouth every 8 (eight) hours as needed for muscle spasms. Patient not taking: Reported on 04/27/2017 02/04/17   Julianne Rice, MD  methocarbamol (ROBAXIN) 500 MG tablet Take 2 tablets (1,000 mg total) by mouth 3 (three) times daily as needed for muscle spasms. 04/27/17   Lajean Saver, MD  ondansetron (ZOFRAN ODT) 4 MG disintegrating tablet 4mg  ODT q8 hours prn nausea/vomit 05/25/17   Palumbo, April, MD    Family History Family History  Problem Relation Age of Onset  . Diabetes Mother   . Hypertension Mother   . COPD Mother     Social History Social History   Tobacco Use  . Smoking status: Current Every Day Smoker    Packs/day: 0.50    Years: 24.00    Pack years: 12.00    Types: Cigarettes  . Smokeless tobacco: Never Used  Substance Use Topics  . Alcohol use: No  . Drug use: No     Allergies  Patient has no known allergies.   Review of Systems Review of Systems  Musculoskeletal: Positive for arthralgias, gait problem, joint swelling and myalgias.  Skin: Positive for color change.  Neurological: Negative for weakness and numbness.     Physical Exam Updated Vital Signs BP (!) 139/93 (BP Location: Right Arm)   Pulse 93   Temp 97.9 F (36.6 C) (Oral)   Resp 18   Ht 6\' 1"  (1.854 m)   Wt 107.5 kg (237 lb)   SpO2 100%   BMI 31.27 kg/m   Physical Exam  Constitutional: He is oriented to person, place, and time. He appears well-developed and well-nourished. No distress.  HENT:  Head: Normocephalic and atraumatic.  Eyes: Right eye exhibits no discharge. Left eye exhibits no discharge. No scleral icterus.  Neck: Normal range of motion.  Cardiovascular: Intact  distal pulses.  Pulmonary/Chest: No respiratory distress.  Musculoskeletal: Normal range of motion.  Patient has bruising noted to the fifth right toe with associated edema.  He does have some edema and ecchymosis to the right metatarsal region.  Full range of motion of the right ankle without any swelling.  Patient does have pain with range of motion of the right fifth digit.  Cap refill is brisk.  Sensation intact.  DP pulses are 2+ bilaterally.  No bruising to the plantar aspect of the foot.  Neurological: He is alert and oriented to person, place, and time.  Skin: Skin is warm and dry. Capillary refill takes less than 2 seconds. No pallor.  Psychiatric: His behavior is normal. Judgment and thought content normal.  Nursing note and vitals reviewed.    ED Treatments / Results  Labs (all labs ordered are listed, but only abnormal results are displayed) Labs Reviewed - No data to display  EKG  EKG Interpretation None       Radiology Dg Foot Complete Right  Result Date: 10/23/2017 CLINICAL DATA:  Trauma to the foot with bruising EXAM: RIGHT FOOT COMPLETE - 3+ VIEW COMPARISON:  None. FINDINGS: Acute fracture involving the proximal shaft and metaphysis of the proximal phalanx. No definitive articular extension. Less than 1/4 shaft diameter of medial displacement of distal fracture fragment. No significant angulation. No subluxation IMPRESSION: Acute, minimally displaced fracture involving the proximal aspect of the fifth proximal phalanx. Electronically Signed   By: Donavan Foil M.D.   On: 10/23/2017 21:33    Procedures Procedures (including critical care time)  Medications Ordered in ED Medications - No data to display   Initial Impression / Assessment and Plan / ED Course  I have reviewed the triage vital signs and the nursing notes.  Pertinent labs & imaging results that were available during my care of the patient were reviewed by me and considered in my medical decision  making (see chart for details).     Patient presented to the ED with complaints of right toe pain.  Patient hit the toe on unknown object last night.  Reports associated ecchymosis, pain and edema.  Patient is neurovascularly intact.  He has no pain with range of motion of the right ankle.  X-ray revealed an acute minimally displaced fracture involving the proximal aspect of the fifth proximal phalanx.  No intra-articular involvement.  No open fracture noted.  Patient has an orthopedic doctor that he is following up with.  Will buddy tape patient and give him postop shoe.  Patient has crutches at bedside.  Encourage symptomatic treatment at home.  We will give him a short course  of pain medicine.  Will follow with orthopedic doctor next week if symptoms are not improving.  Final Clinical Impressions(s) / ED Diagnoses   Final diagnoses:  Closed displaced fracture of proximal phalanx of lesser toe of right foot, initial encounter    ED Discharge Orders        Ordered    HYDROcodone-acetaminophen (NORCO) 5-325 MG tablet  Every 6 hours PRN     10/23/17 2234       Doristine Devoid, PA-C 10/23/17 2235    Forde Dandy, MD 10/24/17 [phone]

## 2017-11-10 ENCOUNTER — Encounter (HOSPITAL_BASED_OUTPATIENT_CLINIC_OR_DEPARTMENT_OTHER): Payer: Self-pay | Admitting: Emergency Medicine

## 2017-11-10 ENCOUNTER — Emergency Department (HOSPITAL_BASED_OUTPATIENT_CLINIC_OR_DEPARTMENT_OTHER): Payer: Self-pay

## 2017-11-10 ENCOUNTER — Other Ambulatory Visit: Payer: Self-pay

## 2017-11-10 ENCOUNTER — Emergency Department (HOSPITAL_BASED_OUTPATIENT_CLINIC_OR_DEPARTMENT_OTHER)
Admission: EM | Admit: 2017-11-10 | Discharge: 2017-11-10 | Disposition: A | Discharge status: Home / Self Care | Payer: Self-pay | Attending: Emergency Medicine | Admitting: Emergency Medicine

## 2017-11-10 DIAGNOSIS — R112 Nausea with vomiting, unspecified: Secondary | ICD-10-CM | POA: Insufficient documentation

## 2017-11-10 DIAGNOSIS — F17210 Nicotine dependence, cigarettes, uncomplicated: Secondary | ICD-10-CM | POA: Insufficient documentation

## 2017-11-10 DIAGNOSIS — R197 Diarrhea, unspecified: Secondary | ICD-10-CM | POA: Insufficient documentation

## 2017-11-10 DIAGNOSIS — R1084 Generalized abdominal pain: Secondary | ICD-10-CM | POA: Insufficient documentation

## 2017-11-10 DIAGNOSIS — J45909 Unspecified asthma, uncomplicated: Secondary | ICD-10-CM | POA: Insufficient documentation

## 2017-11-10 LAB — COMPREHENSIVE METABOLIC PANEL
ALBUMIN: 4.7 g/dL (ref 3.5–5.0)
ALK PHOS: 80 U/L (ref 38–126)
ALT: 84 U/L — AB (ref 17–63)
AST: 45 U/L — AB (ref 15–41)
Anion gap: 7 (ref 5–15)
BUN: 9 mg/dL (ref 6–20)
CALCIUM: 9.4 mg/dL (ref 8.9–10.3)
CO2: 25 mmol/L (ref 22–32)
CREATININE: 0.75 mg/dL (ref 0.61–1.24)
Chloride: 103 mmol/L (ref 101–111)
GFR calc non Af Amer: >60 mL/min (ref >60)
GLUCOSE: 104 mg/dL — AB (ref 65–99)
Potassium: 3.7 mmol/L (ref 3.5–5.1)
SODIUM: 135 mmol/L (ref 135–145)
Total Bilirubin: 0.4 mg/dL (ref 0.3–1.2)
Total Protein: 7.8 g/dL (ref 6.5–8.1)

## 2017-11-10 LAB — URINALYSIS, ROUTINE W REFLEX MICROSCOPIC
BILIRUBIN URINE: NEGATIVE
GLUCOSE, UA: NEGATIVE mg/dL
HGB URINE DIPSTICK: NEGATIVE
Ketones, ur: NEGATIVE mg/dL
Leukocytes, UA: NEGATIVE
Nitrite: NEGATIVE
PROTEIN: NEGATIVE mg/dL
Specific Gravity, Urine: 1.015 (ref 1.005–1.030)
pH: 6 (ref 5.0–8.0)

## 2017-11-10 LAB — CBC
HCT: 46.4 % (ref 39.0–52.0)
Hemoglobin: 16.4 g/dL (ref 13.0–17.0)
MCH: 29.2 pg (ref 26.0–34.0)
MCHC: 35.3 g/dL (ref 30.0–36.0)
MCV: 82.7 fL (ref 78.0–100.0)
PLATELETS: 138 10*3/uL — AB (ref 150–400)
RBC: 5.61 MIL/uL (ref 4.22–5.81)
RDW: 14.7 % (ref 11.5–15.5)
WBC: 9.1 10*3/uL (ref 4.0–10.5)

## 2017-11-10 LAB — LIPASE, BLOOD: Lipase: 35 U/L (ref 11–51)

## 2017-11-10 IMAGING — CT CT RENAL STONE PROTOCOL
2 of 4 series · 16 of 46 positions shown, 18 images · non-contrast
Comparison: [DATE]

CLINICAL DATA: Lower abdominal pain with nausea, vomiting, and
diarrhea

EXAM:
CT ABDOMEN AND PELVIS WITHOUT CONTRAST
TECHNIQUE: Multidetector CT imaging of the abdomen and pelvis was performed
following the standard protocol without oral or intravenous contrast
material administration.

[Series 2: axial st · axial · 0.94mm/px · z∈[-569,-64]mm · 13 of 111 slices shown, 15 images]
[im 5/111  soft-tissue]
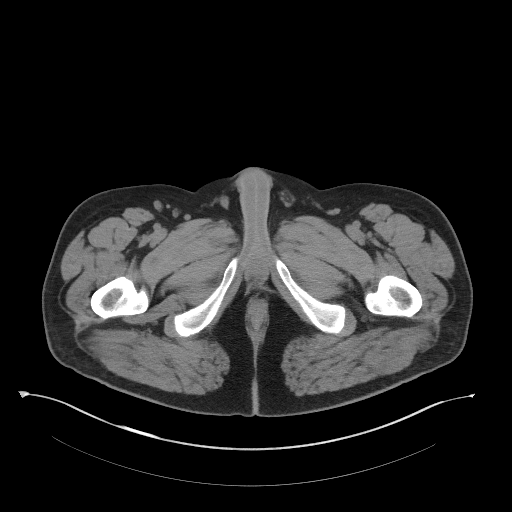
[im 5/111  bone]
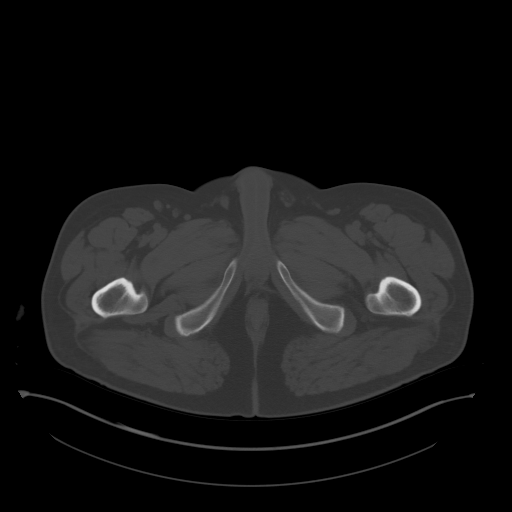
[im 14/111  soft-tissue]
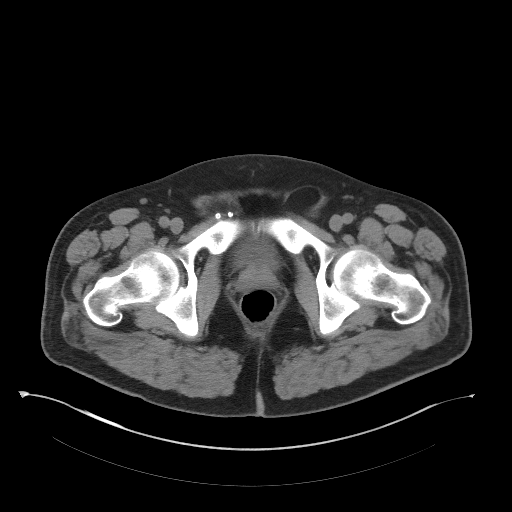
[im 23/111  soft-tissue]
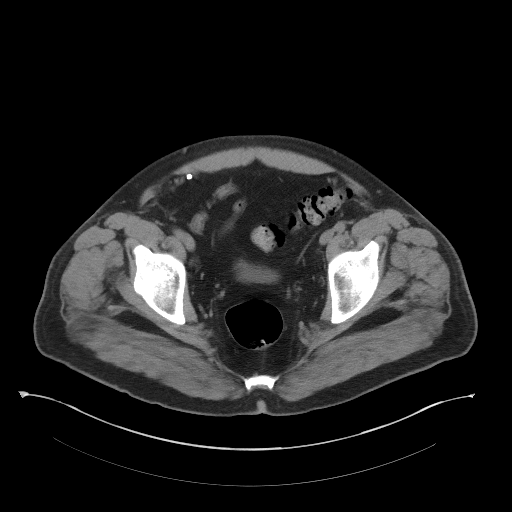
[im 31/111  soft-tissue]
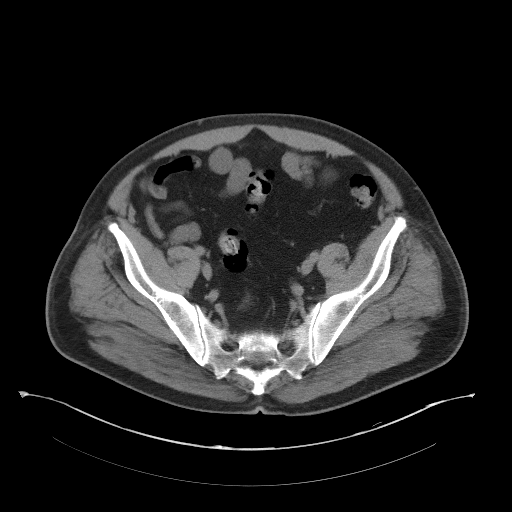
[im 40/111  soft-tissue]
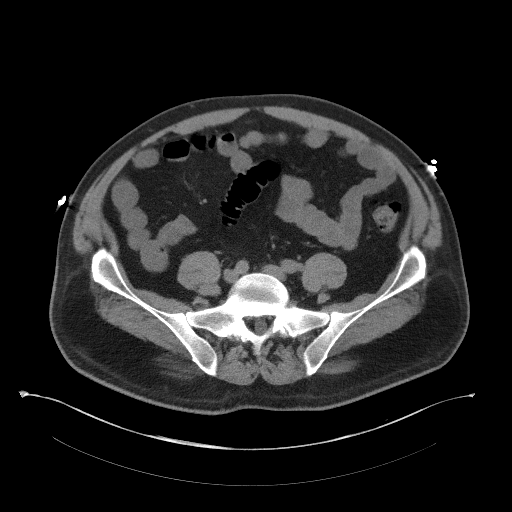
[im 49/111  soft-tissue]
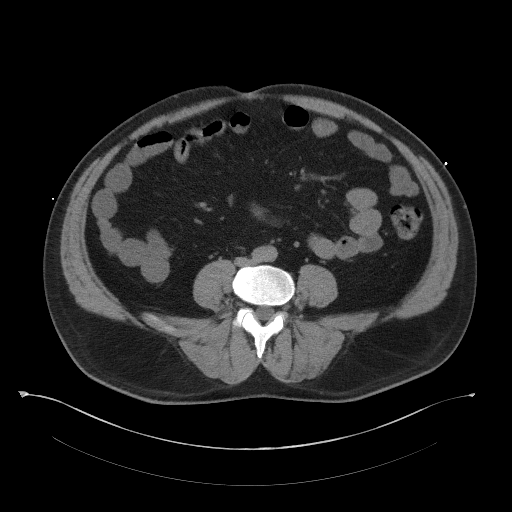
[im 58/111  soft-tissue]
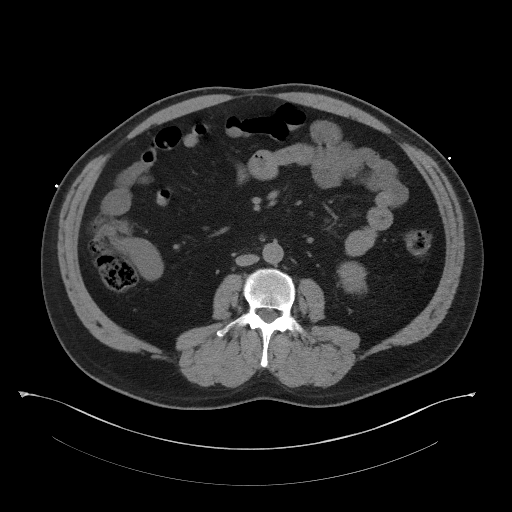
[im 62/111  soft-tissue]
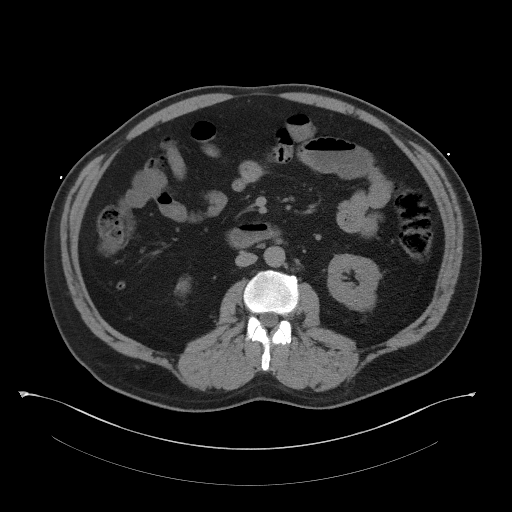
[im 71/111  soft-tissue]
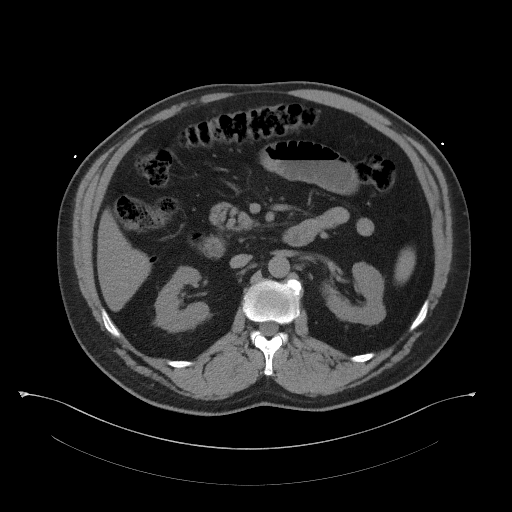
[im 71/111  bone]
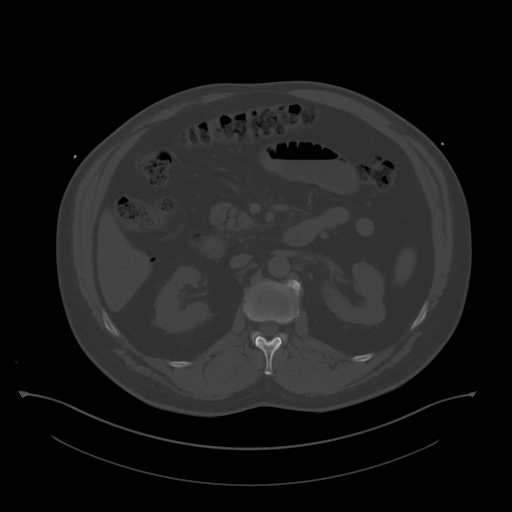
[im 80/111  soft-tissue]
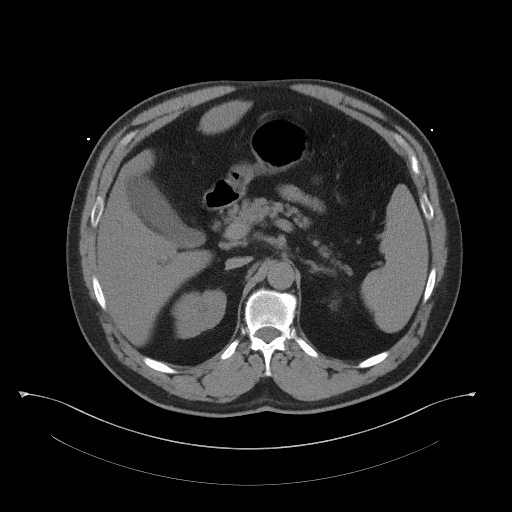
[im 89/111  soft-tissue]
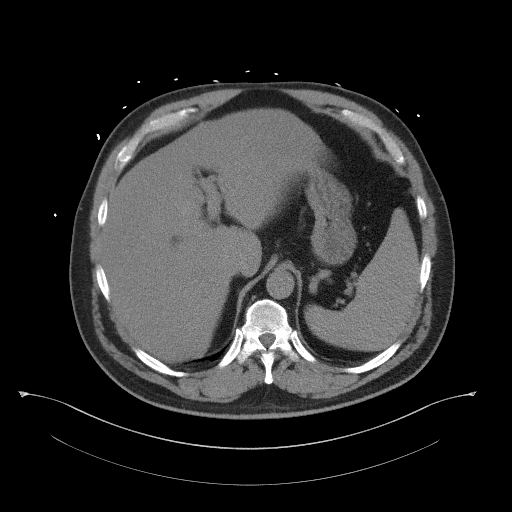
[im 97/111  soft-tissue]
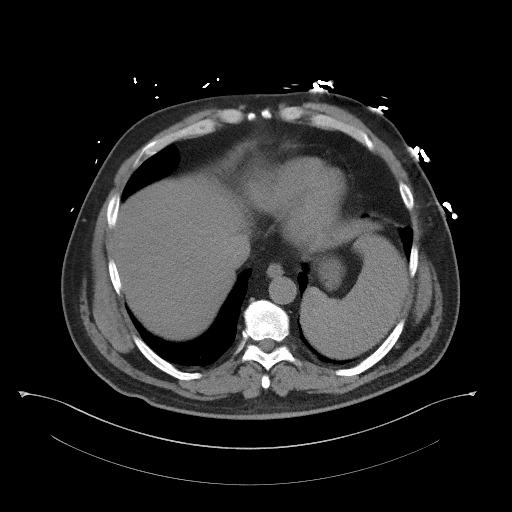
[im 106/111  soft-tissue]
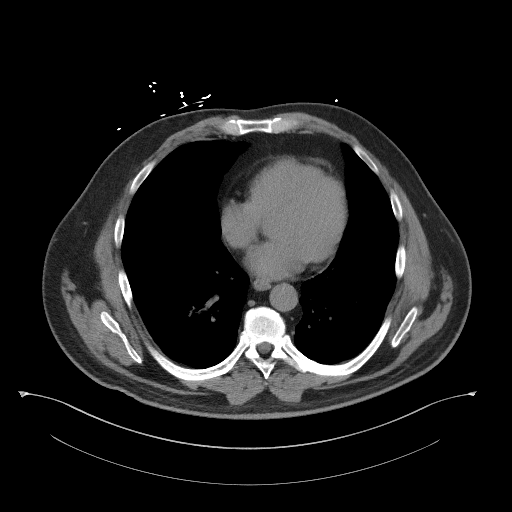

[Series 4: coronal st · coronal · 0.94mm/px · 3 of 105 slices shown]
[im 35/105  soft-tissue]
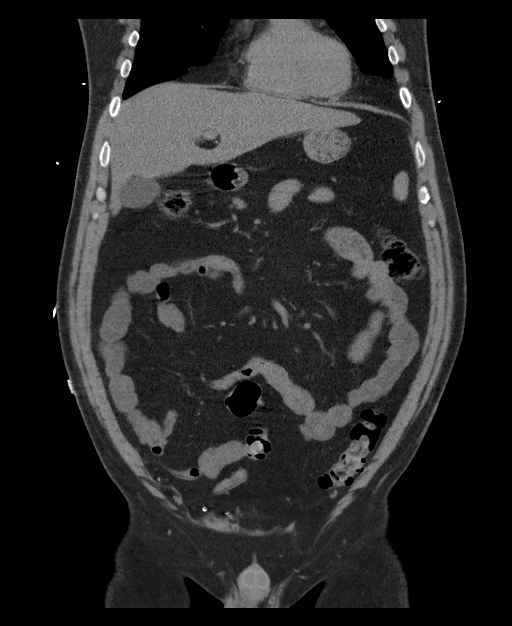
[im 47/105  soft-tissue]
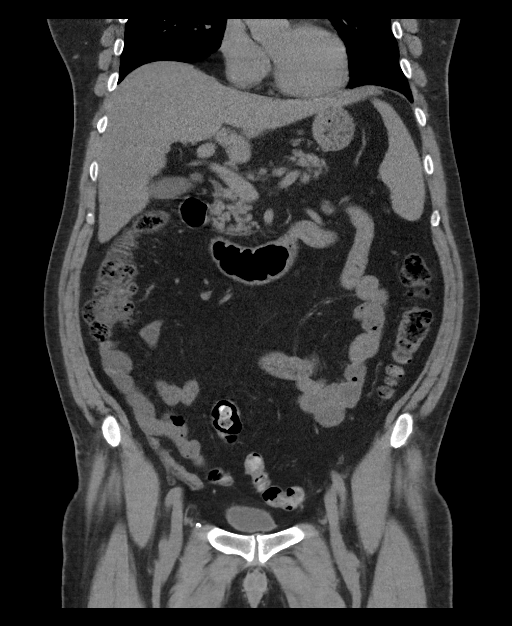
[im 58/105  soft-tissue]
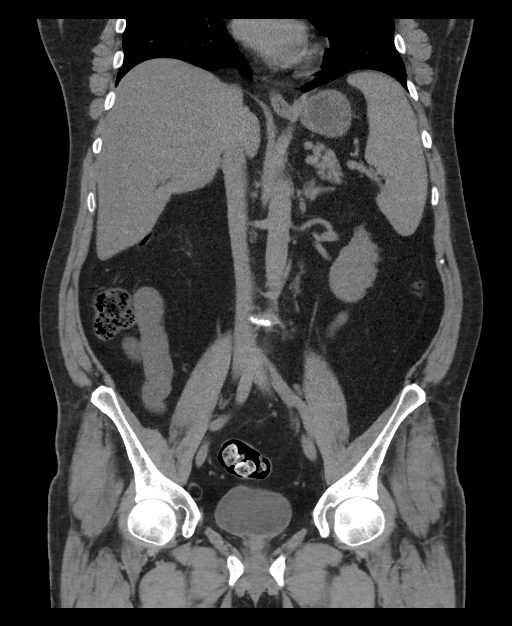

[16 of 46 positions shown; findings below may reference images not displayed]

FINDINGS: Lower chest: There is mild bibasilar atelectatic change.

Hepatobiliary: No focal liver lesions are appreciable on this
noncontrast enhanced study. Gallbladder wall is not appreciably
thickened. There is no biliary duct dilatation.

Pancreas: No pancreatic mass or inflammatory focus.

Spleen: Spleen measures 14.2 x 14.0 x 9.2 cm with a measured splenic
volume of 914 cubic cm. No focal splenic lesions evident.

Adrenals/Urinary Tract: Adrenals appear unremarkable bilaterally.
Kidneys bilaterally show no evident mass or hydronephrosis on either
side. There is no renal or ureteral calculus on either side. Urinary
bladder is midline with wall thickness within normal limits.

Stomach/Bowel: There are a few loops of mildly dilated proximal
jejunum without wall thickening or transition zone. No evident bowel
obstruction. No free air or portal venous air.

Vascular/Lymphatic: There is no abdominal aortic aneurysm. No
vascular lesions are evident in the abdomen or pelvis. There is no
appreciable adenopathy in the abdomen or pelvis.

Reproductive: There are a few prostatic calculi. Prostate and
seminal vesicles are normal in size and contour. There is no pelvic
mass.

Other: Appendix appears normal. Postoperative changes noted in the
right inguinal region, stable. There is fat in the left inguinal
ring. There is no abscess or ascites in the abdomen or pelvis.

Musculoskeletal: There are foci of degenerative change in the lower
lumbar spine. No blastic or lytic bone lesions. No intramuscular or
abdominal wall lesions evident.
IMPRESSION: 1.  Splenomegaly.  No focal splenic lesions evident.

2. Loops of mildly dilated proximal jejunum without wall thickening.
Suspect localized enteritis. No bowel obstruction. No abscess.
Appendix appears normal.

3. Several small prostatic calculi. No renal or ureteral calculus.
No hydronephrosis.

4. Postoperative change along the right inguinal region wall. No
right inguinal hernia. There is fat in the left inguinal ring.

## 2017-11-10 MED ORDER — ONDANSETRON 4 MG PO TBDP: 4.0000 mg | ORAL_TABLET | Freq: Once | ORAL | Status: DC | PRN

## 2017-11-10 MED ORDER — ONDANSETRON 4 MG PO TBDP
ORAL_TABLET | ORAL | 0 refills | Status: DC
Start: 2017-11-10 — End: 2018-02-10

## 2017-11-10 MED ORDER — MORPHINE SULFATE (PF) 4 MG/ML IV SOLN
4.0000 mg | Freq: Once | INTRAVENOUS | Status: AC
  Administered 2017-11-10: 4 mg via INTRAVENOUS
  Filled 2017-11-10: qty 1

## 2017-11-10 MED ORDER — ONDANSETRON HCL 4 MG/2ML IJ SOLN
4.0000 mg | Freq: Once | INTRAMUSCULAR | Status: AC
  Administered 2017-11-10: 4 mg via INTRAVENOUS
  Filled 2017-11-10: qty 2

## 2017-11-10 NOTE — ED Notes (Signed)
ED Provider at bedside. 

## 2017-11-10 NOTE — ED Notes (Signed)
Patient transported to CT 

## 2017-11-10 NOTE — ED Provider Notes (Signed)
[facility] EMERGENCY DEPARTMENT Provider Note   CSN: [id number] Arrival date & time: 11/10/17  1809     History   Chief Complaint Chief Complaint  Patient presents with  . Abdominal Pain    HPI Alan Sanders is a 46 y.o. male.  46 yo M with a chief complaint of diffuse abdominal pain.  This pain is worse mostly on the right side.  He has been having vomiting and diarrhea since yesterday.  Denies fevers or chills.  Describes the pain is constant.  Seems to have times where it gets mildly worse.  Usually worse with coughing or going over bumps in the car.  Has been able to eat and drink but felt that that made his pain significantly worse.  Denies any suspicious food intake.  Denies dark stool or blood in his stool.  Has a history of an umbilical hernia repair but denies other surgery.   The history is provided by the patient.  Abdominal Pain   This is a new problem. The current episode started yesterday. The problem occurs constantly. The problem has been gradually worsening. The pain is located in the generalized abdominal region. The quality of the pain is sharp and shooting. The pain is at a severity of 8/10. The pain is moderate. Associated symptoms include diarrhea and vomiting. Pertinent negatives include fever, headaches, arthralgias and myalgias. The symptoms are aggravated by eating. Nothing relieves the symptoms.    Past Medical History:  Diagnosis Date  . Arthritis    " in my back "  . Asthma   . GERD (gastroesophageal reflux disease)   . Seizures ([place])    " its been along time ,since my last seizure "  . Spleen enlarged   . Thrombocytopenia ([place])   . Tobacco abuse     Patient Active Problem List   Diagnosis Date Noted  . Cough 02/13/2015  . Thrombocytopenia ([place]) 02/13/2015  . Chest pain 02/12/2015  . Asthma 02/12/2015  . Tobacco abuse 02/12/2015  . Nausea vomiting and diarrhea 02/12/2015  . Pain in the chest     Past Surgical History:    Procedure Laterality Date  . HERNIA REPAIR         Home Medications    Prior to Admission medications   Medication Sig Start Date End Date Taking? Authorizing Provider  Homeopathic Products (BHI BACK PAIN RELIEF) TABS Take 2 tablets by mouth every 8 (eight) hours as needed (for pain).    [provider]  HYDROcodone-acetaminophen (NORCO) 5-325 MG tablet Take 1 tablet by mouth every 6 (six) hours as needed. 10/23/17   Doristine Devoid, PA-C  ibuprofen (ADVIL,MOTRIN) 800 MG tablet Take 1 tablet (800 mg total) by mouth 3 (three) times daily. 05/25/17   Palumbo, April, MD  methocarbamol (ROBAXIN) 500 MG tablet Take 2 tablets (1,000 mg total) by mouth every 8 (eight) hours as needed for muscle spasms. Patient not taking: Reported on 04/27/2017 02/04/17   Julianne Rice, MD  methocarbamol (ROBAXIN) 500 MG tablet Take 2 tablets (1,000 mg total) by mouth 3 (three) times daily as needed for muscle spasms. 04/27/17   Lajean Saver, MD  ondansetron (ZOFRAN ODT) 4 MG disintegrating tablet 4mg  ODT q8 hours prn nausea/vomit 05/25/17   Palumbo, April, MD  ondansetron (ZOFRAN ODT) 4 MG disintegrating tablet 4mg  ODT q4 hours prn nausea/vomit 11/10/17   Deno Etienne, DO    Family History Family History  Problem Relation Age of Onset  . Diabetes Mother   .  Hypertension Mother   . COPD Mother     Social History Social History   Tobacco Use  . Smoking status: Current Every Day Smoker    Packs/day: 0.50    Years: 24.00    Pack years: 12.00    Types: Cigarettes  . Smokeless tobacco: Never Used  Substance Use Topics  . Alcohol use: No  . Drug use: No     Allergies   Patient has no known allergies.   Review of Systems Review of Systems  Constitutional: Negative for chills and fever.  HENT: Negative for congestion and facial swelling.   Eyes: Negative for discharge and visual disturbance.  Respiratory: Negative for shortness of breath.   Cardiovascular: Negative for chest pain and  palpitations.  Gastrointestinal: Positive for abdominal pain, diarrhea and vomiting.  Musculoskeletal: Negative for arthralgias and myalgias.  Skin: Negative for color change and rash.  Neurological: Negative for tremors, syncope and headaches.  Psychiatric/Behavioral: Negative for confusion and dysphoric mood.     Physical Exam Updated Vital Signs BP 106/62   Pulse 67   Temp 98.1 F (36.7 C) (Oral)   Resp (!) 22   Ht 6\' 1"  (1.854 m)   Wt 107.5 kg (237 lb)   SpO2 95%   BMI 31.27 kg/m   Physical Exam  Constitutional: He is oriented to person, place, and time. He appears well-developed and well-nourished.  HENT:  Head: Normocephalic and atraumatic.  Eyes: EOM are normal. Pupils are equal, round, and reactive to light.  Neck: Normal range of motion. Neck supple. No JVD present.  Cardiovascular: Normal rate and regular rhythm. Exam reveals no gallop and no friction rub.  No murmur heard. Pulmonary/Chest: No respiratory distress. He has no wheezes.  Abdominal: He exhibits no distension. There is tenderness (mild worst to the RLQ) in the right lower quadrant. There is no rebound and no guarding.  Musculoskeletal: Normal range of motion.  Neurological: He is alert and oriented to person, place, and time.  Skin: No rash noted. No pallor.  Psychiatric: He has a normal mood and affect. His behavior is normal.  Nursing note and vitals reviewed.    ED Treatments / Results  Labs (all labs ordered are listed, but only abnormal results are displayed) Labs Reviewed  COMPREHENSIVE METABOLIC PANEL - Abnormal; Notable for the following components:      Result Value   Glucose, Bld 104 (*)    AST 45 (*)    ALT 84 (*)    All other components within normal limits  CBC - Abnormal; Notable for the following components:   Platelets 138 (*)    All other components within normal limits  LIPASE, BLOOD  URINALYSIS, ROUTINE W REFLEX MICROSCOPIC    EKG  EKG Interpretation None        Radiology Ct Renal Stone Study  Result Date: 11/10/2017 CLINICAL DATA:  Lower abdominal pain with nausea, vomiting, and diarrhea EXAM: CT ABDOMEN AND PELVIS WITHOUT CONTRAST TECHNIQUE: Multidetector CT imaging of the abdomen and pelvis was performed following the standard protocol without oral or intravenous contrast material administration. COMPARISON:  July 09, 2017 FINDINGS: Lower chest: There is mild bibasilar atelectatic change. Hepatobiliary: No focal liver lesions are appreciable on this noncontrast enhanced study. Gallbladder wall is not appreciably thickened. There is no biliary duct dilatation. Pancreas: No pancreatic mass or inflammatory focus. Spleen: Spleen measures 14.2 x 14.0 x 9.2 cm with a measured splenic volume of 914 cubic cm. No focal splenic lesions evident. Adrenals/Urinary Tract:  Adrenals appear unremarkable bilaterally. Kidneys bilaterally show no evident mass or hydronephrosis on either side. There is no renal or ureteral calculus on either side. Urinary bladder is midline with wall thickness within normal limits. Stomach/Bowel: There are a few loops of mildly dilated proximal jejunum without wall thickening or transition zone. No evident bowel obstruction. No free air or portal venous air. Vascular/Lymphatic: There is no abdominal aortic aneurysm. No vascular lesions are evident in the abdomen or pelvis. There is no appreciable adenopathy in the abdomen or pelvis. Reproductive: There are a few prostatic calculi. Prostate and seminal vesicles are normal in size and contour. There is no pelvic mass. Other: Appendix appears normal. Postoperative changes noted in the right inguinal region, stable. There is fat in the left inguinal ring. There is no abscess or ascites in the abdomen or pelvis. Musculoskeletal: There are foci of degenerative change in the lower lumbar spine. No blastic or lytic bone lesions. No intramuscular or abdominal wall lesions evident. IMPRESSION: 1.   Splenomegaly.  No focal splenic lesions evident. 2. Loops of mildly dilated proximal jejunum without wall thickening. Suspect localized enteritis. No bowel obstruction. No abscess. Appendix appears normal. 3. Several small prostatic calculi. No renal or ureteral calculus. No hydronephrosis. 4. Postoperative change along the right inguinal region wall. No right inguinal hernia. There is fat in the left inguinal ring. Electronically Signed   By: Lowella Grip III M.D.   On: 11/10/2017 21:06    Procedures Procedures (including critical care time)  Medications Ordered in ED Medications  ondansetron (ZOFRAN-ODT) disintegrating tablet 4 mg (not administered)  morphine 4 MG/ML injection 4 mg (4 mg Intravenous Given 11/10/17 2031)  ondansetron (ZOFRAN) injection 4 mg (4 mg Intravenous Given 11/10/17 2031)     Initial Impression / Assessment and Plan / ED Course  I have reviewed the triage vital signs and the nursing notes.  Pertinent labs & imaging results that were available during my care of the patient were reviewed by me and considered in my medical decision making (see chart for details).     46 yo M with a chief complaint of abdominal pain.  Worse in the right lower quadrant.  Will obtain a CT scan.   CT scan negative for appendicitis. Will treat as likely viral.  Given Zofran we will have him take Imodium at home.  10:58 PM:  I have discussed the diagnosis/risks/treatment options with the patient and family and believe the pt to be eligible for discharge home to follow-up with PCP. We also discussed returning to the ED immediately if new or worsening sx occur. We discussed the sx which are most concerning (e.g., sudden worsening pain, fever, inability to tolerate by mouth) that necessitate immediate return. Medications administered to the patient during their visit and any new prescriptions provided to the patient are listed below.  Medications given during this visit Medications   ondansetron (ZOFRAN-ODT) disintegrating tablet 4 mg (not administered)  morphine 4 MG/ML injection 4 mg (4 mg Intravenous Given 11/10/17 2031)  ondansetron (ZOFRAN) injection 4 mg (4 mg Intravenous Given 11/10/17 2031)     The patient appears reasonably screen and/or stabilized for discharge and I doubt any other medical condition or other [facility] requiring further screening, evaluation, or treatment in the ED at this time prior to discharge.    Final Clinical Impressions(s) / ED Diagnoses   Final diagnoses:  Nausea vomiting and diarrhea    ED Discharge Orders        Ordered  ondansetron (ZOFRAN ODT) 4 MG disintegrating tablet     11/10/17 2130       Deno Etienne, DO 11/10/17 2258

## 2017-11-10 NOTE — Discharge Instructions (Signed)
Try imodium for diarrhea.

## 2017-11-10 NOTE — ED Triage Notes (Signed)
Patient states that he has had lower abdominal pain with N/V/D x 2 days - reports that it is local to his right lower quardant at this time

## 2018-01-05 ENCOUNTER — Encounter (HOSPITAL_COMMUNITY): Payer: Self-pay | Admitting: Family Medicine

## 2018-01-05 ENCOUNTER — Emergency Department (HOSPITAL_COMMUNITY): Payer: Managed Care, Other (non HMO)

## 2018-01-05 ENCOUNTER — Emergency Department (HOSPITAL_COMMUNITY)
Admission: EM | Admit: 2018-01-05 | Discharge: 2018-01-05 | Disposition: A | Discharge status: Home / Self Care | Payer: Managed Care, Other (non HMO) | Attending: Emergency Medicine | Admitting: Emergency Medicine

## 2018-01-05 DIAGNOSIS — F17210 Nicotine dependence, cigarettes, uncomplicated: Secondary | ICD-10-CM | POA: Diagnosis not present

## 2018-01-05 DIAGNOSIS — B349 Viral infection, unspecified: Secondary | ICD-10-CM | POA: Diagnosis not present

## 2018-01-05 DIAGNOSIS — R69 Illness, unspecified: Secondary | ICD-10-CM

## 2018-01-05 DIAGNOSIS — R05 Cough: Secondary | ICD-10-CM | POA: Diagnosis not present

## 2018-01-05 DIAGNOSIS — R0602 Shortness of breath: Secondary | ICD-10-CM | POA: Diagnosis present

## 2018-01-05 DIAGNOSIS — R197 Diarrhea, unspecified: Secondary | ICD-10-CM | POA: Diagnosis not present

## 2018-01-05 DIAGNOSIS — M7918 Myalgia, other site: Secondary | ICD-10-CM | POA: Insufficient documentation

## 2018-01-05 DIAGNOSIS — R509 Fever, unspecified: Secondary | ICD-10-CM | POA: Diagnosis not present

## 2018-01-05 DIAGNOSIS — J111 Influenza due to unidentified influenza virus with other respiratory manifestations: Secondary | ICD-10-CM | POA: Insufficient documentation

## 2018-01-05 IMAGING — CR DG CHEST 2V
2 series · 2 of 2 positions shown · non-contrast
Comparison: None.

CLINICAL DATA: 46-year-old male smoker with history of asthma
presents with dyspnea and cough x3 days.

EXAM:
CHEST  2 VIEW

[w chest pa]
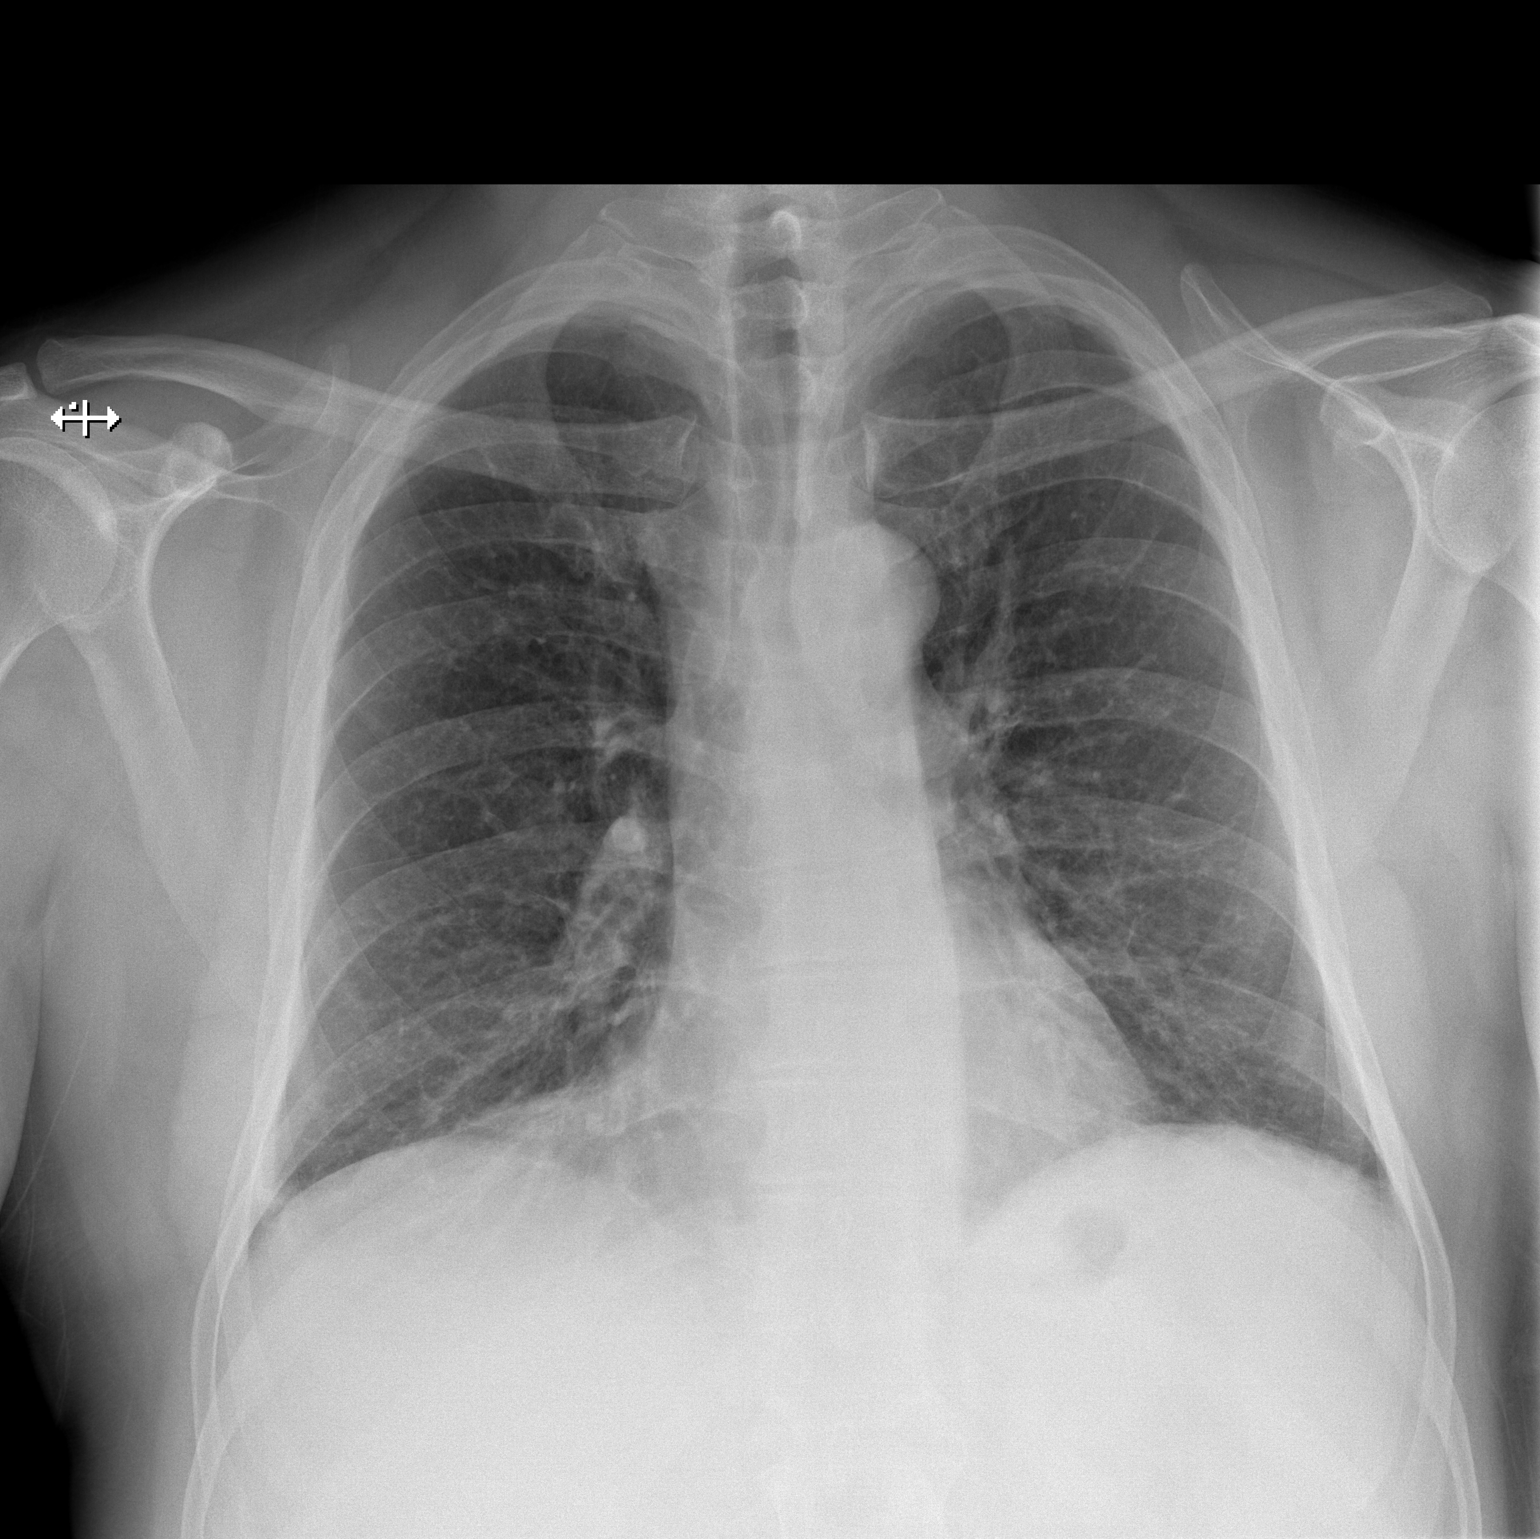

[w chest lat]
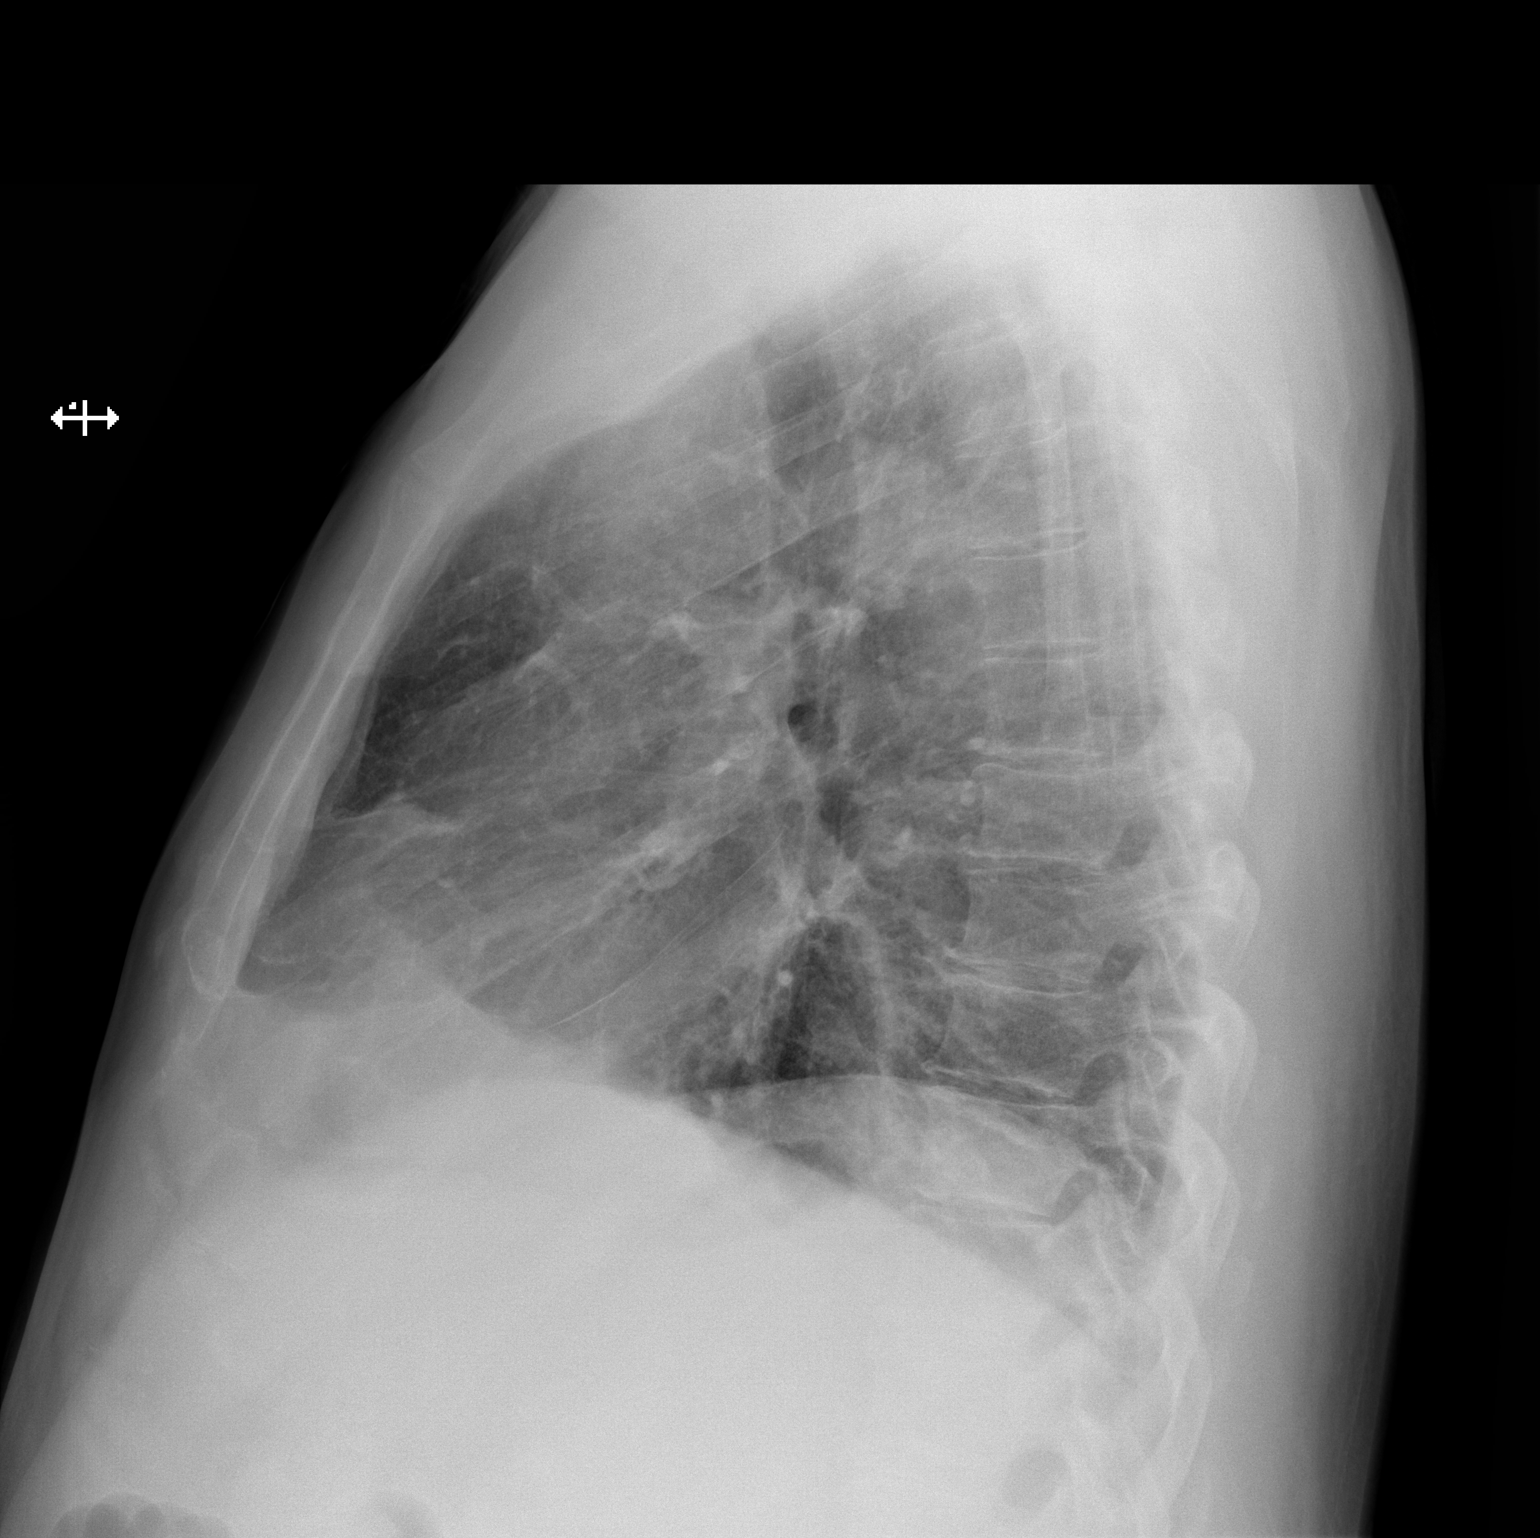

[2 of 2 positions shown; findings below may reference images not displayed]

FINDINGS: The heart size and mediastinal contours are within normal limits.
Mild hyperinflation of the lungs with slight increase in
interstitial lung markings likely reflecting smoking related
bronchitic change. The visualized skeletal structures are
unremarkable.
IMPRESSION: Hyperinflated lungs with mild interstitial prominence likely
representing smoking related bronchitic change.

## 2018-01-05 MED ORDER — ALBUTEROL SULFATE (2.5 MG/3ML) 0.083% IN NEBU
2.5000 mg | INHALATION_SOLUTION | Freq: Once | RESPIRATORY_TRACT | Status: AC
  Administered 2018-01-05: 2.5 mg via RESPIRATORY_TRACT
  Filled 2018-01-05: qty 3

## 2018-01-05 MED ORDER — DIPHENOXYLATE-ATROPINE 2.5-0.025 MG PO TABS
2.0000 | ORAL_TABLET | Freq: Once | ORAL | Status: AC
  Administered 2018-01-05: 2 via ORAL
  Filled 2018-01-05: qty 2

## 2018-01-05 MED ORDER — BENZONATATE 100 MG PO CAPS: 100.0000 mg | ORAL_CAPSULE | Freq: Three times a day (TID) | ORAL | 0 refills | Status: DC

## 2018-01-05 MED ORDER — PREDNISONE 20 MG PO TABS: 20.0000 mg | ORAL_TABLET | Freq: Two times a day (BID) | ORAL | 0 refills | Status: DC

## 2018-01-05 MED ORDER — DIPHENOXYLATE-ATROPINE 2.5-0.025 MG PO TABS: 1.0000 | ORAL_TABLET | Freq: Four times a day (QID) | ORAL | 0 refills | Status: DC | PRN

## 2018-01-05 MED ORDER — BENZONATATE 100 MG PO CAPS
100.0000 mg | ORAL_CAPSULE | Freq: Once | ORAL | Status: AC
  Administered 2018-01-05: 100 mg via ORAL
  Filled 2018-01-05: qty 1

## 2018-01-05 MED ORDER — PREDNISONE 20 MG PO TABS
60.0000 mg | ORAL_TABLET | Freq: Once | ORAL | Status: AC
  Administered 2018-01-05: 60 mg via ORAL
  Filled 2018-01-05: qty 3

## 2018-01-05 NOTE — ED Triage Notes (Signed)
Patient reports he was seen at [facility] ED on Sunday with flu-like symptoms and nausea/vomiting. He reports the test performed came back negative. Patient was seen at urgent care in [place] with a dx of influenza and PNA. Patient was placed on Levofloxacin, Prednisone, and PRN Virtussin. Patient reports he has been taking the medication and he does not feel better. Still experiencing all the symptoms except he started experiencing diarrhea.

## 2018-01-05 NOTE — Discharge Instructions (Signed)
Continue your cough syrup. Stop Levaquin, your x-ray is negative, you do not have pneumonia Tessalon, additional cough medication. Prednisone, steroid to help with your wheezing. Continue albuterol as needed.

## 2018-01-05 NOTE — ED Provider Notes (Signed)
[facility] DEPT Provider Note   CSN: [id number] Arrival date & time: 01/05/18  1805     History   Chief Complaint Chief Complaint  Patient presents with  . Shortness of Breath    HPI Alan Sanders is a 47 y.o. male.  Chief complaint is cough, fever, body aches, abdominal pain.  HPI: 47 year old male.  4-day illness.  Seen 2 days ago at [facility].  Had a negative CT scan of his chest and abdomen.  Was told that his x-ray was normal and that an influenza test was negative.  He was discharged with cough medication.  He was seen 12 hours later in urgent care.  He was told that he had a pneumonia and "class a flu".  Was given Levaquin and codeine-based cough syrup.  Continues to cough.  Continues to have body aches.  Now has some diarrhea and abdominal cramping and presents here.  Was not immunized for flu.  2 episodes of diarrhea today.  No blood pus or mucus in the stools.  One episode of vomiting earlier today.  No nausea now.     Past Medical History:  Diagnosis Date  . Arthritis    " in my back "  . Asthma   . GERD (gastroesophageal reflux disease)   . Seizures ([place])    " its been along time ,since my last seizure "  . Spleen enlarged   . Thrombocytopenia ([place])   . Tobacco abuse     Patient Active Problem List   Diagnosis Date Noted  . Cough 02/13/2015  . Thrombocytopenia ([place]) 02/13/2015  . Chest pain 02/12/2015  . Asthma 02/12/2015  . Tobacco abuse 02/12/2015  . Nausea vomiting and diarrhea 02/12/2015  . Pain in the chest     Past Surgical History:  Procedure Laterality Date  . HERNIA REPAIR         Home Medications    Prior to Admission medications   Medication Sig Start Date End Date Taking? Authorizing Provider  albuterol (PROVENTIL HFA;VENTOLIN HFA) 108 (90 Base) MCG/ACT inhaler Inhale 2 puffs into the lungs every 6 (six) hours as needed for wheezing or shortness of breath.   Yes [provider]    guaiFENesin-codeine (ROBITUSSIN AC) 100-10 MG/5ML syrup Take 10 mLs by mouth every 6 (six) hours as needed for cough.   Yes [provider]  levofloxacin (LEVAQUIN) 500 MG tablet Take 500 mg by mouth daily.   Yes [provider]  ondansetron (ZOFRAN-ODT) 8 MG disintegrating tablet Take 8 mg by mouth every 8 (eight) hours as needed for nausea or vomiting.   Yes [provider]  benzonatate (TESSALON) 100 MG capsule Take 1 capsule (100 mg total) by mouth every 8 (eight) hours. 01/05/18   Tanna Furry, MD  diphenoxylate-atropine (LOMOTIL) 2.5-0.025 MG tablet Take 1 tablet by mouth 4 (four) times daily as needed for diarrhea or loose stools. 01/05/18   Tanna Furry, MD  HYDROcodone-acetaminophen (NORCO) 5-325 MG tablet Take 1 tablet by mouth every 6 (six) hours as needed. Patient not taking: Reported on 01/05/2018 10/23/17   Ocie Cornfield T, PA-C  ibuprofen (ADVIL,MOTRIN) 800 MG tablet Take 1 tablet (800 mg total) by mouth 3 (three) times daily. Patient not taking: Reported on 01/05/2018 05/25/17   Randal Buba, April, MD  methocarbamol (ROBAXIN) 500 MG tablet Take 2 tablets (1,000 mg total) by mouth every 8 (eight) hours as needed for muscle spasms. Patient not taking: Reported on 04/27/2017 02/04/17   Lita Mains,  Shanon Brow, MD  methocarbamol (ROBAXIN) 500 MG tablet Take 2 tablets (1,000 mg total) by mouth 3 (three) times daily as needed for muscle spasms. Patient not taking: Reported on 01/05/2018 04/27/17   Lajean Saver, MD  ondansetron (ZOFRAN ODT) 4 MG disintegrating tablet 4mg  ODT q8 hours prn nausea/vomit Patient not taking: Reported on 01/05/2018 05/25/17   Randal Buba, April, MD  ondansetron (ZOFRAN ODT) 4 MG disintegrating tablet 4mg  ODT q4 hours prn nausea/vomit Patient not taking: Reported on 01/05/2018 11/10/17   Deno Etienne, DO  predniSONE (DELTASONE) 20 MG tablet Take 1 tablet (20 mg total) by mouth 2 (two) times daily with a meal. 01/05/18   Tanna Furry, MD    Family History Family  History  Problem Relation Age of Onset  . Diabetes Mother   . Hypertension Mother   . COPD Mother     Social History Social History   Tobacco Use  . Smoking status: Current Every Day Smoker    Packs/day: 0.50    Years: 24.00    Pack years: 12.00    Types: Cigarettes  . Smokeless tobacco: Never Used  Substance Use Topics  . Alcohol use: No  . Drug use: No     Allergies   Patient has no known allergies.   Review of Systems Review of Systems  Constitutional: Positive for appetite change, diaphoresis, fatigue and fever. Negative for chills.  HENT: Negative for mouth sores, sore throat and trouble swallowing.   Eyes: Negative for visual disturbance.  Respiratory: Positive for cough and shortness of breath. Negative for chest tightness and wheezing.   Cardiovascular: Negative for chest pain.  Gastrointestinal: Positive for abdominal pain, diarrhea, nausea and vomiting. Negative for abdominal distention.  Endocrine: Negative for polydipsia, polyphagia and polyuria.  Genitourinary: Negative for dysuria, frequency and hematuria.  Musculoskeletal: Positive for myalgias. Negative for gait problem.  Skin: Negative for color change, pallor and rash.  Neurological: Negative for dizziness, syncope, light-headedness and headaches.  Hematological: Does not bruise/bleed easily.  Psychiatric/Behavioral: Negative for behavioral problems and confusion.     Physical Exam Updated Vital Signs BP 121/87 (BP Location: Right Arm)   Pulse 82   Temp 97.9 F (36.6 C) (Oral)   Resp (!) 22   Ht 6\' 1"  (1.854 m)   Wt 106.6 kg (235 lb)   SpO2 97%   BMI 31.00 kg/m   Physical Exam  Constitutional: He is oriented to person, place, and time. He appears well-developed and well-nourished. No distress.  Awake and alert.  Does not appear toxic or distress.  HENT:  Head: Normocephalic.  Eyes: Conjunctivae are normal. Pupils are equal, round, and reactive to light. No scleral icterus.  Neck:  Normal range of motion. Neck supple. No thyromegaly present.  Cardiovascular: Normal rate and regular rhythm. Exam reveals no gallop and no friction rub.  No murmur heard. Pulmonary/Chest: Effort normal and breath sounds normal. No respiratory distress. He has no wheezes. He has no rales.  Faint wheeze with cough.  No focal diminished breath sounds.  Abdominal: Soft. Bowel sounds are normal. He exhibits no distension. There is no tenderness. There is no rebound.  Musculoskeletal: Normal range of motion.  Neurological: He is alert and oriented to person, place, and time.  Skin: Skin is warm and dry. No rash noted.  Psychiatric: He has a normal mood and affect. His behavior is normal.     ED Treatments / Results  Labs (all labs ordered are listed, but only abnormal results are displayed) Labs Reviewed -  No data to display  EKG  EKG Interpretation None       Radiology Dg Chest 2 View  Result Date: 01/05/2018 CLINICAL DATA:  47 year old male smoker with history of asthma presents with dyspnea and cough x3 days. EXAM: CHEST  2 VIEW COMPARISON:  None. FINDINGS: The heart size and mediastinal contours are within normal limits. Mild hyperinflation of the lungs with slight increase in interstitial lung markings likely reflecting smoking related bronchitic change. The visualized skeletal structures are unremarkable. IMPRESSION: Hyperinflated lungs with mild interstitial prominence likely representing smoking related bronchitic change. Electronically Signed   By: Ashley Royalty M.D.   On: 01/05/2018 19:58    Procedures Procedures (including critical care time)  Medications Ordered in ED Medications  benzonatate (TESSALON) capsule 100 mg (100 mg Oral Given 01/05/18 2219)  predniSONE (DELTASONE) tablet 60 mg (60 mg Oral Given 01/05/18 2218)  albuterol (PROVENTIL) (2.5 MG/3ML) 0.083% nebulizer solution 2.5 mg (2.5 mg Nebulization Given 01/05/18 2219)     Initial Impression / Assessment and Plan  / ED Course  I have reviewed the triage vital signs and the nursing notes.  Pertinent labs & imaging results that were available during my care of the patient were reviewed by me and considered in my medical decision making (see chart for details).   Chest x-ray here negative exam consistent with bronchospasm and symptoms more consistent with influenza.  Skeptical of pneumonia after normal CT of his chest and abdomen on Sunday.  We will stop his Levaquin.  Given breathing treatment here.  P.o. prednisone.  We will add Tessalon to his cough suppressant.  Plan home treatment.  Final Clinical Impressions(s) / ED Diagnoses   Final diagnoses:  Influenza-like illness  Viral syndrome    ED Discharge Orders        Ordered    benzonatate (TESSALON) 100 MG capsule  Every 8 hours     01/05/18 2220    predniSONE (DELTASONE) 20 MG tablet  2 times daily with meals     01/05/18 2220    diphenoxylate-atropine (LOMOTIL) 2.5-0.025 MG tablet  4 times daily PRN     02 /12/19 2220       Tanna Furry, MD 01/05/18 2221

## 2018-01-12 ENCOUNTER — Emergency Department (HOSPITAL_BASED_OUTPATIENT_CLINIC_OR_DEPARTMENT_OTHER)
Admission: EM | Admit: 2018-01-12 | Discharge: 2018-01-12 | Disposition: A | Discharge status: Home / Self Care | Payer: Managed Care, Other (non HMO) | Attending: Emergency Medicine | Admitting: Emergency Medicine

## 2018-01-12 ENCOUNTER — Other Ambulatory Visit: Payer: Self-pay

## 2018-01-12 ENCOUNTER — Emergency Department (HOSPITAL_BASED_OUTPATIENT_CLINIC_OR_DEPARTMENT_OTHER): Payer: Managed Care, Other (non HMO)

## 2018-01-12 ENCOUNTER — Encounter (HOSPITAL_BASED_OUTPATIENT_CLINIC_OR_DEPARTMENT_OTHER): Payer: Self-pay | Admitting: Emergency Medicine

## 2018-01-12 DIAGNOSIS — Z79899 Other long term (current) drug therapy: Secondary | ICD-10-CM | POA: Diagnosis not present

## 2018-01-12 DIAGNOSIS — J111 Influenza due to unidentified influenza virus with other respiratory manifestations: Secondary | ICD-10-CM | POA: Diagnosis not present

## 2018-01-12 DIAGNOSIS — J45909 Unspecified asthma, uncomplicated: Secondary | ICD-10-CM | POA: Insufficient documentation

## 2018-01-12 DIAGNOSIS — F17210 Nicotine dependence, cigarettes, uncomplicated: Secondary | ICD-10-CM | POA: Diagnosis not present

## 2018-01-12 DIAGNOSIS — R079 Chest pain, unspecified: Secondary | ICD-10-CM | POA: Insufficient documentation

## 2018-01-12 DIAGNOSIS — R05 Cough: Secondary | ICD-10-CM | POA: Insufficient documentation

## 2018-01-12 DIAGNOSIS — R0602 Shortness of breath: Secondary | ICD-10-CM

## 2018-01-12 DIAGNOSIS — R197 Diarrhea, unspecified: Secondary | ICD-10-CM | POA: Diagnosis not present

## 2018-01-12 DIAGNOSIS — R112 Nausea with vomiting, unspecified: Secondary | ICD-10-CM | POA: Insufficient documentation

## 2018-01-12 LAB — TROPONIN I: Troponin I: <0.03 ng/mL (ref <0.03)

## 2018-01-12 LAB — COMPREHENSIVE METABOLIC PANEL
ALT: 72 U/L — ABNORMAL HIGH (ref 17–63)
AST: 55 U/L — ABNORMAL HIGH (ref 15–41)
Albumin: 4.8 g/dL (ref 3.5–5.0)
Alkaline Phosphatase: 69 U/L (ref 38–126)
Anion gap: 11 (ref 5–15)
BUN: 8 mg/dL (ref 6–20)
CO2: 23 mmol/L (ref 22–32)
Calcium: 9.5 mg/dL (ref 8.9–10.3)
Chloride: 101 mmol/L (ref 101–111)
Creatinine, Ser: 0.85 mg/dL (ref 0.61–1.24)
GFR calc Af Amer: >60 mL/min (ref >60)
GFR calc non Af Amer: >60 mL/min (ref >60)
Glucose, Bld: 120 mg/dL — ABNORMAL HIGH (ref 65–99)
Potassium: 3.7 mmol/L (ref 3.5–5.1)
Sodium: 135 mmol/L (ref 135–145)
Total Bilirubin: 0.9 mg/dL (ref 0.3–1.2)
Total Protein: 8.2 g/dL — ABNORMAL HIGH (ref 6.5–8.1)

## 2018-01-12 LAB — CBC WITH DIFFERENTIAL/PLATELET
Basophils Absolute: 0 10*3/uL (ref 0.0–0.1)
Basophils Relative: 0 %
Eosinophils Absolute: 0.1 10*3/uL (ref 0.0–0.7)
Eosinophils Relative: 1 %
HCT: 48.1 % (ref 39.0–52.0)
Hemoglobin: 17.5 g/dL — ABNORMAL HIGH (ref 13.0–17.0)
Lymphocytes Relative: 16 %
Lymphs Abs: 1.4 10*3/uL (ref 0.7–4.0)
MCH: 29.5 pg (ref 26.0–34.0)
MCHC: 36.4 g/dL — ABNORMAL HIGH (ref 30.0–36.0)
MCV: 81 fL (ref 78.0–100.0)
Monocytes Absolute: 0.6 10*3/uL (ref 0.1–1.0)
Monocytes Relative: 7 %
Neutro Abs: 6.8 10*3/uL (ref 1.7–7.7)
Neutrophils Relative %: 76 %
Platelets: 155 10*3/uL (ref 150–400)
RBC: 5.94 MIL/uL — ABNORMAL HIGH (ref 4.22–5.81)
RDW: 14.9 % (ref 11.5–15.5)
WBC: 8.9 10*3/uL (ref 4.0–10.5)

## 2018-01-12 LAB — LIPASE, BLOOD: Lipase: 33 U/L (ref 11–51)

## 2018-01-12 MED ORDER — IBUPROFEN 600 MG PO TABS: 600.0000 mg | ORAL_TABLET | Freq: Four times a day (QID) | ORAL | 0 refills | Status: DC | PRN

## 2018-01-12 MED ORDER — IPRATROPIUM-ALBUTEROL 0.5-2.5 (3) MG/3ML IN SOLN
3.0000 mL | Freq: Once | RESPIRATORY_TRACT | Status: AC
  Administered 2018-01-12: 3 mL via RESPIRATORY_TRACT
  Filled 2018-01-12: qty 3

## 2018-01-12 MED ORDER — SODIUM CHLORIDE 0.9 % IV BOLUS (SEPSIS)
1000.0000 mL | Freq: Once | INTRAVENOUS | Status: AC
  Administered 2018-01-12: 1000 mL via INTRAVENOUS

## 2018-01-12 MED ORDER — ACETAMINOPHEN 500 MG PO TABS
1000.0000 mg | ORAL_TABLET | Freq: Once | ORAL | Status: AC
  Administered 2018-01-12: 1000 mg via ORAL
  Filled 2018-01-12: qty 2

## 2018-01-12 MED ORDER — GUAIFENESIN-CODEINE 100-10 MG/5ML PO SYRP: 10.0000 mL | ORAL_SOLUTION | Freq: Four times a day (QID) | ORAL | 0 refills | Status: DC | PRN

## 2018-01-12 MED ORDER — KETOROLAC TROMETHAMINE 30 MG/ML IJ SOLN
30.0000 mg | Freq: Once | INTRAMUSCULAR | Status: AC
Start: 2018-01-12 — End: 2018-01-12
  Administered 2018-01-12: 30 mg via INTRAVENOUS
  Filled 2018-01-12: qty 1

## 2018-01-12 MED ORDER — ACETAMINOPHEN 500 MG PO TABS: 500.0000 mg | ORAL_TABLET | Freq: Four times a day (QID) | ORAL | 0 refills | Status: DC | PRN

## 2018-01-12 MED ORDER — METOCLOPRAMIDE HCL 5 MG/ML IJ SOLN
10.0000 mg | Freq: Once | INTRAMUSCULAR | Status: AC
  Administered 2018-01-12: 10 mg via INTRAVENOUS
  Filled 2018-01-12: qty 2

## 2018-01-12 MED FILL — GUAIATUSSIN AC LIQUID: 100-10 | 3 days supply | Qty: 120 | Fill #0

## 2018-01-12 MED FILL — IBUPROFEN 600 MG TABLET: 600 | 7 days supply | Qty: 30 | Fill #0

## 2018-01-12 MED FILL — ACETAMINOPHEN EXTRA STRENGT: 500 | 25 days supply | Qty: 100 | Fill #0

## 2018-01-12 NOTE — ED Notes (Signed)
ED Provider at bedside. 

## 2018-01-12 NOTE — ED Notes (Signed)
Patient talking in full sentences, no distress noted at this time.

## 2018-01-12 NOTE — ED Notes (Signed)
NAD at this time. Pt is stable and going home.  

## 2018-01-12 NOTE — ED Triage Notes (Signed)
Pt diagnosed with flu and pneumonia one week ago.  Pt not feeling better. Sent from urgent care for evaluation as new pneumonia.

## 2018-01-12 NOTE — ED Provider Notes (Signed)
[facility] EMERGENCY DEPARTMENT Provider Note   CSN: [id number] Arrival date & time: 01/12/18  1120     History   Chief Complaint Chief Complaint  Patient presents with  . Shortness of Breath    HPI Alan Sanders is a 47 y.o. male with history of asthma, GERD, tobacco abuse who presents with 1 week history of cough, shortness of breath, chest pain, nausea, vomiting, diarrhea, generalized abdominal pain.  Patient was diagnosed with influenza and pneumonia last week at urgent care.  He was seen the next day at [facility] told to stop the Levaquin he was given as it was causing him vomiting and diarrhea.  He reports he was not started on a different antibiotic.  He went back to urgent care today and was told he had another area of pneumonia.  He was sent here for hospital admission.  He reports she has not eaten in a week.  He is able to keep fluids down to some degree, however he feels dehydrated.  HPI  Past Medical History:  Diagnosis Date  . Arthritis    " in my back "  . Asthma   . GERD (gastroesophageal reflux disease)   . Seizures ([place])    " its been along time ,since my last seizure "  . Spleen enlarged   . Thrombocytopenia ([place])   . Tobacco abuse     Patient Active Problem List   Diagnosis Date Noted  . Cough 02/13/2015  . Thrombocytopenia ([place]) 02/13/2015  . Chest pain 02/12/2015  . Asthma 02/12/2015  . Tobacco abuse 02/12/2015  . Nausea vomiting and diarrhea 02/12/2015  . Pain in the chest     Past Surgical History:  Procedure Laterality Date  . HERNIA REPAIR         Home Medications    Prior to Admission medications   Medication Sig Start Date End Date Taking? Authorizing Provider  acetaminophen (TYLENOL) 500 MG tablet Take 1 tablet (500 mg total) by mouth every 6 (six) hours as needed. 01/12/18   Camira Geidel, Bea Graff, PA-C  albuterol (PROVENTIL HFA;VENTOLIN HFA) 108 (90 Base) MCG/ACT inhaler Inhale 2 puffs into the lungs every 6 (six) hours  as needed for wheezing or shortness of breath.    [provider]  benzonatate (TESSALON) 100 MG capsule Take 1 capsule (100 mg total) by mouth every 8 (eight) hours. 01/05/18   Tanna Furry, MD  diphenoxylate-atropine (LOMOTIL) 2.5-0.025 MG tablet Take 1 tablet by mouth 4 (four) times daily as needed for diarrhea or loose stools. 01/05/18   Tanna Furry, MD  guaiFENesin-codeine [facility]) 100-10 MG/5ML syrup Take 10 mLs by mouth every 6 (six) hours as needed for cough. 01/12/18   Jaynee Winters, Bea Graff, PA-C  HYDROcodone-acetaminophen (NORCO) 5-325 MG tablet Take 1 tablet by mouth every 6 (six) hours as needed. Patient not taking: Reported on 01/05/2018 10/23/17   Ocie Cornfield T, PA-C  ibuprofen (ADVIL,MOTRIN) 600 MG tablet Take 1 tablet (600 mg total) by mouth every 6 (six) hours as needed. 01/12/18   Cliffard Hair, Bea Graff, PA-C  levofloxacin (LEVAQUIN) 500 MG tablet Take 500 mg by mouth daily.    [provider]  methocarbamol (ROBAXIN) 500 MG tablet Take 2 tablets (1,000 mg total) by mouth every 8 (eight) hours as needed for muscle spasms. Patient not taking: Reported on 04/27/2017 02/04/17   Julianne Rice, MD  methocarbamol (ROBAXIN) 500 MG tablet Take 2 tablets (1,000 mg total) by mouth 3 (three) times daily as  needed for muscle spasms. Patient not taking: Reported on 01/05/2018 04/27/17   Lajean Saver, MD  ondansetron (ZOFRAN ODT) 4 MG disintegrating tablet 4mg  ODT q8 hours prn nausea/vomit Patient not taking: Reported on 01/05/2018 05/25/17   Randal Buba, April, MD  ondansetron (ZOFRAN ODT) 4 MG disintegrating tablet 4mg  ODT q4 hours prn nausea/vomit Patient not taking: Reported on 01/05/2018 11/10/17   Deno Etienne, DO  ondansetron (ZOFRAN-ODT) 8 MG disintegrating tablet Take 8 mg by mouth every 8 (eight) hours as needed for nausea or vomiting.    [provider]  predniSONE (DELTASONE) 20 MG tablet Take 1 tablet (20 mg total) by mouth 2 (two) times daily with a meal. 01/05/18    Tanna Furry, MD    Family History Family History  Problem Relation Age of Onset  . Diabetes Mother   . Hypertension Mother   . COPD Mother     Social History Social History   Tobacco Use  . Smoking status: Current Every Day Smoker    Packs/day: 0.50    Years: 24.00    Pack years: 12.00    Types: Cigarettes  . Smokeless tobacco: Never Used  Substance Use Topics  . Alcohol use: No  . Drug use: No     Allergies   Patient has no known allergies.   Review of Systems Review of Systems  Constitutional: Negative for chills and fever.  HENT: Negative for facial swelling and sore throat.   Respiratory: Positive for cough and shortness of breath.   Cardiovascular: Positive for chest pain.  Gastrointestinal: Positive for abdominal pain, diarrhea and vomiting. Negative for nausea.  Genitourinary: Negative for dysuria.  Musculoskeletal: Negative for back pain.  Skin: Negative for rash and wound.  Neurological: Positive for headaches.  Psychiatric/Behavioral: The patient is not nervous/anxious.      Physical Exam Updated Vital Signs BP 132/90   Pulse 82   Temp 97.9 F (36.6 C) (Oral)   Resp (!) 21   Ht 6\' 2"  (1.88 m)   Wt 102.1 kg (225 lb)   SpO2 98%   BMI 28.89 kg/m   Physical Exam  Constitutional: He appears well-developed and well-nourished. No distress.  HENT:  Head: Normocephalic and atraumatic.  Mouth/Throat: Mucous membranes are dry. No oropharyngeal exudate.  Eyes: Conjunctivae are normal. Pupils are equal, round, and reactive to light. Right eye exhibits no discharge. Left eye exhibits no discharge. No scleral icterus.  Neck: Normal range of motion. Neck supple. No thyromegaly present.  Cardiovascular: Normal rate, regular rhythm, normal heart sounds and intact distal pulses. Exam reveals no gallop and no friction rub.  No murmur heard. Pulmonary/Chest: Effort normal. No stridor. No respiratory distress. He has decreased breath sounds. He has wheezes.    Abdominal: Soft. Bowel sounds are normal. He exhibits no distension. There is no tenderness. There is no rebound and no guarding.  Musculoskeletal: He exhibits no edema.  Lymphadenopathy:    He has no cervical adenopathy.  Neurological: He is alert. Coordination normal.  Skin: Skin is warm and dry. No rash noted. He is not diaphoretic. No pallor.  Psychiatric: He has a normal mood and affect.  Nursing note and vitals reviewed.    ED Treatments / Results  Labs (all labs ordered are listed, but only abnormal results are displayed) Labs Reviewed  CBC WITH DIFFERENTIAL/PLATELET - Abnormal; Notable for the following components:      Result Value   RBC 5.94 (*)    Hemoglobin 17.5 (*)    MCHC 36.4 (*)  All other components within normal limits  COMPREHENSIVE METABOLIC PANEL - Abnormal; Notable for the following components:   Glucose, Bld 120 (*)    Total Protein 8.2 (*)    AST 55 (*)    ALT 72 (*)    All other components within normal limits  LIPASE, BLOOD  TROPONIN I    EKG  EKG Interpretation None       Radiology Dg Chest 2 View  Result Date: 01/12/2018 CLINICAL DATA:  Recent diagnosis of pneumonia.  Cough and fever. EXAM: CHEST  2 VIEW COMPARISON:  01/05/2018. FINDINGS: Mediastinum hilar structures normal. Low lung volumes with mild bibasilar atelectasis. No pleural effusion or pneumothorax. Heart size normal. No acute bony abnormality. IMPRESSION: Low lung volumes with mild bibasilar atelectasis. Electronically Signed   By: Marcello Moores  Register   On: 01/12/2018 12:26    Procedures Procedures (including critical care time)  Medications Ordered in ED Medications  sodium chloride 0.9 % bolus 1,000 mL (0 mLs Intravenous Stopped 01/12/18 1400)  ipratropium-albuterol (DUONEB) 0.5-2.5 (3) MG/3ML nebulizer solution 3 mL (3 mLs Nebulization Given 01/12/18 1255)  ketorolac (TORADOL) 30 MG/ML injection 30 mg (30 mg Intravenous Given 01/12/18 1245)  metoCLOPramide (REGLAN) injection  10 mg (10 mg Intravenous Given 01/12/18 1245)  sodium chloride 0.9 % bolus 1,000 mL (0 mLs Intravenous Stopped 01/12/18 1550)  acetaminophen (TYLENOL) tablet 1,000 mg (1,000 mg Oral Given 01/12/18 1402)     Initial Impression / Assessment and Plan / ED Course  I have reviewed the triage vital signs and the nursing notes.  Pertinent labs & imaging results that were available during my care of the patient were reviewed by me and considered in my medical decision making (see chart for details).     Patient with history of positive influenza.  Patient has persistent cough, shortness of breath, nausea, vomiting, diarrhea.  Patient does have mild dehydration.  Given 2 L of fluids in the ED.  Patient has persistent elevation of LFTs from December 2018.  No localized right upper quadrant tenderness.  AST 55, ALT 72.  Advised patient to follow-up and establish care with a PCP for further evaluation of this.  Chest x-ray shows no pneumonia, but low lung volumes with mild bibasilar atelectasis.  EKG shows NSR.  After fluids, DuoNeb, Toradol, Reglan, Tylenol, patient is feeling some improvement.  No indication for admission at this time.  Patient discharged home with refill of cough medication, ibuprofen, Tylenol.  Advised rest and hydration.  Patient is tolerating PO fluids prior to discharge.  Return precautions discussed.  Patient understands and agrees with plan.  Patient vitals stable and discharged in satisfactory condition.  Patient also evaluated by Dr. Ashok Cordia who guided the patient's management and agrees with plan.  Final Clinical Impressions(s) / ED Diagnoses   Final diagnoses:  Influenza  Shortness of breath    ED Discharge Orders        Ordered    guaiFENesin-codeine [facility] AC) 100-10 MG/5ML syrup  Every 6 hours PRN     01/12/18 1532    ibuprofen (ADVIL,MOTRIN) 600 MG tablet  Every 6 hours PRN     01/12/18 1532    acetaminophen (TYLENOL) 500 MG tablet  Every 6 hours PRN     01/12/18  1532       LawBea Graff, PA-C 01/13/18 1110    Lajean Saver, MD 01/13/18 1220

## 2018-01-12 NOTE — Discharge Instructions (Signed)
Take cough medication as prescribed every 6 hours as needed for cough.  Take Tylenol or ibuprofen every 6 hours or alternate every 3 hours.  Please return to the emergency department if you develop any new or worsening symptoms.  Make sure to drink plenty of fluids and get plenty of rest.

## 2018-02-09 ENCOUNTER — Encounter (HOSPITAL_BASED_OUTPATIENT_CLINIC_OR_DEPARTMENT_OTHER): Payer: Self-pay

## 2018-02-09 ENCOUNTER — Emergency Department (HOSPITAL_BASED_OUTPATIENT_CLINIC_OR_DEPARTMENT_OTHER): Payer: Managed Care, Other (non HMO)

## 2018-02-09 DIAGNOSIS — F17210 Nicotine dependence, cigarettes, uncomplicated: Secondary | ICD-10-CM | POA: Insufficient documentation

## 2018-02-09 DIAGNOSIS — X58XXXA Exposure to other specified factors, initial encounter: Secondary | ICD-10-CM | POA: Insufficient documentation

## 2018-02-09 DIAGNOSIS — S92425A Nondisplaced fracture of distal phalanx of left great toe, initial encounter for closed fracture: Secondary | ICD-10-CM | POA: Insufficient documentation

## 2018-02-09 DIAGNOSIS — Z23 Encounter for immunization: Secondary | ICD-10-CM | POA: Insufficient documentation

## 2018-02-09 DIAGNOSIS — Y929 Unspecified place or not applicable: Secondary | ICD-10-CM | POA: Insufficient documentation

## 2018-02-09 DIAGNOSIS — Y999 Unspecified external cause status: Secondary | ICD-10-CM | POA: Insufficient documentation

## 2018-02-09 DIAGNOSIS — J45909 Unspecified asthma, uncomplicated: Secondary | ICD-10-CM | POA: Insufficient documentation

## 2018-02-09 DIAGNOSIS — Y939 Activity, unspecified: Secondary | ICD-10-CM | POA: Insufficient documentation

## 2018-02-09 IMAGING — DX DG TOE GREAT 2+V*L*
3 series · 3 of 3 positions shown · non-contrast
Comparison: None.

CLINICAL DATA: Great toe pain after trauma.

EXAM:
LEFT GREAT TOE

[toe ap]
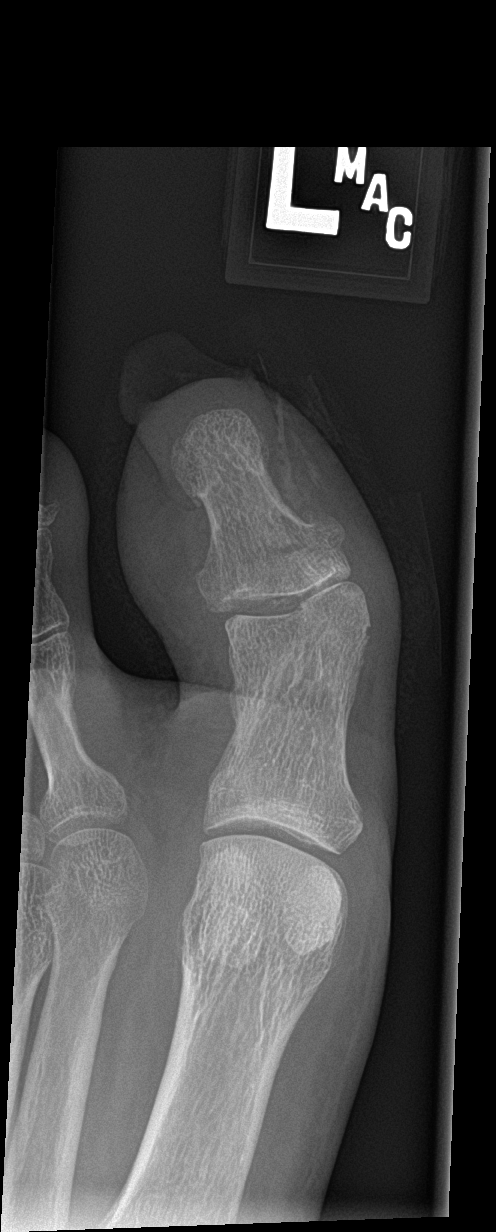

[toe obl]
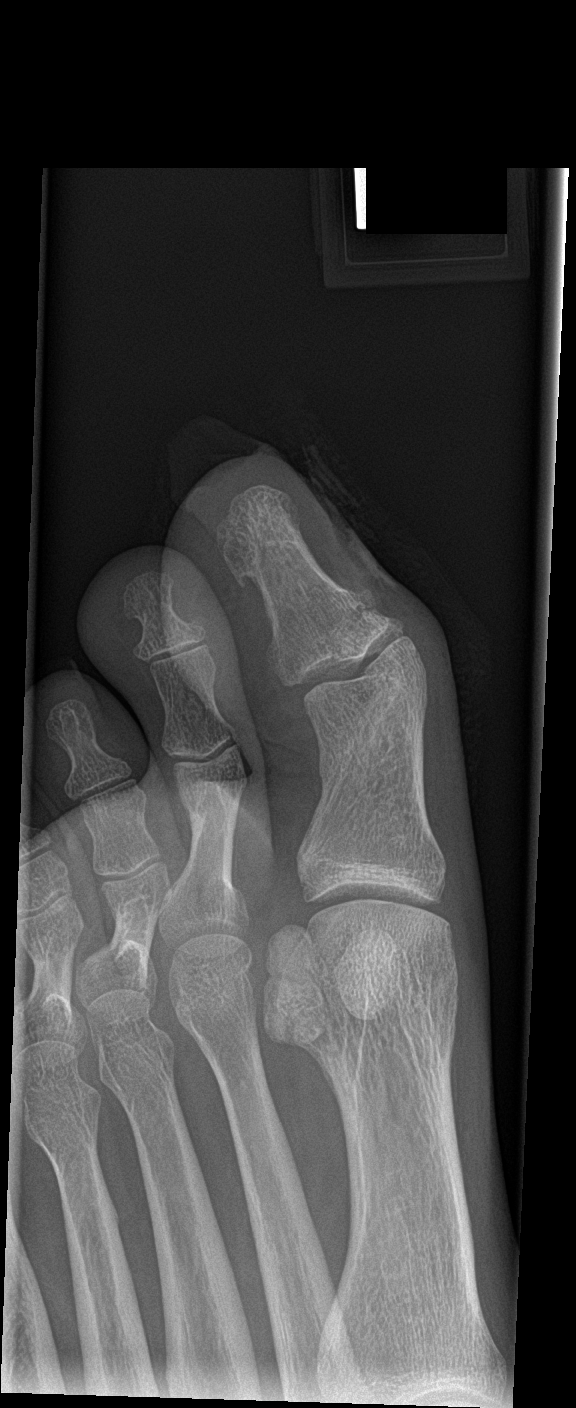

[toe lat]
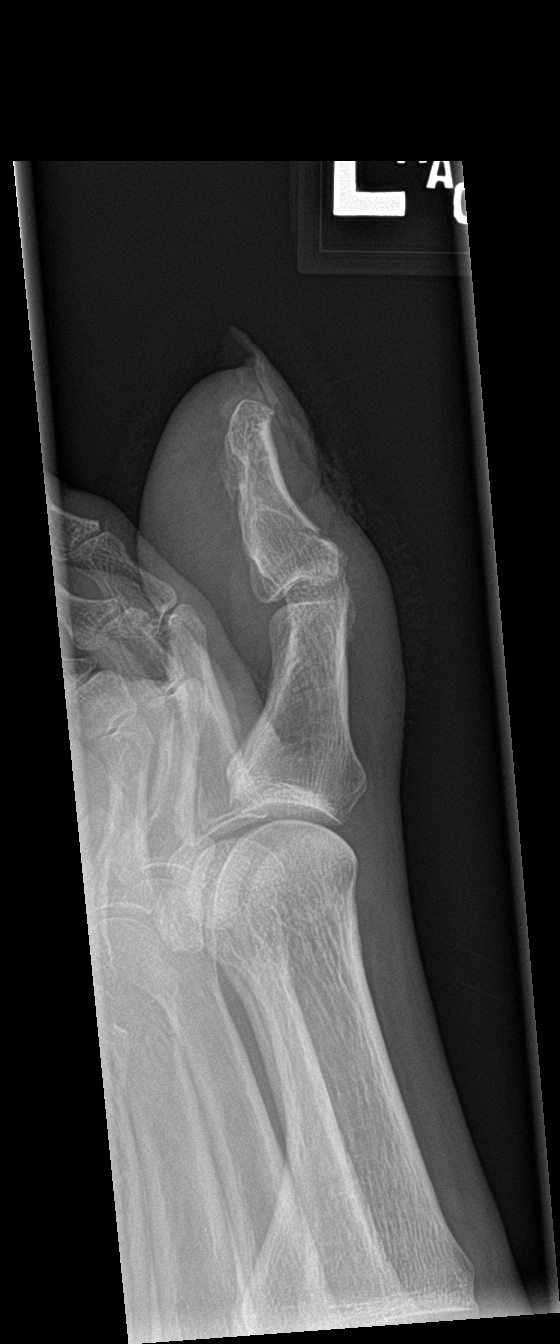

[3 of 3 positions shown; findings below may reference images not displayed]

FINDINGS: Acute incomplete fracture involving the base of the left first
distal phalanx. No joint dislocation. There is mild soft tissue
swelling. Partially avulsed toenail.
IMPRESSION: 1. Incomplete fracture involving the base of the left first distal
phalanx. No definite intra-articular extension of fracture is seen.
Associated soft tissue swelling is identified
2. No joint dislocation is noted.

## 2018-02-09 MED ORDER — OXYCODONE-ACETAMINOPHEN 5-325 MG PO TABS
1.0000 | ORAL_TABLET | ORAL | Status: AC | PRN
  Administered 2018-02-09 – 2018-02-10 (×2): 1 via ORAL
  Filled 2018-02-09 (×2): qty 1

## 2018-02-09 NOTE — ED Triage Notes (Signed)
Pt injured left great toe-states he stepped on toe approx 1-1.5 hours PTA-partial toe nail avulsion-?lac-clotted blood along toe-pt presents to triage in w/c

## 2018-02-10 ENCOUNTER — Emergency Department (HOSPITAL_BASED_OUTPATIENT_CLINIC_OR_DEPARTMENT_OTHER)
Admission: EM | Admit: 2018-02-10 | Discharge: 2018-02-10 | Disposition: A | Discharge status: Home / Self Care | Payer: Managed Care, Other (non HMO) | Attending: Emergency Medicine | Admitting: Emergency Medicine

## 2018-02-10 DIAGNOSIS — S91209A Unspecified open wound of unspecified toe(s) with damage to nail, initial encounter: Secondary | ICD-10-CM

## 2018-02-10 DIAGNOSIS — S92425A Nondisplaced fracture of distal phalanx of left great toe, initial encounter for closed fracture: Secondary | ICD-10-CM

## 2018-02-10 MED ORDER — CEFAZOLIN SODIUM-DEXTROSE 1-4 GM/50ML-% IV SOLN
1.0000 g | Freq: Once | INTRAVENOUS | Status: AC
  Administered 2018-02-10: 1 g via INTRAVENOUS
  Filled 2018-02-10: qty 50

## 2018-02-10 MED ORDER — HYDROCODONE-ACETAMINOPHEN 5-325 MG PO TABS: 1.0000 | ORAL_TABLET | ORAL | 0 refills | Status: DC | PRN

## 2018-02-10 MED ORDER — FENTANYL CITRATE (PF) 100 MCG/2ML IJ SOLN
100.0000 ug | Freq: Once | INTRAMUSCULAR | Status: AC
  Administered 2018-02-10: 100 ug via INTRAVENOUS
  Filled 2018-02-10: qty 2

## 2018-02-10 MED ORDER — TETANUS-DIPHTH-ACELL PERTUSSIS 5-2.5-18.5 LF-MCG/0.5 IM SUSP
0.5000 mL | Freq: Once | INTRAMUSCULAR | Status: AC
  Administered 2018-02-10: 0.5 mL via INTRAMUSCULAR
  Filled 2018-02-10: qty 0.5

## 2018-02-10 MED ORDER — CEPHALEXIN 500 MG PO CAPS: 500.0000 mg | ORAL_CAPSULE | Freq: Four times a day (QID) | ORAL | 0 refills | Status: DC

## 2018-02-10 MED ORDER — ONDANSETRON HCL 4 MG/2ML IJ SOLN
4.0000 mg | Freq: Once | INTRAMUSCULAR | Status: AC
  Administered 2018-02-10: 4 mg via INTRAVENOUS
  Filled 2018-02-10: qty 2

## 2018-02-10 NOTE — ED Provider Notes (Addendum)
[facility] Provider Note: Alan Spurling, MD, FACEP  CSN: [id number] MRN: [id number] ARRIVAL: 02/09/18 at 2136 ROOM: [place]  Toe Injury   HISTORY OF PRESENT ILLNESS  02/10/18 2:24 AM Alan Sanders is a 47 y.o. male who hyperflexed his left great toe about 1/2 hours prior to arrival yesterday evening.  He has bleeding and ecchymosis near the nail fold with pain in the toe radiating proximally.  He rates his pain as an 8 out of 10, worse with palpation or attempted movement.  Movement of the toe is limited due to pain and swelling.  He is also having a decreased sensation distally.  He is not sure of his last tetanus shot.  He denies other injury.  Consultation with the [facility] state controlled substances database reveals the patient has received 1 prescription for narcotic pain medication in the last year.   Past Medical History:  Diagnosis Date  . Arthritis    " in my back "  . Asthma   . GERD (gastroesophageal reflux disease)   . Seizures ([place])    " its been along time ,since my last seizure "  . Spleen enlarged   . Thrombocytopenia ([place])   . Tobacco abuse     Past Surgical History:  Procedure Laterality Date  . HERNIA REPAIR      Family History  Problem Relation Age of Onset  . Diabetes Mother   . Hypertension Mother   . COPD Mother     Social History   Tobacco Use  . Smoking status: Current Every Day Smoker    Packs/day: 0.50    Years: 24.00    Pack years: 12.00    Types: Cigarettes  . Smokeless tobacco: Never Used  Substance Use Topics  . Alcohol use: No  . Drug use: No    Prior to Admission medications   Medication Sig Start Date End Date Taking? Authorizing Provider  acetaminophen (TYLENOL) 500 MG tablet Take 1 tablet (500 mg total) by mouth every 6 (six) hours as needed. 01/12/18   Law, Bea Graff, PA-C  albuterol (PROVENTIL HFA;VENTOLIN HFA) 108 (90 Base) MCG/ACT inhaler Inhale 2 puffs into the lungs  every 6 (six) hours as needed for wheezing or shortness of breath.    [provider]  benzonatate (TESSALON) 100 MG capsule Take 1 capsule (100 mg total) by mouth every 8 (eight) hours. 01/05/18   Tanna Furry, MD  diphenoxylate-atropine (LOMOTIL) 2.5-0.025 MG tablet Take 1 tablet by mouth 4 (four) times daily as needed for diarrhea or loose stools. 01/05/18   Tanna Furry, MD  guaiFENesin-codeine [facility]) 100-10 MG/5ML syrup Take 10 mLs by mouth every 6 (six) hours as needed for cough. 01/12/18   Law, Bea Graff, PA-C  ibuprofen (ADVIL,MOTRIN) 600 MG tablet Take 1 tablet (600 mg total) by mouth every 6 (six) hours as needed. 01/12/18   Frederica Kuster, PA-C    Allergies Patient has no known allergies.   REVIEW OF SYSTEMS  Negative except as noted here or in the History of Present Illness.   PHYSICAL EXAMINATION  Initial Vital Signs Blood pressure 125/79, pulse 92, temperature 98.9 F (37.2 C), temperature source Oral, resp. rate 18, height 6\' 1"  (1.854 m), weight 102.1 kg (225 lb), SpO2 96 %.  Examination General: Well-developed, well-nourished male in no acute distress; appearance consistent with age of record HENT: normocephalic; atraumatic Eyes: Normal appearance Neck: supple Heart: regular rate and rhythm Lungs: clear to  auscultation bilaterally Abdomen: soft; nondistended; nontender; bowel sounds present Extremities: No deformity; full range of motion except left great toe; pulses normal; partial avulsion of left great toenail with superficial laceration and ecchymosis near the nail fold, tenderness and decreased range of motion of left great toe with decreased sensation distally but brisk capillary refill:  Neurologic: Awake, alert and oriented; motor function intact in all extremities and symmetric; no facial droop Skin: Warm and dry Psychiatric: Normal mood and affect   RESULTS  Summary of this visit's results, reviewed by myself:   EKG  Interpretation  Date/Time:    Ventricular Rate:    PR Interval:    QRS Duration:   QT Interval:    QTC Calculation:   R Axis:     Text Interpretation:        Laboratory Studies: No results found for this or any previous visit (from the past 24 hour(s)). Imaging Studies: Dg Toe Great Left  Result Date: 02/09/2018 CLINICAL DATA:  Great toe pain after trauma. EXAM: LEFT GREAT TOE COMPARISON:  None. FINDINGS: Acute incomplete fracture involving the base of the left first distal phalanx. No joint dislocation. There is mild soft tissue swelling. Partially avulsed toenail. IMPRESSION: 1. Incomplete fracture involving the base of the left first distal phalanx. No definite intra-articular extension of fracture is seen. Associated soft tissue swelling is identified 2. No joint dislocation is noted. Electronically Signed   By: Alan Sanders M.D.   On: 02/09/2018 22:23    ED COURSE  Nursing notes and initial vitals signs, including pulse oximetry, reviewed.  Vitals:   02/09/18 2155 02/09/18 2156 02/10/18 0103  BP:  124/89 125/79  Pulse:  (!) 108 92  Resp:  20 18  Temp:  98.9 F (37.2 C)   TempSrc:  Oral   SpO2:  96% 96%  Weight: 102.1 kg (225 lb)    Height: 6\' 1"  (1.854 m)     Patient advised he may lose the current toenail but will likely grow a new one to replace it.  I do not believe the nail is sufficiently avulsed to indicate replantation.  He was given Ancef 1 g IV for infection prophylaxis as well as a Tdap.  Distal aspect of nail trimmed back to help prevent further avulsion.   PROCEDURES    ED DIAGNOSES     ICD-10-CM   1. Closed nondisplaced fracture of distal phalanx of left great toe, initial encounter S92.425A   2. Avulsion of toenail of left foot S91.209A        Alan Sanders, Alan Reichmann, MD 02/10/18 0310    Alan Rosser, MD 02/10/18 [phone]

## 2018-02-10 NOTE — ED Notes (Signed)
Xerform wrapped in gauze applied to wound area.

## 2018-05-10 ENCOUNTER — Other Ambulatory Visit: Payer: Self-pay

## 2018-05-10 ENCOUNTER — Emergency Department (HOSPITAL_COMMUNITY)
Admission: EM | Admit: 2018-05-10 | Discharge: 2018-05-10 | Disposition: A | Discharge status: Home / Self Care | Payer: Self-pay | Attending: Emergency Medicine | Admitting: Emergency Medicine

## 2018-05-10 ENCOUNTER — Encounter (HOSPITAL_COMMUNITY): Payer: Self-pay | Admitting: *Deleted

## 2018-05-10 ENCOUNTER — Emergency Department (HOSPITAL_COMMUNITY): Payer: Self-pay

## 2018-05-10 DIAGNOSIS — R1031 Right lower quadrant pain: Secondary | ICD-10-CM | POA: Insufficient documentation

## 2018-05-10 DIAGNOSIS — Z79899 Other long term (current) drug therapy: Secondary | ICD-10-CM | POA: Insufficient documentation

## 2018-05-10 DIAGNOSIS — F17210 Nicotine dependence, cigarettes, uncomplicated: Secondary | ICD-10-CM | POA: Insufficient documentation

## 2018-05-10 DIAGNOSIS — J45909 Unspecified asthma, uncomplicated: Secondary | ICD-10-CM | POA: Insufficient documentation

## 2018-05-10 DIAGNOSIS — R51 Headache: Secondary | ICD-10-CM | POA: Insufficient documentation

## 2018-05-10 DIAGNOSIS — R1110 Vomiting, unspecified: Secondary | ICD-10-CM

## 2018-05-10 DIAGNOSIS — F3289 Other specified depressive episodes: Secondary | ICD-10-CM | POA: Insufficient documentation

## 2018-05-10 LAB — CBC WITH DIFFERENTIAL/PLATELET
Basophils Absolute: 0 10*3/uL (ref 0.0–0.1)
Basophils Relative: 0 %
EOS ABS: 0.2 10*3/uL (ref 0.0–0.7)
Eosinophils Relative: 2 %
HCT: 48 % (ref 39.0–52.0)
HEMOGLOBIN: 17.1 g/dL — AB (ref 13.0–17.0)
LYMPHS ABS: 1.1 10*3/uL (ref 0.7–4.0)
Lymphocytes Relative: 17 %
MCH: 29.5 pg (ref 26.0–34.0)
MCHC: 35.6 g/dL (ref 30.0–36.0)
MCV: 82.9 fL (ref 78.0–100.0)
Monocytes Absolute: 0.4 10*3/uL (ref 0.1–1.0)
Monocytes Relative: 5 %
NEUTROS PCT: 76 %
Neutro Abs: 4.9 10*3/uL (ref 1.7–7.7)
Platelets: 129 10*3/uL — ABNORMAL LOW (ref 150–400)
RBC: 5.79 MIL/uL (ref 4.22–5.81)
RDW: 13.8 % (ref 11.5–15.5)
WBC: 6.5 10*3/uL (ref 4.0–10.5)

## 2018-05-10 LAB — URINALYSIS, ROUTINE W REFLEX MICROSCOPIC
BACTERIA UA: NONE SEEN
Bilirubin Urine: NEGATIVE
Glucose, UA: NEGATIVE mg/dL
Hgb urine dipstick: NEGATIVE
Ketones, ur: NEGATIVE mg/dL
Nitrite: NEGATIVE
PROTEIN: NEGATIVE mg/dL
SPECIFIC GRAVITY, URINE: 1.016 (ref 1.005–1.030)
pH: 5 (ref 5.0–8.0)

## 2018-05-10 LAB — COMPREHENSIVE METABOLIC PANEL
ALBUMIN: 5.1 g/dL — AB (ref 3.5–5.0)
ALK PHOS: 79 U/L (ref 38–126)
ALT: 76 U/L — AB (ref 17–63)
AST: 52 U/L — AB (ref 15–41)
Anion gap: 8 (ref 5–15)
BUN: 14 mg/dL (ref 6–20)
CALCIUM: 9.6 mg/dL (ref 8.9–10.3)
CO2: 27 mmol/L (ref 22–32)
CREATININE: 0.75 mg/dL (ref 0.61–1.24)
Chloride: 105 mmol/L (ref 101–111)
GFR calc non Af Amer: >60 mL/min (ref >60)
GLUCOSE: 113 mg/dL — AB (ref 65–99)
Potassium: 4.1 mmol/L (ref 3.5–5.1)
SODIUM: 140 mmol/L (ref 135–145)
Total Bilirubin: 1 mg/dL (ref 0.3–1.2)
Total Protein: 8 g/dL (ref 6.5–8.1)

## 2018-05-10 LAB — TROPONIN I

## 2018-05-10 LAB — LIPASE, BLOOD: Lipase: 33 U/L (ref 11–51)

## 2018-05-10 IMAGING — CT CT HEAD W/O CM
3 series · 15 of 47 positions shown, 18 images · non-contrast
Comparison: [DATE] head CT

CLINICAL DATA: 47-year-old male with acute headache today.

EXAM:
CT HEAD WITHOUT CONTRAST
TECHNIQUE: Contiguous axial images were obtained from the base of the skull
through the vertex without intravenous contrast.

[Series 2: head wo · axial · 0.47mm/px · z∈[-162,-37]mm · 9 of 31 slices shown, 12 images]
[im 3/31  brain]
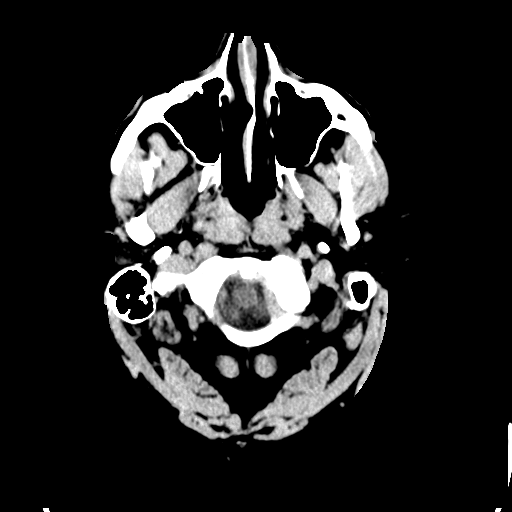
[im 3/31  bone]
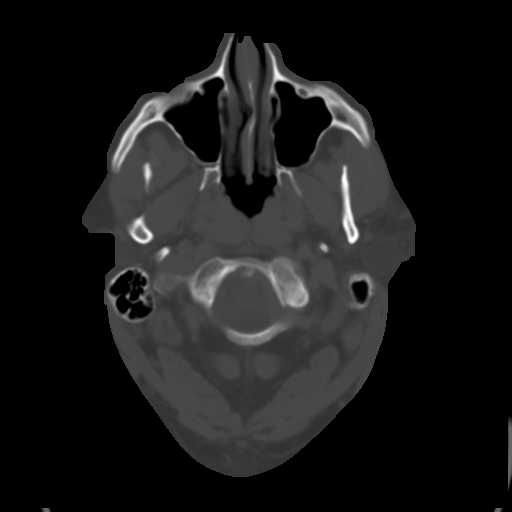
[im 6/31  brain]
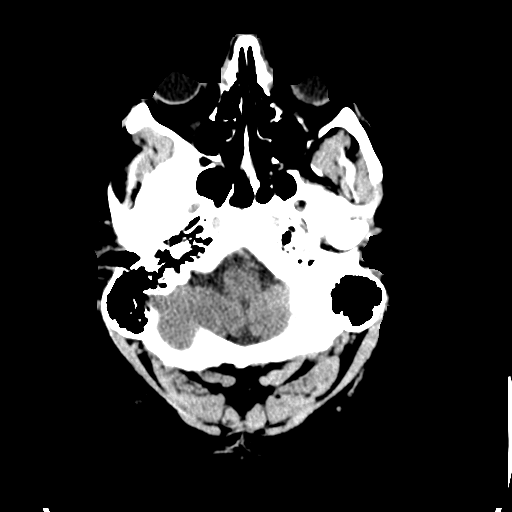
[im 9/31  brain]
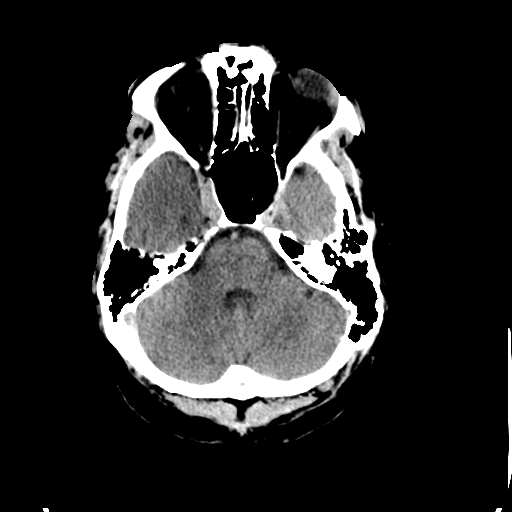
[im 12/31  brain]
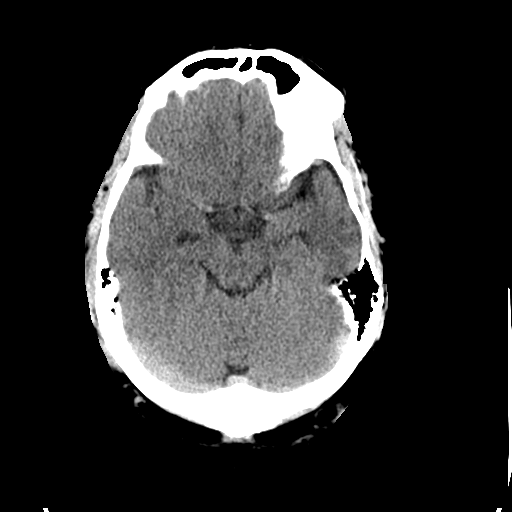
[im 16/31  brain]
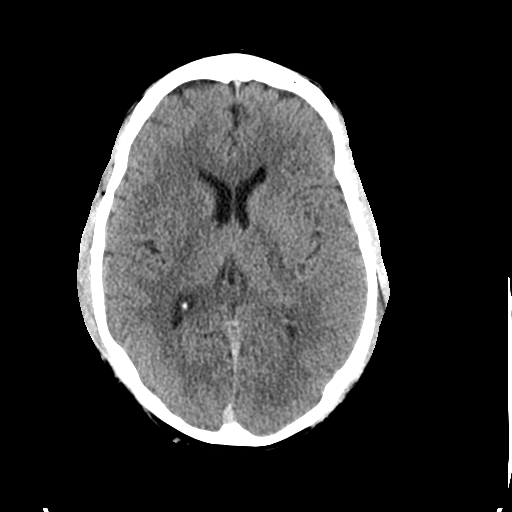
[im 16/31  bone]
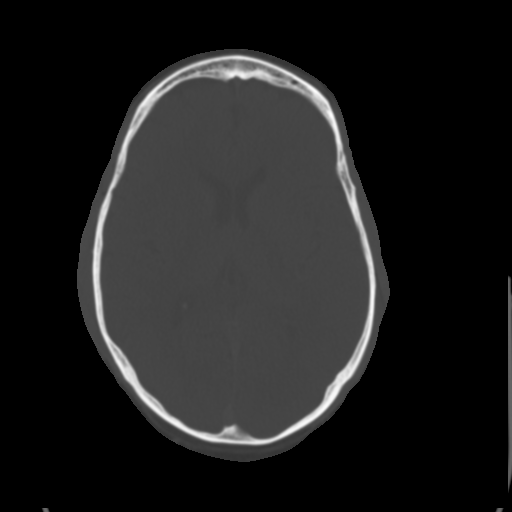
[im 19/31  brain]
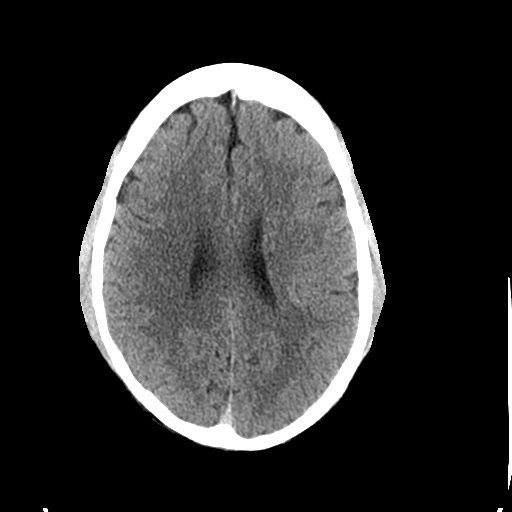
[im 22/31  brain]
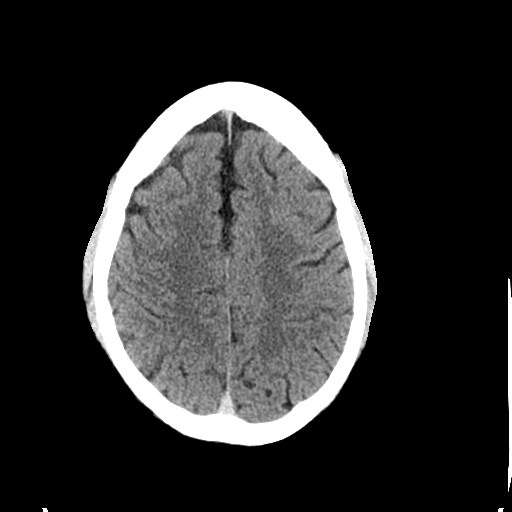
[im 25/31  brain]
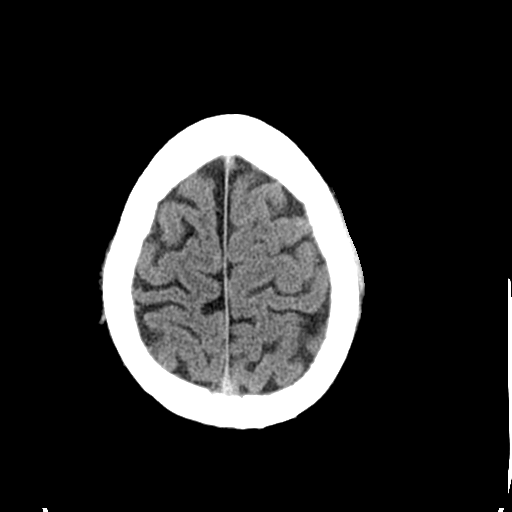
[im 28/31  brain]
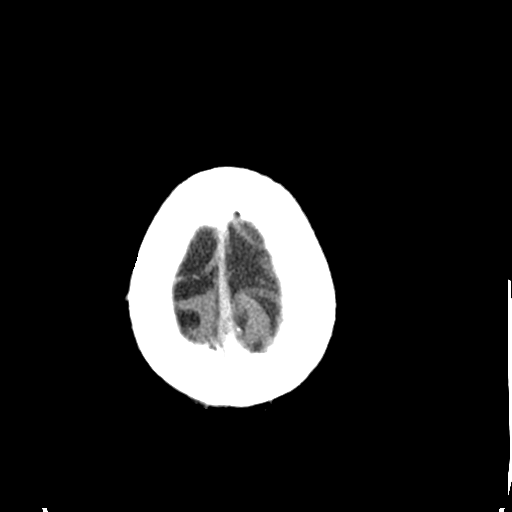
[im 28/31  bone]
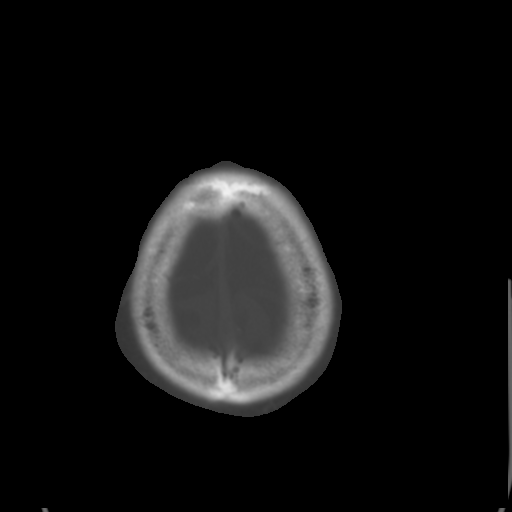

[Series 4: coronal soft tissue · coronal · 0.30mm/px · 3 of 65 slices shown]
[im 22/65  brain]
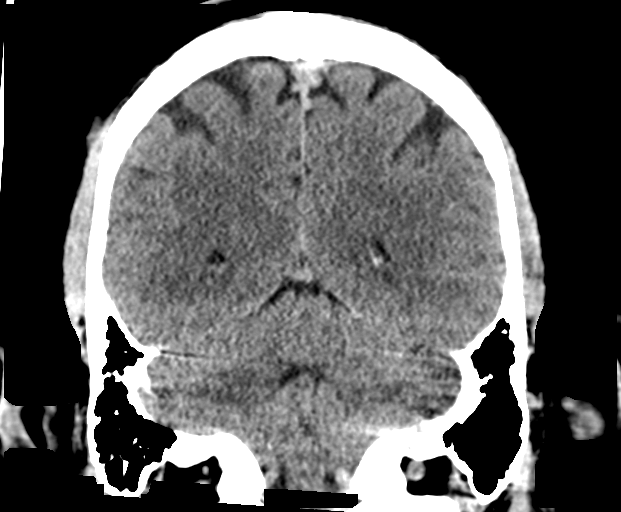
[im 29/65  brain]
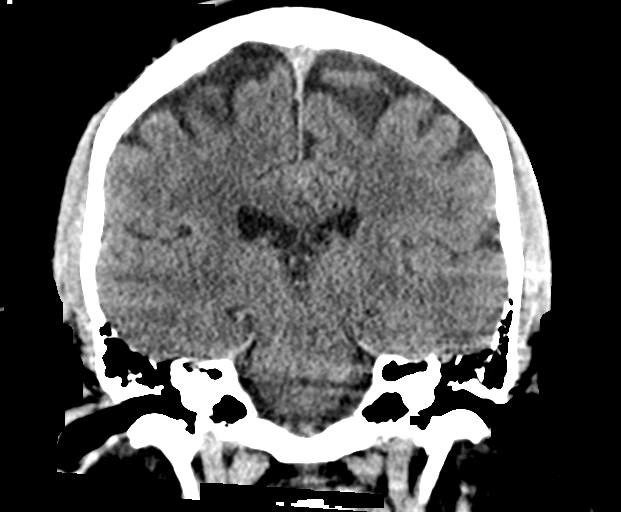
[im 36/65  brain]
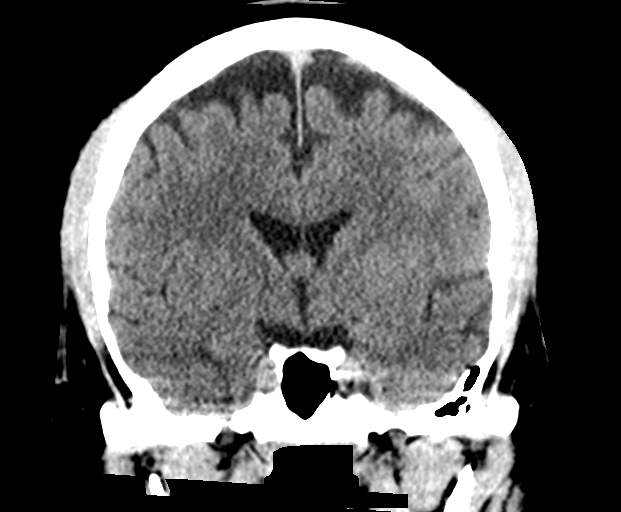

[Series 5: sagittal soft tissue · sagittal · 0.30mm/px · 3 of 51 slices shown]
[im 17/51  brain]
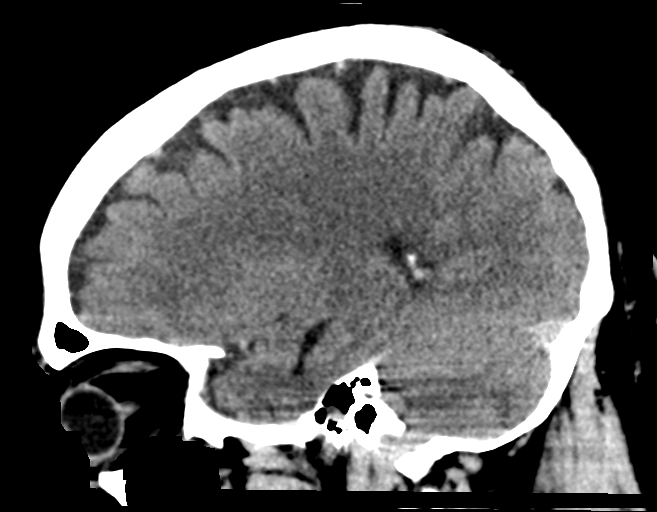
[im 26/51  brain]
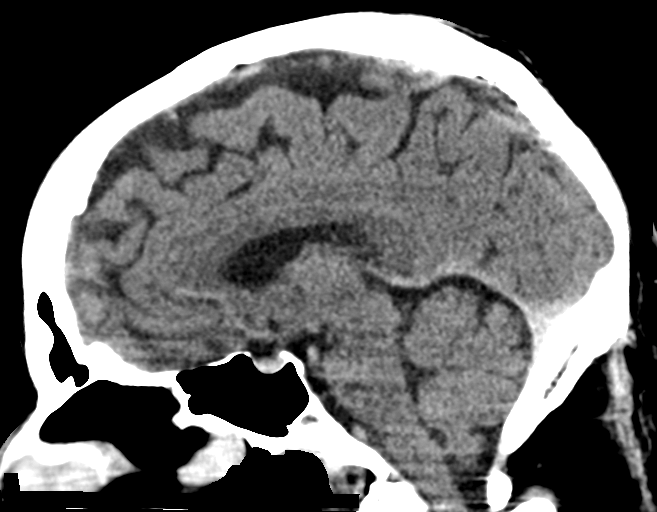
[im 34/51  brain]
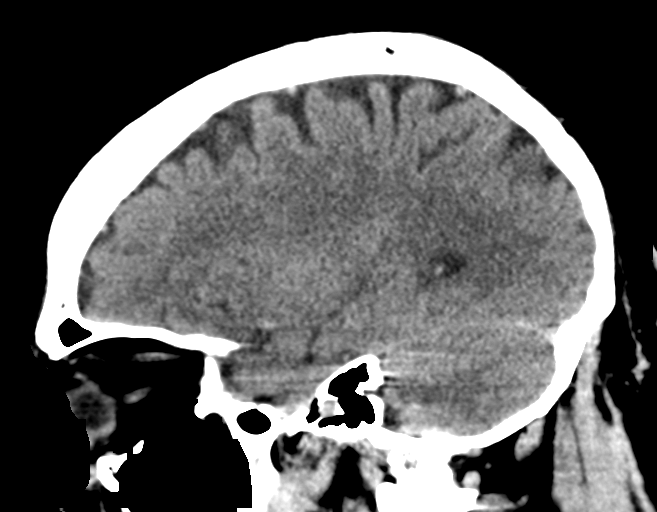

[15 of 47 positions shown; findings below may reference images not displayed]

FINDINGS: Brain: No evidence of acute infarction, hemorrhage, hydrocephalus,
extra-axial collection or mass lesion/mass effect.

Vascular: No hyperdense vessel or unexpected calcification.

Skull: Normal. Negative for fracture or focal lesion.

Sinuses/Orbits: No acute finding.

Other: None.
IMPRESSION: Unremarkable noncontrast head CT.

## 2018-05-10 IMAGING — CT CT ABD-PELV W/ CM
2 of 5 series · 15 of 46 positions shown, 17 images · IV contrast (ISOVUE)
Comparison: CT [DATE] and [DATE].

CLINICAL DATA: Severe upper abdominal pain and vomiting today.
History of gastroesophageal reflux disease, seizures, splenomegaly
and thrombocytopenia.

EXAM:
CT ABDOMEN AND PELVIS WITH CONTRAST
TECHNIQUE: Multidetector CT imaging of the abdomen and pelvis was performed
using the standard protocol following bolus administration of
intravenous contrast.
CONTRAST:  100mL [OD] IOPAMIDOL ([OD]) INJECTION 61%

[Series 2: axial st · axial · 0.98mm/px · z∈[+928,+1448]mm · 12 of 120 slices shown, 14 images]
[im 8/120  soft-tissue]
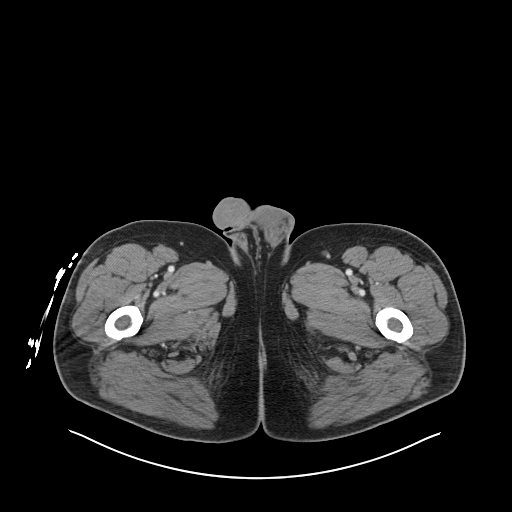
[im 8/120  bone]
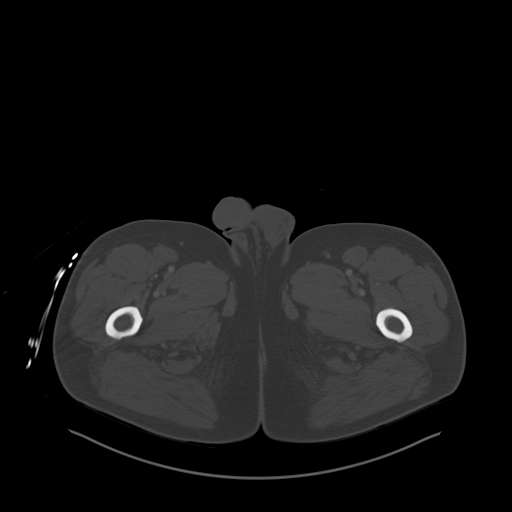
[im 15/120  soft-tissue]
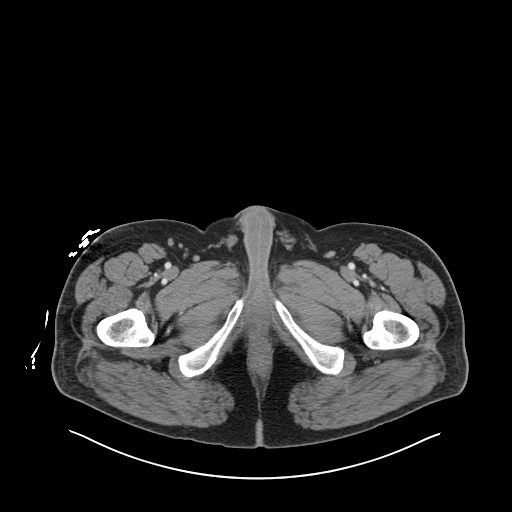
[im 30/120  soft-tissue]
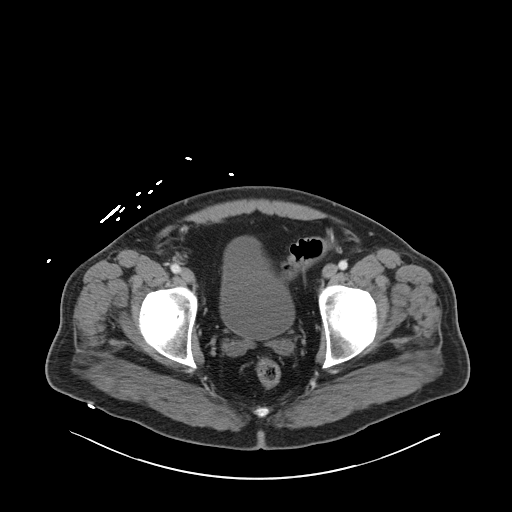
[im 38/120  soft-tissue]
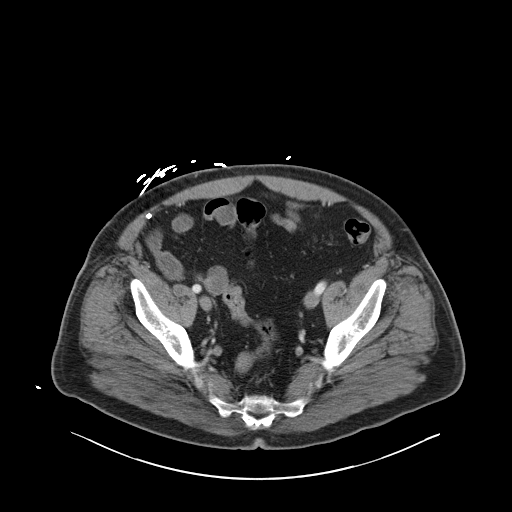
[im 45/120  soft-tissue]
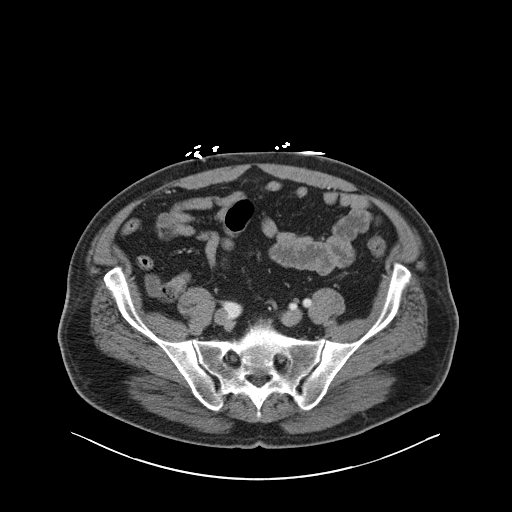
[im 53/120  soft-tissue]
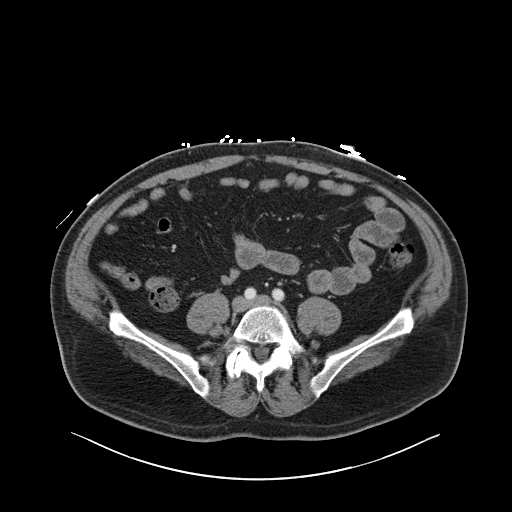
[im 67/120  soft-tissue]
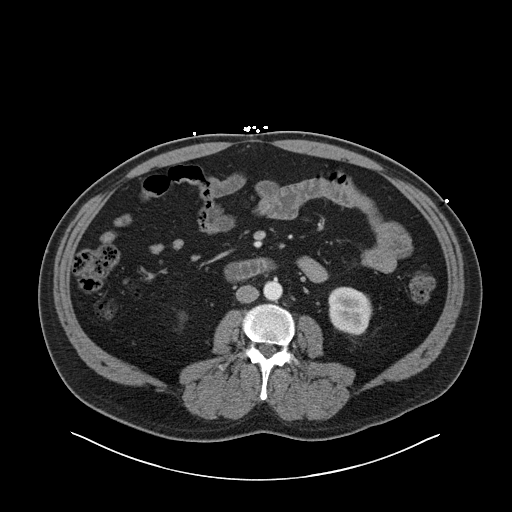
[im 75/120  soft-tissue]
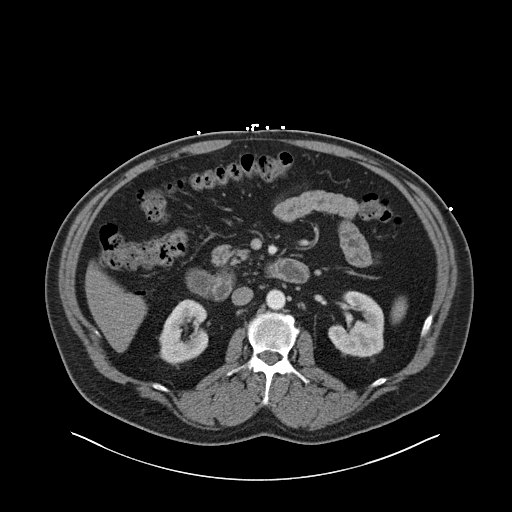
[im 82/120  soft-tissue]
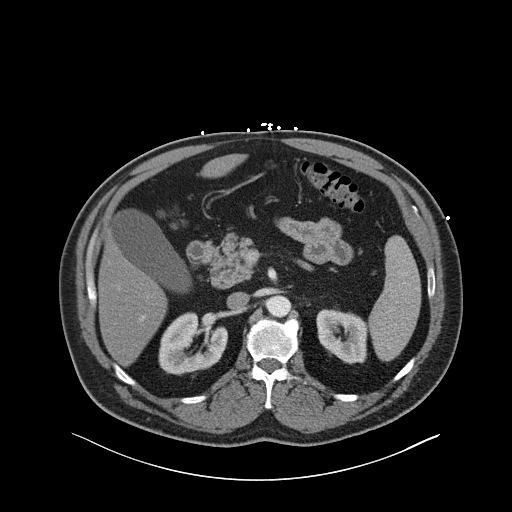
[im 82/120  bone]
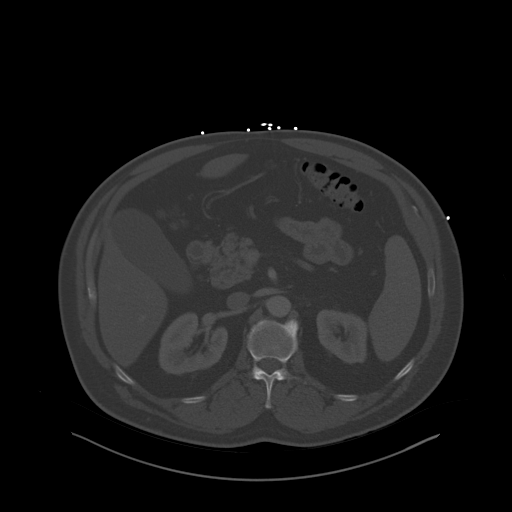
[im 90/120  soft-tissue]
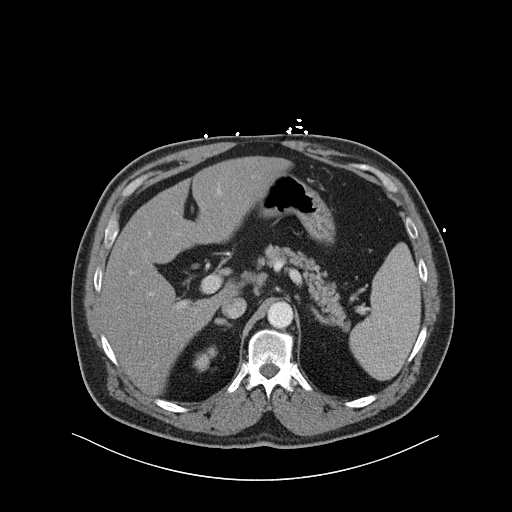
[im 105/120  soft-tissue]
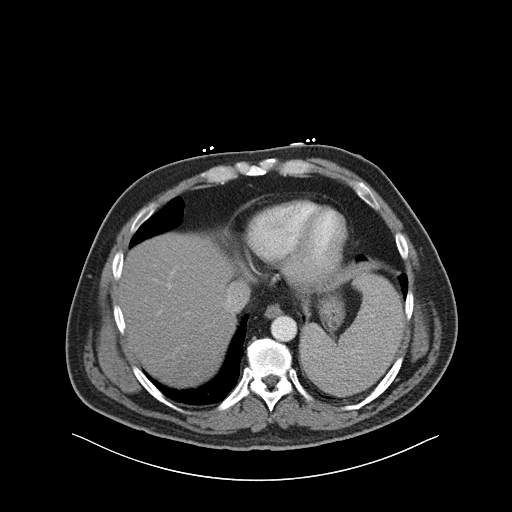
[im 112/120  soft-tissue]
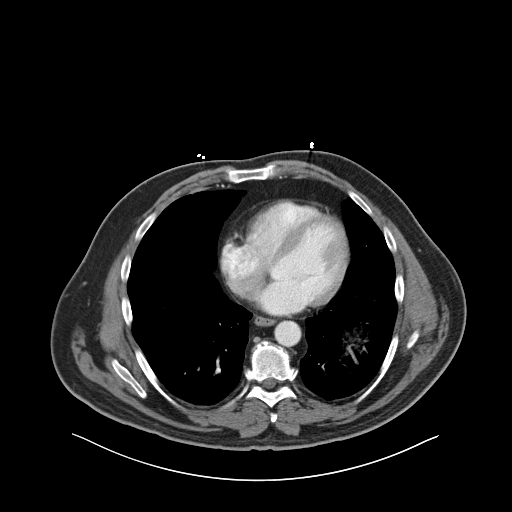

[Series 5: coronal st · coronal · 0.86mm/px · 3 of 107 slices shown]
[im 36/107  soft-tissue]
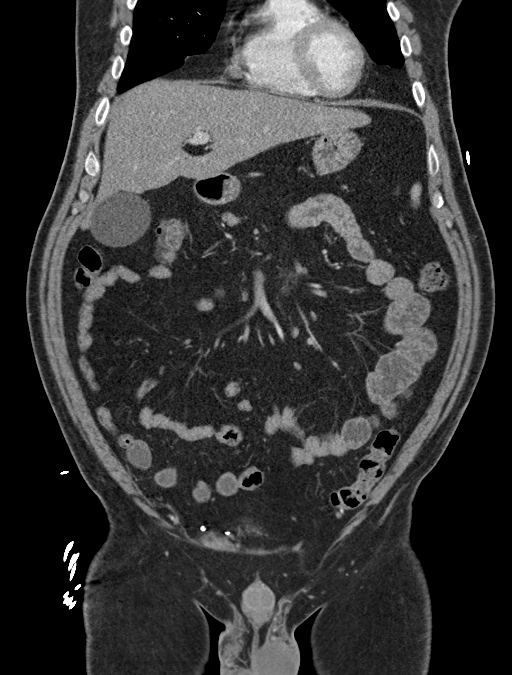
[im 48/107  soft-tissue]
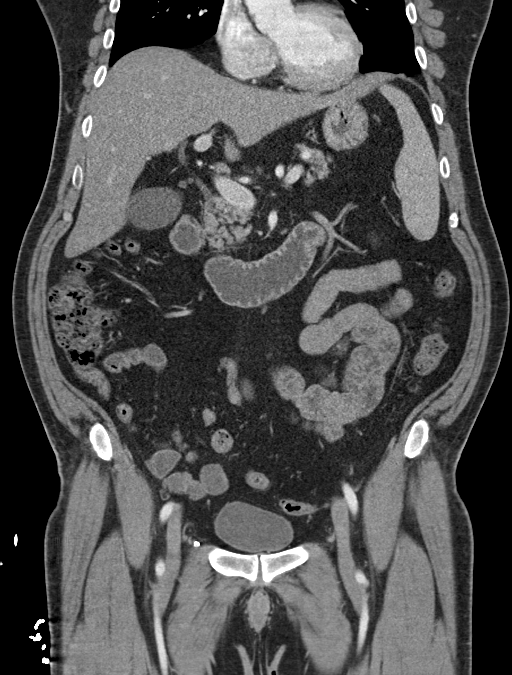
[im 59/107  soft-tissue]
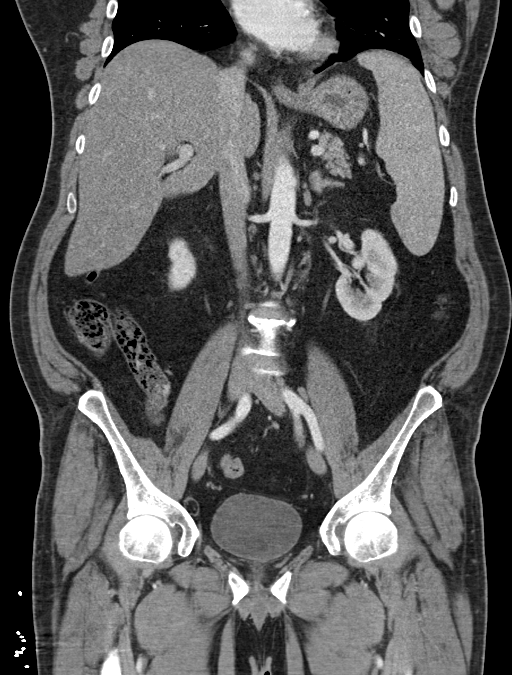

[15 of 46 positions shown; findings below may reference images not displayed]

FINDINGS: Lower chest: Mild dependent atelectasis at both lung bases. No
significant pleural or pericardial effusion.

Hepatobiliary: Subjective mild hepatic steatosis. No focal lesion or
abnormal enhancement. No evidence of gallstones, gallbladder wall
thickening or biliary dilatation.

Pancreas: Unremarkable. No pancreatic ductal dilatation or
surrounding inflammatory changes.

Spleen: Measures 13.6 x 5.4 x 17.9 cm (volume = 690 cm^3)
consistent with mild splenomegaly, stable. No focal lesion.

Adrenals/Urinary Tract: Both adrenal glands appear normal. The
kidneys appear normal without evidence of urinary tract calculus,
suspicious lesion or hydronephrosis. No bladder abnormalities are
seen.

Stomach/Bowel: No evidence of bowel wall thickening, distention or
surrounding inflammatory change. The appendix appears normal. There
is fecalization of the terminal ileum without associated wall
thickening or surrounding inflammation.

Vascular/Lymphatic: There are no enlarged abdominal or pelvic lymph
nodes. Mildly prominent lymph nodes in the porta hepatis are stable.
No significant vascular findings.

Reproductive: The prostate gland and seminal vesicles appear normal.

Other: Postsurgical changes from repair of right inguinal hernia
again noted. No ascites.

Musculoskeletal: No acute or significant osseous findings.
Asymmetric facet hypertrophy on the right at L5-S1.
IMPRESSION: 1. No acute findings or explanation for the patient's symptoms.
Fecalization of the terminal ileum without evidence of bowel
obstruction. The appendix appears normal.
2. Subjective mild hepatic steatosis without acute findings or focal
abnormality.
3. Stable mild splenomegaly without focal abnormality.

## 2018-05-10 MED ORDER — MORPHINE SULFATE (PF) 4 MG/ML IV SOLN
4.0000 mg | Freq: Once | INTRAVENOUS | Status: AC
  Administered 2018-05-10: 4 mg via INTRAVENOUS
  Filled 2018-05-10: qty 1

## 2018-05-10 MED ORDER — SODIUM CHLORIDE 0.9 % IV SOLN
INTRAVENOUS | Status: DC
  Administered 2018-05-10: 12:00:00 via INTRAVENOUS

## 2018-05-10 MED ORDER — DIPHENHYDRAMINE HCL 50 MG/ML IJ SOLN
12.5000 mg | Freq: Once | INTRAMUSCULAR | Status: AC
  Administered 2018-05-10: 12.5 mg via INTRAVENOUS
  Filled 2018-05-10: qty 1

## 2018-05-10 MED ORDER — METOCLOPRAMIDE HCL 5 MG/ML IJ SOLN
10.0000 mg | Freq: Once | INTRAMUSCULAR | Status: AC
  Administered 2018-05-10: 10 mg via INTRAVENOUS
  Filled 2018-05-10: qty 2

## 2018-05-10 MED ORDER — SODIUM CHLORIDE 0.9 % IV BOLUS
1000.0000 mL | Freq: Once | INTRAVENOUS | Status: AC
Start: 2018-05-10 — End: 2018-05-10
  Administered 2018-05-10: 1000 mL via INTRAVENOUS

## 2018-05-10 MED ORDER — IOPAMIDOL (ISOVUE-300) INJECTION 61%
INTRAVENOUS | Status: AC
  Filled 2018-05-10: qty 100

## 2018-05-10 MED ORDER — IOPAMIDOL (ISOVUE-300) INJECTION 61%
100.0000 mL | Freq: Once | INTRAVENOUS | Status: AC | PRN
  Administered 2018-05-10: 100 mL via INTRAVENOUS

## 2018-05-10 MED ORDER — ONDANSETRON 8 MG PO TBDP
8.0000 mg | ORAL_TABLET | Freq: Once | ORAL | Status: AC
Start: 2018-05-10 — End: 2018-05-10
  Administered 2018-05-10: 8 mg via ORAL
  Filled 2018-05-10: qty 1

## 2018-05-10 MED ORDER — ONDANSETRON HCL 4 MG/2ML IJ SOLN
4.0000 mg | Freq: Once | INTRAMUSCULAR | Status: AC
  Administered 2018-05-10: 4 mg via INTRAVENOUS
  Filled 2018-05-10: qty 2

## 2018-05-10 NOTE — ED Notes (Signed)
Pt returned for CT at this time

## 2018-05-10 NOTE — ED Notes (Signed)
Patient transported to CT 

## 2018-05-10 NOTE — ED Notes (Signed)
Bed: [facility]4 Expected date:  Expected time:  Means of arrival:  Comments: 

## 2018-05-10 NOTE — ED Notes (Addendum)
Pt reports having sub sternal chest pain 10/10. Pt reports onset was 5 mins ago. EKG obtained at that time. And MD made aware.

## 2018-05-10 NOTE — ED Notes (Signed)
Bed: WA17 Expected date:  Expected time:  Means of arrival:  Comments: Hold  For triage 4

## 2018-05-10 NOTE — ED Triage Notes (Addendum)
Pt is here for depression. Pt states he has history of depression and had been having more difficulty lately. Pt has appointment with PCP in August but states he can't wait that long. Pt wife states the pt only wants to stay in bed. Pt denies SI.  Pt also complains of headache for the past hour.

## 2018-05-10 NOTE — ED Provider Notes (Signed)
[facility] DEPT Provider Note   CSN: [id number] Arrival date & time: 05/10/18  0947     History   Chief Complaint Chief Complaint  Patient presents with  . Depression  . Headache    HPI Alan Sanders is a 47 y.o. male.  47 year old male presents with acute onset of nausea vomiting which is not developing to right lower quadrant abdominal pain.  Pain characterizes dull and persistent.  No treatment used prior to arrival.  No fever or chills.  No urinary symptoms.  Denies any prior history of abdominal surgeries.  Also has another complaint of depression which is been going on for several weeks has been due to family issues.  Denies any suicidal or homicidal ideations.  Is not a substance abuser.  No hallucinations.  Try to get it with his primary care doctor who stated he would be able to be seen for another 2 months.     Past Medical History:  Diagnosis Date  . Arthritis    " in my back "  . Asthma   . GERD (gastroesophageal reflux disease)   . Seizures ([place])    " its been along time ,since my last seizure "  . Spleen enlarged   . Thrombocytopenia ([place])   . Tobacco abuse     Patient Active Problem List   Diagnosis Date Noted  . Cough 02/13/2015  . Thrombocytopenia ([place]) 02/13/2015  . Chest pain 02/12/2015  . Asthma 02/12/2015  . Tobacco abuse 02/12/2015  . Nausea vomiting and diarrhea 02/12/2015  . Pain in the chest     Past Surgical History:  Procedure Laterality Date  . HERNIA REPAIR          Home Medications    Prior to Admission medications   Medication Sig Start Date End Date Taking? Authorizing Provider  acetaminophen (TYLENOL) 500 MG tablet Take 1 tablet (500 mg total) by mouth every 6 (six) hours as needed. 01/12/18   Law, Bea Graff, PA-C  albuterol (PROVENTIL HFA;VENTOLIN HFA) 108 (90 Base) MCG/ACT inhaler Inhale 2 puffs into the lungs every 6 (six) hours as needed for wheezing or shortness of breath.     [provider]  benzonatate (TESSALON) 100 MG capsule Take 1 capsule (100 mg total) by mouth every 8 (eight) hours. 01/05/18   Tanna Furry, MD  cephALEXin (KEFLEX) 500 MG capsule Take 1 capsule (500 mg total) by mouth 4 (four) times daily. 02/10/18   Molpus, John, MD  diphenoxylate-atropine (LOMOTIL) 2.5-0.025 MG tablet Take 1 tablet by mouth 4 (four) times daily as needed for diarrhea or loose stools. 01/05/18   Tanna Furry, MD  guaiFENesin-codeine [facility]) 100-10 MG/5ML syrup Take 10 mLs by mouth every 6 (six) hours as needed for cough. 01/12/18   Law, Bea Graff, PA-C  HYDROcodone-acetaminophen (NORCO) 5-325 MG tablet Take 1 tablet by mouth every 4 (four) hours as needed (for pain). 02/10/18   Molpus, John, MD  ibuprofen (ADVIL,MOTRIN) 600 MG tablet Take 1 tablet (600 mg total) by mouth every 6 (six) hours as needed. 01/12/18   Frederica Kuster, PA-C    Family History Family History  Problem Relation Age of Onset  . Diabetes Mother   . Hypertension Mother   . COPD Mother     Social History Social History   Tobacco Use  . Smoking status: Current Every Day Smoker    Packs/day: 0.50    Years: 24.00    Pack years: 12.00  Types: Cigarettes  . Smokeless tobacco: Never Used  Substance Use Topics  . Alcohol use: No  . Drug use: No     Allergies   Patient has no known allergies.   Review of Systems Review of Systems  All other systems reviewed and are negative.    Physical Exam Updated Vital Signs BP (!) 141/89   Pulse 86   Temp (!) 97.4 F (36.3 C) (Oral)   Resp 18   SpO2 97%   Physical Exam  Constitutional: He is oriented to person, place, and time. He appears well-developed and well-nourished.  Non-toxic appearance. No distress.  HENT:  Head: Normocephalic and atraumatic.  Eyes: Pupils are equal, round, and reactive to light. Conjunctivae, EOM and lids are normal.  Neck: Normal range of motion. Neck supple. No tracheal deviation present. No thyroid  mass present.  Cardiovascular: Normal rate, regular rhythm and normal heart sounds. Exam reveals no gallop.  No murmur heard. Pulmonary/Chest: Effort normal and breath sounds normal. No stridor. No respiratory distress. He has no decreased breath sounds. He has no wheezes. He has no rhonchi. He has no rales.  Abdominal: Soft. Normal appearance and bowel sounds are normal. He exhibits no distension. There is tenderness in the right lower quadrant. There is guarding. There is no rigidity, no rebound and no CVA tenderness.    Musculoskeletal: Normal range of motion. He exhibits no edema or tenderness.  Neurological: He is alert and oriented to person, place, and time. He has normal strength. No cranial nerve deficit or sensory deficit. GCS eye subscore is 4. GCS verbal subscore is 5. GCS motor subscore is 6.  Skin: Skin is warm and dry. No abrasion and no rash noted.  Psychiatric: His speech is normal and behavior is normal. His affect is blunt. He expresses no suicidal plans and no homicidal plans.  Nursing note and vitals reviewed.    ED Treatments / Results  Labs (all labs ordered are listed, but only abnormal results are displayed) Labs Reviewed - No data to display  EKG None  Radiology No results found.  Procedures Procedures (including critical care time)  Medications Ordered in ED Medications  0.9 %  sodium chloride infusion (has no administration in time range)  sodium chloride 0.9 % bolus 1,000 mL (has no administration in time range)  ondansetron (ZOFRAN) injection 4 mg (has no administration in time range)  morphine 4 MG/ML injection 4 mg (has no administration in time range)     Initial Impression / Assessment and Plan / ED Course  I have reviewed the triage vital signs and the nursing notes.  Pertinent labs & imaging results that were available during my care of the patient were reviewed by me and considered in my medical decision making (see chart for details).      Patient comes in complaining of depression without SI or HI.  He also has some abdominal pain as well as severe headache.  Abdominal and head CTs are negative.  Patient medicated feels much better at this time.  Will be given outpatient resources Final Clinical Impressions(s) / ED Diagnoses   Final diagnoses:  None    ED Discharge Orders    None       Lacretia Leigh, MD 05/10/18 1441

## 2019-02-03 DIAGNOSIS — Z6836 Body mass index (BMI) 36.0-36.9, adult: Secondary | ICD-10-CM | POA: Diagnosis not present

## 2019-02-03 DIAGNOSIS — Z0000 Encounter for general adult medical examination without abnormal findings: Secondary | ICD-10-CM | POA: Diagnosis not present

## 2019-02-14 ENCOUNTER — Other Ambulatory Visit: Payer: Self-pay

## 2019-02-14 ENCOUNTER — Emergency Department (HOSPITAL_COMMUNITY): Payer: 59

## 2019-02-14 ENCOUNTER — Emergency Department (HOSPITAL_COMMUNITY)
Admission: EM | Admit: 2019-02-14 | Discharge: 2019-02-14 | Disposition: A | Discharge status: Home / Self Care | Payer: 59 | Attending: Emergency Medicine | Admitting: Emergency Medicine

## 2019-02-14 ENCOUNTER — Encounter (HOSPITAL_COMMUNITY): Payer: Self-pay | Admitting: Emergency Medicine

## 2019-02-14 DIAGNOSIS — B349 Viral infection, unspecified: Secondary | ICD-10-CM | POA: Insufficient documentation

## 2019-02-14 DIAGNOSIS — F17210 Nicotine dependence, cigarettes, uncomplicated: Secondary | ICD-10-CM | POA: Insufficient documentation

## 2019-02-14 DIAGNOSIS — R05 Cough: Secondary | ICD-10-CM | POA: Diagnosis not present

## 2019-02-14 DIAGNOSIS — J029 Acute pharyngitis, unspecified: Secondary | ICD-10-CM | POA: Diagnosis not present

## 2019-02-14 DIAGNOSIS — R070 Pain in throat: Secondary | ICD-10-CM | POA: Diagnosis present

## 2019-02-14 DIAGNOSIS — J45909 Unspecified asthma, uncomplicated: Secondary | ICD-10-CM | POA: Diagnosis not present

## 2019-02-14 LAB — URINALYSIS, ROUTINE W REFLEX MICROSCOPIC
Bilirubin Urine: NEGATIVE
GLUCOSE, UA: NEGATIVE mg/dL
HGB URINE DIPSTICK: NEGATIVE
KETONES UR: NEGATIVE mg/dL
LEUKOCYTE UA: NEGATIVE
Nitrite: NEGATIVE
PROTEIN: NEGATIVE mg/dL
Specific Gravity, Urine: 1.011 (ref 1.005–1.030)
pH: 5 (ref 5.0–8.0)

## 2019-02-14 LAB — CBC WITH DIFFERENTIAL/PLATELET
Abs Immature Granulocytes: 0.02 10*3/uL (ref 0.00–0.07)
BASOS PCT: 1 %
Basophils Absolute: 0 10*3/uL (ref 0.0–0.1)
EOS ABS: 0.2 10*3/uL (ref 0.0–0.5)
Eosinophils Relative: 2 %
HCT: 45.4 % (ref 39.0–52.0)
Hemoglobin: 15.6 g/dL (ref 13.0–17.0)
Immature Granulocytes: 0 %
Lymphocytes Relative: 20 %
Lymphs Abs: 1.7 10*3/uL (ref 0.7–4.0)
MCH: 29.3 pg (ref 26.0–34.0)
MCHC: 34.4 g/dL (ref 30.0–36.0)
MCV: 85.3 fL (ref 80.0–100.0)
MONO ABS: 0.6 10*3/uL (ref 0.1–1.0)
MONOS PCT: 7 %
Neutro Abs: 6 10*3/uL (ref 1.7–7.7)
Neutrophils Relative %: 70 %
PLATELETS: 128 10*3/uL — AB (ref 150–400)
RBC: 5.32 MIL/uL (ref 4.22–5.81)
RDW: 13.9 % (ref 11.5–15.5)
WBC: 8.5 10*3/uL (ref 4.0–10.5)
nRBC: 0 % (ref 0.0–0.2)

## 2019-02-14 LAB — LACTATE DEHYDROGENASE: LDH: 235 U/L — ABNORMAL HIGH (ref 98–192)

## 2019-02-14 LAB — BASIC METABOLIC PANEL
Anion gap: 10 (ref 5–15)
BUN: 10 mg/dL (ref 6–20)
CALCIUM: 9.5 mg/dL (ref 8.9–10.3)
CO2: 24 mmol/L (ref 22–32)
Chloride: 101 mmol/L (ref 98–111)
Creatinine, Ser: 0.87 mg/dL (ref 0.61–1.24)
GFR calc Af Amer: >60 mL/min (ref >60)
GLUCOSE: 111 mg/dL — AB (ref 70–99)
Potassium: 3.6 mmol/L (ref 3.5–5.1)
Sodium: 135 mmol/L (ref 135–145)

## 2019-02-14 LAB — C-REACTIVE PROTEIN: CRP: 1 mg/dL — ABNORMAL HIGH (ref <1.0)

## 2019-02-14 IMAGING — DX PORTABLE CHEST - 1 VIEW
1 series · 1 of 1 positions shown · non-contrast
Comparison: Prior radiograph from [DATE].

CLINICAL DATA: Initial evaluation for acute sore throat, cough.

EXAM:
PORTABLE CHEST 1 VIEW

[chest ap]
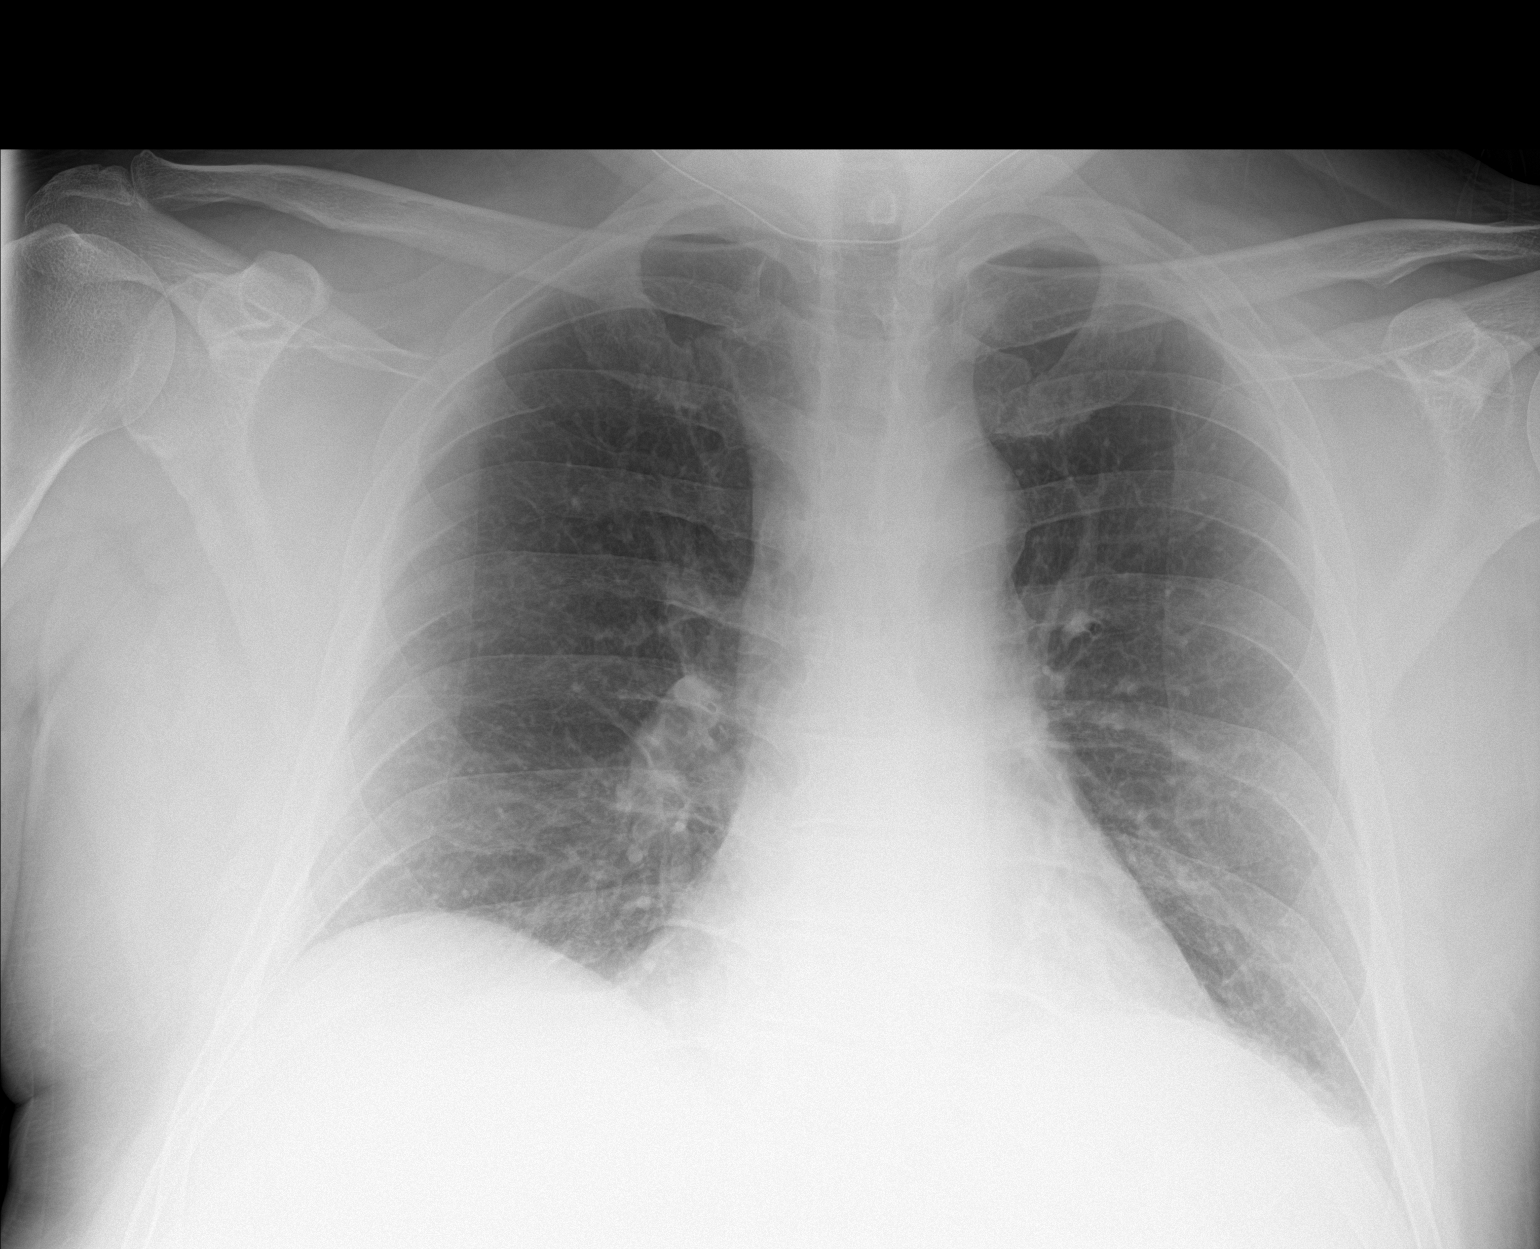

[1 of 1 positions shown; findings below may reference images not displayed]

FINDINGS: Cardiac and mediastinal silhouettes are within normal limits.

Lungs mildly hypoinflated. Mild scattered left basilar subsegmental
atelectasis. Mild scattered bronchitic changes. No consolidative
opacity. No edema or effusion. No pneumothorax.

No acute osseous abnormality.
IMPRESSION: 1. Mild scattered bronchitic changes, which could reflect sequelae
of acute bronchiolitis given provided history of cough. No
consolidative opacity to suggest pneumonia.
2. Superimposed mild left basilar subsegmental atelectasis.

## 2019-02-14 MED ORDER — SODIUM CHLORIDE 0.9% FLUSH
3.0000 mL | Freq: Once | INTRAVENOUS | Status: AC
  Administered 2019-02-14: 3 mL via INTRAVENOUS

## 2019-02-14 MED ORDER — DEXAMETHASONE SODIUM PHOSPHATE 10 MG/ML IJ SOLN
10.0000 mg | Freq: Once | INTRAMUSCULAR | Status: AC
  Administered 2019-02-14: 10 mg via INTRAMUSCULAR
  Filled 2019-02-14: qty 1

## 2019-02-14 MED ORDER — HYDROCODONE-ACETAMINOPHEN 5-325 MG PO TABS
1.0000 | ORAL_TABLET | Freq: Once | ORAL | Status: AC
  Administered 2019-02-14: 1 via ORAL
  Filled 2019-02-14: qty 1

## 2019-02-14 MED ORDER — DIAZEPAM 5 MG PO TABS
5.0000 mg | ORAL_TABLET | Freq: Once | ORAL | Status: AC
  Administered 2019-02-14: 5 mg via ORAL
  Filled 2019-02-14: qty 1

## 2019-02-14 MED ORDER — ALBUTEROL SULFATE HFA 108 (90 BASE) MCG/ACT IN AERS
2.0000 | INHALATION_SPRAY | Freq: Once | RESPIRATORY_TRACT | Status: AC
  Administered 2019-02-14: 2 via RESPIRATORY_TRACT
  Filled 2019-02-14: qty 6.7

## 2019-02-14 NOTE — ED Provider Notes (Signed)
[facility] DEPT Provider Note   CSN: [id number] Arrival date & time: 02/14/19  0141    History   Chief Complaint Chief Complaint  Patient presents with  . Sore Throat  . Nausea    HPI Alan Sanders is a 48 y.o. male.     HPI 48 year old male with history of thrombocytopenia, asthma comes in with chief complaint of nausea, sore throat, shortness of breath and back pain. Patient states that he woke up this morning with sore throat and nausea.  As the day progressed he has been feeling some shortness of breath.  Patient also has been having some back pain, consistent with his prior pain because of degenerative back disease.  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  He denies any known exposures to patients with coronavirus nor does he have any recent travel history.  Patient is unsure if he had a fever.  His cough is mainly dry.  Past Medical History:  Diagnosis Date  . Arthritis    " in my back "  . Asthma   . GERD (gastroesophageal reflux disease)   . Seizures ([place])    " its been along time ,since my last seizure "  . Spleen enlarged   . Thrombocytopenia ([place])   . Tobacco abuse     Patient Active Problem List   Diagnosis Date Noted  . Cough 02/13/2015  . Thrombocytopenia ([place]) 02/13/2015  . Chest pain 02/12/2015  . Asthma 02/12/2015  . Tobacco abuse 02/12/2015  . Nausea vomiting and diarrhea 02/12/2015  . Pain in the chest     Past Surgical History:  Procedure Laterality Date  . HERNIA REPAIR          Home Medications    Prior to Admission medications   Medication Sig Start Date End Date Taking? Authorizing Provider  albuterol (PROVENTIL HFA;VENTOLIN HFA) 108 (90 Base) MCG/ACT inhaler Inhale 2 puffs into the lungs every 6 (six) hours as needed for wheezing or shortness of breath.   Yes [provider]  ibuprofen (ADVIL,MOTRIN) 200 MG tablet Take 400 mg by mouth every 6 (six) hours as needed for headache, mild  pain, moderate pain or cramping.   Yes [provider]  acetaminophen (TYLENOL) 500 MG tablet Take 1 tablet (500 mg total) by mouth every 6 (six) hours as needed. Patient not taking: Reported on 02/14/2019 01/12/18   Frederica Kuster, PA-C  diphenoxylate-atropine (LOMOTIL) 2.5-0.025 MG tablet Take 1 tablet by mouth 4 (four) times daily as needed for diarrhea or loose stools. Patient not taking: Reported on 02/14/2019 01/05/18   Tanna Furry, MD    Family History Family History  Problem Relation Age of Onset  . Diabetes Mother   . Hypertension Mother   . COPD Mother     Social History Social History   Tobacco Use  . Smoking status: Current Every Day Smoker    Packs/day: 0.50    Years: 24.00    Pack years: 12.00    Types: Cigarettes  . Smokeless tobacco: Never Used  Substance Use Topics  . Alcohol use: No  . Drug use: No     Allergies   Patient has no known allergies.   Review of Systems Review of Systems  Constitutional: Positive for activity change.  HENT: Positive for sore throat.   Respiratory: Positive for cough and shortness of breath.   Gastrointestinal: Negative for nausea and vomiting.  Neurological: Negative for dizziness.  All other systems reviewed and are  negative.    Physical Exam Updated Vital Signs BP 108/72 (BP Location: Right Arm)   Pulse 82   Temp 98 F (36.7 C) (Oral)   Resp 14   Ht 6\' 1"  (1.854 m)   Wt 103.9 kg   SpO2 97%   BMI 30.21 kg/m   Physical Exam Vitals signs and nursing note reviewed.  Constitutional:      Appearance: He is well-developed.  HENT:     Head: Atraumatic.  Neck:     Musculoskeletal: Neck supple.  Cardiovascular:     Rate and Rhythm: Normal rate.  Pulmonary:     Effort: Pulmonary effort is normal.     Breath sounds: Wheezing present.  Skin:    General: Skin is warm.  Neurological:     Mental Status: He is alert and oriented to person, place, and time.      ED Treatments / Results  Labs (all  labs ordered are listed, but only abnormal results are displayed) Labs Reviewed  CBC WITH DIFFERENTIAL/PLATELET - Abnormal; Notable for the following components:      Result Value   Platelets 128 (*)    All other components within normal limits  BASIC METABOLIC PANEL - Abnormal; Notable for the following components:   Glucose, Bld 111 (*)    All other components within normal limits  C-REACTIVE PROTEIN - Abnormal; Notable for the following components:   CRP 1.0 (*)    All other components within normal limits  LACTATE DEHYDROGENASE - Abnormal; Notable for the following components:   LDH 235 (*)    All other components within normal limits  URINALYSIS, ROUTINE W REFLEX MICROSCOPIC    EKG None  Radiology No results found.  Procedures Procedures (including critical care time)  Medications Ordered in ED Medications  sodium chloride flush (NS) 0.9 % injection 3 mL (3 mLs Intravenous Given 02/14/19 0248)  albuterol (PROVENTIL HFA;VENTOLIN HFA) 108 (90 Base) MCG/ACT inhaler 2 puff (2 puffs Inhalation Given 02/14/19 0248)     Initial Impression / Assessment and Plan / ED Course  I have reviewed the triage vital signs and the nursing notes.  Pertinent labs & imaging results that were available during my care of the patient were reviewed by me and considered in my medical decision making (see chart for details).        48 year old comes in a chief complaint of cough, nausea, sore throat, shortness of breath.  He is also having back pain. Patient has history of asthma.  On exam he had mild wheezing over the right middle lung field.  There is no respiratory distress.  No focal rhonchi.  Patient has mild cervical lymphadenopathy.  Given that he has a cough without any fevers or white count, I do not think he has a strep infection.  We will not swab him for strep.  CBC ordered and does not show lymphopenia or leukopenia.  CRP is normal, LDH is slightly elevated.  Chest x-ray is  pending.  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  No fevers at this time which is reassuring.  He has asthma, therefore by definition at high risk.  Patient does not meet [place] criteria for coronavirus testing at this time.  We have informed patient the criteria required for emergency testing of coronavirus.  We also discussed the facts surrounding the prognosis and symptoms of coronavirus known to this  point.  Strict ER return precautions have been discussed with the patient and requested return to the emergency room if they start having severe shortness of breath, weakness, fainting, confusion.  Finally we have recommended that they perform self quarantine for 14 days or at least until 3 days after symptoms have resolved.  Patient expresses understanding of return precautions and discharge instructions.  PCP follow-up and [facility]-visit options also discussed.   For the back pain we will give NSAIDs.  He had clear spasms appreciated on exam.  Final Clinical Impressions(s) / ED Diagnoses   Final diagnoses:  Viral syndrome    ED Discharge Orders    None       Varney Biles, MD 02/14/19 [phone]

## 2019-02-14 NOTE — ED Triage Notes (Signed)
Patient came in by self from home. Pt woke up this morning w/ Sore throat, N/V, and diarrhea. Pt states they've been coughing and sneezing. Pt states they have back pain 09/10.      Pt last meal was yesterday at 9pm.

## 2019-02-14 NOTE — Discharge Instructions (Addendum)
Person Name: [name]  Location: [address] [place] [address]    Infection Prevention Recommendations for Individuals not tested for (COVID-19) but requested to self quarantine at Home  Follow the prevention steps below:   Stay home except to get medical care You should restrict activities outside your home, except for getting medical care. Do not go to work, school, or public areas, and do not use public transportation or taxis.  Call ahead before visiting your doctor Before your medical appointment, call the healthcare provider and tell them that you were not tested for covid-19 but that you have some symptoms of COVID-19. This will help the healthcare providers office take steps to keep other people from getting infected.   Monitor your symptoms Seek prompt medical attention if your illness is worsening (e.g., difficulty breathing). Let the medical provider know that you have symptoms similar to COVID-19.  Wear a facemask You should wear a facemask that covers your nose and mouth when you are in the same room with other people and when you visit a healthcare provider. People who live with or visit you should also wear a facemask while they are in the same room with you.  Separate yourself from other people in your home As much as possible, you should stay in a different room from other people in your home. Also, you should use a separate bathroom, if available.  Avoid sharing household items You should not share dishes, drinking glasses, cups, eating utensils, towels, bedding, or other items with other people in your home. After using these items, you should wash them thoroughly with soap and water.  Cover your coughs and sneezes Cover your mouth and nose with a tissue when you cough or sneeze, or you can cough or sneeze into your sleeve. Throw used tissues in a lined trash can, and immediately wash your hands with soap and water for at least 20 seconds  or use an alcohol-based hand rub.  Wash your [facility] your hands often and thoroughly with soap and water for at least 20 seconds. You can use an alcohol-based hand sanitizer if soap and water are not available and if your hands are not visibly dirty. Avoid touching your eyes, nose, and mouth with unwashed hands.   Prevention Steps for Caregivers and Household Members of Individuals who might have COVID-19  If you live with, or provide care at home for, a person who might have COVID-19 infection please follow these guidelines to prevent infection:  Follow healthcare providers instructions Make sure that you understand and can help the patient follow any healthcare provider instructions for all care.  Provide for the patients basic needs You should help the patient with basic needs in the home and provide support for getting groceries, prescriptions, and other personal needs.  Monitor the patients symptoms If they are getting sicker, call his or her medical provider and tell them that the patient might has covid-19 symptoms.  Limit the number of people who have contact with the patient If possible, have only one caregiver for the patient. Other household members should stay in another home or place of residence. If this is not possible, they should stay in another room, or be separated from the patient as much as possible. Use a separate bathroom, if available. Restrict visitors who do not have an essential need to be in the home.  Keep older adults, very young children, and other sick people away from the patient Keep  older adults, very young children, and those who have compromised immune systems or chronic health conditions away from the patient. This includes people with chronic heart, lung, or kidney conditions, diabetes, and cancer.  Ensure good ventilation Make sure that shared spaces in the home have good air flow, such as from an air conditioner or an opened  window, weather permitting.  Wash your hands often Wash your hands often and thoroughly with soap and water for at least 20 seconds. You can use an alcohol based hand sanitizer if soap and water are not available and if your hands are not visibly dirty. Avoid touching your eyes, nose, and mouth with unwashed hands. Use disposable paper towels to dry your hands. If not available, use dedicated cloth towels and replace them when they become wet.  Wear a facemask and gloves Wear a disposable facemask at all times in the room and gloves when you touch or have contact with the patients blood, body fluids, and/or secretions or excretions, such as sweat, saliva, sputum, nasal mucus, vomit, urine, or feces.  Ensure the mask fits over your nose and mouth tightly, and do not touch it during use. Throw out disposable facemasks and gloves after using them. Do not reuse. Wash your hands immediately after removing your facemask and gloves. If your personal clothing becomes contaminated, carefully remove clothing and launder. Wash your hands after handling contaminated clothing. Place all used disposable facemasks, gloves, and other waste in a lined container before disposing them with other household waste. Remove gloves and wash your hands immediately after handling these items.  Do not share dishes, glasses, or other household items with the patient Avoid sharing household items. You should not share dishes, drinking glasses, cups, eating utensils, towels, bedding, or other items with a patient who is confirmed to have, or being evaluated for, COVID-19 infection. After the person uses these items, you should wash them thoroughly with soap and water.  Wash laundry thoroughly Immediately remove and wash clothes or bedding that have blood, body fluids, and/or secretions or excretions, such as sweat, saliva, sputum, nasal mucus, vomit, urine, or feces, on them. Wear gloves when handling laundry from the  patient. Read and follow directions on labels of laundry or clothing items and detergent. In general, wash and dry with the warmest temperatures recommended on the label.  Clean all areas the individual has used often Clean all touchable surfaces, such as counters, tabletops, doorknobs, bathroom fixtures, toilets, phones, keyboards, tablets, and bedside tables, every day. Also, clean any surfaces that may have blood, body fluids, and/or secretions or excretions on them. Wear gloves when cleaning surfaces the patient has come in contact with. Use a diluted bleach solution (e.g., dilute bleach with 1 part bleach and 10 parts water) or a household disinfectant with a label that says EPA-registered for coronaviruses. To make a bleach solution at home, add 1 tablespoon of bleach to 1 quart (4 cups) of water. For a larger supply, add  cup of bleach to 1 gallon (16 cups) of water. Read labels of cleaning products and follow recommendations provided on product labels. Labels contain instructions for safe and effective use of the cleaning product including precautions you should take when applying the product, such as wearing gloves or eye protection and making sure you have good ventilation during use of the product. Remove gloves and wash hands immediately after cleaning.  Monitor yourself for signs and symptoms of illness Caregivers and household members are considered close contacts, should monitor their  health, and will be asked to limit movement outside of the home to the extent possible. Follow the monitoring steps for close contacts listed on the symptom monitoring form.   For the most up-to-date guidance from [facility], please refer to their website: YouBlogs.pl

## 2019-02-22 DIAGNOSIS — Z6830 Body mass index (BMI) 30.0-30.9, adult: Secondary | ICD-10-CM | POA: Diagnosis not present

## 2019-02-22 DIAGNOSIS — J45909 Unspecified asthma, uncomplicated: Secondary | ICD-10-CM | POA: Diagnosis not present

## 2019-02-22 DIAGNOSIS — E669 Obesity, unspecified: Secondary | ICD-10-CM | POA: Diagnosis not present

## 2019-02-22 DIAGNOSIS — M1990 Unspecified osteoarthritis, unspecified site: Secondary | ICD-10-CM | POA: Diagnosis not present

## 2019-02-22 DIAGNOSIS — R945 Abnormal results of liver function studies: Secondary | ICD-10-CM | POA: Diagnosis not present

## 2019-03-10 ENCOUNTER — Encounter (HOSPITAL_COMMUNITY): Payer: Self-pay | Admitting: *Deleted

## 2019-03-10 ENCOUNTER — Emergency Department (HOSPITAL_COMMUNITY)
Admission: EM | Admit: 2019-03-10 | Discharge: 2019-03-10 | Disposition: A | Discharge status: Home / Self Care | Payer: 59 | Attending: Emergency Medicine | Admitting: Emergency Medicine

## 2019-03-10 ENCOUNTER — Other Ambulatory Visit: Payer: Self-pay

## 2019-03-10 DIAGNOSIS — Z79899 Other long term (current) drug therapy: Secondary | ICD-10-CM | POA: Insufficient documentation

## 2019-03-10 DIAGNOSIS — J069 Acute upper respiratory infection, unspecified: Secondary | ICD-10-CM | POA: Diagnosis not present

## 2019-03-10 DIAGNOSIS — F17210 Nicotine dependence, cigarettes, uncomplicated: Secondary | ICD-10-CM | POA: Diagnosis not present

## 2019-03-10 DIAGNOSIS — R519 Headache, unspecified: Secondary | ICD-10-CM

## 2019-03-10 DIAGNOSIS — R51 Headache: Secondary | ICD-10-CM | POA: Diagnosis not present

## 2019-03-10 LAB — CBC WITH DIFFERENTIAL/PLATELET
Abs Immature Granulocytes: 0.03 10*3/uL (ref 0.00–0.07)
Basophils Absolute: 0 10*3/uL (ref 0.0–0.1)
Basophils Relative: 1 %
Eosinophils Absolute: 0.2 10*3/uL (ref 0.0–0.5)
Eosinophils Relative: 2 %
HCT: 48.6 % (ref 39.0–52.0)
Hemoglobin: 16.5 g/dL (ref 13.0–17.0)
Immature Granulocytes: 0 %
Lymphocytes Relative: 19 %
Lymphs Abs: 1.6 10*3/uL (ref 0.7–4.0)
MCH: 28.8 pg (ref 26.0–34.0)
MCHC: 34 g/dL (ref 30.0–36.0)
MCV: 85 fL (ref 80.0–100.0)
Monocytes Absolute: 0.5 10*3/uL (ref 0.1–1.0)
Monocytes Relative: 6 %
Neutro Abs: 6.1 10*3/uL (ref 1.7–7.7)
Neutrophils Relative %: 72 %
Platelets: 137 10*3/uL — ABNORMAL LOW (ref 150–400)
RBC: 5.72 MIL/uL (ref 4.22–5.81)
RDW: 13.7 % (ref 11.5–15.5)
WBC: 8.4 10*3/uL (ref 4.0–10.5)
nRBC: 0 % (ref 0.0–0.2)

## 2019-03-10 LAB — BASIC METABOLIC PANEL
Anion gap: 10 (ref 5–15)
BUN: 8 mg/dL (ref 6–20)
CO2: 25 mmol/L (ref 22–32)
Calcium: 9.4 mg/dL (ref 8.9–10.3)
Chloride: 101 mmol/L (ref 98–111)
Creatinine, Ser: 0.72 mg/dL (ref 0.61–1.24)
GFR calc Af Amer: >60 mL/min (ref >60)
GFR calc non Af Amer: >60 mL/min (ref >60)
Glucose, Bld: 107 mg/dL — ABNORMAL HIGH (ref 70–99)
Potassium: 3.8 mmol/L (ref 3.5–5.1)
Sodium: 136 mmol/L (ref 135–145)

## 2019-03-10 MED ORDER — KETOROLAC TROMETHAMINE 30 MG/ML IJ SOLN
30.0000 mg | Freq: Once | INTRAMUSCULAR | Status: AC
  Administered 2019-03-10: 30 mg via INTRAVENOUS
  Filled 2019-03-10: qty 1

## 2019-03-10 MED ORDER — DEXAMETHASONE SODIUM PHOSPHATE 10 MG/ML IJ SOLN
10.0000 mg | Freq: Once | INTRAMUSCULAR | Status: AC
  Administered 2019-03-10: 06:00:00 10 mg via INTRAVENOUS
  Filled 2019-03-10: qty 1

## 2019-03-10 MED ORDER — SODIUM CHLORIDE 0.9 % IV BOLUS
1000.0000 mL | Freq: Once | INTRAVENOUS | Status: AC
  Administered 2019-03-10: 1000 mL via INTRAVENOUS

## 2019-03-10 MED ORDER — BUTALBITAL-APAP-CAFFEINE 50-325-40 MG PO TABS: 1.0000 | ORAL_TABLET | Freq: Three times a day (TID) | ORAL | 0 refills | Status: AC | PRN

## 2019-03-10 MED ORDER — METOCLOPRAMIDE HCL 10 MG PO TABS: 10.0000 mg | ORAL_TABLET | Freq: Three times a day (TID) | ORAL | 0 refills | Status: DC | PRN

## 2019-03-10 MED ORDER — METOCLOPRAMIDE HCL 5 MG/ML IJ SOLN
10.0000 mg | Freq: Once | INTRAMUSCULAR | Status: AC
  Administered 2019-03-10: 06:00:00 10 mg via INTRAVENOUS
  Filled 2019-03-10: qty 2

## 2019-03-10 MED FILL — BUTALBITAL-APAP-CAFFEINE 50: 50-325-40 | 6 days supply | Qty: 12 | Fill #0

## 2019-03-10 MED FILL — METOCLOPRAMIDE 10 MG TABLET: 10 | 4 days supply | Qty: 12 | Fill #0

## 2019-03-10 NOTE — ED Notes (Signed)
ED Provider at bedside. 

## 2019-03-10 NOTE — ED Notes (Signed)
Pt provided Sprite for Fluid Challenge.

## 2019-03-10 NOTE — ED Triage Notes (Signed)
Pt reports he has had a headache since Saturday. Chills, runny nose and coughing since Tuesday. Vomited x 1 tonight. Reports recently treated for bronchitis. Denies fevers.

## 2019-03-10 NOTE — Discharge Instructions (Signed)
We recommend the use of daily Claritin or Zyrtec for management of nasal congestion.  You were prescribed Reglan to take for management of persistent nausea.  This may also assist in improving any future headache in addition to Fioricet as prescribed.  Follow-up with a primary care doctor to ensure resolution of symptoms.

## 2019-03-10 NOTE — ED Notes (Signed)
Pt denies feeling any nausea or vomiting after drinking sprite.

## 2019-03-10 NOTE — ED Provider Notes (Signed)
[facility] DEPT Provider Note   CSN: [id number] Arrival date & time: 03/10/19  0443    History   Chief Complaint Chief Complaint  Patient presents with  . Headache    HPI Alan Sanders is a 48 y.o. male.    47 year old male with history of thrombocytopenia, asthma presents to the emergency department for evaluation of headache.  He has had a frontal, pressure-like headache x4 days.  This began while at work when the patient needed to tackle a patient and he hit his head on the wall; works security in the ED.  He had no loss of consciousness.  Has a history of migraines which have felt similar.  Has been taking ibuprofen without relief.  Notes intermittent, mild photophobia since onset of his headaches.  Did begin to develop chills with mild rhinorrhea and a nondescript cough 2 days ago.  Symptoms progressed to include nausea tonight with one episode of vomiting.  He denies fevers, sick contacts, blurry vision or vision loss, tinnitus or hearing loss, extremity numbness or paresthesias, extremity weakness, abdominal pain.  Was recently seen in the ED for presumed viral illness 3 weeks ago.  The history is provided by the patient. No language interpreter was used.  Headache    Past Medical History:  Diagnosis Date  . Arthritis    " in my back "  . Asthma   . GERD (gastroesophageal reflux disease)   . Seizures ([place])    " its been along time ,since my last seizure "  . Spleen enlarged   . Thrombocytopenia ([place])   . Tobacco abuse     Patient Active Problem List   Diagnosis Date Noted  . Cough 02/13/2015  . Thrombocytopenia ([place] 02/13/2015  . Chest pain 02/12/2015  . Asthma 02/12/2015  . Tobacco abuse 02/12/2015  . Nausea vomiting and diarrhea 02/12/2015  . Pain in the chest     Past Surgical History:  Procedure Laterality Date  . HERNIA REPAIR          Home Medications    Prior to Admission medications   Medication Sig Start  Date End Date Taking? Authorizing Provider  acetaminophen (TYLENOL) 500 MG tablet Take 1 tablet (500 mg total) by mouth every 6 (six) hours as needed. Patient not taking: Reported on 02/14/2019 01/12/18   Frederica Kuster, PA-C  albuterol (PROVENTIL HFA;VENTOLIN HFA) 108 (90 Base) MCG/ACT inhaler Inhale 2 puffs into the lungs every 6 (six) hours as needed for wheezing or shortness of breath.    [provider]  butalbital-acetaminophen-caffeine (FIORICET, ESGIC) [phone] MG tablet Take 1-2 tablets by mouth every 8 (eight) hours as needed for headache. 03/10/19 03/09/20  Antonietta Breach, PA-C  diphenoxylate-atropine (LOMOTIL) 2.5-0.025 MG tablet Take 1 tablet by mouth 4 (four) times daily as needed for diarrhea or loose stools. Patient not taking: Reported on 02/14/2019 01/05/18   Tanna Furry, MD  ibuprofen (ADVIL,MOTRIN) 200 MG tablet Take 400 mg by mouth every 6 (six) hours as needed for headache, mild pain, moderate pain or cramping.    [provider]  metoCLOPramide (REGLAN) 10 MG tablet Take 1 tablet (10 mg total) by mouth every 8 (eight) hours as needed for nausea. 03/10/19   Antonietta Breach, PA-C    Family History Family History  Problem Relation Age of Onset  . Diabetes Mother   . Hypertension Mother   . COPD Mother     Social History Social History   Tobacco Use  .  Smoking status: Current Every Day Smoker    Packs/day: 0.50    Years: 24.00    Pack years: 12.00    Types: Cigarettes  . Smokeless tobacco: Never Used  Substance Use Topics  . Alcohol use: No  . Drug use: No     Allergies   Patient has no known allergies.   Review of Systems Review of Systems  Neurological: Positive for headaches.  Ten systems reviewed and are negative for acute change, except as noted in the HPI.    Physical Exam Updated Vital Signs BP (!) 154/99 (BP Location: Right Arm)   Pulse 85   Temp 97.8 F (36.6 C) (Oral)   Resp 18   Ht 6\' 1"  (1.854 m)   Wt 105.2 kg   SpO2 96%    BMI 30.61 kg/m   Physical Exam Vitals signs and nursing note reviewed.  Constitutional:      General: He is not in acute distress.    Appearance: He is well-developed. He is not diaphoretic.     Comments: Nontoxic. Does appear mildly fatigued.  HENT:     Head: Normocephalic and atraumatic.     Mouth/Throat:     Mouth: Mucous membranes are moist.     Comments: Symmetric rise of the uvula with phonation Eyes:     General: No scleral icterus.    Extraocular Movements: Extraocular movements intact.     Conjunctiva/sclera: Conjunctivae normal.     Pupils: Pupils are equal, round, and reactive to light.     Comments: No nystagmus  Neck:     Musculoskeletal: Normal range of motion.     Comments: No meningismus Cardiovascular:     Rate and Rhythm: Normal rate and regular rhythm.     Pulses: Normal pulses.     Comments: No meningismus Pulmonary:     Effort: Pulmonary effort is normal. No respiratory distress.     Breath sounds: No stridor. No wheezing, rhonchi or rales.     Comments: Lungs grossly CTAB Musculoskeletal: Normal range of motion.  Skin:    General: Skin is warm and dry.     Coloration: Skin is not pale.     Findings: No erythema or rash.  Neurological:     General: No focal deficit present.     Mental Status: He is alert and oriented to person, place, and time.     Cranial Nerves: No cranial nerve deficit.     Coordination: Coordination normal.     Comments: GCS 15. Speech is goal oriented. No cranial nerve deficits appreciated; symmetric eyebrow raise, no facial drooping, tongue midline. Patient has equal grip strength bilaterally with 5/5 strength against resistance in all major muscle groups bilaterally. Sensation to light touch intact. Patient moves extremities without ataxia.   Psychiatric:        Behavior: Behavior normal.      ED Treatments / Results  Labs (all labs ordered are listed, but only abnormal results are displayed) Labs Reviewed  CBC WITH  DIFFERENTIAL/PLATELET - Abnormal; Notable for the following components:      Result Value   Platelets 137 (*)    All other components within normal limits  BASIC METABOLIC PANEL - Abnormal; Notable for the following components:   Glucose, Bld 107 (*)    All other components within normal limits    EKG None  Radiology No results found.  Procedures Procedures (including critical care time)  Medications Ordered in ED Medications  ketorolac (TORADOL) 30 MG/ML injection 30  mg (has no administration in time range)  sodium chloride 0.9 % bolus 1,000 mL (1,000 mLs Intravenous New Bag/Given 03/10/19 0544)  metoCLOPramide (REGLAN) injection 10 mg (10 mg Intravenous Given 03/10/19 0543)  dexamethasone (DECADRON) injection 10 mg (10 mg Intravenous Given 03/10/19 0542)    6:24 AM Headache has improved from 4/10 down to 2/10. Will give 1 dose of Toradol to offer further improvement. Nausea has subsided. Will PO challenge. Labs reviewed and are stable compared to 3 weeks ago. No fever or leukocytosis today.   Initial Impression / Assessment and Plan / ED Course  I have reviewed the triage vital signs and the nursing notes.  Pertinent labs & imaging results that were available during my care of the patient were reviewed by me and considered in my medical decision making (see chart for details).        Patient presents to the emergency department for evaluation of headache which began 4 days ago.  No fever, nuchal rigidity, meningismus to suggest meningitis.  Neurologic exam today is nonfocal.  He has associated URI symptoms including nondescript cough and congestion with rhinorrhea. Question tension vs sinus headache as cause of pain today.  On reassessment, the patient has had improvement in headache symptoms following a migraine cocktail.  Labs performed and are reassuring.  I do not believe further emergent workup is indicated at this time.  Plan for d/c with Fioricet and Reglan Rx.  Return  precautions discussed and provided.  Patient discharged in stable condition with no unaddressed concerns.   Final Clinical Impressions(s) / ED Diagnoses   Final diagnoses:  Frontal headache  Upper respiratory tract infection, unspecified type    ED Discharge Orders         Ordered    metoCLOPramide (REGLAN) 10 MG tablet  Every 8 hours PRN     03/10/19 0628    butalbital-acetaminophen-caffeine (FIORICET, ESGIC) 50-325-40 MG tablet  Every 8 hours PRN     03/10/19 0628           Antonietta Breach, PA-C 03/10/19 0658    Molpus, Jenny Reichmann, MD 03/16/19 2249

## 2019-03-12 DIAGNOSIS — R51 Headache: Secondary | ICD-10-CM | POA: Diagnosis not present

## 2019-03-12 DIAGNOSIS — Z6830 Body mass index (BMI) 30.0-30.9, adult: Secondary | ICD-10-CM | POA: Diagnosis not present

## 2019-03-12 DIAGNOSIS — R945 Abnormal results of liver function studies: Secondary | ICD-10-CM | POA: Diagnosis not present

## 2019-03-12 DIAGNOSIS — F5101 Primary insomnia: Secondary | ICD-10-CM | POA: Diagnosis not present

## 2019-03-12 DIAGNOSIS — Z1331 Encounter for screening for depression: Secondary | ICD-10-CM | POA: Diagnosis not present

## 2019-03-12 DIAGNOSIS — E669 Obesity, unspecified: Secondary | ICD-10-CM | POA: Diagnosis not present

## 2019-03-12 DIAGNOSIS — M1990 Unspecified osteoarthritis, unspecified site: Secondary | ICD-10-CM | POA: Diagnosis not present

## 2019-03-12 DIAGNOSIS — J45909 Unspecified asthma, uncomplicated: Secondary | ICD-10-CM | POA: Diagnosis not present

## 2019-03-12 MED FILL — MELOXICAM 15 MG TABLET: 15 | 30 days supply | Qty: 30 | Fill #0

## 2019-03-12 MED FILL — AMITRIPTYLINE HCL 50 MG TAB: 50 | 30 days supply | Qty: 30 | Fill #0

## 2019-03-17 DIAGNOSIS — F5101 Primary insomnia: Secondary | ICD-10-CM | POA: Diagnosis not present

## 2019-03-17 DIAGNOSIS — E669 Obesity, unspecified: Secondary | ICD-10-CM | POA: Diagnosis not present

## 2019-03-17 DIAGNOSIS — J45909 Unspecified asthma, uncomplicated: Secondary | ICD-10-CM | POA: Diagnosis not present

## 2019-03-17 DIAGNOSIS — R51 Headache: Secondary | ICD-10-CM | POA: Diagnosis not present

## 2019-03-17 DIAGNOSIS — M1990 Unspecified osteoarthritis, unspecified site: Secondary | ICD-10-CM | POA: Diagnosis not present

## 2019-03-17 DIAGNOSIS — R945 Abnormal results of liver function studies: Secondary | ICD-10-CM | POA: Diagnosis not present

## 2019-03-17 DIAGNOSIS — Z6830 Body mass index (BMI) 30.0-30.9, adult: Secondary | ICD-10-CM | POA: Diagnosis not present

## 2019-03-31 DIAGNOSIS — Z6830 Body mass index (BMI) 30.0-30.9, adult: Secondary | ICD-10-CM | POA: Diagnosis not present

## 2019-03-31 DIAGNOSIS — J45909 Unspecified asthma, uncomplicated: Secondary | ICD-10-CM | POA: Diagnosis not present

## 2019-03-31 DIAGNOSIS — E669 Obesity, unspecified: Secondary | ICD-10-CM | POA: Diagnosis not present

## 2019-03-31 DIAGNOSIS — F5101 Primary insomnia: Secondary | ICD-10-CM | POA: Diagnosis not present

## 2019-03-31 DIAGNOSIS — R51 Headache: Secondary | ICD-10-CM | POA: Diagnosis not present

## 2019-03-31 DIAGNOSIS — M1990 Unspecified osteoarthritis, unspecified site: Secondary | ICD-10-CM | POA: Diagnosis not present

## 2019-03-31 DIAGNOSIS — R7989 Other specified abnormal findings of blood chemistry: Secondary | ICD-10-CM | POA: Diagnosis not present

## 2019-03-31 MED FILL — ZOLPIDEM TARTRATE 5 MG TAB: 5 | 30 days supply | Qty: 30 | Fill #0

## 2019-04-05 MED FILL — MELOXICAM 15 MG TABLET: 15 | 30 days supply | Qty: 30 | Fill #0

## 2019-06-18 DIAGNOSIS — F5101 Primary insomnia: Secondary | ICD-10-CM | POA: Diagnosis not present

## 2019-06-18 DIAGNOSIS — M1990 Unspecified osteoarthritis, unspecified site: Secondary | ICD-10-CM | POA: Diagnosis not present

## 2019-06-18 DIAGNOSIS — Z6830 Body mass index (BMI) 30.0-30.9, adult: Secondary | ICD-10-CM | POA: Diagnosis not present

## 2019-06-18 DIAGNOSIS — M79672 Pain in left foot: Secondary | ICD-10-CM | POA: Diagnosis not present

## 2019-06-18 DIAGNOSIS — R7989 Other specified abnormal findings of blood chemistry: Secondary | ICD-10-CM | POA: Diagnosis not present

## 2019-06-18 DIAGNOSIS — E669 Obesity, unspecified: Secondary | ICD-10-CM | POA: Diagnosis not present

## 2019-06-18 DIAGNOSIS — R51 Headache: Secondary | ICD-10-CM | POA: Diagnosis not present

## 2019-06-18 DIAGNOSIS — J45909 Unspecified asthma, uncomplicated: Secondary | ICD-10-CM | POA: Diagnosis not present

## 2019-06-18 MED FILL — MELOXICAM 15 MG TABLET: 15 | 30 days supply | Qty: 30 | Fill #0

## 2019-06-18 MED FILL — ZOLPIDEM TARTRATE 10 MG TAB: 10 | 30 days supply | Qty: 30 | Fill #0

## 2019-06-24 DIAGNOSIS — E669 Obesity, unspecified: Secondary | ICD-10-CM | POA: Diagnosis not present

## 2019-06-24 DIAGNOSIS — R7989 Other specified abnormal findings of blood chemistry: Secondary | ICD-10-CM | POA: Diagnosis not present

## 2019-06-24 DIAGNOSIS — J45909 Unspecified asthma, uncomplicated: Secondary | ICD-10-CM | POA: Diagnosis not present

## 2019-06-24 DIAGNOSIS — M79672 Pain in left foot: Secondary | ICD-10-CM | POA: Diagnosis not present

## 2019-06-24 DIAGNOSIS — M1990 Unspecified osteoarthritis, unspecified site: Secondary | ICD-10-CM | POA: Diagnosis not present

## 2019-06-24 DIAGNOSIS — Z6830 Body mass index (BMI) 30.0-30.9, adult: Secondary | ICD-10-CM | POA: Diagnosis not present

## 2019-06-24 MED FILL — GABAPENTIN 300 MG CAPSULE: 300 | 30 days supply | Qty: 30 | Fill #0

## 2019-08-25 ENCOUNTER — Emergency Department (HOSPITAL_BASED_OUTPATIENT_CLINIC_OR_DEPARTMENT_OTHER): Payer: 59

## 2019-08-25 ENCOUNTER — Other Ambulatory Visit: Payer: Self-pay

## 2019-08-25 ENCOUNTER — Emergency Department (HOSPITAL_BASED_OUTPATIENT_CLINIC_OR_DEPARTMENT_OTHER)
Admission: EM | Admit: 2019-08-25 | Discharge: 2019-08-26 | Disposition: A | Discharge status: Home / Self Care | Payer: 59 | Attending: Emergency Medicine | Admitting: Emergency Medicine

## 2019-08-25 ENCOUNTER — Encounter (HOSPITAL_BASED_OUTPATIENT_CLINIC_OR_DEPARTMENT_OTHER): Payer: Self-pay | Admitting: *Deleted

## 2019-08-25 DIAGNOSIS — Y999 Unspecified external cause status: Secondary | ICD-10-CM | POA: Diagnosis not present

## 2019-08-25 DIAGNOSIS — M545 Low back pain, unspecified: Secondary | ICD-10-CM

## 2019-08-25 DIAGNOSIS — J45909 Unspecified asthma, uncomplicated: Secondary | ICD-10-CM | POA: Insufficient documentation

## 2019-08-25 DIAGNOSIS — Y93H2 Activity, gardening and landscaping: Secondary | ICD-10-CM | POA: Diagnosis not present

## 2019-08-25 DIAGNOSIS — F17210 Nicotine dependence, cigarettes, uncomplicated: Secondary | ICD-10-CM | POA: Diagnosis not present

## 2019-08-25 DIAGNOSIS — W57XXXA Bitten or stung by nonvenomous insect and other nonvenomous arthropods, initial encounter: Secondary | ICD-10-CM | POA: Insufficient documentation

## 2019-08-25 DIAGNOSIS — Y92007 Garden or yard of unspecified non-institutional (private) residence as the place of occurrence of the external cause: Secondary | ICD-10-CM | POA: Diagnosis not present

## 2019-08-25 DIAGNOSIS — S20212A Contusion of left front wall of thorax, initial encounter: Secondary | ICD-10-CM | POA: Insufficient documentation

## 2019-08-25 DIAGNOSIS — S299XXA Unspecified injury of thorax, initial encounter: Secondary | ICD-10-CM | POA: Diagnosis present

## 2019-08-25 IMAGING — CR DG RIBS W/ CHEST 3+V*L*
4 series · 4 of 4 positions shown · non-contrast
Comparison: Chest radiograph [DATE]. Chest, abdomen, pelvis CT
[DATE]

CLINICAL DATA: Back pain and bruising. Left back/flank area
bruising. History of lumbar spine fracture 8 months ago in the
shower. No new injury.

EXAM:
LEFT RIBS AND CHEST - 3+ VIEW

[w chest pa]
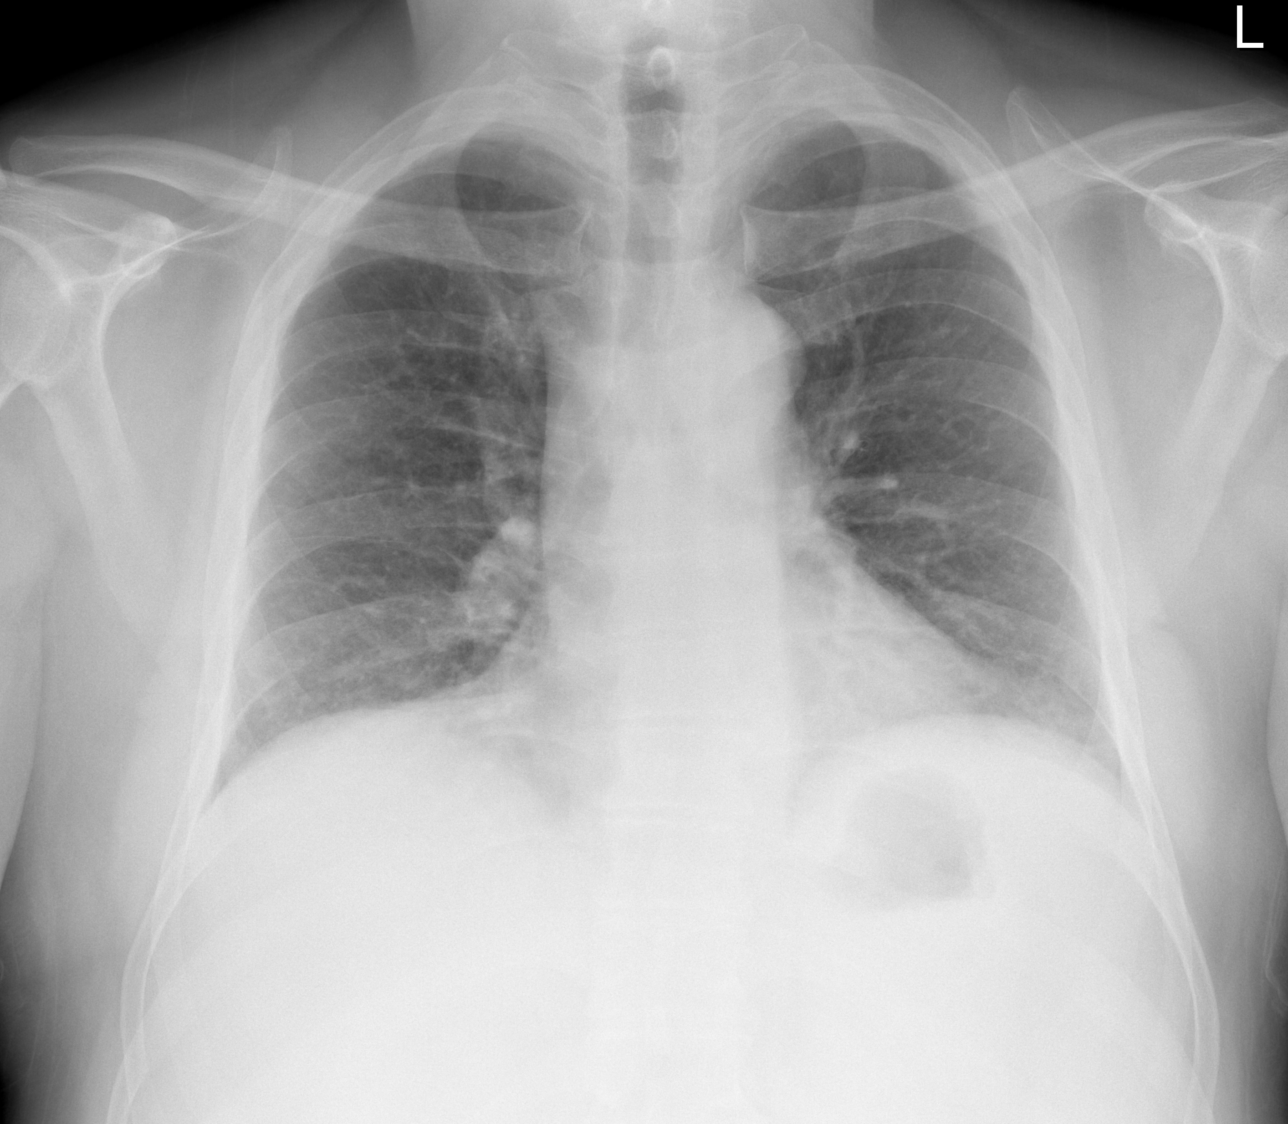

[w ribs ap/pa upper left]
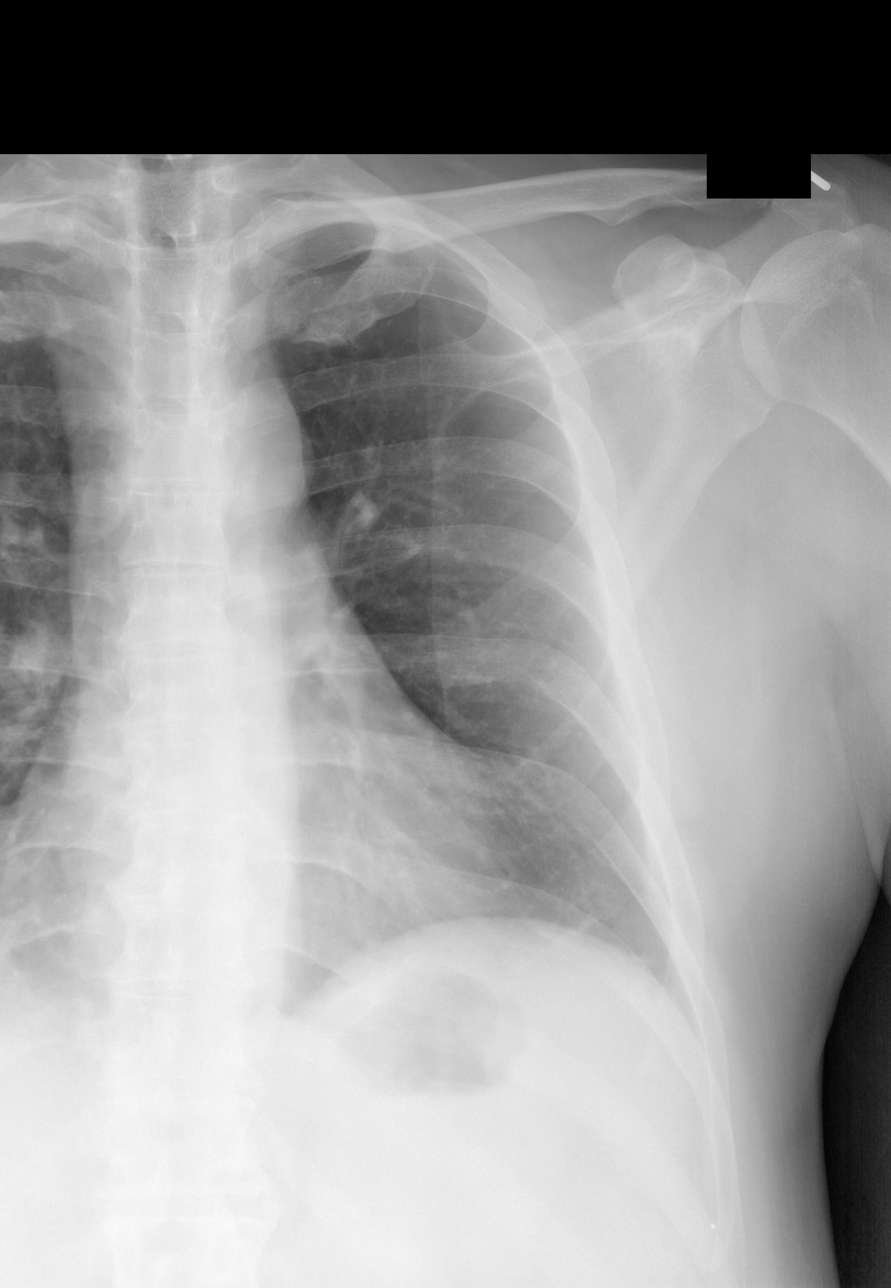

[w ribs ap/pa lower left]
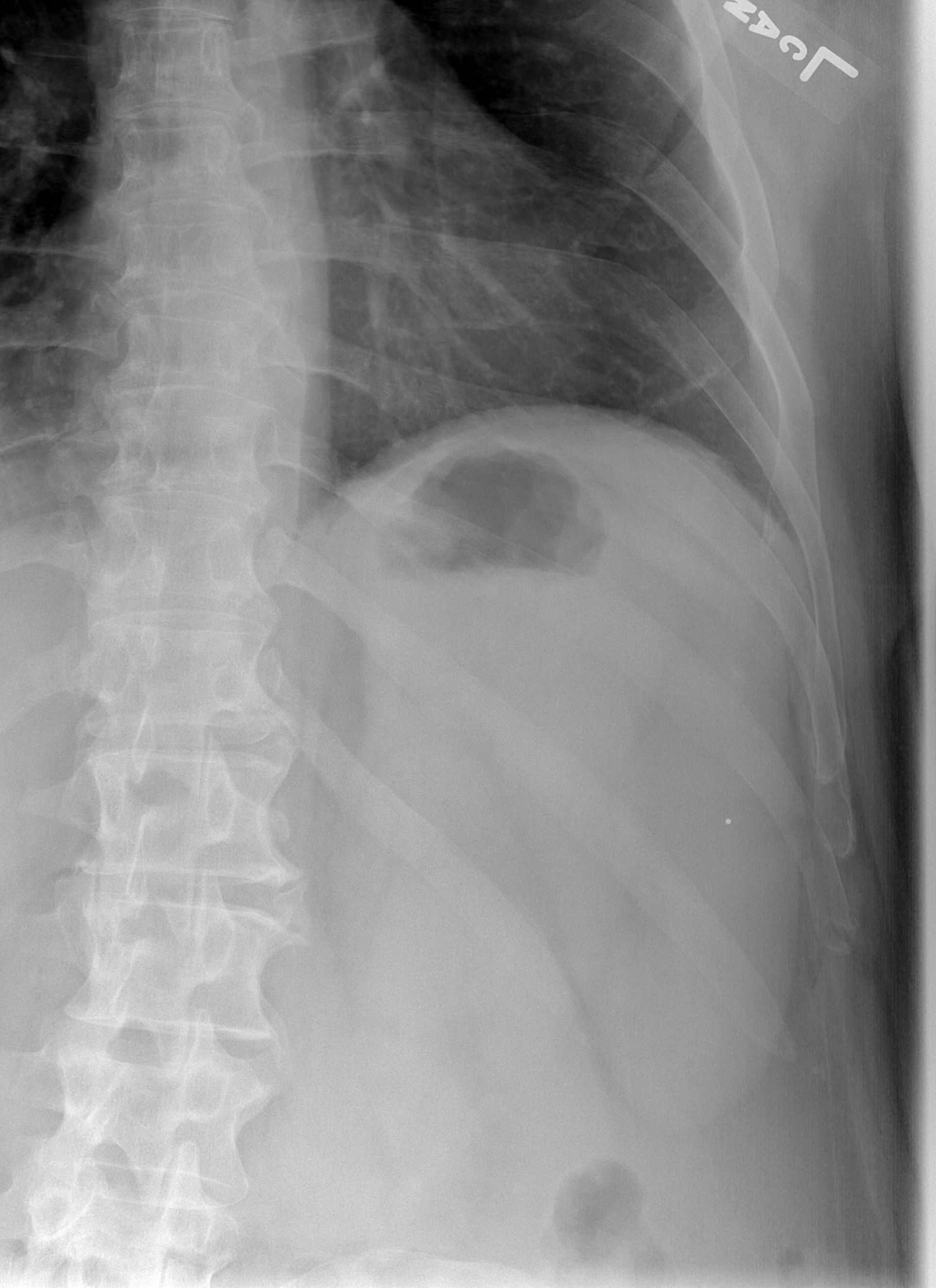

[w ribs oblique left]
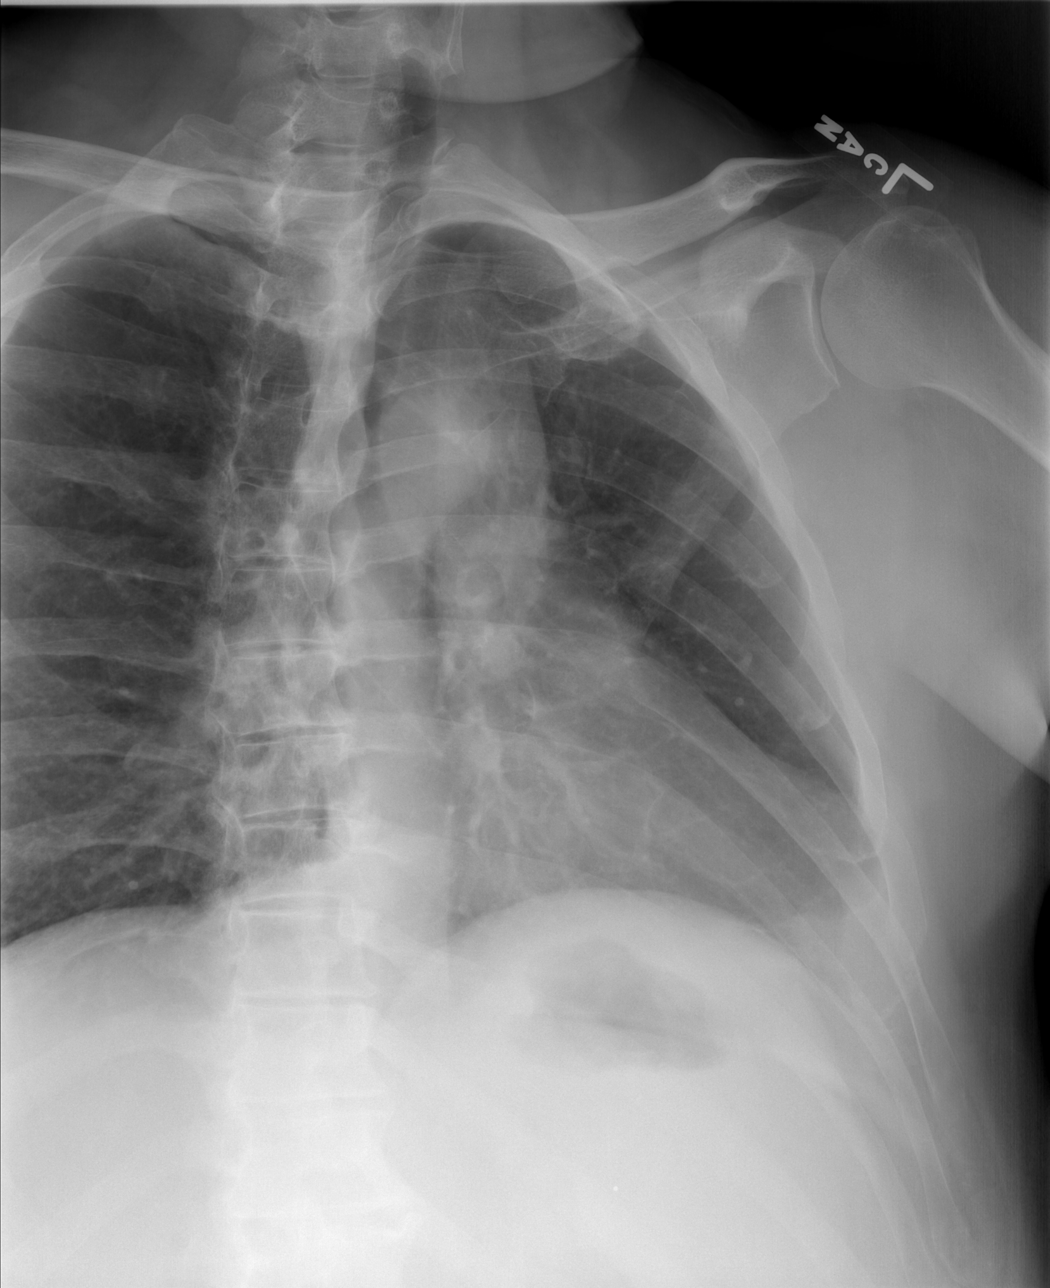

[4 of 4 positions shown; findings below may reference images not displayed]

FINDINGS: No fracture or other bone lesions are seen involving the ribs. There
is no evidence of pneumothorax or pleural effusion. Low lung
volumes. Heart size and mediastinal contours are within normal
limits for technique.
IMPRESSION: Negative radiographs of the chest and left ribs.

## 2019-08-25 NOTE — ED Triage Notes (Signed)
Pt states that he fractured his back after a fall in the shower 8-9 months ago.  Pt states that his back has been intermittently hurting but the last couple of days the pain was worse in the left mid back and today his wife noted a bruise in that location where the pain is.  No recent trauma.

## 2019-08-26 DIAGNOSIS — F17210 Nicotine dependence, cigarettes, uncomplicated: Secondary | ICD-10-CM | POA: Diagnosis not present

## 2019-08-26 DIAGNOSIS — S20212A Contusion of left front wall of thorax, initial encounter: Secondary | ICD-10-CM | POA: Diagnosis not present

## 2019-08-26 DIAGNOSIS — M545 Low back pain: Secondary | ICD-10-CM | POA: Diagnosis not present

## 2019-08-26 DIAGNOSIS — J45909 Unspecified asthma, uncomplicated: Secondary | ICD-10-CM | POA: Diagnosis not present

## 2019-08-26 MED ORDER — DOXYCYCLINE HYCLATE 100 MG PO TABS
200.0000 mg | ORAL_TABLET | Freq: Once | ORAL | Status: AC
  Administered 2019-08-26: 200 mg via ORAL
  Filled 2019-08-26: qty 2

## 2019-08-26 MED ORDER — HYDROCODONE-ACETAMINOPHEN 5-325 MG PO TABS: 1.0000 | ORAL_TABLET | ORAL | 0 refills | Status: DC | PRN

## 2019-08-26 MED FILL — HYDROCODON-APAP 5-325: 5-325 | 3 days supply | Qty: 20 | Fill #0

## 2019-08-26 NOTE — ED Provider Notes (Signed)
[facility] Provider Note: Georgena Spurling, MD, FACEP  CSN: [id number] MRN: [id number] ARRIVAL: 08/25/19 at 2042 ROOM: [place]  Back Pain   HISTORY OF PRESENT ILLNESS  08/26/19 12:19 AM Alan Sanders is a 48 y.o. male who fell in the shower about 8 or 9 months ago fracturing an unspecified bone in his back.  He is here with several days of intermittent left-sided lower back pain.  He mowed the lawn yesterday afternoon and afterwards his wife noticed an ecchymotic area of his left posterior lateral chest wall.  There is associated pain and tenderness.  He rates the pain as an 8 out of 10, worse with palpation or movement.  He is not short of breath.  He has had no new trauma.  He suspects he may have been bitten by something.    Past Medical History:  Diagnosis Date  . Arthritis    " in my back "  . Asthma   . GERD (gastroesophageal reflux disease)   . Seizures ([place])    " its been along time ,since my last seizure "  . Spleen enlarged   . Thrombocytopenia ([place])   . Tobacco abuse     Past Surgical History:  Procedure Laterality Date  . HERNIA REPAIR      Family History  Problem Relation Age of Onset  . Diabetes Mother   . Hypertension Mother   . COPD Mother     Social History   Tobacco Use  . Smoking status: Current Every Day Smoker    Packs/day: 0.50    Years: 24.00    Pack years: 12.00    Types: Cigarettes  . Smokeless tobacco: Never Used  Substance Use Topics  . Alcohol use: No  . Drug use: No    Prior to Admission medications   Medication Sig Start Date End Date Taking? Authorizing Provider  acetaminophen (TYLENOL) 500 MG tablet Take 1 tablet (500 mg total) by mouth every 6 (six) hours as needed. Patient not taking: Reported on 02/14/2019 01/12/18   Frederica Kuster, PA-C  albuterol (PROVENTIL HFA;VENTOLIN HFA) 108 (90 Base) MCG/ACT inhaler Inhale 2 puffs into the lungs every 6 (six) hours as needed for wheezing or  shortness of breath.    [provider]  butalbital-acetaminophen-caffeine (FIORICET, ESGIC) [phone] MG tablet Take 1-2 tablets by mouth every 8 (eight) hours as needed for headache. 03/10/19 03/09/20  Antonietta Breach, PA-C  diphenoxylate-atropine (LOMOTIL) 2.5-0.025 MG tablet Take 1 tablet by mouth 4 (four) times daily as needed for diarrhea or loose stools. Patient not taking: Reported on 02/14/2019 01/05/18   Tanna Furry, MD  ibuprofen (ADVIL,MOTRIN) 200 MG tablet Take 400 mg by mouth every 6 (six) hours as needed for headache, mild pain, moderate pain or cramping.    [provider]  metoCLOPramide (REGLAN) 10 MG tablet Take 1 tablet (10 mg total) by mouth every 8 (eight) hours as needed for nausea. 03/10/19   Antonietta Breach, PA-C    Allergies Patient has no known allergies.   REVIEW OF SYSTEMS  Negative except as noted here or in the History of Present Illness.   PHYSICAL EXAMINATION  Initial Vital Signs Blood pressure (!) 158/97, pulse 98, temperature 98.7 F (37.1 C), temperature source Oral, resp. rate 18, weight 102.1 kg, SpO2 98 %.  Examination General: Well-developed, well-nourished male in no acute distress; appearance consistent with age of record HENT: normocephalic; atraumatic Eyes: Normal appearance Neck: supple Heart: regular rate  and rhythm Lungs: clear to auscultation bilaterally Chest: Tender, ecchymotic area left posterior lateral chest with signs of healing:    Abdomen: soft; nondistended; nontender; bowel sounds present Extremities: No deformity; full range of motion Neurologic: Awake, alert and oriented; motor function intact in all extremities and symmetric; no facial droop Skin: Warm and dry Psychiatric: Normal mood and affect   RESULTS  Summary of this visit's results, reviewed by myself:   EKG Interpretation  Date/Time:    Ventricular Rate:    PR Interval:    QRS Duration:   QT Interval:    QTC Calculation:   R Axis:     Text  Interpretation:        Laboratory Studies: No results found for this or any previous visit (from the past 24 hour(s)). Imaging Studies: Dg Ribs Unilateral W/chest Left  Result Date: 08/25/2019 CLINICAL DATA:  Back pain and bruising. Left back/flank area bruising. History of lumbar spine fracture 8 months ago in the shower. No new injury. EXAM: LEFT RIBS AND CHEST - 3+ VIEW COMPARISON:  Chest radiograph 02/14/2019. Chest, abdomen, pelvis CT 09/23/2018 FINDINGS: No fracture or other bone lesions are seen involving the ribs. There is no evidence of pneumothorax or pleural effusion. Low lung volumes. Heart size and mediastinal contours are within normal limits for technique. IMPRESSION: Negative radiographs of the chest and left ribs. Electronically Signed   By: Keith Rake M.D.   On: 08/25/2019 21:28    ED COURSE and MDM  Nursing notes and initial vitals signs, including pulse oximetry, reviewed.  Vitals:   08/25/19 2053 08/25/19 2059  BP: (!) 158/97   Pulse: 98   Resp: 18   Temp: 98.7 F (37.1 C)   TempSrc: Oral   SpO2: 98%   Weight:  102.1 kg   We will give doxycycline 200 mg for prophylaxis against tickborne diseases as the patient is concerned this may have been a tick bite.  It may not be related to the pain in the low back which preceded the ecchymosis.  The pain in his back appears musculoskeletal in nature.  Radiographs are negative for acute fracture.  PROCEDURES    ED DIAGNOSES     ICD-10-CM   1. Contusion of left chest wall, initial encounter  S20.212A   2. Acute left-sided low back pain without sciatica  M54.5        Tawnya Pujol, Jenny Reichmann, MD 08/26/19 [id number]

## 2019-09-14 ENCOUNTER — Encounter (HOSPITAL_COMMUNITY): Payer: Self-pay

## 2019-09-14 ENCOUNTER — Other Ambulatory Visit: Payer: Self-pay

## 2019-09-14 ENCOUNTER — Emergency Department (HOSPITAL_COMMUNITY)
Admission: EM | Admit: 2019-09-14 | Discharge: 2019-09-14 | Disposition: A | Discharge status: Home / Self Care | Payer: 59 | Attending: Emergency Medicine | Admitting: Emergency Medicine

## 2019-09-14 DIAGNOSIS — F17210 Nicotine dependence, cigarettes, uncomplicated: Secondary | ICD-10-CM | POA: Insufficient documentation

## 2019-09-14 DIAGNOSIS — R0602 Shortness of breath: Secondary | ICD-10-CM | POA: Insufficient documentation

## 2019-09-14 DIAGNOSIS — L299 Pruritus, unspecified: Secondary | ICD-10-CM | POA: Diagnosis present

## 2019-09-14 DIAGNOSIS — T7840XA Allergy, unspecified, initial encounter: Secondary | ICD-10-CM | POA: Insufficient documentation

## 2019-09-14 DIAGNOSIS — R140 Abdominal distension (gaseous): Secondary | ICD-10-CM | POA: Insufficient documentation

## 2019-09-14 DIAGNOSIS — Z8709 Personal history of other diseases of the respiratory system: Secondary | ICD-10-CM | POA: Insufficient documentation

## 2019-09-14 LAB — CBC WITH DIFFERENTIAL/PLATELET
Abs Immature Granulocytes: 0.02 10*3/uL (ref 0.00–0.07)
Basophils Absolute: 0 10*3/uL (ref 0.0–0.1)
Basophils Relative: 1 %
Eosinophils Absolute: 0.1 10*3/uL (ref 0.0–0.5)
Eosinophils Relative: 2 %
HCT: 48.2 % (ref 39.0–52.0)
Hemoglobin: 16.8 g/dL (ref 13.0–17.0)
Immature Granulocytes: 0 %
Lymphocytes Relative: 18 %
Lymphs Abs: 1.5 10*3/uL (ref 0.7–4.0)
MCH: 29.7 pg (ref 26.0–34.0)
MCHC: 34.9 g/dL (ref 30.0–36.0)
MCV: 85.3 fL (ref 80.0–100.0)
Monocytes Absolute: 0.5 10*3/uL (ref 0.1–1.0)
Monocytes Relative: 6 %
Neutro Abs: 6 10*3/uL (ref 1.7–7.7)
Neutrophils Relative %: 73 %
Platelets: 123 10*3/uL — ABNORMAL LOW (ref 150–400)
RBC: 5.65 MIL/uL (ref 4.22–5.81)
RDW: 14.2 % (ref 11.5–15.5)
WBC: 8.2 10*3/uL (ref 4.0–10.5)
nRBC: 0 % (ref 0.0–0.2)

## 2019-09-14 LAB — COMPREHENSIVE METABOLIC PANEL
ALT: 88 U/L — ABNORMAL HIGH (ref 0–44)
AST: 59 U/L — ABNORMAL HIGH (ref 15–41)
Albumin: 4.6 g/dL (ref 3.5–5.0)
Alkaline Phosphatase: 75 U/L (ref 38–126)
Anion gap: 11 (ref 5–15)
BUN: 13 mg/dL (ref 6–20)
CO2: 25 mmol/L (ref 22–32)
Calcium: 10.1 mg/dL (ref 8.9–10.3)
Chloride: 100 mmol/L (ref 98–111)
Creatinine, Ser: 0.92 mg/dL (ref 0.61–1.24)
GFR calc Af Amer: >60 mL/min (ref >60)
GFR calc non Af Amer: >60 mL/min (ref >60)
Glucose, Bld: 173 mg/dL — ABNORMAL HIGH (ref 70–99)
Potassium: 3.4 mmol/L — ABNORMAL LOW (ref 3.5–5.1)
Sodium: 136 mmol/L (ref 135–145)
Total Bilirubin: 0.6 mg/dL (ref 0.3–1.2)
Total Protein: 7.8 g/dL (ref 6.5–8.1)

## 2019-09-14 LAB — PROTIME-INR
INR: 0.9 (ref 0.8–1.2)
Prothrombin Time: 11.8 seconds (ref 11.4–15.2)

## 2019-09-14 MED ORDER — DIPHENHYDRAMINE HCL 50 MG/ML IJ SOLN
25.0000 mg | Freq: Once | INTRAMUSCULAR | Status: AC
  Administered 2019-09-14: 04:00:00 25 mg via INTRAVENOUS
  Filled 2019-09-14: qty 1

## 2019-09-14 MED ORDER — DIPHENHYDRAMINE HCL 25 MG PO TABS: 25.0000 mg | ORAL_TABLET | Freq: Four times a day (QID) | ORAL | 0 refills | Status: DC | PRN

## 2019-09-14 MED ORDER — DIPHENHYDRAMINE HCL 50 MG/ML IJ SOLN
25.0000 mg | Freq: Once | INTRAMUSCULAR | Status: AC
  Administered 2019-09-14: 25 mg via INTRAVENOUS
  Filled 2019-09-14: qty 1

## 2019-09-14 MED ORDER — METHYLPREDNISOLONE SODIUM SUCC 125 MG IJ SOLR
125.0000 mg | Freq: Once | INTRAMUSCULAR | Status: AC
  Administered 2019-09-14: 125 mg via INTRAVENOUS
  Filled 2019-09-14: qty 2

## 2019-09-14 MED ORDER — FAMOTIDINE 20 MG PO TABS: 20.0000 mg | ORAL_TABLET | Freq: Two times a day (BID) | ORAL | 0 refills | Status: DC

## 2019-09-14 MED ORDER — FAMOTIDINE IN NACL 20-0.9 MG/50ML-% IV SOLN
20.0000 mg | Freq: Once | INTRAVENOUS | Status: AC
  Administered 2019-09-14: 20 mg via INTRAVENOUS
  Filled 2019-09-14: qty 50

## 2019-09-14 MED ORDER — PREDNISONE 20 MG PO TABS: 40.0000 mg | ORAL_TABLET | Freq: Every day | ORAL | 0 refills | Status: DC

## 2019-09-14 NOTE — ED Provider Notes (Signed)
[facility] EMERGENCY DEPARTMENT Provider Note   CSN: [id number] Arrival date & time: 09/14/19  0353     History   Chief Complaint Chief Complaint  Patient presents with  . Allergic Reaction  . Urticaria    HPI Alan Sanders is a 48 y.o. male.     Patient presents to the emergency department with chief complaint of pruritus.  He states that about an hour ago he noticed itching on his extremities.  He reports some associated shortness of breath.  Denies any contacts with chemicals or potential allergens.  He states that his abdomen has been "tight."  He is uncertain how long it has been like this.  He denies any medical problems, but has history of seizures, enlarged spleen, thrombocytopenia.  Denies any nausea, vomiting, or diarrhea.  Denies any treatments prior to arrival.  The history is provided by the patient. No language interpreter was used.    Past Medical History:  Diagnosis Date  . Arthritis    " in my back "  . Asthma   . GERD (gastroesophageal reflux disease)   . Seizures ([place])    " its been along time ,since my last seizure "  . Spleen enlarged   . Thrombocytopenia ([place])   . Tobacco abuse     Patient Active Problem List   Diagnosis Date Noted  . Cough 02/13/2015  . Thrombocytopenia ([place]) 02/13/2015  . Chest pain 02/12/2015  . Asthma 02/12/2015  . Tobacco abuse 02/12/2015  . Nausea vomiting and diarrhea 02/12/2015  . Pain in the chest     Past Surgical History:  Procedure Laterality Date  . HERNIA REPAIR          Home Medications    Prior to Admission medications   Medication Sig Start Date End Date Taking? Authorizing Provider  albuterol (PROVENTIL HFA;VENTOLIN HFA) 108 (90 Base) MCG/ACT inhaler Inhale 2 puffs into the lungs every 6 (six) hours as needed for wheezing or shortness of breath.    [provider]  butalbital-acetaminophen-caffeine (FIORICET, ESGIC) [phone] MG tablet Take 1-2 tablets by mouth every  8 (eight) hours as needed for headache. 03/10/19 03/09/20  Antonietta Breach, PA-C  HYDROcodone-acetaminophen (NORCO) 5-325 MG tablet Take 1 tablet by mouth every 4 (four) hours as needed for severe pain. 08/26/19   Molpus, John, MD  ibuprofen (ADVIL,MOTRIN) 200 MG tablet Take 400 mg by mouth every 6 (six) hours as needed for headache, mild pain, moderate pain or cramping.    [provider]  metoCLOPramide (REGLAN) 10 MG tablet Take 1 tablet (10 mg total) by mouth every 8 (eight) hours as needed for nausea. 03/10/19   Antonietta Breach, PA-C    Family History Family History  Problem Relation Age of Onset  . Diabetes Mother   . Hypertension Mother   . COPD Mother     Social History Social History   Tobacco Use  . Smoking status: Current Every Day Smoker    Packs/day: 0.50    Years: 24.00    Pack years: 12.00    Types: Cigarettes  . Smokeless tobacco: Never Used  Substance Use Topics  . Alcohol use: No  . Drug use: No     Allergies   Patient has no known allergies.   Review of Systems Review of Systems  All other systems reviewed and are negative.    Physical Exam Updated Vital Signs SpO2 97%   Physical Exam Vitals signs and nursing note reviewed.  Constitutional:  Appearance: He is well-developed.  HENT:     Head: Normocephalic and atraumatic.  Eyes:     Conjunctiva/sclera: Conjunctivae normal.  Neck:     Musculoskeletal: Neck supple.  Cardiovascular:     Rate and Rhythm: Normal rate and regular rhythm.     Heart sounds: No murmur.  Pulmonary:     Effort: Pulmonary effort is normal. No respiratory distress.     Breath sounds: Normal breath sounds.  Abdominal:     General: There is distension.     Palpations: Abdomen is soft.     Tenderness: There is no abdominal tenderness.  Skin:    General: Skin is warm and dry.     Findings: Rash present.     Comments: Several small erythematous lesions on right hand and left forearm  Neurological:     Mental  Status: He is alert and oriented to person, place, and time.  Psychiatric:        Mood and Affect: Mood normal.        Behavior: Behavior normal.      ED Treatments / Results  Labs (all labs ordered are listed, but only abnormal results are displayed) Labs Reviewed  CBC WITH DIFFERENTIAL/PLATELET - Abnormal; Notable for the following components:      Result Value   Platelets 123 (*)    All other components within normal limits  COMPREHENSIVE METABOLIC PANEL - Abnormal; Notable for the following components:   Potassium 3.4 (*)    Glucose, Bld 173 (*)    AST 59 (*)    ALT 88 (*)    All other components within normal limits  PROTIME-INR    EKG EKG Interpretation  Date/Time:  Wednesday September 14 2019 04:11:55 EDT Ventricular Rate:  99 PR Interval:    QRS Duration: 85 QT Interval:  321 QTC Calculation: 412 R Axis:   83 Text Interpretation:  Sinus rhythm Normal ECG Confirmed by Veryl Speak [phone]) on 09/14/2019 4:30:13 AM   Radiology No results found.  Procedures Procedures (including critical care time)  Medications Ordered in ED Medications  diphenhydrAMINE (BENADRYL) injection 25 mg (has no administration in time range)     Initial Impression / Assessment and Plan / ED Course  I have reviewed the triage vital signs and the nursing notes.  Pertinent labs & imaging results that were available during my care of the patient were reviewed by me and considered in my medical decision making (see chart for details).        Patient with itching that started about an hour prior to arrival.  States that the itching started in his hands.  Denies any known exposures.  Laboratory work-up is reassuring.  No wheezing, no nausea, vomiting, or diarrhea.  No throat swelling sensation.  Doubt anaphylactic reaction.  Unclear etiology at this time.  No improvement with Benadryl.  We will give another dose of Benadryl, Solu-Medrol, and Pepcid.    6:25 AM Patient  reassessed, he is feeling improved.  No longer itching.  States that he feels sleepy, likely from the Benadryl.  Vital signs are stable.  Will discharge to home with strict return precautions.  No wheezing, no throat swelling sensation.  Will treat with Benadryl, Pepcid, and prednisone taper.  Final Clinical Impressions(s) / ED Diagnoses   Final diagnoses:  Allergic reaction, initial encounter    ED Discharge Orders         Ordered    diphenhydrAMINE (BENADRYL) 25 MG tablet  Every 6 hours PRN  09/14/19 0623    famotidine (PEPCID) 20 MG tablet  2 times daily     09/14/19 0623    predniSONE (DELTASONE) 20 MG tablet  Daily     09/14/19 0623           Montine Circle, PA-C 09/14/19 [id number]    Veryl Speak, MD 09/14/19 [phone]

## 2019-09-14 NOTE — Discharge Instructions (Signed)
No clear explanation was found for the itching you experienced tonight.  Your blood work was without any significant abnormalities.  Your liver tests are mildly elevated, but not significantly different from in the past.  Your EKG was normal.  You were given a steroid, Benadryl, and Pepcid.  You have been prescribed the same for home use.  Please return if your symptoms change or worsen.  Please return for chest pain, shortness of breath, or high fever.

## 2019-09-14 NOTE — ED Triage Notes (Signed)
Pt advises of urticaria and erythema noted to both upper extremities x 1 hour.. Alert and oriented. Pt is warm and mildly diaphoretic. Tachypnea w/ mild dyspnea.

## 2019-09-14 NOTE — ED Notes (Signed)
Patient verbalizes understanding of discharge instructions. Opportunity for questioning and answers were provided. Armband removed by staff, pt discharged from ED.  

## 2019-10-22 DIAGNOSIS — F5101 Primary insomnia: Secondary | ICD-10-CM | POA: Diagnosis not present

## 2019-10-22 DIAGNOSIS — R7989 Other specified abnormal findings of blood chemistry: Secondary | ICD-10-CM | POA: Diagnosis not present

## 2019-10-22 DIAGNOSIS — Z6830 Body mass index (BMI) 30.0-30.9, adult: Secondary | ICD-10-CM | POA: Diagnosis not present

## 2019-10-22 DIAGNOSIS — M79671 Pain in right foot: Secondary | ICD-10-CM | POA: Diagnosis not present

## 2019-10-22 DIAGNOSIS — M1990 Unspecified osteoarthritis, unspecified site: Secondary | ICD-10-CM | POA: Diagnosis not present

## 2019-10-22 DIAGNOSIS — E669 Obesity, unspecified: Secondary | ICD-10-CM | POA: Diagnosis not present

## 2019-10-22 DIAGNOSIS — M79672 Pain in left foot: Secondary | ICD-10-CM | POA: Diagnosis not present

## 2019-10-22 DIAGNOSIS — J45909 Unspecified asthma, uncomplicated: Secondary | ICD-10-CM | POA: Diagnosis not present

## 2019-10-27 DIAGNOSIS — M79671 Pain in right foot: Secondary | ICD-10-CM | POA: Diagnosis not present

## 2019-10-27 DIAGNOSIS — M79672 Pain in left foot: Secondary | ICD-10-CM | POA: Diagnosis not present

## 2019-10-28 DIAGNOSIS — G609 Hereditary and idiopathic neuropathy, unspecified: Secondary | ICD-10-CM | POA: Diagnosis not present

## 2019-10-28 DIAGNOSIS — J45909 Unspecified asthma, uncomplicated: Secondary | ICD-10-CM | POA: Diagnosis not present

## 2019-10-28 DIAGNOSIS — Z6830 Body mass index (BMI) 30.0-30.9, adult: Secondary | ICD-10-CM | POA: Diagnosis not present

## 2019-10-28 DIAGNOSIS — R7989 Other specified abnormal findings of blood chemistry: Secondary | ICD-10-CM | POA: Diagnosis not present

## 2019-10-28 DIAGNOSIS — R519 Headache, unspecified: Secondary | ICD-10-CM | POA: Diagnosis not present

## 2019-10-28 DIAGNOSIS — E669 Obesity, unspecified: Secondary | ICD-10-CM | POA: Diagnosis not present

## 2019-10-28 DIAGNOSIS — F5101 Primary insomnia: Secondary | ICD-10-CM | POA: Diagnosis not present

## 2019-10-28 MED FILL — DULoxetine HCL 30 MG CPEP: 30 | 30 days supply | Qty: 30 | Fill #0

## 2019-12-04 DIAGNOSIS — Z20822 Contact with and (suspected) exposure to covid-19: Secondary | ICD-10-CM | POA: Diagnosis not present

## 2020-04-13 ENCOUNTER — Encounter: Payer: Self-pay | Admitting: Family Medicine

## 2020-04-13 ENCOUNTER — Ambulatory Visit (INDEPENDENT_AMBULATORY_CARE_PROVIDER_SITE_OTHER): Payer: 59 | Admitting: Family Medicine

## 2020-04-13 ENCOUNTER — Other Ambulatory Visit: Payer: Self-pay

## 2020-04-13 VITALS — BP 116/74 | HR 83 | Temp 98.2°F | Ht 73.0 in | Wt 234.0 lb

## 2020-04-13 DIAGNOSIS — Z0000 Encounter for general adult medical examination without abnormal findings: Secondary | ICD-10-CM | POA: Diagnosis not present

## 2020-04-13 DIAGNOSIS — F341 Dysthymic disorder: Secondary | ICD-10-CM | POA: Diagnosis not present

## 2020-04-13 DIAGNOSIS — Q6672 Congenital pes cavus, left foot: Secondary | ICD-10-CM

## 2020-04-13 DIAGNOSIS — Q6671 Congenital pes cavus, right foot: Secondary | ICD-10-CM | POA: Diagnosis not present

## 2020-04-13 DIAGNOSIS — M79671 Pain in right foot: Secondary | ICD-10-CM | POA: Diagnosis not present

## 2020-04-13 DIAGNOSIS — Z720 Tobacco use: Secondary | ICD-10-CM

## 2020-04-13 DIAGNOSIS — M79672 Pain in left foot: Secondary | ICD-10-CM

## 2020-04-13 LAB — HEMOGLOBIN A1C: Hgb A1c MFr Bld: 5.3 % (ref 4.6–6.5)

## 2020-04-13 LAB — LIPID PANEL
Cholesterol: 164 mg/dL (ref 0–200)
HDL: 29.4 mg/dL — ABNORMAL LOW (ref >39.00)
LDL Cholesterol: 109 mg/dL — ABNORMAL HIGH (ref 0–99)
NonHDL: 134.6
Total CHOL/HDL Ratio: 6
Triglycerides: 127 mg/dL (ref 0.0–149.0)
VLDL: 25.4 mg/dL (ref 0.0–40.0)

## 2020-04-13 LAB — URINALYSIS, ROUTINE W REFLEX MICROSCOPIC
Bilirubin Urine: NEGATIVE
Hgb urine dipstick: NEGATIVE
Ketones, ur: NEGATIVE
Leukocytes,Ua: NEGATIVE
Nitrite: NEGATIVE
RBC / HPF: NONE SEEN (ref 0–?)
Specific Gravity, Urine: >=1.03 — AB (ref 1.000–1.030)
Total Protein, Urine: NEGATIVE
Urine Glucose: NEGATIVE
Urobilinogen, UA: 0.2 (ref 0.0–1.0)
WBC, UA: NONE SEEN (ref 0–?)
pH: 5.5 (ref 5.0–8.0)

## 2020-04-13 LAB — COMPREHENSIVE METABOLIC PANEL
ALT: 65 U/L — ABNORMAL HIGH (ref 0–53)
AST: 36 U/L (ref 0–37)
Albumin: 4.9 g/dL (ref 3.5–5.2)
Alkaline Phosphatase: 76 U/L (ref 39–117)
BUN: 9 mg/dL (ref 6–23)
CO2: 28 mEq/L (ref 19–32)
Calcium: 9.9 mg/dL (ref 8.4–10.5)
Chloride: 104 mEq/L (ref 96–112)
Creatinine, Ser: 0.72 mg/dL (ref 0.40–1.50)
GFR: 116.03 mL/min (ref >60.00)
Glucose, Bld: 103 mg/dL — ABNORMAL HIGH (ref 70–99)
Potassium: 3.9 mEq/L (ref 3.5–5.1)
Sodium: 137 mEq/L (ref 135–145)
Total Bilirubin: 0.6 mg/dL (ref 0.2–1.2)
Total Protein: 7.4 g/dL (ref 6.0–8.3)

## 2020-04-13 LAB — CBC
HCT: 47 % (ref 39.0–52.0)
Hemoglobin: 16.4 g/dL (ref 13.0–17.0)
MCHC: 35 g/dL (ref 30.0–36.0)
MCV: 83.2 fl (ref 78.0–100.0)
Platelets: 118 10*3/uL — ABNORMAL LOW (ref 150.0–400.0)
RBC: 5.64 Mil/uL (ref 4.22–5.81)
RDW: 14.1 % (ref 11.5–15.5)
WBC: 9.2 10*3/uL (ref 4.0–10.5)

## 2020-04-13 LAB — LDL CHOLESTEROL, DIRECT: Direct LDL: 118 mg/dL

## 2020-04-13 LAB — TSH: TSH: 2.51 u[IU]/mL (ref 0.35–4.50)

## 2020-04-13 MED ORDER — PAROXETINE HCL 10 MG PO TABS: 10.0000 mg | ORAL_TABLET | Freq: Every day | ORAL | 1 refills | Status: DC

## 2020-04-13 MED FILL — PARoxetine HCL 10 MG TABS: 10 | 30 days supply | Qty: 30 | Fill #0

## 2020-04-13 NOTE — Patient Instructions (Addendum)
Health Maintenance, Male Adopting a healthy lifestyle and getting preventive care are important in promoting health and wellness. Ask your health care provider about:  The right schedule for you to have regular tests and exams.  Things you can do on your own to prevent diseases and keep yourself healthy. What should I know about diet, weight, and exercise? Eat a healthy diet   Eat a diet that includes plenty of vegetables, fruits, low-fat dairy products, and lean protein.  Do not eat a lot of foods that are high in solid fats, added sugars, or sodium. Maintain a healthy weight Body mass index (BMI) is a measurement that can be used to identify possible weight problems. It estimates body fat based on height and weight. Your health care provider can help determine your BMI and help you achieve or maintain a healthy weight. Get regular exercise Get regular exercise. This is one of the most important things you can do for your health. Most adults should:  Exercise for at least 150 minutes each week. The exercise should increase your heart rate and make you sweat (moderate-intensity exercise).  Do strengthening exercises at least twice a week. This is in addition to the moderate-intensity exercise.  Spend less time sitting. Even light physical activity can be beneficial. Watch cholesterol and blood lipids Have your blood tested for lipids and cholesterol at 49 years of age, then have this test every 5 years. You may need to have your cholesterol levels checked more often if:  Your lipid or cholesterol levels are high.  You are older than 49 years of age.  You are at high risk for heart disease. What should I know about cancer screening? Many types of cancers can be detected early and may often be prevented. Depending on your health history and family history, you may need to have cancer screening at various ages. This may include screening for:  Colorectal cancer.  Prostate  cancer.  Skin cancer.  Lung cancer. What should I know about heart disease, diabetes, and high blood pressure? Blood pressure and heart disease  High blood pressure causes heart disease and increases the risk of stroke. This is more likely to develop in people who have high blood pressure readings, are of African descent, or are overweight.  Talk with your health care provider about your target blood pressure readings.  Have your blood pressure checked: ? Every 3-5 years if you are 18-39 years of age. ? Every year if you are 40 years old or older.  If you are between the ages of 65 and 75 and are a current or former smoker, ask your health care provider if you should have a one-time screening for abdominal aortic aneurysm (AAA). Diabetes Have regular diabetes screenings. This checks your fasting blood sugar level. Have the screening done:  Once every three years after age 45 if you are at a normal weight and have a low risk for diabetes.  More often and at a younger age if you are overweight or have a high risk for diabetes. What should I know about preventing infection? Hepatitis B If you have a higher risk for hepatitis B, you should be screened for this virus. Talk with your health care provider to find out if you are at risk for hepatitis B infection. Hepatitis C Blood testing is recommended for:  Everyone born from 1945 through 1965.  Anyone with known risk factors for hepatitis C. Sexually transmitted infections (STIs)  You should be screened each year   for STIs, including gonorrhea and chlamydia, if: ? You are sexually active and are younger than 49 years of age. ? You are older than 49 years of age and your health care provider tells you that you are at risk for this type of infection. ? Your sexual activity has changed since you were last screened, and you are at increased risk for chlamydia or gonorrhea. Ask your health care provider if you are at risk.  Ask your  health care provider about whether you are at high risk for HIV. Your health care provider may recommend a prescription medicine to help prevent HIV infection. If you choose to take medicine to prevent HIV, you should first get tested for HIV. You should then be tested every 3 months for as long as you are taking the medicine. Follow these instructions at home: Lifestyle  Do not use any products that contain nicotine or tobacco, such as cigarettes, e-cigarettes, and chewing tobacco. If you need help quitting, ask your health care provider.  Do not use street drugs.  Do not share needles.  Ask your health care provider for help if you need support or information about quitting drugs. Alcohol use  Do not drink alcohol if your health care provider tells you not to drink.  If you drink alcohol: ? Limit how much you have to 0-2 drinks a day. ? Be aware of how much alcohol is in your drink. In the [place] one drink equals one 12 oz bottle of beer (355 mL), one 5 oz glass of wine (148 mL), or one 1 oz glass of hard liquor (44 mL). General instructions  Schedule regular health, dental, and eye exams.  Stay current with your vaccines.  Tell your health care provider if: ? You often feel depressed. ? You have ever been abused or do not feel safe at home. Summary  Adopting a healthy lifestyle and getting preventive care are important in promoting health and wellness.  Follow your health care provider's instructions about healthy diet, exercising, and getting tested or screened for diseases.  Follow your health care provider's instructions on monitoring your cholesterol and blood pressure. This information is not intended to replace advice given to you by your health care provider. Make sure you discuss any questions you have with your health care provider. Document Revised: 11/03/2018 Document Reviewed: 11/03/2018 Elsevier Patient Education  2020 [place] Years  Old, Male Preventive care refers to lifestyle choices and visits with your health care provider that can promote health and wellness. This includes:  A yearly physical exam. This is also called an annual well check.  Regular dental and eye exams.  Immunizations.  Screening for certain conditions.  Healthy lifestyle choices, such as eating a healthy diet, getting regular exercise, not using drugs or products that contain nicotine and tobacco, and limiting alcohol use. What can I expect for my preventive care visit? Physical exam Your health care provider will check:  Height and weight. These may be used to calculate body mass index (BMI), which is a measurement that tells if you are at a healthy weight.  Heart rate and blood pressure.  Your skin for abnormal spots. Counseling Your health care provider may ask you questions about:  Alcohol, tobacco, and drug use.  Emotional well-being.  Home and relationship well-being.  Sexual activity.  Eating habits.  Work and work Statistician. What immunizations do I need?  Influenza (flu) vaccine  This is recommended every year. Tetanus, diphtheria,  and pertussis (Tdap) vaccine  You may need a Td booster every 10 years. Varicella (chickenpox) vaccine  You may need this vaccine if you have not already been vaccinated. Zoster (shingles) vaccine  You may need this after age 78. Measles, mumps, and rubella (MMR) vaccine  You may need at least one dose of MMR if you were born in 1957 or later. You may also need a second dose. Pneumococcal conjugate (PCV13) vaccine  You may need this if you have certain conditions and were not previously vaccinated. Pneumococcal polysaccharide (PPSV23) vaccine  You may need one or two doses if you smoke cigarettes or if you have certain conditions. Meningococcal conjugate (MenACWY) vaccine  You may need this if you have certain conditions. Hepatitis A vaccine  You may need this if you have  certain conditions or if you travel or work in places where you may be exposed to hepatitis A. Hepatitis B vaccine  You may need this if you have certain conditions or if you travel or work in places where you may be exposed to hepatitis B. Haemophilus influenzae type b (Hib) vaccine  You may need this if you have certain risk factors. Human papillomavirus (HPV) vaccine  If recommended by your health care provider, you may need three doses over 6 months. You may receive vaccines as individual doses or as more than one vaccine together in one shot (combination vaccines). Talk with your health care provider about the risks and benefits of combination vaccines. What tests do I need? Blood tests  Lipid and cholesterol levels. These may be checked every 5 years, or more frequently if you are over 44 years old.  Hepatitis C test.  Hepatitis B test. Screening  Lung cancer screening. You may have this screening every year starting at age 106 if you have a 30-pack-year history of smoking and currently smoke or have quit within the past 15 years.  Prostate cancer screening. Recommendations will vary depending on your family history and other risks.  Colorectal cancer screening. All adults should have this screening starting at age 39 and continuing until age 70. Your health care provider may recommend screening at age 24 if you are at increased risk. You will have tests every 1-10 years, depending on your results and the type of screening test.  Diabetes screening. This is done by checking your blood sugar (glucose) after you have not eaten for a while (fasting). You may have this done every 1-3 years.  Sexually transmitted disease (STD) testing. Follow these instructions at home: Eating and drinking  Eat a diet that includes fresh fruits and vegetables, whole grains, lean protein, and low-fat dairy products.  Take vitamin and mineral supplements as recommended by your health care  provider.  Do not drink alcohol if your health care provider tells you not to drink.  If you drink alcohol: ? Limit how much you have to 0-2 drinks a day. ? Be aware of how much alcohol is in your drink. In the [place] one drink equals one 12 oz bottle of beer (355 mL), one 5 oz glass of wine (148 mL), or one 1 oz glass of hard liquor (44 mL). Lifestyle  Take daily care of your teeth and gums.  Stay active. Exercise for at least 30 minutes on 5 or more days each week.  Do not use any products that contain nicotine or tobacco, such as cigarettes, e-cigarettes, and chewing tobacco. If you need help quitting, ask your health care provider.  If  you are sexually active, practice safe sex. Use a condom or other form of protection to prevent STIs (sexually transmitted infections).  Talk with your health care provider about taking a low-dose aspirin every day starting at age 84. What's next?  Go to your health care provider once a year for a well check visit.  Ask your health care provider how often you should have your eyes and teeth checked.  Stay up to date on all vaccines. This information is not intended to replace advice given to you by your health care provider. Make sure you discuss any questions you have with your health care provider. Document Revised: 11/04/2018 Document Reviewed: 11/04/2018 Elsevier Patient Education  2020 [place], Adult Foot care is an important part of your health. Noticing and addressing any potential problems early is the best way to prevent future foot problems. How to care for your [place] your feet daily with warm water and mild soap. Do not use hot water. Then, pat your feet and the areas between your toes until they are completely dry. Do not soak your feet as this can dry your skin.  Trim your toenails straight across. Do not dig under them or around the cuticle. File the edges of your nails with an emery board or  nail file.  On the skin on your feet and on dry, brittle nails, apply a moisturizing lotion or petroleum jelly that is unscented and does not contain alcohol. Do not apply lotion between your toes. Shoes and Socks   Wear clean socks or stockings every day. Make sure they are not too tight.  Wear shoes that fit properly and have enough cushioning. To break in new shoes, wear them for just a few hours a day. This prevents you from injuring your feet. Always look in your shoes before you put them on to be sure there are no objects inside. Wounds, Scrapes, Corns, and Calluses  Check your feet daily for blisters, cuts, and redness. If you cannot see the bottom of your feet, use a mirror or ask someone for help.  Do not cut corns or calluses. Do not try to remove them with medicine.  If you find a minor scrape, cut, or break in the skin on your feet, keep it and the skin around it clean and dry. These areas may be cleaned with mild soap and water. Do not clean the area with peroxide, alcohol, or iodine.  If you have a wound, scrape, corn, or callus on your foot, look at it several times a day to make sure it is healing and is not infected. Check for: ? More redness, swelling, or pain. ? More fluid or blood. ? Warmth. ? Pus or a bad smell. General Instructions  Do not cross your legs. That may decrease the blood flow to your feet.  Do not use heating pads or hot water bottles on your feet. They may burn your skin. If you have lost feeling in your feet or legs, you may not know it is happening until it is too late.  Make sure your health care provider does a complete foot exam at least annually or more often if you have foot problems. If you have foot problems, report any cuts, sores, or bruises to your health care provider immediately. Contact a health care provider if:  You have a medical condition that increases your risk of infection and you have any cuts, sores, or bruises on  your  feet.  You have an injury that is not healing.  You notice redness on your legs or feet.  You feel burning or tingling in your legs or feet.  You have pain or cramps in your legs or feet.  Your legs or feet are numb.  Your feet always feel cold.  You have pain around a toenail. Get help right away if:  You have a wound, scrape, corn, or callus on your foot and: ? You have more redness, swelling, or pain. ? You have more fluid or blood. ? Your wound, scrape, corn, or callus feels warm to the touch. ? You have pus or a bad smell coming from the wound, scrape, corn, or callus. ? You have a fever.  You have a red line going up your leg. This information is not intended to replace advice given to you by your health care provider. Make sure you discuss any questions you have with your health care provider. Document Revised: 11/27/2016 Document Reviewed: 04/18/2016 Elsevier Patient Education  [place].

## 2020-04-13 NOTE — Addendum Note (Signed)
Addended by: Lynnea Ferrier on: 04/13/2020 03:50 PM   Modules accepted: Orders

## 2020-04-13 NOTE — Progress Notes (Signed)
New Patient Office Visit  Subjective:  Patient ID: Alan Sanders, male    DOB: [birth year]  Age: 49 y.o. MRN: [id number]  CC:  Chief Complaint  Patient presents with  . Establish Care    New patient, C/O feet pain losing balance a lot. Patient states that wife has dementia would like something to help with that.     HPI Alan Sanders presents for establishment of care and discuss some problems he has been having.  He has bilateral foot pain.  Distant history of injuries to his feet but none recent.  Feet feel like they are bruised on the inside and the outside.  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  He walks a great deal.  He feels as though he has balance issues.  He is stressed and sad.  His wife is been recently diagnosed with dementia and has been difficult for him to deal with this.  Distant history of a seizure disorder but spontaneously resolved.  Last seizure was back in the 90s.  He does not drink alcohol or use illicit drugs he is smoking.  Chart reviewed shows elevated blood sugars.  He has no history of diabetes he tells me.  Past Medical History:  Diagnosis Date  . Arthritis    " in my back "  . Asthma   . GERD (gastroesophageal reflux disease)   . Seizures ([place])    " its been along time ,since my last seizure "  . Spleen enlarged   . Thrombocytopenia ([place])   . Tobacco abuse     Past Surgical History:  Procedure Laterality Date  . HERNIA REPAIR      Family History  Problem Relation Age of Onset  . Diabetes Mother   . Hypertension Mother   . COPD Mother     Social History   Socioeconomic History  . Marital status: Married    Spouse name: Not on file  . Number of children: Not on file  . Years of education: Not on file  . Highest education level: Not on file  Occupational History  . Not on file  Tobacco Use  . Smoking status: Current Every Day Smoker    Packs/day: 0.50    Years: 24.00    Pack years: 12.00    Types: Cigarettes  . Smokeless  tobacco: Never Used  Substance and Sexual Activity  . Alcohol use: No  . Drug use: No  . Sexual activity: Not on file  Other Topics Concern  . Not on file  Social History Narrative  . Not on file   Social Determinants of Health   Financial Resource Strain:   . Difficulty of Paying Living Expenses:   Food Insecurity:   . Worried About Charity fundraiser in the Last Year:   . Arboriculturist in the Last Year:   Transportation Needs:   . Film/video editor (Medical):   Marland Kitchen Lack of Transportation (Non-Medical):   Physical Activity:   . Days of Exercise per Week:   . Minutes of Exercise per Session:   Stress:   . Feeling of Stress :   Social Connections:   . Frequency of Communication with Friends and Family:   . Frequency of Social Gatherings with Friends and Family:   . Attends Religious Services:   . Active Member of Clubs or Organizations:   . Attends Archivist Meetings:   Marland Kitchen Marital Status:   Intimate Partner Violence:   .  Fear of Current or Ex-Partner:   . Emotionally Abused:   Marland Kitchen Physically Abused:   . Sexually Abused:     ROS Review of Systems  Constitutional: Negative.   HENT: Negative.   Eyes: Negative for photophobia and visual disturbance.  Respiratory: Negative.   Cardiovascular: Negative.   Gastrointestinal: Negative.   Endocrine: Negative for polyphagia and polyuria.  Genitourinary: Negative.   Musculoskeletal: Positive for arthralgias. Negative for myalgias.  Allergic/Immunologic: Negative for immunocompromised state.  Neurological: Negative for tremors, seizures and speech difficulty.  Psychiatric/Behavioral: Positive for dysphoric mood. The patient is nervous/anxious.     Objective:   Today's Vitals: BP 116/74   Pulse 83   Temp 98.2 F (36.8 C) (Tympanic)   Ht 6\' 1"  (1.854 m)   Wt 234 lb (106.1 kg)   SpO2 95%   BMI 30.87 kg/m   Physical Exam Vitals and nursing note reviewed.  Constitutional:      General: He is not in  acute distress.    Appearance: Normal appearance. He is not ill-appearing, toxic-appearing or diaphoretic.  HENT:     Head: Normocephalic and atraumatic.     Right Ear: Tympanic membrane, ear canal and external ear normal.     Left Ear: Tympanic membrane, ear canal and external ear normal.     Nose: No congestion.     Mouth/Throat:     Mouth: Mucous membranes are moist.     Pharynx: No oropharyngeal exudate or posterior oropharyngeal erythema.  Eyes:     General: No scleral icterus.       Right eye: No discharge.        Left eye: No discharge.     Extraocular Movements: Extraocular movements intact.     Conjunctiva/sclera: Conjunctivae normal.     Pupils: Pupils are equal, round, and reactive to light.  Cardiovascular:     Rate and Rhythm: Normal rate and regular rhythm.  Pulmonary:     Effort: Pulmonary effort is normal.     Breath sounds: Normal breath sounds.  Abdominal:     General: There is no distension.     Palpations: There is no mass.     Tenderness: There is no abdominal tenderness. There is no guarding or rebound.     Hernia: No hernia is present.  Musculoskeletal:     Cervical back: No rigidity or tenderness.     Right lower leg: No edema.     Left lower leg: No edema.     Right foot: Normal range of motion and normal capillary refill. No swelling or laceration. Normal pulse.     Left foot: Normal range of motion and normal capillary refill. No swelling or laceration. Normal pulse.       Legs:  Lymphadenopathy:     Cervical: No cervical adenopathy.  Skin:    General: Skin is warm and dry.  Neurological:     Mental Status: He is alert and oriented to person, place, and time.  Psychiatric:        Mood and Affect: Mood normal.        Behavior: Behavior normal.     Assessment & Plan:   Problem List Items Addressed This Visit      Other   Tobacco abuse    Other Visit Diagnoses    Dysthymia    -  Primary   Relevant Medications   PARoxetine (PAXIL) 10 MG  tablet   Healthcare maintenance       Relevant Orders  CBC   Comprehensive metabolic panel   LDL cholesterol, direct   Hemoglobin A1c   Lipid panel   HIV Antibody (routine testing w rflx)   Urinalysis, Routine w reflex microscopic   TSH   Congenital cavus deformity of both feet       Pain in both feet       Relevant Orders   Ambulatory referral to Sports Medicine      Outpatient Encounter Medications as of 04/13/2020  Medication Sig  . albuterol (PROVENTIL HFA;VENTOLIN HFA) 108 (90 Base) MCG/ACT inhaler Inhale 2 puffs into the lungs every 6 (six) hours as needed for wheezing or shortness of breath.  . diphenhydrAMINE (BENADRYL) 25 MG tablet Take 1 tablet (25 mg total) by mouth every 6 (six) hours as needed.  . famotidine (PEPCID) 20 MG tablet Take 1 tablet (20 mg total) by mouth 2 (two) times daily. (Patient not taking: Reported on 04/13/2020)  . HYDROcodone-acetaminophen (NORCO) 5-325 MG tablet Take 1 tablet by mouth every 4 (four) hours as needed for severe pain. (Patient not taking: Reported on 04/13/2020)  . ibuprofen (ADVIL,MOTRIN) 200 MG tablet Take 400 mg by mouth every 6 (six) hours as needed for headache, mild pain, moderate pain or cramping.  . metoCLOPramide (REGLAN) 10 MG tablet Take 1 tablet (10 mg total) by mouth every 8 (eight) hours as needed for nausea. (Patient not taking: Reported on 04/13/2020)  . PARoxetine (PAXIL) 10 MG tablet Take 1 tablet (10 mg total) by mouth daily.  . predniSONE (DELTASONE) 20 MG tablet Take 2 tablets (40 mg total) by mouth daily. Take 40 mg by mouth daily for 3 days, then 20mg  by mouth daily for 3 days, then 10mg  daily for 3 days (Patient not taking: Reported on 04/13/2020)   No facility-administered encounter medications on file as of 04/13/2020.    Follow-up: Return in about 1 month (around 05/14/2020).   Patient was given information on health maintenance disease prevention, foot hygiene.   Libby Maw, MD

## 2020-04-16 LAB — HIV ANTIBODY (ROUTINE TESTING W REFLEX): HIV 1&2 Ab, 4th Generation: NONREACTIVE

## 2020-04-30 DIAGNOSIS — R112 Nausea with vomiting, unspecified: Secondary | ICD-10-CM | POA: Diagnosis not present

## 2020-04-30 DIAGNOSIS — R197 Diarrhea, unspecified: Secondary | ICD-10-CM | POA: Diagnosis not present

## 2020-04-30 DIAGNOSIS — R1032 Left lower quadrant pain: Secondary | ICD-10-CM | POA: Diagnosis not present

## 2020-05-01 ENCOUNTER — Ambulatory Visit: Payer: 59 | Admitting: Family Medicine

## 2020-05-01 DIAGNOSIS — R1032 Left lower quadrant pain: Secondary | ICD-10-CM | POA: Diagnosis not present

## 2020-05-01 DIAGNOSIS — R197 Diarrhea, unspecified: Secondary | ICD-10-CM | POA: Diagnosis not present

## 2020-05-01 DIAGNOSIS — R112 Nausea with vomiting, unspecified: Secondary | ICD-10-CM | POA: Diagnosis not present

## 2020-05-14 ENCOUNTER — Ambulatory Visit: Payer: 59 | Admitting: Family Medicine

## 2020-05-16 MED FILL — PARoxetine HCL 10 MG TABS: 10 | 30 days supply | Qty: 30 | Fill #1

## 2020-05-21 DIAGNOSIS — R197 Diarrhea, unspecified: Secondary | ICD-10-CM | POA: Diagnosis not present

## 2020-05-21 DIAGNOSIS — R112 Nausea with vomiting, unspecified: Secondary | ICD-10-CM | POA: Diagnosis not present

## 2020-05-21 DIAGNOSIS — R109 Unspecified abdominal pain: Secondary | ICD-10-CM | POA: Diagnosis not present

## 2020-05-21 MED FILL — AZITHROMYCIN 250 MG TABS: 250 | 5 days supply | Qty: 6 | Fill #0

## 2020-05-21 MED FILL — PROMETHAZINE 25 MG TABLET: 25 | 3 days supply | Qty: 10 | Fill #0

## 2020-05-22 ENCOUNTER — Other Ambulatory Visit: Payer: Self-pay

## 2020-05-22 ENCOUNTER — Telehealth (INDEPENDENT_AMBULATORY_CARE_PROVIDER_SITE_OTHER): Payer: 59 | Admitting: Family

## 2020-05-22 ENCOUNTER — Encounter: Payer: Self-pay | Admitting: Family

## 2020-05-22 VITALS — Wt 233.0 lb

## 2020-05-22 DIAGNOSIS — K591 Functional diarrhea: Secondary | ICD-10-CM

## 2020-05-22 NOTE — Progress Notes (Signed)
Virtual Visit via Video   I connected with patient on 05/22/20 at  9:20 AM EDT by a video enabled telemedicine application and verified that I am speaking with the correct person using two identifiers.  Location patient: Home Location provider: [facility], Office Persons participating in the virtual visit: Patient, Provider, CMA I discussed the limitations of evaluation and management by telemedicine and the availability of in person appointments. The patient expressed understanding and agreed to proceed.  Subjective:   HPI:   Patient is in today with c/o Diarrhea and nausea that has been ongoing x 3 weeks. It is accompanied by abdominal pain, 6/10 and feeling bloated. He had a CT scan done at [facility] that was normal. He went back to the hospital last night and was prescribed a zpak and zofran but has not started the medication. She wanted to speak to a provider prior to starting. He is inquiring about a referral to GI. He has never had diarrhea  ROS:   See pertinent positives and negatives per HPI.  Patient Active Problem List   Diagnosis Date Noted  . Cough 02/13/2015  . Thrombocytopenia ([place]) 02/13/2015  . Chest pain 02/12/2015  . Asthma 02/12/2015  . Tobacco abuse 02/12/2015  . Nausea vomiting and diarrhea 02/12/2015  . Pain in the chest     Social History   Tobacco Use  . Smoking status: Current Every Day Smoker    Packs/day: 0.50    Years: 24.00    Pack years: 12.00    Types: Cigarettes  . Smokeless tobacco: Never Used  Substance Use Topics  . Alcohol use: No    Current Outpatient Medications:  .  albuterol (PROVENTIL HFA;VENTOLIN HFA) 108 (90 Base) MCG/ACT inhaler, Inhale 2 puffs into the lungs every 6 (six) hours as needed for wheezing or shortness of breath., Disp: , Rfl:  .  diphenhydrAMINE (BENADRYL) 25 MG tablet, Take 1 tablet (25 mg total) by mouth every 6 (six) hours as needed., Disp: 30 tablet, Rfl: 0 .  famotidine (PEPCID) 20 MG  tablet, Take 1 tablet (20 mg total) by mouth 2 (two) times daily., Disp: 30 tablet, Rfl: 0 .  ibuprofen (ADVIL,MOTRIN) 200 MG tablet, Take 400 mg by mouth every 6 (six) hours as needed for headache, mild pain, moderate pain or cramping., Disp: , Rfl:  .  metoCLOPramide (REGLAN) 10 MG tablet, Take 1 tablet (10 mg total) by mouth every 8 (eight) hours as needed for nausea., Disp: 12 tablet, Rfl: 0 .  PARoxetine (PAXIL) 10 MG tablet, Take 1 tablet (10 mg total) by mouth daily., Disp: 30 tablet, Rfl: 1 .  azithromycin (ZITHROMAX) 250 MG tablet, Take by mouth. (Patient not taking: Reported on 05/22/2020), Disp: , Rfl:  .  HYDROcodone-acetaminophen (NORCO) 5-325 MG tablet, Take 1 tablet by mouth every 4 (four) hours as needed for severe pain. (Patient not taking: Reported on 05/22/2020), Disp: 20 tablet, Rfl: 0 .  ondansetron (ZOFRAN-ODT) 4 MG disintegrating tablet, Take 4-8 mg by mouth every 8 (eight) hours as needed. (Patient not taking: Reported on 05/22/2020), Disp: , Rfl:  .  predniSONE (DELTASONE) 20 MG tablet, Take 2 tablets (40 mg total) by mouth daily. Take 40 mg by mouth daily for 3 days, then 20mg  by mouth daily for 3 days, then 10mg  daily for 3 days (Patient not taking: Reported on 04/13/2020), Disp: 12 tablet, Rfl: 0 .  promethazine (PHENERGAN) 25 MG tablet, Take 25 mg by mouth every 6 (six) hours as needed. (Patient  not taking: Reported on 05/22/2020), Disp: , Rfl:   No Known Allergies  Objective:   Wt 233 lb (105.7 kg)   BMI 30.74 kg/m   Patient is well-developed, well-nourished in no acute distress.  Resting comfortably at home.  Head is normocephalic, atraumatic.  No labored breathing.  Speech is clear and coherent with logical content.  Patient is alert and oriented at baseline.    Assessment and Plan:    Alan Sanders was seen today for abdominal pain.  Diagnoses and all orders for this visit:  Functional diarrhea -     Ambulatory referral to Gastroenterology     Kennyth Arnold, [place] 05/22/2020

## 2020-05-22 NOTE — Patient Instructions (Signed)

## 2020-06-05 ENCOUNTER — Emergency Department (HOSPITAL_COMMUNITY)
Admission: EM | Admit: 2020-06-05 | Discharge: 2020-06-05 | Disposition: A | Discharge status: Home / Self Care | Payer: 59 | Attending: Emergency Medicine | Admitting: Emergency Medicine

## 2020-06-05 ENCOUNTER — Emergency Department (HOSPITAL_COMMUNITY): Payer: 59

## 2020-06-05 ENCOUNTER — Encounter (HOSPITAL_COMMUNITY): Payer: Self-pay | Admitting: *Deleted

## 2020-06-05 ENCOUNTER — Other Ambulatory Visit: Payer: Self-pay

## 2020-06-05 DIAGNOSIS — J45909 Unspecified asthma, uncomplicated: Secondary | ICD-10-CM | POA: Insufficient documentation

## 2020-06-05 DIAGNOSIS — F17210 Nicotine dependence, cigarettes, uncomplicated: Secondary | ICD-10-CM | POA: Diagnosis not present

## 2020-06-05 DIAGNOSIS — S0990XA Unspecified injury of head, initial encounter: Secondary | ICD-10-CM | POA: Diagnosis not present

## 2020-06-05 DIAGNOSIS — Y990 Civilian activity done for income or pay: Secondary | ICD-10-CM | POA: Diagnosis not present

## 2020-06-05 DIAGNOSIS — Y9389 Activity, other specified: Secondary | ICD-10-CM | POA: Insufficient documentation

## 2020-06-05 DIAGNOSIS — Z79899 Other long term (current) drug therapy: Secondary | ICD-10-CM | POA: Insufficient documentation

## 2020-06-05 DIAGNOSIS — Y92538 Other ambulatory health services establishments as the place of occurrence of the external cause: Secondary | ICD-10-CM | POA: Insufficient documentation

## 2020-06-05 DIAGNOSIS — S060X0A Concussion without loss of consciousness, initial encounter: Secondary | ICD-10-CM | POA: Insufficient documentation

## 2020-06-05 DIAGNOSIS — R6884 Jaw pain: Secondary | ICD-10-CM | POA: Insufficient documentation
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
## 2020-06-05 MED ORDER — OXYCODONE-ACETAMINOPHEN 5-325 MG PO TABS
2.0000 | ORAL_TABLET | Freq: Once | ORAL | Status: AC
  Administered 2020-06-05: 2 via ORAL
  Filled 2020-06-05: qty 2

## 2020-06-05 MED ORDER — DIPHENHYDRAMINE HCL 50 MG/ML IJ SOLN
12.5000 mg | Freq: Once | INTRAMUSCULAR | Status: AC
  Administered 2020-06-05: 12.5 mg via INTRAVENOUS
  Filled 2020-06-05: qty 1

## 2020-06-05 MED ORDER — METOCLOPRAMIDE HCL 5 MG/ML IJ SOLN
10.0000 mg | Freq: Once | INTRAMUSCULAR | Status: AC
  Administered 2020-06-05: 10 mg via INTRAVENOUS
  Filled 2020-06-05: qty 2

## 2020-06-05 NOTE — Discharge Instructions (Signed)
Please read instructions below. You can treat your headache with over-the-counter medications such as tylenol as needed. Stay hydrated and get plenty of rest. Limit your screen time and complex thinking. Avoid any contact sports/activities to prevent re-injury to your head. Follow up with your primary care provider in 1 week for re-check and to be cleared to return to normal activity. Return to the ER if you develop severely worsening headache, changes in your vision, persistent vomiting, or new or concerning symptoms.

## 2020-06-05 NOTE — ED Notes (Signed)
Unable to obtain oral temp due to nausea.
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
## 2020-06-05 NOTE — ED Notes (Signed)
Patient verbalizes understanding of discharge instructions. Opportunity for questioning and answers were provided. Armband removed by staff, pt discharged from ED ambulatory to home.  

## 2020-06-26 ENCOUNTER — Other Ambulatory Visit: Payer: Self-pay | Admitting: Family Medicine

## 2020-06-26 DIAGNOSIS — F341 Dysthymic disorder: Secondary | ICD-10-CM

## 2020-06-26 MED FILL — PARoxetine HCL 10 MG TABS: 10 | 30 days supply | Qty: 30 | Fill #0

## 2020-07-23 DIAGNOSIS — M25572 Pain in left ankle and joints of left foot: Secondary | ICD-10-CM | POA: Diagnosis not present

## 2020-07-23 DIAGNOSIS — M25571 Pain in right ankle and joints of right foot: Secondary | ICD-10-CM | POA: Diagnosis not present

## 2020-08-06 MED FILL — PARoxetine HCL 10 MG TABS: 10 | 30 days supply | Qty: 30 | Fill #1

## 2020-09-19 ENCOUNTER — Other Ambulatory Visit: Payer: Self-pay | Admitting: Family Medicine

## 2020-09-19 DIAGNOSIS — F341 Dysthymic disorder: Secondary | ICD-10-CM

## 2020-09-20 ENCOUNTER — Other Ambulatory Visit: Payer: Self-pay | Admitting: Family Medicine

## 2020-09-20 MED FILL — PARoxetine HCL 10 MG TABS: 10 | 30 days supply | Qty: 30 | Fill #0

## 2020-10-10 ENCOUNTER — Encounter: Payer: Self-pay | Admitting: *Deleted

## 2020-10-12 ENCOUNTER — Encounter: Payer: Self-pay | Admitting: Diagnostic Neuroimaging

## 2020-10-12 ENCOUNTER — Ambulatory Visit: Payer: 59 | Admitting: Diagnostic Neuroimaging

## 2020-10-12 VITALS — BP 134/79 | HR 87 | Ht 74.0 in | Wt 228.0 lb

## 2020-10-12 DIAGNOSIS — R200 Anesthesia of skin: Secondary | ICD-10-CM | POA: Diagnosis not present

## 2020-10-12 DIAGNOSIS — H499 Unspecified paralytic strabismus: Secondary | ICD-10-CM | POA: Diagnosis not present

## 2020-10-12 DIAGNOSIS — R531 Weakness: Secondary | ICD-10-CM | POA: Diagnosis not present

## 2020-10-12 NOTE — Patient Instructions (Signed)
-   check labs, MRI, EMG testing

## 2020-10-12 NOTE — Progress Notes (Signed)
[facility]  PATIENT: Alan Sanders DOB: [birth year]  REFERRING CLINICIAN: Erle Crocker, MD HISTORY FROM: patient  REASON FOR VISIT: new consult    HISTORICAL  CHIEF COMPLAINT:  Chief Complaint  Patient presents with  . Pain    rm 7 New Pt "8 months- feet hurt, feel bruised inside; I'm off balance alot"    HISTORY OF PRESENT ILLNESS:   49 year old male here for evaluation of lower extremity weakness.  January February 2021 patient had onset of bilateral ankle and foot pain sensation.  He developed numbness and balance difficulty.  He has chronic low back pain.  He is not noticed any problems with his vision, face or arms.  Symptoms have been worsening over time.  Patient went to orthopedic clinic for evaluation was diagnosed with lower extremity weakness and referred for electrical testing.  No prodromal accidents injuries or trauma.  He was prescribed AFOs but having difficult time using them.  He works as a Presenter, broadcasting at [employer]: Full 14 system review of systems performed and negative with exception of: As per HPI.  ALLERGIES: No Known Allergies  HOME MEDICATIONS: Outpatient Medications Prior to Visit  Medication Sig Dispense Refill  . albuterol (PROVENTIL HFA;VENTOLIN HFA) 108 (90 Base) MCG/ACT inhaler Inhale 2 puffs into the lungs every 6 (six) hours as needed for wheezing or shortness of breath.    Marland Kitchen ibuprofen (ADVIL,MOTRIN) 200 MG tablet Take 400 mg by mouth every 6 (six) hours as needed for headache, mild pain, moderate pain or cramping.    Marland Kitchen UNKNOWN TO PATIENT "somethng to calm me down"    . famotidine (PEPCID) 20 MG tablet Take 1 tablet (20 mg total) by mouth 2 (two) times daily. (Patient not taking: Reported on 10/12/2020) 30 tablet 0  . PARoxetine (PAXIL) 10 MG tablet TAKE 1 TABLET BY MOUTH ONCE DAILY (Patient not taking: Reported on 10/12/2020) 30 tablet 2  . azithromycin (ZITHROMAX) 250 MG  tablet Take by mouth. (Patient not taking: Reported on 05/22/2020)    . diphenhydrAMINE (BENADRYL) 25 MG tablet Take 1 tablet (25 mg total) by mouth every 6 (six) hours as needed. 30 tablet 0  . HYDROcodone-acetaminophen (NORCO) 5-325 MG tablet Take 1 tablet by mouth every 4 (four) hours as needed for severe pain. (Patient not taking: Reported on 05/22/2020) 20 tablet 0  . metoCLOPramide (REGLAN) 10 MG tablet Take 1 tablet (10 mg total) by mouth every 8 (eight) hours as needed for nausea. 12 tablet 0  . ondansetron (ZOFRAN-ODT) 4 MG disintegrating tablet Take 4-8 mg by mouth every 8 (eight) hours as needed. (Patient not taking: Reported on 05/22/2020)    . predniSONE (DELTASONE) 20 MG tablet Take 2 tablets (40 mg total) by mouth daily. Take 40 mg by mouth daily for 3 days, then 20mg  by mouth daily for 3 days, then 10mg  daily for 3 days (Patient not taking: Reported on 04/13/2020) 12 tablet 0  . promethazine (PHENERGAN) 25 MG tablet Take 25 mg by mouth every 6 (six) hours as needed. (Patient not taking: Reported on 05/22/2020)     No facility-administered medications prior to visit.    PAST MEDICAL HISTORY: Past Medical History:  Diagnosis Date  . Arthritis    " in my back "  . Asthma   . Balance problem   . Gait difficulty   . GERD (gastroesophageal reflux disease)   . Seizures ([place])    " its been along  time ,since my last seizure "  . Spleen enlarged   . Thrombocytopenia ([place])   . Tobacco abuse     PAST SURGICAL HISTORY: Past Surgical History:  Procedure Laterality Date  . HERNIA REPAIR     inguinal    FAMILY HISTORY: Family History  Problem Relation Age of Onset  . Diabetes Mother   . Hypertension Mother   . COPD Mother   . Alcoholism Brother     SOCIAL HISTORY: Social History   Socioeconomic History  . Marital status: Married    Spouse name: Not on file  . Number of children: 2  . Years of education: Not on file  . Highest education level: High school graduate   Occupational History  . Not on file  Tobacco Use  . Smoking status: Current Every Day Smoker    Packs/day: 0.50    Years: 24.00    Pack years: 12.00    Types: Cigarettes  . Smokeless tobacco: Never Used  Vaping Use  . Vaping Use: Never used  Substance and Sexual Activity  . Alcohol use: No  . Drug use: No  . Sexual activity: Not on file  Other Topics Concern  . Not on file  Social History Narrative   Lives with spouse   Sweet tea, energy drinks, Diet Coke, work 12 hr shifts   Social Determinants of Health   Financial Resource Strain:   . Difficulty of Paying Living Expenses: Not on file  Food Insecurity:   . Worried About Charity fundraiser in the Last Year: Not on file  . Ran Out of Food in the Last Year: Not on file  Transportation Needs:   . Lack of Transportation (Medical): Not on file  . Lack of Transportation (Non-Medical): Not on file  Physical Activity:   . Days of Exercise per Week: Not on file  . Minutes of Exercise per Session: Not on file  Stress:   . Feeling of Stress : Not on file  Social Connections:   . Frequency of Communication with Friends and Family: Not on file  . Frequency of Social Gatherings with Friends and Family: Not on file  . Attends Religious Services: Not on file  . Active Member of Clubs or Organizations: Not on file  . Attends Archivist Meetings: Not on file  . Marital Status: Not on file  Intimate Partner Violence:   . Fear of Current or Ex-Partner: Not on file  . Emotionally Abused: Not on file  . Physically Abused: Not on file  . Sexually Abused: Not on file     PHYSICAL EXAM  GENERAL EXAM/CONSTITUTIONAL: Vitals:  Vitals:   10/12/20 1036  BP: 134/79  Pulse: 87  Weight: 228 lb (103.4 kg)  Height: 6\' 2"  (1.88 m)     Body mass index is 29.27 kg/m. Wt Readings from Last 3 Encounters:  10/12/20 228 lb (103.4 kg)  05/22/20 233 lb (105.7 kg)  04/13/20 234 lb (106.1 kg)     Patient is in no distress;  well developed, nourished and groomed; neck is supple  CARDIOVASCULAR:  Examination of carotid arteries is normal; no carotid bruits  Regular rate and rhythm, no murmurs  Examination of peripheral vascular system by observation and palpation is normal  EYES:  Ophthalmoscopic exam of optic discs and posterior segments is normal; no papilledema or hemorrhages  No exam data present  MUSCULOSKELETAL:  Gait, strength, tone, movements noted in Neurologic exam below  NEUROLOGIC: MENTAL STATUS:  No  flowsheet data found.  awake, alert, oriented to person, place and time  recent and remote memory intact  normal attention and concentration  language fluent, comprehension intact, naming intact  fund of knowledge appropriate  CRANIAL NERVE:   2nd - no papilledema on fundoscopic exam  2nd, 3rd, 4th, 6th - pupils equal and reactive to light, visual fields full to confrontation, extraocular muscles --> LIMITED LATERAL RECTUS STRENGTH ON RIGHT AND LEFT GAZE TESTING; CONTRALATERAL NYSTAGMUS ON LATERAL GAZE  5th - facial sensation symmetric  7th - facial strength symmetric  8th - hearing intact  9th - palate elevates symmetrically, uvula midline  11th - shoulder shrug symmetric  12th - tongue protrusion midline  MOTOR:   normal bulk and tone  BUE 4+  BLE HIP FLEX 3, KNEE FLEX 3, KNEE EXT 4, DF 3  HIGH ARCHES  SENSORY:   normal and symmetric to light touch, pinprick, temperature, vibration; EXCEPT DECR IN GLOVE / STOCKING DISTRIBUTION  COORDINATION:   finger-nose-finger, fine finger movements normal  REFLEXES:   deep tendon reflexes --> UPPER EXT TRACE; ABSENT IN BLE  GAIT/STATION:   STEPPAGE GAIT; SLOW AND CAUTIOUS     DIAGNOSTIC DATA (LABS, IMAGING, TESTING) - I reviewed patient records, labs, notes, testing and imaging myself where available.  Lab Results  Component Value Date   WBC 9.2 04/13/2020   HGB 16.4 04/13/2020   HCT 47.0 04/13/2020    MCV 83.2 04/13/2020   PLT 118.0 (L) 04/13/2020      Component Value Date/Time   NA 137 04/13/2020 1602   K 3.9 04/13/2020 1602   CL 104 04/13/2020 1602   CO2 28 04/13/2020 1602   GLUCOSE 103 (H) 04/13/2020 1602   BUN 9 04/13/2020 1602   CREATININE 0.72 04/13/2020 1602   CALCIUM 9.9 04/13/2020 1602   PROT 7.4 04/13/2020 1602   ALBUMIN 4.9 04/13/2020 1602   AST 36 04/13/2020 1602   ALT 65 (H) 04/13/2020 1602   ALKPHOS 76 04/13/2020 1602   BILITOT 0.6 04/13/2020 1602   GFRNONAA >60 09/14/2019 0408   GFRAA >60 09/14/2019 0408   Lab Results  Component Value Date   CHOL 164 04/13/2020   HDL 29.40 (L) 04/13/2020   LDLCALC 109 (H) 04/13/2020   LDLDIRECT 118.0 04/13/2020   TRIG 127.0 04/13/2020   CHOLHDL 6 04/13/2020   Lab Results  Component Value Date   HGBA1C 5.3 04/13/2020   No results found for: [id number] Lab Results  Component Value Date   TSH 2.51 04/13/2020       ASSESSMENT AND PLAN  49 y.o. year old male here with new onset of lower extremity weakness and balance difficulty.  Neurologic exam notable for eye movement abnormalities, mild upper extremity weakness, significant lower extremity weakness, with lower motor neuron signs, sensory deficits, hyporeflexia most likely related to peripheral nervous system process such as polyneuropathy.  We will proceed with further work-up.   DDx: cranial neuropathy + polyneuropathy (? AIDP, CIDP, miller fisher variant; also rule out CNS demyelinating disease)  1. Weakness   2. Numbness   3. Ophthalmoplegia     PLAN:  - check labs, MRI, EMG testing - consider PT eval; cane / walker if needed  Orders Placed This Encounter  Procedures  . MR BRAIN W WO CONTRAST  . MR CERVICAL SPINE W WO CONTRAST  . Vitamin B12  . SPEP with IFE  . ANA w/Reflex  . SSA, SSB  . HIV  . RPR  . Hepatitis B surface  antibody, qualitative  . Hepatitis B surface antigen  . Hepatitis C antibody  . Hepatitis B core antibody, total  .  Vitamin B1  . Vitamin B6  . ANCA  . GM1 IgG Autoantibodies  . NCV with EMG(electromyography)   Return for for NCV/EMG.    Penni Bombard, MD 41/29/0475, 33:91 AM Certified in Neurology, Neurophysiology and Neuroimaging  [facility] [address] [place], [place] [address] [phone]

## 2020-10-16 ENCOUNTER — Telehealth: Payer: Self-pay | Admitting: Diagnostic Neuroimaging

## 2020-10-16 ENCOUNTER — Telehealth: Payer: Self-pay

## 2020-10-16 NOTE — Telephone Encounter (Signed)
Patient returned my call he is scheduled at [facility] for 10/30/20.

## 2020-10-16 NOTE — Telephone Encounter (Signed)
Spoke with patient and answered his questions regarding MRI scans. Advised him of Dr Gladstone Lighter plan per office note. He asked about labs results.  I advised of normal results that are in, but most are still pending. Patient stated satisfaction with answers, verbalized understanding, appreciation.

## 2020-10-16 NOTE — Telephone Encounter (Signed)
LVM for pt to call back to schedule MRI  [facility] um[facility] auth: [place] Ref # [id number]

## 2020-10-16 NOTE — Telephone Encounter (Signed)
Patient would like a call back, he has some questions as to the testing he is scheduled for. Thanks

## 2020-10-18 LAB — MULTIPLE MYELOMA PANEL, SERUM
Albumin SerPl Elph-Mcnc: 4.4 g/dL (ref 2.9–4.4)
Albumin/Glob SerPl: 1.4 (ref 0.7–1.7)
Alpha 1: 0.2 g/dL (ref 0.0–0.4)
Alpha2 Glob SerPl Elph-Mcnc: 0.7 g/dL (ref 0.4–1.0)
B-Globulin SerPl Elph-Mcnc: 1.2 g/dL (ref 0.7–1.3)
Gamma Glob SerPl Elph-Mcnc: 1.1 g/dL (ref 0.4–1.8)
Globulin, Total: 3.2 g/dL (ref 2.2–3.9)
IgG (Immunoglobin G), Serum: 957 mg/dL (ref 603–1613)
IgM (Immunoglobulin M), Srm: 169 mg/dL (ref 20–172)
Immunoglobulin A, (IgA) QN, Serum: 195 mg/dL (ref 90–386)
Total Protein: 7.6 g/dL (ref 6.0–8.5)

## 2020-10-18 LAB — RPR: RPR Ser Ql: NONREACTIVE

## 2020-10-18 LAB — GM1 IGG AUTOANTIBODIES: GM1 IgG Autoantibodies: <20 % (ref 0–30)

## 2020-10-18 LAB — PAN-ANCA
ANCA Proteinase 3: <3.5 U/mL (ref 0.0–3.5)
Atypical pANCA: <1:20 {titer}
C-ANCA: <1:20 {titer}
Myeloperoxidase Ab: <9 U/mL (ref 0.0–9.0)
P-ANCA: <1:20 {titer}

## 2020-10-18 LAB — VITAMIN B1: Thiamine: 141.1 nmol/L (ref 66.5–200.0)

## 2020-10-18 LAB — HEPATITIS B SURFACE ANTIGEN: Hepatitis B Surface Ag: NEGATIVE

## 2020-10-18 LAB — VITAMIN B6: Vitamin B6: 17.7 ug/L (ref 5.3–46.7)

## 2020-10-18 LAB — HEPATITIS B SURFACE ANTIBODY,QUALITATIVE: Hep B Surface Ab, Qual: REACTIVE

## 2020-10-18 LAB — INTERPRETATION

## 2020-10-18 LAB — HIV ANTIBODY (ROUTINE TESTING W REFLEX): HIV Screen 4th Generation wRfx: NONREACTIVE

## 2020-10-18 LAB — SJOGREN'S SYNDROME ANTIBODS(SSA + SSB)
ENA SSA (RO) Ab: <0.2 AI (ref 0.0–0.9)
ENA SSB (LA) Ab: <0.2 AI (ref 0.0–0.9)

## 2020-10-18 LAB — HEPATITIS C ANTIBODY: Hep C Virus Ab: 0.1 s/co ratio (ref 0.0–0.9)

## 2020-10-18 LAB — ANA W/REFLEX: ANA Titer 1: NEGATIVE

## 2020-10-18 LAB — HEPATITIS B CORE ANTIBODY, TOTAL: Hep B Core Total Ab: NEGATIVE

## 2020-10-18 LAB — VITAMIN B12: Vitamin B-12: 572 pg/mL (ref 232–1245)

## 2020-10-24 MED FILL — PARoxetine HCL 10 MG TABS: 10 | 30 days supply | Qty: 30 | Fill #1

## 2020-10-24 NOTE — Telephone Encounter (Signed)
Called patient to schedule follow up appointment no answer LM to call back and schedule.  °

## 2020-10-30 ENCOUNTER — Ambulatory Visit: Payer: 59

## 2020-10-30 DIAGNOSIS — Z1152 Encounter for screening for COVID-19: Secondary | ICD-10-CM | POA: Diagnosis not present

## 2020-10-30 DIAGNOSIS — H499 Unspecified paralytic strabismus: Secondary | ICD-10-CM

## 2020-10-30 DIAGNOSIS — R051 Acute cough: Secondary | ICD-10-CM | POA: Diagnosis not present

## 2020-10-30 DIAGNOSIS — R200 Anesthesia of skin: Secondary | ICD-10-CM

## 2020-10-30 DIAGNOSIS — J1089 Influenza due to other identified influenza virus with other manifestations: Secondary | ICD-10-CM | POA: Diagnosis not present

## 2020-10-30 DIAGNOSIS — R531 Weakness: Secondary | ICD-10-CM

## 2020-10-31 DIAGNOSIS — J111 Influenza due to unidentified influenza virus with other respiratory manifestations: Secondary | ICD-10-CM | POA: Diagnosis not present

## 2020-10-31 DIAGNOSIS — R531 Weakness: Secondary | ICD-10-CM | POA: Diagnosis not present

## 2020-10-31 DIAGNOSIS — B9689 Other specified bacterial agents as the cause of diseases classified elsewhere: Secondary | ICD-10-CM | POA: Diagnosis not present

## 2020-10-31 DIAGNOSIS — R52 Pain, unspecified: Secondary | ICD-10-CM | POA: Diagnosis not present

## 2020-10-31 DIAGNOSIS — J069 Acute upper respiratory infection, unspecified: Secondary | ICD-10-CM | POA: Diagnosis not present

## 2020-10-31 NOTE — Telephone Encounter (Signed)
Patient was unable to have the MRI at [facility] yesterday 10/30/20 he was too claustrophobic. I left him a voicemail informing him that I will fax the order to [facility] that way he can have the open mri.

## 2020-11-01 ENCOUNTER — Telehealth: Payer: Self-pay | Admitting: Family Medicine

## 2020-11-01 NOTE — Telephone Encounter (Signed)
Received message from after hours triage  line that patient needed to be seen for sore throat, runny nose, sneezing, coughing and chest hurts when coughing. Called patient twice and left detailed message to give the office a call back.

## 2020-11-06 NOTE — Telephone Encounter (Signed)
Patient was schedule at [facility] for his MRI the tech's informed me he was unable to go through with it because he was clauso. I faxed the orders to [facility] for the open machine. I left a voicemail informing the patient of this and that he also paid the $75.00 and per Angie if he does not have any kind of balance with [place] she will refund the money back to him.

## 2020-11-07 NOTE — Telephone Encounter (Signed)
Patient called back and stated he is not clauso and wanted to reschedule here at [facility] patient is scheduled at [facility] for 11/13/20.

## 2020-11-08 ENCOUNTER — Encounter: Payer: 59 | Admitting: Diagnostic Neuroimaging

## 2020-11-08 ENCOUNTER — Ambulatory Visit (INDEPENDENT_AMBULATORY_CARE_PROVIDER_SITE_OTHER): Payer: 59 | Admitting: Diagnostic Neuroimaging

## 2020-11-08 DIAGNOSIS — R531 Weakness: Secondary | ICD-10-CM

## 2020-11-08 DIAGNOSIS — R200 Anesthesia of skin: Secondary | ICD-10-CM

## 2020-11-08 DIAGNOSIS — Z0289 Encounter for other administrative examinations: Secondary | ICD-10-CM

## 2020-11-08 DIAGNOSIS — H499 Unspecified paralytic strabismus: Secondary | ICD-10-CM

## 2020-11-08 NOTE — Procedures (Signed)
[facility]  NCS (NERVE CONDUCTION STUDY) WITH EMG (ELECTROMYOGRAPHY) REPORT   STUDY DATE: 11/08/20 PATIENT NAME: Alan Sanders DOB: 04-Nov-1971 MRN: [id number]  ORDERING CLINICIAN: Andrey Spearman, MD   TECHNOLOGIST: Sherre Scarlet ELECTROMYOGRAPHER: Earlean Polka. Kesley Gaffey, MD  CLINICAL INFORMATION: 49 year old male with lower extremity numbness and weakness  FINDINGS: NERVE CONDUCTION STUDY:  Left peroneal and tibial motor responses cannot be obtained.  Left median and left ulnar motor responses are normal.  Left sural and superficial peroneal sensory responses could not be obtained.  Left ulnar sensory response is normal.  Left median sensory response has decreased amplitude and prolonged peak latency.   NEEDLE ELECTROMYOGRAPHY:  Needle examination of left upper and lower extremities notable for abnormal spontaneous activity in left flexor carpi radialis, left biceps brachii, left vastus medialis.  Reduced motor unit motor unit recruitment noted in left vastus medialis, left tibialis anterior, left gastrocnemius and left flexor carpi radialis.  Early motor unit recruitment noted in left deltoid.    IMPRESSION:   Abnormal study demonstrating: - Severe axonal sensorimotor polyneuropathy.    INTERPRETING PHYSICIAN:  Penni Bombard, MD Certified in Neurology, Neurophysiology and Neuroimaging  [facility] [address], [place], [place] [address] [phone]   [facility]    Nerve / Sites Muscle Latency Ref. Amplitude Ref. Rel Amp Segments Distance Velocity Ref. Area    ms ms mV mV %  cm m/s m/s mVms  L Median - APB     Wrist APB 3.7 ?4.4 10.5 ?4.0 100 Wrist - APB 7   24.2     Upper arm APB 8.5  5.8  55 Upper arm - Wrist 24 50 ?49 13.0  L Ulnar - ADM     Wrist ADM 2.7 ?3.3 10.2 ?6.0 100 Wrist - ADM 7   29.9     B.Elbow ADM 7.1  10.0  97.4 B.Elbow - Wrist 22 49 ?49 26.5     A.Elbow ADM 9.2  9.5  95.1 A.Elbow - B.Elbow  10 49 ?49 27.6         A.Elbow - Wrist      L Peroneal - EDB     Ankle EDB NR ?6.5 NR ?2.0 NR Ankle - EDB 9   NR     Fib head EDB NR  NR  NR Fib head - Ankle 30 NR ?44 NR     Pop fossa EDB NR  NR  NR Pop fossa - Fib head 10 NR ?44 NR         Pop fossa - Ankle      L Tibial - AH     Ankle AH NR ?5.8 NR ?4.0 NR Ankle - AH 9   NR     Pop fossa AH NR  NR  NR Pop fossa - Ankle 42 NR ?41 NR             SNC    Nerve / Sites Rec. Site Peak Lat Ref.  Amp Ref. Segments Distance Peak Diff Ref.    ms ms V V  cm ms ms  L Sural - Ankle (Calf)     Calf Ankle NR ?4.4 NR ?6 Calf - Ankle 14    L Superficial peroneal - Ankle     Lat leg Ankle NR ?4.4 NR ?6 Lat leg - Ankle 14    L Median, Ulnar - Transcarpal comparison     Median Palm Wrist 2.8 ?2.2 22 ?35 Median Palm - Wrist  8       Ulnar Palm Wrist 2.4 ?2.2 20 ?12 Ulnar Palm - Wrist 8          Median Palm - Ulnar Palm  0.4 ?0.4  L Median - Orthodromic (Dig II, Mid palm)     Dig II Wrist 3.9 ?3.4 7 ?10 Dig II - Wrist 13    L Ulnar - Orthodromic, (Dig V, Mid palm)     Dig V Wrist 2.7 ?3.1 5 ?5 Dig V - Wrist 65                 F  Wave    Nerve F Lat Ref.   ms ms  L Ulnar - ADM 34.8 ?32.0       EMG Summary Table    Spontaneous MUAP Recruitment  Muscle IA Fib PSW Fasc Other Amp Dur. Poly Pattern  L. Vastus medialis Normal 1+ 1+ None _______ Increased Normal Normal Reduced  L. Tibialis anterior Normal None None None _______ Increased Normal Normal Reduced  L. Gastrocnemius (Medial head) Decreased None None None _______ Normal Normal Normal Reduced  L. Deltoid Normal None None None _______ Normal Normal Normal Early  L. Biceps brachii Normal 1+ 1+ None _______ Normal Normal Normal Normal  L. Triceps brachii Normal None None None _______ Normal Normal Normal Normal  L. Flexor carpi radialis Normal 1+ 1+ None _______ Increased Normal Normal Reduced  L. First dorsal interosseous Normal None None None _______ Normal Normal Normal Normal

## 2020-11-13 ENCOUNTER — Other Ambulatory Visit: Payer: Self-pay

## 2020-11-13 ENCOUNTER — Ambulatory Visit (INDEPENDENT_AMBULATORY_CARE_PROVIDER_SITE_OTHER): Payer: 59

## 2020-11-13 DIAGNOSIS — H499 Unspecified paralytic strabismus: Secondary | ICD-10-CM | POA: Diagnosis not present

## 2020-11-13 DIAGNOSIS — R531 Weakness: Secondary | ICD-10-CM | POA: Diagnosis not present

## 2020-11-13 DIAGNOSIS — R200 Anesthesia of skin: Secondary | ICD-10-CM | POA: Diagnosis not present

## 2020-11-13 MED ORDER — GADOBENATE DIMEGLUMINE 529 MG/ML IV SOLN
20.0000 mL | Freq: Once | INTRAVENOUS | Status: AC | PRN
  Administered 2020-11-13: 20 mL via INTRAVENOUS

## 2020-11-14 ENCOUNTER — Telehealth: Payer: Self-pay | Admitting: Neurology

## 2020-11-14 DIAGNOSIS — G959 Disease of spinal cord, unspecified: Secondary | ICD-10-CM

## 2020-11-14 NOTE — Telephone Encounter (Addendum)
I called the patient.  Left a message, I will call back later.  MRI of the brain was relatively unremarkable but the cervical MRI does show evidence of spinal stenosis with evidence of spinal cord compression, would recommend neurosurgical evaluation.   I called the patient, I discussed the MRI results with him, I will get neurosurgical referral set up for him, he is amenable to this.   MRI cervical 11/14/20:  IMPRESSION: This MRI of the cervical spine with and without contrast shows the following: 1.   At C3-C4, there is moderately severe spinal stenosis (AP diameter 6.3 mm) due to degenerative changes and congenitally short pedicles.  Just below the point of maximal stenosis there is increased signal within the spinal cord consistent with compressive myelopathy.  Additionally at this level there is moderately severe foraminal narrowing that could lead to C4 nerve root compression to either side. 2.  There is mild spinal stenosis at C4-C5, moderate spinal stenosis at C5-C6 and mild spinal stenosis at C6-C7 due to degenerative changes and congenitally short pedicles.  Adjacent spinal cord has normal signal at these levels and there does not appear to be nerve root compression. 3.   After the infusion of contrast, a normal enhancement pattern is observed. 4.   3 to 4 mm of cerebellar tonsillar ectopia.  This is not severe enough to be considered a Chiari malformation.

## 2020-11-14 NOTE — Telephone Encounter (Signed)
     MRI brain 11/14/20:  IMPRESSION:   This MRI of the brain with and without contrast shows the following:.    1.   3 to 4 mm cerebellar tonsillar ectopia.  This is not enough to be considered a Chiari malformation. 2.   Brain parenchyma is normal. 3.   No acute findings.  Normal enhancement pattern.

## 2020-11-22 ENCOUNTER — Other Ambulatory Visit (HOSPITAL_COMMUNITY): Payer: Self-pay | Admitting: Neurological Surgery

## 2020-11-22 ENCOUNTER — Other Ambulatory Visit: Payer: Self-pay | Admitting: Neurological Surgery

## 2020-11-22 DIAGNOSIS — R29898 Other symptoms and signs involving the musculoskeletal system: Secondary | ICD-10-CM | POA: Diagnosis not present

## 2020-11-22 DIAGNOSIS — M47812 Spondylosis without myelopathy or radiculopathy, cervical region: Secondary | ICD-10-CM

## 2020-11-22 DIAGNOSIS — R200 Anesthesia of skin: Secondary | ICD-10-CM | POA: Diagnosis not present

## 2020-11-22 DIAGNOSIS — Z6829 Body mass index (BMI) 29.0-29.9, adult: Secondary | ICD-10-CM | POA: Diagnosis not present

## 2020-11-22 MED FILL — GABAPENTIN 300 MG CAPSULE: 300 | 30 days supply | Qty: 90 | Fill #0

## 2020-11-27 ENCOUNTER — Other Ambulatory Visit: Payer: Self-pay

## 2020-11-27 ENCOUNTER — Ambulatory Visit (HOSPITAL_COMMUNITY)
Admission: RE | Admit: 2020-11-27 | Discharge: 2020-11-27 | Disposition: A | Discharge status: Home / Self Care | Payer: 59 | Source: Ambulatory Visit | Attending: Neurological Surgery | Admitting: Neurological Surgery

## 2020-11-27 DIAGNOSIS — M47812 Spondylosis without myelopathy or radiculopathy, cervical region: Secondary | ICD-10-CM | POA: Diagnosis not present

## 2020-11-27 DIAGNOSIS — M5124 Other intervertebral disc displacement, thoracic region: Secondary | ICD-10-CM | POA: Diagnosis not present

## 2020-11-27 DIAGNOSIS — M47814 Spondylosis without myelopathy or radiculopathy, thoracic region: Secondary | ICD-10-CM | POA: Diagnosis not present

## 2020-11-27 DIAGNOSIS — R29898 Other symptoms and signs involving the musculoskeletal system: Secondary | ICD-10-CM | POA: Insufficient documentation

## 2020-11-27 DIAGNOSIS — M542 Cervicalgia: Secondary | ICD-10-CM | POA: Diagnosis not present

## 2020-11-27 DIAGNOSIS — Z20822 Contact with and (suspected) exposure to covid-19: Secondary | ICD-10-CM | POA: Diagnosis not present

## 2020-11-27 DIAGNOSIS — R531 Weakness: Secondary | ICD-10-CM | POA: Diagnosis not present

## 2020-11-27 DIAGNOSIS — R197 Diarrhea, unspecified: Secondary | ICD-10-CM | POA: Diagnosis not present

## 2020-11-27 DIAGNOSIS — J209 Acute bronchitis, unspecified: Secondary | ICD-10-CM | POA: Diagnosis not present

## 2020-11-27 IMAGING — MR MR THORACIC SPINE W/O CM
7 of 8 series · 36 of 48 positions shown · non-contrast
Comparison: None.

CLINICAL DATA: Bilateral leg weakness.

EXAM:
MRI THORACIC SPINE WITHOUT CONTRAST
TECHNIQUE: Multiplanar, multisequence MR imaging of the thoracic spine was
performed. No intravenous contrast was administered.

[Series 16: T1 · sagittal · 4.0mm · 1.72mm/px · 4 of 15 slices shown (1 of 3)]
[im 1/15]
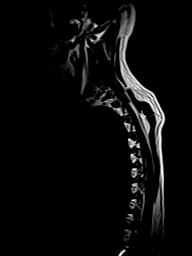
[im 5/15]
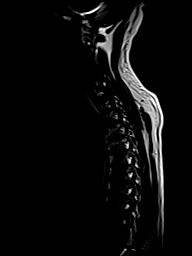
[im 10/15]
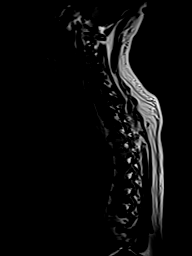
[im 15/15]
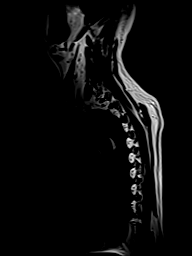

[Series 17: T2 · sagittal · 3.0mm · 0.86mm/px · 4 of 17 slices shown (1 of 2)]
[im 1/17]
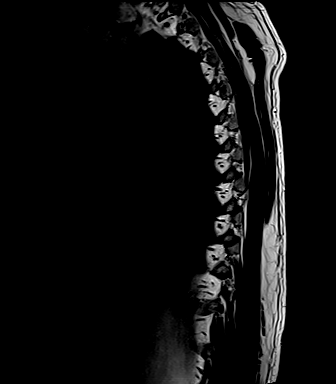
[im 6/17]
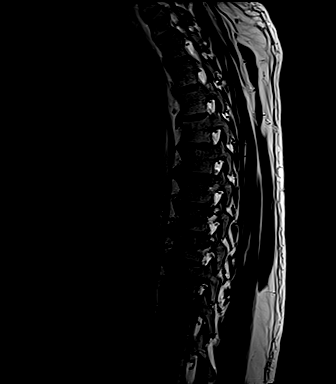
[im 11/17]
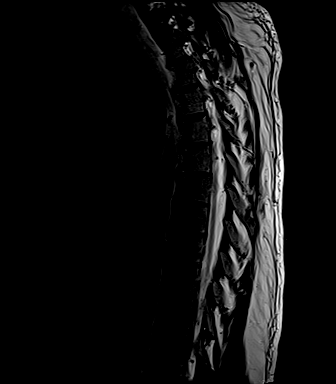
[im 17/17]
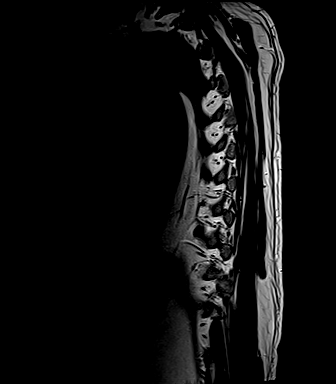

[Series 18: STIR · sagittal · 3.0mm · 1.03mm/px · 4 of 17 slices shown]
[im 1/17]
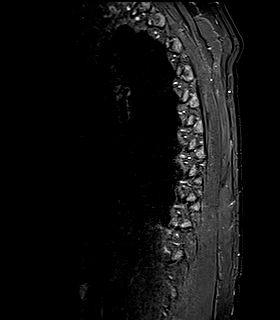
[im 6/17]
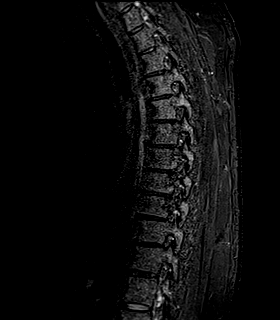
[im 11/17]
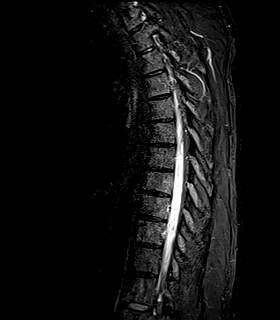
[im 17/17]
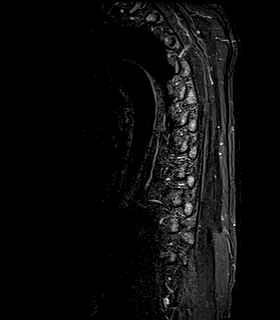

[Series 19: T1 · sagittal · 3.0mm · 1.03mm/px · 4 of 17 slices shown (2 of 3)]
[im 1/17]
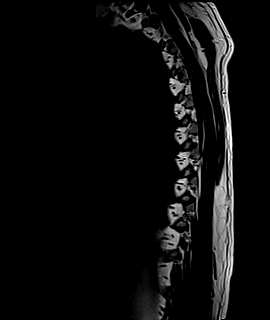
[im 6/17]
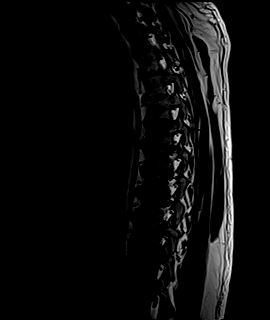
[im 11/17]
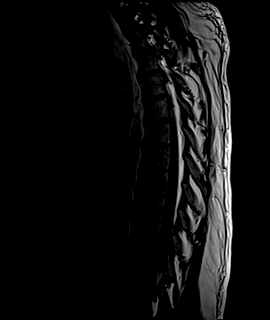
[im 17/17]
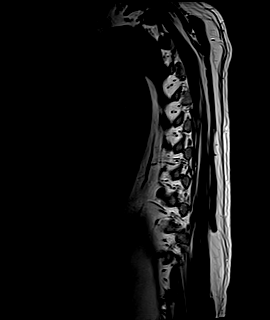

[Series 20: T2 · axial · 4.0mm · 0.78mm/px · z∈[-242,+23]mm · 9 of 39 slices shown (2 of 2)]
[im 1/39]
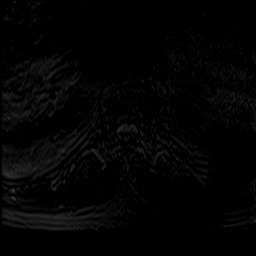
[im 5/39]
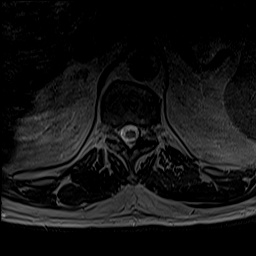
[im 10/39]
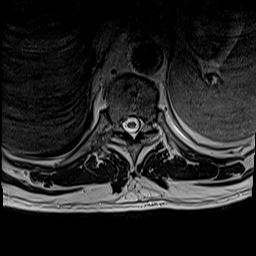
[im 15/39]
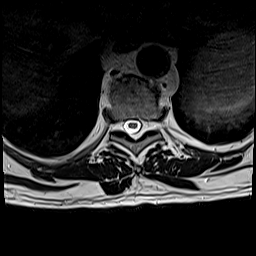
[im 20/39]
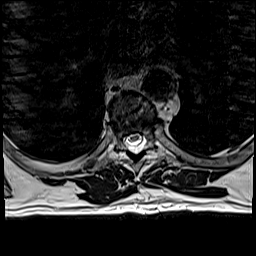
[im 24/39]
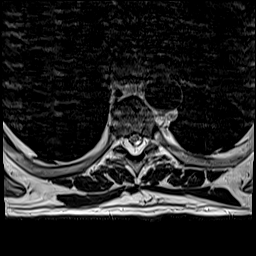
[im 29/39]
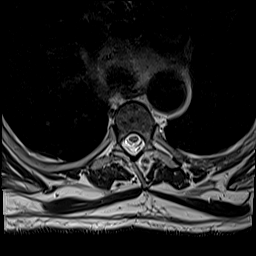
[im 34/39]
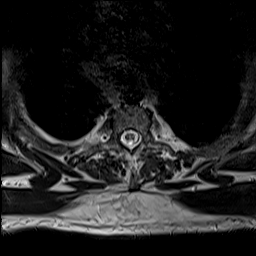
[im 39/39]
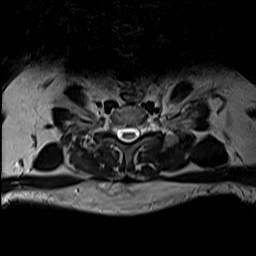

[Series 21: t2_me2d_tra · axial · 4.0mm · 0.39mm/px · z∈[-242,-151]mm · 3 of 39 slices shown]
[im 1/39]
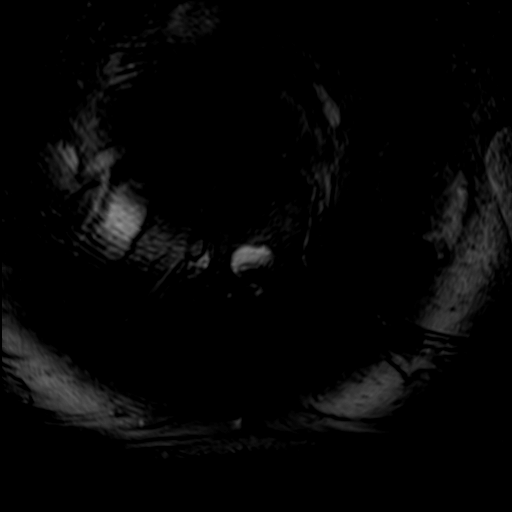
[im 5/39]
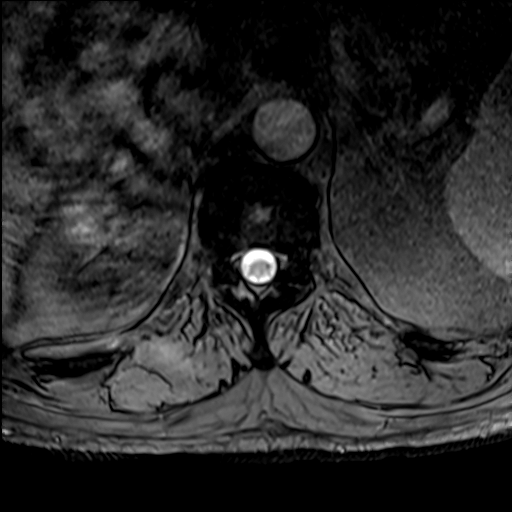
[im 10/39]
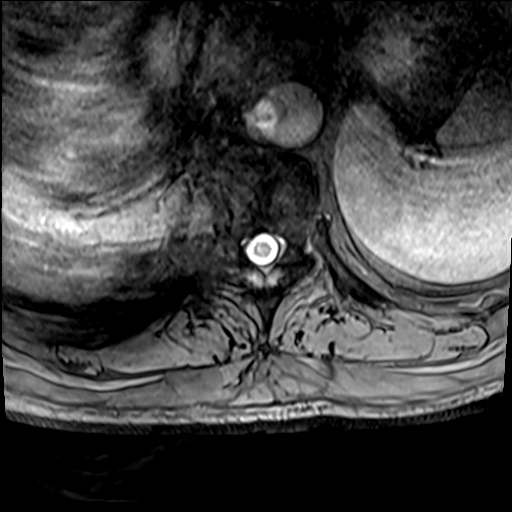

[Series 22: T1 · axial · 4.0mm · 0.39mm/px · z∈[-242,+23]mm · 8 of 39 slices shown (3 of 3)]
[im 1/39]
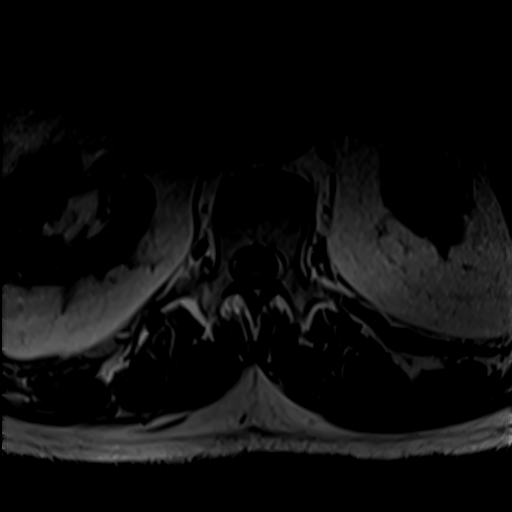
[im 5/39]
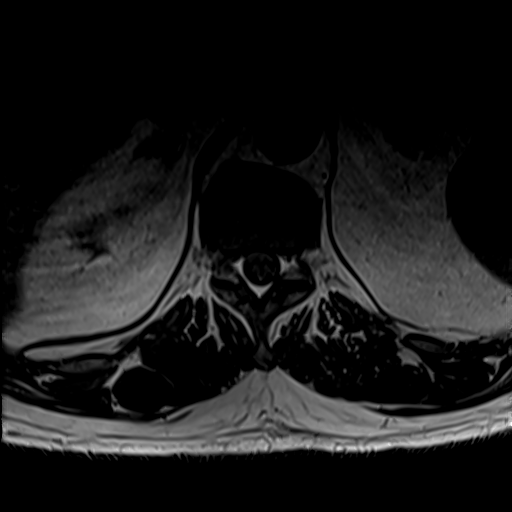
[im 10/39]
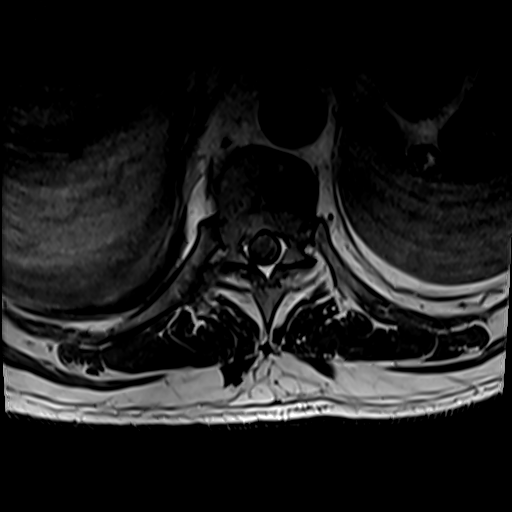
[im 15/39]
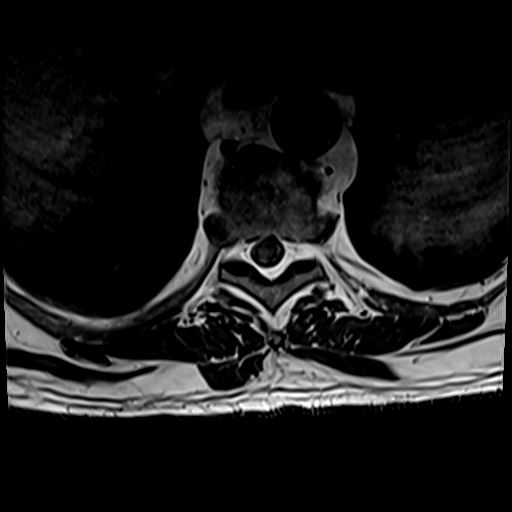
[im 24/39]
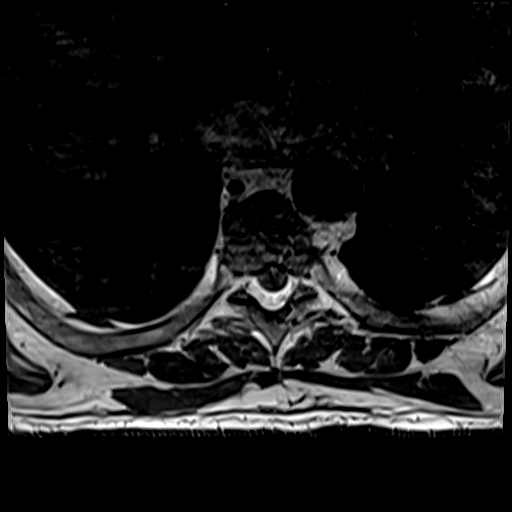
[im 29/39]
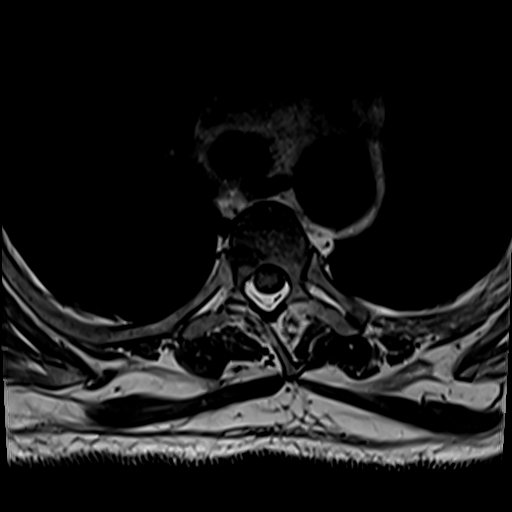
[im 34/39]
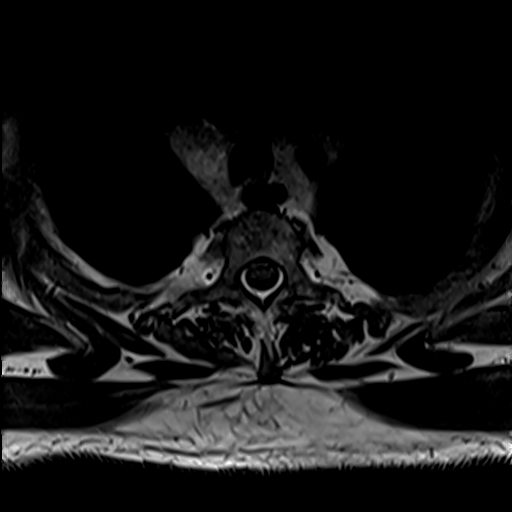
[im 39/39]
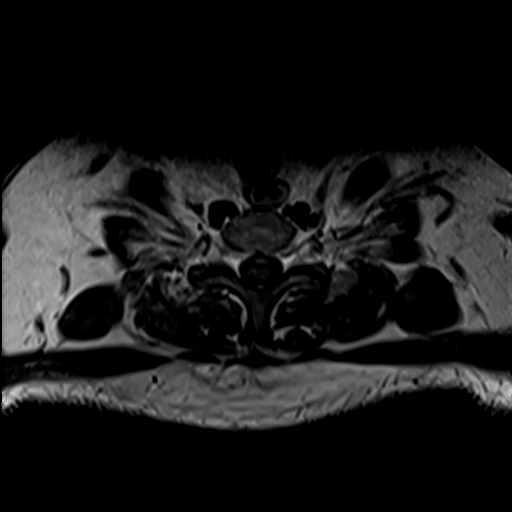

[36 of 48 positions shown; findings below may reference images not displayed]

FINDINGS: Alignment:  Normal

Vertebrae: Normal bone marrow.  Negative for fracture or mass.

Cord:  Normal signal and morphology.  No cord lesion.

Paraspinal and other soft tissues: Negative for paraspinous mass.

Disc levels:

Mild thoracic disc degeneration without significant spinal stenosis
or cord compression.

T5-6: Mild disc degeneration and spurring without stenosis

T6-7: Small right-sided disc protrusion and spurring with cord
flattening on the right. No significant stenosis

T7-8: Schmorl's node.  Negative for disc protrusion or stenosis

T8-9: Shallow central disc protrusion without stenosis

T9-10: Small right-sided disc protrusion without spinal or foraminal
stenosis

T10-11: Mild disc and mild facet degeneration

T11-12: Schmorl's node.  Negative for disc protrusion.
IMPRESSION: Mild thoracic spine degenerative changes. No significant neural
impingement. No cord lesion identified.

## 2020-11-28 DIAGNOSIS — R200 Anesthesia of skin: Secondary | ICD-10-CM | POA: Diagnosis not present

## 2020-11-28 DIAGNOSIS — R030 Elevated blood-pressure reading, without diagnosis of hypertension: Secondary | ICD-10-CM | POA: Diagnosis not present

## 2020-11-28 DIAGNOSIS — G608 Other hereditary and idiopathic neuropathies: Secondary | ICD-10-CM | POA: Diagnosis not present

## 2020-11-28 DIAGNOSIS — M47812 Spondylosis without myelopathy or radiculopathy, cervical region: Secondary | ICD-10-CM | POA: Diagnosis not present

## 2020-11-28 DIAGNOSIS — Z6829 Body mass index (BMI) 29.0-29.9, adult: Secondary | ICD-10-CM | POA: Diagnosis not present

## 2020-11-30 ENCOUNTER — Other Ambulatory Visit: Payer: Self-pay | Admitting: Neurological Surgery

## 2020-12-07 ENCOUNTER — Other Ambulatory Visit: Payer: Self-pay

## 2020-12-07 ENCOUNTER — Encounter (HOSPITAL_COMMUNITY)
Admission: RE | Admit: 2020-12-07 | Discharge: 2020-12-07 | Disposition: A | Discharge status: Home / Self Care | Payer: 59 | Source: Ambulatory Visit | Attending: Neurological Surgery | Admitting: Neurological Surgery

## 2020-12-07 ENCOUNTER — Encounter (HOSPITAL_COMMUNITY): Payer: Self-pay

## 2020-12-07 ENCOUNTER — Other Ambulatory Visit (HOSPITAL_COMMUNITY)
Admission: RE | Admit: 2020-12-07 | Discharge: 2020-12-07 | Disposition: A | Discharge status: Home / Self Care | Payer: 59 | Source: Ambulatory Visit | Attending: Neurological Surgery | Admitting: Neurological Surgery

## 2020-12-07 DIAGNOSIS — Z79899 Other long term (current) drug therapy: Secondary | ICD-10-CM | POA: Insufficient documentation

## 2020-12-07 DIAGNOSIS — Z20822 Contact with and (suspected) exposure to covid-19: Secondary | ICD-10-CM | POA: Insufficient documentation

## 2020-12-07 DIAGNOSIS — M47814 Spondylosis without myelopathy or radiculopathy, thoracic region: Secondary | ICD-10-CM | POA: Diagnosis not present

## 2020-12-07 DIAGNOSIS — R161 Splenomegaly, not elsewhere classified: Secondary | ICD-10-CM | POA: Insufficient documentation

## 2020-12-07 DIAGNOSIS — K219 Gastro-esophageal reflux disease without esophagitis: Secondary | ICD-10-CM | POA: Diagnosis not present

## 2020-12-07 DIAGNOSIS — G629 Polyneuropathy, unspecified: Secondary | ICD-10-CM | POA: Insufficient documentation

## 2020-12-07 DIAGNOSIS — D696 Thrombocytopenia, unspecified: Secondary | ICD-10-CM | POA: Insufficient documentation

## 2020-12-07 DIAGNOSIS — Z01812 Encounter for preprocedural laboratory examination: Secondary | ICD-10-CM | POA: Insufficient documentation

## 2020-12-07 DIAGNOSIS — M4802 Spinal stenosis, cervical region: Secondary | ICD-10-CM | POA: Diagnosis not present

## 2020-12-07 DIAGNOSIS — Z791 Long term (current) use of non-steroidal anti-inflammatories (NSAID): Secondary | ICD-10-CM | POA: Diagnosis not present

## 2020-12-07 DIAGNOSIS — M47812 Spondylosis without myelopathy or radiculopathy, cervical region: Secondary | ICD-10-CM | POA: Diagnosis not present

## 2020-12-07 DIAGNOSIS — J45909 Unspecified asthma, uncomplicated: Secondary | ICD-10-CM | POA: Diagnosis not present

## 2020-12-07 HISTORY — DX: Pneumonia, unspecified organism: J18.9

## 2020-12-07 HISTORY — DX: Unilateral inguinal hernia, without obstruction or gangrene, not specified as recurrent: K40.90

## 2020-12-07 HISTORY — DX: Headache, unspecified: R51.9

## 2020-12-07 LAB — CBC WITH DIFFERENTIAL/PLATELET
Abs Immature Granulocytes: 0.04 10*3/uL (ref 0.00–0.07)
Basophils Absolute: 0.1 10*3/uL (ref 0.0–0.1)
Basophils Relative: 1 %
Eosinophils Absolute: 0.3 10*3/uL (ref 0.0–0.5)
Eosinophils Relative: 3 %
HCT: 50.4 % (ref 39.0–52.0)
Hemoglobin: 17.1 g/dL — ABNORMAL HIGH (ref 13.0–17.0)
Immature Granulocytes: 1 %
Lymphocytes Relative: 19 %
Lymphs Abs: 1.7 10*3/uL (ref 0.7–4.0)
MCH: 28.5 pg (ref 26.0–34.0)
MCHC: 33.9 g/dL (ref 30.0–36.0)
MCV: 83.9 fL (ref 80.0–100.0)
Monocytes Absolute: 0.6 10*3/uL (ref 0.1–1.0)
Monocytes Relative: 7 %
Neutro Abs: 6.2 10*3/uL (ref 1.7–7.7)
Neutrophils Relative %: 69 %
Platelets: 151 10*3/uL (ref 150–400)
RBC: 6.01 MIL/uL — ABNORMAL HIGH (ref 4.22–5.81)
RDW: 14.1 % (ref 11.5–15.5)
WBC: 8.9 10*3/uL (ref 4.0–10.5)
nRBC: 0 % (ref 0.0–0.2)

## 2020-12-07 LAB — BASIC METABOLIC PANEL
Anion gap: 10 (ref 5–15)
BUN: 8 mg/dL (ref 6–20)
CO2: 25 mmol/L (ref 22–32)
Calcium: 9.5 mg/dL (ref 8.9–10.3)
Chloride: 101 mmol/L (ref 98–111)
Creatinine, Ser: 0.77 mg/dL (ref 0.61–1.24)
GFR, Estimated: >60 mL/min (ref >60)
Glucose, Bld: 129 mg/dL — ABNORMAL HIGH (ref 70–99)
Potassium: 4 mmol/L (ref 3.5–5.1)
Sodium: 136 mmol/L (ref 135–145)

## 2020-12-07 LAB — PROTIME-INR
INR: 0.9 (ref 0.8–1.2)
Prothrombin Time: 12.2 seconds (ref 11.4–15.2)

## 2020-12-07 LAB — SURGICAL PCR SCREEN
MRSA, PCR: NEGATIVE
Staphylococcus aureus: NEGATIVE

## 2020-12-07 NOTE — Progress Notes (Signed)
Anesthesia Chart Review:  Case: [id number] Date/Time: 12/11/20 0715   Procedure: SURAL NERVE BIOPSY (Right ) - 3C   Anesthesia type: General   Pre-op diagnosis: MIXED SENSORY-MOTOR POLYNEUROPATHY   Location: [facility] OR ROOM 3 / [facility]   Surgeons: DawleyTheodoro Doing, DO      DISCUSSION: Patient is a 50 year old male scheduled for the above procedure.  History includes smoking, asthma, splenomegaly (mild on 05/10/18 CT), thrombocytopenia, GERD, gait/balance difficulty, migraines, seizure (~ 1990's), hernia repair, bilateral orchidopexy (08/22/01). He had moderately severe cervical stenosis C3-4 on 11/13/20 MRI.   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  Preoperative labs acceptable for OR. PLT count 151K. Normal EKG 09/14/19. Normal LVEF 2016. Reported previous stress test but ~ 10 years ago in [facility].  He denied chest pain, shortness of breath, cough, fever at PAT RN visit.  12/07/2020 presurgical COVID-19 test in process.  Anesthesia team to evaluate on the day of surgery.   VS: BP 136/86   Pulse 87   Temp 36.6 C (Oral)   Resp 19   Ht 6\' 2"  (1.88 m)   Wt 104.6 kg   SpO2 98%   BMI 29.59 kg/m    PROVIDERS: Libby Maw, MD is PCP. Established care 04/13/20.  Andrey Spearman, MD is neurologist. Kathrynn Ducking, MD referred patient to neurosurgery following recent MRI of the brain and c-spine.    LABS: Labs reviewed: Acceptable for surgery. A1c 5.3% 04/13/20. (all labs ordered are listed, but only abnormal results are displayed)  Labs Reviewed  BASIC METABOLIC PANEL - Abnormal; Notable for the following components:      Result Value   Glucose, Bld 129 (*)    All other components within normal limits  CBC WITH DIFFERENTIAL/PLATELET - Abnormal; Notable for the following components:   RBC 6.01 (*)    Hemoglobin 17.1 (*)    All other components within normal limits  SURGICAL PCR SCREEN  PROTIME-INR     IMAGES: MRI T-spine  11/27/20: IMPRESSION: Mild thoracic spine degenerative changes. No significant neural impingement. No cord lesion identified.  CT C-spine 11/27/20: IMPRESSION: 1. No evidence of acute fracture or traumatic malalignment. 2. Moderate bony foraminal stenosis on the left at C3-C4 and the right at C5-C6. At least moderate canal stenosis at C3-C4, which was better characterized on recent outside MRI of the cervical spine.  MRI C-spine 11/13/20: IMPRESSION: 1.   At C3-C4, there is moderately severe spinal stenosis (AP diameter 6.3 mm) due to degenerative changes and congenitally short pedicles.  Just below the point of maximal stenosis there is increased signal within the spinal cord consistent with compressive myelopathy.  Additionally at this level there is moderately severe foraminal narrowing that could lead to C4 nerve root compression to either side. 2.  There is mild spinal stenosis at C4-C5, moderate spinal stenosis at C5-C6 and mild spinal stenosis at C6-C7 due to degenerative changes and congenitally short pedicles.  Adjacent spinal cord has normal signal at these levels and there does not appear to be nerve root compression. 3.   After the infusion of contrast, a normal enhancement pattern is observed. 4.   3 to 4 mm of cerebellar tonsillar ectopia.  This is not severe enough to be considered a Chiari malformation.  MRI Aaron Edelman 11/13/20: IMPRESSION: 1.   3 to 4 mm cerebellar tonsillar ectopia.  This is not enough to be considered a Chiari malformation. 2.   Brain  parenchyma is normal. 3.   No acute findings.  Normal enhancement pattern.  CTA Head/neck 05/25/17: IMPRESSION: 1. Patent carotid and vertebral arteries. No dissection, aneurysm, or significant stenosis is identified. 2. Patent circle of Willis. No large vessel occlusion, aneurysm, or significant stenosis is identified.   EKG: 09/14/19: NSR   CV: Echo 02/14/15: Study Conclusions  - Left ventricle: The cavity size was  normal. Systolic function was  normal. The estimated ejection fraction was in the range of 60%  to 65%. Wall motion was normal; there were no regional wall  motion abnormalities. Left ventricular diastolic function  parameters were normal.  - Right ventricle: The cavity size was mildly dilated. Wall  thickness was normal.    Past Medical History:  Diagnosis Date  . Arthritis    " in my back "  . Asthma   . Balance problem   . Gait difficulty   . GERD (gastroesophageal reflux disease)   . Headache    migraines  . Inguinal hernia of left side without obstruction or gangrene   . Pneumonia   . Seizures ([place])    " its been along time ,since my last seizure "  . Spleen enlarged   . Thrombocytopenia ([place])   . Tobacco abuse     Past Surgical History:  Procedure Laterality Date  . HERNIA REPAIR     inguinal    MEDICATIONS: . acetaminophen (TYLENOL) 500 MG tablet  . famotidine (PEPCID) 20 MG tablet  . gabapentin (NEURONTIN) 300 MG capsule  . ibuprofen (ADVIL,MOTRIN) 200 MG tablet  . PARoxetine (PAXIL) 10 MG tablet   No current facility-administered medications for this encounter.    Myra Gianotti, PA-C Surgical Short Stay/Anesthesiology [facility] Phone [phone] [facility] Phone [phone] 12/07/2020 4:05 PM

## 2020-12-07 NOTE — Progress Notes (Signed)
PCP - Libby Maw, MD Cardiologist - Denies  PPM/ICD - Denies  Chest x-ray - N/A EKG - N/A Stress Test - Per pt ~ 10 years ago at [facility]. Records requested ECHO - 02/14/15 Cardiac Cath - Denies  Sleep Study - Denies   Patient denies being diabetic.  Blood Thinner Instructions: N/A Aspirin Instructions: N/A  ERAS Protcol - N/A PRE-SURGERY Ensure or G2- N/A  COVID TEST- 12/07/20   Anesthesia review: Yes, review ECHO and stress test record requested from [place].  Patient denies shortness of breath, fever, cough and chest pain at PAT appointment   All instructions explained to the patient, with a verbal understanding of the material. Patient agrees to go over the instructions while at home for a better understanding. Patient also instructed to self quarantine after being tested for COVID-19. The opportunity to ask questions was provided.

## 2020-12-07 NOTE — Anesthesia Preprocedure Evaluation (Addendum)
Anesthesia Evaluation  Patient identified by MRN, date of birth, ID band Patient awake    Reviewed: Allergy & Precautions, H&P , NPO status , Patient's Chart, lab work & pertinent test results  Airway Mallampati: II  TM Distance: >3 FB Neck ROM: Full    Dental no notable dental hx.    Pulmonary asthma , Current Smoker,    Pulmonary exam normal breath sounds clear to auscultation       Cardiovascular Normal cardiovascular exam Rhythm:Regular Rate:Normal     Neuro/Psych Seizures -, Well Controlled,  negative psych ROS   GI/Hepatic negative GI ROS, Neg liver ROS,   Endo/Other  negative endocrine ROS  Renal/GU negative Renal ROS  negative genitourinary   Musculoskeletal negative musculoskeletal ROS (+)   Abdominal   Peds negative pediatric ROS (+)  Hematology negative hematology ROS (+)   Anesthesia Other Findings   Reproductive/Obstetrics negative OB ROS                            Anesthesia Physical Anesthesia Plan  ASA: II  Anesthesia Plan: General   Post-op Pain Management:    Induction: Intravenous  PONV Risk Score and Plan: 2 and Ondansetron, Dexamethasone and Treatment may vary due to age or medical condition  Airway Management Planned: LMA  Additional Equipment:   Intra-op Plan:   Post-operative Plan: Extubation in OR  Informed Consent: I have reviewed the patients History and Physical, chart, labs and discussed the procedure including the risks, benefits and alternatives for the proposed anesthesia with the patient or authorized representative who has indicated his/her understanding and acceptance.     Dental advisory given  Plan Discussed with: CRNA and Surgeon  Anesthesia Plan Comments: (PAT note written by Myra Gianotti, PA-C. )       Anesthesia Quick Evaluation

## 2020-12-07 NOTE — Pre-Procedure Instructions (Signed)
Your procedure is scheduled on Tuesday 12/11/20, from 07:30 AM- 09:00 AM.  Report to [facility] Main Entrance "A" at 05:30 A.M., and check in at the Admitting office.  Call this number if you have problems the morning of surgery:  [phone]  Call [phone] if you have any questions prior to your surgery date Monday-Friday 8am-4pm    Remember:  Do not eat or drink after midnight the night before your surgery.    Take these medicines the morning of surgery with A SIP OF WATER: famotidine (PEPCID) gabapentin (NEURONTIN)  PARoxetine (PAXIL)  acetaminophen (TYLENOL)0 if needed    As of today, STOP taking any Aspirin (unless otherwise instructed by your surgeon) Aleve, Naproxen, Ibuprofen, Motrin, Advil, Goody's, BC's, all herbal medications, fish oil, and all vitamins.                      Do not wear jewelry.            Do not wear lotions, powders, colognes, or deodorant.            Men may shave face and neck.            Do not bring valuables to the hospital.            [facility] is not responsible for any belongings or valuables.  Do NOT Smoke (Tobacco/Vaping) or drink Alcohol 24 hours prior to your procedure. If you use a CPAP at night, you may bring all equipment for your overnight stay.   Contacts, glasses, dentures or bridgework may not be worn into surgery.      For patients admitted to the hospital, discharge time will be determined by your treatment team.   Patients discharged the day of surgery will not be allowed to drive home, and someone needs to stay with them for 24 hours.    Special instructions:   [place]- Preparing For Surgery  Before surgery, you can play an important role. Because skin is not sterile, your skin needs to be as free of germs as possible. You can reduce the number of germs on your skin by washing with CHG (chlorahexidine gluconate) Soap before surgery.  CHG is an antiseptic cleaner which kills germs and bonds with the skin to  continue killing germs even after washing.    Oral Hygiene is also important to reduce your risk of infection.  Remember - BRUSH YOUR TEETH THE MORNING OF SURGERY WITH YOUR REGULAR TOOTHPASTE  Please do not use if you have an allergy to CHG or antibacterial soaps. If your skin becomes reddened/irritated stop using the CHG.  Do not shave (including legs and underarms) for at least 48 hours prior to first CHG shower. It is OK to shave your face.  Please follow these instructions carefully.   1. Shower the NIGHT BEFORE SURGERY and the MORNING OF SURGERY with CHG Soap.   2. If you chose to wash your hair, wash your hair first as usual with your normal shampoo.  3. After you shampoo, rinse your hair and body thoroughly to remove the shampoo.  4. Use CHG as you would any other liquid soap. You can apply CHG directly to the skin and wash gently with a scrungie or a clean washcloth.   5. Apply the CHG Soap to your body ONLY FROM THE NECK DOWN.  Do not use on open wounds or open sores. Avoid contact with your eyes, ears, mouth and genitals (private parts).  Wash Face and genitals (private parts)  with your normal soap.   6. Wash thoroughly, paying special attention to the area where your surgery will be performed.  7. Thoroughly rinse your body with warm water from the neck down.  8. DO NOT shower/wash with your normal soap after using and rinsing off the CHG Soap.  9. Pat yourself dry with a CLEAN TOWEL.  10. Wear CLEAN PAJAMAS to bed the night before surgery  11. Place CLEAN SHEETS on your bed the night of your first shower and DO NOT SLEEP WITH PETS.   Day of Surgery: SHOWER. Wear Clean/Comfortable clothing the morning of surgery Do not apply any deodorants/lotions.   Remember to brush your teeth WITH YOUR REGULAR TOOTHPASTE.   Please read over the following fact sheets that you were given.

## 2020-12-08 LAB — SARS CORONAVIRUS 2 (TAT 6-24 HRS): SARS Coronavirus 2: NEGATIVE

## 2020-12-10 ENCOUNTER — Ambulatory Visit: Payer: 59 | Admitting: Family Medicine

## 2020-12-11 ENCOUNTER — Encounter (HOSPITAL_COMMUNITY): Payer: Self-pay | Admitting: Neurological Surgery

## 2020-12-11 ENCOUNTER — Other Ambulatory Visit: Payer: Self-pay

## 2020-12-11 ENCOUNTER — Encounter (HOSPITAL_COMMUNITY)
Admission: RE | Disposition: A | Discharge status: Home / Self Care | Payer: Self-pay | Source: Home / Self Care | Attending: Neurological Surgery

## 2020-12-11 ENCOUNTER — Emergency Department (HOSPITAL_COMMUNITY)
Admission: EM | Admit: 2020-12-11 | Discharge: 2020-12-12 | Disposition: A | Discharge status: Home / Self Care | Payer: 59 | Source: Home / Self Care

## 2020-12-11 ENCOUNTER — Ambulatory Visit (HOSPITAL_COMMUNITY)
Admission: RE | Admit: 2020-12-11 | Discharge: 2020-12-11 | Disposition: A | Discharge status: Home / Self Care | Payer: 59 | Attending: Neurological Surgery | Admitting: Neurological Surgery

## 2020-12-11 ENCOUNTER — Encounter (HOSPITAL_COMMUNITY): Payer: Self-pay | Admitting: *Deleted

## 2020-12-11 ENCOUNTER — Emergency Department (HOSPITAL_COMMUNITY): Payer: 59

## 2020-12-11 ENCOUNTER — Other Ambulatory Visit (HOSPITAL_COMMUNITY): Payer: Self-pay | Admitting: Neurological Surgery

## 2020-12-11 ENCOUNTER — Ambulatory Visit (HOSPITAL_COMMUNITY): Payer: 59 | Admitting: Physician Assistant

## 2020-12-11 ENCOUNTER — Ambulatory Visit (HOSPITAL_COMMUNITY): Payer: 59 | Admitting: Certified Registered"

## 2020-12-11 DIAGNOSIS — D696 Thrombocytopenia, unspecified: Secondary | ICD-10-CM | POA: Diagnosis not present

## 2020-12-11 DIAGNOSIS — R070 Pain in throat: Secondary | ICD-10-CM | POA: Insufficient documentation

## 2020-12-11 DIAGNOSIS — R1110 Vomiting, unspecified: Secondary | ICD-10-CM | POA: Insufficient documentation

## 2020-12-11 DIAGNOSIS — J45909 Unspecified asthma, uncomplicated: Secondary | ICD-10-CM | POA: Diagnosis not present

## 2020-12-11 DIAGNOSIS — R0789 Other chest pain: Secondary | ICD-10-CM | POA: Diagnosis not present

## 2020-12-11 DIAGNOSIS — Z79899 Other long term (current) drug therapy: Secondary | ICD-10-CM | POA: Diagnosis not present

## 2020-12-11 DIAGNOSIS — R079 Chest pain, unspecified: Secondary | ICD-10-CM | POA: Insufficient documentation

## 2020-12-11 DIAGNOSIS — F17210 Nicotine dependence, cigarettes, uncomplicated: Secondary | ICD-10-CM | POA: Diagnosis not present

## 2020-12-11 DIAGNOSIS — G608 Other hereditary and idiopathic neuropathies: Secondary | ICD-10-CM | POA: Diagnosis not present

## 2020-12-11 DIAGNOSIS — R270 Ataxia, unspecified: Secondary | ICD-10-CM | POA: Diagnosis not present

## 2020-12-11 DIAGNOSIS — Z5321 Procedure and treatment not carried out due to patient leaving prior to being seen by health care provider: Secondary | ICD-10-CM | POA: Insufficient documentation

## 2020-12-11 DIAGNOSIS — F17290 Nicotine dependence, other tobacco product, uncomplicated: Secondary | ICD-10-CM | POA: Diagnosis not present

## 2020-12-11 DIAGNOSIS — J9811 Atelectasis: Secondary | ICD-10-CM | POA: Diagnosis not present

## 2020-12-11 HISTORY — PX: SURAL NERVE BX: SHX2476

## 2020-12-11 LAB — CBC
HCT: 48 % (ref 39.0–52.0)
Hemoglobin: 17.2 g/dL — ABNORMAL HIGH (ref 13.0–17.0)
MCH: 29.5 pg (ref 26.0–34.0)
MCHC: 35.8 g/dL (ref 30.0–36.0)
MCV: 82.3 fL (ref 80.0–100.0)
Platelets: 175 10*3/uL (ref 150–400)
RBC: 5.83 MIL/uL — ABNORMAL HIGH (ref 4.22–5.81)
RDW: 14.1 % (ref 11.5–15.5)
WBC: 11.3 10*3/uL — ABNORMAL HIGH (ref 4.0–10.5)
nRBC: 0 % (ref 0.0–0.2)

## 2020-12-11 LAB — BASIC METABOLIC PANEL
Anion gap: 11 (ref 5–15)
BUN: 7 mg/dL (ref 6–20)
CO2: 25 mmol/L (ref 22–32)
Calcium: 9.6 mg/dL (ref 8.9–10.3)
Chloride: 99 mmol/L (ref 98–111)
Creatinine, Ser: 0.85 mg/dL (ref 0.61–1.24)
GFR, Estimated: >60 mL/min (ref >60)
Glucose, Bld: 206 mg/dL — ABNORMAL HIGH (ref 70–99)
Potassium: 4.2 mmol/L (ref 3.5–5.1)
Sodium: 135 mmol/L (ref 135–145)

## 2020-12-11 LAB — TROPONIN I (HIGH SENSITIVITY): Troponin I (High Sensitivity): 4 ng/L (ref <18)

## 2020-12-11 IMAGING — CR DG CHEST 2V
2 series · 2 of 2 positions shown · non-contrast
Comparison: [DATE]

CLINICAL DATA: Chest pain

EXAM:
CHEST - 2 VIEW

[chest lat]
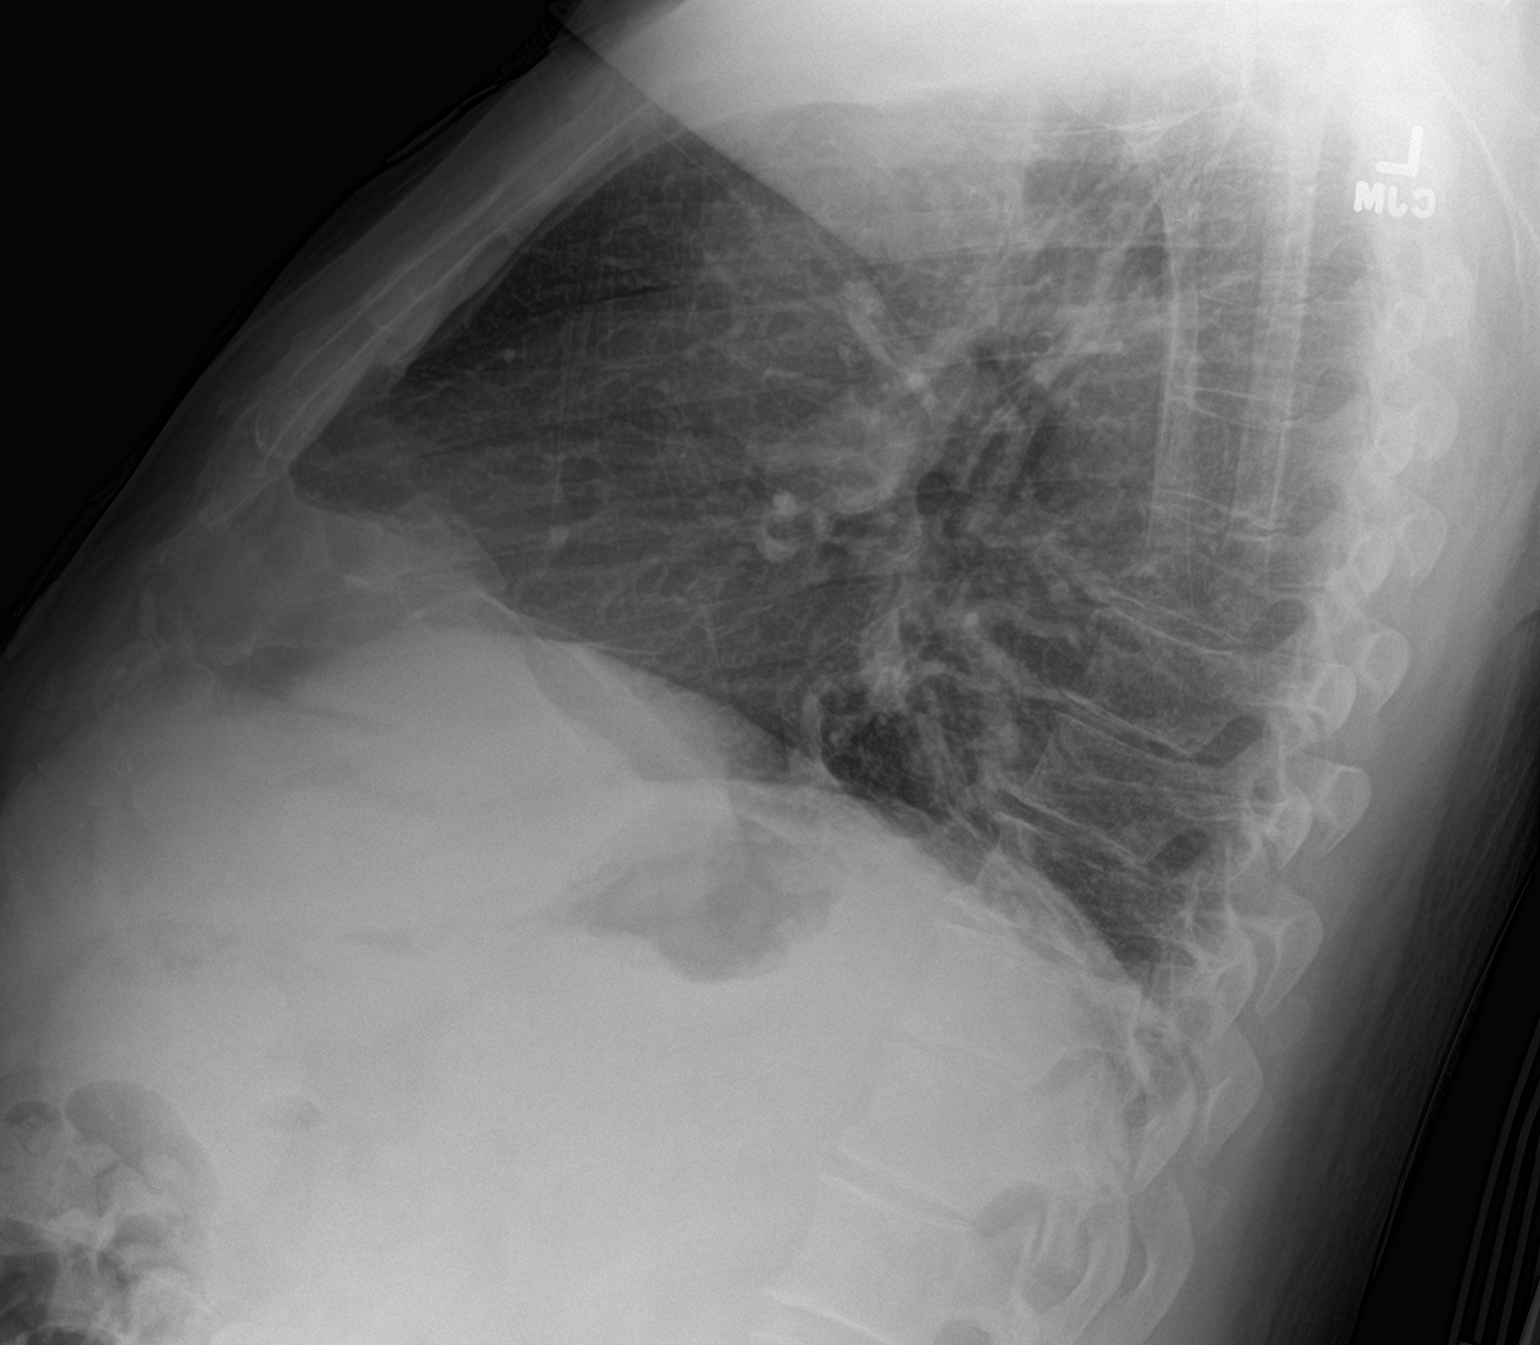

[chest ap]
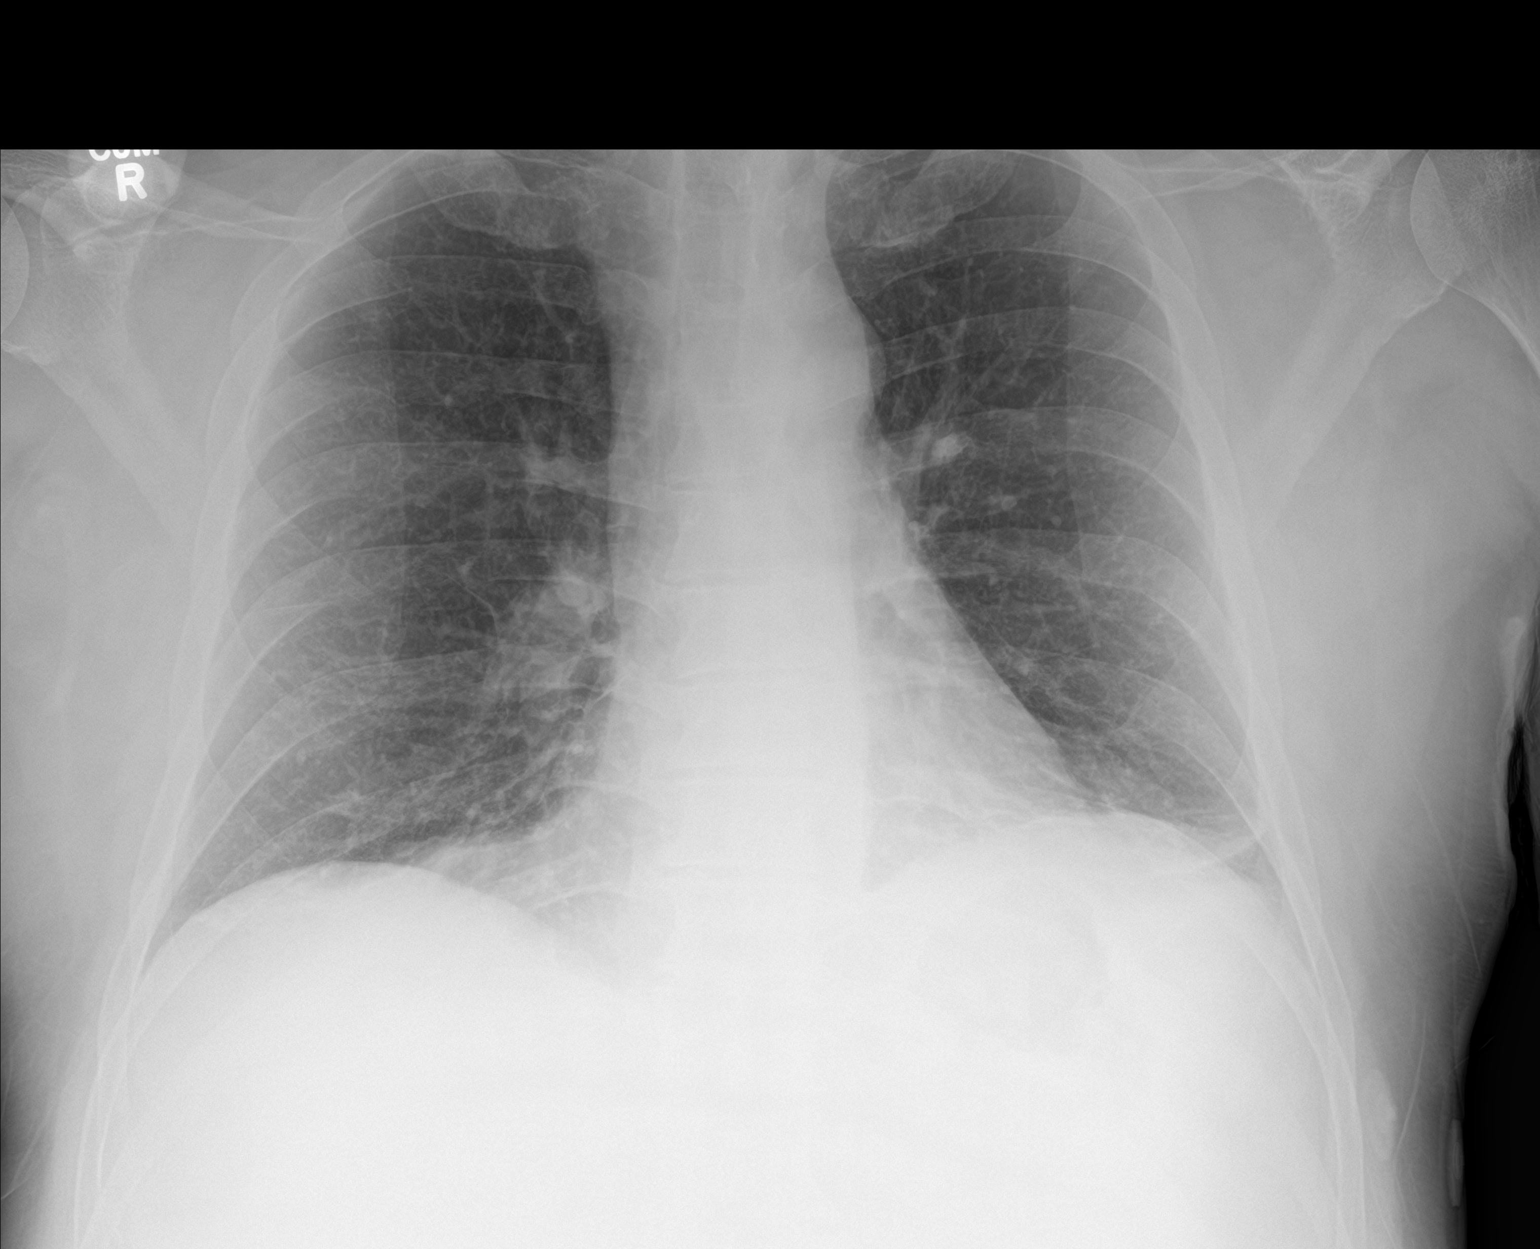

[2 of 2 positions shown; findings below may reference images not displayed]

FINDINGS: The heart size and mediastinal contours are within normal limits.
Probable bibasilar subsegmental atelectasis is noted. No large
airspace consolidation or pleural effusion. The visualized skeletal
structures are unremarkable.
IMPRESSION: Bibasilar subsegmental atelectasis.

## 2020-12-11 SURGERY — SURAL NERVE BIOPSY
Anesthesia: General | Laterality: Right

## 2020-12-11 MED ORDER — THROMBIN 5000 UNITS EX SOLR
CUTANEOUS | Status: AC
  Filled 2020-12-11: qty 5000

## 2020-12-11 MED ORDER — LIDOCAINE-EPINEPHRINE 1 %-1:100000 IJ SOLN
INTRAMUSCULAR | Status: AC
  Filled 2020-12-11: qty 1

## 2020-12-11 MED ORDER — CEFAZOLIN SODIUM-DEXTROSE 2-4 GM/100ML-% IV SOLN
2.0000 g | INTRAVENOUS | Status: AC
  Administered 2020-12-11: 2 g via INTRAVENOUS
  Filled 2020-12-11: qty 100

## 2020-12-11 MED ORDER — CHLORHEXIDINE GLUCONATE 0.12 % MT SOLN
15.0000 mL | Freq: Once | OROMUCOSAL | Status: AC
  Administered 2020-12-11: 15 mL via OROMUCOSAL
  Filled 2020-12-11: qty 15

## 2020-12-11 MED ORDER — ACETAMINOPHEN 10 MG/ML IV SOLN
INTRAVENOUS | Status: DC | PRN
  Administered 2020-12-11: 1000 mg via INTRAVENOUS

## 2020-12-11 MED ORDER — FENTANYL CITRATE (PF) 100 MCG/2ML IJ SOLN
INTRAMUSCULAR | Status: DC | PRN
  Administered 2020-12-11: 100 ug via INTRAVENOUS
  Administered 2020-12-11: 50 ug via INTRAVENOUS

## 2020-12-11 MED ORDER — KETOROLAC TROMETHAMINE 30 MG/ML IJ SOLN: 30.0000 mg | Freq: Once | INTRAMUSCULAR | Status: DC | PRN

## 2020-12-11 MED ORDER — FENTANYL CITRATE (PF) 250 MCG/5ML IJ SOLN
INTRAMUSCULAR | Status: AC
  Filled 2020-12-11: qty 5

## 2020-12-11 MED ORDER — LACTATED RINGERS IV SOLN: INTRAVENOUS | Status: DC

## 2020-12-11 MED ORDER — HYDROCODONE-ACETAMINOPHEN 5-325 MG PO TABS: 1.0000 | ORAL_TABLET | Freq: Four times a day (QID) | ORAL | 0 refills | Status: DC | PRN

## 2020-12-11 MED ORDER — LIDOCAINE 2% (20 MG/ML) 5 ML SYRINGE
INTRAMUSCULAR | Status: DC | PRN
  Administered 2020-12-11: 100 mg via INTRAVENOUS

## 2020-12-11 MED ORDER — ONDANSETRON HCL 4 MG/2ML IJ SOLN
INTRAMUSCULAR | Status: DC | PRN
  Administered 2020-12-11: 4 mg via INTRAVENOUS

## 2020-12-11 MED ORDER — FENTANYL CITRATE (PF) 100 MCG/2ML IJ SOLN
INTRAMUSCULAR | Status: AC
  Filled 2020-12-11: qty 2

## 2020-12-11 MED ORDER — MIDAZOLAM HCL 2 MG/2ML IJ SOLN
INTRAMUSCULAR | Status: AC
  Filled 2020-12-11: qty 2

## 2020-12-11 MED ORDER — ROCURONIUM BROMIDE 10 MG/ML (PF) SYRINGE
PREFILLED_SYRINGE | INTRAVENOUS | Status: DC | PRN
  Administered 2020-12-11: 70 mg via INTRAVENOUS

## 2020-12-11 MED ORDER — PHENYLEPHRINE HCL-NACL 10-0.9 MG/250ML-% IV SOLN
INTRAVENOUS | Status: DC | PRN
  Administered 2020-12-11: 30 ug/min via INTRAVENOUS

## 2020-12-11 MED ORDER — PROPOFOL 10 MG/ML IV BOLUS
INTRAVENOUS | Status: DC | PRN
  Administered 2020-12-11: 150 mg via INTRAVENOUS

## 2020-12-11 MED ORDER — GLYCOPYRROLATE PF 0.2 MG/ML IJ SOSY
PREFILLED_SYRINGE | INTRAMUSCULAR | Status: DC | PRN
  Administered 2020-12-11: .2 mg via INTRAVENOUS

## 2020-12-11 MED ORDER — 0.9 % SODIUM CHLORIDE (POUR BTL) OPTIME
TOPICAL | Status: DC | PRN
  Administered 2020-12-11: 1000 mL

## 2020-12-11 MED ORDER — ORAL CARE MOUTH RINSE: 15.0000 mL | Freq: Once | OROMUCOSAL | Status: AC

## 2020-12-11 MED ORDER — CHLORHEXIDINE GLUCONATE CLOTH 2 % EX PADS: 6.0000 | MEDICATED_PAD | Freq: Once | CUTANEOUS | Status: DC

## 2020-12-11 MED ORDER — PROPOFOL 10 MG/ML IV BOLUS
INTRAVENOUS | Status: AC
  Filled 2020-12-11: qty 20

## 2020-12-11 MED ORDER — DEXMEDETOMIDINE (PRECEDEX) IN NS 20 MCG/5ML (4 MCG/ML) IV SYRINGE
PREFILLED_SYRINGE | INTRAVENOUS | Status: DC | PRN
  Administered 2020-12-11: 8 ug via INTRAVENOUS

## 2020-12-11 MED ORDER — FENTANYL CITRATE (PF) 100 MCG/2ML IJ SOLN
25.0000 ug | INTRAMUSCULAR | Status: DC | PRN
  Administered 2020-12-11 (×2): 50 ug via INTRAVENOUS

## 2020-12-11 MED ORDER — DEXAMETHASONE SODIUM PHOSPHATE 10 MG/ML IJ SOLN
INTRAMUSCULAR | Status: DC | PRN
  Administered 2020-12-11: 5 mg via INTRAVENOUS

## 2020-12-11 MED ORDER — LIDOCAINE-EPINEPHRINE 1 %-1:100000 IJ SOLN
INTRAMUSCULAR | Status: DC | PRN
Start: 2020-12-11 — End: 2020-12-11
  Administered 2020-12-11: 3.5 mL

## 2020-12-11 MED ORDER — BUPIVACAINE-EPINEPHRINE 0.25% -1:200000 IJ SOLN
INTRAMUSCULAR | Status: DC | PRN
  Administered 2020-12-11: 3.5 mL

## 2020-12-11 MED ORDER — SUGAMMADEX SODIUM 200 MG/2ML IV SOLN
INTRAVENOUS | Status: DC | PRN
  Administered 2020-12-11: 400 mg via INTRAVENOUS

## 2020-12-11 MED ORDER — MIDAZOLAM HCL 5 MG/5ML IJ SOLN
INTRAMUSCULAR | Status: DC | PRN
  Administered 2020-12-11: 2 mg via INTRAVENOUS

## 2020-12-11 MED FILL — PARoxetine HCL 10 MG TABS: 10 | 30 days supply | Qty: 30 | Fill #2

## 2020-12-11 MED FILL — HYDROCODON-APAP 5-325: 5-325 | 5 days supply | Qty: 20 | Fill #0

## 2020-12-11 SURGICAL SUPPLY — 58 items
ADH SKN CLS APL DERMABOND .7 (GAUZE/BANDAGES/DRESSINGS) ×1
BAG BANDED W/RUBBER/TAPE 36X54 (MISCELLANEOUS) IMPLANT
BAG EQP BAND 135X91 W/RBR TAPE (MISCELLANEOUS)
BUR 2.5 MTCH HD 16 (BUR) IMPLANT
CARTRIDGE OIL MAESTRO DRILL (MISCELLANEOUS) ×1 IMPLANT
CNTNR URN SCR LID CUP LEK RST (MISCELLANEOUS) ×1 IMPLANT
CONT SPEC 4OZ STRL OR WHT (MISCELLANEOUS) ×2
COVER BACK TABLE 60X90IN (DRAPES) IMPLANT
COVER MAYO STAND STRL (DRAPES) IMPLANT
COVER WAND RF STERILE (DRAPES) ×2 IMPLANT
DECANTER SPIKE VIAL GLASS SM (MISCELLANEOUS) ×2 IMPLANT
DERMABOND ADVANCED (GAUZE/BANDAGES/DRESSINGS) ×1
DERMABOND ADVANCED .7 DNX12 (GAUZE/BANDAGES/DRESSINGS) IMPLANT
DIFFUSER DRILL AIR PNEUMATIC (MISCELLANEOUS) ×2 IMPLANT
DRAPE C-ARMOR (DRAPES) IMPLANT
DRAPE LAPAROTOMY 100X72 PEDS (DRAPES) ×2 IMPLANT
DRAPE LAPAROTOMY 100X72X124 (DRAPES) ×1 IMPLANT
DRAPE SURG 17X23 STRL (DRAPES) ×2 IMPLANT
DRSG OPSITE POSTOP 3X4 (GAUZE/BANDAGES/DRESSINGS) ×1 IMPLANT
DURAPREP 26ML APPLICATOR (WOUND CARE) ×2 IMPLANT
ELECT COATED BLADE 2.86 ST (ELECTRODE) ×2 IMPLANT
ELECT REM PT RETURN 9FT ADLT (ELECTROSURGICAL) ×2
ELECTRODE REM PT RTRN 9FT ADLT (ELECTROSURGICAL) ×1 IMPLANT
GAUZE 4X4 16PLY RFD (DISPOSABLE) IMPLANT
GAUZE SPONGE 4X4 12PLY STRL (GAUZE/BANDAGES/DRESSINGS) ×1 IMPLANT
GLOVE BIOGEL PI IND STRL 8 (GLOVE) ×2 IMPLANT
GLOVE BIOGEL PI INDICATOR 8 (GLOVE) ×1
GLOVE ECLIPSE 8.0 STRL XLNG CF (GLOVE) ×4 IMPLANT
GOWN STRL REUS W/ TWL LRG LVL3 (GOWN DISPOSABLE) IMPLANT
GOWN STRL REUS W/ TWL XL LVL3 (GOWN DISPOSABLE) ×1 IMPLANT
GOWN STRL REUS W/TWL 2XL LVL3 (GOWN DISPOSABLE) ×1 IMPLANT
GOWN STRL REUS W/TWL LRG LVL3 (GOWN DISPOSABLE)
GOWN STRL REUS W/TWL XL LVL3 (GOWN DISPOSABLE) ×2
KIT BASIN OR (CUSTOM PROCEDURE TRAY) ×2 IMPLANT
KIT POSITION SURG JACKSON T1 (MISCELLANEOUS) ×1 IMPLANT
KIT TURNOVER KIT B (KITS) ×2 IMPLANT
LOOP VESSEL MAXI BLUE (MISCELLANEOUS) ×2 IMPLANT
MARKER SKIN DUAL TIP RULER LAB (MISCELLANEOUS) ×2 IMPLANT
NEEDLE HYPO 25X1 1.5 SAFETY (NEEDLE) ×2 IMPLANT
NS IRRIG 1000ML POUR BTL (IV SOLUTION) ×2 IMPLANT
OIL CARTRIDGE MAESTRO DRILL (MISCELLANEOUS)
PACK LAMINECTOMY NEURO (CUSTOM PROCEDURE TRAY) ×2 IMPLANT
PAD ARMBOARD 7.5X6 YLW CONV (MISCELLANEOUS) ×3 IMPLANT
PATTIES SURGICAL .5 X.5 (GAUZE/BANDAGES/DRESSINGS) IMPLANT
PATTIES SURGICAL .5 X1 (DISPOSABLE) IMPLANT
PATTIES SURGICAL 1X1 (DISPOSABLE) IMPLANT
SPONGE LAP 4X18 RFD (DISPOSABLE) IMPLANT
STAPLER VISISTAT 35W (STAPLE) IMPLANT
SUT VIC AB 0 CT1 27 (SUTURE) ×2
SUT VIC AB 0 CT1 27XBRD ANBCTR (SUTURE) ×1 IMPLANT
SUT VIC AB 2-0 CP2 18 (SUTURE) ×2 IMPLANT
SUT VIC AB 3-0 SH 8-18 (SUTURE) IMPLANT
SWAB COLLECTION DEVICE MRSA (MISCELLANEOUS) IMPLANT
SWAB CULTURE ESWAB REG 1ML (MISCELLANEOUS) IMPLANT
TOWEL GREEN STERILE (TOWEL DISPOSABLE) IMPLANT
TOWEL GREEN STERILE FF (TOWEL DISPOSABLE) IMPLANT
TRAY FOLEY MTR SLVR 16FR STAT (SET/KITS/TRAYS/PACK) IMPLANT
WATER STERILE IRR 1000ML POUR (IV SOLUTION) ×2 IMPLANT

## 2020-12-11 NOTE — Discharge Instructions (Addendum)
OK TO SHOWER IN 3 DAYS THEN REMOVED DRESSING, KEEP CLEAN AND DRY, PAT WOUND DRY, LET SOAP AND WATER RUN OVER THE WOUND LIGHT ACTIVITY LIMITED TO 15 LBS AND WALKING FOR 2 WEEKS

## 2020-12-11 NOTE — Anesthesia Procedure Notes (Signed)
Procedure Name: Intubation Performed by: Cleda Daub, CRNA Pre-anesthesia Checklist: Patient identified, Emergency Drugs available, Suction available and Patient being monitored Patient Re-evaluated:Patient Re-evaluated prior to induction Oxygen Delivery Method: Circle system utilized Preoxygenation: Pre-oxygenation with 100% oxygen Induction Type: IV induction Ventilation: Mask ventilation without difficulty Laryngoscope Size: Glidescope (for moderately severe cervical stenosis. ) Tube type: Oral Tube size: 7.5 mm Number of attempts: 1 Airway Equipment and Method: Stylet and Oral airway Placement Confirmation: ETT inserted through vocal cords under direct vision,  positive ETCO2 and breath sounds checked- equal and bilateral Secured at: 23 cm Tube secured with: Tape Dental Injury: Teeth and Oropharynx as per pre-operative assessment

## 2020-12-11 NOTE — ED Triage Notes (Signed)
Pt states just after eating chinese food he felt a pain in his throat area and all through the middle of his chest. Vomiting x 1.

## 2020-12-11 NOTE — Transfer of Care (Signed)
Immediate Anesthesia Transfer of Care Note  Patient: Alan Sanders  Procedure(s) Performed: SURAL NERVE BIOPSY (Right )  Patient Location: PACU  Anesthesia Type:General  Level of Consciousness: awake, alert , oriented and patient cooperative  Airway & Oxygen Therapy: Patient Spontanous Breathing and Patient connected to face mask oxygen  Post-op Assessment: Report given to RN and Post -op Vital signs reviewed and stable  Post vital signs: Reviewed and stable  Last Vitals:  Vitals Value Taken Time  BP 127/86 12/11/20 0846  Temp 36.2 C 12/11/20 0845  Pulse 94 12/11/20 0858  Resp 27 12/11/20 0858  SpO2 97 % 12/11/20 0858  Vitals shown include unvalidated device data.  Last Pain:  Vitals:   12/11/20 0855  TempSrc:   PainSc: 10-Worst pain ever      Patients Stated Pain Goal: 3 (83/15/17 6160)  Complications: No complications documented.

## 2020-12-11 NOTE — Anesthesia Postprocedure Evaluation (Signed)
Anesthesia Post Note  Patient: SAKETH DAUBERT  Procedure(s) Performed: SURAL NERVE BIOPSY (Right )     Patient location during evaluation: PACU Anesthesia Type: General Level of consciousness: awake and alert Pain management: pain level controlled Vital Signs Assessment: post-procedure vital signs reviewed and stable Respiratory status: spontaneous breathing, nonlabored ventilation, respiratory function stable and patient connected to nasal cannula oxygen Cardiovascular status: blood pressure returned to baseline and stable Postop Assessment: no apparent nausea or vomiting Anesthetic complications: no   No complications documented.  Last Vitals:  Vitals:   12/11/20 0930 12/11/20 0945  BP: 101/65 124/82  Pulse: 69 95  Resp: (!) 21 (!) 22  Temp:  (!) 36.2 C  SpO2: 92% 93%    Last Pain:  Vitals:   12/11/20 0945  TempSrc:   PainSc: 4         RLE Motor Response: Purposeful movement;Responds to commands (12/11/20 0945) RLE Sensation: Decreased (12/11/20 0945)      Mckinzi Eriksen S

## 2020-12-11 NOTE — H&P (Signed)
Providing Compassionate, Quality Care - Together  NEUROSURGERY HISTORY & PHYSICAL   Alan Sanders is an 50 y.o. male.   Chief Complaint: Bilateral lower extremity numbness HPI: This is a 50 year old male with a history of anxiety that complains of bilateral lower extremity numbness for a year and a half.  He was seen by neurology in which EMG showed severe axonal sensorimotor polyneuropathy.  His numbness is bilateral in his feet in a stocking glove pattern up to his knees.  Is been progressively worsening over the past few months.  He also has ataxia due to his numbness in his legs.  He feels as though his legs are also slightly weak and he has difficulty walking.  He denies any bowel or bladder changes.  He denies any numbness, tingling or weakness in his arms.  He denies any dropping objects in his hands.  He denies any neck pain.  Past Medical History:  Diagnosis Date  . Arthritis    " in my back "  . Asthma   . Balance problem   . Gait difficulty   . GERD (gastroesophageal reflux disease)   . Headache    migraines  . Inguinal hernia of left side without obstruction or gangrene   . Pneumonia   . Seizures ([place])    " its been along time ,since my last seizure "  . Spleen enlarged   . Thrombocytopenia ([place])   . Tobacco abuse     Past Surgical History:  Procedure Laterality Date  . HERNIA REPAIR     inguinal    Family History  Problem Relation Age of Onset  . Diabetes Mother   . Hypertension Mother   . COPD Mother   . Alcoholism Brother    Social History:  reports that he has been smoking cigarettes. He has a 12.00 pack-year smoking history. His smokeless tobacco use includes snuff. He reports that he does not drink alcohol and does not use drugs.  Allergies: No Known Allergies  Medications Prior to Admission  Medication Sig Dispense Refill  . acetaminophen (TYLENOL) 500 MG tablet Take 1,000 mg by mouth every 6 (six) hours as needed for headache.    .  gabapentin (NEURONTIN) 300 MG capsule Take 300 mg by mouth 3 (three) times daily.    Marland Kitchen ibuprofen (ADVIL,MOTRIN) 200 MG tablet Take 400 mg by mouth every 6 (six) hours as needed for headache, mild pain, moderate pain or cramping.    Marland Kitchen PARoxetine (PAXIL) 10 MG tablet TAKE 1 TABLET BY MOUTH ONCE DAILY (Patient taking differently: Take by mouth daily.) 30 tablet 2  . famotidine (PEPCID) 20 MG tablet Take 1 tablet (20 mg total) by mouth 2 (two) times daily. 30 tablet 0    No results found for this or any previous visit (from the past 48 hour(s)). No results found.  ROS 14 point systems was reviewed, all pertinent positives and negatives are listed HPI above  Blood pressure 134/89, pulse 91, temperature 98.6 F (37 C), temperature source Oral, resp. rate 18, height 6\' 2"  (1.88 m), weight 104.3 kg, SpO2 96 %. Physical Exam  A&O x3 PERRLA EOMI Face symmetric Cranial nerves II through XII intact Bilateral upper extremity 5/5 Bilateral lower extremity 4+/5 Decrease sensation light touch bilateral lower extremities below the knees  Assessment/Plan 50 year old male with  1. Bilateral lower extremity neuropathy  -OR today for right sural nerve biopsy -N.p.o. -Discussed with Dr. Leta Baptist, referring neurologist recommended a sural nerve biopsy.  Thank you for allowing me to participate in this patient's care.  Please do not hesitate to call with questions or concerns.   Elwin Sleight, [facility] Cell: [phone]

## 2020-12-11 NOTE — ED Triage Notes (Signed)
Pt arrives via [facility]. Surgical procedure this morning (sural nerve biopsy with general anethesia & intubation), instructed to only eat soft food. After eating chinese food he is having generalized chest pain and throat pain. 324 ASA and 1 nitro en route, no change in pain. 18 gauge in the right wrist, 12 lead unremarkable. 152/86, hr 92, 99% RA.

## 2020-12-11 NOTE — Progress Notes (Signed)
   Providing Compassionate, Quality Care - Together  NEUROSURGERY PROGRESS NOTE   S: pt s/e in recovery  O: EXAM:  BP 127/86 (BP Location: Right Arm)   Pulse 79   Temp (!) 97.1 F (36.2 C)   Resp 19   Ht 6\' 2"  (1.88 m)   Wt 104.3 kg   SpO2 95%   BMI 29.53 kg/m   Awake, alert, oriented  Speech fluent, appropriate  CNs grossly intact  No hoffmans No hyperreflexia 5/5 BUE 4/5 BLE  Incision c/d/i, lateral right foot numbness  ASSESSMENT:  50 y.o. male with  1. Severe sensorimotor polyneuropathy  -s/p right sural nerve biopsy  PLAN: - dc home -updated wife on wound care -rx given    Thank you for allowing me to participate in this patient's care.  Please do not hesitate to call with questions or concerns.   Elwin Sleight, [facility] Cell: [phone]

## 2020-12-11 NOTE — Op Note (Signed)
   Providing Compassionate, Quality Care - Together  Date of service: 12/11/2020  PREOP DIAGNOSIS:  Severe sensorimotor polyneuropathy  POSTOP DIAGNOSIS: Same  PROCEDURE: Right sural nerve biopsy  SURGEON: Dr. Pieter Partridge C. Hillard Goodwine, DO  ASSISTANT: None  ANESTHESIA: General Endotracheal  EBL: 5 cc  SPECIMENS: Right sural nerve, fresh  DRAINS: None  COMPLICATIONS: None  CONDITION: Hemodynamically stable  HISTORY: SUMEDH SHINSATO is a 50 y.o. male who presented to my office with bilateral lower extremity numbness/tingling in a stocking glove type pattern.  EMG revealed severe sensory motor polyneuropathy.  He also complained of bilateral lower extremity weakness and difficulty walking.  MRI of the brain, thoracic and cervical spine were performed.  The only significant finding was moderate stenosis at C3-4 on MRI cervical spine, patient had no upper motor neuron problems or complaints of fine motor movement, numbness tingling or weakness in the hands.  Dr. Leta Baptist requested a sural nerve biopsy to further diagnosis neuropathy.  I discussed the risk, benefits and alternatives with the patient, which he agreed to proceed.  PROCEDURE IN DETAIL: The patient was brought to the operating room. After induction of general anesthesia, the patient was positioned on the operative table in lateral decubitus position, right side up. All pressure points were meticulously padded. Skin incision was then marked out at the midway point between the lateral malleolus and the lateral border of the Achilles tendon and prepped and draped in the usual sterile fashion.  Physician driven timeout was performed.  Local anesthetic was injected into the planned incision.  Incision was made with 15 blade.  This was performed down to the soft tissue.  Self-retaining retractors placed in the wound.  Using gentle blunt dissection the sural nerve was encountered and dissected freely superiorly and inferiorly, and  circumferentially.  A vessel loop was gently placed around the nerve.  Using gentle traction inferiorly with the vessel loop the sural nerve was cut to a length of about 5cm with mets.  The distal portion of the nerve was cut with mets.  This was sent as a fresh specimen. Hemostasis was achieved with bipolar cautery. The wound was explored and noted to be excellently hemostatic. The retractor was removed. The wound was then closed in layers with inverted 2-0 vicryl sutures and the skin was closed with skin glue. Sterile dressing was applied and the drapes were taken down.  At the end of the case all sponge, needle, and instrument counts were correct. The patient was then transferred to the stretcher, extubated, and taken to the post-anesthesia care unit in stable hemodynamic condition.

## 2020-12-12 ENCOUNTER — Encounter (HOSPITAL_COMMUNITY): Payer: Self-pay | Admitting: Neurological Surgery

## 2020-12-12 NOTE — ED Notes (Signed)
Pt called for room no answer x3 

## 2020-12-17 ENCOUNTER — Other Ambulatory Visit (HOSPITAL_COMMUNITY): Payer: Self-pay | Admitting: Neurological Surgery

## 2020-12-17 MED FILL — HYDROCODON-APAP 5-325: 5-325 | 5 days supply | Qty: 30 | Fill #0

## 2020-12-26 ENCOUNTER — Other Ambulatory Visit (HOSPITAL_COMMUNITY): Payer: Self-pay | Admitting: Neurological Surgery

## 2020-12-26 MED FILL — HYDROCODON-APAP 5-325: 5-325 | 5 days supply | Qty: 30 | Fill #0

## 2020-12-31 ENCOUNTER — Telehealth: Payer: Self-pay | Admitting: Diagnostic Neuroimaging

## 2020-12-31 NOTE — Telephone Encounter (Signed)
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  He states Dr Reatha Armour advised him to schedule a f/u with Dr Leta Baptist after his surgery.  No call back requested, phone rep wanted RN aware of the reason for the appointment.

## 2020-12-31 NOTE — Telephone Encounter (Addendum)
Called Dr Rubbie Battiest office to get biopsy result. Medical records stated they don't have results. Called [facility], spoke with Amber who stated they send these to outside path lab. She'll call lab and call me back to advise when we will  get result.

## 2021-01-01 ENCOUNTER — Ambulatory Visit: Payer: 59 | Admitting: Diagnostic Neuroimaging

## 2021-01-01 ENCOUNTER — Encounter: Payer: Self-pay | Admitting: Diagnostic Neuroimaging

## 2021-01-01 NOTE — Telephone Encounter (Signed)
Called [facility] and spoke with [facility]. Alan Sanders stated she called and was told the path was not released yet. She is waiting for call back from path lab in [place] and will call me when she has report.

## 2021-01-01 NOTE — Telephone Encounter (Addendum)
Called [facility], spoke with Claiborne Billings to check status of nerve biopsy path report. She stated it's not back from outside lab which is in [place]. She stated Varney Biles will call the lab when they open and then call me to update. Called patient to advise him Dr Leta Baptist may not have results for his 11 am FU today. The patient asked if he could be called with the results later today; I advised we can do that and reschedule FU per Dr Leta Baptist. Patient verbalized understanding, appreciation. FU canceled for today.

## 2021-01-02 NOTE — Telephone Encounter (Signed)
Called [facility], spoke with Amber who stated they just received nerve biopsy report. She will have pathologist look over report and then fax it over.

## 2021-01-03 ENCOUNTER — Other Ambulatory Visit: Payer: Self-pay | Admitting: Family Medicine

## 2021-01-03 ENCOUNTER — Encounter (HOSPITAL_COMMUNITY): Payer: Self-pay

## 2021-01-03 DIAGNOSIS — F341 Dysthymic disorder: Secondary | ICD-10-CM

## 2021-01-03 NOTE — Telephone Encounter (Signed)
Received report of nerve biopsy from [facility]. Placed on MD desk for review.

## 2021-01-04 MED FILL — PARoxetine HCL 10 MG TABS: 10 | 30 days supply | Qty: 30 | Fill #0

## 2021-01-07 ENCOUNTER — Other Ambulatory Visit: Payer: Self-pay | Admitting: Diagnostic Neuroimaging

## 2021-01-07 DIAGNOSIS — G629 Polyneuropathy, unspecified: Secondary | ICD-10-CM

## 2021-01-07 MED FILL — HYDROCODON-APAP 5-325: 5-325 | 5 days supply | Qty: 30 | Fill #0

## 2021-01-07 NOTE — Telephone Encounter (Signed)
Biopsy confirms neuropathy but no cause found. I will order lumbar puncture as final testing for neuropathy workup. Otherwise continue rehab and recovery from spine surgery. -VRP

## 2021-01-07 NOTE — Telephone Encounter (Signed)
Called patient and LVM requesting call back tomorrow.

## 2021-01-07 NOTE — Progress Notes (Signed)
Orders Placed This Encounter  Procedures  . DG FL GUIDED LUMBAR PUNCTURE    Penni Bombard, MD 3/83/2919, 1:66 PM Certified in Neurology, Neurophysiology and [facility] [address] [place], [place] [address] [phone]

## 2021-01-08 NOTE — Telephone Encounter (Signed)
Pt has returned the call to [facility], RN please call pt back

## 2021-01-08 NOTE — Telephone Encounter (Signed)
Called patient and informed him of Dr [employer] message. I advised he call [facility] to schedule LP, gave him their #. Patient verbalized understanding, appreciation.

## 2021-01-15 LAB — SURGICAL PATHOLOGY

## 2021-01-17 ENCOUNTER — Other Ambulatory Visit: Payer: Self-pay | Admitting: Diagnostic Neuroimaging

## 2021-01-17 ENCOUNTER — Other Ambulatory Visit (HOSPITAL_COMMUNITY)
Admission: RE | Admit: 2021-01-17 | Discharge: 2021-01-17 | Disposition: A | Discharge status: Home / Self Care | Payer: 59 | Source: Ambulatory Visit | Attending: Diagnostic Neuroimaging | Admitting: Diagnostic Neuroimaging

## 2021-01-17 ENCOUNTER — Ambulatory Visit
Admission: RE | Admit: 2021-01-17 | Discharge: 2021-01-17 | Disposition: A | Discharge status: Home / Self Care | Payer: 59 | Source: Ambulatory Visit | Attending: Diagnostic Neuroimaging | Admitting: Diagnostic Neuroimaging

## 2021-01-17 ENCOUNTER — Other Ambulatory Visit: Payer: Self-pay

## 2021-01-17 VITALS — BP 119/69 | HR 72

## 2021-01-17 DIAGNOSIS — G5793 Unspecified mononeuropathy of bilateral lower limbs: Secondary | ICD-10-CM | POA: Diagnosis not present

## 2021-01-17 DIAGNOSIS — D696 Thrombocytopenia, unspecified: Secondary | ICD-10-CM

## 2021-01-17 DIAGNOSIS — G629 Polyneuropathy, unspecified: Secondary | ICD-10-CM | POA: Diagnosis not present

## 2021-01-17 IMAGING — XA DG SPINAL PUNCT LUMBAR DIAG WITH FL CT GUIDANCE
1 series · 1 of 1 positions shown · non-contrast
Comparison: none

CLINICAL DATA: Lower extremity neuropathy

[Series 1: ortho adipose · 1 of 1 slices shown]
[im 1/1]
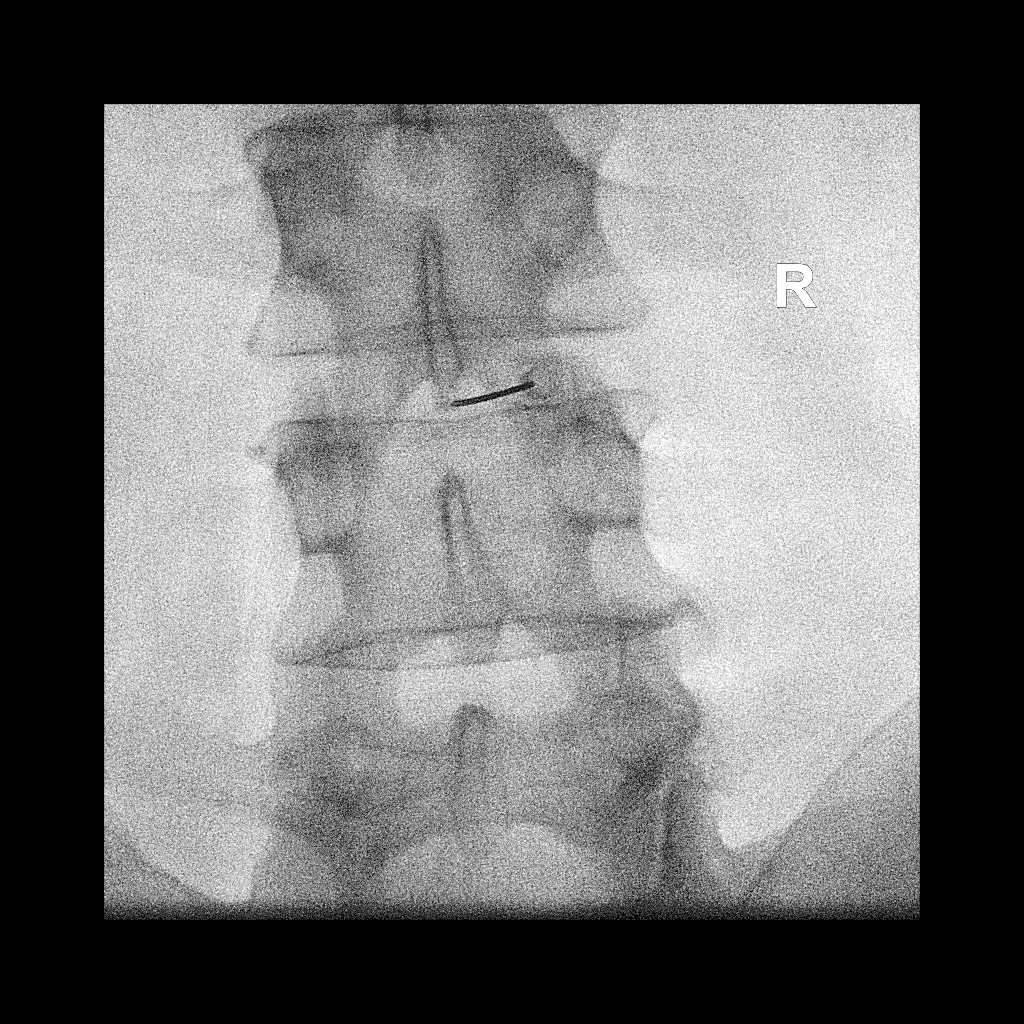

[1 of 1 positions shown; findings below may reference images not displayed]

EXAM:
DIAGNOSTIC LUMBAR PUNCTURE UNDER FLUOROSCOPIC GUIDANCE

FLUOROSCOPY TIME:  10 seconds; 21  [LN] DAP

PROCEDURE:
Informed consent was obtained from the patient prior to the
procedure, including potential complications of headache, allergy,
and pain. With the patient prone, the lower back was prepped with
Betadine. 1% Lidocaine was used for local anesthesia. Lumbar
puncture was performed at the L3-4 level from a right parasagittal
approach using a 20 gauge needle with return of clear colorless CSF
with an opening pressure of 9 cm water. 6 ml of CSF were obtained
for laboratory studies. The patient tolerated the procedure well and
there were no apparent complications.
IMPRESSION: Technically successful lumbar puncture under fluoroscopy

## 2021-01-17 NOTE — Discharge Instructions (Signed)

## 2021-01-18 LAB — HOUSE ACCOUNT TRACKING

## 2021-01-18 LAB — GLUCOSE, CSF: Glucose, CSF: 83 mg/dL — ABNORMAL HIGH (ref 40–80)

## 2021-01-18 LAB — CYTOLOGY - NON PAP

## 2021-01-21 LAB — TIQ-NTM

## 2021-01-27 LAB — CLIENT EDUCATION TRACKING

## 2021-01-27 LAB — GLUCOSE, CSF

## 2021-01-31 ENCOUNTER — Encounter (HOSPITAL_COMMUNITY): Payer: Self-pay

## 2021-02-06 ENCOUNTER — Other Ambulatory Visit (HOSPITAL_COMMUNITY): Payer: Self-pay | Admitting: Neurological Surgery

## 2021-02-07 ENCOUNTER — Other Ambulatory Visit (HOSPITAL_COMMUNITY): Payer: Self-pay | Admitting: Neurological Surgery

## 2021-02-15 ENCOUNTER — Other Ambulatory Visit (HOSPITAL_BASED_OUTPATIENT_CLINIC_OR_DEPARTMENT_OTHER): Payer: Self-pay

## 2021-02-18 ENCOUNTER — Encounter: Payer: Self-pay | Admitting: Family Medicine

## 2021-02-18 ENCOUNTER — Other Ambulatory Visit: Payer: Self-pay | Admitting: Family Medicine

## 2021-02-18 ENCOUNTER — Ambulatory Visit: Payer: 59 | Admitting: Family Medicine

## 2021-02-18 ENCOUNTER — Other Ambulatory Visit: Payer: Self-pay

## 2021-02-18 VITALS — BP 132/78 | HR 89 | Temp 97.0°F | Ht 74.0 in | Wt 233.0 lb

## 2021-02-18 DIAGNOSIS — J301 Allergic rhinitis due to pollen: Secondary | ICD-10-CM | POA: Insufficient documentation

## 2021-02-18 DIAGNOSIS — F341 Dysthymic disorder: Secondary | ICD-10-CM | POA: Insufficient documentation

## 2021-02-18 DIAGNOSIS — Z720 Tobacco use: Secondary | ICD-10-CM | POA: Diagnosis not present

## 2021-02-18 MED ORDER — FLUTICASONE PROPIONATE 50 MCG/ACT NA SUSP: 2.0000 | Freq: Every day | NASAL | 6 refills | Status: DC

## 2021-02-18 MED ORDER — PAROXETINE HCL 20 MG PO TABS: 20.0000 mg | ORAL_TABLET | Freq: Every day | ORAL | 1 refills | Status: DC

## 2021-02-18 MED FILL — PARoxetine HCL 20 MG TABS: 20 | 90 days supply | Qty: 90 | Fill #0

## 2021-02-18 MED FILL — FLUTICASONE PROP 50 MCG SPR: 50 | 30 days supply | Qty: 16 | Fill #0

## 2021-02-18 NOTE — Progress Notes (Signed)
Established Patient Office Visit  Subjective:  Patient ID: Alan Sanders, male    DOB: December 05, 1970  Age: 50 y.o. MRN: [id number]  CC:  Chief Complaint  Patient presents with  . Follow-up    Routine follow up would like refill on allergy medication. Patient states that medication for anxiety does not seem to be working well would like this increased.     HPI Alan Sanders presents for follow-up of depression and anxiety as well as long history of nasal congestion sneezing postnasal drip cough runny nose with headache.  There has been no fever or chills.  No change in taste or smell.  No wheezing.  This usually comes in the springtime for him.  He has tried over-the-counter tablets that have not helped.  He does smoke.  Past Medical History:  Diagnosis Date  . Arthritis    " in my back "  . Asthma   . Balance problem   . Gait difficulty   . GERD (gastroesophageal reflux disease)   . Headache    migraines  . Inguinal hernia of left side without obstruction or gangrene   . Pneumonia   . Seizures ([place])    " its been along time ,since my last seizure "  . Spleen enlarged   . Thrombocytopenia ([place])   . Tobacco abuse     Past Surgical History:  Procedure Laterality Date  . HERNIA REPAIR     inguinal  . SURAL NERVE BX Right 12/11/2020   Procedure: SURAL NERVE BIOPSY;  Surgeon: Karsten Ro, DO;  Location: [facility];  Service: Neurosurgery;  Laterality: Right;    Family History  Problem Relation Age of Onset  . Diabetes Mother   . Hypertension Mother   . COPD Mother   . Alcoholism Brother     Social History   Socioeconomic History  . Marital status: Married    Spouse name: Not on file  . Number of children: 2  . Years of education: Not on file  . Highest education level: High school graduate  Occupational History  . Not on file  Tobacco Use  . Smoking status: Current Every Day Smoker    Packs/day: 0.50    Years: 24.00    Pack years: 12.00    Types: Cigarettes   . Smokeless tobacco: Current User    Types: Snuff  Vaping Use  . Vaping Use: Never used  Substance and Sexual Activity  . Alcohol use: No  . Drug use: No  . Sexual activity: Not on file  Other Topics Concern  . Not on file  Social History Narrative   Lives with spouse   Sweet tea, energy drinks, Diet Coke, work 12 hr shifts   Social Determinants of Health   Financial Resource Strain: Not on file  Food Insecurity: Not on file  Transportation Needs: Not on file  Physical Activity: Not on file  Stress: Not on file  Social Connections: Not on file  Intimate Partner Violence: Not on file    Outpatient Medications Prior to Visit  Medication Sig Dispense Refill  . acetaminophen (TYLENOL) 500 MG tablet Take 1,000 mg by mouth every 6 (six) hours as needed for headache.    . gabapentin (NEURONTIN) 300 MG capsule Take 300 mg by mouth 3 (three) times daily.    Marland Kitchen ibuprofen (ADVIL,MOTRIN) 200 MG tablet Take 400 mg by mouth every 6 (six) hours as needed for headache, mild pain, moderate pain or cramping.    Marland Kitchen  PARoxetine (PAXIL) 10 MG tablet TAKE 1 TABLET BY MOUTH ONCE DAILY 30 tablet 1  . famotidine (PEPCID) 20 MG tablet Take 1 tablet (20 mg total) by mouth 2 (two) times daily. (Patient not taking: Reported on 02/18/2021) 30 tablet 0  . HYDROcodone-acetaminophen (NORCO/VICODIN) 5-325 MG tablet Take 1 tablet by mouth every 6 (six) hours as needed for moderate pain. (Patient not taking: Reported on 02/18/2021) 20 tablet 0  . LORazepam (ATIVAN) 2 MG tablet Take by mouth.     No facility-administered medications prior to visit.    No Known Allergies  ROS Review of Systems  Constitutional: Negative for chills, diaphoresis, fatigue, fever and unexpected weight change.  HENT: Positive for congestion, postnasal drip, rhinorrhea and sneezing. Negative for sore throat and voice change.   Eyes: Negative for photophobia and visual disturbance.  Respiratory: Positive for cough. Negative for  shortness of breath and wheezing.   Cardiovascular: Negative.   Gastrointestinal: Negative.   Genitourinary: Negative.   Musculoskeletal: Negative for arthralgias and myalgias.  Skin: Negative for pallor and rash.  Neurological: Positive for dizziness.  Psychiatric/Behavioral: Positive for dysphoric mood. The patient is nervous/anxious.    Depression screen [facility] 2/9 02/18/2021 04/13/2020 04/13/2020  Decreased Interest 0 1 0  Down, Depressed, Hopeless 1 1 0  PHQ - 2 Score 1 2 0  Altered sleeping - 1 -  Tired, decreased energy - 0 -  Change in appetite - 0 -  Feeling bad or failure about yourself  - 0 -  Trouble concentrating - 0 -  Moving slowly or fidgety/restless - 0 -  Suicidal thoughts - 0 -  PHQ-9 Score - 3 -  Difficult doing work/chores - Not difficult at all -      Objective:    Physical Exam Vitals and nursing note reviewed.  Constitutional:      General: He is not in acute distress.    Appearance: Normal appearance. He is not ill-appearing, toxic-appearing or diaphoretic.  HENT:     Head: Normocephalic and atraumatic.     Right Ear: Tympanic membrane, ear canal and external ear normal.     Left Ear: Tympanic membrane, ear canal and external ear normal.     Mouth/Throat:     Mouth: Mucous membranes are moist.     Pharynx: Oropharynx is clear. No oropharyngeal exudate or posterior oropharyngeal erythema.  Eyes:     General: No scleral icterus.    Extraocular Movements: Extraocular movements intact.     Conjunctiva/sclera: Conjunctivae normal.     Pupils: Pupils are equal, round, and reactive to light.  Cardiovascular:     Rate and Rhythm: Normal rate and regular rhythm.  Pulmonary:     Effort: Pulmonary effort is normal.     Breath sounds: Normal breath sounds.  Abdominal:     General: Bowel sounds are normal.  Musculoskeletal:     Cervical back: No rigidity or tenderness.  Lymphadenopathy:     Cervical: No cervical adenopathy.  Skin:    General: Skin is warm  and dry.  Neurological:     Mental Status: He is alert and oriented to person, place, and time.  Psychiatric:        Behavior: Behavior normal.     BP 132/78   Pulse 89   Temp (!) 97 F (36.1 C) (Temporal)   Ht 6\' 2"  (1.88 m)   Wt 233 lb (105.7 kg)   SpO2 96%   BMI 29.92 kg/m  Wt Readings from Last 3  Encounters:  02/18/21 233 lb (105.7 kg)  12/11/20 230 lb (104.3 kg)  12/07/20 230 lb 8 oz (104.6 kg)     Health Maintenance Due  Topic Date Due  . COLONOSCOPY (Pts 45-57yrs Insurance coverage will need to be confirmed)  Never done  . COVID-19 Vaccine (3 - Booster for Pfizer series) 07/05/2020    There are no preventive care reminders to display for this patient.  Lab Results  Component Value Date   TSH 2.51 04/13/2020   Lab Results  Component Value Date   WBC 11.3 (H) 12/11/2020   HGB 17.2 (H) 12/11/2020   HCT 48.0 12/11/2020   MCV 82.3 12/11/2020   PLT 175 12/11/2020   Lab Results  Component Value Date   NA 135 12/11/2020   K 4.2 12/11/2020   CO2 25 12/11/2020   GLUCOSE 206 (H) 12/11/2020   BUN 7 12/11/2020   CREATININE 0.85 12/11/2020   BILITOT 0.6 04/13/2020   ALKPHOS 76 04/13/2020   AST 36 04/13/2020   ALT 65 (H) 04/13/2020   PROT 7.6 10/12/2020   ALBUMIN 4.9 04/13/2020   CALCIUM 9.6 12/11/2020   ANIONGAP 11 12/11/2020   GFR 116.03 04/13/2020   Lab Results  Component Value Date   CHOL 164 04/13/2020   Lab Results  Component Value Date   HDL 29.40 (L) 04/13/2020   Lab Results  Component Value Date   LDLCALC 109 (H) 04/13/2020   Lab Results  Component Value Date   TRIG 127.0 04/13/2020   Lab Results  Component Value Date   CHOLHDL 6 04/13/2020   Lab Results  Component Value Date   HGBA1C 5.3 04/13/2020      Assessment & Plan:   Problem List Items Addressed This Visit      Respiratory   Seasonal allergic rhinitis due to pollen   Relevant Medications   fluticasone (FLONASE) 50 MCG/ACT nasal spray     Other   Tobacco abuse -  Primary   Dysthymia   Relevant Medications   PARoxetine (PAXIL) 20 MG tablet      Meds ordered this encounter  Medications  . PARoxetine (PAXIL) 20 MG tablet    Sig: Take 1 tablet (20 mg total) by mouth daily.    Dispense:  90 tablet    Refill:  1  . fluticasone (FLONASE) 50 MCG/ACT nasal spray    Sig: Place 2 sprays into both nostrils daily.    Dispense:  16 g    Refill:  6    Follow-up: Return in about 6 weeks (around 04/01/2021).   Have increased Paxil to 20 mg daily.  He will start Flonase 2 sprays each nostril daily.  Warned him that it takes a week or 2 for the medicine to make a difference for his symptoms and to please stick with its usage. Libby Maw, MD

## 2021-03-14 ENCOUNTER — Other Ambulatory Visit (HOSPITAL_COMMUNITY): Payer: Self-pay

## 2021-03-14 ENCOUNTER — Other Ambulatory Visit (HOSPITAL_COMMUNITY): Payer: Self-pay | Admitting: Neurological Surgery

## 2021-03-14 ENCOUNTER — Other Ambulatory Visit: Payer: Self-pay | Admitting: Neurological Surgery

## 2021-03-14 DIAGNOSIS — M47812 Spondylosis without myelopathy or radiculopathy, cervical region: Secondary | ICD-10-CM | POA: Diagnosis not present

## 2021-03-14 DIAGNOSIS — R200 Anesthesia of skin: Secondary | ICD-10-CM | POA: Diagnosis not present

## 2021-03-14 MED ORDER — DIAZEPAM 5 MG PO TABS
ORAL_TABLET | ORAL | 0 refills | Status: DC
  Filled 2021-03-14: qty 1, 1d supply, fill #0

## 2021-03-15 ENCOUNTER — Other Ambulatory Visit (HOSPITAL_COMMUNITY): Payer: Self-pay

## 2021-03-16 ENCOUNTER — Other Ambulatory Visit (HOSPITAL_COMMUNITY): Payer: Self-pay

## 2021-03-18 ENCOUNTER — Other Ambulatory Visit (HOSPITAL_COMMUNITY): Payer: Self-pay

## 2021-03-23 ENCOUNTER — Other Ambulatory Visit: Payer: Self-pay

## 2021-03-23 ENCOUNTER — Ambulatory Visit (HOSPITAL_COMMUNITY)
Admission: RE | Admit: 2021-03-23 | Discharge: 2021-03-23 | Disposition: A | Discharge status: Home / Self Care | Payer: 59 | Source: Ambulatory Visit | Attending: Neurological Surgery | Admitting: Neurological Surgery

## 2021-03-23 DIAGNOSIS — M4802 Spinal stenosis, cervical region: Secondary | ICD-10-CM | POA: Diagnosis not present

## 2021-03-23 DIAGNOSIS — M5021 Other cervical disc displacement,  high cervical region: Secondary | ICD-10-CM | POA: Diagnosis not present

## 2021-03-23 DIAGNOSIS — M47812 Spondylosis without myelopathy or radiculopathy, cervical region: Secondary | ICD-10-CM | POA: Insufficient documentation

## 2021-03-23 DIAGNOSIS — M50221 Other cervical disc displacement at C4-C5 level: Secondary | ICD-10-CM | POA: Diagnosis not present

## 2021-03-23 DIAGNOSIS — M50223 Other cervical disc displacement at C6-C7 level: Secondary | ICD-10-CM | POA: Diagnosis not present

## 2021-03-23 IMAGING — MR MR CERVICAL SPINE W/O CM
6 of 9 series · 26 of 48 positions shown · non-contrast
Comparison: Prior MRI from [DATE]

CLINICAL DATA: Initial evaluation for progressive gait imbalance.

EXAM:
MRI CERVICAL SPINE WITHOUT CONTRAST
TECHNIQUE: Multiplanar, multisequence MR imaging of the cervical spine was
performed. No intravenous contrast was administered.

[Series 5: T1 · sagittal · 3.0mm · 0.69mm/px · 2 of 15 slices shown (1 of 2)]
[im 1/15]
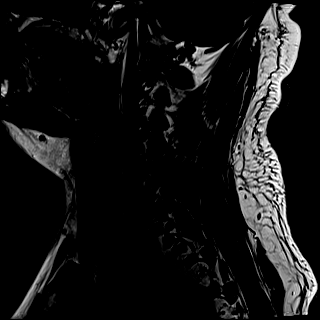
[im 15/15]
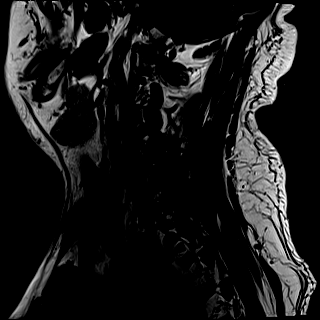

[Series 6: T2 · sagittal · 3.0mm · 0.69mm/px · 2 of 15 slices shown (1 of 2)]
[im 1/15]
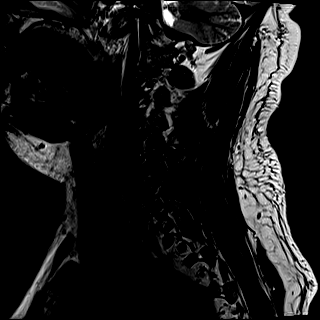
[im 15/15]
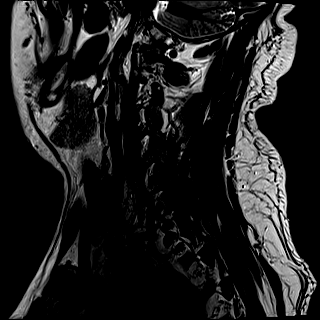

[Series 7: T2 · axial · 3.0mm · 0.70mm/px · z∈[-46,+108]mm · 7 of 45 slices shown (2 of 2)]
[im 1/45]
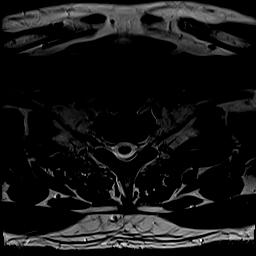
[im 8/45]
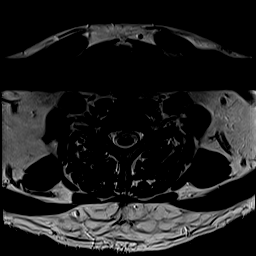
[im 15/45]
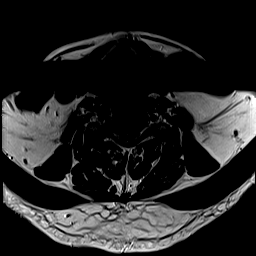
[im 23/45]
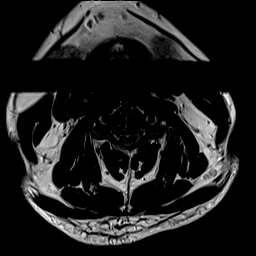
[im 30/45]
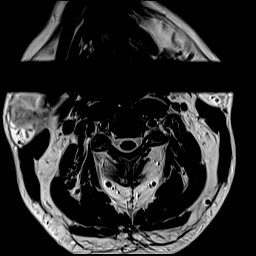
[im 37/45]
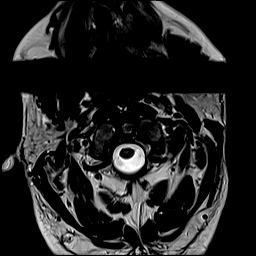
[im 45/45]
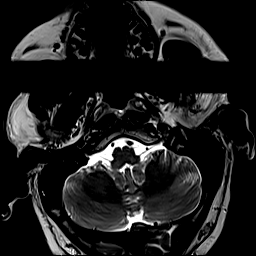

[Series 8: STIR · sagittal · 3.0mm · 0.86mm/px · 2 of 15 slices shown]
[im 1/15]
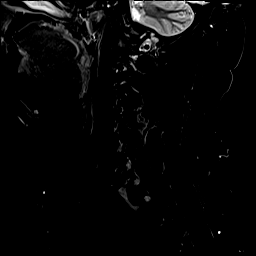
[im 15/15]
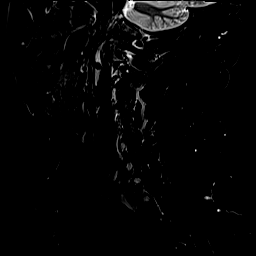

[Series 9: GRE · axial · 3.0mm · 0.35mm/px · z∈[-46,+80]mm · 6 of 45 slices shown]
[im 1/45]
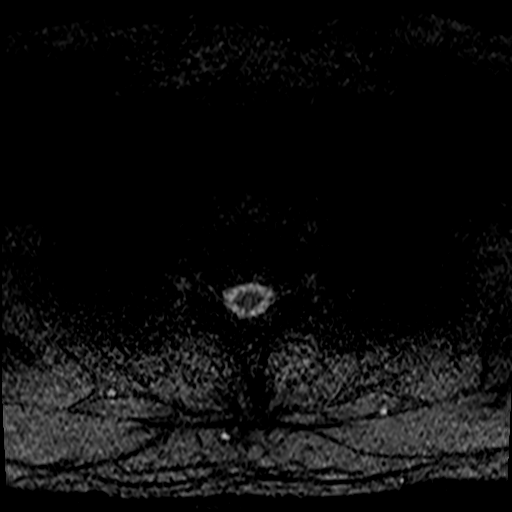
[im 8/45]
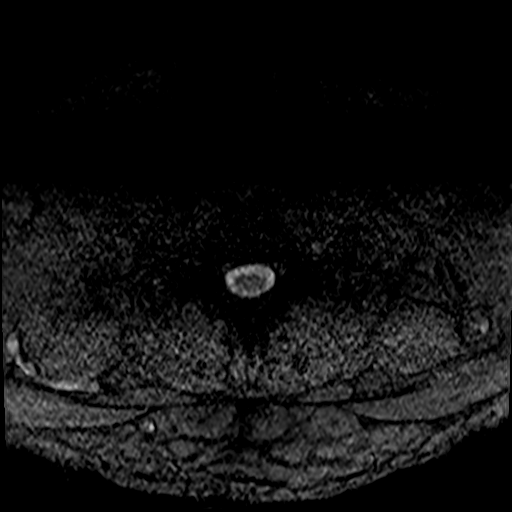
[im 15/45]
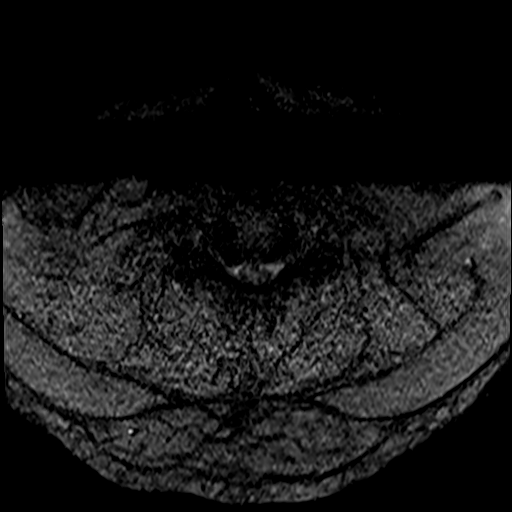
[im 23/45]
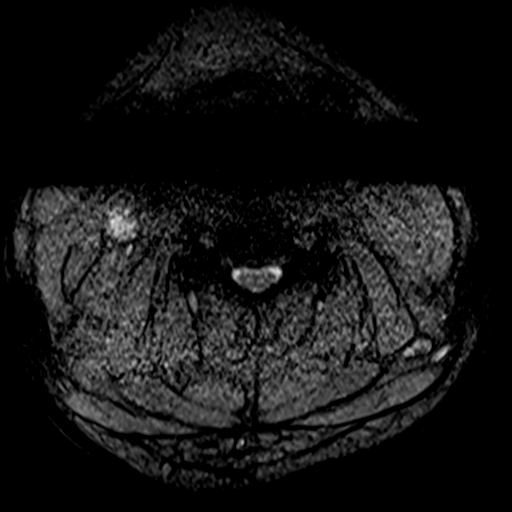
[im 30/45]
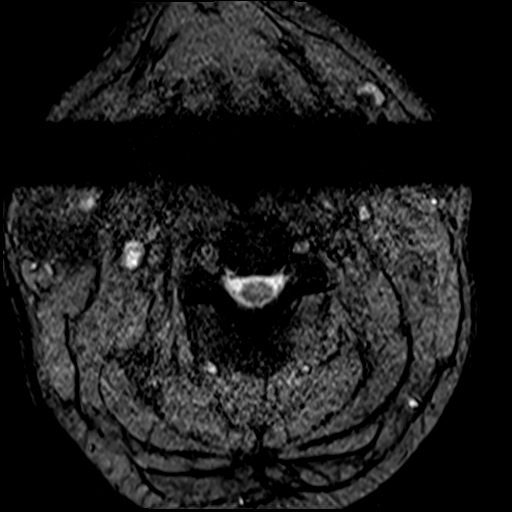
[im 37/45]
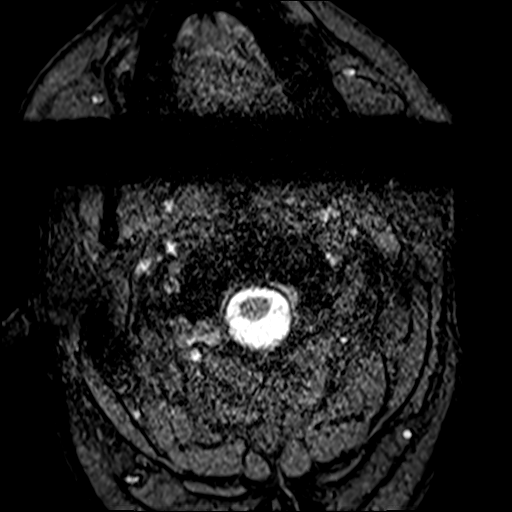

[Series 10: T1 · axial · 3.0mm · 0.35mm/px · z∈[-46,+108]mm · 7 of 45 slices shown (2 of 2)]
[im 1/45]
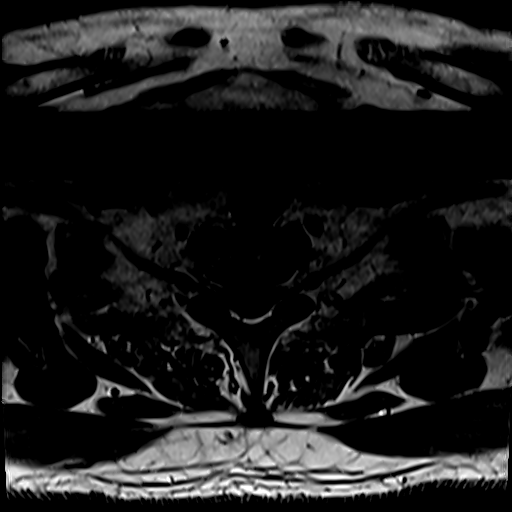
[im 8/45]
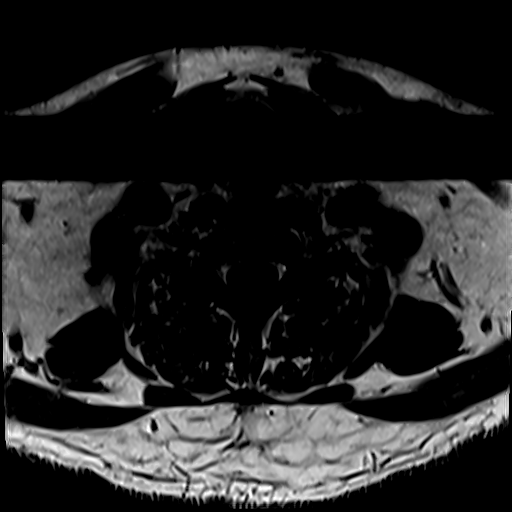
[im 15/45]
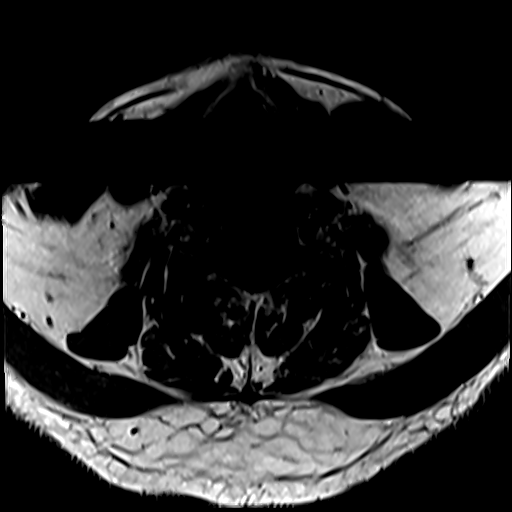
[im 23/45]
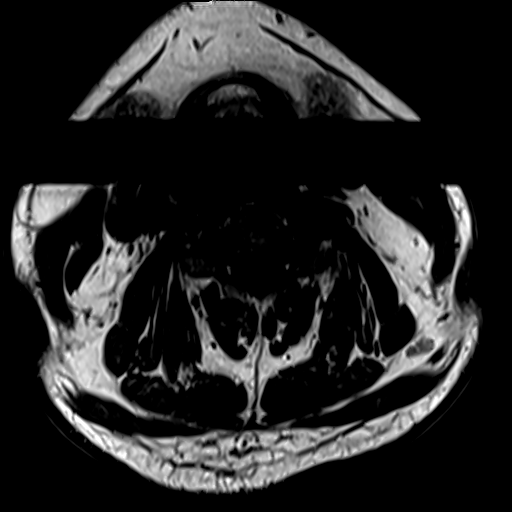
[im 30/45]
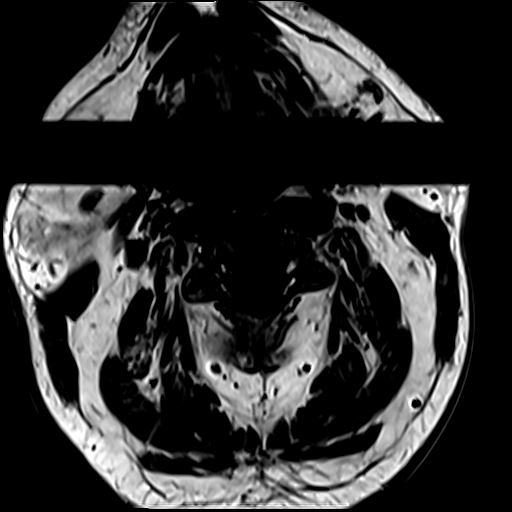
[im 37/45]
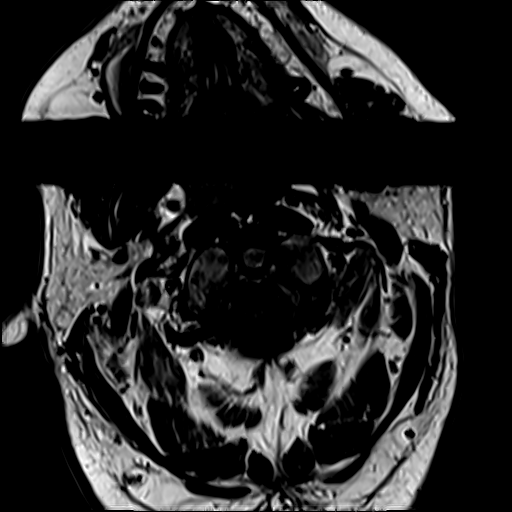
[im 45/45]
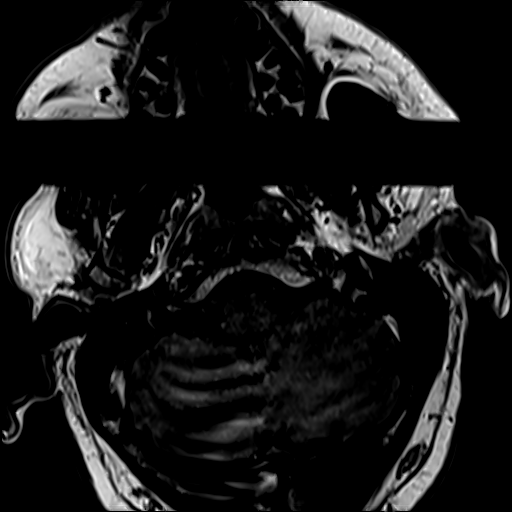

[26 of 48 positions shown; findings below may reference images not displayed]

FINDINGS: Alignment: Straightening of the normal cervical lordosis. No
listhesis.

Vertebrae: Vertebral body height maintained without acute or chronic
fracture. Bone marrow signal intensity within normal limits. No
discrete or worrisome osseous lesions. No abnormal marrow edema.

Cord: Patchy cord signal abnormality seen within the right greater
than left cervical spinal cord at the level of C3-4 (series 7, image
22), most consistent with compressive myelomalacia. Signal intensity
within the cervical cord otherwise within normal limits.

Posterior Fossa, vertebral arteries, paraspinal tissues: Cerebellar
tonsils extend through the foramen magnum by up to approximately 7
mm, suggesting a Chiari 1 malformation. No significant stenosis
about the craniocervical junction. Paraspinous and prevertebral soft
tissues within normal limits. Normal flow voids seen within the
vertebral arteries bilaterally.

Disc levels:

C2-C3: Minimal disc bulge with reactive endplate spurring. No spinal
stenosis. Foramina remain patent.

C3-C4: Degenerative intervertebral disc space narrowing with diffuse
disc osteophyte complex. Broad posterior component flattens and
effaces the ventral thecal sac. Secondary cord flattening with cord
signal changes, consistent with chronic myelomalacia. Moderate to
severe spinal stenosis with the thecal sac measuring 6-7 mm in AP
diameter. Moderate left worse than right C4 foraminal stenosis.

C4-C5: Mild disc bulge with uncovertebral hypertrophy. No
significant spinal stenosis. Superimposed mild right-sided facet
degeneration. Mild to moderate right C5 foraminal stenosis. Left
neural foramina remains patent.

C5-C6: Degenerative intervertebral disc space narrowing with diffuse
disc osteophyte complex. Broad posterior component flattens and
effaces the ventral thecal sac, slightly asymmetric to the right.
Moderate spinal stenosis with minimal cord flattening, but no cord
signal changes. Moderate right with mild left C6 foraminal
narrowing.

C6-C7: Disc bulge with bilateral uncovertebral hypertrophy.
Flattening of the ventral thecal sac without significant spinal
stenosis. Mild to moderate left C7 foraminal stenosis. Right neural
foramina remains patent.

C7-T1:  Unremarkable.

Visualized upper thoracic spine demonstrates no significant finding.
IMPRESSION: 1. Degenerative spondylosis at C3-4 with resultant moderate to
severe spinal stenosis, with moderate left worse than right C4
foraminal narrowing. Patchy cord signal abnormality at this level
consistent with compressive myelopathy.
2. Degenerative disc osteophyte at C5-6 with resultant moderate
spinal stenosis, with moderate right C6 foraminal narrowing.
3. Mild-to-moderate right C5 and left C7 related to disc bulge and
uncovertebral spurring.
4. Cerebellar tonsils extend through the foramen magnum by up to 7
mm, suggesting a Chiari 1 malformation.

## 2021-04-01 ENCOUNTER — Ambulatory Visit: Payer: 59 | Admitting: Family Medicine

## 2021-04-02 ENCOUNTER — Other Ambulatory Visit: Payer: Self-pay | Admitting: Neurological Surgery

## 2021-04-05 ENCOUNTER — Other Ambulatory Visit (HOSPITAL_COMMUNITY)
Admission: RE | Admit: 2021-04-05 | Discharge: 2021-04-05 | Disposition: A | Discharge status: Home / Self Care | Payer: 59 | Source: Ambulatory Visit | Attending: Neurological Surgery | Admitting: Neurological Surgery

## 2021-04-05 ENCOUNTER — Other Ambulatory Visit (HOSPITAL_COMMUNITY): Payer: 59

## 2021-04-05 DIAGNOSIS — Z01812 Encounter for preprocedural laboratory examination: Secondary | ICD-10-CM | POA: Insufficient documentation

## 2021-04-05 DIAGNOSIS — Z20822 Contact with and (suspected) exposure to covid-19: Secondary | ICD-10-CM | POA: Insufficient documentation

## 2021-04-05 LAB — SARS CORONAVIRUS 2 (TAT 6-24 HRS): SARS Coronavirus 2: NEGATIVE

## 2021-04-12 ENCOUNTER — Other Ambulatory Visit (HOSPITAL_COMMUNITY)
Admission: RE | Admit: 2021-04-12 | Discharge: 2021-04-12 | Disposition: A | Discharge status: Home / Self Care | Payer: 59 | Source: Ambulatory Visit | Attending: Neurological Surgery | Admitting: Neurological Surgery

## 2021-04-12 ENCOUNTER — Other Ambulatory Visit: Payer: Self-pay

## 2021-04-12 ENCOUNTER — Encounter (HOSPITAL_COMMUNITY): Payer: Self-pay | Admitting: Neurological Surgery

## 2021-04-12 DIAGNOSIS — Z20822 Contact with and (suspected) exposure to covid-19: Secondary | ICD-10-CM | POA: Diagnosis not present

## 2021-04-12 DIAGNOSIS — Z01812 Encounter for preprocedural laboratory examination: Secondary | ICD-10-CM | POA: Diagnosis not present

## 2021-04-12 LAB — SARS CORONAVIRUS 2 (TAT 6-24 HRS): SARS Coronavirus 2: NEGATIVE

## 2021-04-15 NOTE — Progress Notes (Signed)
Spoke with patient directly on the phone. He voiced understanding of new arrival time of 0530.

## 2021-04-16 ENCOUNTER — Ambulatory Visit (HOSPITAL_COMMUNITY): Payer: 59

## 2021-04-16 ENCOUNTER — Observation Stay (HOSPITAL_COMMUNITY)
Admission: RE | Admit: 2021-04-16 | Discharge: 2021-04-17 | Disposition: A | Discharge status: Home / Self Care | Payer: 59 | Attending: Neurological Surgery | Admitting: Neurological Surgery

## 2021-04-16 ENCOUNTER — Ambulatory Visit (HOSPITAL_COMMUNITY): Payer: 59 | Admitting: Anesthesiology

## 2021-04-16 ENCOUNTER — Encounter (HOSPITAL_COMMUNITY)
Admission: RE | Disposition: A | Discharge status: Home / Self Care | Payer: Self-pay | Source: Home / Self Care | Attending: Neurological Surgery

## 2021-04-16 ENCOUNTER — Encounter (HOSPITAL_COMMUNITY): Payer: Self-pay | Admitting: Neurological Surgery

## 2021-04-16 ENCOUNTER — Other Ambulatory Visit: Payer: Self-pay

## 2021-04-16 DIAGNOSIS — D696 Thrombocytopenia, unspecified: Secondary | ICD-10-CM | POA: Diagnosis not present

## 2021-04-16 DIAGNOSIS — J45909 Unspecified asthma, uncomplicated: Secondary | ICD-10-CM | POA: Diagnosis not present

## 2021-04-16 DIAGNOSIS — J301 Allergic rhinitis due to pollen: Secondary | ICD-10-CM | POA: Diagnosis not present

## 2021-04-16 DIAGNOSIS — G959 Disease of spinal cord, unspecified: Secondary | ICD-10-CM | POA: Diagnosis present

## 2021-04-16 DIAGNOSIS — Z981 Arthrodesis status: Secondary | ICD-10-CM | POA: Diagnosis not present

## 2021-04-16 DIAGNOSIS — Z419 Encounter for procedure for purposes other than remedying health state, unspecified: Secondary | ICD-10-CM

## 2021-04-16 DIAGNOSIS — M4802 Spinal stenosis, cervical region: Secondary | ICD-10-CM | POA: Diagnosis not present

## 2021-04-16 DIAGNOSIS — M4712 Other spondylosis with myelopathy, cervical region: Principal | ICD-10-CM | POA: Insufficient documentation

## 2021-04-16 DIAGNOSIS — M4322 Fusion of spine, cervical region: Secondary | ICD-10-CM | POA: Diagnosis not present

## 2021-04-16 DIAGNOSIS — F17210 Nicotine dependence, cigarettes, uncomplicated: Secondary | ICD-10-CM | POA: Diagnosis not present

## 2021-04-16 DIAGNOSIS — F418 Other specified anxiety disorders: Secondary | ICD-10-CM | POA: Diagnosis not present

## 2021-04-16 HISTORY — PX: ANTERIOR CERVICAL DECOMP/DISCECTOMY FUSION: SHX1161

## 2021-04-16 HISTORY — DX: Anxiety disorder, unspecified: F41.9

## 2021-04-16 LAB — CBC WITH DIFFERENTIAL/PLATELET
Abs Immature Granulocytes: 0.02 10*3/uL (ref 0.00–0.07)
Basophils Absolute: 0 10*3/uL (ref 0.0–0.1)
Basophils Relative: 1 %
Eosinophils Absolute: 0.2 10*3/uL (ref 0.0–0.5)
Eosinophils Relative: 3 %
HCT: 47.8 % (ref 39.0–52.0)
Hemoglobin: 16.3 g/dL (ref 13.0–17.0)
Immature Granulocytes: 0 %
Lymphocytes Relative: 23 %
Lymphs Abs: 1.8 10*3/uL (ref 0.7–4.0)
MCH: 28.4 pg (ref 26.0–34.0)
MCHC: 34.1 g/dL (ref 30.0–36.0)
MCV: 83.3 fL (ref 80.0–100.0)
Monocytes Absolute: 0.5 10*3/uL (ref 0.1–1.0)
Monocytes Relative: 7 %
Neutro Abs: 5.1 10*3/uL (ref 1.7–7.7)
Neutrophils Relative %: 66 %
Platelets: 128 10*3/uL — ABNORMAL LOW (ref 150–400)
RBC: 5.74 MIL/uL (ref 4.22–5.81)
RDW: 13.6 % (ref 11.5–15.5)
WBC: 7.7 10*3/uL (ref 4.0–10.5)
nRBC: 0 % (ref 0.0–0.2)

## 2021-04-16 LAB — BASIC METABOLIC PANEL
Anion gap: 9 (ref 5–15)
BUN: 15 mg/dL (ref 6–20)
CO2: 24 mmol/L (ref 22–32)
Calcium: 9 mg/dL (ref 8.9–10.3)
Chloride: 101 mmol/L (ref 98–111)
Creatinine, Ser: 0.7 mg/dL (ref 0.61–1.24)
GFR, Estimated: >60 mL/min (ref >60)
Glucose, Bld: 129 mg/dL — ABNORMAL HIGH (ref 70–99)
Potassium: 4 mmol/L (ref 3.5–5.1)
Sodium: 134 mmol/L — ABNORMAL LOW (ref 135–145)

## 2021-04-16 LAB — PROTIME-INR
INR: 1 (ref 0.8–1.2)
Prothrombin Time: 13 seconds (ref 11.4–15.2)

## 2021-04-16 LAB — TYPE AND SCREEN
ABO/RH(D): A POS
Antibody Screen: NEGATIVE

## 2021-04-16 LAB — SURGICAL PCR SCREEN
MRSA, PCR: NEGATIVE
Staphylococcus aureus: NEGATIVE

## 2021-04-16 LAB — ABO/RH: ABO/RH(D): A POS

## 2021-04-16 IMAGING — RF DG C-ARM 1-60 MIN
1 series · 2 of 2 positions shown · non-contrast
Comparison: Cervical spine MRI [DATE].

CLINICAL DATA: Surgery, elective. Additional history provided:
Cervical 3-4 anterior cervical decompression/discectomy/fusion.
Provided fluoroscopy time 11 seconds (1.45 mGy).

EXAM:
CERVICAL SPINE - 2-3 VIEW; DG C-ARM 1-60 MIN

[Series 1: run · 2 of 2 slices shown]
[im 1/2]
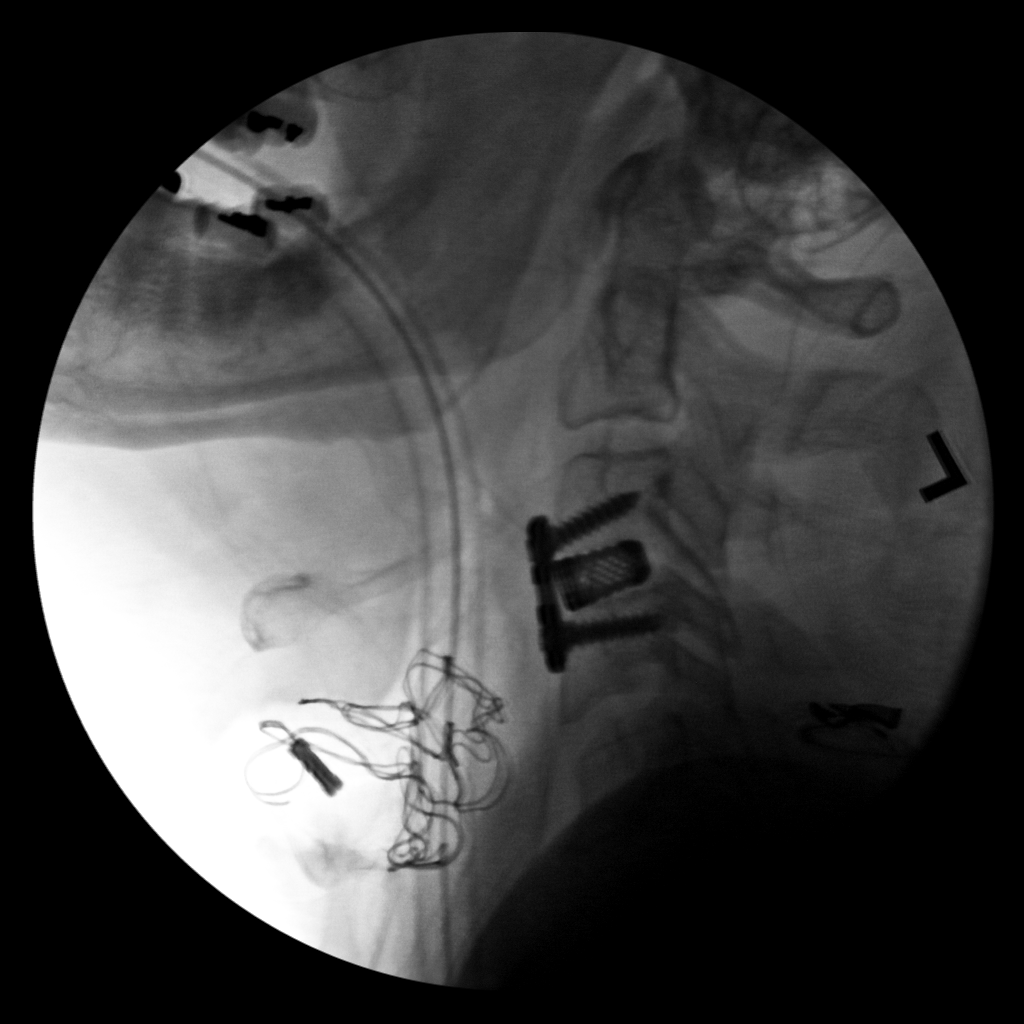
[im 2/2]
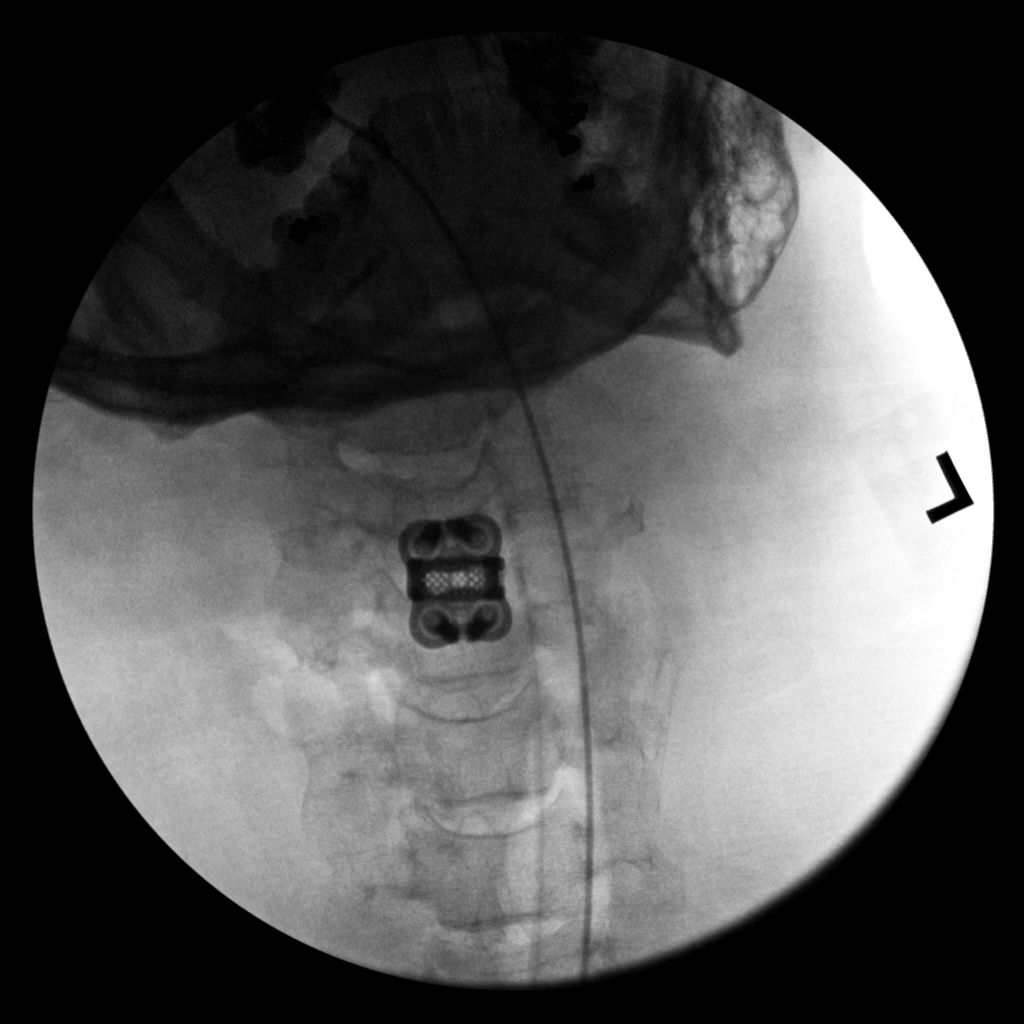

[2 of 2 positions shown; findings below may reference images not displayed]

FINDINGS: PA and lateral view intraoperative fluoroscopic images of the
cervical spine are submitted, 2 images total. On the provided
images, there are findings of interval C3-C4 ACDF (ventral plate and
screws as well as interbody device). On the lateral image,
curvilinear hyperdensity projects ventral to the cervical spine,
likely reflecting a surgical sponge/packing material. This is no
longer appreciated on the subsequent PA fluoroscopic image.
Partially visualized support tubes.
IMPRESSION: Two intraoperative fluoroscopic images of the cervical spine from
C3-C4 ACDF, as described.

## 2021-04-16 IMAGING — RF DG CERVICAL SPINE 2 OR 3 VIEWS
1 series · 2 of 2 positions shown · non-contrast
Comparison: Cervical spine MRI [DATE].

CLINICAL DATA: Surgery, elective. Additional history provided:
Cervical 3-4 anterior cervical decompression/discectomy/fusion.
Provided fluoroscopy time 11 seconds (1.45 mGy).

EXAM:
CERVICAL SPINE - 2-3 VIEW; DG C-ARM 1-60 MIN

[Series 1: run · 2 of 2 slices shown]
[im 1/2]
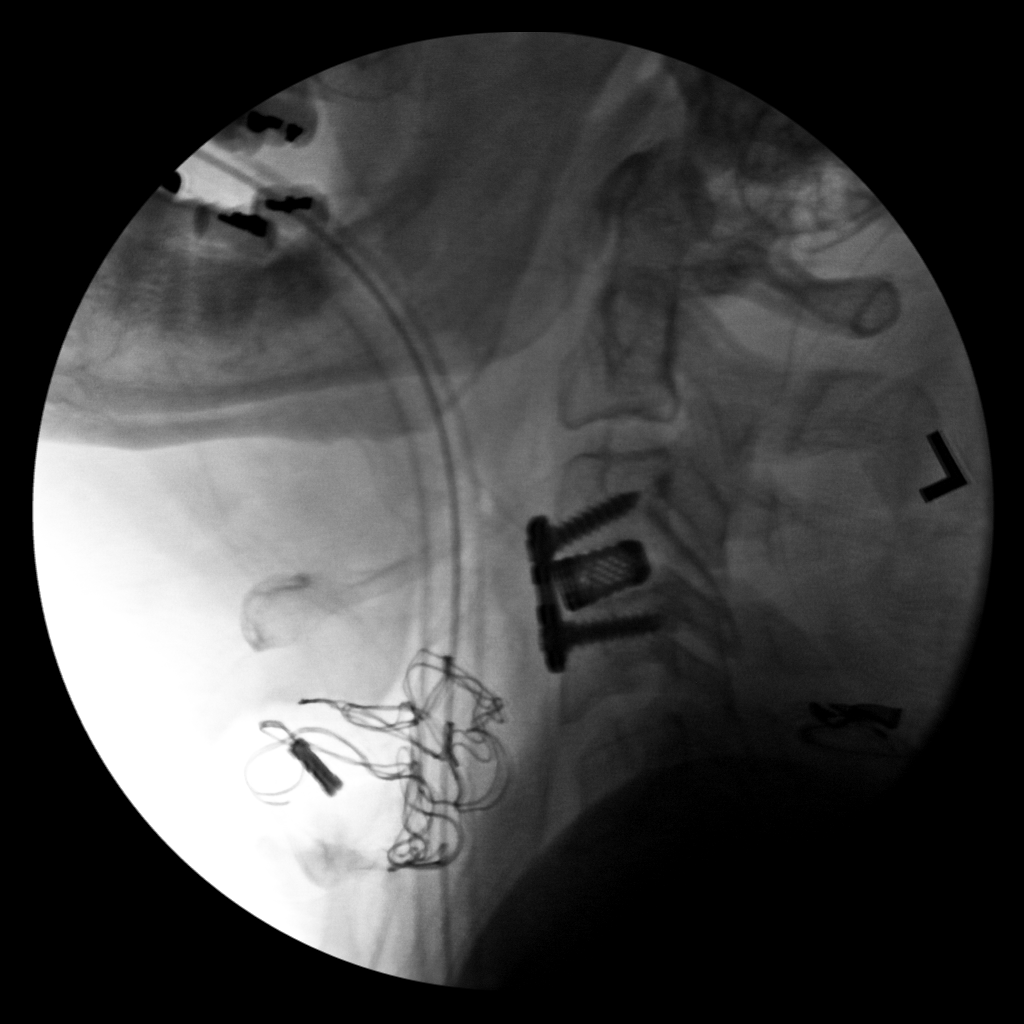
[im 2/2]
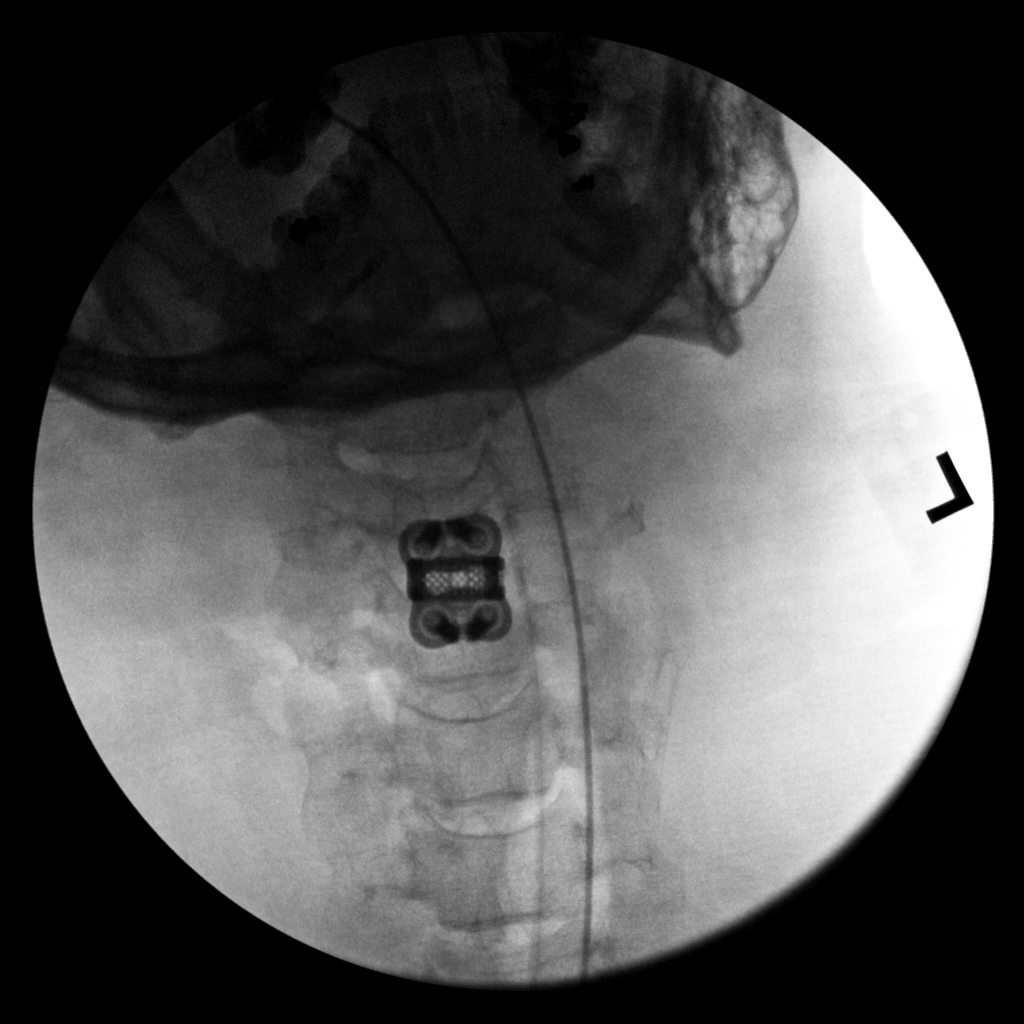

[2 of 2 positions shown; findings below may reference images not displayed]

FINDINGS: PA and lateral view intraoperative fluoroscopic images of the
cervical spine are submitted, 2 images total. On the provided
images, there are findings of interval C3-C4 ACDF (ventral plate and
screws as well as interbody device). On the lateral image,
curvilinear hyperdensity projects ventral to the cervical spine,
likely reflecting a surgical sponge/packing material. This is no
longer appreciated on the subsequent PA fluoroscopic image.
Partially visualized support tubes.
IMPRESSION: Two intraoperative fluoroscopic images of the cervical spine from
C3-C4 ACDF, as described.

## 2021-04-16 SURGERY — ANTERIOR CERVICAL DECOMPRESSION/DISCECTOMY FUSION 1 LEVEL
Anesthesia: General

## 2021-04-16 MED ORDER — ALUM & MAG HYDROXIDE-SIMETH 200-200-20 MG/5ML PO SUSP: 30.0000 mL | Freq: Four times a day (QID) | ORAL | Status: DC | PRN

## 2021-04-16 MED ORDER — CHLORHEXIDINE GLUCONATE 0.12 % MT SOLN
15.0000 mL | Freq: Once | OROMUCOSAL | Status: AC
Start: 2021-04-16 — End: 2021-04-16
  Administered 2021-04-16: 15 mL via OROMUCOSAL
  Filled 2021-04-16: qty 15

## 2021-04-16 MED ORDER — THROMBIN 5000 UNITS EX SOLR
CUTANEOUS | Status: AC
  Filled 2021-04-16: qty 5000

## 2021-04-16 MED ORDER — PROPOFOL 10 MG/ML IV BOLUS
INTRAVENOUS | Status: AC
  Filled 2021-04-16: qty 20

## 2021-04-16 MED ORDER — SODIUM CHLORIDE 0.9 % IV SOLN: 250.0000 mL | INTRAVENOUS | Status: DC

## 2021-04-16 MED ORDER — ONDANSETRON HCL 4 MG/2ML IJ SOLN
INTRAMUSCULAR | Status: AC
  Filled 2021-04-16: qty 2

## 2021-04-16 MED ORDER — SODIUM CHLORIDE 0.9 % IV SOLN: INTRAVENOUS | Status: DC

## 2021-04-16 MED ORDER — PROPOFOL 500 MG/50ML IV EMUL
INTRAVENOUS | Status: DC | PRN
  Administered 2021-04-16: 50 ug/kg/min via INTRAVENOUS

## 2021-04-16 MED ORDER — TRIAMCINOLONE ACETONIDE 40 MG/ML IJ SUSP
INTRAMUSCULAR | Status: AC
  Filled 2021-04-16: qty 5

## 2021-04-16 MED ORDER — MIDAZOLAM HCL 5 MG/5ML IJ SOLN
INTRAMUSCULAR | Status: DC | PRN
  Administered 2021-04-16: 2 mg via INTRAVENOUS

## 2021-04-16 MED ORDER — ACETAMINOPHEN 650 MG RE SUPP
650.0000 mg | RECTAL | Status: DC | PRN
Start: 2021-04-16 — End: 2021-04-17

## 2021-04-16 MED ORDER — FENTANYL CITRATE (PF) 250 MCG/5ML IJ SOLN
INTRAMUSCULAR | Status: AC
  Filled 2021-04-16: qty 5

## 2021-04-16 MED ORDER — LACTATED RINGERS IV SOLN: INTRAVENOUS | Status: DC

## 2021-04-16 MED ORDER — PAROXETINE HCL 20 MG PO TABS
20.0000 mg | ORAL_TABLET | Freq: Every day | ORAL | Status: DC
  Administered 2021-04-16 – 2021-04-17 (×2): 20 mg via ORAL
  Filled 2021-04-16 (×2): qty 1

## 2021-04-16 MED ORDER — ROCURONIUM BROMIDE 10 MG/ML (PF) SYRINGE
PREFILLED_SYRINGE | INTRAVENOUS | Status: AC
  Filled 2021-04-16: qty 10

## 2021-04-16 MED ORDER — OXYCODONE HCL 5 MG/5ML PO SOLN: 5.0000 mg | Freq: Once | ORAL | Status: DC | PRN

## 2021-04-16 MED ORDER — SUGAMMADEX SODIUM 200 MG/2ML IV SOLN
INTRAVENOUS | Status: DC | PRN
  Administered 2021-04-16: 300 mg via INTRAVENOUS

## 2021-04-16 MED ORDER — ACETAMINOPHEN 325 MG PO TABS
650.0000 mg | ORAL_TABLET | ORAL | Status: DC | PRN
  Administered 2021-04-17: 650 mg via ORAL
  Filled 2021-04-16: qty 2

## 2021-04-16 MED ORDER — OXYCODONE HCL 5 MG PO TABS
10.0000 mg | ORAL_TABLET | ORAL | Status: DC | PRN
  Administered 2021-04-16 – 2021-04-17 (×5): 10 mg via ORAL
  Filled 2021-04-16 (×5): qty 2

## 2021-04-16 MED ORDER — ACETAMINOPHEN 10 MG/ML IV SOLN: 1000.0000 mg | Freq: Once | INTRAVENOUS | Status: DC | PRN

## 2021-04-16 MED ORDER — AMISULPRIDE (ANTIEMETIC) 5 MG/2ML IV SOLN
INTRAVENOUS | Status: AC
  Filled 2021-04-16: qty 4

## 2021-04-16 MED ORDER — PROPOFOL 10 MG/ML IV BOLUS
INTRAVENOUS | Status: DC | PRN
  Administered 2021-04-16: 150 mg via INTRAVENOUS

## 2021-04-16 MED ORDER — ROCURONIUM BROMIDE 10 MG/ML (PF) SYRINGE
PREFILLED_SYRINGE | INTRAVENOUS | Status: DC | PRN
  Administered 2021-04-16: 60 mg via INTRAVENOUS
  Administered 2021-04-16: 20 mg via INTRAVENOUS

## 2021-04-16 MED ORDER — FENTANYL CITRATE (PF) 100 MCG/2ML IJ SOLN
INTRAMUSCULAR | Status: AC
  Filled 2021-04-16: qty 2

## 2021-04-16 MED ORDER — FENTANYL CITRATE (PF) 250 MCG/5ML IJ SOLN
INTRAMUSCULAR | Status: DC | PRN
  Administered 2021-04-16 (×3): 50 ug via INTRAVENOUS
  Administered 2021-04-16: 100 ug via INTRAVENOUS

## 2021-04-16 MED ORDER — MENTHOL 3 MG MT LOZG
1.0000 | LOZENGE | OROMUCOSAL | Status: DC | PRN
  Filled 2021-04-16: qty 9

## 2021-04-16 MED ORDER — CEFAZOLIN SODIUM-DEXTROSE 2-4 GM/100ML-% IV SOLN
2.0000 g | INTRAVENOUS | Status: AC
  Administered 2021-04-16: 2 g via INTRAVENOUS
  Filled 2021-04-16: qty 100

## 2021-04-16 MED ORDER — DEXAMETHASONE SODIUM PHOSPHATE 10 MG/ML IJ SOLN
INTRAMUSCULAR | Status: AC
  Filled 2021-04-16: qty 1

## 2021-04-16 MED ORDER — KETOROLAC TROMETHAMINE 15 MG/ML IJ SOLN
15.0000 mg | Freq: Four times a day (QID) | INTRAMUSCULAR | Status: AC
  Administered 2021-04-16 – 2021-04-17 (×4): 15 mg via INTRAVENOUS
  Filled 2021-04-16 (×4): qty 1

## 2021-04-16 MED ORDER — HYDROCODONE-ACETAMINOPHEN 10-325 MG PO TABS
1.0000 | ORAL_TABLET | ORAL | Status: DC | PRN
  Administered 2021-04-16: 1 via ORAL
  Filled 2021-04-16: qty 1

## 2021-04-16 MED ORDER — FENTANYL CITRATE (PF) 100 MCG/2ML IJ SOLN
25.0000 ug | INTRAMUSCULAR | Status: DC | PRN
  Administered 2021-04-16 (×2): 50 ug via INTRAVENOUS

## 2021-04-16 MED ORDER — SODIUM CHLORIDE 0.9% FLUSH: 3.0000 mL | INTRAVENOUS | Status: DC | PRN

## 2021-04-16 MED ORDER — AMISULPRIDE (ANTIEMETIC) 5 MG/2ML IV SOLN
10.0000 mg | Freq: Once | INTRAVENOUS | Status: AC | PRN
  Administered 2021-04-16: 10 mg via INTRAVENOUS

## 2021-04-16 MED ORDER — CHLORHEXIDINE GLUCONATE CLOTH 2 % EX PADS: 6.0000 | MEDICATED_PAD | Freq: Once | CUTANEOUS | Status: DC

## 2021-04-16 MED ORDER — PROMETHAZINE HCL 25 MG/ML IJ SOLN
6.2500 mg | INTRAMUSCULAR | Status: DC | PRN
Start: 2021-04-16 — End: 2021-04-16

## 2021-04-16 MED ORDER — DOCUSATE SODIUM 100 MG PO CAPS
100.0000 mg | ORAL_CAPSULE | Freq: Two times a day (BID) | ORAL | Status: DC
  Administered 2021-04-16 – 2021-04-17 (×3): 100 mg via ORAL
  Filled 2021-04-16 (×3): qty 1

## 2021-04-16 MED ORDER — DEXAMETHASONE SODIUM PHOSPHATE 10 MG/ML IJ SOLN
INTRAMUSCULAR | Status: DC | PRN
  Administered 2021-04-16: 10 mg via INTRAVENOUS

## 2021-04-16 MED ORDER — MIDAZOLAM HCL 2 MG/2ML IJ SOLN
INTRAMUSCULAR | Status: AC
  Filled 2021-04-16: qty 2

## 2021-04-16 MED ORDER — METHOCARBAMOL 500 MG PO TABS
500.0000 mg | ORAL_TABLET | Freq: Four times a day (QID) | ORAL | Status: DC | PRN
  Administered 2021-04-16 – 2021-04-17 (×3): 500 mg via ORAL
  Filled 2021-04-16 (×3): qty 1

## 2021-04-16 MED ORDER — SODIUM CHLORIDE 0.9% FLUSH
3.0000 mL | Freq: Two times a day (BID) | INTRAVENOUS | Status: DC
  Administered 2021-04-16: 3 mL via INTRAVENOUS

## 2021-04-16 MED ORDER — HYDROMORPHONE HCL 1 MG/ML IJ SOLN
0.5000 mg | INTRAMUSCULAR | Status: DC | PRN
  Administered 2021-04-16: 0.5 mg via INTRAVENOUS
  Filled 2021-04-16: qty 0.5

## 2021-04-16 MED ORDER — ORAL CARE MOUTH RINSE: 15.0000 mL | Freq: Once | OROMUCOSAL | Status: AC

## 2021-04-16 MED ORDER — GABAPENTIN 300 MG PO CAPS
300.0000 mg | ORAL_CAPSULE | Freq: Three times a day (TID) | ORAL | Status: DC
  Administered 2021-04-16 – 2021-04-17 (×3): 300 mg via ORAL
  Filled 2021-04-16 (×3): qty 1

## 2021-04-16 MED ORDER — LIDOCAINE 2% (20 MG/ML) 5 ML SYRINGE
INTRAMUSCULAR | Status: DC | PRN
  Administered 2021-04-16: 100 mg via INTRAVENOUS

## 2021-04-16 MED ORDER — FLUTICASONE PROPIONATE 50 MCG/ACT NA SUSP
2.0000 | Freq: Every day | NASAL | Status: DC | PRN
  Filled 2021-04-16: qty 16

## 2021-04-16 MED ORDER — ONDANSETRON HCL 4 MG/2ML IJ SOLN
INTRAMUSCULAR | Status: DC | PRN
  Administered 2021-04-16: 4 mg via INTRAVENOUS

## 2021-04-16 MED ORDER — THROMBIN 5000 UNITS EX SOLR
OROMUCOSAL | Status: DC | PRN
  Administered 2021-04-16: 5 mL via TOPICAL

## 2021-04-16 MED ORDER — ONDANSETRON HCL 4 MG/2ML IJ SOLN: 4.0000 mg | Freq: Four times a day (QID) | INTRAMUSCULAR | Status: DC | PRN

## 2021-04-16 MED ORDER — HYDROXYZINE HCL 50 MG/ML IM SOLN: 50.0000 mg | Freq: Four times a day (QID) | INTRAMUSCULAR | Status: DC | PRN

## 2021-04-16 MED ORDER — 0.9 % SODIUM CHLORIDE (POUR BTL) OPTIME
TOPICAL | Status: DC | PRN
  Administered 2021-04-16: 1000 mL

## 2021-04-16 MED ORDER — PHENOL 1.4 % MT LIQD
1.0000 | OROMUCOSAL | Status: DC | PRN
  Administered 2021-04-16: 1 via OROMUCOSAL
  Filled 2021-04-16: qty 177

## 2021-04-16 MED ORDER — ONDANSETRON HCL 4 MG PO TABS: 4.0000 mg | ORAL_TABLET | Freq: Four times a day (QID) | ORAL | Status: DC | PRN

## 2021-04-16 MED ORDER — CEFAZOLIN SODIUM-DEXTROSE 2-4 GM/100ML-% IV SOLN
2.0000 g | Freq: Three times a day (TID) | INTRAVENOUS | Status: AC
  Administered 2021-04-16 – 2021-04-17 (×2): 2 g via INTRAVENOUS
  Filled 2021-04-16 (×2): qty 100

## 2021-04-16 MED ORDER — OXYCODONE HCL 5 MG PO TABS
5.0000 mg | ORAL_TABLET | Freq: Once | ORAL | Status: DC | PRN
Start: 2021-04-16 — End: 2021-04-16

## 2021-04-16 MED ORDER — HEMOSTATIC AGENTS (NO CHARGE) OPTIME
TOPICAL | Status: DC | PRN
  Administered 2021-04-16: 1 via TOPICAL

## 2021-04-16 MED ORDER — DEXMEDETOMIDINE (PRECEDEX) IN NS 20 MCG/5ML (4 MCG/ML) IV SYRINGE
PREFILLED_SYRINGE | INTRAVENOUS | Status: DC | PRN
  Administered 2021-04-16: 8 ug via INTRAVENOUS

## 2021-04-16 MED ORDER — TRIAMCINOLONE ACETONIDE 40 MG/ML IJ SUSP
INTRAMUSCULAR | Status: DC | PRN
  Administered 2021-04-16: 40 mg via INTRAMUSCULAR

## 2021-04-16 MED ORDER — LIDOCAINE 2% (20 MG/ML) 5 ML SYRINGE
INTRAMUSCULAR | Status: AC
  Filled 2021-04-16: qty 5

## 2021-04-16 SURGICAL SUPPLY — 62 items
APL SKNCLS STERI-STRIP NONHPOA (GAUZE/BANDAGES/DRESSINGS) ×1
BAND INSRT 18 STRL LF DISP RB (MISCELLANEOUS) ×2
BAND RUBBER #18 3X1/16 STRL (MISCELLANEOUS) ×4 IMPLANT
BENZOIN TINCTURE PRP APPL 2/3 (GAUZE/BANDAGES/DRESSINGS) ×1 IMPLANT
BIT DRILL NEURO 2X3.1 SFT TUCH (MISCELLANEOUS) ×1 IMPLANT
BLADE CLIPPER SURG (BLADE) IMPLANT
BUR CARBIDE MATCH 3.0 (BURR) ×2 IMPLANT
CANISTER SUCT 3000ML PPV (MISCELLANEOUS) ×2 IMPLANT
CARTRIDGE OIL MAESTRO DRILL (MISCELLANEOUS) ×1 IMPLANT
COVER MAYO STAND STRL (DRAPES) ×4 IMPLANT
COVER WAND RF STERILE (DRAPES) ×2 IMPLANT
DIFFUSER DRILL AIR PNEUMATIC (MISCELLANEOUS) ×2 IMPLANT
DRAPE C-ARM 42X72 X-RAY (DRAPES) ×2 IMPLANT
DRAPE HALF SHEET 40X57 (DRAPES) IMPLANT
DRAPE LAPAROTOMY 100X72X124 (DRAPES) ×2 IMPLANT
DRAPE MICROSCOPE LEICA (MISCELLANEOUS) ×2 IMPLANT
DRILL NEURO 2X3.1 SOFT TOUCH (MISCELLANEOUS) ×2
DRSG OPSITE POSTOP 4X6 (GAUZE/BANDAGES/DRESSINGS) ×2 IMPLANT
DURAPREP 6ML APPLICATOR 50/CS (WOUND CARE) ×2 IMPLANT
ELECT COATED BLADE 2.86 ST (ELECTRODE) ×2 IMPLANT
ELECT REM PT RETURN 9FT ADLT (ELECTROSURGICAL) ×2
ELECTRODE REM PT RTRN 9FT ADLT (ELECTROSURGICAL) ×1 IMPLANT
EVACUATOR 1/8 PVC DRAIN (DRAIN) IMPLANT
GAUZE 4X4 16PLY RFD (DISPOSABLE) IMPLANT
GLOVE ECLIPSE 8.0 STRL XLNG CF (GLOVE) ×4 IMPLANT
GLOVE EXAM NITRILE LRG STRL (GLOVE) IMPLANT
GLOVE EXAM NITRILE XL STR (GLOVE) IMPLANT
GLOVE EXAM NITRILE XS STR PU (GLOVE) IMPLANT
GLOVE SRG 8 PF TXTR STRL LF DI (GLOVE) ×1 IMPLANT
GLOVE SURG UNDER POLY LF SZ8 (GLOVE) ×2
GOWN STRL REUS W/ TWL LRG LVL3 (GOWN DISPOSABLE) IMPLANT
GOWN STRL REUS W/ TWL XL LVL3 (GOWN DISPOSABLE) ×1 IMPLANT
GOWN STRL REUS W/TWL 2XL LVL3 (GOWN DISPOSABLE) IMPLANT
GOWN STRL REUS W/TWL LRG LVL3 (GOWN DISPOSABLE) ×4
GOWN STRL REUS W/TWL XL LVL3 (GOWN DISPOSABLE) ×2
HEMOSTAT POWDER KIT SURGIFOAM (HEMOSTASIS) ×2 IMPLANT
KIT BASIN OR (CUSTOM PROCEDURE TRAY) ×2 IMPLANT
KIT TURNOVER KIT B (KITS) ×2 IMPLANT
MODULE EMG NDL SSEP NVM5 (NEEDLE) IMPLANT
MODULE EMG NEEDLE SSEP NVM5 (NEEDLE) ×2 IMPLANT
NDL SPNL 18GX3.5 QUINCKE PK (NEEDLE) ×1 IMPLANT
NEEDLE HYPO 22GX1.5 SAFETY (NEEDLE) ×2 IMPLANT
NEEDLE SPNL 18GX3.5 QUINCKE PK (NEEDLE) ×2 IMPLANT
NS IRRIG 1000ML POUR BTL (IV SOLUTION) ×2 IMPLANT
OIL CARTRIDGE MAESTRO DRILL (MISCELLANEOUS) ×2
PACK LAMINECTOMY NEURO (CUSTOM PROCEDURE TRAY) ×2 IMPLANT
PAD ARMBOARD 7.5X6 YLW CONV (MISCELLANEOUS) ×3 IMPLANT
PIN DISTRACTION 14MM (PIN) ×4 IMPLANT
PLATE CERV CONS OZARK 1X20 (Plate) ×2 IMPLANT
PUTTY BONE 100 VESUVIUS 1CC (Putty) ×1 IMPLANT
SCREW FIXED ST OZARK 4X16 (Screw) ×2 IMPLANT
SCREW VA ST OZARK 4X16 (Plate) ×4 IMPLANT
SPACER ANGLD CASCAD 16X13X8 7D (Spacer) ×1 IMPLANT
SPONGE INTESTINAL PEANUT (DISPOSABLE) ×2 IMPLANT
SPONGE SURGIFOAM ABS GEL SZ50 (HEMOSTASIS) ×2 IMPLANT
STAPLER VISISTAT 35W (STAPLE) ×2 IMPLANT
STRIP CLOSURE SKIN 1/2X4 (GAUZE/BANDAGES/DRESSINGS) ×2 IMPLANT
TAPE SURG TRANSPORE 1 IN (GAUZE/BANDAGES/DRESSINGS) ×1 IMPLANT
TAPE SURGICAL TRANSPORE 1 IN (GAUZE/BANDAGES/DRESSINGS) ×2
TOWEL GREEN STERILE (TOWEL DISPOSABLE) ×2 IMPLANT
TOWEL GREEN STERILE FF (TOWEL DISPOSABLE) ×2 IMPLANT
WATER STERILE IRR 1000ML POUR (IV SOLUTION) ×2 IMPLANT

## 2021-04-16 NOTE — Anesthesia Preprocedure Evaluation (Addendum)
Anesthesia Evaluation  Patient identified by MRN, date of birth, ID band Patient awake    Reviewed: Allergy & Precautions, NPO status , Patient's Chart, lab work & pertinent test results  Airway Mallampati: III  TM Distance: >3 FB Neck ROM: Full    Dental no notable dental hx.    Pulmonary asthma , Current SmokerPatient did not abstain from smoking.,    Pulmonary exam normal breath sounds clear to auscultation       Cardiovascular negative cardio ROS Normal cardiovascular exam Rhythm:Regular Rate:Normal  ECG: NSR, rate 94   Neuro/Psych  Headaches, Seizures -, Well Controlled,  PSYCHIATRIC DISORDERS Anxiety Depression    GI/Hepatic Neg liver ROS, GERD  Controlled,  Endo/Other  negative endocrine ROS  Renal/GU negative Renal ROS     Musculoskeletal  (+) Arthritis ,   Abdominal   Peds  Hematology negative hematology ROS (+)   Anesthesia Other Findings Cervical stenosis of spinal canal  Reproductive/Obstetrics                           Anesthesia Physical Anesthesia Plan  ASA: III  Anesthesia Plan: General   Post-op Pain Management:    Induction: Intravenous  PONV Risk Score and Plan: 1 and Ondansetron, Dexamethasone, Midazolam and Treatment may vary due to age or medical condition  Airway Management Planned: Oral ETT and Video Laryngoscope Planned  Additional Equipment:   Intra-op Plan:   Post-operative Plan: Extubation in OR  Informed Consent: I have reviewed the patients History and Physical, chart, labs and discussed the procedure including the risks, benefits and alternatives for the proposed anesthesia with the patient or authorized representative who has indicated his/her understanding and acceptance.     Dental advisory given  Plan Discussed with: CRNA  Anesthesia Plan Comments:        Anesthesia Quick Evaluation

## 2021-04-16 NOTE — H&P (Signed)
Providing Compassionate, Quality Care - Together  NEUROSURGERY HISTORY & PHYSICAL   Alan Sanders is an 50 y.o. male.   Chief Complaint: Gait imbalance HPI: This is a 50 year old male with a history of imbalance and numbness and tingling in his feet.  Over the past 3-6 months he has had continued progressive off balance and falls.  He denies much neck pain however work-up revealed cervical stenosis, and repeat MRI revealed progressive stenosis at C3-4 with cord signal change and myelomalacia.  I recommended ACDF C3-4, he presents today for such.  We discussed all risks, benefits and expected outcomes and he agrees to proceed.  Past Medical History:  Diagnosis Date  . Anxiety   . Arthritis    " in my back "  . Asthma   . Balance problem   . Gait difficulty   . GERD (gastroesophageal reflux disease)   . Headache    migraines  . Inguinal hernia of left side without obstruction or gangrene   . Pneumonia   . Seizures ([place])    " its been along time ,since my last seizure "  . Spleen enlarged   . Thrombocytopenia ([place])   . Tobacco abuse     Past Surgical History:  Procedure Laterality Date  . HERNIA REPAIR     inguinal  . SURAL NERVE BX Right 12/11/2020   Procedure: SURAL NERVE BIOPSY;  Surgeon: Karsten Ro, DO;  Location: [facility];  Service: Neurosurgery;  Laterality: Right;    Family History  Problem Relation Age of Onset  . Diabetes Mother   . Hypertension Mother   . COPD Mother   . Alcoholism Brother    Social History:  reports that he has been smoking cigarettes. He has a 12.00 pack-year smoking history. His smokeless tobacco use includes snuff. He reports that he does not drink alcohol and does not use drugs.  Allergies: No Known Allergies  Medications Prior to Admission  Medication Sig Dispense Refill  . fluticasone (FLONASE) 50 MCG/ACT nasal spray PLACE 2 SPRAYS INTO BOTH NOSTRILS DAILY. (Patient taking differently: Place 2 sprays into both nostrils daily as  needed for allergies.) 16 g 6  . gabapentin (NEURONTIN) 300 MG capsule TAKE 1 CAPSULE BY MOUTH 3 TIMES DAILY (Patient taking differently: Take 300 mg by mouth 3 (three) times daily.) 90 capsule 2  . PARoxetine (PAXIL) 20 MG tablet TAKE 1 TABLET (20 MG TOTAL) BY MOUTH DAILY. 90 tablet 1  . diazepam (VALIUM) 5 MG tablet take 1 tablet by oral route 30 minutes before MRI (Patient not taking: No sig reported) 1 tablet 0  . gabapentin (NEURONTIN) 300 MG capsule TAKE 1 CAPSULE BY MOUTH 3 TIMES DAILY (Patient not taking: No sig reported) 90 capsule 1  . HYDROcodone-acetaminophen (NORCO/VICODIN) 5-325 MG tablet TAKE 1 TABLET BY MOUTH EVERY SIX HOURS AS NEEDED FOR MODERATE PAIN. (Patient not taking: No sig reported) 20 tablet 0  . HYDROcodone-acetaminophen (NORCO/VICODIN) 5-325 MG tablet TAKE 1 TABLET BY MOUTH EVERY 4 HOURS AS NEEDED FOR PAIN (Patient not taking: No sig reported) 30 tablet 0  . HYDROcodone-acetaminophen (NORCO/VICODIN) 5-325 MG tablet TAKE 1 TABLET BY MOUTH EVERY 4 HOURS AS NEEDED FOR PAIN (Patient not taking: No sig reported) 30 tablet 0  . HYDROcodone-acetaminophen (NORCO/VICODIN) 5-325 MG tablet TAKE 1 TABLET BY MOUTH EVERY 4 HOURS AS NEEDED FOR PAIN (Patient not taking: No sig reported) 30 tablet 0  . ibuprofen (ADVIL,MOTRIN) 200 MG tablet Take 400-600 mg by mouth every 6 (  six) hours as needed for headache or moderate pain.      Results for orders placed or performed during the hospital encounter of 04/16/21 (from the past 48 hour(s))  Basic metabolic panel     Status: Abnormal   Collection Time: 04/16/21  6:03 AM  Result Value Ref Range   Sodium 134 (L) 135 - 145 mmol/L   Potassium 4.0 3.5 - 5.1 mmol/L   Chloride 101 98 - 111 mmol/L   CO2 24 22 - 32 mmol/L   Glucose, Bld 129 (H) 70 - 99 mg/dL    Comment: Glucose reference range applies only to samples taken after fasting for at least 8 hours.   BUN 15 6 - 20 mg/dL   Creatinine, Ser 0.70 0.61 - 1.24 mg/dL   Calcium 9.0 8.9 - 10.3  mg/dL   GFR, Estimated >60 >60 mL/min    Comment: (NOTE) Calculated using the CKD-EPI Creatinine Equation (2021)    Anion gap 9 5 - 15    Comment: Performed at [facility] [address], [place], [place] [address]  CBC WITH DIFFERENTIAL     Status: Abnormal   Collection Time: 04/16/21  6:03 AM  Result Value Ref Range   WBC 7.7 4.0 - 10.5 K/uL   RBC 5.74 4.22 - 5.81 MIL/uL   Hemoglobin 16.3 13.0 - 17.0 g/dL   HCT 47.8 39.0 - 52.0 %   MCV 83.3 80.0 - 100.0 fL   MCH 28.4 26.0 - 34.0 pg   MCHC 34.1 30.0 - 36.0 g/dL   RDW 13.6 11.5 - 15.5 %   Platelets 128 (L) 150 - 400 K/uL    Comment: REPEATED TO VERIFY   nRBC 0.0 0.0 - 0.2 %   Neutrophils Relative % 66 %   Neutro Abs 5.1 1.7 - 7.7 K/uL   Lymphocytes Relative 23 %   Lymphs Abs 1.8 0.7 - 4.0 K/uL   Monocytes Relative 7 %   Monocytes Absolute 0.5 0.1 - 1.0 K/uL   Eosinophils Relative 3 %   Eosinophils Absolute 0.2 0.0 - 0.5 K/uL   Basophils Relative 1 %   Basophils Absolute 0.0 0.0 - 0.1 K/uL   Immature Granulocytes 0 %   Abs Immature Granulocytes 0.02 0.00 - 0.07 K/uL    Comment: Performed at [facility], [address], [place], [place] [address]  Protime-INR     Status: None   Collection Time: 04/16/21  6:03 AM  Result Value Ref Range   Prothrombin Time 13.0 11.4 - 15.2 seconds   INR 1.0 0.8 - 1.2    Comment: (NOTE) INR goal varies based on device and disease states. Performed at [facility], [place] [address], [place], [place] [address]   Type and screen     Status: None   Collection Time: 04/16/21  6:31 AM  Result Value Ref Range   ABO/RH(D) A POS    Antibody Screen NEG    Sample Expiration      04/19/2021,2359 Performed at [facility] [address], [place], [place] [address]   ABO/Rh     Status: None   Collection Time: 04/16/21  6:38 AM  Result Value Ref Range   ABO/RH(D)      A POS Performed at [facility] [address], [place], [place] [address]    No results  found.  ROS  14 point review was obtained, all pertinent positives and negatives are listed HPI above  Blood pressure (!) 143/86, pulse 67, temperature 98.4 F (36.9 C), temperature source Oral, resp. rate 17, height 6\' 2"  (1.88 m), weight 102.1 kg, SpO2 96 %. Physical Exam  A&O x3 No acute distress PERRLA EOMI Bilateral upper extremity 4+/5 Bilateral lower extremity 4+/5 Negative Hoffmann's bilaterally Sensory intact light touch throughout except stocking pattern in bilateral lower extremities   Assessment/Plan 50 year old male with  1.  C3-4 cervical stenosis with myelopathy -OR today for ACDF C3-4.  All risks, benefits and expected outcomes discussed and agreed upon.   Thank you for allowing me to participate in this patient's care.  Please do not hesitate to call with questions or concerns.   Elwin Sleight, [facility] Cell: [phone]

## 2021-04-16 NOTE — Progress Notes (Signed)
   Providing Compassionate, Quality Care - Together  NEUROSURGERY PROGRESS NOTE   S: pt s/e in recovery, complains of HA  O: EXAM:  BP 114/77   Pulse 80   Temp 97.7 F (36.5 C)   Resp 14   Ht 6\' 2"  (1.88 m)   Wt 102.1 kg   SpO2 96%   BMI 28.89 kg/m   Awake, alert Communicating Neck soft Trachea midline nml phonation  CNs grossly intact  Symmetric strength, full in BUE/BLE  Dressing c/d/i  ASSESSMENT:  50 y.o. male with   1. C3-4 Stenosis with myelopathy and cord signal change  -s/p ACDF C3-4 on 04/16/2021  PLAN: - pt/ot -collar -pain control -updated wife    Thank you for allowing me to participate in this patient's care.  Please do not hesitate to call with questions or concerns.   Elwin Sleight, [facility] Cell: [phone]

## 2021-04-16 NOTE — Progress Notes (Signed)
Orthopedic Tech Progress Note Patient Details:  Alan Sanders 07-31-1971 [id number] PACU RN stated "he had called [facility] for an [place]" Patient ID: Alan Sanders, male   DOB: [birth year], 51 y.o.   MRN: [id number]   Janit Pagan 04/16/2021, 12:10 PM

## 2021-04-16 NOTE — Anesthesia Postprocedure Evaluation (Signed)
Anesthesia Post Note  Patient: PEACE JOST  Procedure(s) Performed: Cervical three-four  Anterior cervical decompression/discectomy/fusion (N/A )     Patient location during evaluation: PACU Anesthesia Type: General Level of consciousness: awake Pain management: pain level controlled Vital Signs Assessment: post-procedure vital signs reviewed and stable Respiratory status: spontaneous breathing, nonlabored ventilation, respiratory function stable and patient connected to nasal cannula oxygen Cardiovascular status: blood pressure returned to baseline and stable Postop Assessment: no apparent nausea or vomiting Anesthetic complications: no   No complications documented.  Last Vitals:  Vitals:   04/16/21 1208 04/16/21 1531  BP:  123/76  Pulse: 73 77  Resp:  18  Temp:  (!) 36.4 C  SpO2: 97% 94%    Last Pain:  Vitals:   04/16/21 1531  TempSrc: Oral  PainSc:                  Karyl Kinnier Braison Snoke

## 2021-04-16 NOTE — Op Note (Signed)
Date of service: 04/16/2021  PREOP DIAGNOSIS:  1. Cervical spondylosis with myelopathy, C3-4 with cord signal change  POSTOP DIAGNOSIS: Same  PROCEDURE: 1. Arthrodesis C3-4, anterior interbody technique  2. Placement of intervertebral biomechanical device C3-4, K2M [place] titanium interbody device (59mm x 54mm x 79mm, 7 deg lordotic) 3. Placement of anterior instrumentation consisting of interbody plate and screws -I2M [place] plate with 16 mm screws 4. Discectomy at C3-4 for decompression of spinal cord and exiting nerve roots  5. Use of morselized bone allograft  6. Use of intraoperative microscope 7.  Intraoperative use of neuro monitoring, SSEPs, greater than 1 hour  SURGEON: Dr. Elwin Sleight, DO  ASSISTANT: Dr. Granville Lewis, MD  ANESTHESIA: General Endotracheal  EBL: 30 cc  SPECIMENS: None  DRAINS: None  COMPLICATIONS: None immediate  CONDITION: Hemodynamically stable to PACU  HISTORY: Alan Sanders is a 50 y.o. y.o. male who initially presented to the outpatient clinic with complaints of progressive imbalance.  He also had a work-up that revealed polyneuropathy.  MRI revealed he had cervical stenosis C3-4 with cord signal change.  Over the past 3-6 months he had progressive gait imbalance and more episodes of falls.  Due to his progressive myelopathy, I discussed with him risks, benefits of surgical intervention versus continued monitoring.  I recommended an anterior cervical discectomy and fusion at C3-4.  We discussed all risks and benefits expected outcomes and he agreed to proceed.  PROCEDURE IN DETAIL: The patient was brought to the operating room and transferred to the operative table. After induction of general anesthesia, baseline SSEPs were obtained and noted to be monitorable.  The patient was positioned on the operative table in the supine position with all pressure points meticulously padded. The skin of the neck was then prepped and draped in the usual sterile  fashion.  Post positioning SSEPs were noted to be stable.  After timeout was conducted, skin incision was then made sharply blade and Bovie electrocautery was used to dissect the subcutaneous tissue until the platysma was identified. The platysma was then divided and undermined. The sternocleidomastoid muscle was then identified and, utilizing natural fascial planes in the neck, the prevertebral fascia was identified and the carotid sheath was retracted laterally and the trachea and esophagus retracted medially. Again using fluoroscopy, the correct disc space was identified, C3-4. Bovie electrocautery was used to dissect in the subperiosteal plane and elevate the bilateral longus coli muscles at C3 and C4 bilaterally. Self-retaining retractors were then placed under the longus coli muscles. At this point, the microscope was draped and brought into the field, and the remainder of the case was done under the microscope using microdissecting technique.  The disc space was incised sharply and rongeurs were use to initially complete a discectomy.  Distraction pins were placed in midline at C3 and C4 vertebral bodies.  The disc base was placed under distraction.  The high-speed drill was then used to complete discectomy until the posterior annulus was identified and removed and the posterior longitudinal ligament was identified. Using a micro curettes, the PLL was elevated, and Kerrison rongeurs were used to remove the posterior longitudinal ligament and the ventral thecal sac was identified. Using a combination of curettes and ronguers, complete decompression of the thecal sac and exiting nerve roots at this level was completed, and verified using micro-nerve hook.  There was broad-based disc herniation and posterior osteophytosis that was resected with Kerrison rongeurs.  The epidural space appeared decompressed.  The dura was noted to be  pulsatile.  The disc base was taken out of distraction.  Epidural hemostasis  was achieved with Surgiflo. The disc space was taken out of distraction.  Having completed our decompression, attention was turned to placement of the intervertebral device. Trial spacers were used to select a 8 mm graft.  The epidural space was noted to be hemostatic. This graft was then filled with morcellized allograft, and inserted under live fluoroscopy.  After placement of the intervertebral device, the distraction pins were removed, hemostasis was achieved with surgiflow and the above anterior cervical plate was selected, and placed across the interspace. Using a high-speed drill, the cortex of the cervical vertebral bodies was punctured, and screws inserted in C3 and C4 bilaterally.  Final fluoroscopic images in AP and lateral projections were taken to confirm good hardware placement.  At this point, after all counts were verified to be correct x2, meticulous hemostasis was secured using a combination of bipolar electrocautery and passive hemostatics.  The wound was copiously irrigated and explored and noted to be excellently hemostatic.  Kenalog was gently rubbed along the lateral aspect of the esophagus.  The skin was closed with staples.  Sterile dressing was applied.  The drapes were taken down.  Throughout the entirety of the case, SSEPs remained stable.  The patient tolerated the procedure well and was extubated in the room and taken to the postanesthesia care unit in stable condition.

## 2021-04-16 NOTE — Anesthesia Procedure Notes (Signed)
Procedure Name: Intubation Date/Time: 04/16/2021 7:59 AM Performed by: Amadeo Garnet, CRNA Pre-anesthesia Checklist: Patient identified, Emergency Drugs available, Suction available and Patient being monitored Patient Re-evaluated:Patient Re-evaluated prior to induction Oxygen Delivery Method: Circle system utilized Preoxygenation: Pre-oxygenation with 100% oxygen Induction Type: IV induction Ventilation: Mask ventilation without difficulty and Oral airway inserted - appropriate to patient size Laryngoscope Size: 4 and Glidescope Grade View: Grade I Tube type: Oral Tube size: 7.5 mm Number of attempts: 1 Airway Equipment and Method: Stylet and Oral airway Placement Confirmation: ETT inserted through vocal cords under direct vision,  positive ETCO2 and breath sounds checked- equal and bilateral Secured at: 23 cm Tube secured with: Tape Dental Injury: Teeth and Oropharynx as per pre-operative assessment

## 2021-04-16 NOTE — Transfer of Care (Signed)
Immediate Anesthesia Transfer of Care Note  Patient: KORTNEY SCHOENFELDER  Procedure(s) Performed: Cervical three-four  Anterior cervical decompression/discectomy/fusion (N/A )  Patient Location: PACU  Anesthesia Type:General  Level of Consciousness: awake, alert  and oriented  Airway & Oxygen Therapy: Patient Spontanous Breathing and Patient connected to face mask oxygen  Post-op Assessment: Report given to RN, Post -op Vital signs reviewed and stable and Patient moving all extremities  Post vital signs: Reviewed and stable  Last Vitals:  Vitals Value Taken Time  BP 115/61 04/16/21 1029  Temp    Pulse 85 04/16/21 1035  Resp 26 04/16/21 1031  SpO2 88 % 04/16/21 1035  Vitals shown include unvalidated device data.  Last Pain:  Vitals:   04/16/21 0648  TempSrc:   PainSc: 6       Patients Stated Pain Goal: 3 (01/58/68 2574)  Complications: No complications documented.

## 2021-04-17 ENCOUNTER — Other Ambulatory Visit (HOSPITAL_COMMUNITY): Payer: Self-pay

## 2021-04-17 ENCOUNTER — Encounter (HOSPITAL_COMMUNITY): Payer: Self-pay | Admitting: Neurological Surgery

## 2021-04-17 DIAGNOSIS — M4802 Spinal stenosis, cervical region: Secondary | ICD-10-CM | POA: Diagnosis not present

## 2021-04-17 DIAGNOSIS — F17210 Nicotine dependence, cigarettes, uncomplicated: Secondary | ICD-10-CM | POA: Diagnosis not present

## 2021-04-17 DIAGNOSIS — M4712 Other spondylosis with myelopathy, cervical region: Secondary | ICD-10-CM | POA: Diagnosis not present

## 2021-04-17 DIAGNOSIS — J45909 Unspecified asthma, uncomplicated: Secondary | ICD-10-CM | POA: Diagnosis not present

## 2021-04-17 MED ORDER — GABAPENTIN 300 MG PO CAPS
300.0000 mg | ORAL_CAPSULE | Freq: Three times a day (TID) | ORAL | 2 refills | Status: DC
  Filled 2021-04-17: qty 90, 30d supply, fill #0

## 2021-04-17 MED ORDER — HYDROCODONE-ACETAMINOPHEN 10-325 MG PO TABS
1.0000 | ORAL_TABLET | ORAL | 0 refills | Status: DC | PRN
  Filled 2021-04-17: qty 30, 5d supply, fill #0

## 2021-04-17 MED ORDER — METHOCARBAMOL 500 MG PO TABS
500.0000 mg | ORAL_TABLET | Freq: Three times a day (TID) | ORAL | 1 refills | Status: DC | PRN
  Filled 2021-04-17: qty 180, 60d supply, fill #0

## 2021-04-17 MED ORDER — OXYCODONE HCL 10 MG PO TABS
10.0000 mg | ORAL_TABLET | ORAL | 0 refills | Status: DC | PRN
  Filled 2021-04-17: qty 30, 4d supply, fill #0

## 2021-04-17 NOTE — Plan of Care (Signed)
Adequately Ready for Discharge 

## 2021-04-17 NOTE — TOC Progression Note (Signed)
Transition of Care [facility]) - Progression Note    Patient Details  Name: Alan Sanders MRN: [id number] Date of Birth: 11-11-1971  Transition of Care [facility]) CM/SW [place], RN Phone Number: [phone]  04/17/2021, 11:20 AM  Clinical Narrative:    CM at bedside to offer choice. Home health has been set up with [facility]. Info has been addes to avs. CM attempted to call patient to update him but no answer. No further needs noted at this time, TOC will sign off.         Expected Discharge Plan and Services           Expected Discharge Date: 04/17/21                                     Social Determinants of Health (SDOH) Interventions    Readmission Risk Interventions No flowsheet data found.

## 2021-04-17 NOTE — Evaluation (Signed)
Occupational Therapy Evaluation Patient Details Name: Alan Sanders MRN: [id number] DOB: 1971/05/05 Today's Date: 04/17/2021    History of Present Illness 50 year old male with a history of imbalance and numbness and tingling in his feet.  Underwent a Cervical three-four  Anterior cervical decompression/discectomy/fusion 5/24.   Clinical Impression   Patient admitted for the procedure above.  PTA he and his spouse were staying at his parents home caring for them.  His father is recovering from a recent femur fracture, and his mother is currently at a SNF undergoing rehab.  Of note, he states his wife has early symptoms of dementia, but is able to care for his needs, and assist at home.  He was some discomfort, and continues with premorbid balance deficits.  Currently, he is at a Mod I level for ADL completion, and is walking with supervision and a RW.  PT eval is pending, and he plans on returning home today.      Follow Up Recommendations  No OT follow up    Equipment Recommendations  None recommended by OT    Recommendations for Other Services       Precautions / Restrictions Precautions Precautions: Cervical Precaution Booklet Issued: Yes (comment) Required Braces or Orthoses: Cervical Brace Cervical Brace: Hard collar;For comfort Restrictions Weight Bearing Restrictions: No Other Position/Activity Restrictions: prior balance deficits.  Numbness to feet.      Mobility Bed Mobility Overal bed mobility: Modified Independent                  Transfers Overall transfer level: Modified independent Equipment used: Rolling walker (2 wheeled)             General transfer comment: baseline balance deficits    Balance Overall balance assessment: Needs assistance Sitting-balance support: Feet supported Sitting balance-Leahy Scale: Good     Standing balance support: Bilateral upper extremity supported Standing balance-Leahy Scale: Fair Standing balance comment:  able to static stand without RW                           ADL either performed or assessed with clinical judgement   ADL Overall ADL's : Modified independent                                       General ADL Comments: Able to complete dressing from sit/stand level.  Cueing for precautions and placing hard collar     Vision Patient Visual Report: No change from baseline                  Pertinent Vitals/Pain Pain Assessment: 0-10 Pain Score: 4  Pain Location: shoulders Pain Descriptors / Indicators: Aching;Tender;Grimacing Pain Intervention(s): Monitored during session     Hand Dominance Right   Extremity/Trunk Assessment Upper Extremity Assessment Upper Extremity Assessment: Overall WFL for tasks assessed   Lower Extremity Assessment Lower Extremity Assessment: Defer to PT evaluation   Cervical / Trunk Assessment Cervical / Trunk Assessment: Other exceptions Cervical / Trunk Exceptions: cervical surgery   Communication Communication Communication: No difficulties   Cognition Arousal/Alertness: Awake/alert Behavior During Therapy: WFL for tasks assessed/performed Overall Cognitive Status: Within Functional Limits for tasks assessed  Home Living Family/patient expects to be discharged to:: Private residence Living Arrangements: Spouse/significant other;Parent (He will be staying at his parents home.) Available Help at Discharge: Family;Available 24 hours/day Type of Home: House       Home Layout: Two level;Able to live on main level with bedroom/bathroom     Bathroom Shower/Tub: Occupational psychologist: Handicapped height     Home Equipment: Shower seat;Grab bars - tub/shower          Prior Functioning/Environment Level of Independence: Independent                 OT Problem List: Pain;Impaired balance (sitting and/or standing)      OT  Treatment/Interventions:      OT Goals(Current goals can be found in the care plan section) Acute Rehab OT Goals Patient Stated Goal: Return home and recover OT Goal Formulation: With patient Time For Goal Achievement: 04/17/21 Potential to Achieve Goals: Good  OT Frequency:     Barriers to D/C:  none noted          Co-evaluation              AM-PAC OT "6 Clicks" Daily Activity     Outcome Measure Help from another person eating meals?: None Help from another person taking care of personal grooming?: None Help from another person toileting, which includes using toliet, bedpan, or urinal?: None Help from another person bathing (including washing, rinsing, drying)?: None Help from another person to put on and taking off regular upper body clothing?: None Help from another person to put on and taking off regular lower body clothing?: None 6 Click Score: 24   End of Session Equipment Utilized During Treatment: Rolling walker Nurse Communication: Patient requests pain meds  Activity Tolerance: Patient tolerated treatment well Patient left: in bed  OT Visit Diagnosis: Unsteadiness on feet (R26.81);Pain Pain - Right/Left:  (cervical)                Time: 3532-9924 OT Time Calculation (min): 32 min Charges:  OT General Charges $OT Visit: 1 Visit OT Evaluation $OT Eval Moderate Complexity: 1 Mod OT Treatments $Self Care/Home Management : 8-22 mins  04/17/2021  Rich, OTR/L  Acute Rehabilitation Services  Office:  [place] 04/17/2021, 8:50 AM

## 2021-04-17 NOTE — Evaluation (Signed)
Physical Therapy Evaluation Patient Details Name: Alan Sanders MRN: [id number] DOB: 1971-07-14 Today's Date: 04/17/2021   History of Present Illness  Pt is a 50 year old male who presents s/p C3-C4 ACDF on 04/16/2021. PMH significant for seizures, inguinal hernia s/p repair,  Clinical Impression  Pt admitted with above diagnosis. At the time of PT eval, pt was able to demonstrate transfers and ambulation with up to min assist and RW for support. Pt was educated on precautions, brace application/wearing schedule, appropriate activity progression, and car transfer. Pt currently with functional limitations due to the deficits listed below (see PT Problem List). Pt will benefit from skilled PT to increase their independence and safety with mobility to allow discharge to the venue listed below.      Follow Up Recommendations Home health PT;Supervision for mobility/OOB    Equipment Recommendations  None recommended by PT    Recommendations for Other Services       Precautions / Restrictions Precautions Precautions: Cervical Precaution Booklet Issued: Yes (comment) Required Braces or Orthoses: Cervical Brace Cervical Brace: Hard collar;For comfort Restrictions Weight Bearing Restrictions: No Other Position/Activity Restrictions: prior balance deficits.  Numbness to feet.      Mobility  Bed Mobility Overal bed mobility: Needs Assistance Bed Mobility: Rolling;Sidelying to Sit Rolling: Modified independent (Device/Increase time) Sidelying to sit: Supervision       General bed mobility comments: Approaching mod I. Slow and requiring cues for optimal log roll technique. HOB slightly elevated and rails lowered to simulate home environment.    Transfers Overall transfer level: Needs assistance Equipment used: Rolling walker (2 wheeled) Transfers: Sit to/from Stand Sit to Stand: Min assist         General transfer comment: From low bed height, pt required x4 attempted to  power-up to full stand. first x3 attempts were with min guard assist and the last attempt was with min assist. Increased time and min assist to gain/maintain standing balance.  Ambulation/Gait Ambulation/Gait assistance: Min guard;Supervision Gait Distance (Feet): 300 Feet Assistive device: Rolling walker (2 wheeled) Gait Pattern/deviations: Step-through pattern;Decreased stride length;Trunk flexed;Ataxic Gait velocity: Decreased Gait velocity interpretation: <1.31 ft/sec, indicative of household ambulator General Gait Details: VC's for improved posture, closer walker proximity, and forward gaze. No assist required however pt very guarded and appeared mildly ataxic at times.  Stairs Stairs: Yes Stairs assistance: Min guard Stair Management: One rail Left;Sideways;Step to pattern Number of Stairs: 6 (3x2) General stair comments: VC's for sequencing and general safety. Pt held to L railing and negotiated stairs sideways. Therapist placed foot at edge of the stair he was descending to as a guide because pt fearful of stepping off the edge of the step while he was descending.  Wheelchair Mobility    Modified Rankin (Stroke Patients Only)       Balance Overall balance assessment: Needs assistance Sitting-balance support: Feet supported Sitting balance-Leahy Scale: Good     Standing balance support: Bilateral upper extremity supported Standing balance-Leahy Scale: Fair Standing balance comment: able to static stand without RW                             Pertinent Vitals/Pain Pain Assessment: Faces Pain Score: 4  Faces Pain Scale: Hurts even more Pain Location: shoulders Pain Descriptors / Indicators: Aching;Grimacing;Operative site guarding Pain Intervention(s): Limited activity within patient's tolerance;Monitored during session;Repositioned    Home Living Family/patient expects to be discharged to:: Private residence Living Arrangements: Spouse/significant  other;Parent (He will  be staying at his parents home.) Available Help at Discharge: Family;Available 24 hours/day Type of Home: House Home Access: Stairs to enter Entrance Stairs-Rails: Left Entrance Stairs-Number of Steps: 3 Home Layout: Two level;Able to live on main level with bedroom/bathroom Home Equipment: Shower seat;Grab bars - tub/shower;Walker - 2 wheels      Prior Function Level of Independence: Independent               Hand Dominance   Dominant Hand: Right    Extremity/Trunk Assessment   Upper Extremity Assessment Upper Extremity Assessment: Defer to OT evaluation    Lower Extremity Assessment Lower Extremity Assessment: Generalized weakness    Cervical / Trunk Assessment Cervical / Trunk Assessment: Other exceptions Cervical / Trunk Exceptions: s/p cervical surgery  Communication   Communication: No difficulties  Cognition Arousal/Alertness: Awake/alert Behavior During Therapy: WFL for tasks assessed/performed Overall Cognitive Status: Within Functional Limits for tasks assessed                                        General Comments      Exercises     Assessment/Plan    PT Assessment Patient needs continued PT services  PT Problem List Decreased strength;Decreased activity tolerance;Decreased balance;Decreased mobility;Decreased knowledge of use of DME;Decreased safety awareness;Decreased knowledge of precautions;Pain       PT Treatment Interventions DME instruction;Gait training;Stair training;Functional mobility training;Therapeutic activities;Therapeutic exercise;Neuromuscular re-education;Patient/family education    PT Goals (Current goals can be found in the Care Plan section)  Acute Rehab PT Goals Patient Stated Goal: Return home and recover; 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 [employer] PT Goal Formulation: With patient/family Time For Goal Achievement: 04/24/21 Potential to Achieve Goals: Good     Frequency Min 5X/week   Barriers to discharge        Co-evaluation               AM-PAC PT "6 Clicks" Mobility  Outcome Measure Help needed turning from your back to your side while in a flat bed without using bedrails?: None Help needed moving from lying on your back to sitting on the side of a flat bed without using bedrails?: A Little Help needed moving to and from a bed to a chair (including a wheelchair)?: A Little Help needed standing up from a chair using your arms (e.g., wheelchair or bedside chair)?: A Little Help needed to walk in hospital room?: A Little Help needed climbing 3-5 steps with a railing? : A Little 6 Click Score: 19    End of Session Equipment Utilized During Treatment: Gait belt;Cervical collar Activity Tolerance: Patient tolerated treatment well Patient left: in chair;with call bell/phone within reach;with family/visitor present Nurse Communication: Mobility status PT Visit Diagnosis: Unsteadiness on feet (R26.81);Pain Pain - part of body:  (bilateral shoulders and neck)    Time: 9024-0973 PT Time Calculation (min) (ACUTE ONLY): 26 min   Charges:   PT Evaluation $PT Eval Low Complexity: 1 Low PT Treatments $Gait Training: 8-22 mins        Rolinda Roan, PT, DPT Acute Rehabilitation Services Pager: [phone] Office: [phone]   Thelma Comp 04/17/2021, 11:04 AM

## 2021-04-17 NOTE — Progress Notes (Signed)
Patient alert and oriented, voiding adequately, skin clean, dry and intact without evidence of skin break down, or symptoms of complications - no redness or edema noted, only slight tenderness at site.  Patient states pain is manageable at time of discharge. Patient has an appointment with MD in 2 weeks 

## 2021-04-17 NOTE — Discharge Summary (Signed)
Physician Discharge Summary  Patient ID: Alan Sanders MRN: [id number] DOB/AGE: January 14, 1971 50 y.o.  Admit date: 04/16/2021 Discharge date: 04/17/2021  Admission Diagnoses:  1.  Cervical myelopathy, C3-4 stenosis with cord compression  Discharge Diagnoses:  Same Active Problems:   Cervical myelopathy [facility])   Discharged Condition: Stable  Hospital Course:  Alan Sanders is a 50 y.o. male who presented with cervical myelopathy and cord compression at C3-4.  He underwent an elective C3-4 ACDF on 04/16/2021.  He tolerated the surgery well.  Postoperatively his ambulation was improved.  He was recommended to have home health care therefore this was ordered.  He was tolerating normal diet, his pain was controlled on oral medications.  He was having normal bowel bladder function upon discharge.  His incision was clean dry and intact.  Treatments: Surgery -C3-4 ACDF  Discharge Exam: Blood pressure (!) 119/55, pulse (!) 53, temperature (!) 97.5 F (36.4 C), temperature source Oral, resp. rate 17, height 6\' 2"  (1.88 m), weight 102.1 kg, SpO2 97 %. Awake, alert, oriented Speech fluent, appropriate CN grossly intact 4+/5 BUE/BLE Neck soft Trachea midline Normal phonation Collar in place Wound c/d/i  Disposition: Discharge disposition: 06-Home-Health Care Svc       Discharge Instructions    Face-to-face encounter (required for Medicare/Medicaid patients)   Complete by: As directed    I Theodoro Doing Naksh Radi certify that this patient is under my care and that I, or a nurse practitioner or physician's assistant working with me, had a face-to-face encounter that meets the physician face-to-face encounter requirements with this patient on 04/17/2021. The encounter with the patient was in whole, or in part for the following medical condition(s) which is the primary reason for home health care (List medical condition): cervical stenosis   The encounter with the patient was in whole, or in part,  for the following medical condition, which is the primary reason for home health care: cervical stenosis   I certify that, based on my findings, the following services are medically necessary home health services: Physical therapy   Reason for Medically Necessary Home Health Services: Therapy- Personnel officer, Public librarian   My clinical findings support the need for the above services: Pain interferes with ambulation/mobility   Further, I certify that my clinical findings support that this patient is homebound due to: Unsafe ambulation due to balance issues   Home Health   Complete by: As directed    To provide the following care/treatments:  PT OT     Incentive spirometry RT   Complete by: As directed      Allergies as of 04/17/2021   No Known Allergies     Medication List    STOP taking these medications   diazepam 5 MG tablet Commonly known as: VALIUM   HYDROcodone-acetaminophen 5-325 MG tablet Commonly known as: NORCO/VICODIN Replaced by: HYDROcodone-acetaminophen 10-325 MG tablet   ibuprofen 200 MG tablet Commonly known as: ADVIL     TAKE these medications   fluticasone 50 MCG/ACT nasal spray Commonly known as: FLONASE PLACE 2 SPRAYS INTO BOTH NOSTRILS DAILY. What changed:   when to take this  reasons to take this   gabapentin 300 MG capsule Commonly known as: NEURONTIN Take 1 capsule (300 mg total) by mouth 3 (three) times daily. What changed:   how much to take  Another medication with the same name was removed. Continue taking this medication, and follow the directions you see here.   HYDROcodone-acetaminophen 10-325 MG tablet  Commonly known as: NORCO Take 1 tablet by mouth every 4 (four) hours as needed for moderate pain ((score 4 to 6)). Replaces: HYDROcodone-acetaminophen 5-325 MG tablet   methocarbamol 500 MG tablet Commonly known as: ROBAXIN Take 1 tablet (500 mg total) by mouth every 8 (eight) hours as needed for muscle  spasms.   Oxycodone HCl 10 MG Tabs Take 1 tablet (10 mg total) by mouth every 3 (three) hours as needed for severe pain ((score 7 to 10)).   PARoxetine 20 MG tablet Commonly known as: PAXIL TAKE 1 TABLET (20 MG TOTAL) BY MOUTH DAILY.       Follow-up Information    Aquan Kope C, DO Follow up in 1 week(s).   Why: will setup appointment Contact information: [place] [address] [phone]               Signed: Theodoro Doing [place] 04/17/2021, 9:49 AM

## 2021-04-18 ENCOUNTER — Telehealth: Payer: Self-pay

## 2021-04-18 DIAGNOSIS — M1990 Unspecified osteoarthritis, unspecified site: Secondary | ICD-10-CM | POA: Diagnosis not present

## 2021-04-18 DIAGNOSIS — M5011 Cervical disc disorder with radiculopathy,  high cervical region: Secondary | ICD-10-CM | POA: Diagnosis not present

## 2021-04-18 DIAGNOSIS — R569 Unspecified convulsions: Secondary | ICD-10-CM | POA: Diagnosis not present

## 2021-04-18 DIAGNOSIS — G43909 Migraine, unspecified, not intractable, without status migrainosus: Secondary | ICD-10-CM | POA: Diagnosis not present

## 2021-04-18 DIAGNOSIS — J45909 Unspecified asthma, uncomplicated: Secondary | ICD-10-CM | POA: Diagnosis not present

## 2021-04-18 DIAGNOSIS — M5001 Cervical disc disorder with myelopathy,  high cervical region: Secondary | ICD-10-CM | POA: Diagnosis not present

## 2021-04-18 DIAGNOSIS — M4802 Spinal stenosis, cervical region: Secondary | ICD-10-CM | POA: Diagnosis not present

## 2021-04-18 DIAGNOSIS — F419 Anxiety disorder, unspecified: Secondary | ICD-10-CM | POA: Diagnosis not present

## 2021-04-18 DIAGNOSIS — Z4789 Encounter for other orthopedic aftercare: Secondary | ICD-10-CM | POA: Diagnosis not present

## 2021-04-18 NOTE — Telephone Encounter (Signed)
Home Health Verbal Orders  Agency:  [facility]  Caller: Reginia Forts.  Requesting PT  Frequency: 1 WEEK 1 // 2 WEEK 3

## 2021-04-18 NOTE — Telephone Encounter (Signed)
Returned call for verbal order, no answer LMTCB

## 2021-04-19 ENCOUNTER — Encounter (HOSPITAL_COMMUNITY): Payer: Self-pay | Admitting: Emergency Medicine

## 2021-04-19 ENCOUNTER — Emergency Department (HOSPITAL_COMMUNITY)
Admission: EM | Admit: 2021-04-19 | Discharge: 2021-04-19 | Disposition: A | Discharge status: Home / Self Care | Payer: 59 | Attending: Emergency Medicine | Admitting: Emergency Medicine

## 2021-04-19 ENCOUNTER — Telehealth: Payer: Self-pay | Admitting: Family Medicine

## 2021-04-19 ENCOUNTER — Other Ambulatory Visit: Payer: Self-pay

## 2021-04-19 ENCOUNTER — Encounter (HOSPITAL_BASED_OUTPATIENT_CLINIC_OR_DEPARTMENT_OTHER): Payer: Self-pay | Admitting: *Deleted

## 2021-04-19 ENCOUNTER — Emergency Department (HOSPITAL_BASED_OUTPATIENT_CLINIC_OR_DEPARTMENT_OTHER)
Admission: EM | Admit: 2021-04-19 | Discharge: 2021-04-20 | Disposition: A | Discharge status: Home / Self Care | Payer: 59 | Source: Home / Self Care | Attending: Emergency Medicine | Admitting: Emergency Medicine

## 2021-04-19 DIAGNOSIS — J690 Pneumonitis due to inhalation of food and vomit: Secondary | ICD-10-CM | POA: Insufficient documentation

## 2021-04-19 DIAGNOSIS — R079 Chest pain, unspecified: Secondary | ICD-10-CM | POA: Diagnosis not present

## 2021-04-19 DIAGNOSIS — R001 Bradycardia, unspecified: Secondary | ICD-10-CM | POA: Diagnosis not present

## 2021-04-19 DIAGNOSIS — M5011 Cervical disc disorder with radiculopathy,  high cervical region: Secondary | ICD-10-CM | POA: Diagnosis not present

## 2021-04-19 DIAGNOSIS — M4802 Spinal stenosis, cervical region: Secondary | ICD-10-CM | POA: Diagnosis not present

## 2021-04-19 DIAGNOSIS — M1990 Unspecified osteoarthritis, unspecified site: Secondary | ICD-10-CM | POA: Diagnosis not present

## 2021-04-19 DIAGNOSIS — Z981 Arthrodesis status: Secondary | ICD-10-CM | POA: Insufficient documentation

## 2021-04-19 DIAGNOSIS — Z4789 Encounter for other orthopedic aftercare: Secondary | ICD-10-CM | POA: Diagnosis not present

## 2021-04-19 DIAGNOSIS — R1310 Dysphagia, unspecified: Secondary | ICD-10-CM

## 2021-04-19 DIAGNOSIS — F17210 Nicotine dependence, cigarettes, uncomplicated: Secondary | ICD-10-CM | POA: Insufficient documentation

## 2021-04-19 DIAGNOSIS — J45909 Unspecified asthma, uncomplicated: Secondary | ICD-10-CM | POA: Diagnosis not present

## 2021-04-19 DIAGNOSIS — R569 Unspecified convulsions: Secondary | ICD-10-CM | POA: Diagnosis not present

## 2021-04-19 DIAGNOSIS — F419 Anxiety disorder, unspecified: Secondary | ICD-10-CM | POA: Diagnosis not present

## 2021-04-19 DIAGNOSIS — M5001 Cervical disc disorder with myelopathy,  high cervical region: Secondary | ICD-10-CM | POA: Diagnosis not present

## 2021-04-19 DIAGNOSIS — Z5321 Procedure and treatment not carried out due to patient leaving prior to being seen by health care provider: Secondary | ICD-10-CM | POA: Insufficient documentation

## 2021-04-19 DIAGNOSIS — G43909 Migraine, unspecified, not intractable, without status migrainosus: Secondary | ICD-10-CM | POA: Diagnosis not present

## 2021-04-19 DIAGNOSIS — R0602 Shortness of breath: Secondary | ICD-10-CM | POA: Diagnosis not present

## 2021-04-19 LAB — CBC WITH DIFFERENTIAL/PLATELET
Abs Immature Granulocytes: 0.02 10*3/uL (ref 0.00–0.07)
Basophils Absolute: 0 10*3/uL (ref 0.0–0.1)
Basophils Relative: 1 %
Eosinophils Absolute: 0.2 10*3/uL (ref 0.0–0.5)
Eosinophils Relative: 3 %
HCT: 45.7 % (ref 39.0–52.0)
Hemoglobin: 15.5 g/dL (ref 13.0–17.0)
Immature Granulocytes: 0 %
Lymphocytes Relative: 25 %
Lymphs Abs: 2 10*3/uL (ref 0.7–4.0)
MCH: 28.4 pg (ref 26.0–34.0)
MCHC: 33.9 g/dL (ref 30.0–36.0)
MCV: 83.7 fL (ref 80.0–100.0)
Monocytes Absolute: 0.5 10*3/uL (ref 0.1–1.0)
Monocytes Relative: 7 %
Neutro Abs: 5.1 10*3/uL (ref 1.7–7.7)
Neutrophils Relative %: 64 %
Platelets: 146 10*3/uL — ABNORMAL LOW (ref 150–400)
RBC: 5.46 MIL/uL (ref 4.22–5.81)
RDW: 13.8 % (ref 11.5–15.5)
WBC: 7.9 10*3/uL (ref 4.0–10.5)
nRBC: 0 % (ref 0.0–0.2)

## 2021-04-19 LAB — BASIC METABOLIC PANEL
Anion gap: 6 (ref 5–15)
BUN: 8 mg/dL (ref 6–20)
CO2: 29 mmol/L (ref 22–32)
Calcium: 9.6 mg/dL (ref 8.9–10.3)
Chloride: 102 mmol/L (ref 98–111)
Creatinine, Ser: 0.79 mg/dL (ref 0.61–1.24)
GFR, Estimated: >60 mL/min (ref >60)
Glucose, Bld: 102 mg/dL — ABNORMAL HIGH (ref 70–99)
Potassium: 3.8 mmol/L (ref 3.5–5.1)
Sodium: 137 mmol/L (ref 135–145)

## 2021-04-19 LAB — TROPONIN I (HIGH SENSITIVITY): Troponin I (High Sensitivity): 5 ng/L (ref <18)

## 2021-04-19 NOTE — ED Triage Notes (Signed)
Patient reports central chest pain with SOB onset today , no cough or fever , denies emesis or diaphoresis , recent cervical surgery last 04/16/21.

## 2021-04-19 NOTE — ED Provider Notes (Signed)
[name] EMERGENCY DEPT Provider Note   CSN: [id number] Arrival date & time: 04/19/21  2036     History Chief Complaint  Patient presents with  . Post-op Problem    Alan Sanders is a 50 y.o. male.  50 yo M with a chief complaints of painful and difficulty swallowing.  Been going on for a day.  Patient had a anterior cervical fusion done by Dr. Reatha Armour 4 days ago.  Had episode where he fell yesterday as well.  Cannot fit his walker into the bathroom and sounds like he lost his footing and went down.  Denies any injury in the fall.  Denies fevers or chills.  Denies nausea or vomiting.  He does feel like he has some trouble breathing off and on.  He reportedly called his neurosurgeon who told him he needed to have an imaging study done so he could be seen in the emergency department by neurosurgery.  He felt like the wait was too long at [facility] and so came here.  He denies any numbness or weakness to the arms or legs.  The history is provided by the patient.  Illness Severity:  Moderate Onset quality:  Sudden Duration:  3 days Timing:  Constant Progression:  Worsening Chronicity:  New Associated symptoms: no abdominal pain, no chest pain, no congestion, no diarrhea, no fever, no headaches, no myalgias, no rash, no shortness of breath and no vomiting        Past Medical History:  Diagnosis Date  . Anxiety   . Arthritis    " in my back "  . Asthma   . Balance problem   . Gait difficulty   . GERD (gastroesophageal reflux disease)   . Headache    migraines  . Inguinal hernia of left side without obstruction or gangrene   . Pneumonia   . Seizures ([place])    " its been along time ,since my last seizure "  . Spleen enlarged   . Thrombocytopenia ([place])   . Tobacco abuse     Patient Active Problem List   Diagnosis Date Noted  . Cervical myelopathy ([place]) 04/16/2021  . Seasonal allergic rhinitis due to pollen 02/18/2021  . Dysthymia 02/18/2021  . Cough  02/13/2015  . Thrombocytopenia ([place]) 02/13/2015  . Chest pain 02/12/2015  . Asthma 02/12/2015  . Tobacco abuse 02/12/2015  . Nausea vomiting and diarrhea 02/12/2015  . Pain in the chest     Past Surgical History:  Procedure Laterality Date  . ANTERIOR CERVICAL DECOMP/DISCECTOMY FUSION N/A 04/16/2021   Procedure: Cervical three-four  Anterior cervical decompression/discectomy/fusion;  Surgeon: Dawley, Theodoro Doing, DO;  Location: [facility];  Service: Neurosurgery;  Laterality: N/A;  . HERNIA REPAIR     inguinal  . SURAL NERVE BX Right 12/11/2020   Procedure: SURAL NERVE BIOPSY;  Surgeon: Karsten Ro, DO;  Location: [facility];  Service: Neurosurgery;  Laterality: Right;       Family History  Problem Relation Age of Onset  . Diabetes Mother   . Hypertension Mother   . COPD Mother   . Alcoholism Brother     Social History   Tobacco Use  . Smoking status: Current Every Day Smoker    Packs/day: 0.50    Years: 24.00    Pack years: 12.00    Types: Cigarettes  . Smokeless tobacco: Current User    Types: Snuff  Vaping Use  . Vaping Use: Never used  Substance Use Topics  . Alcohol use:  No  . Drug use: No    Home Medications Prior to Admission medications   Medication Sig Start Date End Date Taking? Authorizing Provider  amoxicillin-clavulanate (AUGMENTIN) 400-57 MG/5ML suspension Take 10.9 mLs (875 mg total) by mouth 2 (two) times daily for 7 days. 04/20/21 04/27/21 Yes Milicent Acheampong, DO  fluticasone (FLONASE) 50 MCG/ACT nasal spray PLACE 2 SPRAYS INTO BOTH NOSTRILS DAILY. Patient taking differently: Place 2 sprays into both nostrils daily as needed for allergies. 02/18/21 02/18/22  Libby Maw, MD  gabapentin (NEURONTIN) 300 MG capsule Take 1 capsule (300 mg total) by mouth 3 (three) times daily. 04/17/21   Dawley, Theodoro Doing, DO  HYDROcodone-acetaminophen (NORCO) 10-325 MG tablet Take 1 tablet by mouth every 4 (four) hours as needed for moderate pain ((score 4 to 6)). 04/17/21   Dawley,  Troy C, DO  methocarbamol (ROBAXIN) 500 MG tablet Take 1 tablet (500 mg total) by mouth every 8 (eight) hours as needed for muscle spasms. 04/17/21   Dawley, Troy C, DO  Oxycodone HCl 10 MG TABS Take 1 tablet (10 mg total) by mouth every 3 (three) hours as needed for severe pain ((score 7 to 10)). 04/17/21   Dawley, Troy C, DO  PARoxetine (PAXIL) 20 MG tablet TAKE 1 TABLET (20 MG TOTAL) BY MOUTH DAILY. 02/18/21 02/18/22  Libby Maw, MD    Allergies    Patient has no known allergies.  Review of Systems   Review of Systems  Constitutional: Negative for chills and fever.  HENT: Positive for trouble swallowing. Negative for congestion and facial swelling.   Eyes: Negative for discharge and visual disturbance.  Respiratory: Negative for shortness of breath.   Cardiovascular: Negative for chest pain and palpitations.  Gastrointestinal: Negative for abdominal pain, diarrhea and vomiting.  Musculoskeletal: Negative for arthralgias and myalgias.  Skin: Negative for color change and rash.  Neurological: Negative for tremors, syncope and headaches.  Psychiatric/Behavioral: Negative for confusion and dysphoric mood.    Physical Exam Updated Vital Signs BP 138/81   Pulse (!) 48   Temp 98.4 F (36.9 C) (Oral)   Resp 18   Ht 6\' 1"  (1.854 m)   Wt 105.7 kg   SpO2 96%   BMI 30.74 kg/m   Physical Exam Vitals and nursing note reviewed.  Constitutional:      Appearance: He is well-developed.  HENT:     Head: Normocephalic and atraumatic.  Eyes:     Pupils: Pupils are equal, round, and reactive to light.  Neck:     Vascular: No JVD.     Comments: Right sided incision without erythema or drainage.  No fluctuance or induration. Cardiovascular:     Rate and Rhythm: Normal rate and regular rhythm.     Heart sounds: No murmur heard. No friction rub. No gallop.   Pulmonary:     Effort: No respiratory distress.     Breath sounds: No wheezing.  Abdominal:     General: There is no  distension.     Tenderness: There is no abdominal tenderness. There is no guarding or rebound.  Musculoskeletal:        General: Normal range of motion.     Cervical back: Normal range of motion and neck supple.  Skin:    Coloration: Skin is not pale.     Findings: No rash.  Neurological:     Mental Status: He is alert and oriented to person, place, and time.  Psychiatric:  Behavior: Behavior normal.     ED Results / Procedures / Treatments   Labs (all labs ordered are listed, but only abnormal results are displayed) Labs Reviewed - No data to display  EKG None  Radiology CT Soft Tissue Neck W Contrast  Result Date: 04/20/2021 CLINICAL DATA:  Initial evaluation for postop. EXAM: CT NECK WITH CONTRAST TECHNIQUE: Multidetector CT imaging of the neck was performed using the standard protocol following the bolus administration of intravenous contrast. CONTRAST:  10mL OMNIPAQUE IOHEXOL 300 MG/ML  SOLN COMPARISON:  None. FINDINGS: Pharynx and larynx: Oral cavity within normal limits. No acute abnormality about the dentition. Palatine tonsils symmetric and within normal limits. Oropharynx and nasopharynx within normal limits. Epiglottis normal. Vallecula clear. Minimal postoperative retropharyngeal stranding with a single locule of gas seen at the discectomy site. Mild postoperative stranding also seen extending along the right-sided surgical approach. 3.2 cm focus of postoperative emphysema seen within the subcutaneous fat of the right anterolateral neck (series 2, image 71). No discrete hematoma or other collection. Remainder of the hypopharynx and supraglottic larynx within normal limits. Glottis normal. Subglottic airway patent clear. Visualized upper esophagus within normal limits. Salivary glands: Parotid and left submandibular gland within normal limits. Mild asymmetric enlargement and hyperenhancement of the right submandibular gland (series 2, image 60). While this finding could be  reactive in nature, possible acute sialoadenitis could also have this appearance. No obstructive stone or abscess. Thyroid: Scattered postoperative stranding partially surrounds the thyroid and overlying strap musculature. Thyroid otherwise unremarkable. Lymph nodes: No enlarged or pathologic adenopathy within the neck. Vascular: Normal intravascular enhancement seen throughout the neck. Limited intracranial: Unremarkable. Visualized orbits: Unremarkable. Mastoids and visualized paranasal sinuses: Visualized paranasal sinuses are clear. Visualized mastoid air cells and middle ear cavities are well pneumatized and free of fluid. Skeleton: Recent ACDF at C3-4. No visible hardware complication. No acute osseous finding. No discrete or worrisome osseous lesions. Upper chest: Visualized upper chest demonstrates no acute finding. Partially visualized lungs are largely clear. Other: None. IMPRESSION: 1. Postoperative changes from recent ACDF at K2-4 without complication. No discrete hematoma or other collection. 2. Mild asymmetric enlargement and hyperenhancement of the right submandibular gland indeterminate. While this finding could be reactive in nature related to recent surgery, possible acute sialoadenitis could also have this appearance. No obstructive stone or abscess. 3. No other acute abnormality within the neck. Electronically Signed   By: Jeannine Boga M.D.   On: 04/20/2021 01:47   DG Chest Port 1 View  Result Date: 04/20/2021 CLINICAL DATA:  Shortness of breath EXAM: PORTABLE CHEST 1 VIEW COMPARISON:  12/11/2020 FINDINGS: Right lung grossly clear. Airspace disease at left lung base. Stable cardiomediastinal silhouette. No pneumothorax. IMPRESSION: Airspace disease at left lung base which may reflect atelectasis or pneumonia Electronically Signed   By: Donavan Foil M.D.   On: 04/20/2021 00:12    Procedures Procedures   Medications Ordered in ED Medications  iohexol (OMNIPAQUE) 300 MG/ML  solution 75 mL (75 mLs Intravenous Contrast Given 04/20/21 0105)    ED Course  I have reviewed the triage vital signs and the nursing notes.  Pertinent labs & imaging results that were available during my care of the patient were reviewed by me and considered in my medical decision making (see chart for details).    MDM Rules/Calculators/A&P                          50 yo M status post anterior  cervical fusion with a chief complaints of difficulty swallowing.  Reportedly he was told by his neurosurgeon to go to [facility] to get an imaging study done.  Due to the weight he decided to come here instead.  He had blood work done at [facility] that I have reviewed that is largely unremarkable no significant anemia or leukocytosis no significant electrolyte abnormality.  With him describing shortness of breath will obtain a chest x-ray.  I discussed the case with neurosurgery on-call Alan Peers, PA-C recommended CT scan here to evaluate for postop fluid collection.  If the patient is able to tolerate p.o. recommended thin liquids and follow-up with his primary neurosurgeon.  Patient's chest x-ray was concerning for possible pneumonia.  The patient tells me he has been coughing quite a bit since the surgery.  Will start on antibiotics.  CT scan without obvious postsurgical fluid collection.  There was some concern about a reactive salivary gland.   2:01 AM:  I have discussed the diagnosis/risks/treatment options with the patient and believe the pt to be eligible for discharge home to follow-up with PCP. We also discussed returning to the ED immediately if new or worsening sx occur. We discussed the sx which are most concerning (e.g., sudden worsening pain, fever, inability to tolerate by mouth) that necessitate immediate return. Medications administered to the patient during their visit and any new prescriptions provided to the patient are listed below.  Medications given during this visit Medications   iohexol (OMNIPAQUE) 300 MG/ML solution 75 mL (75 mLs Intravenous Contrast Given 04/20/21 0105)     The patient appears reasonably screen and/or stabilized for discharge and I doubt any other medical condition or other [facility] requiring further screening, evaluation, or treatment in the ED at this time prior to discharge.   Final Clinical Impression(s) / ED Diagnoses Final diagnoses:  Dysphagia, unspecified type  Aspiration pneumonia of left lower lobe due to gastric secretions [facility])    Rx / DC Orders ED Discharge Orders         Ordered    amoxicillin-clavulanate (AUGMENTIN) 400-57 MG/5ML suspension  2 times daily        04/20/21 0200           Deno Etienne, DO 04/20/21 0201

## 2021-04-19 NOTE — ED Notes (Signed)
Pt stated they are leaving, they are in a lot of pain. Moving OTF.

## 2021-04-19 NOTE — ED Provider Notes (Signed)
Emergency Medicine Provider Triage Evaluation Note  Alan Sanders , a 50 y.o. male  was evaluated in triage.  Pt complains of shortness of breath, postop complication.  Underwent C3-4 anterior cervical fusion on 04/16/2021 with Dr. Reatha Armour with [facility] neuro[facility].  He has had some generalized chest pain.  He feels short of breath like his throat is tight.  Has been unable to tolerate any p.o. intake over the last 24 hours as he states he "It wont go down."  He called neurosurgery who told to come here for imaging.  No fever  Review of Systems  Positive: SOB, neck swelling, CP Negative: cough, paresthesias, weakness  Physical Exam  There were no vitals taken for this visit. Gen:   Awake, no distress   Neck:  In C-collar Resp:  Normal effort  MSK:   Moves extremities without difficulty  Other:    Medical Decision Making  Medically screening exam initiated at 6:56 PM.  Appropriate orders placed.  Alan Sanders was informed that the remainder of the evaluation will be completed by another provider, this initial triage assessment does not replace that evaluation, and the importance of remaining in the ED until their evaluation is complete.  Shortness of breath, postop complication  Labs and imaging ordered   Jaimy Kliethermes A, PA-C 04/19/21 1905    Breck Coons, MD 04/19/21 2055

## 2021-04-19 NOTE — ED Triage Notes (Addendum)
Cervical neck surgery on 04/16/21. Reports that he spoke with his surgeon and was told that he needed to come here to be evaluated due to 'food not going down right' also reports difficulty breathing and chest pressure. States that his surgeon 'requested to be called and to have pt transferred [facility] to be [facility]'.  Pt with aspen collar in place, noted to have a surgical wound with staples in place. No distress noted.   Pt was seen at [facility] [address] and was told the wait time was 6 hours so he decided to come to this ED to be seen.  bloodwork drawn at [facility]

## 2021-04-19 NOTE — Telephone Encounter (Signed)
[facility] is calling to get verbal orders for Home Health/OT starting next week. He needs orders for 2 week 2 and one week one to work on balance, endurance and energy conservation so that patient is more independent with self care and house keeping. Please call him back at [phone] (okay to leave a detailed voicemail)

## 2021-04-20 ENCOUNTER — Encounter (HOSPITAL_BASED_OUTPATIENT_CLINIC_OR_DEPARTMENT_OTHER): Payer: Self-pay

## 2021-04-20 ENCOUNTER — Other Ambulatory Visit (HOSPITAL_COMMUNITY): Payer: Self-pay

## 2021-04-20 ENCOUNTER — Emergency Department (HOSPITAL_BASED_OUTPATIENT_CLINIC_OR_DEPARTMENT_OTHER): Payer: 59

## 2021-04-20 DIAGNOSIS — Z981 Arthrodesis status: Secondary | ICD-10-CM | POA: Diagnosis not present

## 2021-04-20 DIAGNOSIS — F17210 Nicotine dependence, cigarettes, uncomplicated: Secondary | ICD-10-CM | POA: Diagnosis not present

## 2021-04-20 DIAGNOSIS — Z9889 Other specified postprocedural states: Secondary | ICD-10-CM | POA: Diagnosis not present

## 2021-04-20 DIAGNOSIS — R079 Chest pain, unspecified: Secondary | ICD-10-CM | POA: Diagnosis not present

## 2021-04-20 DIAGNOSIS — J690 Pneumonitis due to inhalation of food and vomit: Secondary | ICD-10-CM | POA: Diagnosis not present

## 2021-04-20 DIAGNOSIS — R0602 Shortness of breath: Secondary | ICD-10-CM | POA: Diagnosis not present

## 2021-04-20 IMAGING — CT CT NECK W/ CM
3 of 5 series · 11 of 35 positions shown, 13 images · IV contrast (APPLIED)
Comparison: None.

CLINICAL DATA: Initial evaluation for postop.

EXAM:
CT NECK WITH CONTRAST
TECHNIQUE: Multidetector CT imaging of the neck was performed using the
standard protocol following the bolus administration of intravenous
contrast.
CONTRAST:  75mL OMNIPAQUE IOHEXOL 300 MG/ML  SOLN

[Series 3: ax bone · axial · 0.64mm/px · z∈[-554,-430]mm · 3 of 125 slices shown, 4 images]
[im 32/125  soft-tissue]
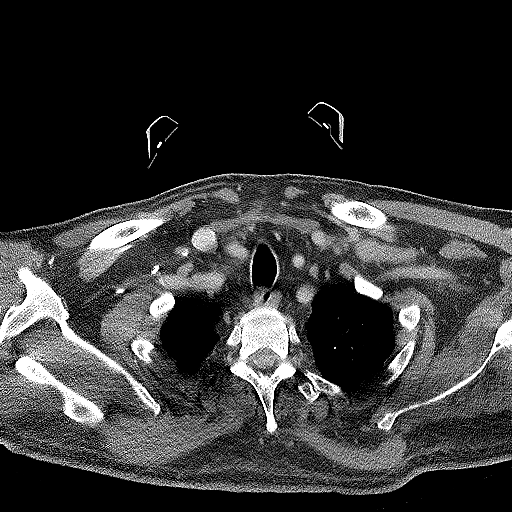
[im 32/125  bone]
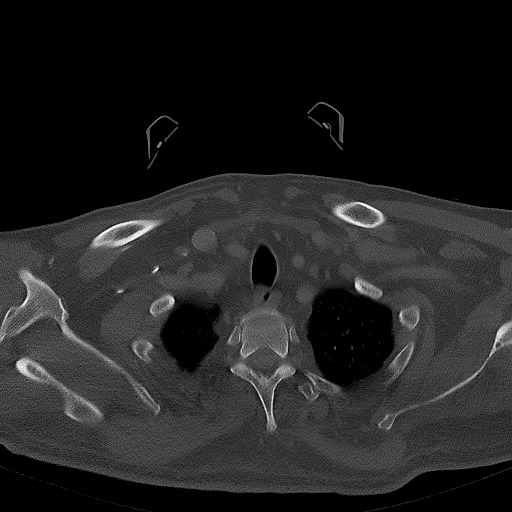
[im 63/125  bone]
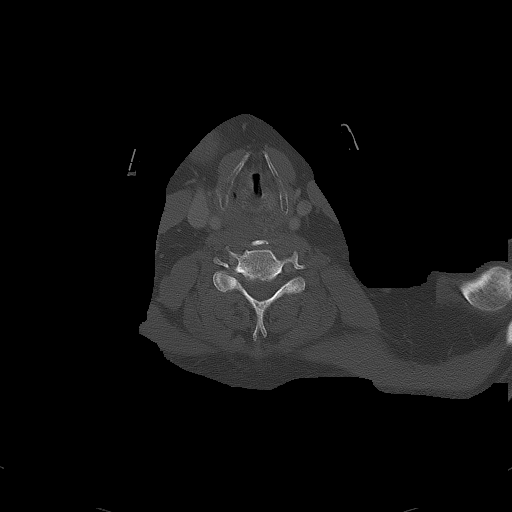
[im 94/125  bone]
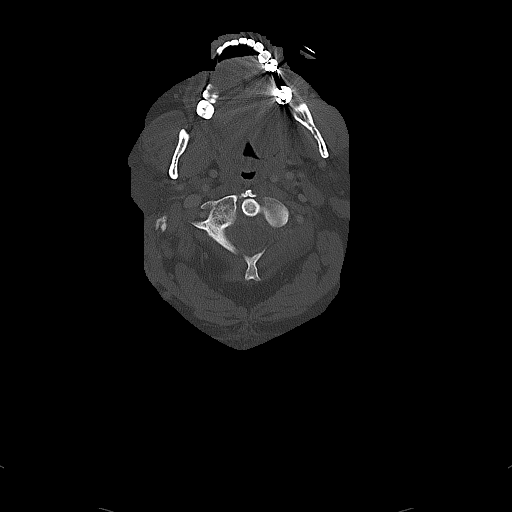

[Series 4: cor neck · coronal · 0.55mm/px · 3 of 145 slices shown]
[im 29/145  bone]
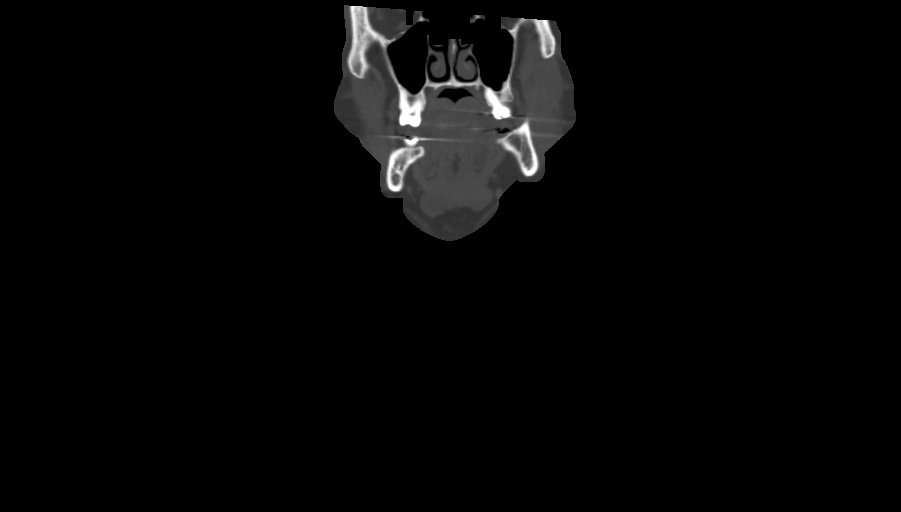
[im 58/145  bone]
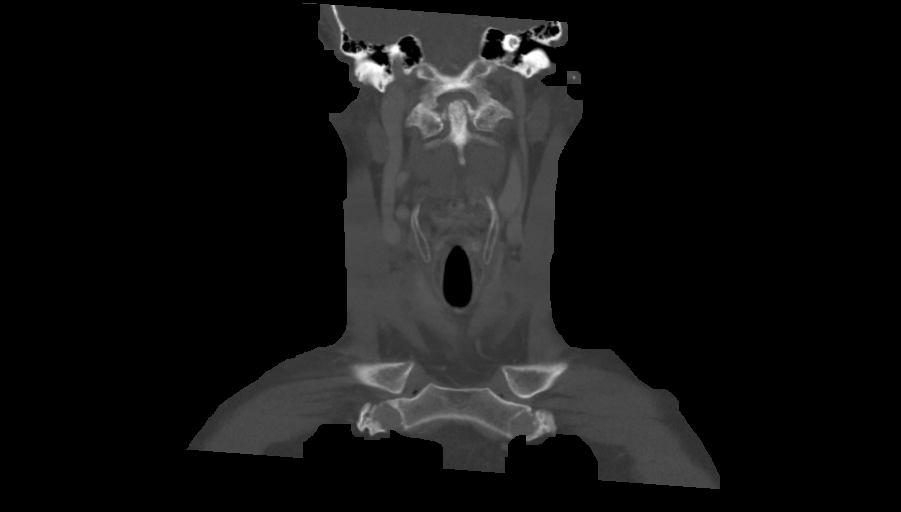
[im 87/145  bone]
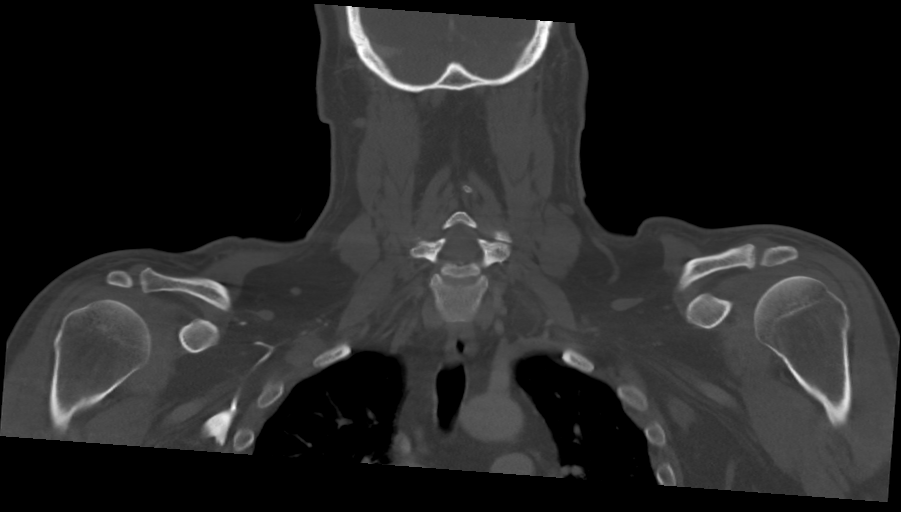

[Series 5: sag neck · sagittal · 0.51mm/px · 5 of 148 slices shown, 6 images]
[im 50/148  bone]
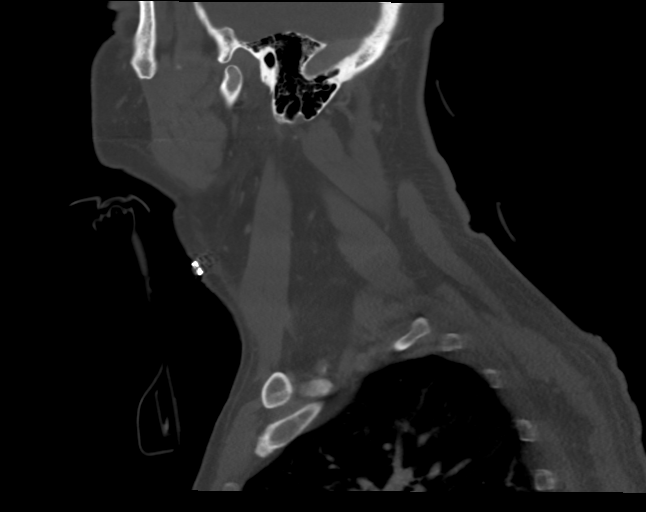
[im 62/148  bone]
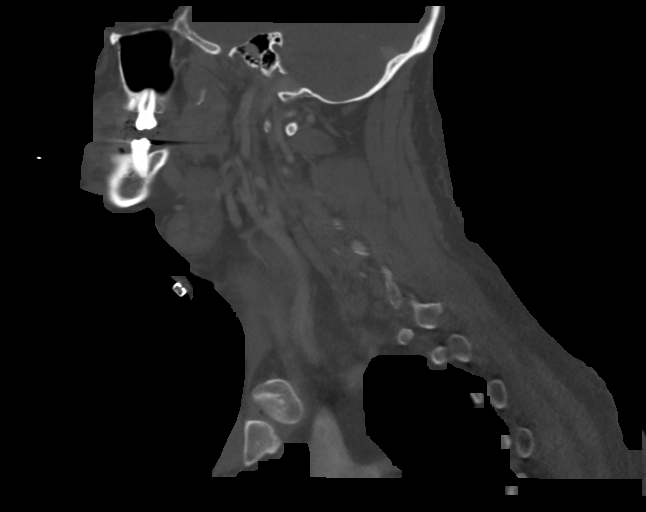
[im 74/148  soft-tissue]
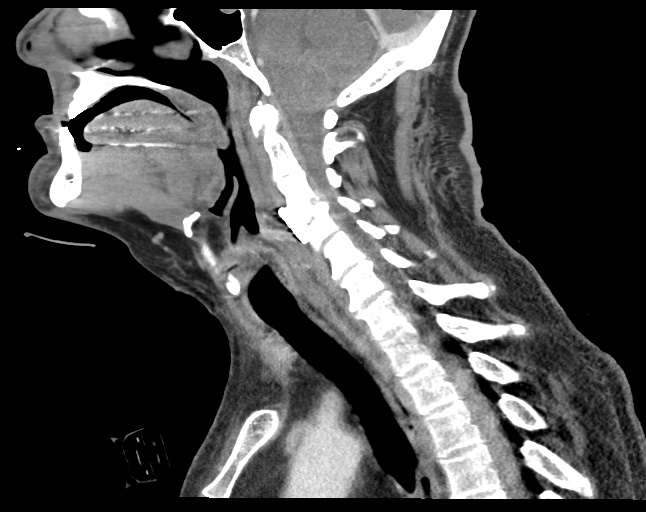
[im 74/148  bone]
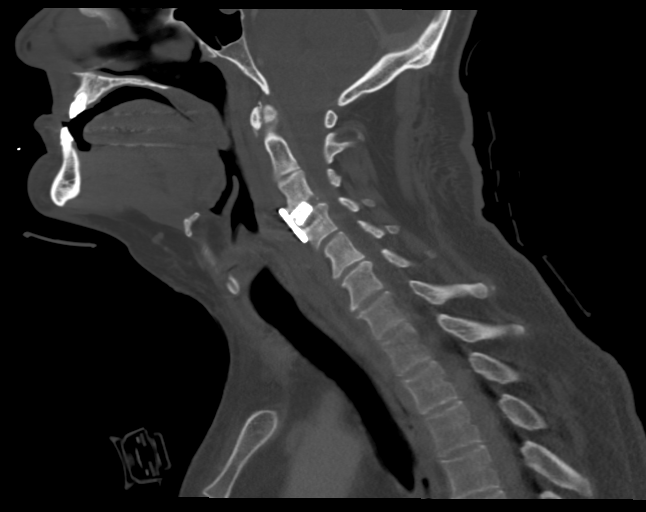
[im 86/148  bone]
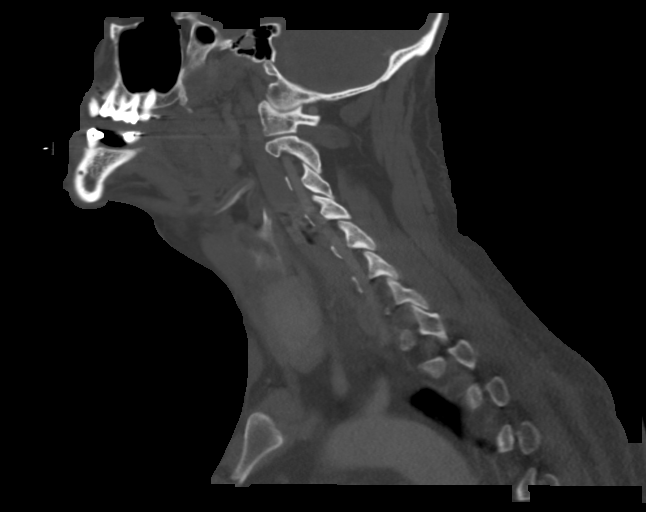
[im 99/148  bone]
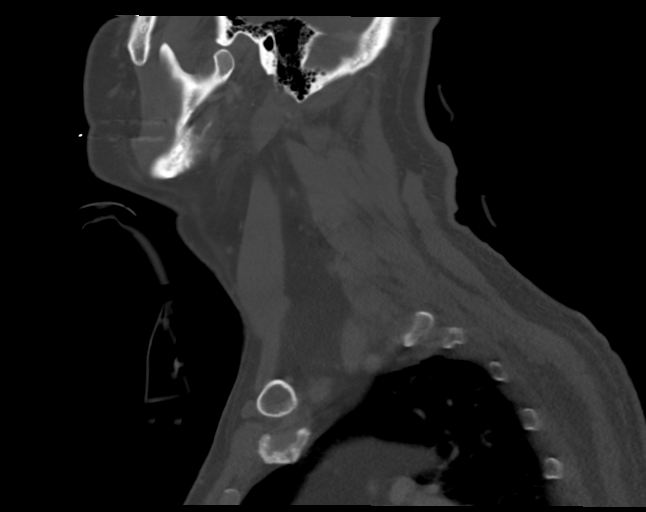

[11 of 35 positions shown; findings below may reference images not displayed]

FINDINGS: Pharynx and larynx: Oral cavity within normal limits. No acute
abnormality about the dentition. Palatine tonsils symmetric and
within normal limits. Oropharynx and nasopharynx within normal
limits. Epiglottis normal. Vallecula clear. Minimal postoperative
retropharyngeal stranding with a single locule of gas seen at the
discectomy site. Mild postoperative stranding also seen extending
along the right-sided surgical approach. 3.2 cm focus of
postoperative emphysema seen within the subcutaneous fat of the
right anterolateral neck (series 2, image 71). No discrete hematoma
or other collection. Remainder of the hypopharynx and supraglottic
larynx within normal limits. Glottis normal. Subglottic airway
patent clear. Visualized upper esophagus within normal limits.

Salivary glands: Parotid and left submandibular gland within normal
limits. Mild asymmetric enlargement and hyperenhancement of the
right submandibular gland (series 2, image 60). While this finding
could be reactive in nature, possible acute sialoadenitis could also
have this appearance. No obstructive stone or abscess.

Thyroid: Scattered postoperative stranding partially surrounds the
thyroid and overlying strap musculature. Thyroid otherwise
unremarkable.

Lymph nodes: No enlarged or pathologic adenopathy within the neck.

Vascular: Normal intravascular enhancement seen throughout the neck.

Limited intracranial: Unremarkable.

Visualized orbits: Unremarkable.

Mastoids and visualized paranasal sinuses: Visualized paranasal
sinuses are clear. Visualized mastoid air cells and middle ear
cavities are well pneumatized and free of fluid.

Skeleton: Recent ACDF at C3-4. No visible hardware complication. No
acute osseous finding. No discrete or worrisome osseous lesions.

Upper chest: Visualized upper chest demonstrates no acute finding.
Partially visualized lungs are largely clear.

Other: None.
IMPRESSION: 1. Postoperative changes from recent ACDF at C3-4 without
complication. No discrete hematoma or other collection.
2. Mild asymmetric enlargement and hyperenhancement of the right
submandibular gland indeterminate. While this finding could be
reactive in nature related to recent surgery, possible acute
sialoadenitis could also have this appearance. No obstructive stone
or abscess.
3. No other acute abnormality within the neck.

## 2021-04-20 IMAGING — DX DG CHEST 1V PORT
1 series · 1 of 1 positions shown · non-contrast
Comparison: [DATE]

CLINICAL DATA: Shortness of breath

EXAM:
PORTABLE CHEST 1 VIEW

[chest]
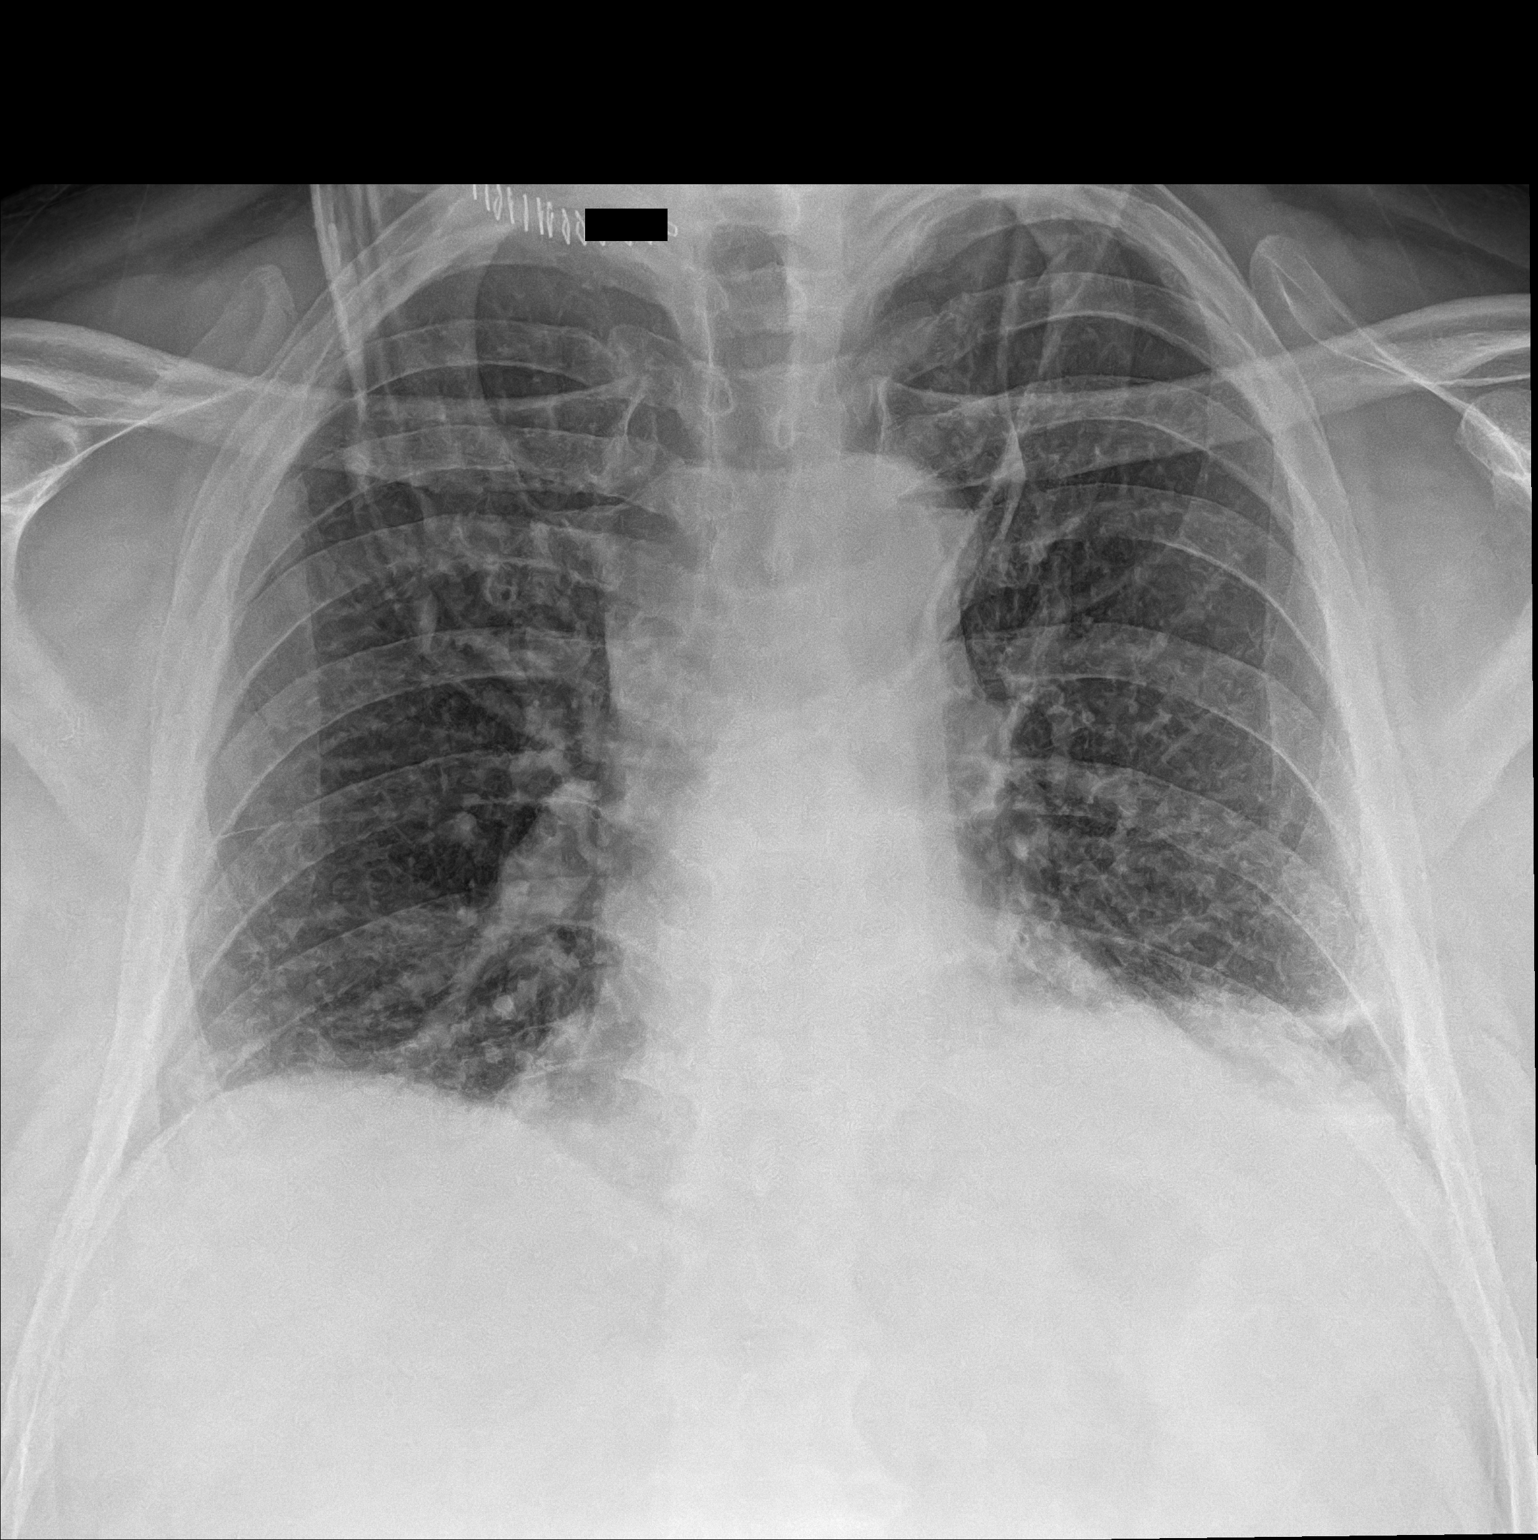

[1 of 1 positions shown; findings below may reference images not displayed]

FINDINGS: Right lung grossly clear. Airspace disease at left lung base. Stable
cardiomediastinal silhouette. No pneumothorax.
IMPRESSION: Airspace disease at left lung base which may reflect atelectasis or
pneumonia

## 2021-04-20 MED ORDER — IOHEXOL 300 MG/ML  SOLN
75.0000 mL | Freq: Once | INTRAMUSCULAR | Status: AC | PRN
  Administered 2021-04-20: 75 mL via INTRAVENOUS

## 2021-04-20 MED ORDER — AMOXICILLIN-POT CLAVULANATE 400-57 MG/5ML PO SUSR: 875.0000 mg | Freq: Two times a day (BID) | ORAL | 0 refills | Status: AC

## 2021-04-20 MED ORDER — AMOXICILLIN-POT CLAVULANATE 600-42.9 MG/5ML PO SUSR
ORAL | 0 refills | Status: DC
  Filled 2021-04-20: qty 125, 7d supply, fill #0

## 2021-04-20 NOTE — Discharge Instructions (Signed)
Thin liquid diet.  Follow up with your neurosurgeon.  Return for worsening, fever.   Adding More Calories For most people on a full liquid diet, the goal is to get 1,350 to 1,500 calories and 45 grams of protein a day.  If you need to be on a full liquid diet for a long time, you will need to be under the care of a dietitian. Ask your doctor if you can eat these foods together to add calories:  Nonfat dry milk added to your drinks Protein powders or liquid or powdered egg whites added to drinks Instant breakfast powder added to milk, puddings, custards, and milkshakes Strained meats (like the ones in baby food) added to broths Butter or margarine added to hot cereal and soups Sugar or syrup added to beverages

## 2021-04-24 ENCOUNTER — Other Ambulatory Visit (HOSPITAL_COMMUNITY): Payer: Self-pay

## 2021-04-24 DIAGNOSIS — M4802 Spinal stenosis, cervical region: Secondary | ICD-10-CM | POA: Diagnosis not present

## 2021-04-24 MED ORDER — OXYCODONE HCL 10 MG PO TABS
ORAL_TABLET | ORAL | 0 refills | Status: DC
  Filled 2021-04-24: qty 30, 5d supply, fill #0

## 2021-04-24 NOTE — Telephone Encounter (Signed)
Returned call for verbal orders no answer LMTCB 

## 2021-04-24 NOTE — Telephone Encounter (Signed)
Verbal order given  

## 2021-04-25 DIAGNOSIS — G43909 Migraine, unspecified, not intractable, without status migrainosus: Secondary | ICD-10-CM | POA: Diagnosis not present

## 2021-04-25 DIAGNOSIS — Z4789 Encounter for other orthopedic aftercare: Secondary | ICD-10-CM | POA: Diagnosis not present

## 2021-04-25 DIAGNOSIS — F419 Anxiety disorder, unspecified: Secondary | ICD-10-CM | POA: Diagnosis not present

## 2021-04-25 DIAGNOSIS — R569 Unspecified convulsions: Secondary | ICD-10-CM | POA: Diagnosis not present

## 2021-04-25 DIAGNOSIS — M4802 Spinal stenosis, cervical region: Secondary | ICD-10-CM | POA: Diagnosis not present

## 2021-04-25 DIAGNOSIS — M1990 Unspecified osteoarthritis, unspecified site: Secondary | ICD-10-CM | POA: Diagnosis not present

## 2021-04-25 DIAGNOSIS — M5011 Cervical disc disorder with radiculopathy,  high cervical region: Secondary | ICD-10-CM | POA: Diagnosis not present

## 2021-04-25 DIAGNOSIS — J45909 Unspecified asthma, uncomplicated: Secondary | ICD-10-CM | POA: Diagnosis not present

## 2021-04-25 DIAGNOSIS — M5001 Cervical disc disorder with myelopathy,  high cervical region: Secondary | ICD-10-CM | POA: Diagnosis not present

## 2021-04-26 DIAGNOSIS — M1990 Unspecified osteoarthritis, unspecified site: Secondary | ICD-10-CM | POA: Diagnosis not present

## 2021-04-26 DIAGNOSIS — J45909 Unspecified asthma, uncomplicated: Secondary | ICD-10-CM | POA: Diagnosis not present

## 2021-04-26 DIAGNOSIS — M5011 Cervical disc disorder with radiculopathy,  high cervical region: Secondary | ICD-10-CM | POA: Diagnosis not present

## 2021-04-26 DIAGNOSIS — Z4789 Encounter for other orthopedic aftercare: Secondary | ICD-10-CM | POA: Diagnosis not present

## 2021-04-26 DIAGNOSIS — M4802 Spinal stenosis, cervical region: Secondary | ICD-10-CM | POA: Diagnosis not present

## 2021-04-26 DIAGNOSIS — R569 Unspecified convulsions: Secondary | ICD-10-CM | POA: Diagnosis not present

## 2021-04-26 DIAGNOSIS — G43909 Migraine, unspecified, not intractable, without status migrainosus: Secondary | ICD-10-CM | POA: Diagnosis not present

## 2021-04-26 DIAGNOSIS — M5001 Cervical disc disorder with myelopathy,  high cervical region: Secondary | ICD-10-CM | POA: Diagnosis not present

## 2021-04-26 DIAGNOSIS — F419 Anxiety disorder, unspecified: Secondary | ICD-10-CM | POA: Diagnosis not present

## 2021-04-26 NOTE — Telephone Encounter (Signed)
Called several times with no answer or return call LM today asking for a return call.

## 2021-04-29 DIAGNOSIS — M5001 Cervical disc disorder with myelopathy,  high cervical region: Secondary | ICD-10-CM | POA: Diagnosis not present

## 2021-04-29 DIAGNOSIS — M1990 Unspecified osteoarthritis, unspecified site: Secondary | ICD-10-CM | POA: Diagnosis not present

## 2021-04-29 DIAGNOSIS — M5011 Cervical disc disorder with radiculopathy,  high cervical region: Secondary | ICD-10-CM | POA: Diagnosis not present

## 2021-04-29 DIAGNOSIS — F419 Anxiety disorder, unspecified: Secondary | ICD-10-CM | POA: Diagnosis not present

## 2021-04-29 DIAGNOSIS — J45909 Unspecified asthma, uncomplicated: Secondary | ICD-10-CM | POA: Diagnosis not present

## 2021-04-29 DIAGNOSIS — Z4789 Encounter for other orthopedic aftercare: Secondary | ICD-10-CM | POA: Diagnosis not present

## 2021-04-29 DIAGNOSIS — M4802 Spinal stenosis, cervical region: Secondary | ICD-10-CM | POA: Diagnosis not present

## 2021-04-29 DIAGNOSIS — G43909 Migraine, unspecified, not intractable, without status migrainosus: Secondary | ICD-10-CM | POA: Diagnosis not present

## 2021-04-29 DIAGNOSIS — R569 Unspecified convulsions: Secondary | ICD-10-CM | POA: Diagnosis not present

## 2021-04-30 DIAGNOSIS — J45909 Unspecified asthma, uncomplicated: Secondary | ICD-10-CM | POA: Diagnosis not present

## 2021-04-30 DIAGNOSIS — M5011 Cervical disc disorder with radiculopathy,  high cervical region: Secondary | ICD-10-CM | POA: Diagnosis not present

## 2021-04-30 DIAGNOSIS — G43909 Migraine, unspecified, not intractable, without status migrainosus: Secondary | ICD-10-CM | POA: Diagnosis not present

## 2021-04-30 DIAGNOSIS — M5001 Cervical disc disorder with myelopathy,  high cervical region: Secondary | ICD-10-CM | POA: Diagnosis not present

## 2021-04-30 DIAGNOSIS — Z4789 Encounter for other orthopedic aftercare: Secondary | ICD-10-CM | POA: Diagnosis not present

## 2021-04-30 DIAGNOSIS — R569 Unspecified convulsions: Secondary | ICD-10-CM | POA: Diagnosis not present

## 2021-04-30 DIAGNOSIS — F419 Anxiety disorder, unspecified: Secondary | ICD-10-CM | POA: Diagnosis not present

## 2021-04-30 DIAGNOSIS — M1990 Unspecified osteoarthritis, unspecified site: Secondary | ICD-10-CM | POA: Diagnosis not present

## 2021-04-30 DIAGNOSIS — M4802 Spinal stenosis, cervical region: Secondary | ICD-10-CM | POA: Diagnosis not present

## 2021-05-01 DIAGNOSIS — R569 Unspecified convulsions: Secondary | ICD-10-CM | POA: Diagnosis not present

## 2021-05-01 DIAGNOSIS — Z4789 Encounter for other orthopedic aftercare: Secondary | ICD-10-CM | POA: Diagnosis not present

## 2021-05-01 DIAGNOSIS — F419 Anxiety disorder, unspecified: Secondary | ICD-10-CM | POA: Diagnosis not present

## 2021-05-01 DIAGNOSIS — M4802 Spinal stenosis, cervical region: Secondary | ICD-10-CM | POA: Diagnosis not present

## 2021-05-01 DIAGNOSIS — J45909 Unspecified asthma, uncomplicated: Secondary | ICD-10-CM | POA: Diagnosis not present

## 2021-05-01 DIAGNOSIS — M5011 Cervical disc disorder with radiculopathy,  high cervical region: Secondary | ICD-10-CM | POA: Diagnosis not present

## 2021-05-01 DIAGNOSIS — M5001 Cervical disc disorder with myelopathy,  high cervical region: Secondary | ICD-10-CM | POA: Diagnosis not present

## 2021-05-01 DIAGNOSIS — M1990 Unspecified osteoarthritis, unspecified site: Secondary | ICD-10-CM | POA: Diagnosis not present

## 2021-05-01 DIAGNOSIS — G43909 Migraine, unspecified, not intractable, without status migrainosus: Secondary | ICD-10-CM | POA: Diagnosis not present

## 2021-05-03 DIAGNOSIS — M5001 Cervical disc disorder with myelopathy,  high cervical region: Secondary | ICD-10-CM | POA: Diagnosis not present

## 2021-05-03 DIAGNOSIS — M4802 Spinal stenosis, cervical region: Secondary | ICD-10-CM | POA: Diagnosis not present

## 2021-05-03 DIAGNOSIS — M5011 Cervical disc disorder with radiculopathy,  high cervical region: Secondary | ICD-10-CM | POA: Diagnosis not present

## 2021-05-03 DIAGNOSIS — R569 Unspecified convulsions: Secondary | ICD-10-CM | POA: Diagnosis not present

## 2021-05-03 DIAGNOSIS — J45909 Unspecified asthma, uncomplicated: Secondary | ICD-10-CM | POA: Diagnosis not present

## 2021-05-03 DIAGNOSIS — G43909 Migraine, unspecified, not intractable, without status migrainosus: Secondary | ICD-10-CM | POA: Diagnosis not present

## 2021-05-03 DIAGNOSIS — Z4789 Encounter for other orthopedic aftercare: Secondary | ICD-10-CM | POA: Diagnosis not present

## 2021-05-03 DIAGNOSIS — F419 Anxiety disorder, unspecified: Secondary | ICD-10-CM | POA: Diagnosis not present

## 2021-05-03 DIAGNOSIS — M1990 Unspecified osteoarthritis, unspecified site: Secondary | ICD-10-CM | POA: Diagnosis not present

## 2021-05-06 ENCOUNTER — Other Ambulatory Visit (HOSPITAL_COMMUNITY): Payer: Self-pay

## 2021-05-06 DIAGNOSIS — F419 Anxiety disorder, unspecified: Secondary | ICD-10-CM | POA: Diagnosis not present

## 2021-05-06 DIAGNOSIS — Z4789 Encounter for other orthopedic aftercare: Secondary | ICD-10-CM | POA: Diagnosis not present

## 2021-05-06 DIAGNOSIS — M5011 Cervical disc disorder with radiculopathy,  high cervical region: Secondary | ICD-10-CM | POA: Diagnosis not present

## 2021-05-06 DIAGNOSIS — M4802 Spinal stenosis, cervical region: Secondary | ICD-10-CM | POA: Diagnosis not present

## 2021-05-06 DIAGNOSIS — M1990 Unspecified osteoarthritis, unspecified site: Secondary | ICD-10-CM | POA: Diagnosis not present

## 2021-05-06 DIAGNOSIS — G43909 Migraine, unspecified, not intractable, without status migrainosus: Secondary | ICD-10-CM | POA: Diagnosis not present

## 2021-05-06 DIAGNOSIS — M5001 Cervical disc disorder with myelopathy,  high cervical region: Secondary | ICD-10-CM | POA: Diagnosis not present

## 2021-05-06 DIAGNOSIS — R569 Unspecified convulsions: Secondary | ICD-10-CM | POA: Diagnosis not present

## 2021-05-06 DIAGNOSIS — J45909 Unspecified asthma, uncomplicated: Secondary | ICD-10-CM | POA: Diagnosis not present

## 2021-05-06 MED ORDER — OXYCODONE HCL 10 MG PO TABS
ORAL_TABLET | ORAL | 0 refills | Status: DC
  Filled 2021-05-06: qty 30, 7d supply, fill #0

## 2021-05-07 DIAGNOSIS — G43909 Migraine, unspecified, not intractable, without status migrainosus: Secondary | ICD-10-CM | POA: Diagnosis not present

## 2021-05-07 DIAGNOSIS — Z4789 Encounter for other orthopedic aftercare: Secondary | ICD-10-CM | POA: Diagnosis not present

## 2021-05-07 DIAGNOSIS — M4802 Spinal stenosis, cervical region: Secondary | ICD-10-CM | POA: Diagnosis not present

## 2021-05-07 DIAGNOSIS — M1990 Unspecified osteoarthritis, unspecified site: Secondary | ICD-10-CM | POA: Diagnosis not present

## 2021-05-07 DIAGNOSIS — M5011 Cervical disc disorder with radiculopathy,  high cervical region: Secondary | ICD-10-CM | POA: Diagnosis not present

## 2021-05-07 DIAGNOSIS — J45909 Unspecified asthma, uncomplicated: Secondary | ICD-10-CM | POA: Diagnosis not present

## 2021-05-07 DIAGNOSIS — R569 Unspecified convulsions: Secondary | ICD-10-CM | POA: Diagnosis not present

## 2021-05-07 DIAGNOSIS — M5001 Cervical disc disorder with myelopathy,  high cervical region: Secondary | ICD-10-CM | POA: Diagnosis not present

## 2021-05-07 DIAGNOSIS — F419 Anxiety disorder, unspecified: Secondary | ICD-10-CM | POA: Diagnosis not present

## 2021-05-09 DIAGNOSIS — Z4789 Encounter for other orthopedic aftercare: Secondary | ICD-10-CM | POA: Diagnosis not present

## 2021-05-09 DIAGNOSIS — M5001 Cervical disc disorder with myelopathy,  high cervical region: Secondary | ICD-10-CM | POA: Diagnosis not present

## 2021-05-09 DIAGNOSIS — M4802 Spinal stenosis, cervical region: Secondary | ICD-10-CM | POA: Diagnosis not present

## 2021-05-09 DIAGNOSIS — F419 Anxiety disorder, unspecified: Secondary | ICD-10-CM | POA: Diagnosis not present

## 2021-05-09 DIAGNOSIS — R569 Unspecified convulsions: Secondary | ICD-10-CM | POA: Diagnosis not present

## 2021-05-09 DIAGNOSIS — M1990 Unspecified osteoarthritis, unspecified site: Secondary | ICD-10-CM | POA: Diagnosis not present

## 2021-05-09 DIAGNOSIS — G43909 Migraine, unspecified, not intractable, without status migrainosus: Secondary | ICD-10-CM | POA: Diagnosis not present

## 2021-05-09 DIAGNOSIS — M5011 Cervical disc disorder with radiculopathy,  high cervical region: Secondary | ICD-10-CM | POA: Diagnosis not present

## 2021-05-09 DIAGNOSIS — J45909 Unspecified asthma, uncomplicated: Secondary | ICD-10-CM | POA: Diagnosis not present

## 2021-05-13 ENCOUNTER — Other Ambulatory Visit (HOSPITAL_COMMUNITY): Payer: Self-pay

## 2021-05-13 ENCOUNTER — Ambulatory Visit: Payer: 59 | Admitting: Family Medicine

## 2021-05-13 ENCOUNTER — Encounter: Payer: Self-pay | Admitting: Family Medicine

## 2021-05-13 ENCOUNTER — Other Ambulatory Visit: Payer: Self-pay

## 2021-05-13 VITALS — BP 118/68 | HR 110 | Temp 97.3°F | Ht 73.0 in | Wt 232.0 lb

## 2021-05-13 DIAGNOSIS — F5105 Insomnia due to other mental disorder: Secondary | ICD-10-CM | POA: Diagnosis not present

## 2021-05-13 DIAGNOSIS — F418 Other specified anxiety disorders: Secondary | ICD-10-CM | POA: Diagnosis not present

## 2021-05-13 DIAGNOSIS — F341 Dysthymic disorder: Secondary | ICD-10-CM | POA: Diagnosis not present

## 2021-05-13 MED ORDER — METHOCARBAMOL 750 MG PO TABS
ORAL_TABLET | ORAL | 2 refills | Status: DC
  Filled 2021-05-13: qty 90, 30d supply, fill #0

## 2021-05-13 MED ORDER — PAROXETINE HCL 20 MG PO TABS
ORAL_TABLET | Freq: Every day | ORAL | 1 refills | Status: DC
  Filled 2021-05-13: qty 90, 90d supply, fill #0
  Filled 2021-07-14: qty 90, 90d supply, fill #1

## 2021-05-13 MED ORDER — OXYCODONE HCL 10 MG PO TABS
ORAL_TABLET | ORAL | 0 refills | Status: DC
  Filled 2021-05-13: qty 30, 5d supply, fill #0

## 2021-05-13 NOTE — Progress Notes (Signed)
Established Patient Office Visit  Subjective:  Patient ID: Alan Sanders, male    DOB: [birth year]  Age: 50 y.o. MRN: [id number]  CC:  Chief Complaint  Patient presents with   Follow-up    Refill/follow up on medication. Concerns about depression and not able to sleep.     HPI Alan Sanders presents for follow-up of depression and insomnia.  He was seen back in March for this issue.  Paxil was prescribed but somehow he was never up able to obtain the medication.  A lot is going on for him since that time.  He is status post cervical 3-4 anterior decompression discectomy and fusion back on May 24.  Postsurgically he developed left lower lobe pneumonia.  This was treated with Augmentin and he is Sanders better.  There is somewhat of a lingering cough but it is nonproductive.  There is been no fever.  Neck seems to be healing well after surgery.  He does not snore.  His wife is with him today and confirms that.  On top of his surgery there is been a lot of stress in the household.  His father and mother both live independently together.  Father is recovering from a broken femur.  Mom is in rehab status post multiple falls.  She is scheduled to come pends home soon.  She will not be able to care for his father when she returns home.  Past Medical History:  Diagnosis Date   Anxiety    Arthritis    " in my back "   Asthma    Balance problem    Gait difficulty    GERD (gastroesophageal reflux disease)    Headache    migraines   Inguinal hernia of left side without obstruction or gangrene    Pneumonia    Seizures ([place])    " its been along time ,since my last seizure "   Spleen enlarged    Thrombocytopenia ([place])    Tobacco abuse     Past Surgical History:  Procedure Laterality Date   ANTERIOR CERVICAL DECOMP/DISCECTOMY FUSION N/A 04/16/2021   Procedure: Cervical three-four  Anterior cervical decompression/discectomy/fusion;  Surgeon: Dawley, Theodoro Doing, DO;  Location: [facility];  Service:  Neurosurgery;  Laterality: N/A;   HERNIA REPAIR     inguinal   SURAL NERVE BX Right 12/11/2020   Procedure: SURAL NERVE BIOPSY;  Surgeon: Karsten Ro, DO;  Location: [facility];  Service: Neurosurgery;  Laterality: Right;    Family History  Problem Relation Age of Onset   Diabetes Mother    Hypertension Mother    COPD Mother    Alcoholism Brother     Social History   Socioeconomic History   Marital status: Married    Spouse name: Not on file   Number of children: 2   Years of education: Not on file   Highest education level: High school graduate  Occupational History   Not on file  Tobacco Use   Smoking status: Every Day    Packs/day: 0.50    Years: 24.00    Pack years: 12.00    Types: Cigarettes   Smokeless tobacco: Current    Types: Snuff  Vaping Use   Vaping Use: Never used  Substance and Sexual Activity   Alcohol use: No   Drug use: No   Sexual activity: Not on file  Other Topics Concern   Not on file  Social History Narrative   Lives with spouse  Sweet tea, energy drinks, Diet Coke, work 12 hr shifts   Social Determinants of Radio broadcast assistant Strain: Not on file  Food Insecurity: Not on file  Transportation Needs: Not on file  Physical Activity: Not on file  Stress: Not on file  Social Connections: Not on file  Intimate Partner Violence: Not on file    Outpatient Medications Prior to Visit  Medication Sig Dispense Refill   fluticasone (FLONASE) 50 MCG/ACT nasal spray PLACE 2 SPRAYS INTO BOTH NOSTRILS DAILY. (Patient taking differently: Place 2 sprays into both nostrils daily as needed for allergies.) 16 g 6   gabapentin (NEURONTIN) 300 MG capsule Take 1 capsule (300 mg total) by mouth 3 (three) times daily. 90 capsule 2   HYDROcodone-acetaminophen (NORCO) 10-325 MG tablet Take 1 tablet by mouth every 4 (four) hours as needed for moderate pain ((score 4 to 6)). 30 tablet 0   methocarbamol (ROBAXIN) 750 MG tablet Take one tablet by mouth three  times daily. 90 tablet 2   Oxycodone HCl 10 MG TABS Take 1 tablet (10 mg total) by mouth every 3 (three) hours as needed for severe pain ((score 7 to 10)). 30 tablet 0   Oxycodone HCl 10 MG TABS Take 1 tablet by mouth every 4-6 hours as directed 30 tablet 0   amoxicillin-clavulanate (AUGMENTIN) 600-42.9 MG/5ML suspension TAKE 7.3 ML BY MOUTH TWICE DAILY FOR 7 DAYS. DISCARD REMAINDER 125 mL 0   methocarbamol (ROBAXIN) 500 MG tablet Take 1 tablet (500 mg total) by mouth every 8 (eight) hours as needed for muscle spasms. 180 tablet 1   PARoxetine (PAXIL) 20 MG tablet TAKE 1 TABLET (20 MG TOTAL) BY MOUTH DAILY. (Patient not taking: Reported on 05/13/2021) 90 tablet 1   No facility-administered medications prior to visit.    No Known Allergies  ROS Review of Systems  Constitutional: Negative.   Respiratory: Negative.    Cardiovascular: Negative.   Gastrointestinal: Negative.   Musculoskeletal:  Positive for neck pain and neck stiffness.  Psychiatric/Behavioral:  Positive for dysphoric mood and sleep disturbance. Negative for self-injury and suicidal ideas.   Depression screen [employer] [facility] 2/9 05/13/2021 05/13/2021 02/18/2021  Decreased Interest 3 1 3   Down, Depressed, Hopeless 3 2 2   PHQ - 2 Score 6 3 5   Altered sleeping 3 - 3  Tired, decreased energy 3 - 3  Change in appetite 3 - 0  Feeling bad or failure about yourself  2 - 2  Trouble concentrating 3 - 0  Moving slowly or fidgety/restless 2 - 0  Suicidal thoughts 0 - 0  PHQ-9 Score 22 - 13  Difficult Sanders work/chores Very difficult - Somewhat difficult       Objective:    Physical Exam Constitutional:      General: He is not in acute distress.    Appearance: Normal appearance. He is not ill-appearing, toxic-appearing or diaphoretic.  HENT:     Head: Normocephalic and atraumatic.     Right Ear: Tympanic membrane, ear canal and external ear normal.     Left Ear: Tympanic membrane, ear canal and external ear normal.     Mouth/Throat:      Mouth: Mucous membranes are moist.     Pharynx: Oropharynx is clear. No oropharyngeal exudate or posterior oropharyngeal erythema.  Eyes:     General: No scleral icterus.       Right eye: No discharge.        Left eye: No discharge.     Extraocular Movements:  Extraocular movements intact.     Conjunctiva/sclera: Conjunctivae normal.     Pupils: Pupils are equal, round, and reactive to light.  Neck:   Cardiovascular:     Rate and Rhythm: Normal rate and regular rhythm.  Pulmonary:     Effort: Pulmonary effort is normal.     Breath sounds: Normal breath sounds. No rhonchi or rales.  Abdominal:     General: Bowel sounds are normal.  Musculoskeletal:     Cervical back: Tenderness present. No rigidity.  Lymphadenopathy:     Cervical: No cervical adenopathy.  Skin:    General: Skin is warm and dry.  Neurological:     Mental Status: He is alert and oriented to person, place, and time.  Psychiatric:        Mood and Affect: Mood normal.        Behavior: Behavior normal.    BP 118/68   Pulse (!) 110   Temp (!) 97.3 F (36.3 C) (Temporal)   Ht 6\' 1"  (1.854 m)   Wt 232 lb (105.2 kg)   SpO2 96%   BMI 30.61 kg/m  Wt Readings from Last 3 Encounters:  05/13/21 232 lb (105.2 kg)  04/19/21 233 lb (105.7 kg)  04/19/21 253 lb 8.5 oz (115 kg)     Health Maintenance Due  Topic Date Due   Pneumococcal Vaccine 70-66 Years old (1 - PCV) Never done   COLONOSCOPY (Pts 45-62yrs Insurance coverage will need to be confirmed)  Never done   Zoster Vaccines- Shingrix (1 of 2) Never done    There are no preventive care reminders to display for this patient.  Lab Results  Component Value Date   TSH 2.51 04/13/2020   Lab Results  Component Value Date   WBC 7.9 04/19/2021   HGB 15.5 04/19/2021   HCT 45.7 04/19/2021   MCV 83.7 04/19/2021   PLT 146 (L) 04/19/2021   Lab Results  Component Value Date   NA 137 04/19/2021   K 3.8 04/19/2021   CO2 29 04/19/2021   GLUCOSE 102 (H)  04/19/2021   BUN 8 04/19/2021   CREATININE 0.79 04/19/2021   BILITOT 0.6 04/13/2020   ALKPHOS 76 04/13/2020   AST 36 04/13/2020   ALT 65 (H) 04/13/2020   PROT 7.6 10/12/2020   ALBUMIN 4.9 04/13/2020   CALCIUM 9.6 04/19/2021   ANIONGAP 6 04/19/2021   GFR 116.03 04/13/2020   Lab Results  Component Value Date   CHOL 164 04/13/2020   Lab Results  Component Value Date   HDL 29.40 (L) 04/13/2020   Lab Results  Component Value Date   LDLCALC 109 (H) 04/13/2020   Lab Results  Component Value Date   TRIG 127.0 04/13/2020   Lab Results  Component Value Date   CHOLHDL 6 04/13/2020   Lab Results  Component Value Date   HGBA1C 5.3 04/13/2020      Assessment & Plan:   Problem List Items Addressed This Visit       Other   Dysthymia   Relevant Medications   PARoxetine (PAXIL) 20 MG tablet   Insomnia secondary to depression with anxiety - Primary   Relevant Medications   PARoxetine (PAXIL) 20 MG tablet    Meds ordered this encounter  Medications   PARoxetine (PAXIL) 20 MG tablet    Sig: TAKE 1 TABLET (20 MG TOTAL) BY MOUTH DAILY.    Dispense:  90 tablet    Refill:  1    Follow-up: Return in about  6 weeks (around 06/24/2021).  Will start Paxil.  Discussed its usage.  He will adjust the time of day taking that works best for him.  Advised that it will take time for the medication to work for him.  We will follow-up his pneumonia next visit with a chest x-ray.  Chest was clear today on exam and there was no fever.  Recheck chest x-ray at next visit.  Libby Maw, MD

## 2021-06-19 ENCOUNTER — Other Ambulatory Visit (HOSPITAL_COMMUNITY): Payer: Self-pay

## 2021-06-19 MED ORDER — METHOCARBAMOL 750 MG PO TABS
ORAL_TABLET | ORAL | 2 refills | Status: DC
  Filled 2021-06-19 – 2021-07-03 (×2): qty 90, 30d supply, fill #0

## 2021-06-19 MED ORDER — OXYCODONE HCL 10 MG PO TABS
ORAL_TABLET | ORAL | 0 refills | Status: DC
  Filled 2021-06-19: qty 30, 5d supply, fill #0

## 2021-06-20 ENCOUNTER — Other Ambulatory Visit (HOSPITAL_COMMUNITY): Payer: Self-pay

## 2021-06-24 ENCOUNTER — Ambulatory Visit: Payer: 59 | Admitting: Family Medicine

## 2021-07-03 ENCOUNTER — Other Ambulatory Visit (HOSPITAL_COMMUNITY): Payer: Self-pay

## 2021-07-03 DIAGNOSIS — M47812 Spondylosis without myelopathy or radiculopathy, cervical region: Secondary | ICD-10-CM | POA: Diagnosis not present

## 2021-07-03 DIAGNOSIS — M4802 Spinal stenosis, cervical region: Secondary | ICD-10-CM | POA: Diagnosis not present

## 2021-07-03 DIAGNOSIS — M542 Cervicalgia: Secondary | ICD-10-CM | POA: Diagnosis not present

## 2021-07-03 MED ORDER — METHYLPREDNISOLONE 4 MG PO TBPK
ORAL_TABLET | ORAL | 0 refills | Status: DC
  Filled 2021-07-03: qty 21, 6d supply, fill #0

## 2021-07-03 MED ORDER — METHOCARBAMOL 750 MG PO TABS
750.0000 mg | ORAL_TABLET | Freq: Three times a day (TID) | ORAL | 2 refills | Status: DC | PRN
  Filled 2021-07-03: qty 90, 30d supply, fill #0

## 2021-07-08 DIAGNOSIS — R519 Headache, unspecified: Secondary | ICD-10-CM | POA: Diagnosis not present

## 2021-07-08 DIAGNOSIS — J029 Acute pharyngitis, unspecified: Secondary | ICD-10-CM | POA: Diagnosis not present

## 2021-07-08 DIAGNOSIS — R509 Fever, unspecified: Secondary | ICD-10-CM | POA: Diagnosis not present

## 2021-07-08 DIAGNOSIS — R0600 Dyspnea, unspecified: Secondary | ICD-10-CM | POA: Diagnosis not present

## 2021-07-08 DIAGNOSIS — Z20828 Contact with and (suspected) exposure to other viral communicable diseases: Secondary | ICD-10-CM | POA: Diagnosis not present

## 2021-07-08 DIAGNOSIS — J3489 Other specified disorders of nose and nasal sinuses: Secondary | ICD-10-CM | POA: Diagnosis not present

## 2021-07-08 DIAGNOSIS — M791 Myalgia, unspecified site: Secondary | ICD-10-CM | POA: Diagnosis not present

## 2021-07-15 ENCOUNTER — Other Ambulatory Visit (HOSPITAL_COMMUNITY): Payer: Self-pay

## 2021-07-24 ENCOUNTER — Other Ambulatory Visit (HOSPITAL_COMMUNITY): Payer: Self-pay

## 2021-08-03 ENCOUNTER — Emergency Department (HOSPITAL_COMMUNITY)
Admission: EM | Admit: 2021-08-03 | Discharge: 2021-08-03 | Disposition: A | Discharge status: Home / Self Care | Payer: 59 | Attending: Emergency Medicine | Admitting: Emergency Medicine

## 2021-08-03 ENCOUNTER — Other Ambulatory Visit: Payer: Self-pay

## 2021-08-03 DIAGNOSIS — M25512 Pain in left shoulder: Secondary | ICD-10-CM | POA: Diagnosis not present

## 2021-08-03 DIAGNOSIS — M47812 Spondylosis without myelopathy or radiculopathy, cervical region: Secondary | ICD-10-CM | POA: Diagnosis not present

## 2021-08-03 DIAGNOSIS — W01198A Fall on same level from slipping, tripping and stumbling with subsequent striking against other object, initial encounter: Secondary | ICD-10-CM | POA: Diagnosis not present

## 2021-08-03 DIAGNOSIS — S0990XA Unspecified injury of head, initial encounter: Secondary | ICD-10-CM | POA: Insufficient documentation

## 2021-08-03 DIAGNOSIS — R519 Headache, unspecified: Secondary | ICD-10-CM | POA: Diagnosis not present

## 2021-08-03 DIAGNOSIS — M542 Cervicalgia: Secondary | ICD-10-CM | POA: Diagnosis not present

## 2021-08-03 DIAGNOSIS — M25511 Pain in right shoulder: Secondary | ICD-10-CM | POA: Insufficient documentation

## 2021-08-03 DIAGNOSIS — Z5321 Procedure and treatment not carried out due to patient leaving prior to being seen by health care provider: Secondary | ICD-10-CM | POA: Diagnosis not present

## 2021-08-03 DIAGNOSIS — M4802 Spinal stenosis, cervical region: Secondary | ICD-10-CM | POA: Diagnosis not present

## 2021-08-03 NOTE — ED Triage Notes (Signed)
Pt came in with c/o fall yesterday evening. Pt states he fell backward and hit his head. C/o bilateral neck pain, shoulder pain and upper back pain bilat. Pt also c/o headache. Pt took muscle relaxer last night

## 2021-08-24 ENCOUNTER — Encounter (HOSPITAL_COMMUNITY): Payer: Self-pay | Admitting: *Deleted

## 2021-08-24 ENCOUNTER — Emergency Department (HOSPITAL_COMMUNITY)
Admission: EM | Admit: 2021-08-24 | Discharge: 2021-08-24 | Disposition: A | Discharge status: Home / Self Care | Payer: 59 | Attending: Physician Assistant | Admitting: Physician Assistant

## 2021-08-24 ENCOUNTER — Other Ambulatory Visit (HOSPITAL_COMMUNITY): Payer: Self-pay

## 2021-08-24 DIAGNOSIS — J45909 Unspecified asthma, uncomplicated: Secondary | ICD-10-CM | POA: Diagnosis not present

## 2021-08-24 DIAGNOSIS — F17210 Nicotine dependence, cigarettes, uncomplicated: Secondary | ICD-10-CM | POA: Insufficient documentation

## 2021-08-24 DIAGNOSIS — Y92238 Other place in hospital as the place of occurrence of the external cause: Secondary | ICD-10-CM | POA: Diagnosis not present

## 2021-08-24 DIAGNOSIS — Z79899 Other long term (current) drug therapy: Secondary | ICD-10-CM | POA: Insufficient documentation

## 2021-08-24 DIAGNOSIS — S161XXA Strain of muscle, fascia and tendon at neck level, initial encounter: Secondary | ICD-10-CM | POA: Diagnosis not present

## 2021-08-24 DIAGNOSIS — S199XXA Unspecified injury of neck, initial encounter: Secondary | ICD-10-CM | POA: Diagnosis present

## 2021-08-24 MED ORDER — METHOCARBAMOL 750 MG PO TABS
750.0000 mg | ORAL_TABLET | Freq: Three times a day (TID) | ORAL | 0 refills | Status: AC | PRN
  Filled 2021-08-24: qty 21, 7d supply, fill #0
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
## 2021-08-24 NOTE — Discharge Instructions (Addendum)
You were evaluated in the Emergency Department and after careful evaluation, we did not find any emergent condition requiring admission or further testing in the hospital.  As discussed, do not think you need any additional imaging at this time.  You may experience concussion type symptoms including headache, light sensitivity, nausea.  Please make sure to watch for worsening signs including confusion, worsening vomiting, blurry vision or any other new or concerning symptoms.  You likely suffered a strain of the muscles around her neck, please take the muscle relaxer, you may take Tylenol or ibuprofen, gentle stretching.  Please follow-up with your spine specialist if needed.  Please return to the Emergency Department if you experience any worsening of your condition.  Thank you for allowing [place] to be a part of your care.
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
## 2021-08-28 ENCOUNTER — Other Ambulatory Visit: Payer: Self-pay | Admitting: Occupational Medicine

## 2021-08-28 ENCOUNTER — Other Ambulatory Visit (HOSPITAL_COMMUNITY): Payer: Self-pay

## 2021-08-28 ENCOUNTER — Other Ambulatory Visit: Payer: Self-pay

## 2021-08-28 ENCOUNTER — Ambulatory Visit: Payer: Self-pay

## 2021-08-28 DIAGNOSIS — M542 Cervicalgia: Secondary | ICD-10-CM
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
## 2021-08-28 MED ORDER — METHYLPREDNISOLONE 4 MG PO TBPK
ORAL_TABLET | ORAL | 0 refills | Status: DC
  Filled 2021-08-28: qty 21, 6d supply, fill #0

## 2021-09-05 ENCOUNTER — Other Ambulatory Visit (HOSPITAL_COMMUNITY): Payer: Self-pay

## 2021-09-18 ENCOUNTER — Other Ambulatory Visit (HOSPITAL_COMMUNITY): Payer: Self-pay

## 2021-09-18 MED ORDER — METHOCARBAMOL 750 MG PO TABS
ORAL_TABLET | ORAL | 2 refills | Status: DC
  Filled 2021-09-18: qty 90, 30d supply, fill #0

## 2021-09-18 MED ORDER — GABAPENTIN 300 MG PO CAPS
ORAL_CAPSULE | ORAL | 2 refills | Status: DC
  Filled 2021-09-18: qty 90, 30d supply, fill #0

## 2021-09-24 ENCOUNTER — Other Ambulatory Visit: Payer: Self-pay | Admitting: Family Medicine

## 2021-09-24 ENCOUNTER — Other Ambulatory Visit (HOSPITAL_COMMUNITY): Payer: Self-pay

## 2021-09-24 DIAGNOSIS — F5105 Insomnia due to other mental disorder: Secondary | ICD-10-CM

## 2021-09-24 MED ORDER — PAROXETINE HCL 20 MG PO TABS
ORAL_TABLET | Freq: Every day | ORAL | 1 refills | Status: DC
  Filled 2021-09-24: qty 90, 90d supply, fill #0

## 2021-09-24 NOTE — Telephone Encounter (Signed)
Refill on pending Rx last refill and OV 05/13/21 patient has an upcoming appointment on 11/01/21 please advise.

## 2021-11-01 ENCOUNTER — Ambulatory Visit: Payer: 59 | Admitting: Family Medicine

## 2021-11-07 DIAGNOSIS — R197 Diarrhea, unspecified: Secondary | ICD-10-CM | POA: Diagnosis not present

## 2021-11-07 DIAGNOSIS — R1012 Left upper quadrant pain: Secondary | ICD-10-CM | POA: Diagnosis not present

## 2021-11-07 DIAGNOSIS — R109 Unspecified abdominal pain: Secondary | ICD-10-CM | POA: Diagnosis not present

## 2021-11-07 DIAGNOSIS — R1032 Left lower quadrant pain: Secondary | ICD-10-CM | POA: Diagnosis not present

## 2021-11-12 DIAGNOSIS — N486 Induration penis plastica: Secondary | ICD-10-CM | POA: Diagnosis not present

## 2021-11-12 DIAGNOSIS — N401 Enlarged prostate with lower urinary tract symptoms: Secondary | ICD-10-CM | POA: Diagnosis not present

## 2021-11-12 DIAGNOSIS — N529 Male erectile dysfunction, unspecified: Secondary | ICD-10-CM | POA: Diagnosis not present

## 2021-11-20 ENCOUNTER — Other Ambulatory Visit (HOSPITAL_COMMUNITY): Payer: Self-pay

## 2021-11-20 MED ORDER — GABAPENTIN 300 MG PO CAPS
300.0000 mg | ORAL_CAPSULE | Freq: Three times a day (TID) | ORAL | 2 refills | Status: DC
  Filled 2021-11-20: qty 90, 30d supply, fill #0

## 2021-11-20 MED ORDER — METHOCARBAMOL 750 MG PO TABS
ORAL_TABLET | ORAL | 2 refills | Status: DC
  Filled 2021-11-20: qty 90, 30d supply, fill #0

## 2021-12-04 ENCOUNTER — Emergency Department (HOSPITAL_COMMUNITY): Payer: PRIVATE HEALTH INSURANCE

## 2021-12-04 ENCOUNTER — Encounter (HOSPITAL_COMMUNITY): Payer: Self-pay | Admitting: *Deleted

## 2021-12-04 ENCOUNTER — Other Ambulatory Visit: Payer: Self-pay

## 2021-12-04 ENCOUNTER — Emergency Department (HOSPITAL_COMMUNITY)
Admission: EM | Admit: 2021-12-04 | Discharge: 2021-12-05 | Disposition: A | Discharge status: Home / Self Care | Payer: PRIVATE HEALTH INSURANCE | Attending: Emergency Medicine | Admitting: Emergency Medicine

## 2021-12-04 DIAGNOSIS — M25511 Pain in right shoulder: Secondary | ICD-10-CM | POA: Diagnosis not present

## 2021-12-04 DIAGNOSIS — S161XXA Strain of muscle, fascia and tendon at neck level, initial encounter: Secondary | ICD-10-CM | POA: Diagnosis not present

## 2021-12-04 DIAGNOSIS — Y9372 Activity, wrestling: Secondary | ICD-10-CM | POA: Diagnosis not present

## 2021-12-04 DIAGNOSIS — S199XXA Unspecified injury of neck, initial encounter: Secondary | ICD-10-CM | POA: Diagnosis present
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
## 2021-12-04 IMAGING — CT CT CERVICAL SPINE W/O CM
3 of 4 series · 11 of 33 positions shown, 13 images · non-contrast
Comparison: [DATE]

CLINICAL DATA: Trauma pain



[Series 4: c_spine 2.0 st · axial · 0.32mm/px · z∈[-440,-312]mm · 3 of 96 slices shown, 4 images]
[im 16/96  soft-tissue]
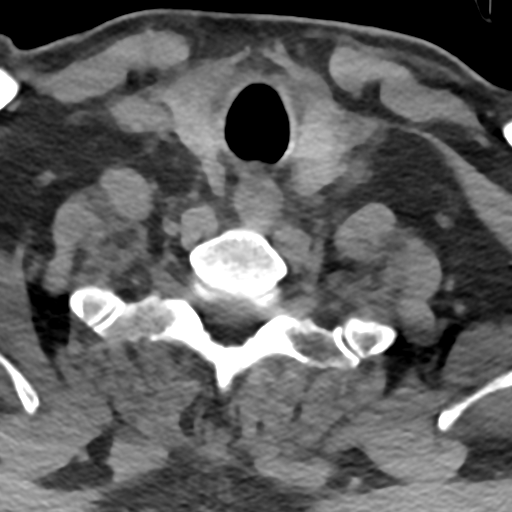
[im 16/96  bone]
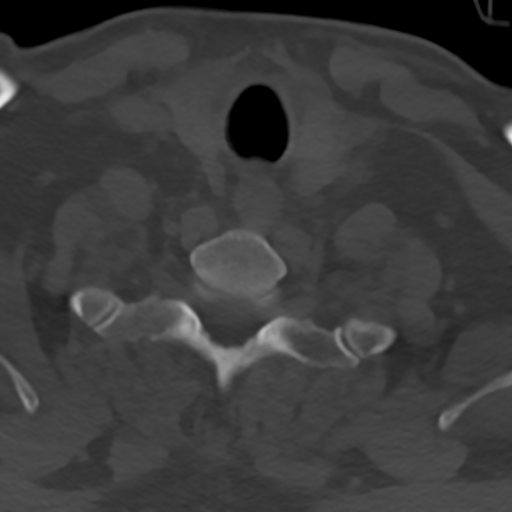
[im 48/96  bone]
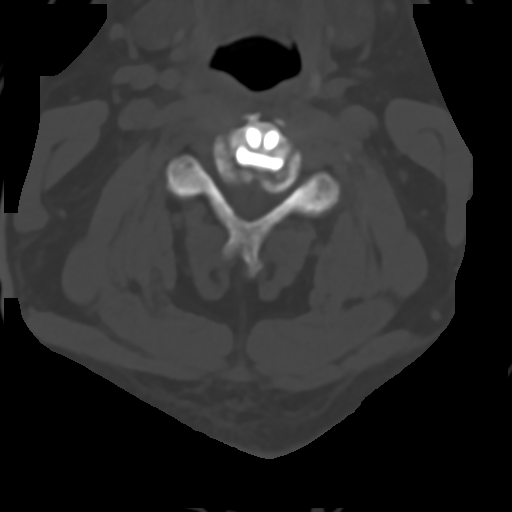
[im 80/96  bone]
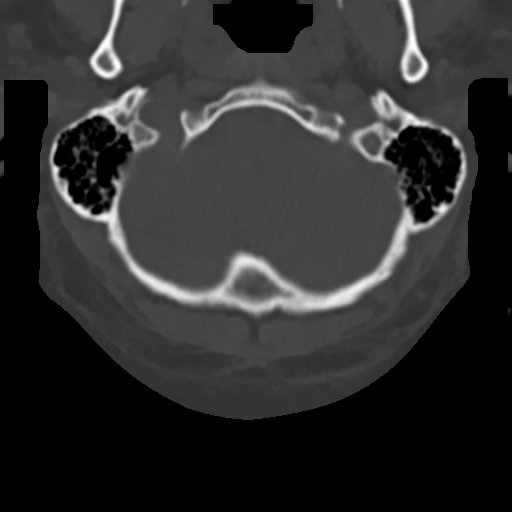

[Series 6: c_spine 2.0 sag bone · sagittal · 0.29mm/px · 5 of 61 slices shown, 6 images]
[im 21/61  bone]
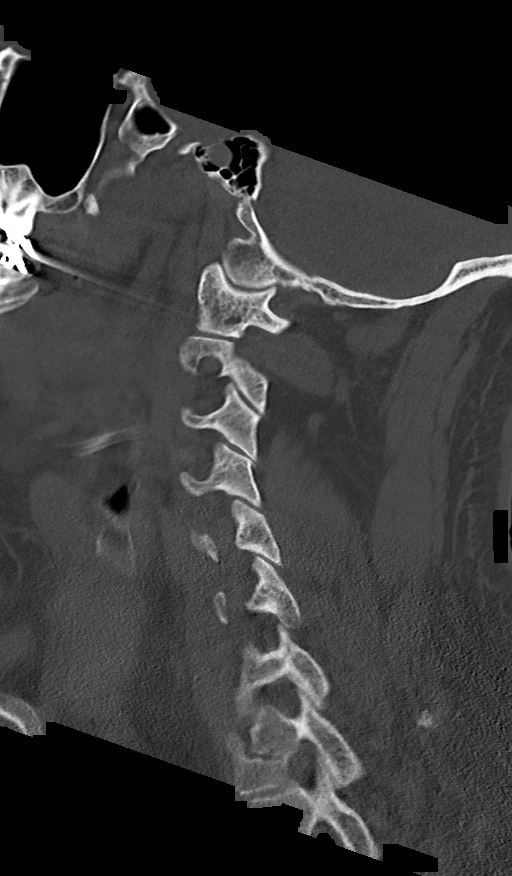
[im 26/61  bone]
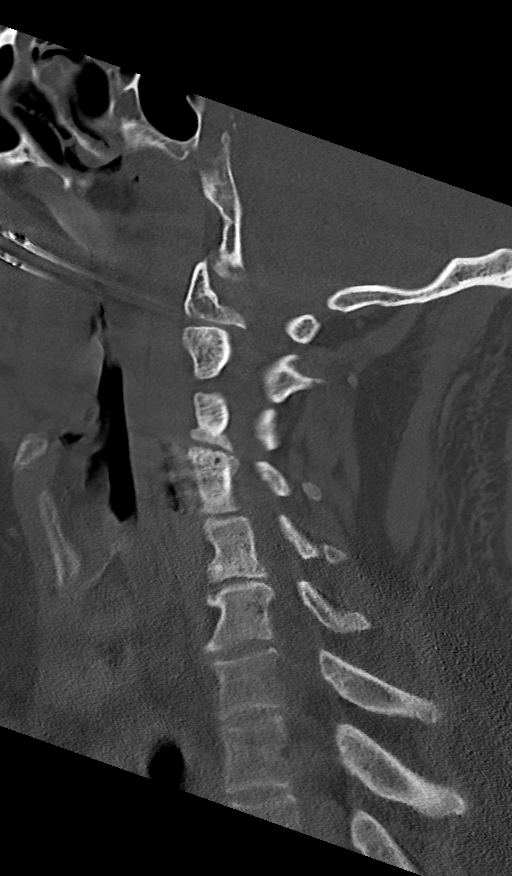
[im 31/61  soft-tissue]
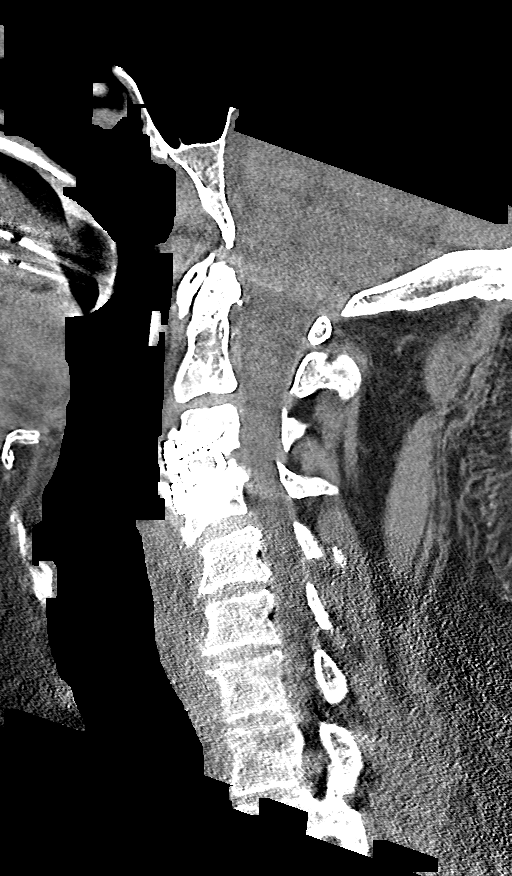
[im 31/61  bone]
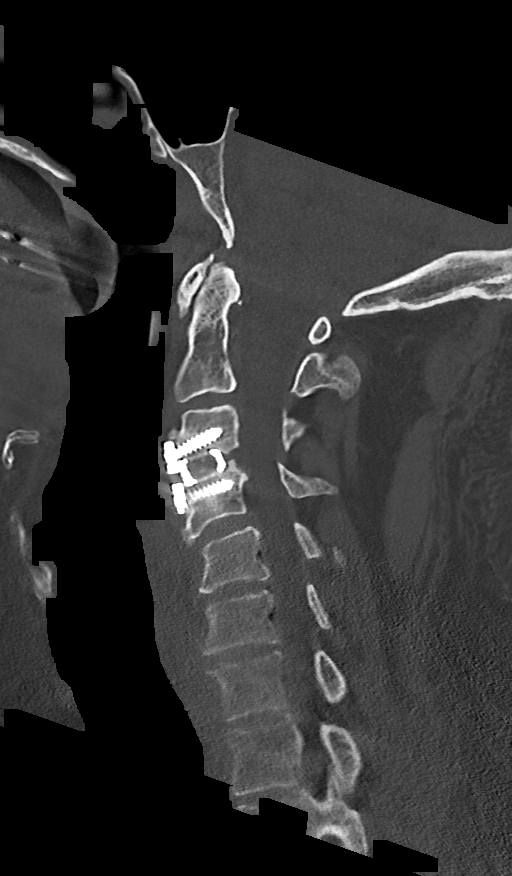
[im 36/61  bone]
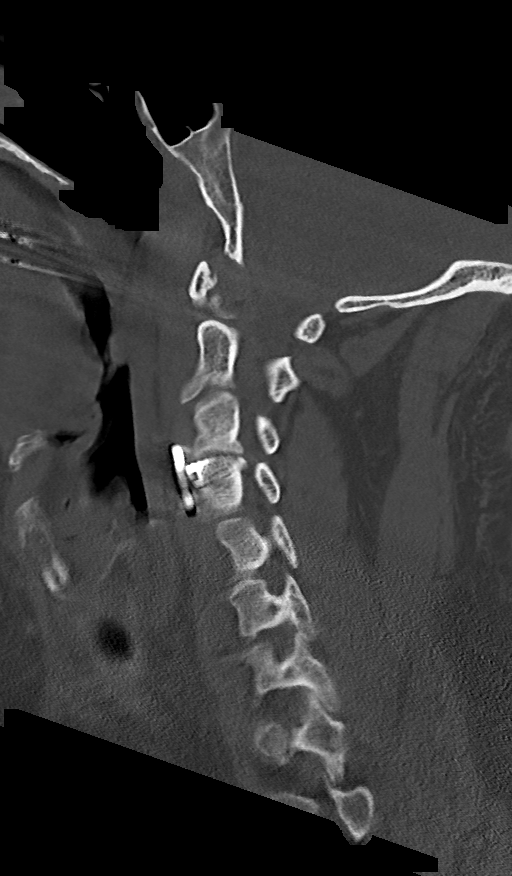
[im 41/61  bone]
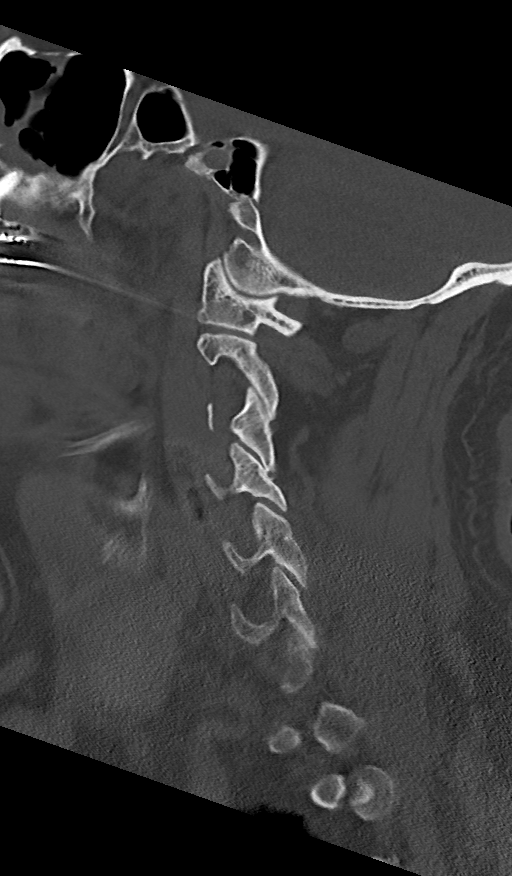

[Series 7: c_spine 2.0 cor bone · coronal · 0.28mm/px · 3 of 61 slices shown]
[im 13/61  bone]
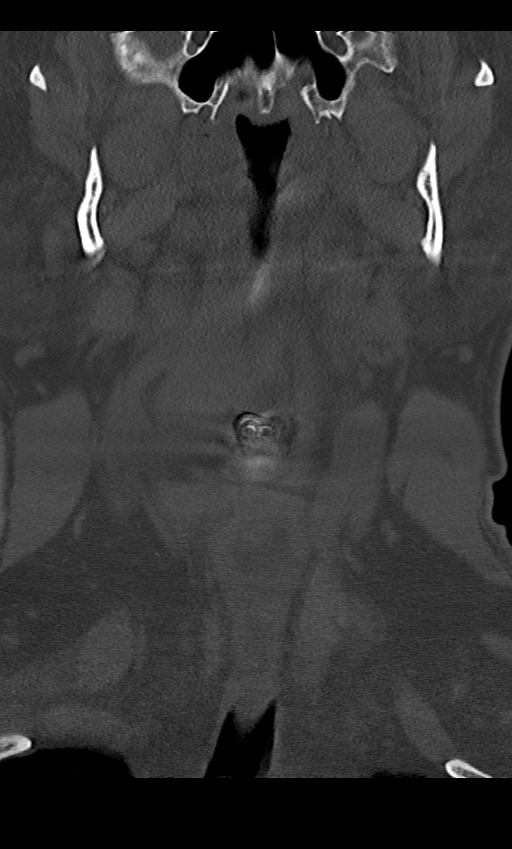
[im 25/61  bone]
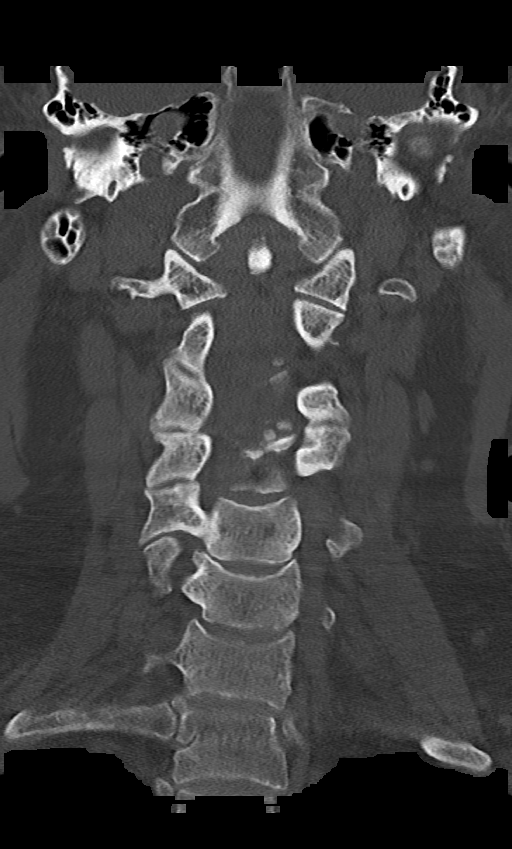
[im 37/61  bone]
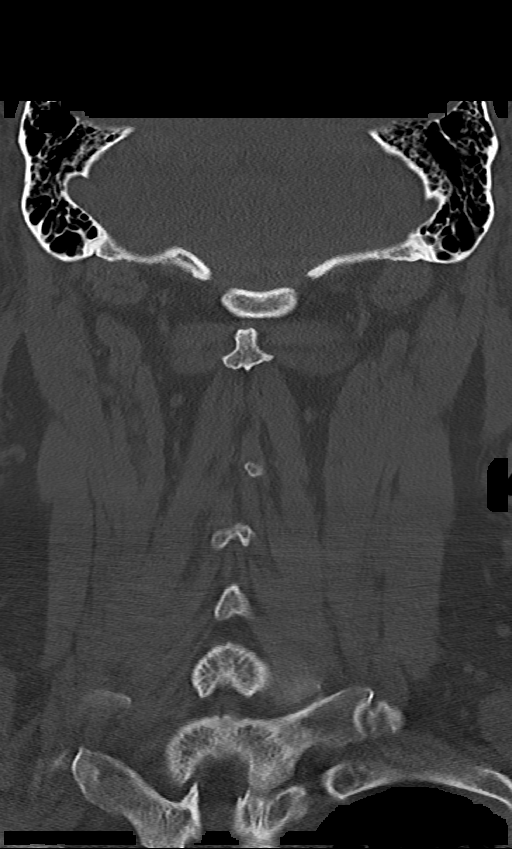

[11 of 33 positions shown; findings below may reference images not displayed]

FINDINGS: Alignment: Alignment of posterior margins of vertebral bodies is
unremarkable. There is previous anterior surgical fusion at C3-C4
level.

Skull base and vertebrae: No recent fracture is seen. There is 7 mm
smooth marginated calcification in the soft tissues posterior to
C5-C6 level suggesting ligament calcification from previous injury.

Soft tissues and spinal canal: Posterior bony spurs are causing
extrinsic pressure over the ventral margin of thecal sac at C3-C4
level and possible spinal stenosis. There is extrinsic pressure over
the ventral margin of thecal sac to a lesser extent at C5-C6 and
C6-C7 levels.

Disc levels: There is encroachment of neural foramina by bony spurs
at C3-C4 level, more so on the left side. There is mild encroachment
of neural foramina at C4-C5 level. There is encroachment of neural
foramina at C5-C6 level, more so on the right side.

Upper chest: Unremarkable.

Other: There is slightly inhomogeneous attenuation in the thyroid
without focal abnormalities.
IMPRESSION: No recent fracture is seen in the cervical spine. Cervical
spondylosis with spinal stenosis and encroachment of neural foramina
at multiple levels as described in the body of the report. There is
interval anterior surgical fusion at C3-C4 level.

## 2021-12-04 MED ORDER — ONDANSETRON HCL 4 MG/2ML IJ SOLN
4.0000 mg | Freq: Once | INTRAMUSCULAR | Status: AC
  Administered 2021-12-04: 4 mg via INTRAVENOUS
  Filled 2021-12-04: qty 2

## 2021-12-04 MED ORDER — HYDROMORPHONE HCL 1 MG/ML IJ SOLN
1.0000 mg | Freq: Once | INTRAMUSCULAR | Status: AC
  Administered 2021-12-04: 1 mg via INTRAVENOUS
  Filled 2021-12-04: qty 1

## 2021-12-04 NOTE — ED Provider Notes (Signed)
Blood pressure 123/87, pulse 87, temperature 97.8 F (36.6 C), resp. rate 16, height 6\' 2"  (1.88 m), weight 104.3 kg, SpO2 98 %.  Assuming care from Dr. Alvino Chapel.  In short, Alan Sanders is a 51 y.o. male with a chief complaint of Neck Injury .  Refer to the original H&P for additional details.  The current plan of care is to follow up on MRI c spine and reassess.  12:50 AM MRI independently reviewed along with radiology interpretation.  Patient with no acute traumatic finding.  He has residual moderate spinal stenosis with foraminal narrowing at several levels.  He also has some disc protrusion noted on MRI.  Patient is neuro intact for me on my exam.  With normal strength and sensation in the bilateral upper extremities.  Plan for Decadron.  Able to remove c-collar and patient will call his neurosurgeon tomorrow for follow-up appointment in the coming week.     Margette Fast, MD 12/05/21 [phone]
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
## 2021-12-04 NOTE — ED Notes (Signed)
Pt given drink per RN.
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
## 2021-12-04 NOTE — ED Notes (Signed)
C-COLLAR MIAMI J PLACED ON THE PT  TO C-T
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
## 2021-12-05 MED ORDER — DEXAMETHASONE SODIUM PHOSPHATE 10 MG/ML IJ SOLN
10.0000 mg | Freq: Once | INTRAMUSCULAR | Status: AC
  Administered 2021-12-05: 10 mg via INTRAVENOUS
  Filled 2021-12-05: qty 1
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
## 2021-12-06 ENCOUNTER — Emergency Department (HOSPITAL_BASED_OUTPATIENT_CLINIC_OR_DEPARTMENT_OTHER): Payer: 59 | Admitting: Radiology

## 2021-12-06 ENCOUNTER — Encounter (HOSPITAL_BASED_OUTPATIENT_CLINIC_OR_DEPARTMENT_OTHER): Payer: Self-pay | Admitting: Emergency Medicine

## 2021-12-06 ENCOUNTER — Emergency Department (HOSPITAL_BASED_OUTPATIENT_CLINIC_OR_DEPARTMENT_OTHER)
Admission: EM | Admit: 2021-12-06 | Discharge: 2021-12-06 | Disposition: A | Discharge status: Home / Self Care | Payer: 59 | Attending: Emergency Medicine | Admitting: Emergency Medicine

## 2021-12-06 ENCOUNTER — Other Ambulatory Visit (HOSPITAL_COMMUNITY): Payer: Self-pay

## 2021-12-06 ENCOUNTER — Other Ambulatory Visit: Payer: Self-pay

## 2021-12-06 DIAGNOSIS — F17210 Nicotine dependence, cigarettes, uncomplicated: Secondary | ICD-10-CM | POA: Diagnosis not present

## 2021-12-06 DIAGNOSIS — G8911 Acute pain due to trauma: Secondary | ICD-10-CM | POA: Insufficient documentation

## 2021-12-06 DIAGNOSIS — M25511 Pain in right shoulder: Secondary | ICD-10-CM | POA: Diagnosis not present

## 2021-12-06 DIAGNOSIS — J45909 Unspecified asthma, uncomplicated: Secondary | ICD-10-CM | POA: Diagnosis not present
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
## 2021-12-06 MED ORDER — HYDROMORPHONE HCL 1 MG/ML IJ SOLN
1.0000 mg | Freq: Once | INTRAMUSCULAR | Status: DC
Start: 2021-12-06 — End: 2021-12-06

## 2021-12-06 MED ORDER — KETOROLAC TROMETHAMINE 30 MG/ML IJ SOLN
30.0000 mg | Freq: Once | INTRAMUSCULAR | Status: AC
  Administered 2021-12-06: 30 mg via INTRAMUSCULAR
  Filled 2021-12-06: qty 1

## 2021-12-06 MED ORDER — KETOROLAC TROMETHAMINE 15 MG/ML IJ SOLN: 30.0000 mg | Freq: Once | INTRAMUSCULAR | Status: DC

## 2021-12-06 MED ORDER — NAPROXEN 500 MG PO TABS
500.0000 mg | ORAL_TABLET | Freq: Two times a day (BID) | ORAL | 0 refills | Status: DC
  Filled 2021-12-06: qty 30, 15d supply, fill #0

## 2021-12-06 NOTE — Discharge Instructions (Addendum)
You were evaluated in the Emergency Department and after careful evaluation, we did not find any emergent condition requiring admission or further testing in the hospital.  Your exam/testing today is overall reassuring.  Symptoms may be due to a rotator cuff injury.  Use the Naprosyn anti-inflammatory medication as directed to help with the pain.  If still hurting in 1 week recommend follow-up with an orthopedic specialist.  Please return to the Emergency Department if you experience any worsening of your condition.   Thank you for allowing [place] to be a part of your care.
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
## 2021-12-10 ENCOUNTER — Encounter: Payer: Self-pay | Admitting: Family Medicine

## 2021-12-10 ENCOUNTER — Ambulatory Visit (INDEPENDENT_AMBULATORY_CARE_PROVIDER_SITE_OTHER): Payer: 59

## 2021-12-10 ENCOUNTER — Ambulatory Visit: Payer: 59 | Admitting: Family Medicine

## 2021-12-10 ENCOUNTER — Other Ambulatory Visit: Payer: Self-pay

## 2021-12-10 VITALS — BP 132/84 | HR 89 | Temp 97.1°F | Ht 74.0 in | Wt 234.4 lb

## 2021-12-10 DIAGNOSIS — Z720 Tobacco use: Secondary | ICD-10-CM

## 2021-12-10 DIAGNOSIS — Y990 Civilian activity done for income or pay: Secondary | ICD-10-CM

## 2021-12-10 DIAGNOSIS — J4520 Mild intermittent asthma, uncomplicated: Secondary | ICD-10-CM | POA: Diagnosis not present

## 2021-12-10 DIAGNOSIS — M25511 Pain in right shoulder: Secondary | ICD-10-CM

## 2021-12-10 DIAGNOSIS — F341 Dysthymic disorder: Secondary | ICD-10-CM

## 2021-12-10 DIAGNOSIS — R059 Cough, unspecified: Secondary | ICD-10-CM | POA: Diagnosis not present

## 2021-12-10 DIAGNOSIS — R062 Wheezing: Secondary | ICD-10-CM | POA: Diagnosis not present

## 2021-12-10 IMAGING — DX DG CHEST 2V
2 series · 2 of 2 positions shown · non-contrast
Comparison: [DATE]

CLINICAL DATA: Cough and wheezing

EXAM:
CHEST - 2 VIEW

[chest pa]
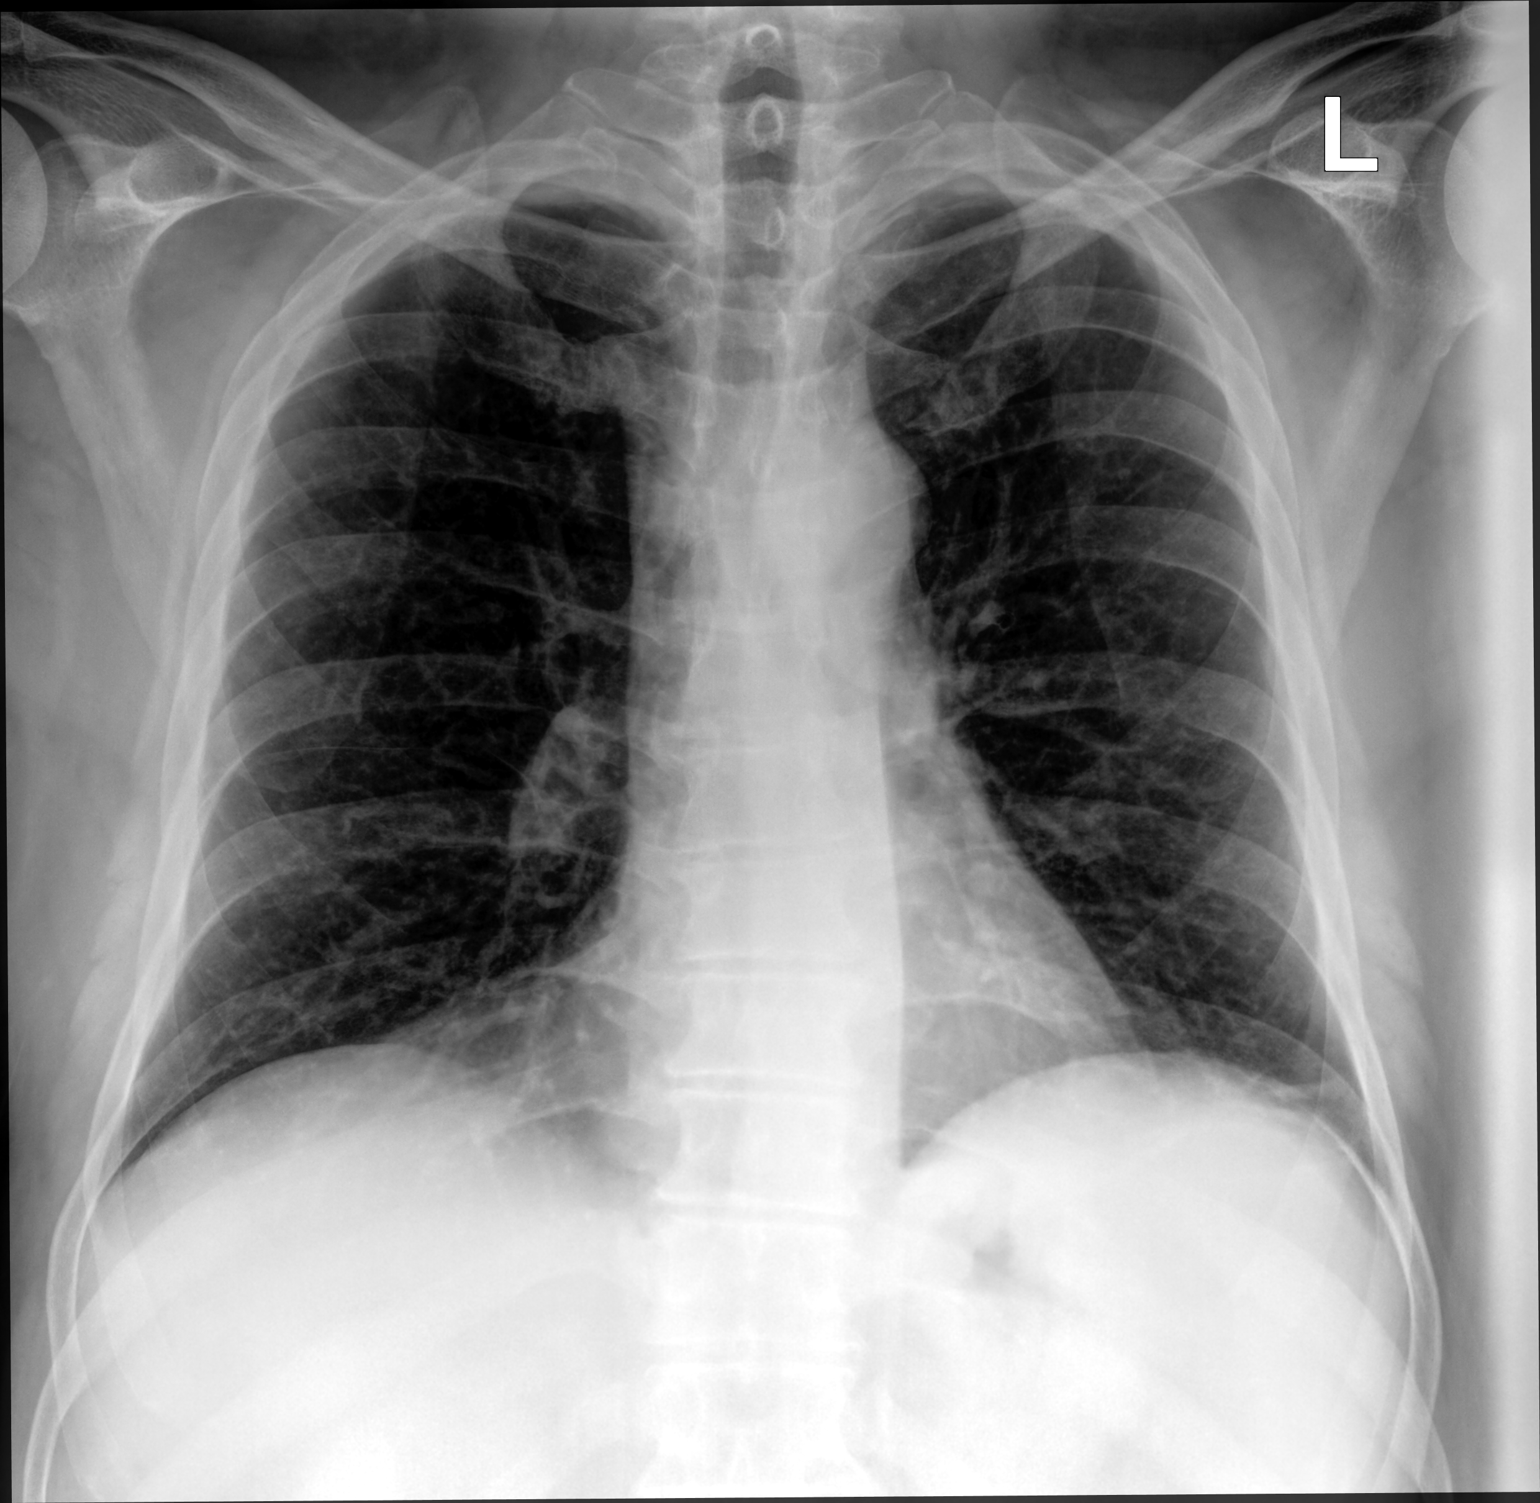

[chest lat]
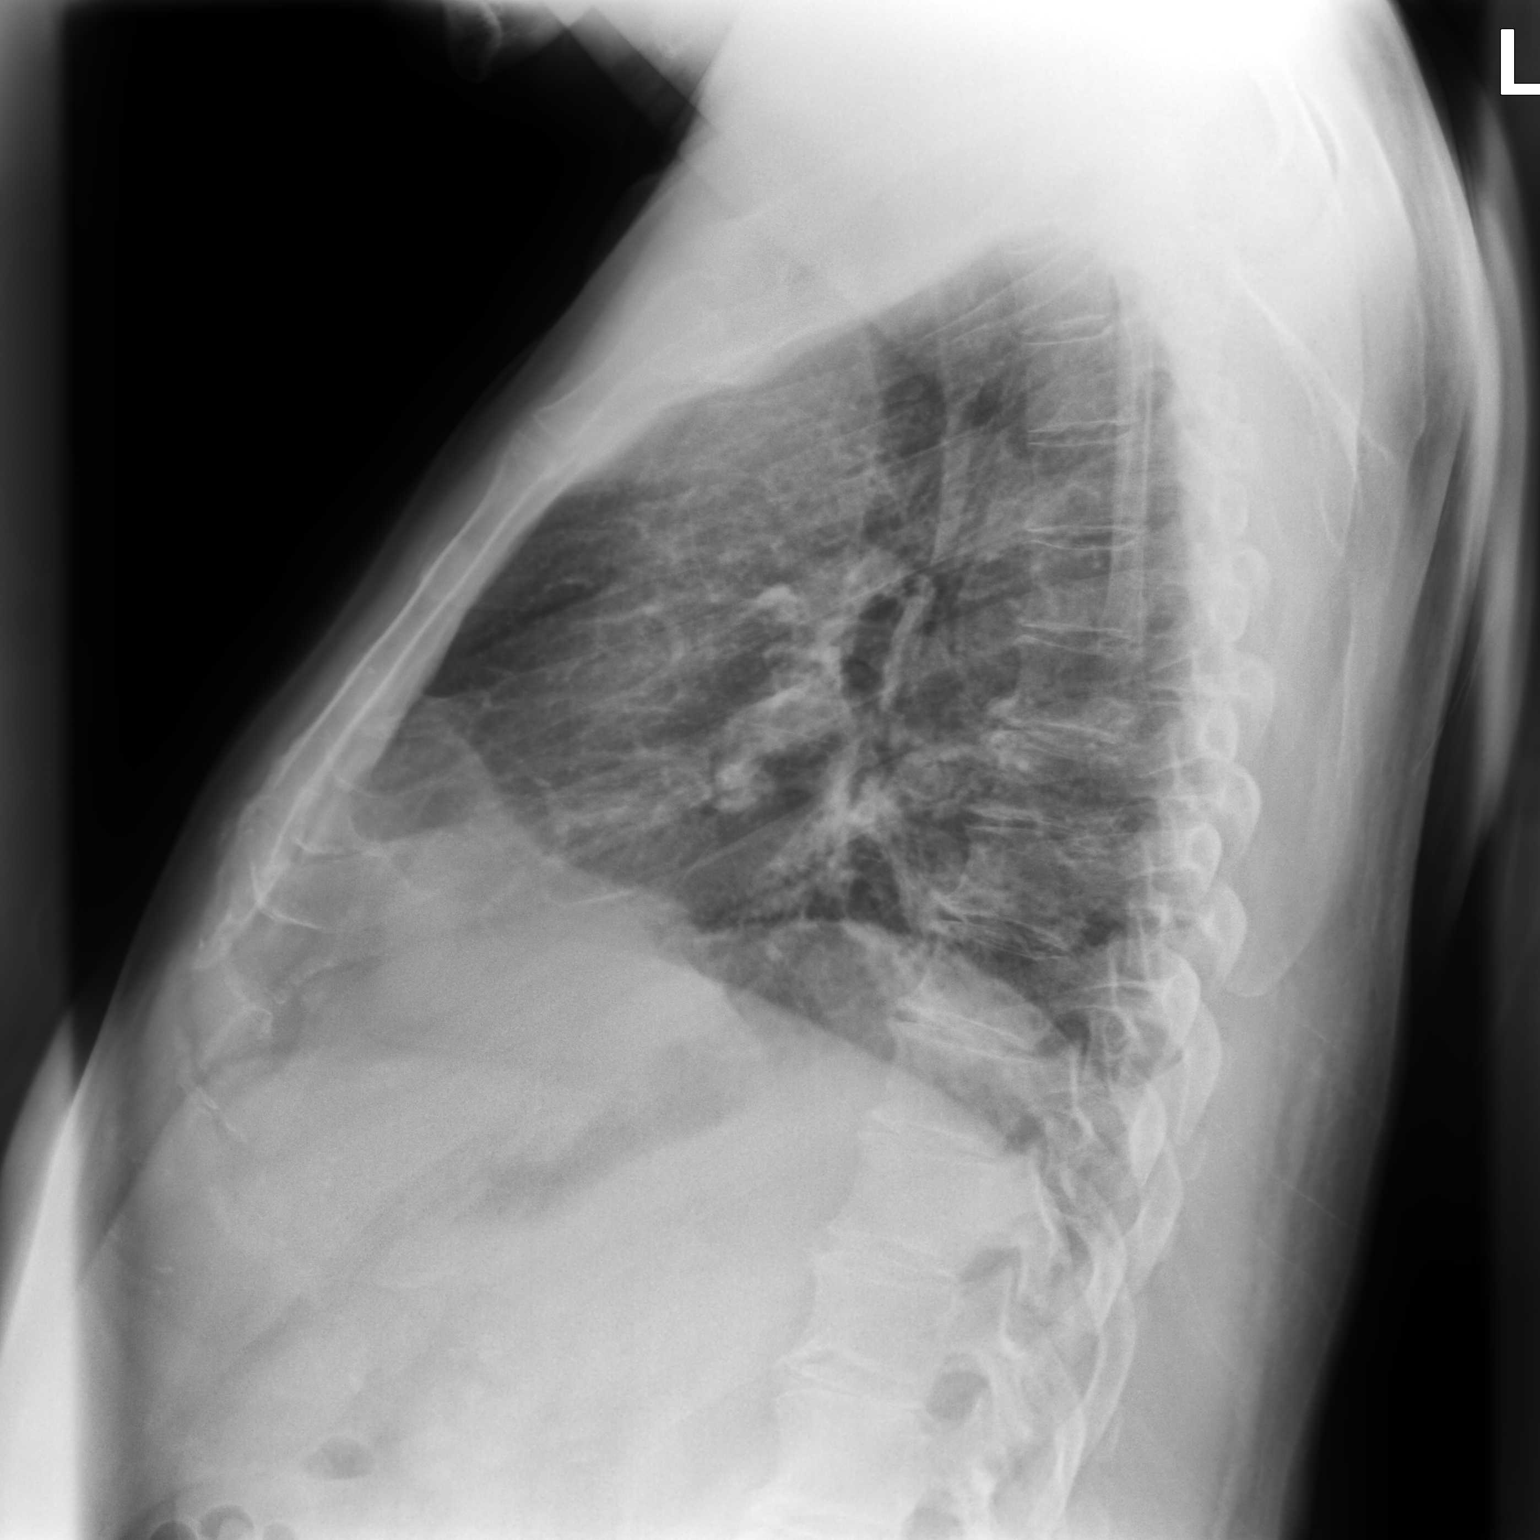

[2 of 2 positions shown; findings below may reference images not displayed]

FINDINGS: Mild scarring along the left hemidiaphragm. Lungs appear otherwise
clear. Cardiac and mediastinal margins appear normal. No blunting of
the costophrenic angles.

Mid to lower thoracic spondylosis.
IMPRESSION: 1. Chronic scarring at the left lung base. The lungs appear
otherwise clear.
2. Mild mid to lower thoracic spondylosis.

## 2021-12-10 MED ORDER — PREDNISONE 20 MG PO TABS: 20.0000 mg | ORAL_TABLET | Freq: Two times a day (BID) | ORAL | 0 refills | Status: AC

## 2021-12-10 MED ORDER — PAROXETINE HCL 40 MG PO TABS: 40.0000 mg | ORAL_TABLET | ORAL | 1 refills | Status: DC

## 2021-12-10 MED ORDER — TRAMADOL HCL 50 MG PO TABS: ORAL_TABLET | ORAL | 0 refills | Status: DC

## 2021-12-10 MED ORDER — ALBUTEROL SULFATE HFA 108 (90 BASE) MCG/ACT IN AERS: 1.0000 | INHALATION_SPRAY | Freq: Four times a day (QID) | RESPIRATORY_TRACT | 2 refills | Status: DC | PRN
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
## 2021-12-18 ENCOUNTER — Emergency Department (HOSPITAL_COMMUNITY)
Admission: EM | Admit: 2021-12-18 | Discharge: 2021-12-18 | Disposition: A | Discharge status: Home / Self Care | Payer: 59 | Attending: Emergency Medicine | Admitting: Emergency Medicine

## 2021-12-18 ENCOUNTER — Encounter (HOSPITAL_COMMUNITY): Payer: Self-pay

## 2021-12-18 ENCOUNTER — Emergency Department (HOSPITAL_COMMUNITY): Payer: 59

## 2021-12-18 DIAGNOSIS — M542 Cervicalgia: Secondary | ICD-10-CM | POA: Insufficient documentation

## 2021-12-18 DIAGNOSIS — F17210 Nicotine dependence, cigarettes, uncomplicated: Secondary | ICD-10-CM | POA: Diagnosis not present

## 2021-12-18 DIAGNOSIS — R000 Tachycardia, unspecified: Secondary | ICD-10-CM | POA: Diagnosis not present

## 2021-12-18 DIAGNOSIS — R079 Chest pain, unspecified: Secondary | ICD-10-CM | POA: Diagnosis not present

## 2021-12-18 DIAGNOSIS — R112 Nausea with vomiting, unspecified: Secondary | ICD-10-CM | POA: Insufficient documentation

## 2021-12-18 DIAGNOSIS — M25511 Pain in right shoulder: Secondary | ICD-10-CM | POA: Diagnosis not present

## 2021-12-18 DIAGNOSIS — M7918 Myalgia, other site: Secondary | ICD-10-CM | POA: Insufficient documentation

## 2021-12-18 DIAGNOSIS — M47814 Spondylosis without myelopathy or radiculopathy, thoracic region: Secondary | ICD-10-CM | POA: Diagnosis not present

## 2021-12-18 DIAGNOSIS — R0789 Other chest pain: Secondary | ICD-10-CM | POA: Diagnosis not present

## 2021-12-18 DIAGNOSIS — J45909 Unspecified asthma, uncomplicated: Secondary | ICD-10-CM | POA: Diagnosis not present

## 2021-12-18 DIAGNOSIS — M546 Pain in thoracic spine: Secondary | ICD-10-CM | POA: Diagnosis not present

## 2021-12-18 DIAGNOSIS — J9811 Atelectasis: Secondary | ICD-10-CM | POA: Diagnosis not present

## 2021-12-18 LAB — BASIC METABOLIC PANEL
Anion gap: 10 (ref 5–15)
BUN: 15 mg/dL (ref 6–20)
CO2: 25 mmol/L (ref 22–32)
Calcium: 9.8 mg/dL (ref 8.9–10.3)
Chloride: 102 mmol/L (ref 98–111)
Creatinine, Ser: 1.03 mg/dL (ref 0.61–1.24)
GFR, Estimated: >60 mL/min (ref >60)
Glucose, Bld: 179 mg/dL — ABNORMAL HIGH (ref 70–99)
Potassium: 3.9 mmol/L (ref 3.5–5.1)
Sodium: 137 mmol/L (ref 135–145)

## 2021-12-18 LAB — CBC
HCT: 46.4 % (ref 39.0–52.0)
Hemoglobin: 16.4 g/dL (ref 13.0–17.0)
MCH: 28.8 pg (ref 26.0–34.0)
MCHC: 35.3 g/dL (ref 30.0–36.0)
MCV: 81.5 fL (ref 80.0–100.0)
Platelets: 146 10*3/uL — ABNORMAL LOW (ref 150–400)
RBC: 5.69 MIL/uL (ref 4.22–5.81)
RDW: 13.9 % (ref 11.5–15.5)
WBC: 11.3 10*3/uL — ABNORMAL HIGH (ref 4.0–10.5)
nRBC: 0 % (ref 0.0–0.2)

## 2021-12-18 LAB — TROPONIN I (HIGH SENSITIVITY)
Troponin I (High Sensitivity): 5 ng/L (ref <18)
Troponin I (High Sensitivity): 6 ng/L (ref <18)
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
## 2021-12-18 MED ORDER — DIPHENHYDRAMINE HCL 50 MG/ML IJ SOLN
50.0000 mg | Freq: Once | INTRAMUSCULAR | Status: AC
  Administered 2021-12-18: 05:00:00 50 mg via INTRAVENOUS
  Filled 2021-12-18: qty 1

## 2021-12-18 MED ORDER — PREDNISONE 20 MG PO TABS: 40.0000 mg | ORAL_TABLET | Freq: Every day | ORAL | 0 refills | Status: AC

## 2021-12-18 MED ORDER — SODIUM CHLORIDE (PF) 0.9 % IJ SOLN
INTRAMUSCULAR | Status: AC
  Filled 2021-12-18: qty 50

## 2021-12-18 MED ORDER — HYDROMORPHONE HCL 1 MG/ML IJ SOLN
1.0000 mg | Freq: Once | INTRAMUSCULAR | Status: AC
  Administered 2021-12-18: 05:00:00 1 mg via INTRAVENOUS
  Filled 2021-12-18: qty 1

## 2021-12-18 MED ORDER — OXYCODONE HCL 5 MG PO TABS: 5.0000 mg | ORAL_TABLET | ORAL | 0 refills | Status: DC | PRN

## 2021-12-18 MED ORDER — ONDANSETRON HCL 4 MG/2ML IJ SOLN
4.0000 mg | Freq: Once | INTRAMUSCULAR | Status: AC
  Administered 2021-12-18: 05:00:00 4 mg via INTRAVENOUS
  Filled 2021-12-18: qty 2

## 2021-12-18 MED ORDER — METHYLPREDNISOLONE SODIUM SUCC 125 MG IJ SOLR
125.0000 mg | Freq: Once | INTRAMUSCULAR | Status: AC
  Administered 2021-12-18: 07:00:00 125 mg via INTRAVENOUS
  Filled 2021-12-18: qty 2

## 2021-12-18 MED ORDER — IOHEXOL 350 MG/ML SOLN
100.0000 mL | Freq: Once | INTRAVENOUS | Status: AC | PRN
  Administered 2021-12-18: 06:00:00 100 mL via INTRAVENOUS
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
## 2021-12-18 NOTE — ED Notes (Signed)
After administration of dilaudid, pt began experiencing some chest tightness and itching. Monitored for worsening of symptoms and informed provider. Received orders for 50mg  benadryl and administered. Pt maintained airway and O2 saturation throughout. Discomfort slowly relieved.
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
## 2021-12-18 NOTE — Discharge Instructions (Addendum)
You were evaluated in the Emergency Department and after careful evaluation, we did not find any emergent condition requiring admission or further testing in the hospital.  Your exam/testing today was overall reassuring.  Symptoms may be due to a pinched nerve in the neck.  Take the prednisone medication as directed.  Recommend Tylenol 1000 mg every 4-6 hours every 4-6 hours for pain.  You can use the oxycodone medication for more significant pain.  Please return to the Emergency Department if you experience any worsening of your condition.  Thank you for allowing [place] to be a part of your care.
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
## 2021-12-19 ENCOUNTER — Telehealth: Payer: Self-pay | Admitting: Family Medicine
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
## 2021-12-30 ENCOUNTER — Other Ambulatory Visit (HOSPITAL_COMMUNITY): Payer: Self-pay

## 2021-12-30 DIAGNOSIS — Z6829 Body mass index (BMI) 29.0-29.9, adult: Secondary | ICD-10-CM | POA: Diagnosis not present

## 2021-12-30 DIAGNOSIS — R030 Elevated blood-pressure reading, without diagnosis of hypertension: Secondary | ICD-10-CM | POA: Diagnosis not present

## 2021-12-30 DIAGNOSIS — M4802 Spinal stenosis, cervical region: Secondary | ICD-10-CM | POA: Diagnosis not present

## 2021-12-30 MED ORDER — METHOCARBAMOL 750 MG PO TABS
ORAL_TABLET | ORAL | 2 refills | Status: DC
  Filled 2021-12-30 – 2022-01-30 (×2): qty 90, 30d supply, fill #0

## 2022-01-02 ENCOUNTER — Other Ambulatory Visit (HOSPITAL_COMMUNITY): Payer: Self-pay

## 2022-01-02 MED ORDER — GABAPENTIN 300 MG PO CAPS
ORAL_CAPSULE | ORAL | 2 refills | Status: DC
  Filled 2022-01-02: qty 90, 90d supply, fill #0

## 2022-01-20 ENCOUNTER — Other Ambulatory Visit (HOSPITAL_COMMUNITY): Payer: Self-pay

## 2022-01-20 MED ORDER — HYDROCODONE-ACETAMINOPHEN 5-325 MG PO TABS
ORAL_TABLET | ORAL | 0 refills | Status: DC
  Filled 2022-01-20: qty 30, 5d supply, fill #0

## 2022-01-21 ENCOUNTER — Other Ambulatory Visit (HOSPITAL_COMMUNITY): Payer: Self-pay

## 2022-01-22 DIAGNOSIS — H1031 Unspecified acute conjunctivitis, right eye: Secondary | ICD-10-CM | POA: Diagnosis not present

## 2022-01-24 ENCOUNTER — Other Ambulatory Visit (HOSPITAL_BASED_OUTPATIENT_CLINIC_OR_DEPARTMENT_OTHER): Payer: Self-pay

## 2022-01-30 ENCOUNTER — Other Ambulatory Visit (HOSPITAL_COMMUNITY): Payer: Self-pay

## 2022-02-16 ENCOUNTER — Encounter (HOSPITAL_BASED_OUTPATIENT_CLINIC_OR_DEPARTMENT_OTHER): Payer: Self-pay

## 2022-02-16 ENCOUNTER — Emergency Department (HOSPITAL_BASED_OUTPATIENT_CLINIC_OR_DEPARTMENT_OTHER): Payer: 59

## 2022-02-16 ENCOUNTER — Other Ambulatory Visit: Payer: Self-pay

## 2022-02-16 ENCOUNTER — Emergency Department (HOSPITAL_BASED_OUTPATIENT_CLINIC_OR_DEPARTMENT_OTHER)
Admission: EM | Admit: 2022-02-16 | Discharge: 2022-02-16 | Disposition: A | Discharge status: Home / Self Care | Payer: 59 | Attending: Emergency Medicine | Admitting: Emergency Medicine

## 2022-02-16 DIAGNOSIS — F17200 Nicotine dependence, unspecified, uncomplicated: Secondary | ICD-10-CM | POA: Insufficient documentation

## 2022-02-16 DIAGNOSIS — M47816 Spondylosis without myelopathy or radiculopathy, lumbar region: Secondary | ICD-10-CM | POA: Diagnosis not present

## 2022-02-16 DIAGNOSIS — J45909 Unspecified asthma, uncomplicated: Secondary | ICD-10-CM | POA: Insufficient documentation

## 2022-02-16 DIAGNOSIS — X500XXA Overexertion from strenuous movement or load, initial encounter: Secondary | ICD-10-CM | POA: Diagnosis not present

## 2022-02-16 DIAGNOSIS — Y990 Civilian activity done for income or pay: Secondary | ICD-10-CM | POA: Diagnosis not present

## 2022-02-16 DIAGNOSIS — Y92239 Unspecified place in hospital as the place of occurrence of the external cause: Secondary | ICD-10-CM | POA: Diagnosis not present

## 2022-02-16 DIAGNOSIS — M545 Low back pain, unspecified: Secondary | ICD-10-CM | POA: Diagnosis not present

## 2022-02-16 DIAGNOSIS — M546 Pain in thoracic spine: Secondary | ICD-10-CM | POA: Diagnosis not present

## 2022-02-16 DIAGNOSIS — M4186 Other forms of scoliosis, lumbar region: Secondary | ICD-10-CM | POA: Diagnosis not present

## 2022-02-16 DIAGNOSIS — R0789 Other chest pain: Secondary | ICD-10-CM | POA: Diagnosis not present

## 2022-02-16 LAB — URINALYSIS, ROUTINE W REFLEX MICROSCOPIC
Bilirubin Urine: NEGATIVE
Glucose, UA: NEGATIVE mg/dL
Hgb urine dipstick: NEGATIVE
Ketones, ur: NEGATIVE mg/dL
Leukocytes,Ua: NEGATIVE
Nitrite: NEGATIVE
Protein, ur: NEGATIVE mg/dL
Specific Gravity, Urine: 1.025 (ref 1.005–1.030)
pH: 5.5 (ref 5.0–8.0)

## 2022-02-16 IMAGING — CR DG LUMBAR SPINE COMPLETE 4+V
5 series · 5 of 5 positions shown · non-contrast
Comparison: None.

CLINICAL DATA: Midline lumbar tenderness

EXAM:
LUMBAR SPINE - COMPLETE 4+ VIEW

[t l-spine a.p.]
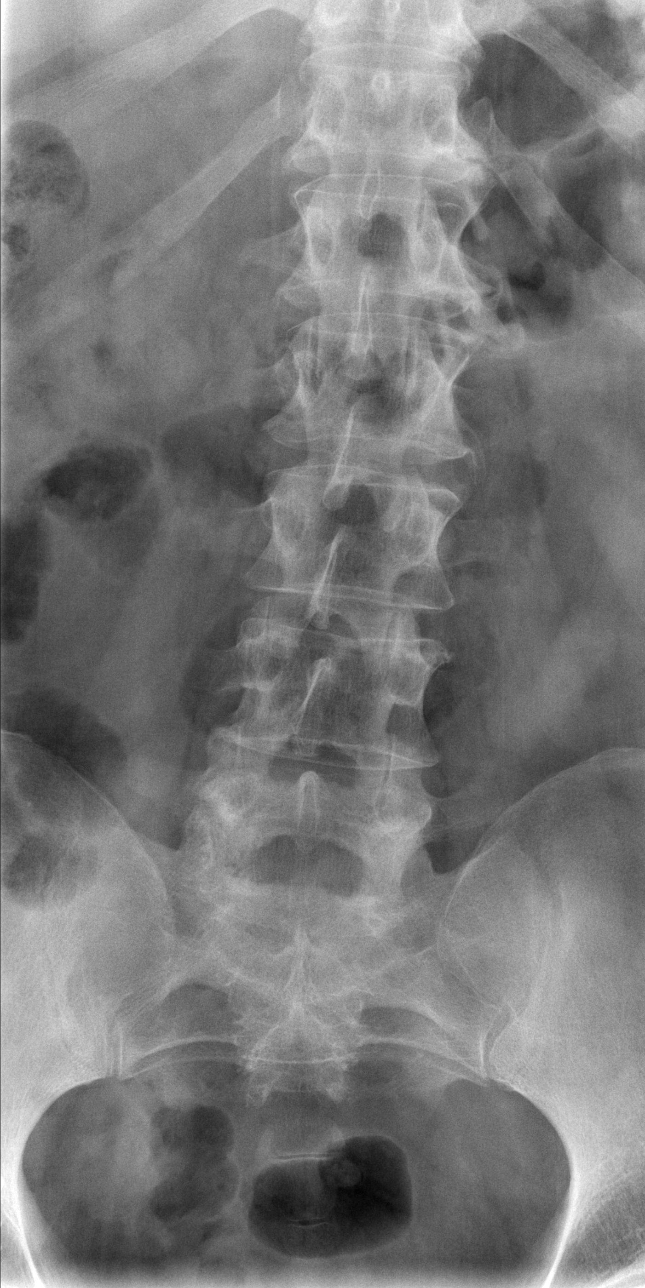

[t l-spine oblique exposure (1 of 2)]
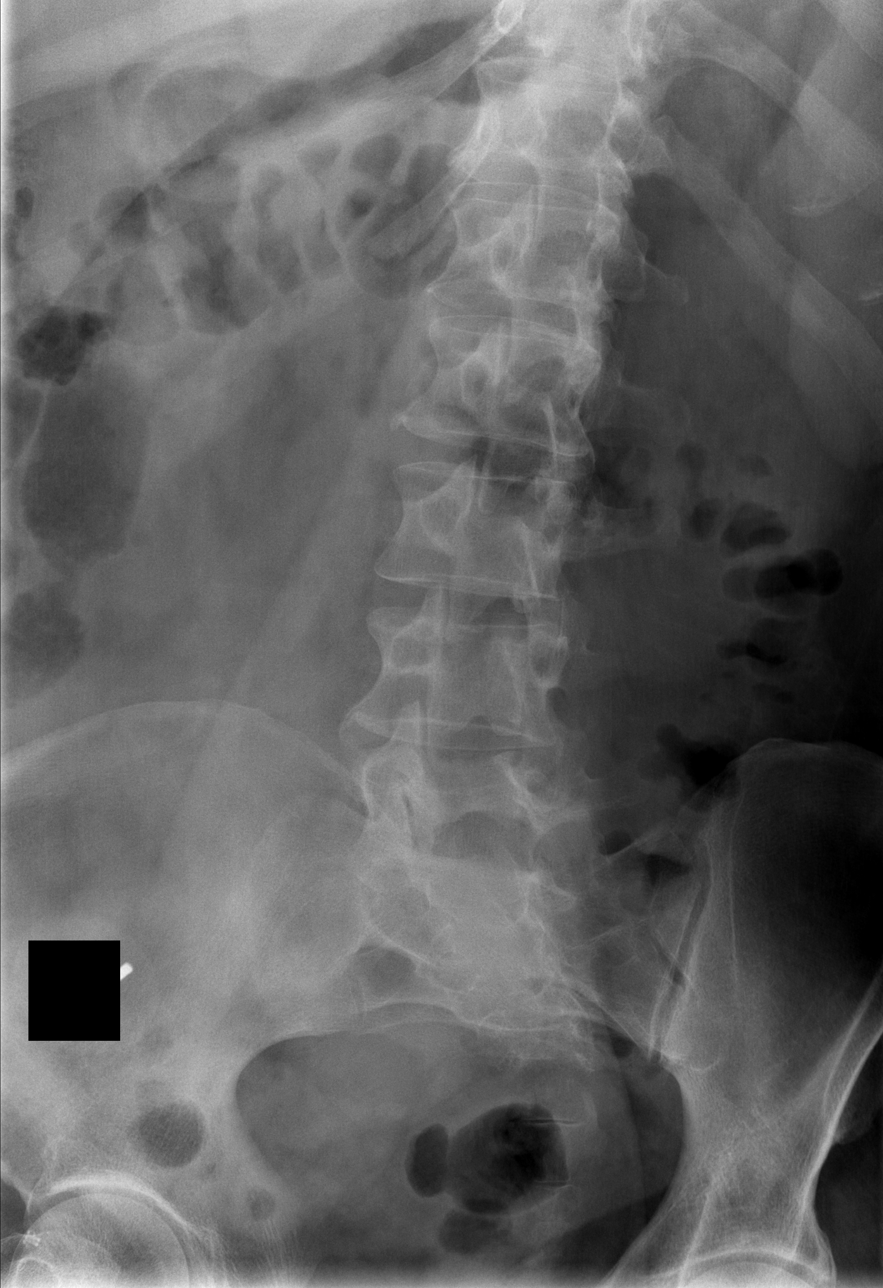

[t l-spine oblique exposure (2 of 2)]
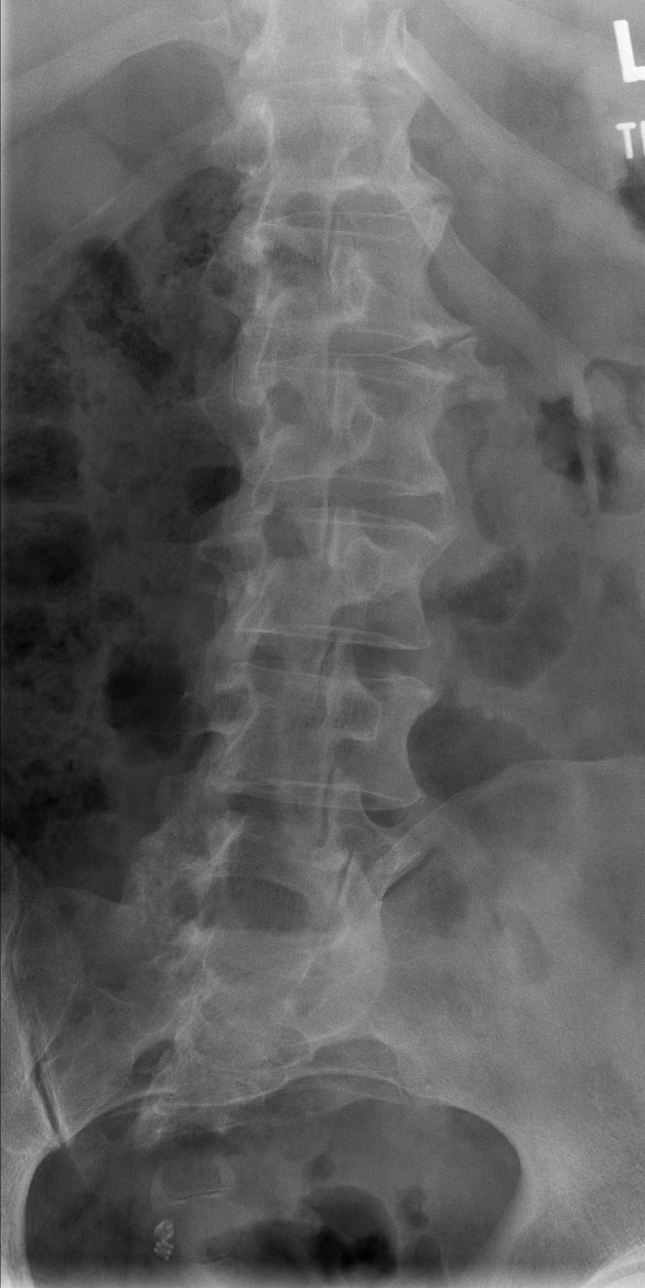

[t l-spine lat]
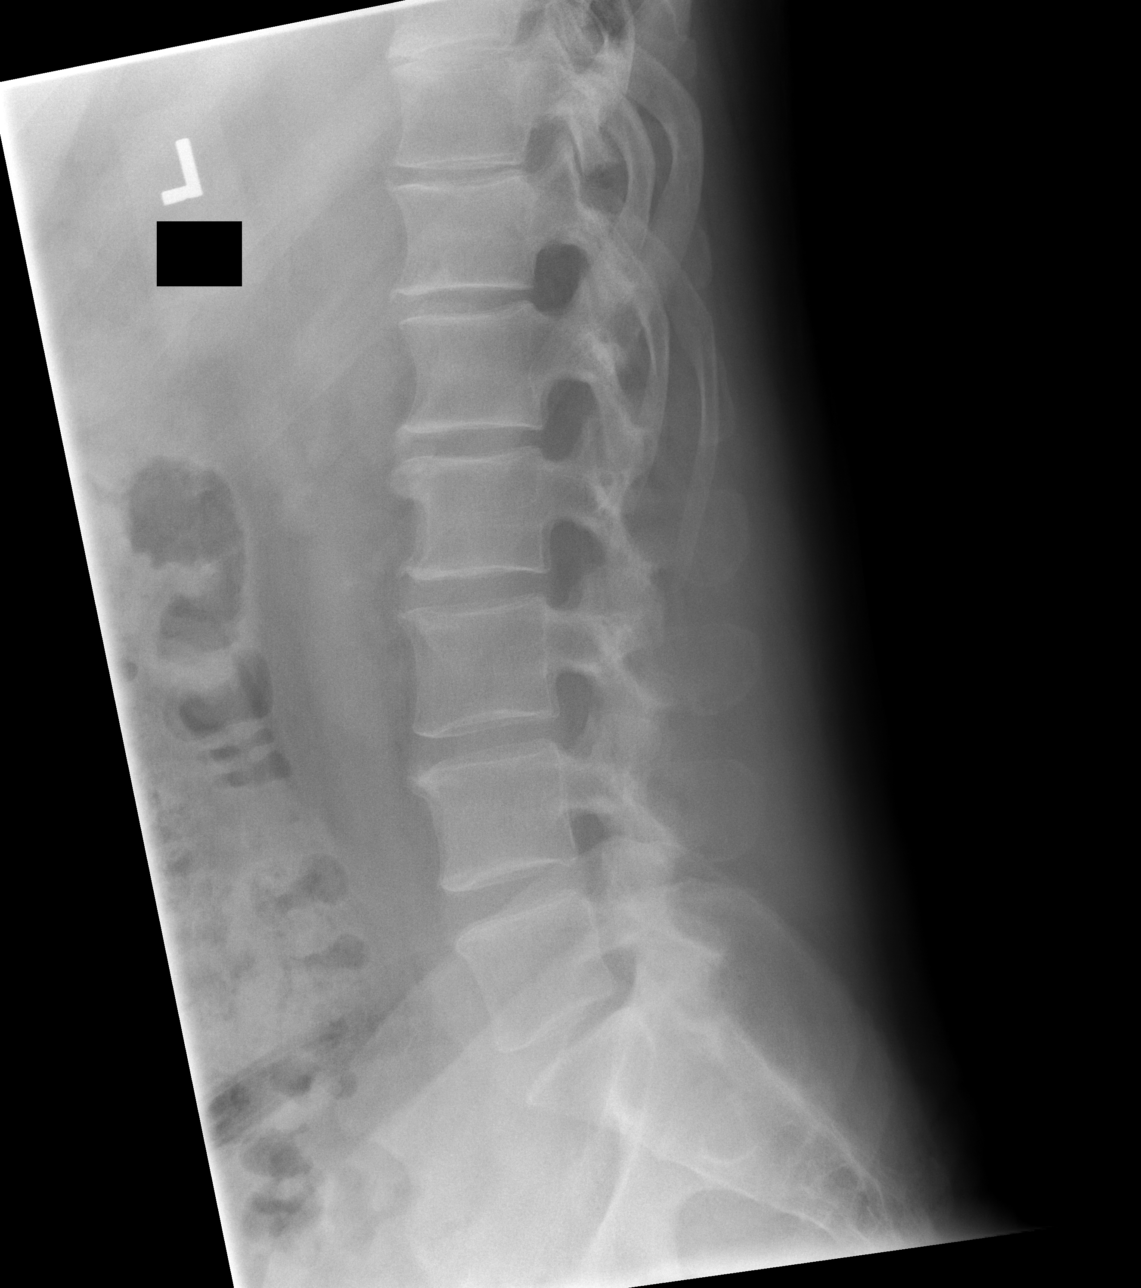

[t l-spine l5-s1 spot]
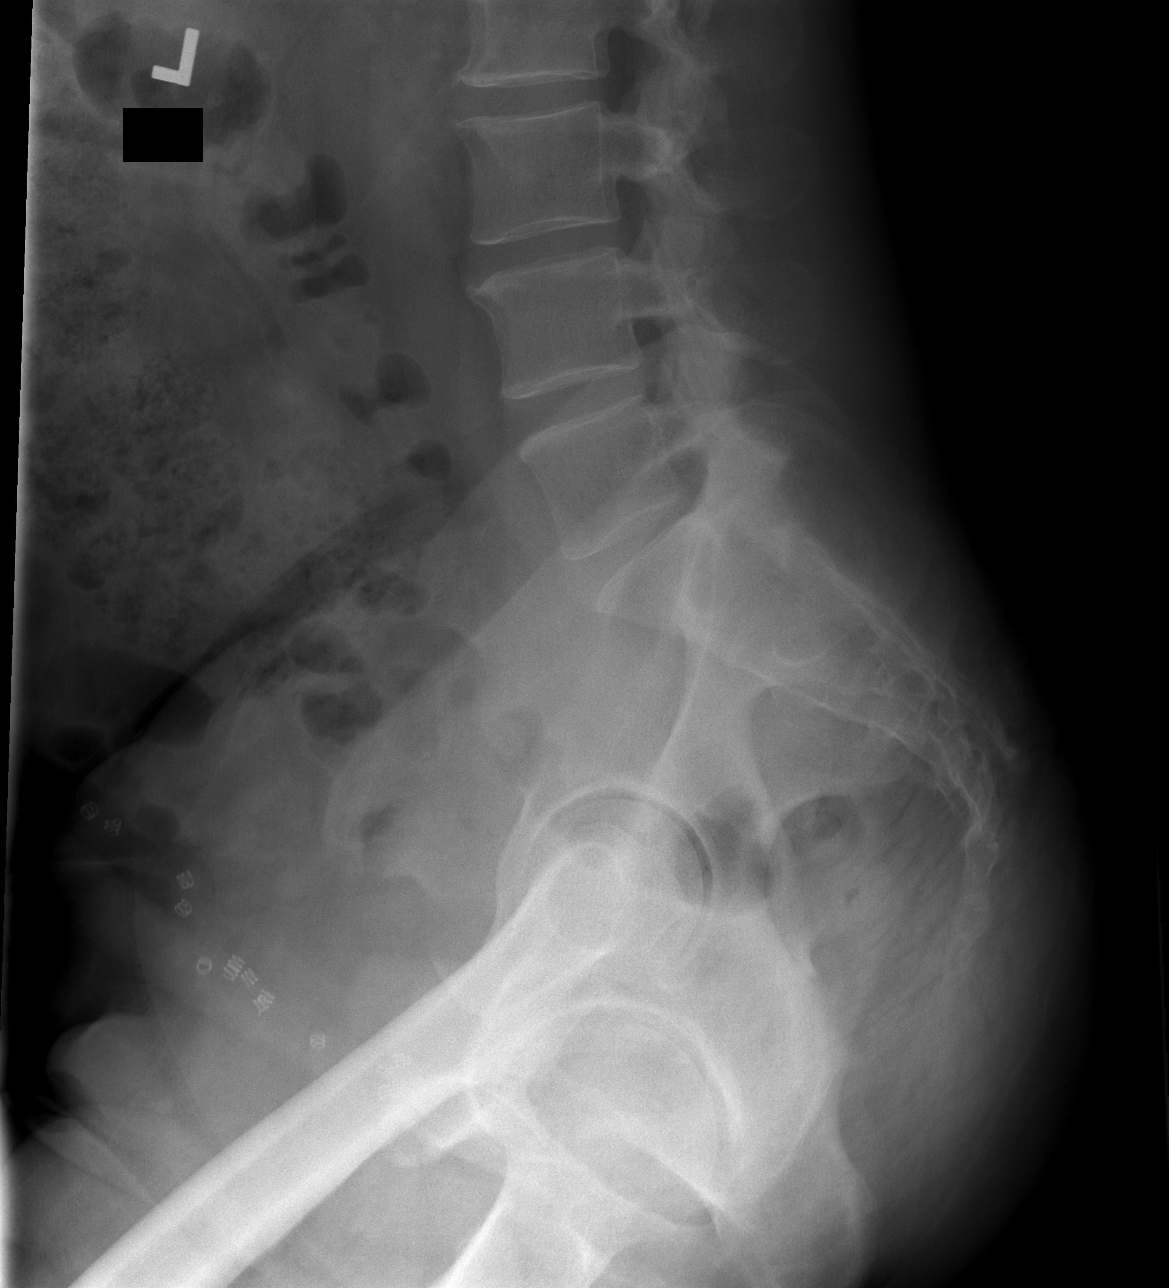

[5 of 5 positions shown; findings below may reference images not displayed]

FINDINGS: Mild levoscoliosis and degenerative endplate spurring. No evidence
of fracture, erosion, or bone lesion.
IMPRESSION: Mild spondylosis and levocurvature.  No acute or focal finding.

## 2022-02-16 MED ORDER — CYCLOBENZAPRINE HCL 10 MG PO TABS: 10.0000 mg | ORAL_TABLET | Freq: Two times a day (BID) | ORAL | 0 refills | Status: DC | PRN

## 2022-02-16 MED ORDER — OXYCODONE HCL 5 MG PO TABS
5.0000 mg | ORAL_TABLET | Freq: Once | ORAL | Status: AC
  Administered 2022-02-16: 5 mg via ORAL
  Filled 2022-02-16: qty 1

## 2022-02-16 MED ORDER — LIDOCAINE 5 % EX PTCH
1.0000 | MEDICATED_PATCH | CUTANEOUS | Status: DC
  Administered 2022-02-16: 1 via TRANSDERMAL
  Filled 2022-02-16: qty 1

## 2022-02-16 MED ORDER — LIDOCAINE 5 % EX PTCH: 1.0000 | MEDICATED_PATCH | CUTANEOUS | 0 refills | Status: DC

## 2022-02-16 NOTE — ED Triage Notes (Signed)
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 Left lower back pain, worsening since last night, unable to stand to get out of car. Nausea/vomiting this morning.  ?

## 2022-02-16 NOTE — Discharge Instructions (Addendum)
You can take naproxen twice daily as needed for back pain.  Lidocaine patches twice daily as needed for back pain.  Please use your methocarbamol for muscle spasm  Please follow-up with your primary care provider.  I will write you out for light duty at work.  Return to the emergency department for any worsening symptoms. ?

## 2022-02-16 NOTE — ED Provider Notes (Signed)
?[facility] EM[facility] DEPART[facility] ?Provider Note ? ? ?CSN: [id number] ?Arrival date & time: 02/16/22  1011 ? ?  ? ?History ?Chief Complaint  ?Patient presents with  ? Back Pain  ? ? ?Alan Sanders is a 51 y.o. male who presents to the emergency department today with left lower back pain started last night while lifting a patient.  Patient states she works at [employer] and was attempting to get somewhat out of a vehicle and after helping he felt pain.  This has been constant since onset.  He denies any urinary complaints.  No focal weakness or numbness.  No bowel or bladder incontinence. ? ? ?Back Pain ? ?  ? ?Home Medications ?Prior to Admission medications   ?Medication Sig Start Date End Date Taking? Authorizing Provider  ?cyclobenzaprine (FLEXERIL) 10 MG tablet Take 1 tablet (10 mg total) by mouth 2 (two) times daily as needed for muscle spasms. 02/16/22  Yes Brentt Fread M, PA-C  ?lidocaine (LIDODERM) 5 % Place 1 patch onto the skin daily. Remove & Discard patch within 12 hours or as directed by MD 02/16/22  Yes Raul Del, Niquita Digioia M, PA-C  ?albuterol (VENTOLIN HFA) 108 (90 Base) MCG/ACT inhaler Inhale 1-2 puffs into the lungs every 6 (six) hours as needed for wheezing or shortness of breath. 12/10/21   Libby Maw, MD  ?fluticasone (FLONASE) 50 MCG/ACT nasal spray PLACE 2 SPRAYS INTO BOTH NOSTRILS DAILY. ?Patient taking differently: Place 2 sprays into both nostrils daily as needed for allergies. 02/18/21 02/18/22  Libby Maw, MD  ?gabapentin (NEURONTIN) 300 MG capsule Take 1 capsule (300 mg total) by mouth 3 (three) times daily. ?Patient taking differently: Take 60-900 mg by mouth at bedtime. 11/20/21     ?gabapentin (NEURONTIN) 300 MG capsule Take 1 capsule by mouth every evening 01/02/22   Marvis Moeller, NP  ?HYDROcodone-acetaminophen (NORCO/VICODIN) 5-325 MG tablet Take 1 tablet by mouth every 4 hours as needed for pain. 01/20/22     ?methocarbamol (ROBAXIN) 750 MG  tablet Take 1 tablet by mouth 3 times every day as needed for spasms 11/20/21     ?methocarbamol (ROBAXIN) 750 MG tablet Take 1 tablet by mouth 3 times daily as needed for muscle spasms. 12/30/21     ?oxyCODONE (ROXICODONE) 5 MG immediate release tablet Take 1 tablet (5 mg total) by mouth every 4 (four) hours as needed for severe pain. 12/18/21   Maudie Flakes, MD  ?PARoxetine (PAXIL) 40 MG tablet Take 1 tablet (40 mg total) by mouth every morning. 12/10/21   Libby Maw, MD  ?traMADol Veatrice Bourbon) 50 MG tablet May take one at night as needed for pain. 12/10/21   Libby Maw, MD  ?   ? ?Allergies    ?Hydromorphone   ? ?Review of Systems   ?Review of Systems  ?Musculoskeletal:  Positive for back pain.  ?All other systems reviewed and are negative. ? ?Physical Exam ?Updated Vital Signs ?BP (!) 153/90 (BP Location: Right Arm)   Pulse 96   Temp 97.8 ?F (36.6 ?C) (Oral)   Resp 18   Ht '6\' 2"'$  (1.88 m)   Wt 106.6 kg   SpO2 99%   BMI 30.17 kg/m?  ?Physical Exam ?Vitals and nursing note reviewed.  ?Constitutional:   ?   Appearance: Normal appearance.  ?HENT:  ?   Head: Normocephalic and atraumatic.  ?Eyes:  ?   General:     ?   Right eye: No discharge.     ?  Left eye: No discharge.  ?   Conjunctiva/sclera: Conjunctivae normal.  ?Pulmonary:  ?   Effort: Pulmonary effort is normal.  ?Musculoskeletal:  ?   Comments: There is point tenderness just laterally to the left of the lumbar spine.  Lumbar spine is mildly tender to palpation.  5/5 strength to the lower extremities.  Normal sensation to the lower extremities. No bony step offs.   ?Skin: ?   General: Skin is warm and dry.  ?   Findings: No rash.  ?Neurological:  ?   General: No focal deficit present.  ?   Mental Status: He is alert.  ?Psychiatric:     ?   Mood and Affect: Mood normal.     ?   Behavior: Behavior normal.  ? ? ?ED Results / Procedures / Treatments   ?Labs ?(all labs ordered are listed, but only abnormal results are displayed) ?Labs  Reviewed  ?URINALYSIS, ROUTINE W REFLEX MICROSCOPIC  ? ? ?EKG ?None ? ?Radiology ?DG Lumbar Spine Complete ? ?Result Date: 02/16/2022 ?CLINICAL DATA:  Midline lumbar tenderness EXAM: LUMBAR SPINE - COMPLETE 4+ VIEW COMPARISON:  None. FINDINGS: Mild levoscoliosis and degenerative endplate spurring. No evidence of fracture, erosion, or bone lesion. IMPRESSION: Mild spondylosis and levocurvature.  No acute or focal finding. Electronically Signed   By: Jorje Guild M.D.   On: 02/16/2022 12:16   ? ?Procedures ?Procedures  ? ? ?Medications Ordered in ED ?Medications  ?lidocaine (LIDODERM) 5 % 1 patch (1 patch Transdermal Patch Applied 02/16/22 1130)  ?oxyCODONE (Oxy IR/ROXICODONE) immediate release tablet 5 mg (5 mg Oral Given 02/16/22 1130)  ? ? ?ED Course/ Medical Decision Making/ A&P ?  ?                        ?Medical Decision Making ?Amount and/or Complexity of Data Reviewed ?Labs: ordered. ?Radiology: ordered. ? ?Risk ?Prescription drug management. ? ? ?This patient presents to the ED for concern of back pain, this involves an extensive number of treatment options, and is a complaint that carries with it a high risk of complications and morbidity.  The differential diagnosis includes benign back pain.  Patient does not have any significant red flags.  Given the history this is likely musculoskeletal strain.  I have a low suspicion for cauda equina, epidural abscess, fractures or dislocations. No bony step offs on my exam.  ? ? ?Co morbidities that complicate the patient evaluation ? ?Past Medical History:  ?Diagnosis Date  ? Anxiety   ? Arthritis   ? " in my back "  ? Asthma   ? Balance problem   ? Gait difficulty   ? GERD (gastroesophageal reflux disease)   ? Headache   ? migraines  ? Inguinal hernia of left side without obstruction or gangrene   ? Pneumonia   ? Seizures ([place])   ? " its been along time ,since my last seizure "  ? Spleen enlarged   ? Thrombocytopenia ([place])   ? Tobacco abuse   ? ? ?Additional  history obtained: ? ?Additional history obtained from nursing note ?External records from outside source obtained and reviewed including DG lumbar spine from 2021 which was normal. ? ? ?Lab Tests: ? ?I Ordered, and personally interpreted labs.  The pertinent results include:  UA which did not show any signs of infection or hematuria. I doubt kidney stone.  ? ? ?Imaging Studies ordered: ? ?I ordered imaging studies including imaging of the lumbar spine.  ?  I independently visualized and interpreted imaging which showed no acute fracture  ?I agree with the radiologist interpretation ? ? ?Cardiac Monitoring: ? ?The patient was maintained on a cardiac monitor.  I personally viewed and interpreted the cardiac monitored which showed an underlying rhythm of: normal sinus rhythm  ? ? ?Medicines ordered and prescription drug management: ? ?I ordered medication including oxycodone for pain for pain ?Reevaluation of the patient after these medicines showed that the patient improved ?I have reviewed the patients home medicines and have made adjustments as needed ? ? ?Problem List / ED Course: ? ?Back pain likely musculoskeletal strain.  No back pain red flags on my physical exam or history.  Seems to be mechanical from when he was lifting a patient yesterday.  Patient feeling better with oxycodone and lidocaine patches.  We will write him to light duty for work next week.  Imaging did not reveal any fractures or dislocations.  Patient has no neurological deficits in the low suspicion for cauda equina at this time.  No clinical indication to admit the patient today.  We will have him follow-up in the outpatient setting.  He is safe for discharge. ? ? ?Reevaluation: ? ?After the interventions noted above, I reevaluated the patient and found that they have :improved ? ? ?Social Determinants of Health: ? ?Social Determinants of Health with Concerns  ? ?Tobacco Use: High Risk  ? Smoking Tobacco Use: Every Day  ? Smokeless Tobacco  Use: Current  ? Passive Exposure: Not on file  ?Financial Resource Strain: Not on file  ?Food Insecurity: Not on file  ?Transportation Needs: Not on file  ?Physical Activity: Not on file  ?Stress: Not on file  ?Social [employer]

## 2022-02-27 ENCOUNTER — Encounter: Payer: Self-pay | Admitting: Family Medicine

## 2022-02-27 ENCOUNTER — Ambulatory Visit: Payer: 59 | Admitting: Family Medicine

## 2022-02-27 VITALS — BP 134/78 | HR 96 | Temp 97.3°F | Ht 74.0 in | Wt 228.4 lb

## 2022-02-27 DIAGNOSIS — F418 Other specified anxiety disorders: Secondary | ICD-10-CM | POA: Diagnosis not present

## 2022-02-27 DIAGNOSIS — S39012A Strain of muscle, fascia and tendon of lower back, initial encounter: Secondary | ICD-10-CM | POA: Insufficient documentation

## 2022-02-27 DIAGNOSIS — F5105 Insomnia due to other mental disorder: Secondary | ICD-10-CM

## 2022-02-27 DIAGNOSIS — N486 Induration penis plastica: Secondary | ICD-10-CM | POA: Diagnosis not present

## 2022-02-27 DIAGNOSIS — M79671 Pain in right foot: Secondary | ICD-10-CM

## 2022-02-27 DIAGNOSIS — Z1211 Encounter for screening for malignant neoplasm of colon: Secondary | ICD-10-CM | POA: Insufficient documentation

## 2022-02-27 DIAGNOSIS — Z23 Encounter for immunization: Secondary | ICD-10-CM | POA: Diagnosis not present

## 2022-02-27 DIAGNOSIS — S39012D Strain of muscle, fascia and tendon of lower back, subsequent encounter: Secondary | ICD-10-CM | POA: Diagnosis not present

## 2022-02-27 DIAGNOSIS — M79672 Pain in left foot: Secondary | ICD-10-CM | POA: Diagnosis not present

## 2022-02-27 HISTORY — DX: Induration penis plastica: N48.6

## 2022-02-27 MED ORDER — METHOCARBAMOL 750 MG PO TABS: ORAL_TABLET | ORAL | 2 refills | Status: DC

## 2022-02-27 MED ORDER — TRAMADOL HCL 50 MG PO TABS: ORAL_TABLET | ORAL | 0 refills | Status: DC

## 2022-02-27 MED ORDER — QUETIAPINE FUMARATE 25 MG PO TABS: 25.0000 mg | ORAL_TABLET | Freq: Every day | ORAL | 2 refills | Status: DC

## 2022-02-27 MED ORDER — GABAPENTIN 300 MG PO CAPS
300.0000 mg | ORAL_CAPSULE | Freq: Three times a day (TID) | ORAL | 2 refills | Status: DC
  Filled 2022-03-13 – 2022-04-22 (×2): qty 90, 30d supply, fill #0
  Filled 2022-05-20: qty 90, 30d supply, fill #1
  Filled 2022-06-17: qty 90, 30d supply, fill #2

## 2022-02-27 NOTE — Progress Notes (Signed)
? ?Established Patient Office Visit ? ?Subjective:  ?Patient ID: Alan Sanders, male    DOB: [birth year]  Age: 51 y.o. MRN: [id number] ? ?CC:  ?Chief Complaint  ?Patient presents with  ? Follow-up  ?  Follow up on back, need note to be off light duty.   ? ? ?HPI ?Alan Sanders presents for follow-up of depression with anxiety and insomnia, dysesthesias in feet and lower back pain.  Seen in the emergency room last week and was prescribed lidocaine patches, Flexeril, and tramadol.  He is doing better and would like to return to full duty.  He is accompanied by his wife today.  Patient's mother died back in 02-04-2023.  He is not sure that the Paxil has been much help.  Reports emotional lability and his wife confirms.  He has been irritable.  History of painful curved erections.  He saw a urologist in [place] who prescribed medication that was $1500 monthly.  Patient cannot afford that. ? ?Past Medical History:  ?Diagnosis Date  ? Anxiety   ? Arthritis   ? " in my back "  ? Asthma   ? Balance problem   ? Gait difficulty   ? GERD (gastroesophageal reflux disease)   ? Headache   ? migraines  ? Inguinal hernia of left side without obstruction or gangrene   ? Pneumonia   ? Seizures ([place])   ? " its been along time ,since my last seizure "  ? Spleen enlarged   ? Thrombocytopenia ([place])   ? Tobacco abuse   ? ? ?Past Surgical History:  ?Procedure Laterality Date  ? ANTERIOR CERVICAL DECOMP/DISCECTOMY FUSION N/A 04/16/2021  ? Procedure: Cervical three-four  Anterior cervical decompression/discectomy/fusion;  Surgeon: Dawley, Theodoro Doing, DO;  Location: [facility];  Service: Neurosurgery;  Laterality: N/A;  ? HERNIA REPAIR    ? inguinal  ? SURAL NERVE BX Right 12/11/2020  ? Procedure: SURAL NERVE BIOPSY;  Surgeon: Karsten Ro, DO;  Location: [facility];  Service: Neurosurgery;  Laterality: Right;  ? ? ?Family History  ?Problem Relation Age of Onset  ? Diabetes Mother   ? Hypertension Mother   ? COPD Mother   ? Alcoholism Brother    ? ? ?Social History  ? ?Socioeconomic History  ? Marital status: Married  ?  Spouse name: Not on file  ? Number of children: 2  ? Years of education: Not on file  ? Highest education level: High school graduate  ?Occupational History  ? Not on file  ?Tobacco Use  ? Smoking status: Every Day  ?  Packs/day: 0.50  ?  Years: 24.00  ?  Pack years: 12.00  ?  Types: Cigarettes  ? Smokeless tobacco: Current  ?  Types: Snuff  ?Vaping Use  ? Vaping Use: Never used  ?Substance and Sexual Activity  ? Alcohol use: No  ? Drug use: No  ? Sexual activity: Not on file  ?Other Topics Concern  ? Not on file  ?Social History Narrative  ? Lives with spouse  ? Sweet tea, energy drinks, Diet Coke, work 12 hr shifts  ? ?Social Determinants of Health  ? ?Financial Resource Strain: Not on file  ?Food Insecurity: Not on file  ?Transportation Needs: Not on file  ?Physical Activity: Not on file  ?Stress: Not on file  ?Social Connections: Not on file  ?Intimate Partner Violence: Not on file  ? ? ?Outpatient Medications Prior to Visit  ?Medication Sig Dispense Refill  ? albuterol (VENTOLIN  HFA) 108 (90 Base) MCG/ACT inhaler Inhale 1-2 puffs into the lungs every 6 (six) hours as needed for wheezing or shortness of breath. 8 g 2  ? lidocaine (LIDODERM) 5 % Place 1 patch onto the skin daily. Remove & Discard patch within 12 hours or as directed by MD 30 patch 0  ? cyclobenzaprine (FLEXERIL) 10 MG tablet Take 1 tablet (10 mg total) by mouth 2 (two) times daily as needed for muscle spasms. 20 tablet 0  ? gabapentin (NEURONTIN) 300 MG capsule Take 1 capsule (300 mg total) by mouth 3 (three) times daily. (Patient taking differently: Take 60-900 mg by mouth at bedtime.) 90 capsule 2  ? HYDROcodone-acetaminophen (NORCO/VICODIN) 5-325 MG tablet Take 1 tablet by mouth every 4 hours as needed for pain. 30 tablet 0  ? methocarbamol (ROBAXIN) 750 MG tablet Take 1 tablet by mouth 3 times every day as needed for spasms 90 tablet 2  ? traMADol (ULTRAM) 50 MG  tablet May take one at night as needed for pain. 15 tablet 0  ? PARoxetine (PAXIL) 40 MG tablet Take 1 tablet (40 mg total) by mouth every morning. (Patient not taking: Reported on 02/27/2022) 90 tablet 1  ? fluticasone (FLONASE) 50 MCG/ACT nasal spray PLACE 2 SPRAYS INTO BOTH NOSTRILS DAILY. (Patient taking differently: Place 2 sprays into both nostrils daily as needed for allergies.) 16 g 6  ? gabapentin (NEURONTIN) 300 MG capsule Take 1 capsule by mouth every evening 90 capsule 2  ? methocarbamol (ROBAXIN) 750 MG tablet Take 1 tablet by mouth 3 times daily as needed for muscle spasms. 90 tablet 2  ? oxyCODONE (ROXICODONE) 5 MG immediate release tablet Take 1 tablet (5 mg total) by mouth every 4 (four) hours as needed for severe pain. (Patient not taking: Reported on 02/27/2022) 10 tablet 0  ? ?No facility-administered medications prior to visit.  ? ? ?Allergies  ?Allergen Reactions  ? Hydromorphone Itching and Rash  ? ? ?ROS ?Review of Systems  ?Constitutional:  Negative for chills, diaphoresis, fatigue, fever and unexpected weight change.  ?HENT: Negative.    ?Eyes:  Negative for photophobia and visual disturbance.  ?Respiratory: Negative.    ?Cardiovascular: Negative.   ?Gastrointestinal: Negative.   ?Endocrine: Negative for polyphagia and polyuria.  ?Genitourinary:  Positive for penile pain. Negative for difficulty urinating and dysuria.  ?Musculoskeletal:  Positive for back pain.  ?Neurological:  Negative for speech difficulty, weakness and light-headedness.  ? ?  ? ?  02/27/2022  ?  1:35 PM 12/10/2021  ?  5:01 PM 12/10/2021  ?  3:51 PM  ?Depression screen PHQ 2/9  ?Decreased Interest 0 3 0  ?Down, Depressed, Hopeless 1 3 0  ?PHQ - 2 Score 1 6 0  ?Altered sleeping  3   ?Tired, decreased energy  3   ?Change in appetite  3   ?Feeling bad or failure about yourself   2   ?Trouble concentrating  0   ?Moving slowly or fidgety/restless  0   ?Suicidal thoughts  0   ?PHQ-9 Score  17   ?Difficult doing work/chores  Somewhat  difficult   ? ? ? ?Objective:  ?  ?Physical Exam ?Vitals and nursing note reviewed.  ?Constitutional:   ?   Appearance: Normal appearance.  ?HENT:  ?   Head: Normocephalic and atraumatic.  ?   Right Ear: External ear normal.  ?   Left Ear: External ear normal.  ?Eyes:  ?   General: No scleral icterus.    ?  Right eye: No discharge.     ?   Left eye: No discharge.  ?   Extraocular Movements: Extraocular movements intact.  ?   Conjunctiva/sclera: Conjunctivae normal.  ?   Pupils: Pupils are equal, round, and reactive to light.  ?Cardiovascular:  ?   Rate and Rhythm: Normal rate and regular rhythm.  ?Pulmonary:  ?   Effort: Pulmonary effort is normal.  ?   Breath sounds: Normal breath sounds.  ?Abdominal:  ?   General: Bowel sounds are normal.  ?Musculoskeletal:  ?   Cervical back: No rigidity or tenderness.  ?   Lumbar back: No tenderness or bony tenderness. Normal range of motion. Negative right straight leg raise test and negative left straight leg raise test.  ?Lymphadenopathy:  ?   Cervical: No cervical adenopathy.  ?Skin: ?   General: Skin is warm and dry.  ?Neurological:  ?   Mental Status: He is alert and oriented to person, place, and time.  ?   Motor: No weakness.  ?Psychiatric:     ?   Mood and Affect: Mood normal.     ?   Behavior: Behavior normal.  ? ? ?BP 134/78 (BP Location: Right Arm, Patient Position: Sitting, Cuff Size: Large)   Pulse 96   Temp (!) 97.3 ?F (36.3 ?C) (Temporal)   Ht '6\' 2"'$  (1.88 m)   Wt 228 lb 6.4 oz (103.6 kg)   SpO2 96%   BMI 29.32 kg/m?  ?Wt Readings from Last 3 Encounters:  ?02/27/22 228 lb 6.4 oz (103.6 kg)  ?02/16/22 235 lb (106.6 kg)  ?12/10/21 234 lb 6.4 oz (106.3 kg)  ? ? ? ?Health Maintenance Due  ?Topic Date Due  ? COLONOSCOPY (Pts 45-42yr Insurance coverage will need to be confirmed)  Never done  ? Zoster Vaccines- Shingrix (1 of 2) Never done  ? ? ?There are no preventive care reminders to display for this patient. ? ?Lab Results  ?Component Value Date  ? TSH 2.51  04/13/2020  ? ?Lab Results  ?Component Value Date  ? WBC 11.3 (H) 12/18/2021  ? HGB 16.4 12/18/2021  ? HCT 46.4 12/18/2021  ? MCV 81.5 12/18/2021  ? PLT 146 (L) 12/18/2021  ? ?Lab Results  ?Component Value Date  ? NA 137 01

## 2022-03-05 ENCOUNTER — Encounter: Payer: Self-pay | Admitting: Gastroenterology

## 2022-03-13 ENCOUNTER — Other Ambulatory Visit (HOSPITAL_COMMUNITY): Payer: Self-pay

## 2022-03-14 ENCOUNTER — Other Ambulatory Visit (HOSPITAL_COMMUNITY): Payer: Self-pay

## 2022-03-17 DIAGNOSIS — R519 Headache, unspecified: Secondary | ICD-10-CM | POA: Diagnosis not present

## 2022-03-17 DIAGNOSIS — K591 Functional diarrhea: Secondary | ICD-10-CM | POA: Diagnosis not present

## 2022-03-18 ENCOUNTER — Telehealth: Payer: Self-pay | Admitting: *Deleted

## 2022-03-18 NOTE — Telephone Encounter (Signed)
Patient no showed PV today-AT 8 AM  Called patient and left message  AT 752 AM, 8 AM AND 810 AM - AT 810 AM to return call by 5 pm today- If no call by 5 pm, PV and procedure will be canceled -  Alan Sanders PV  ?

## 2022-03-18 NOTE — Telephone Encounter (Signed)
Pt RS PV for 4-26 WED at 3 pm  ?

## 2022-03-19 ENCOUNTER — Ambulatory Visit (AMBULATORY_SURGERY_CENTER): Payer: 59 | Admitting: *Deleted

## 2022-03-19 ENCOUNTER — Encounter: Payer: Self-pay | Admitting: Gastroenterology

## 2022-03-19 VITALS — Ht 74.0 in | Wt 222.0 lb

## 2022-03-19 DIAGNOSIS — Z1211 Encounter for screening for malignant neoplasm of colon: Secondary | ICD-10-CM

## 2022-03-19 MED ORDER — NA SULFATE-K SULFATE-MG SULF 17.5-3.13-1.6 GM/177ML PO SOLN: 2.0000 | Freq: Once | ORAL | 0 refills | Status: AC

## 2022-03-19 NOTE — Progress Notes (Signed)
No egg or soy allergy known to patient  ?No issues known to pt with past sedation with any surgeries or procedures ?Patient denies ever being told they had issues or difficulty with intubation  ?No FH of Malignant Hyperthermia ?Pt is not on diet pills ?Pt is not on  home 02  ?Pt is not on blood thinners  ?Pt denies issues with constipation  ?No A fib or A flutter ? ?Pt instructed to use Singlecare.com or GoodRx for a price reduction on prep  ? ?PV completed over the phone. Pt verified name, DOB, address and insurance during PV today.  ? ?Pt encouraged to call with questions or issues.  ?If pt has My chart, procedure instructions sent via My Chart  ?Insurance confirmed with pt at [facility] today   ?

## 2022-03-21 ENCOUNTER — Other Ambulatory Visit: Payer: Self-pay | Admitting: Family Medicine

## 2022-03-21 DIAGNOSIS — F418 Other specified anxiety disorders: Secondary | ICD-10-CM

## 2022-03-24 ENCOUNTER — Other Ambulatory Visit (HOSPITAL_COMMUNITY): Payer: Self-pay

## 2022-03-24 DIAGNOSIS — R197 Diarrhea, unspecified: Secondary | ICD-10-CM | POA: Diagnosis not present

## 2022-03-24 DIAGNOSIS — H109 Unspecified conjunctivitis: Secondary | ICD-10-CM | POA: Diagnosis not present

## 2022-03-24 HISTORY — PX: COLONOSCOPY: SHX174

## 2022-03-24 HISTORY — PX: POLYPECTOMY: SHX149

## 2022-03-31 ENCOUNTER — Encounter: Payer: Self-pay | Admitting: Gastroenterology

## 2022-03-31 ENCOUNTER — Ambulatory Visit (AMBULATORY_SURGERY_CENTER): Payer: 59 | Admitting: Gastroenterology

## 2022-03-31 VITALS — BP 106/71 | HR 80 | Temp 97.5°F | Resp 16 | Ht 74.0 in | Wt 228.0 lb

## 2022-03-31 DIAGNOSIS — Z1211 Encounter for screening for malignant neoplasm of colon: Secondary | ICD-10-CM | POA: Diagnosis not present

## 2022-03-31 DIAGNOSIS — F419 Anxiety disorder, unspecified: Secondary | ICD-10-CM | POA: Diagnosis not present

## 2022-03-31 DIAGNOSIS — D123 Benign neoplasm of transverse colon: Secondary | ICD-10-CM | POA: Diagnosis not present

## 2022-03-31 DIAGNOSIS — D127 Benign neoplasm of rectosigmoid junction: Secondary | ICD-10-CM | POA: Diagnosis not present

## 2022-03-31 DIAGNOSIS — D124 Benign neoplasm of descending colon: Secondary | ICD-10-CM

## 2022-03-31 DIAGNOSIS — D125 Benign neoplasm of sigmoid colon: Secondary | ICD-10-CM | POA: Diagnosis not present

## 2022-03-31 MED ORDER — SODIUM CHLORIDE 0.9 % IV SOLN: 500.0000 mL | Freq: Once | INTRAVENOUS | Status: DC

## 2022-03-31 NOTE — Progress Notes (Signed)
PT taken to PACU. Monitors in place. VSS. Report given to RN. He is passing air. Complaining of abdominal discomfort. MD and RN notified.  ?

## 2022-03-31 NOTE — Progress Notes (Signed)
Called to room to assist during endoscopic procedure.  Patient ID and intended procedure confirmed with present staff. Received instructions for my participation in the procedure from the performing physician.  

## 2022-03-31 NOTE — Patient Instructions (Signed)
?  Handout on polyps given. ? Clip card given.  ? ?YOU HAD AN ENDOSCOPIC PROCEDURE TODAY AT [facility]:   Refer to the procedure report that was given to you for any specific questions about what was found during the examination.  If the procedure report does not answer your questions, please call your gastroenterologist to clarify.  If you requested that your care partner not be given the details of your procedure findings, then the procedure report has been included in a sealed envelope for you to review at your convenience later. ? ?YOU SHOULD EXPECT: Some feelings of bloating in the abdomen. Passage of more gas than usual.  Walking can help get rid of the air that was put into your GI tract during the procedure and reduce the bloating. If you had a lower endoscopy (such as a colonoscopy or flexible sigmoidoscopy) you may notice spotting of blood in your stool or on the toilet paper. If you underwent a bowel prep for your procedure, you may not have a normal bowel movement for a few days. ? ?Please Note:  You might notice some irritation and congestion in your nose or some drainage.  This is from the oxygen used during your procedure.  There is no need for concern and it should clear up in a day or so. ? ?SYMPTOMS TO REPORT IMMEDIATELY: ? ?Following lower endoscopy (colonoscopy or flexible sigmoidoscopy): ? Excessive amounts of blood in the stool ? Significant tenderness or worsening of abdominal pains ? Swelling of the abdomen that is new, acute ? Fever of 100?F or higher ? ? ?For urgent or emergent issues, a gastroenterologist can be reached at any hour by calling [phone]. ?Do not use MyChart messaging for urgent concerns.  ? ? ?DIET:  We do recommend a small meal at first, but then you may proceed to your regular diet.  Drink plenty of fluids but you should avoid alcoholic beverages for 24 hours. ? ?ACTIVITY:  You should plan to take it easy for the rest of today and you should NOT  DRIVE or use heavy machinery until tomorrow (because of the sedation medicines used during the test).   ? ?FOLLOW UP: ?Our staff will call the number listed on your records 48-72 hours following your procedure to check on you and address any questions or concerns that you may have regarding the information given to you following your procedure. If we do not reach you, we will leave a message.  We will attempt to reach you two times.  During this call, we will ask if you have developed any symptoms of COVID 19. If you develop any symptoms (ie: fever, flu-like symptoms, shortness of breath, cough etc.) before then, please call [phone].  If you test positive for Covid 19 in the 2 weeks post procedure, please call and report this information to [place].   ? ?If any biopsies were taken you will be contacted by phone or by letter within the next 1-3 weeks.  Please call us at [phone] if you have not heard about the biopsies in 3 weeks.  ? ? ?SIGNATURES/CONFIDENTIALITY: ?You and/or your care partner have signed paperwork which will be entered into your electronic medical record.  These signatures attest to the fact that that the information above on your After Visit Summary has been reviewed and is understood.  Full responsibility of the confidentiality of this discharge information lies with you and/or your care-partner.  ?

## 2022-03-31 NOTE — Progress Notes (Signed)
Pt's states no medical or surgical changes since previsit or office visit. 

## 2022-03-31 NOTE — Progress Notes (Signed)
[facility] History and Physical ? ? ?Primary Care Physician:  Libby Maw, MD ? ? ?Reason for Procedure:   Colon cancer screening ? ?Plan:    Screening colonoscopy ? ? ? ? ?HPI: Alan Sanders is a 51 y.o. male undergoing initial average risk screening colonoscopy.  He has no family history of colon cancer and no chronic GI symptoms other than occasional loose, frequent stools.  ? ? ?Past Medical History:  ?Diagnosis Date  ? Anxiety   ? Arthritis   ? " in my back "  ? Asthma   ? Balance problem   ? Gait difficulty   ? GERD (gastroesophageal reflux disease)   ? Headache   ? migraines  ? Inguinal hernia of left side without obstruction or gangrene   ? Pneumonia   ? Seizures ([place])   ? " its been along time ,since my last seizure "  ? Spleen enlarged   ? Thrombocytopenia ([place])   ? Tobacco abuse   ? ? ?Past Surgical History:  ?Procedure Laterality Date  ? ANTERIOR CERVICAL DECOMP/DISCECTOMY FUSION N/A 04/16/2021  ? Procedure: Cervical three-four  Anterior cervical decompression/discectomy/fusion;  Surgeon: Dawley, Theodoro Doing, DO;  Location: [facility];  Service: Neurosurgery;  Laterality: N/A;  ? HERNIA REPAIR    ? inguinal  ? SURAL NERVE BX Right 12/11/2020  ? Procedure: SURAL NERVE BIOPSY;  Surgeon: Karsten Ro, DO;  Location: [place] [facility];  Service: Neurosurgery;  Laterality: Right;  ? ? ?Prior to Admission medications   ?Medication Sig Start Date End Date Taking? Authorizing Provider  ?lidocaine (LIDODERM) 5 % Place 1 patch onto the skin daily. Remove & Discard patch within 12 hours or as directed by MD 02/16/22  Yes Hendricks Limes, PA-C  ?methocarbamol (ROBAXIN) 750 MG tablet Take 1 tablet by mouth 3 times every day as needed for spasms 02/27/22  Yes Libby Maw, MD  ?PARoxetine (PAXIL) 40 MG tablet Take 1 tablet (40 mg total) by mouth every morning. 12/10/21  Yes Libby Maw, MD  ?QUEtiapine (SEROQUEL) 25 MG tablet TAKE 1 TABLET BY MOUTH EVERYDAY AT BEDTIME 03/21/22  Yes Libby Maw, MD  ?acetaminophen (TYLENOL) 325 MG tablet Take 650 mg by mouth every 6 (six) hours as needed.    [provider]  ?albuterol (VENTOLIN HFA) 108 (90 Base) MCG/ACT inhaler Inhale 1-2 puffs into the lungs every 6 (six) hours as needed for wheezing or shortness of breath. 12/10/21   Libby Maw, MD  ?gabapentin (NEURONTIN) 300 MG capsule Take 1 capsule by mouth 3 times daily. ?Patient not taking: Reported on 03/31/2022 02/27/22   Libby Maw, MD  ?loperamide (IMODIUM) 2 MG capsule Take by mouth. 03/17/22   [provider]  ?traMADol Veatrice Bourbon) 50 MG tablet May take one at night as needed for pain. ?Patient not taking: Reported on 03/31/2022 02/27/22   Libby Maw, MD  ? ? ?Current Outpatient Medications  ?Medication Sig Dispense Refill  ? lidocaine (LIDODERM) 5 % Place 1 patch onto the skin daily. Remove & Discard patch within 12 hours or as directed by MD 30 patch 0  ? methocarbamol (ROBAXIN) 750 MG tablet Take 1 tablet by mouth 3 times every day as needed for spasms 90 tablet 2  ? PARoxetine (PAXIL) 40 MG tablet Take 1 tablet (40 mg total) by mouth every morning. 90 tablet 1  ? QUEtiapine (SEROQUEL) 25 MG tablet TAKE 1 TABLET BY MOUTH EVERYDAY AT BEDTIME 90 tablet 1  ?  acetaminophen (TYLENOL) 325 MG tablet Take 650 mg by mouth every 6 (six) hours as needed.    ? albuterol (VENTOLIN HFA) 108 (90 Base) MCG/ACT inhaler Inhale 1-2 puffs into the lungs every 6 (six) hours as needed for wheezing or shortness of breath. 8 g 2  ? gabapentin (NEURONTIN) 300 MG capsule Take 1 capsule by mouth 3 times daily. (Patient not taking: Reported on 03/31/2022) 90 capsule 2  ? loperamide (IMODIUM) 2 MG capsule Take by mouth.    ? traMADol (ULTRAM) 50 MG tablet May take one at night as needed for pain. (Patient not taking: Reported on 03/31/2022) 15 tablet 0  ? ?Current Facility-Administered Medications  ?Medication Dose Route Frequency Provider Last Rate Last Admin  ? 0.9 %  sodium  chloride infusion  500 mL Intravenous Once Daryel November, MD      ? ? ?Allergies as of 03/31/2022 - Review Complete 03/31/2022  ?Allergen Reaction Noted  ? Hydromorphone Itching and Rash 12/18/2021  ? ? ?Family History  ?Problem Relation Age of Onset  ? Diabetes Mother   ? Hypertension Mother   ? COPD Mother   ? Alcoholism Brother   ? Colon polyps Neg Hx   ? Colon cancer Neg Hx   ? Esophageal cancer Neg Hx   ? Stomach cancer Neg Hx   ? Rectal cancer Neg Hx   ? ? ?Social History  ? ?Socioeconomic History  ? Marital status: Married  ?  Spouse name: Not on file  ? Number of children: 2  ? Years of education: Not on file  ? Highest education level: High school graduate  ?Occupational History  ? Not on file  ?Tobacco Use  ? Smoking status: Every Day  ?  Packs/day: 0.50  ?  Years: 24.00  ?  Pack years: 12.00  ?  Types: Cigarettes  ? Smokeless tobacco: Current  ?  Types: Snuff  ?Vaping Use  ? Vaping Use: Never used  ?Substance and Sexual Activity  ? Alcohol use: No  ? Drug use: No  ? Sexual activity: Not on file  ?Other Topics Concern  ? Not on file  ?Social History Narrative  ? Lives with spouse  ? Sweet tea, energy drinks, Diet Coke, work 12 hr shifts  ? ?Social Determinants of Health  ? ?Financial Resource Strain: Not on file  ?Food Insecurity: Not on file  ?Transportation Needs: Not on file  ?Physical Activity: Not on file  ?Stress: Not on file  ?Social Connections: Not on file  ?Intimate Partner Violence: Not on file  ? ? ?Review of Systems: ? ?All other review of systems negative except as mentioned in the HPI. ? ?Physical Exam: ?Vital signs ?BP 123/79   Pulse 97   Temp (!) 97.5 ?F (36.4 ?C)   Ht '6\' 2"'$  (1.88 m)   Wt 228 lb (103.4 kg)   SpO2 95%   BMI 29.27 kg/m?  ? ?General:   Alert,  Well-developed, well-nourished, pleasant and cooperative in NAD ?Airway:  Mallampati 3 ?Lungs:  Clear throughout to auscultation.   ?Heart:  Regular rate and rhythm; no murmurs, clicks, rubs,  or gallops. ?Abdomen:  Soft,  nontender and nondistended. Normal bowel sounds.   ?Neuro/Psych:  Normal mood and affect. A and O x 3 ? ? ?Jesselle Laflamme E. Candis Schatz, MD ?[facility] ? ?

## 2022-03-31 NOTE — Op Note (Signed)
[place] ?Patient Name: Alan Sanders ?Procedure Date: 03/31/2022 10:15 AM ?MRN: [id number] ?Endoscopist: Carlia Bomkamp E. Candis Schatz , MD ?Age: 51 ?Referring MD:  ?Date of Birth: 02/24/1971 ?Gender: Male ?Account #: [id number] ?Procedure:                Colonoscopy ?Indications:              Screening for colorectal malignant neoplasm, This  ?                          is the patient's first colonoscopy ?Medicines:                Monitored Anesthesia Care ?Procedure:                Pre-Anesthesia Assessment: ?                          - Prior to the procedure, a History and Physical  ?                          was performed, and patient medications and  ?                          allergies were reviewed. The patient's tolerance of  ?                          previous anesthesia was also reviewed. The risks  ?                          and benefits of the procedure and the sedation  ?                          options and risks were discussed with the patient.  ?                          All questions were answered, and informed consent  ?                          was obtained. Prior Anticoagulants: The patient has  ?                          taken no previous anticoagulant or antiplatelet  ?                          agents. ASA Grade Assessment: II - A patient with  ?                          mild systemic disease. After reviewing the risks  ?                          and benefits, the patient was deemed in  ?                          satisfactory condition to undergo the procedure. ?  After obtaining informed consent, the colonoscope  ?                          was passed under direct vision. Throughout the  ?                          procedure, the patient's blood pressure, pulse, and  ?                          oxygen saturations were monitored continuously. The  ?                          Colonoscope was introduced through the anus and  ?                          advanced to the the  terminal ileum, with  ?                          identification of the appendiceal orifice and IC  ?                          valve. The colonoscopy was performed without  ?                          difficulty. The patient tolerated the procedure  ?                          well. The quality of the bowel preparation was  ?                          adequate. The terminal ileum, ileocecal valve,  ?                          appendiceal orifice, and rectum were photographed.  ?                          The bowel preparation used was SUPREP via split  ?                          dose instruction. ?Scope In: 10:37:00 AM ?Scope Out: 11:34:52 AM ?Scope Withdrawal Time: 0 hours 49 minutes 6 seconds  ?Total Procedure Duration: 0 hours 57 minutes 52 seconds  ?Findings:                 The perianal and digital rectal examinations were  ?                          normal. Pertinent negatives include normal  ?                          sphincter tone and no palpable rectal lesions. ?                          Ten sessile polyps were found in the recto-sigmoid  ?  colon, descending colon, splenic flexure,  ?                          transverse colon and hepatic flexure. The polyps  ?                          were 4 to 8 mm in size. These polyps were removed  ?                          with a cold snare. Resection and retrieval were  ?                          complete. Estimated blood loss was minimal. ?                          A 20 mm polyp was found in the distal transverse  ?                          colon. The polyp was sessile. The polyp was removed  ?                          piecemeal with a saline injection-lift technique  ?                          using a hot snare. Resection and retrieval were  ?                          complete. To prevent bleeding after the  ?                          polypectomy, two hemostatic clips were successfully  ?                          placed (MR conditional). There  was no bleeding at  ?                          the end of the procedure. Area was tattooed with an  ?                          injection of 2 mL of Spot (carbon black) (2-3 cm  ?                          distal to the polypectomy site). Estimated blood  ?                          loss was minimal. ?                          A 15 mm polyp was found in the sigmoid colon. The  ?                          polyp was pedunculated. The polyp was removed with  ?  a hot snare. Resection and retrieval were complete.  ?                          Biopsies of the polypectomy site were taken with a  ?                          cold forceps for histology to exclude residual  ?                          polyp. Estimated blood loss was minimal. ?                          A 8 mm polyp was found in the sigmoid colon. The  ?                          polyp was semi-pedunculated. The polyp was removed  ?                          with a cold snare. Resection and retrieval were  ?                          complete. Estimated blood loss was minimal. ?                          The exam was otherwise normal throughout the  ?                          examined colon. ?                          The terminal ileum appeared normal. ?                          The retroflexed view of the distal rectum and anal  ?                          verge was normal and showed no anal or rectal  ?                          abnormalities. ?Complications:            No immediate complications. ?Estimated Blood Loss:     Estimated blood loss was minimal. ?Impression:               - Ten 4 to 8 mm polyps at the recto-sigmoid colon,  ?                          in the descending colon, at the splenic flexure, in  ?                          the transverse colon and at the hepatic flexure,  ?                          removed with a cold snare. Resected and  retrieved. ?                          - One 20 mm polyp in the distal transverse colon,  ?                           removed using injection-lift and a hot snare.  ?                          Resected and retrieved. Clips (MR conditional) were  ?                          placed. Tattooed. ?                          - One 15 mm polyp in the sigmoid colon, removed  ?                          with a hot snare. Resected and retrieved. Biopsied. ?                          - One 8 mm polyp in the sigmoid colon, removed with  ?                          a cold snare. Resected and retrieved. ?                          - The examined portion of the ileum was normal. ?                          - The distal rectum and anal verge are normal on  ?                          retroflexion view. ?Recommendation:           - Patient has a contact number available for  ?                          emergencies. The signs and symptoms of potential  ?                          delayed complications were discussed with the  ?                          patient. Return to normal activities tomorrow.  ?                          Written discharge instructions were provided to the  ?                          patient. ?                          - Resume previous diet. ?                          -  Continue present medications. ?                          - Await pathology results. ?                          - Repeat colonoscopy in 6 months for surveillance  ?                          after piecemeal polypectomy. ?                          - Recommend genetic testing given large number of  ?                          polyps. ?Vedder Brittian E. Candis Schatz, MD ?03/31/2022 11:46:02 AM ?This report has been signed electronically. ?

## 2022-04-01 ENCOUNTER — Other Ambulatory Visit: Payer: Self-pay

## 2022-04-01 ENCOUNTER — Telehealth: Payer: Self-pay | Admitting: Genetic Counselor

## 2022-04-01 DIAGNOSIS — K635 Polyp of colon: Secondary | ICD-10-CM

## 2022-04-01 NOTE — Telephone Encounter (Signed)
Scheduled appt per 5/9 referral. Pt is aware of appt date and time. Pt is aware to arrive 15 mins prior to appt time and to bring and updated insurance card. Pt is aware of appt location.   ?

## 2022-04-04 ENCOUNTER — Telehealth: Payer: Self-pay | Admitting: Genetic Counselor

## 2022-04-04 NOTE — Telephone Encounter (Signed)
.  Called pt per 5/12 inbasket , Patient was unavailable, a message with appt time and date was left with number on file.   ?

## 2022-04-08 NOTE — Progress Notes (Signed)
Alan Sanders,  ?All of the polyps removed were tubular adenomas, which are 'pre-cancerous' growths of the colon.  The large sigmoid polyp was a tubulovillous adenoma, which is a more advanced polyp that had a greater chance of turning into cancer had it not been removed. ?Because you had one large flat tubular adenoma removed in a piecemeal fashion, it is recommended you repeat colonoscopy in 6 months. ?

## 2022-04-23 ENCOUNTER — Other Ambulatory Visit (HOSPITAL_COMMUNITY): Payer: Self-pay

## 2022-04-25 ENCOUNTER — Other Ambulatory Visit (HOSPITAL_COMMUNITY): Payer: Self-pay

## 2022-04-25 MED ORDER — METHOCARBAMOL 750 MG PO TABS
ORAL_TABLET | ORAL | 2 refills | Status: DC
  Filled 2022-04-25: qty 90, 30d supply, fill #0
  Filled 2022-05-20: qty 90, 30d supply, fill #1
  Filled 2022-06-06: qty 90, 30d supply, fill #2

## 2022-05-01 ENCOUNTER — Telehealth: Payer: Self-pay | Admitting: Genetic Counselor

## 2022-05-01 NOTE — Telephone Encounter (Signed)
.  Called patient to clarify ppointment per 6/8 inbasket, left pt msg

## 2022-05-03 ENCOUNTER — Other Ambulatory Visit: Payer: Self-pay

## 2022-05-03 ENCOUNTER — Encounter (HOSPITAL_COMMUNITY): Payer: Self-pay

## 2022-05-03 ENCOUNTER — Emergency Department (HOSPITAL_COMMUNITY)
Admission: EM | Admit: 2022-05-03 | Discharge: 2022-05-03 | Disposition: A | Discharge status: Home / Self Care | Payer: 59 | Attending: Emergency Medicine | Admitting: Emergency Medicine

## 2022-05-03 ENCOUNTER — Emergency Department (HOSPITAL_COMMUNITY): Payer: 59

## 2022-05-03 DIAGNOSIS — S8992XA Unspecified injury of left lower leg, initial encounter: Secondary | ICD-10-CM | POA: Diagnosis present

## 2022-05-03 DIAGNOSIS — S80211A Abrasion, right knee, initial encounter: Secondary | ICD-10-CM | POA: Insufficient documentation

## 2022-05-03 DIAGNOSIS — W010XXA Fall on same level from slipping, tripping and stumbling without subsequent striking against object, initial encounter: Secondary | ICD-10-CM | POA: Insufficient documentation

## 2022-05-03 DIAGNOSIS — M542 Cervicalgia: Secondary | ICD-10-CM | POA: Insufficient documentation

## 2022-05-03 DIAGNOSIS — M25561 Pain in right knee: Secondary | ICD-10-CM | POA: Diagnosis not present

## 2022-05-03 DIAGNOSIS — R102 Pelvic and perineal pain: Secondary | ICD-10-CM | POA: Diagnosis not present

## 2022-05-03 DIAGNOSIS — M5031 Other cervical disc degeneration,  high cervical region: Secondary | ICD-10-CM | POA: Diagnosis not present

## 2022-05-03 DIAGNOSIS — W19XXXA Unspecified fall, initial encounter: Secondary | ICD-10-CM

## 2022-05-03 DIAGNOSIS — M25511 Pain in right shoulder: Secondary | ICD-10-CM | POA: Diagnosis not present

## 2022-05-03 DIAGNOSIS — M25551 Pain in right hip: Secondary | ICD-10-CM | POA: Diagnosis not present

## 2022-05-03 IMAGING — CR DG SHOULDER 1V*R*
1 series · 1 of 1 positions shown · non-contrast
Comparison: None Available.

CLINICAL DATA: Fall, right shoulder pain

EXAM:
RIGHT SHOULDER - 1 VIEW

[x shoulder ap right]
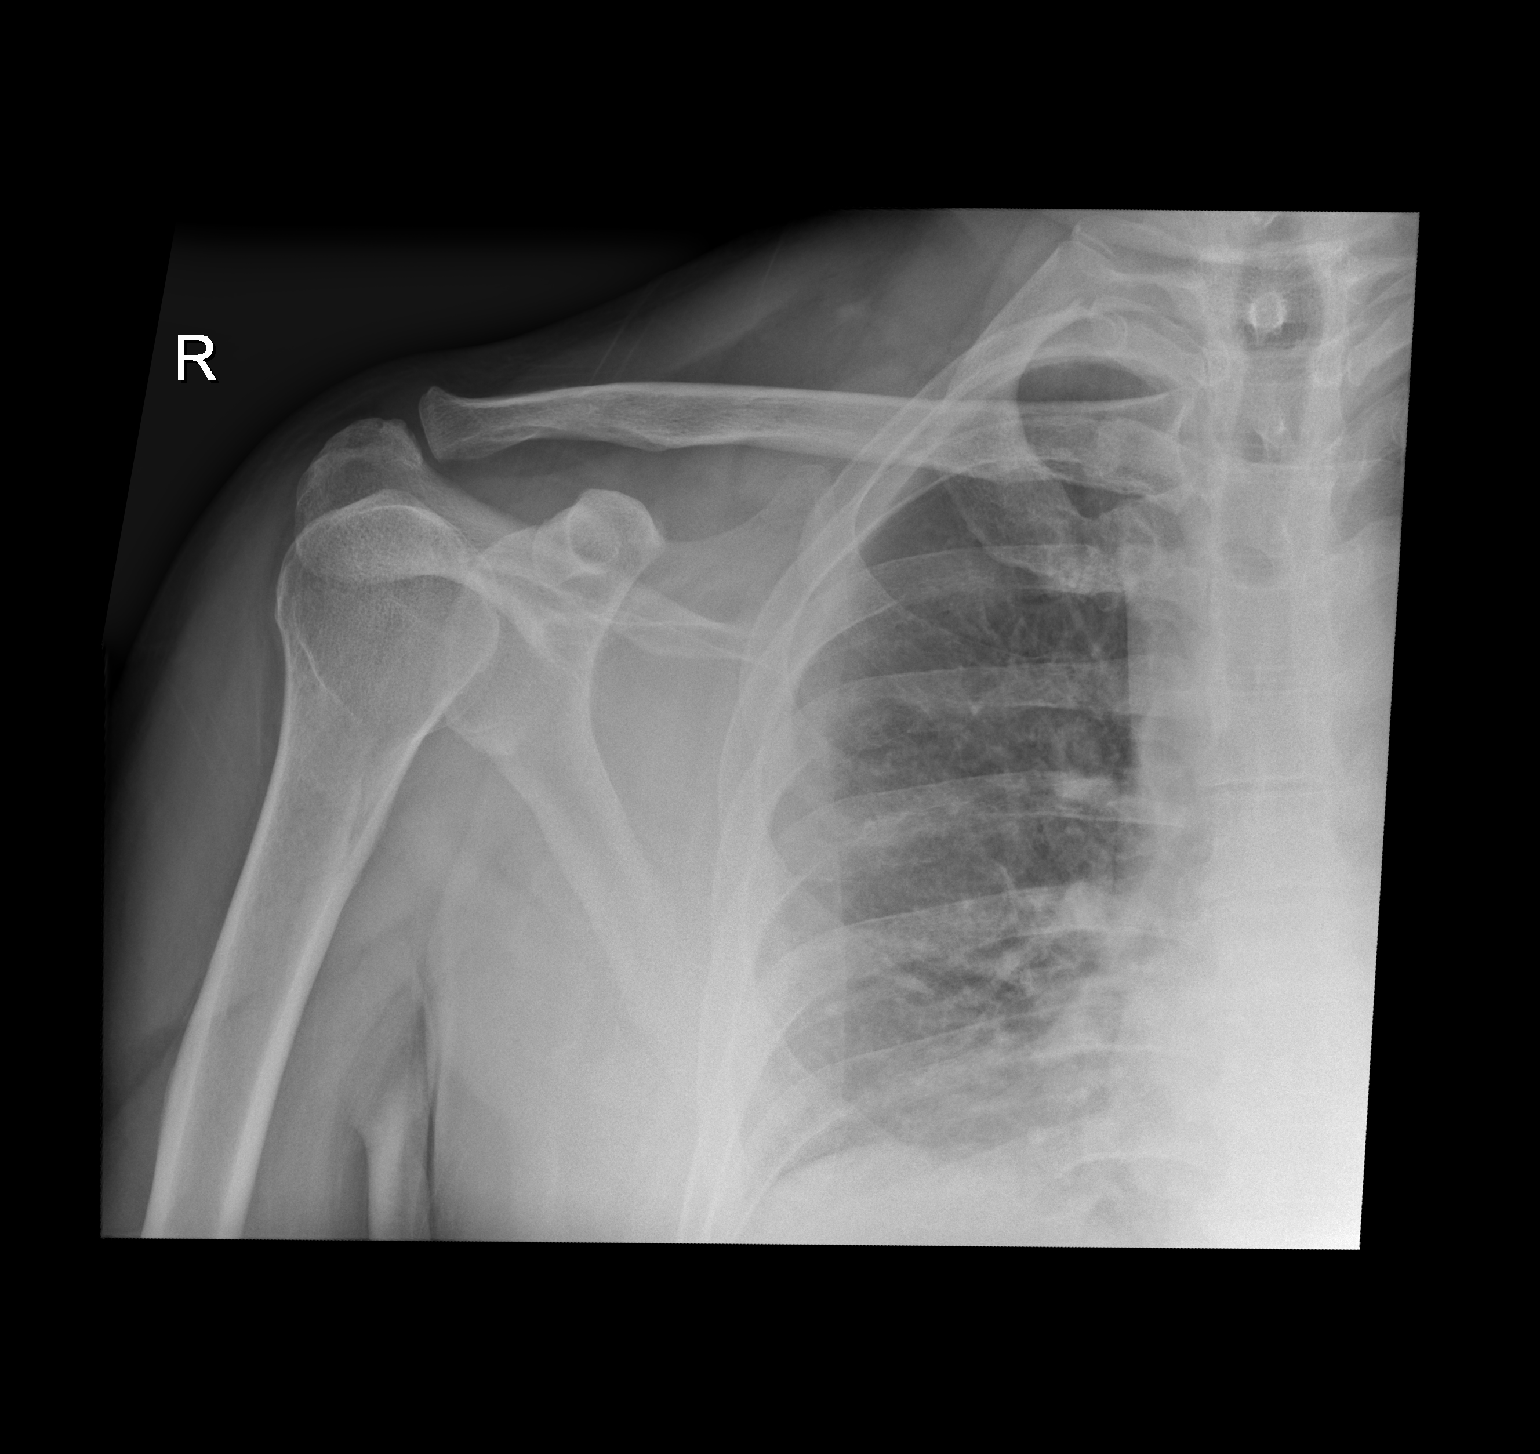

[1 of 1 positions shown; findings below may reference images not displayed]

FINDINGS: Single view radiograph the right shoulder demonstrates normal
alignment on this limited examination. No definite fracture or
dislocation. Acromioclavicular joint space is preserved.
Glenohumeral joint space is not well profiled. Limited evaluation of
the right hemithorax is unremarkable.
IMPRESSION: Limited, but unremarkable examination

## 2022-05-03 IMAGING — CT CT CERVICAL SPINE W/O CM
3 of 4 series · 14 of 33 positions shown, 17 images · non-contrast
Comparison: None Available.

CLINICAL DATA: Fall, neck pain



[Series 6: orthogonal bone · axial · 0.26mm/px · z∈[+1579,+1721]mm · 6 of 116 slices shown, 8 images]
[im 17/116  soft-tissue]
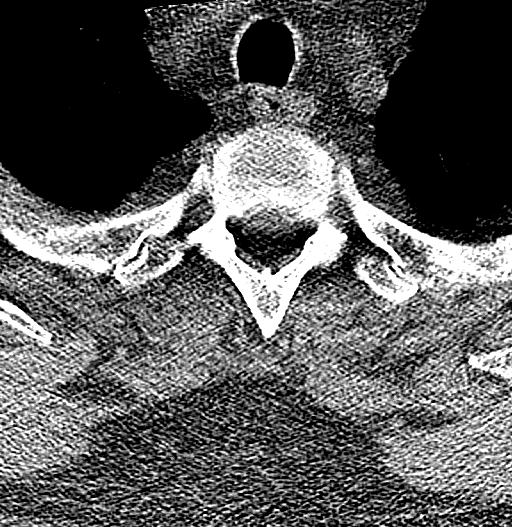
[im 17/116  bone]
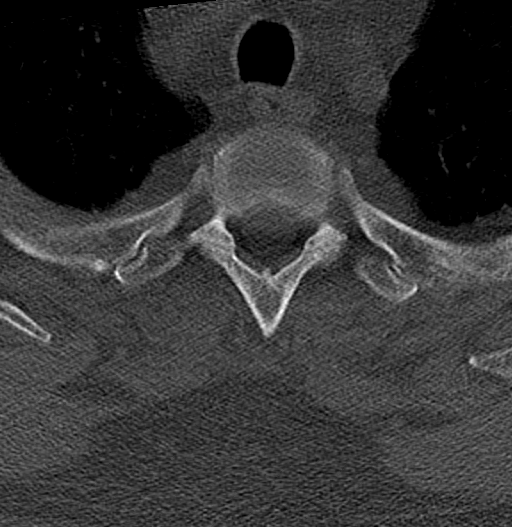
[im 33/116  bone]
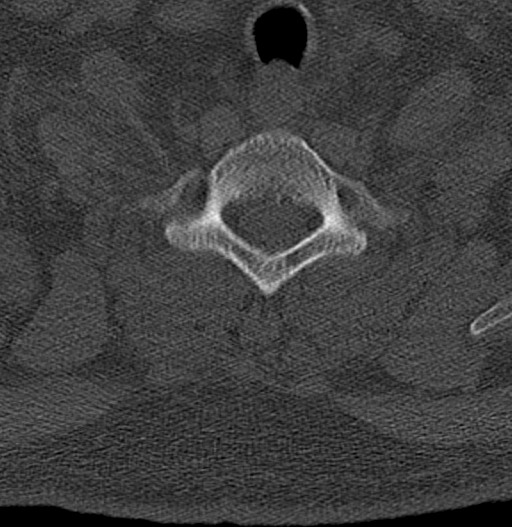
[im 50/116  bone]
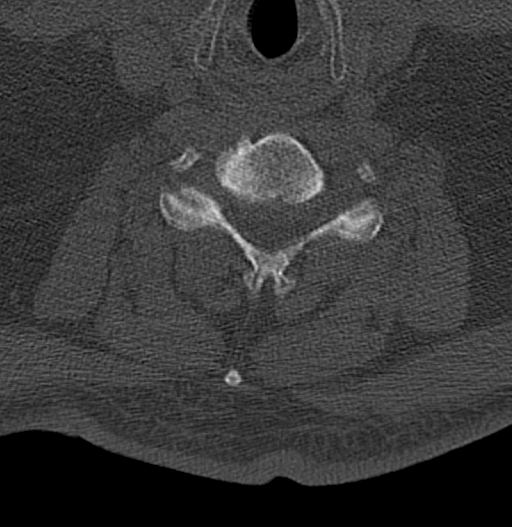
[im 66/116  bone]
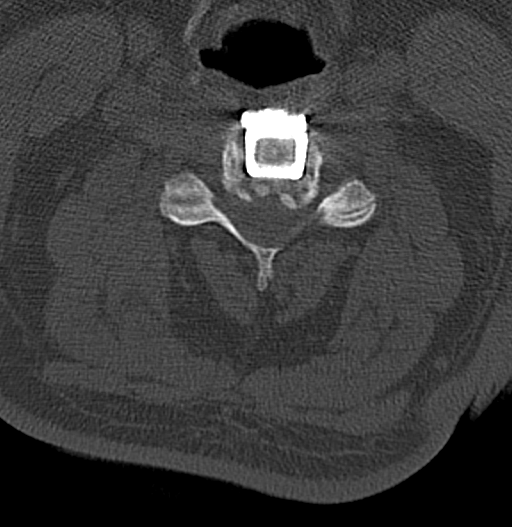
[im 83/116  soft-tissue]
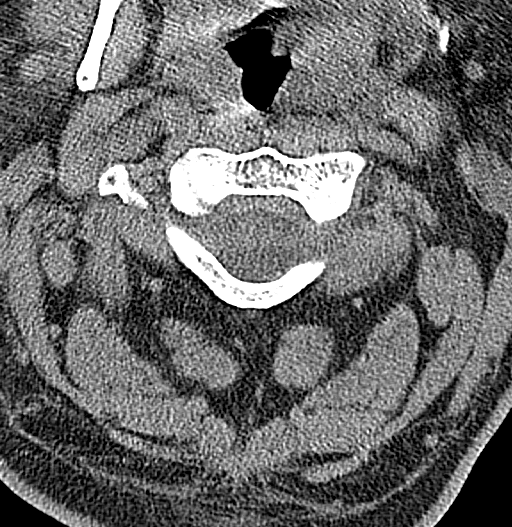
[im 83/116  bone]
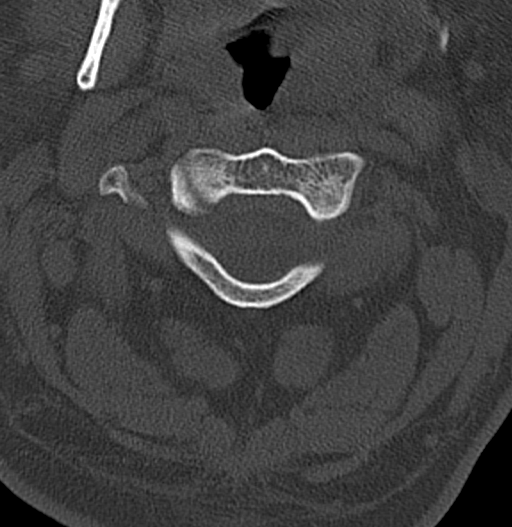
[im 99/116  bone]
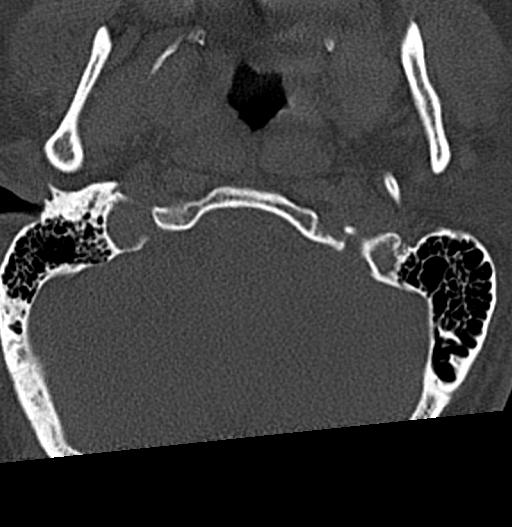

[Series 7: coronal bone · coronal · 0.29mm/px · 3 of 66 slices shown]
[im 18/66  bone]
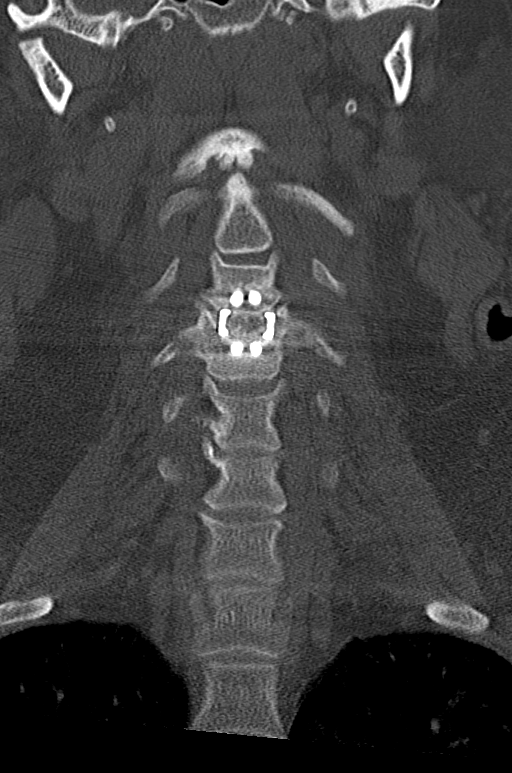
[im 28/66  bone]
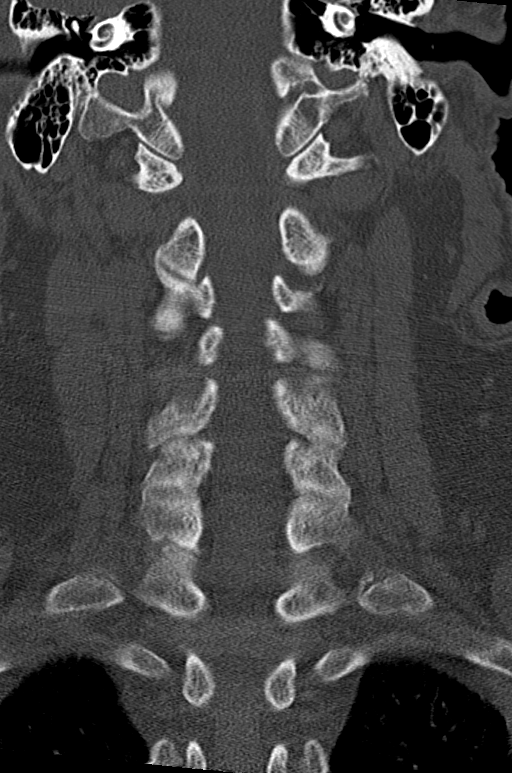
[im 38/66  bone]
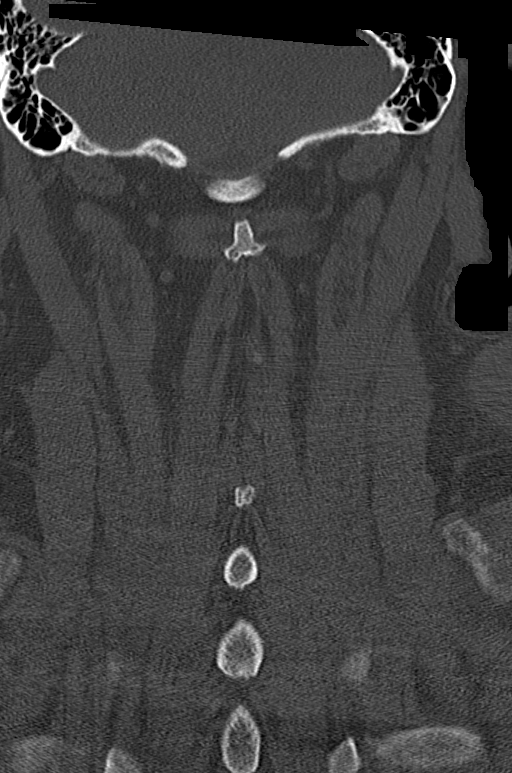

[Series 8: sagittal bone · sagittal · 0.36mm/px · 5 of 61 slices shown, 6 images]
[im 21/61  bone]
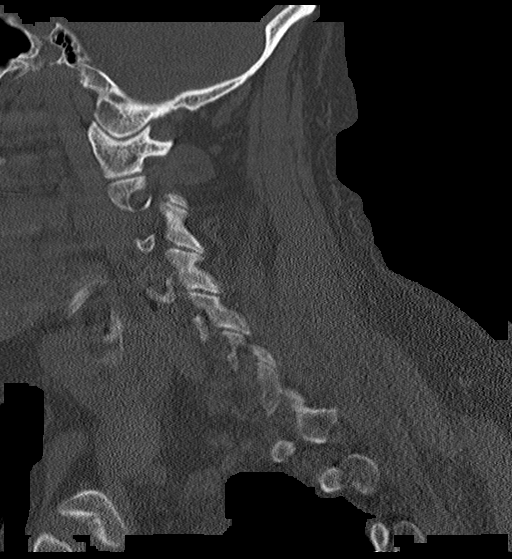
[im 26/61  bone]
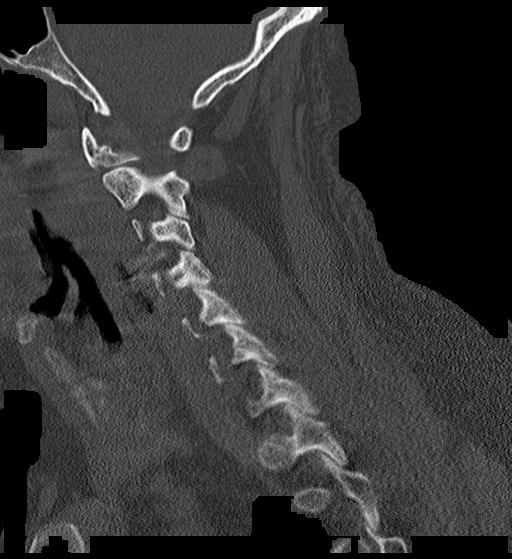
[im 31/61  soft-tissue]
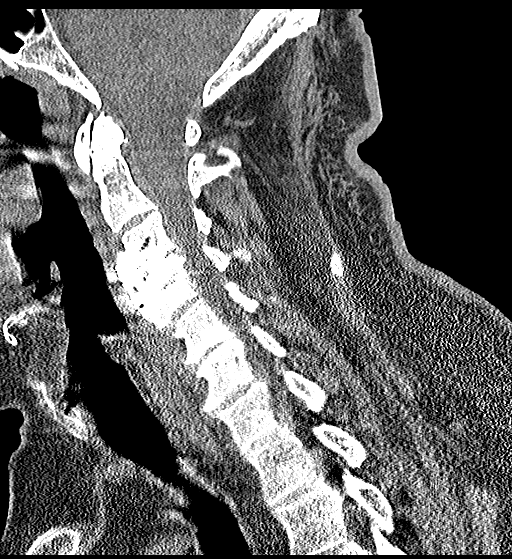
[im 31/61  bone]
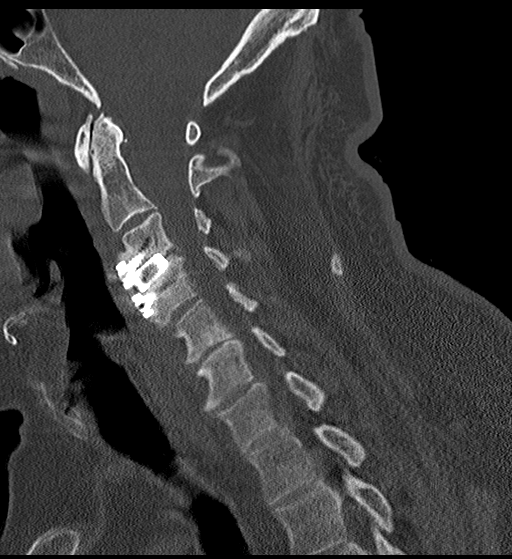
[im 36/61  bone]
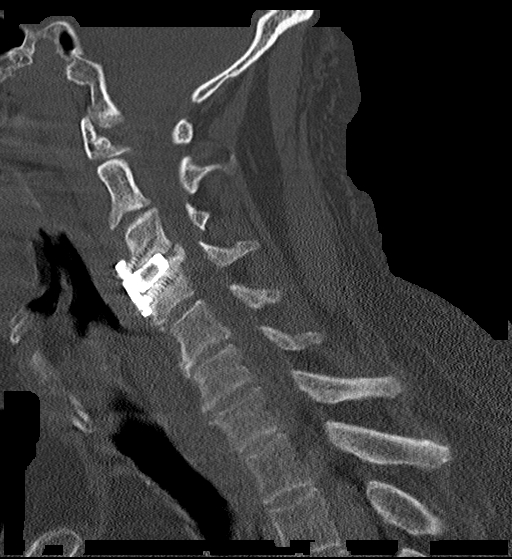
[im 41/61  bone]
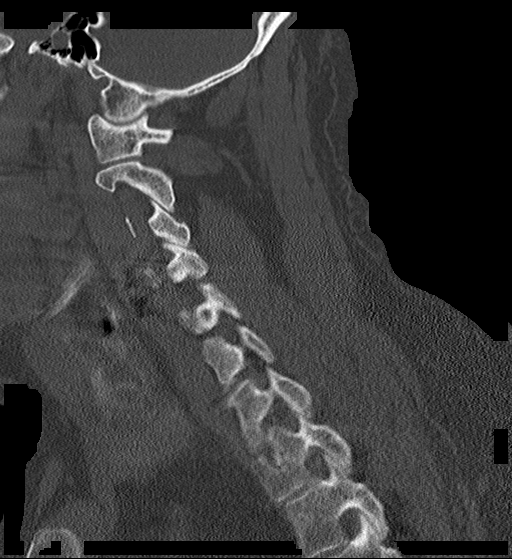

[14 of 33 positions shown; findings below may reference images not displayed]

FINDINGS: Alignment: Normal

Skull base and vertebrae: No acute fracture. No primary bone lesion
or focal pathologic process.

Soft tissues and spinal canal: No prevertebral fluid or swelling. No
visible canal hematoma.

Disc levels: Prior anterior fusion at C3-4. Disc space narrowing and
anterior spurring at C4-5 and C5-6.

Upper chest: No acute findings

Other: None
IMPRESSION: No acute bony abnormality.

Prior ACDF C3-4.

## 2022-05-03 IMAGING — CR DG HIP (WITH OR WITHOUT PELVIS) 2-3V*R*
3 series · 3 of 3 positions shown · non-contrast
Comparison: None Available.

CLINICAL DATA: Fall, pelvic pain, right hip pain

EXAM:
DG HIP (WITH OR WITHOUT PELVIS) 2-3V RIGHT

[t pelvis ap]
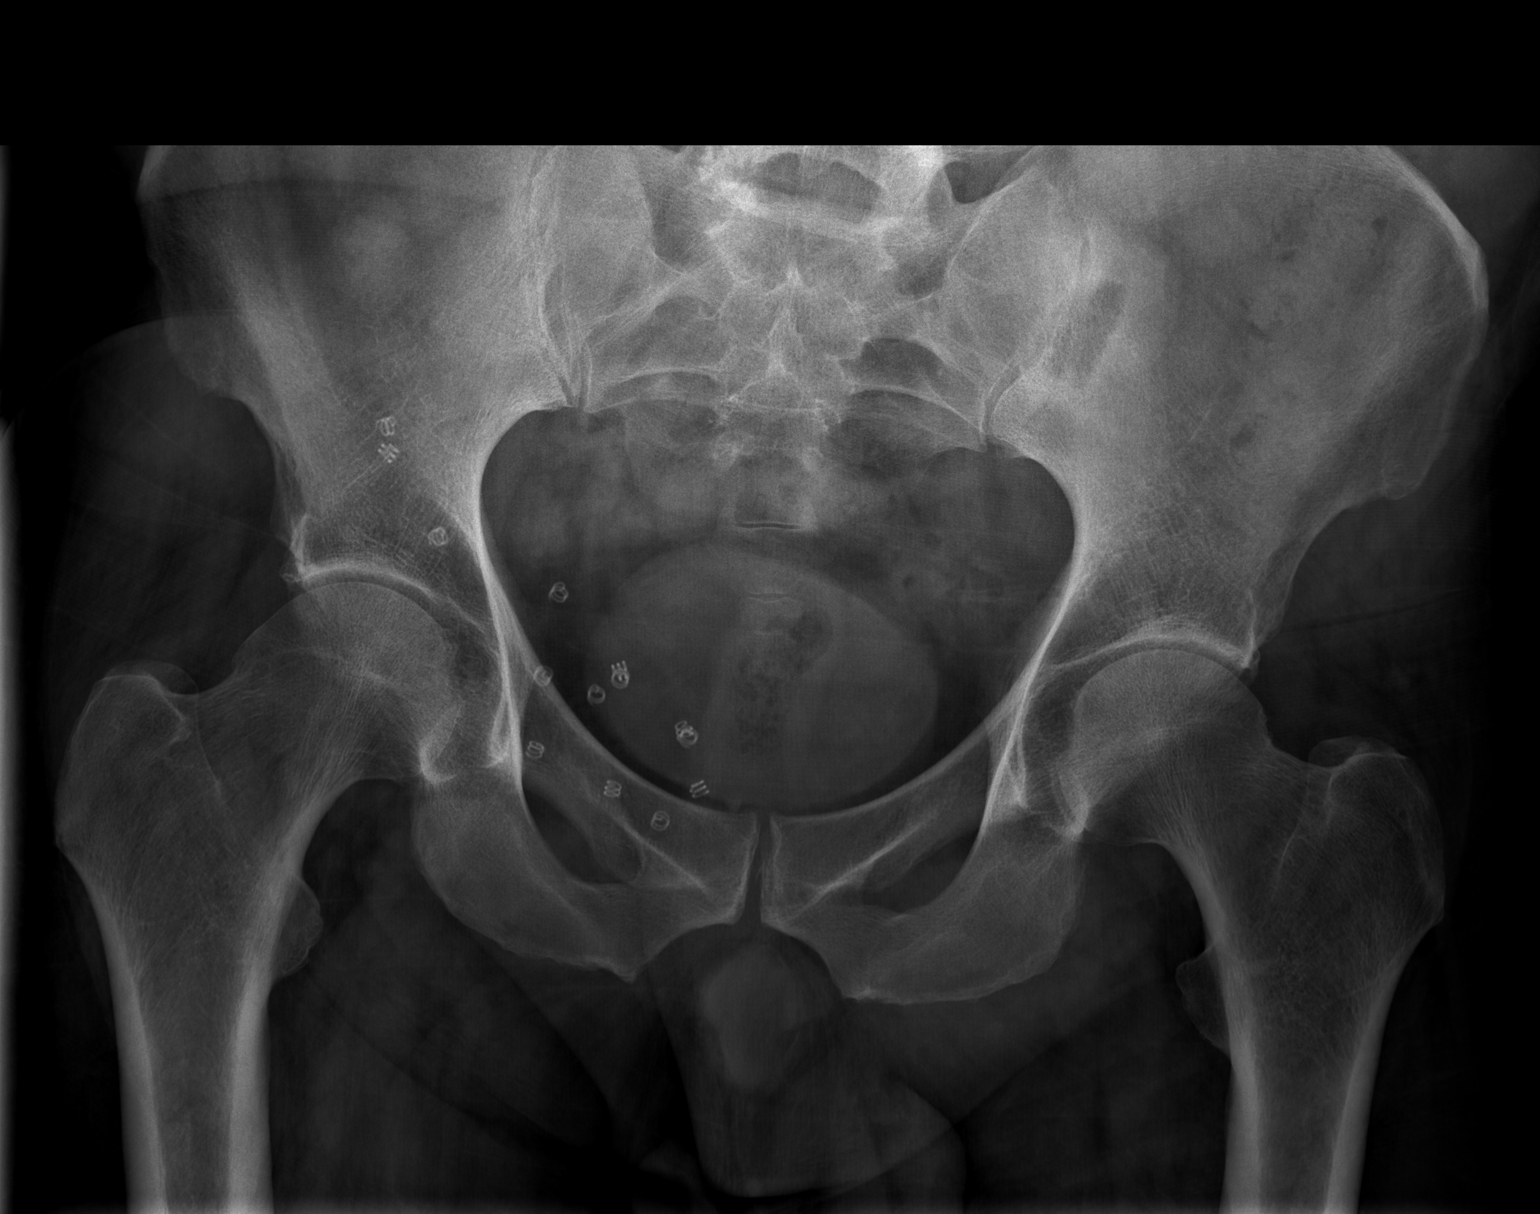

[t hip ap right]
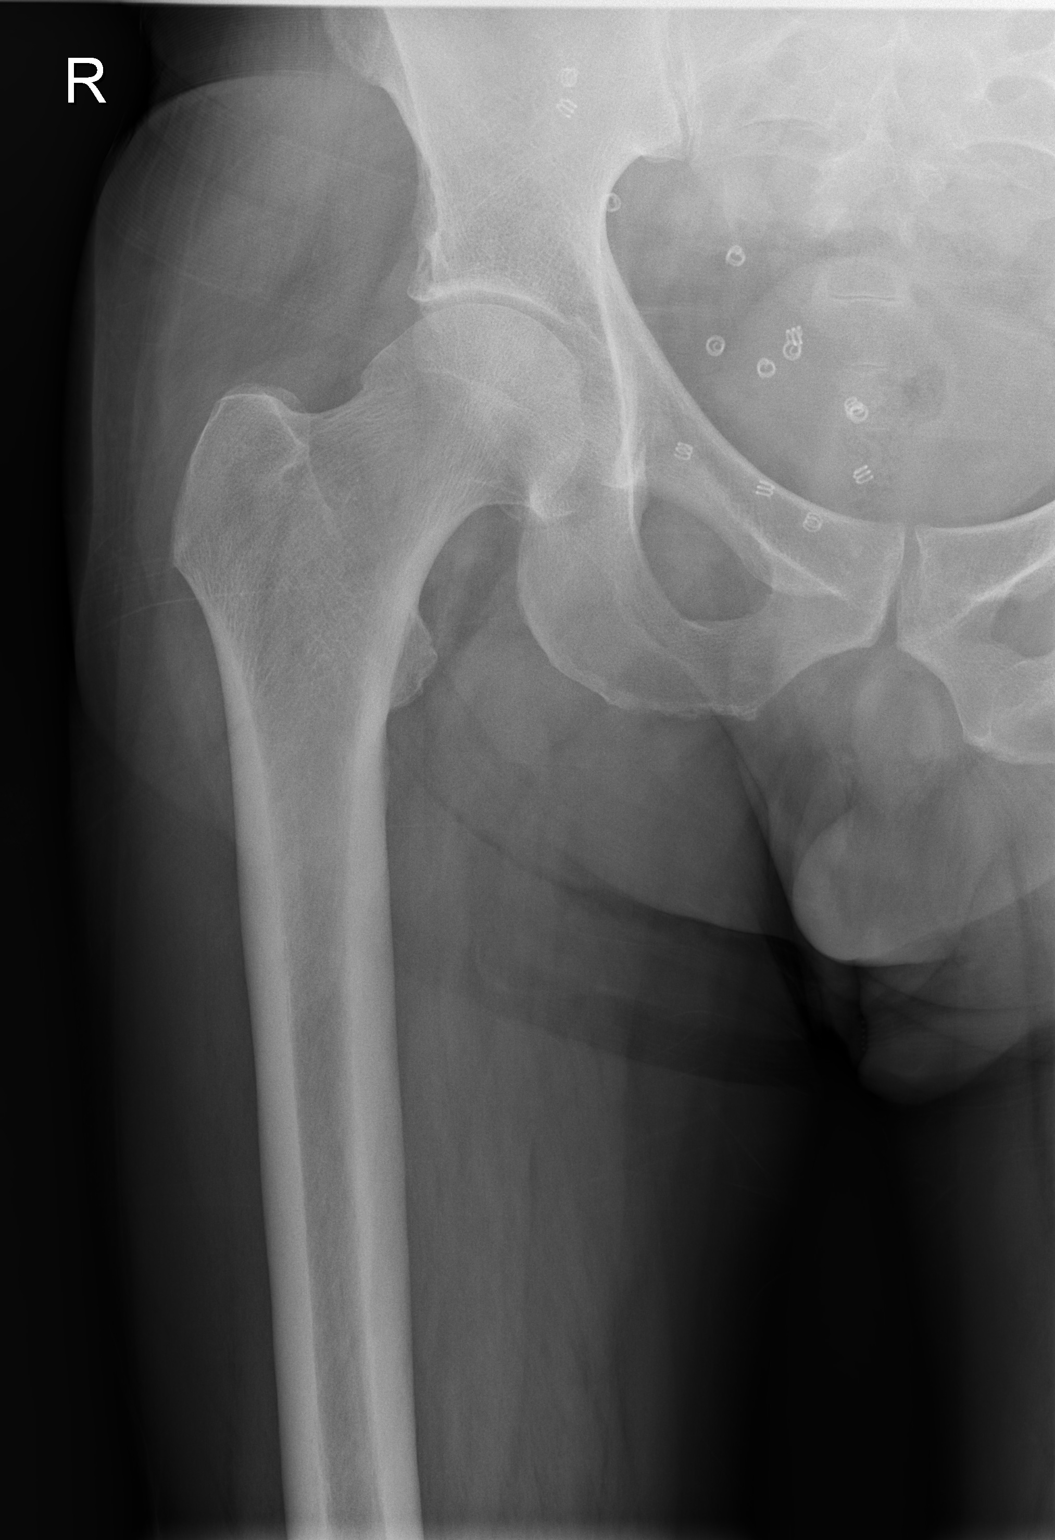

[t hip frog leg right]
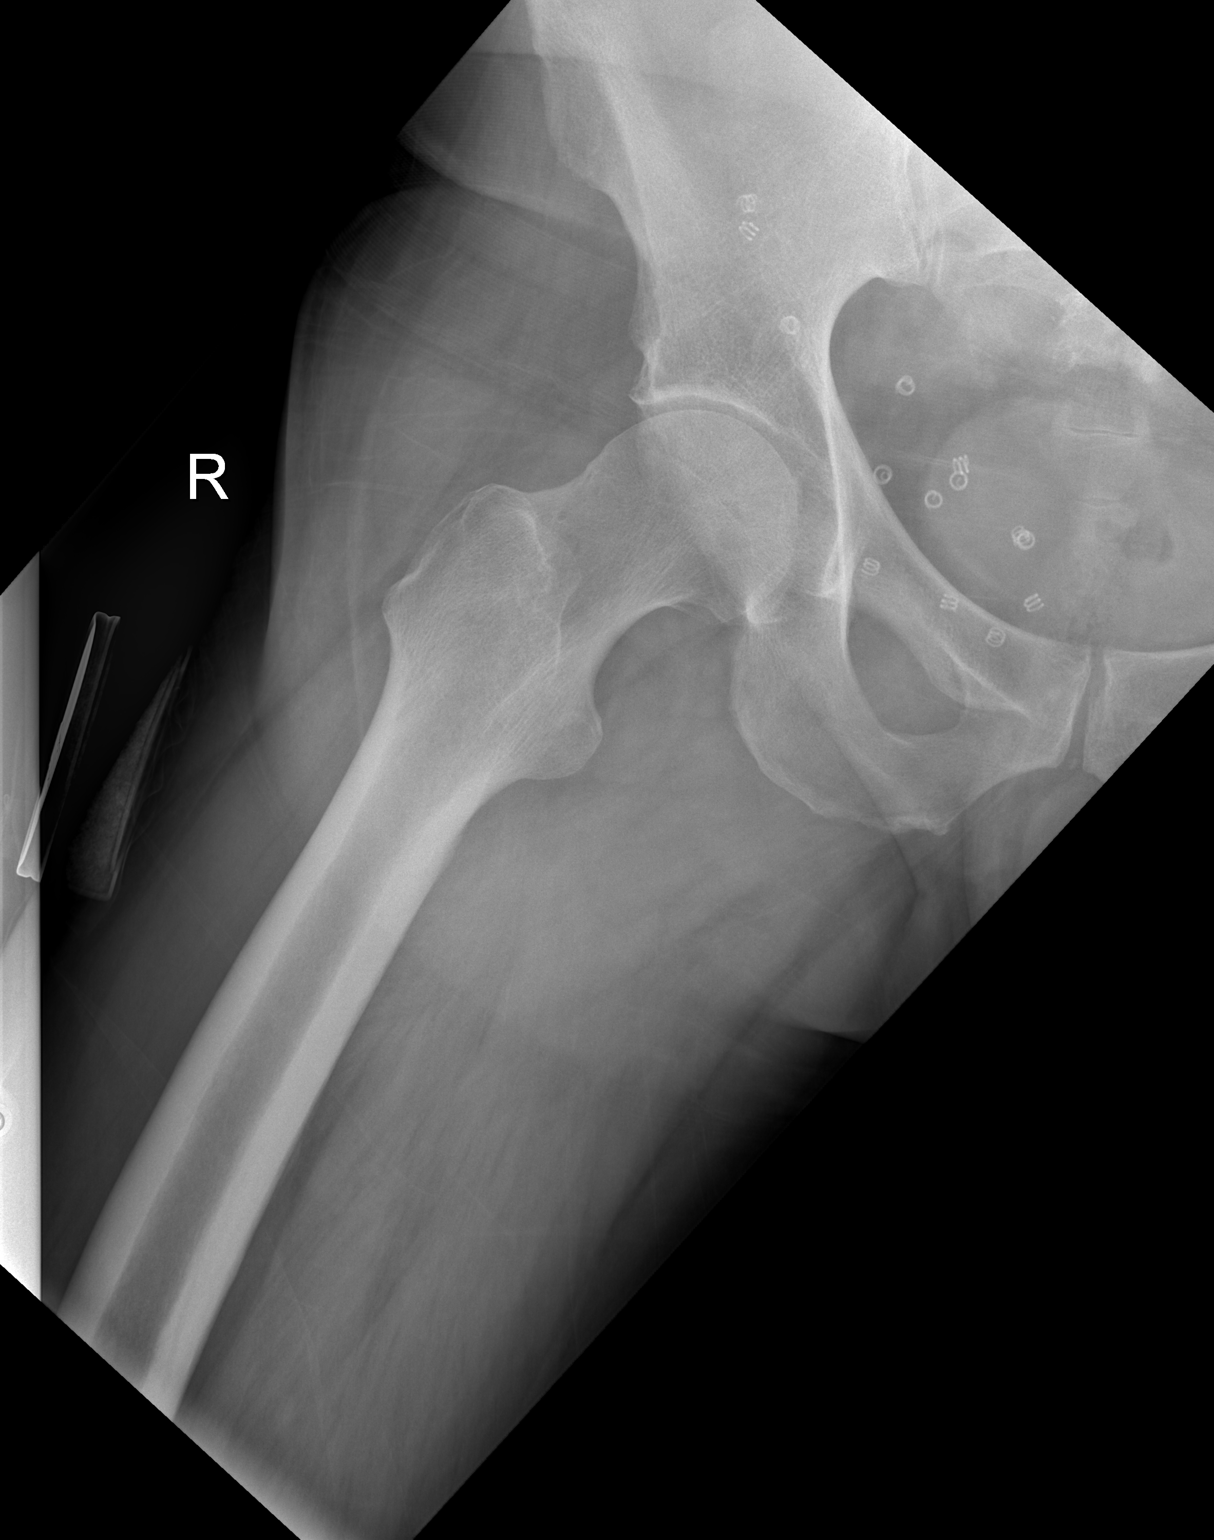

[3 of 3 positions shown; findings below may reference images not displayed]

FINDINGS: There is no evidence of hip fracture or dislocation. There is no
evidence of arthropathy or other focal bone abnormality. Right
inguinal hernia repair with mesh has been performed.
IMPRESSION: Negative.

## 2022-05-03 MED ORDER — MELOXICAM 7.5 MG PO TABS: 7.5000 mg | ORAL_TABLET | Freq: Every day | ORAL | 0 refills | Status: DC

## 2022-05-03 MED ORDER — OXYCODONE-ACETAMINOPHEN 5-325 MG PO TABS
1.0000 | ORAL_TABLET | Freq: Three times a day (TID) | ORAL | 0 refills | Status: DC | PRN
Start: 2022-05-03 — End: 2022-06-08

## 2022-05-03 MED ORDER — KETOROLAC TROMETHAMINE 60 MG/2ML IM SOLN
15.0000 mg | Freq: Once | INTRAMUSCULAR | Status: AC
Start: 2022-05-03 — End: 2022-05-03
  Administered 2022-05-03: 15 mg via INTRAMUSCULAR
  Filled 2022-05-03: qty 2

## 2022-05-03 MED ORDER — OXYCODONE-ACETAMINOPHEN 5-325 MG PO TABS
2.0000 | ORAL_TABLET | Freq: Once | ORAL | Status: AC
  Administered 2022-05-03: 2 via ORAL
  Filled 2022-05-03: qty 2

## 2022-05-03 MED ORDER — OXYCODONE-ACETAMINOPHEN 5-325 MG PO TABS: 1.0000 | ORAL_TABLET | Freq: Three times a day (TID) | ORAL | 0 refills | Status: DC | PRN

## 2022-05-03 NOTE — ED Triage Notes (Addendum)
To ED following mechanical fall after tripping over cat liter. C/o R hip, R knee, R shoulder and neck pain. Recently had neck surgery in May.

## 2022-05-03 NOTE — ED Provider Notes (Signed)
[facility] DEPT Provider Note   CSN: [id number] Arrival date & time: 05/03/22  2228     History  Chief Complaint  Patient presents with   Alan Sanders is a 51 y.o. male.  50 year old male presents the ER today after a fall.  Patient states Alan Sanders was walking and tripped over some cat litter and fell on his right side.  His right shoulder anteriorly, right knee is also having some neck pain.  States Alan Sanders had a neck surgery recently related to previous trauma.  Patient does not think things broken but it is really sore from the fall.  Her anything else.  Did not syncopize.  No other associated symptoms.   Fall       Home Medications Prior to Admission medications   Medication Sig Start Date End Date Taking? Authorizing Provider  meloxicam (MOBIC) 7.5 MG tablet Take 1 tablet (7.5 mg total) by mouth daily. 05/03/22  Yes Tomi Grandpre, Corene Cornea, MD  acetaminophen (TYLENOL) 325 MG tablet Take 650 mg by mouth every 6 (six) hours as needed.    [provider]  albuterol (VENTOLIN HFA) 108 (90 Base) MCG/ACT inhaler Inhale 1-2 puffs into the lungs every 6 (six) hours as needed for wheezing or shortness of breath. 12/10/21   Libby Maw, MD  gabapentin (NEURONTIN) 300 MG capsule Take 1 capsule by mouth 3 times daily. Patient not taking: Reported on 03/31/2022 02/27/22   Libby Maw, MD  lidocaine (LIDODERM) 5 % Place 1 patch onto the skin daily. Remove & Discard patch within 12 hours or as directed by MD 02/16/22   Hendricks Limes, PA-C  loperamide (IMODIUM) 2 MG capsule Take by mouth. 03/17/22   [provider]  methocarbamol (ROBAXIN) 750 MG tablet Take 1 tablet by mouth 3 times every day as needed for spasms 02/27/22   Libby Maw, MD  methocarbamol (ROBAXIN) 750 MG tablet Take 1 tablet by mouth three times daily for muscle spasms. 04/23/22   Dawley, Troy C, DO  oxyCODONE-acetaminophen (PERCOCET) 5-325 MG tablet Take  1-2 tablets by mouth every 8 (eight) hours as needed. 05/03/22   Maja Mccaffery, Corene Cornea, MD  PARoxetine (PAXIL) 40 MG tablet Take 1 tablet (40 mg total) by mouth every morning. 12/10/21   Libby Maw, MD  QUEtiapine (SEROQUEL) 25 MG tablet TAKE 1 TABLET BY MOUTH EVERYDAY AT BEDTIME 03/21/22   Libby Maw, MD  traMADol Veatrice Bourbon) 50 MG tablet May take one at night as needed for pain. Patient not taking: Reported on 03/31/2022 02/27/22   Libby Maw, MD      Allergies    Hydromorphone    Review of Systems   Review of Systems  Physical Exam Updated Vital Signs BP (!) 132/93 (BP Location: Right Arm)   Pulse 82   Temp 98.2 F (36.8 C) (Oral)   Resp 18   Ht '6\' 2"'$  (1.88 m)   Wt 103.4 kg   SpO2 99%   BMI 29.27 kg/m  Physical Exam Vitals and nursing note reviewed.  Constitutional:      Appearance: Alan Sanders is well-developed.  HENT:     Head: Normocephalic and atraumatic.     Mouth/Throat:     Mouth: Mucous membranes are moist.     Pharynx: Oropharynx is clear.  Eyes:     Pupils: Pupils are equal, round, and reactive to light.  Cardiovascular:     Rate and Rhythm: Normal rate.  Pulmonary:  Effort: Pulmonary effort is normal. No respiratory distress.  Abdominal:     General: Abdomen is flat. There is no distension.  Musculoskeletal:        General: No swelling, tenderness or deformity. Normal range of motion.     Cervical back: Normal range of motion.  Skin:    General: Skin is warm and dry.     Comments: Abrasion over right knee  Neurological:     General: No focal deficit present.     Mental Status: Alan Sanders is alert.     ED Results / Procedures / Treatments   Labs (all labs ordered are listed, but only abnormal results are displayed) Labs Reviewed - No data to display  EKG None  Radiology DG Knee 2 Views Right  Result Date: 05/03/2022 CLINICAL DATA:  Fall, right knee pain EXAM: RIGHT KNEE - 1-2 VIEW COMPARISON:  None Available. FINDINGS: No evidence of  fracture, dislocation, or joint effusion. No evidence of arthropathy or other focal bone abnormality. Soft tissues are unremarkable. IMPRESSION: Negative. Electronically Signed   By: Fidela Salisbury M.D.   On: 05/03/2022 23:06   DG Hip Unilat W or Wo Pelvis 2-3 Views Right  Result Date: 05/03/2022 CLINICAL DATA:  Fall, pelvic pain, right hip pain EXAM: DG HIP (WITH OR WITHOUT PELVIS) 2-3V RIGHT COMPARISON:  None Available. FINDINGS: There is no evidence of hip fracture or dislocation. There is no evidence of arthropathy or other focal bone abnormality. Right inguinal hernia repair with mesh has been performed. IMPRESSION: Negative. Electronically Signed   By: Fidela Salisbury M.D.   On: 05/03/2022 23:06   DG Shoulder 1 View Right  Result Date: 05/03/2022 CLINICAL DATA:  Fall, right shoulder pain EXAM: RIGHT SHOULDER - 1 VIEW COMPARISON:  None Available. FINDINGS: Single view radiograph the right shoulder demonstrates normal alignment on this limited examination. No definite fracture or dislocation. Acromioclavicular joint space is preserved. Glenohumeral joint space is not well profiled. Limited evaluation of the right hemithorax is unremarkable. IMPRESSION: Limited, but unremarkable examination Electronically Signed   By: Fidela Salisbury M.D.   On: 05/03/2022 23:06   CT Cervical Spine Wo Contrast  Result Date: 05/03/2022 CLINICAL DATA:  Fall, neck pain EXAM: CT CERVICAL SPINE WITHOUT CONTRAST TECHNIQUE: Multidetector CT imaging of the cervical spine was performed without intravenous contrast. Multiplanar CT image reconstructions were also generated. RADIATION DOSE REDUCTION: This exam was performed according to the departmental dose-optimization program which includes automated exposure control, adjustment of the mA and/or kV according to patient size and/or use of iterative reconstruction technique. COMPARISON:  None Available. FINDINGS: Alignment: Normal Skull base and vertebrae: No acute fracture. No  primary bone lesion or focal pathologic process. Soft tissues and spinal canal: No prevertebral fluid or swelling. No visible canal hematoma. Disc levels: Prior anterior fusion at C3-4. Disc space narrowing and anterior spurring at C4-5 and C5-6. Upper chest: No acute findings Other: None IMPRESSION: No acute bony abnormality. Prior ACDF C3-4. Electronically Signed   By: Rolm Baptise M.D.   On: 05/03/2022 22:57    Procedures Procedures    Medications Ordered in ED Medications  oxyCODONE-acetaminophen (PERCOCET/ROXICET) 5-325 MG per tablet 2 tablet (has no administration in time range)  ketorolac (TORADOL) injection 15 mg (has no administration in time range)    ED Course/ Medical Decision Making/ A&P                           Medical Decision Making  Amount and/or Complexity of Data Reviewed Radiology: ordered.  Risk Prescription drug management.   Soft tissue injiuries without obvious bony abnormalities. Supportive care at home.   Final Clinical Impression(s) / ED Diagnoses Final diagnoses:  Fall, initial encounter    Rx / DC Orders ED Discharge Orders          Ordered    oxyCODONE-acetaminophen (PERCOCET) 5-325 MG tablet  Every 8 hours PRN,   Status:  Discontinued        05/03/22 2323    meloxicam (MOBIC) 7.5 MG tablet  Daily        05/03/22 2323    oxyCODONE-acetaminophen (PERCOCET) 5-325 MG tablet  Every 8 hours PRN        05/03/22 2326              Isaac Lacson, Corene Cornea, MD 05/03/22 2339

## 2022-05-05 ENCOUNTER — Encounter: Payer: 59 | Admitting: Genetic Counselor

## 2022-05-05 ENCOUNTER — Other Ambulatory Visit: Payer: 59

## 2022-05-08 ENCOUNTER — Other Ambulatory Visit: Payer: Self-pay

## 2022-05-08 ENCOUNTER — Encounter: Payer: Self-pay | Admitting: Genetic Counselor

## 2022-05-08 ENCOUNTER — Inpatient Hospital Stay: Payer: 59 | Attending: Family Medicine | Admitting: Genetic Counselor

## 2022-05-08 ENCOUNTER — Inpatient Hospital Stay: Payer: 59

## 2022-05-08 DIAGNOSIS — Z8371 Family history of colonic polyps: Secondary | ICD-10-CM

## 2022-05-08 DIAGNOSIS — Z860100 Personal history of colon polyps, unspecified: Secondary | ICD-10-CM

## 2022-05-08 DIAGNOSIS — Z83719 Family history of colon polyps, unspecified: Secondary | ICD-10-CM

## 2022-05-08 HISTORY — DX: Personal history of colonic polyps: Z86.010

## 2022-05-08 HISTORY — DX: Family history of colonic polyps: Z83.71

## 2022-05-08 HISTORY — DX: Family history of colon polyps, unspecified: Z83.719

## 2022-05-08 HISTORY — DX: Personal history of colon polyps, unspecified: Z86.0100

## 2022-05-08 LAB — GENETIC SCREENING ORDER

## 2022-05-08 NOTE — Progress Notes (Signed)
REFERRING PROVIDER: Daryel November, MD [address] [place],  [place] [address]  PRIMARY PROVIDER:  Libby Maw, MD  PRIMARY REASON FOR VISIT:  1. History of colonic polyps   2. Family history of colonic polyps     HISTORY OF PRESENT ILLNESS:   Alan Sanders, a 51 y.o. male, was seen for a [facility] cancer genetics consultation at the request of Dr. Candis Schatz due to a personal and family history of colon polyps.  Alan Sanders presents to clinic today to discuss the possibility of a hereditary predisposition to cancer, to discuss genetic testing, and to further clarify his future cancer risks, as well as potential cancer risks for family members.   Alan Sanders is a 51 y.o. male with no personal history of cancer.  He had his first colonoscopy in May 2023, which showed 12 tubular adenomas, 1 tubulovillous adenoma, and one goblet cell-rich hyperplastic polyp.    CANCER HISTORY:  Oncology History   No history exists.    RISK FACTORS:  Colonoscopy: yes;  see history noted above . PSA screening: not reported Annual skin checks: no    Past Medical History:  Diagnosis Date   Anxiety    Arthritis    " in my back "   Asthma    Balance problem    Family history of colonic polyps 05/08/2022   Gait difficulty    GERD (gastroesophageal reflux disease)    Headache    migraines   History of colonic polyps 05/08/2022   Inguinal hernia of left side without obstruction or gangrene    Pneumonia    Seizures ([place])    " its been along time ,since my last seizure "   Spleen enlarged    Thrombocytopenia ([place])    Tobacco abuse     Past Surgical History:  Procedure Laterality Date   ANTERIOR CERVICAL DECOMP/DISCECTOMY FUSION N/A 04/16/2021   Procedure: Cervical three-four  Anterior cervical decompression/discectomy/fusion;  Surgeon: Karsten Ro, DO;  Location: [facility];  Service: Neurosurgery;  Laterality: N/A;   HERNIA REPAIR     inguinal   SURAL NERVE BX Right 12/11/2020    Procedure: SURAL NERVE BIOPSY;  Surgeon: Karsten Ro, DO;  Location: [facility];  Service: Neurosurgery;  Laterality: Right;    FAMILY HISTORY:  We obtained a detailed, 4-generation family history.  Significant diagnoses are listed below: Family History  Problem Relation Age of Onset   Colon polyps Father        unknown number    Alan Sanders is unaware of previous family history of genetic testing for hereditary cancer risks. There is no reported Ashkenazi Jewish ancestry. There is no known consanguinity.  GENETIC COUNSELING ASSESSMENT: Alan Sanders is a 51 y.o. male with a personal history of colon polyps which is somewhat suggestive of a hereditary polyposis syndrome and predisposition to cancer given his lifetime number of colon polyps. We, therefore, discussed and recommended the following at today's visit.   DISCUSSION: DISCUSSION: We discussed that polyps in general are common, however, most people have fewer than 5 lifetime polyps.  When an individual has 10 or more polyps we become concerned about an underlying polyposis syndrome.  The most common hereditary polyposis syndromes are Familial Adenomatous Polyposis (FAP), caused by mutations in the APC gene, and MUTYH-Associated Polyposis (MAP), caused by mutations in the MUTYH gene.  There are other genes that are associated with polyposis.  We discussed that testing is beneficial for several reasons, including knowing about  cancer risks, identifying potential screening and risk-reduction options that may be appropriate, and to understand if other family members could be at risk for colon polyps and/or cancer and allow them to undergo genetic testing.   We reviewed the characteristics, features and inheritance patterns of hereditary cancer syndromes. We also discussed genetic testing, including the appropriate family members to test, the process of testing, insurance coverage and turn-around-time for results. We discussed the implications of a  negative, positive and/or variant of uncertain significant result. We recommended Alan Sanders pursue genetic testing for the [facility] CancerNext-Expanded +RNAinsight Panel.  The CancerNext-Expanded gene panel offered by [facility] and includes sequencing, rearrangement, and RNA analysis for the following 77 genes: AIP, ALK, APC, ATM, AXIN2, BAP1, BARD1, BLM, BMPR1A, BRCA1, BRCA2, BRIP1, CDC73, CDH1, CDK4, CDKN1B, CDKN2A, CHEK2, CTNNA1, DICER1, FANCC, FH, FLCN, GALNT12, KIF1B, LZTR1, MAX, MEN1, MET, MLH1, MSH2, MSH3, MSH6, MUTYH, NBN, NF1, NF2, NTHL1, PALB2, PHOX2B, PMS2, POT1, PRKAR1A, PTCH1, PTEN, RAD51C, RAD51D, RB1, RECQL, RET, SDHA, SDHAF2, SDHB, SDHC, SDHD, SMAD4, SMARCA4, SMARCB1, SMARCE1, STK11, SUFU, TMEM127, TP53, TSC1, TSC2, VHL and XRCC2 (sequencing and deletion/duplication); EGFR, EGLN1, HOXB13, KIT, MITF, PDGFRA, POLD1, and POLE (sequencing only); EPCAM and GREM1 (deletion/duplication only).   Based on Alan Sanders personal history of tubular adenomas, he meets medical criteria for genetic testing. Despite that he meets criteria, he may still have an out of pocket cost. We discussed that if his out of pocket cost for testing is over $100, the laboratory should contact him to discuss self-pay options and/or patient pay assistance programs.   We discussed that some people do not want to undergo genetic testing due to fear of genetic discrimination.  A federal law called the Genetic Information Non-Discrimination Act (GINA) of 2008 helps protect individuals against genetic discrimination based on their genetic test results.  It impacts both health insurance and employment.  With health insurance, it protects against increased premiums, being kicked off insurance or being forced to take a test in order to be insured.  For employment it protects against hiring, firing and promoting decisions based on genetic test results.  GINA does not apply to those in the [employer], those who work for companies  with less than 15 employees, and new life insurance or long-term disability insurance policies.  Health status due to a cancer diagnosis is not protected under GINA.  PLAN: After considering the risks, benefits, and limitations, Alan Sanders provided informed consent to pursue genetic testing and the blood sample was sent to [facility] for analysis of the CancerNext-Expanded +RNAinsight. Results should be available within approximately 3 weeks.  Alan Sanders requested his wife, Alan Sanders, be contacted with results as he works the night shift and is usually sleeping during the day.  Susan's phone number is [phone]. Alan Sanders will receive a summary of his genetic counseling visit and a copy of his results once available. This information will also be available in Epic.    Alan Sanders questions were answered to his satisfaction today. Our contact information was provided should additional questions or concerns arise. Thank you for the referral and allowing [place] to share in the care of your patient.   [place] M. Joette Catching, [place], [facility] Genetic Counselor [email or web address] (P) [phone]   The patient was seen for a total of 35 minutes in face-to-face genetic counseling.  Patient was accompanied by his wife, Alan Sanders. Drs. Lindi Adie and/or Burr Medico were available to discuss this case as needed.  _______________________________________________________________________ For Office Staff:  Number of people involved  in session: 2 Was an Intern/ student involved with case: no

## 2022-05-19 ENCOUNTER — Encounter: Payer: Self-pay | Admitting: Genetic Counselor

## 2022-05-19 ENCOUNTER — Telehealth: Payer: Self-pay | Admitting: Genetic Counselor

## 2022-05-19 ENCOUNTER — Ambulatory Visit: Payer: Self-pay | Admitting: Genetic Counselor

## 2022-05-19 DIAGNOSIS — Z1379 Encounter for other screening for genetic and chromosomal anomalies: Secondary | ICD-10-CM

## 2022-05-19 DIAGNOSIS — Z86010 Personal history of colonic polyps: Secondary | ICD-10-CM

## 2022-05-19 DIAGNOSIS — Z8371 Family history of colonic polyps: Secondary | ICD-10-CM

## 2022-05-20 ENCOUNTER — Other Ambulatory Visit: Payer: Self-pay | Admitting: Family Medicine

## 2022-05-21 ENCOUNTER — Other Ambulatory Visit: Payer: Self-pay | Admitting: Family Medicine

## 2022-05-21 ENCOUNTER — Other Ambulatory Visit (HOSPITAL_COMMUNITY): Payer: Self-pay

## 2022-05-22 ENCOUNTER — Ambulatory Visit: Payer: 59 | Admitting: Family Medicine

## 2022-05-22 ENCOUNTER — Other Ambulatory Visit (HOSPITAL_COMMUNITY): Payer: Self-pay

## 2022-05-22 ENCOUNTER — Telehealth: Payer: Self-pay | Admitting: Family Medicine

## 2022-05-22 MED ORDER — TIZANIDINE HCL 4 MG PO TABS
ORAL_TABLET | ORAL | 2 refills | Status: DC
Start: 2022-05-22 — End: 2023-02-14
  Filled 2022-05-22: qty 90, 30d supply, fill #0
  Filled 2022-07-19: qty 90, 30d supply, fill #1
  Filled 2022-08-14: qty 90, 30d supply, fill #2

## 2022-05-22 NOTE — Telephone Encounter (Signed)
Pt was a no show for his OV  with Ethelene Hal on 05/22/22, I sent a no show letter.

## 2022-05-22 NOTE — Telephone Encounter (Signed)
Pt has appointment with provider today, all refills will be discuss at time of appointment.

## 2022-05-22 NOTE — Telephone Encounter (Signed)
Pt has appointment today with provider, refill will be discuss at time of appointment.

## 2022-05-29 ENCOUNTER — Encounter: Payer: 59 | Admitting: Family Medicine

## 2022-06-02 ENCOUNTER — Other Ambulatory Visit (HOSPITAL_COMMUNITY): Payer: Self-pay

## 2022-06-03 ENCOUNTER — Other Ambulatory Visit (HOSPITAL_BASED_OUTPATIENT_CLINIC_OR_DEPARTMENT_OTHER): Payer: Self-pay

## 2022-06-04 ENCOUNTER — Other Ambulatory Visit (HOSPITAL_COMMUNITY): Payer: Self-pay | Admitting: Neurological Surgery

## 2022-06-04 ENCOUNTER — Other Ambulatory Visit (HOSPITAL_COMMUNITY): Payer: Self-pay

## 2022-06-04 ENCOUNTER — Other Ambulatory Visit: Payer: Self-pay | Admitting: Neurological Surgery

## 2022-06-04 DIAGNOSIS — M4802 Spinal stenosis, cervical region: Secondary | ICD-10-CM | POA: Diagnosis not present

## 2022-06-04 DIAGNOSIS — Z6829 Body mass index (BMI) 29.0-29.9, adult: Secondary | ICD-10-CM | POA: Diagnosis not present

## 2022-06-04 MED ORDER — DIAZEPAM 5 MG PO TABS
ORAL_TABLET | ORAL | 0 refills | Status: DC
Start: 2022-06-04 — End: 2022-06-23
  Filled 2022-06-04: qty 1, 1d supply, fill #0

## 2022-06-06 ENCOUNTER — Other Ambulatory Visit (HOSPITAL_COMMUNITY): Payer: Self-pay

## 2022-06-08 ENCOUNTER — Encounter (HOSPITAL_BASED_OUTPATIENT_CLINIC_OR_DEPARTMENT_OTHER): Payer: Self-pay | Admitting: Emergency Medicine

## 2022-06-08 ENCOUNTER — Emergency Department (HOSPITAL_BASED_OUTPATIENT_CLINIC_OR_DEPARTMENT_OTHER): Payer: 59

## 2022-06-08 ENCOUNTER — Other Ambulatory Visit: Payer: Self-pay

## 2022-06-08 ENCOUNTER — Emergency Department (HOSPITAL_BASED_OUTPATIENT_CLINIC_OR_DEPARTMENT_OTHER)
Admission: EM | Admit: 2022-06-08 | Discharge: 2022-06-08 | Disposition: A | Discharge status: Home / Self Care | Payer: 59 | Attending: Emergency Medicine | Admitting: Emergency Medicine

## 2022-06-08 DIAGNOSIS — S199XXA Unspecified injury of neck, initial encounter: Secondary | ICD-10-CM | POA: Diagnosis not present

## 2022-06-08 DIAGNOSIS — W19XXXA Unspecified fall, initial encounter: Secondary | ICD-10-CM

## 2022-06-08 DIAGNOSIS — S060X0A Concussion without loss of consciousness, initial encounter: Secondary | ICD-10-CM | POA: Diagnosis not present

## 2022-06-08 DIAGNOSIS — S161XXA Strain of muscle, fascia and tendon at neck level, initial encounter: Secondary | ICD-10-CM | POA: Insufficient documentation

## 2022-06-08 DIAGNOSIS — W06XXXA Fall from bed, initial encounter: Secondary | ICD-10-CM | POA: Diagnosis not present

## 2022-06-08 DIAGNOSIS — S060XAA Concussion with loss of consciousness status unknown, initial encounter: Secondary | ICD-10-CM | POA: Diagnosis not present

## 2022-06-08 DIAGNOSIS — S0990XA Unspecified injury of head, initial encounter: Secondary | ICD-10-CM | POA: Diagnosis not present

## 2022-06-08 DIAGNOSIS — M47812 Spondylosis without myelopathy or radiculopathy, cervical region: Secondary | ICD-10-CM | POA: Diagnosis not present

## 2022-06-08 MED ORDER — HYDROCODONE-ACETAMINOPHEN 5-325 MG PO TABS
1.0000 | ORAL_TABLET | Freq: Once | ORAL | Status: AC
  Administered 2022-06-08: 1 via ORAL
  Filled 2022-06-08: qty 1

## 2022-06-08 MED ORDER — ONDANSETRON 4 MG PO TBDP
8.0000 mg | ORAL_TABLET | Freq: Once | ORAL | Status: AC
  Administered 2022-06-08: 8 mg via ORAL
  Filled 2022-06-08: qty 2

## 2022-06-08 NOTE — ED Triage Notes (Signed)
Pt states he rolled out of bed onto floor hitting his head on something wooden. He is unsure of LOC, states he didn't know he rolled out of bed until his wife woke him. Reports having had neck surgery a year ago in May. Pt A&O and able to provide his own history. Reports HA. Skin intact, no bruising.

## 2022-06-08 NOTE — ED Provider Notes (Signed)
[facility] EMERGENCY DEPARTMENT Provider Note   CSN: [id number] Arrival date & time: 06/08/22  0404     History  Chief Complaint  Patient presents with   Alan Sanders is a 51 y.o. male.  The history is provided by the patient.  Fall Associated symptoms include headaches.    Patient reports he rolled out of bed striking his head and the back of his neck.  This occurred just prior to arrival.  He reports last thing he knows that he has had that he woke up on the floor.  He had been at his baseline prior to the fall.  He is not on anticoagulation.  Home Medications Prior to Admission medications   Medication Sig Start Date End Date Taking? Authorizing Provider  acetaminophen (TYLENOL) 325 MG tablet Take 650 mg by mouth every 6 (six) hours as needed.    [provider]  albuterol (VENTOLIN HFA) 108 (90 Base) MCG/ACT inhaler Inhale 1-2 puffs into the lungs every 6 (six) hours as needed for wheezing or shortness of breath. 12/10/21   Libby Maw, MD  diazepam (VALIUM) 5 MG tablet Take 1 tablet by mouth 30 minutes before MRI. 06/04/22     gabapentin (NEURONTIN) 300 MG capsule Take 1 capsule by mouth 3 times daily. Patient not taking: Reported on 03/31/2022 02/27/22   Libby Maw, MD  lidocaine (LIDODERM) 5 % Place 1 patch onto the skin daily. Remove & Discard patch within 12 hours or as directed by MD 02/16/22   Hendricks Limes, PA-C  loperamide (IMODIUM) 2 MG capsule Take by mouth. 03/17/22   [provider]  methocarbamol (ROBAXIN) 750 MG tablet Take 1 tablet by mouth 3 times every day as needed for spasms 02/27/22   Libby Maw, MD  methocarbamol (ROBAXIN) 750 MG tablet Take 1 tablet by mouth three times daily for muscle spasms. 04/23/22   Dawley, Troy C, DO  PARoxetine (PAXIL) 40 MG tablet Take 1 tablet (40 mg total) by mouth every morning. 12/10/21   Libby Maw, MD  QUEtiapine (SEROQUEL) 25 MG tablet TAKE 1  TABLET BY MOUTH EVERYDAY AT BEDTIME 03/21/22   Libby Maw, MD  tiZANidine (ZANAFLEX) 4 MG tablet Take 1 tablet by mouth every 6 - 8 hours as needed **Do not exceed 3 doses in 24 hours 05/22/22     traMADol (ULTRAM) 50 MG tablet May take one at night as needed for pain. Patient not taking: Reported on 03/31/2022 02/27/22   Libby Maw, MD      Allergies    Hydromorphone    Review of Systems   Review of Systems  Musculoskeletal:  Positive for neck pain.  Neurological:  Positive for headaches.    Physical Exam Updated Vital Signs BP 118/78   Pulse 77   Temp 97.8 F (36.6 C) (Oral)   Resp 18   Ht 1.854 m ('6\' 1"'$ )   Wt 103 kg   SpO2 98%   BMI 29.95 kg/m  Physical Exam CONSTITUTIONAL: Well developed/well nourished, uncomfortable appearing HEAD: Normocephalic/atraumatic, no lacerations, no bruising or crepitus EYES: EOMI/PERRL ENMT: Mucous membranes moist SPINE/BACK: Cervical spine tenderness noted No bruising/crepitance/stepoffs noted to spine Reports mid thoracic and lumbar tenderness are chronic NEURO: Pt is resting with eyes closed, then opens his eyes and follows commands.  Otherwise acting appropriately.  Moves all extremities x4 EXTREMITIES: pulses normal/equal, full ROM, no visible trauma SKIN: warm, color normal  ED Results /  Procedures / Treatments   Labs (all labs ordered are listed, but only abnormal results are displayed) Labs Reviewed - No data to display  EKG None  Radiology CT Head Wo Contrast  Result Date: 06/08/2022 CLINICAL DATA:  51 year old male with history of head trauma after rolling out of bed. EXAM: CT HEAD WITHOUT CONTRAST CT CERVICAL SPINE WITHOUT CONTRAST TECHNIQUE: Multidetector CT imaging of the head and cervical spine was performed following the standard protocol without intravenous contrast. Multiplanar CT image reconstructions of the cervical spine were also generated. RADIATION DOSE REDUCTION: This exam was performed  according to the departmental dose-optimization program which includes automated exposure control, adjustment of the mA and/or kV according to patient size and/or use of iterative reconstruction technique. COMPARISON:  C-spine CT 05/03/2022.  Head CT 08/03/2021. FINDINGS: CT HEAD FINDINGS Brain: No evidence of acute infarction, hemorrhage, hydrocephalus, extra-axial collection or mass lesion/mass effect. Vascular: No hyperdense vessel or unexpected calcification. Skull: Normal. Negative for fracture or focal lesion. Sinuses/Orbits: No acute finding. Other: None. CT CERVICAL SPINE FINDINGS Alignment: Normal. Skull base and vertebrae: Status post ACDF at C3-C4 with interbody cage at this level. No acute fracture. No primary bone lesion or focal pathologic process. Soft tissues and spinal canal: No prevertebral fluid or swelling. No visible canal hematoma. Disc levels: Multilevel degenerative disc disease, most evident at C4-C5, C5-C6 and C6-C7. Mild multilevel facet arthropathy. Upper chest: Unremarkable. Other: None. IMPRESSION: 1. No evidence of significant acute traumatic injury to the skull, brain or cervical spine. 2. The appearance of the brain is normal. 3. Multilevel degenerative disc disease and cervical spondylosis with postoperative changes of ACDF at C3-C4, similar to prior studies. Electronically Signed   By: Vinnie Langton M.D.   On: 06/08/2022 05:08   CT Cervical Spine Wo Contrast  Result Date: 06/08/2022 CLINICAL DATA:  51 year old male with history of head trauma after rolling out of bed. EXAM: CT HEAD WITHOUT CONTRAST CT CERVICAL SPINE WITHOUT CONTRAST TECHNIQUE: Multidetector CT imaging of the head and cervical spine was performed following the standard protocol without intravenous contrast. Multiplanar CT image reconstructions of the cervical spine were also generated. RADIATION DOSE REDUCTION: This exam was performed according to the departmental dose-optimization program which includes  automated exposure control, adjustment of the mA and/or kV according to patient size and/or use of iterative reconstruction technique. COMPARISON:  C-spine CT 05/03/2022.  Head CT 08/03/2021. FINDINGS: CT HEAD FINDINGS Brain: No evidence of acute infarction, hemorrhage, hydrocephalus, extra-axial collection or mass lesion/mass effect. Vascular: No hyperdense vessel or unexpected calcification. Skull: Normal. Negative for fracture or focal lesion. Sinuses/Orbits: No acute finding. Other: None. CT CERVICAL SPINE FINDINGS Alignment: Normal. Skull base and vertebrae: Status post ACDF at C3-C4 with interbody cage at this level. No acute fracture. No primary bone lesion or focal pathologic process. Soft tissues and spinal canal: No prevertebral fluid or swelling. No visible canal hematoma. Disc levels: Multilevel degenerative disc disease, most evident at C4-C5, C5-C6 and C6-C7. Mild multilevel facet arthropathy. Upper chest: Unremarkable. Other: None. IMPRESSION: 1. No evidence of significant acute traumatic injury to the skull, brain or cervical spine. 2. The appearance of the brain is normal. 3. Multilevel degenerative disc disease and cervical spondylosis with postoperative changes of ACDF at C3-C4, similar to prior studies. Electronically Signed   By: Vinnie Langton M.D.   On: 06/08/2022 05:08    Procedures Procedures    Medications Ordered in ED Medications  ondansetron (ZOFRAN-ODT) disintegrating tablet 8 mg (8 mg Oral Given 06/08/22 0424)  HYDROcodone-acetaminophen (NORCO/VICODIN) 5-325 MG per tablet 1 tablet (1 tablet Oral Given 06/08/22 0424)    ED Course/ Medical Decision Making/ A&P         Glasgow Coma Scale Score: 14      NEXUS Criteria Score: 1            Medical Decision Making Amount and/or Complexity of Data Reviewed Radiology: ordered.  Risk Prescription drug management.   Patient presents after accidental fall out of bed.  He had headache and posterior neck pain. Initial GCS  14 due to midline tenderness, CT imaging was ordered. I have personally visualized imaging there is no acute findings.  Patient now feels improved.  He has been ambulatory.  He will be discharged.  No other signs of acute traumatic injury        Final Clinical Impression(s) / ED Diagnoses Final diagnoses:  Fall, initial encounter  Concussion with unknown loss of consciousness status, initial encounter  Strain of neck muscle, initial encounter    Rx / DC Orders ED Discharge Orders     None         Ripley Fraise, MD 06/08/22 Delrae Rend

## 2022-06-14 ENCOUNTER — Other Ambulatory Visit (HOSPITAL_COMMUNITY): Payer: Self-pay

## 2022-06-17 ENCOUNTER — Other Ambulatory Visit (HOSPITAL_COMMUNITY): Payer: Self-pay

## 2022-06-17 ENCOUNTER — Other Ambulatory Visit: Payer: Self-pay | Admitting: Family Medicine

## 2022-06-17 DIAGNOSIS — S39012D Strain of muscle, fascia and tendon of lower back, subsequent encounter: Secondary | ICD-10-CM

## 2022-06-18 ENCOUNTER — Other Ambulatory Visit (HOSPITAL_COMMUNITY): Payer: Self-pay

## 2022-06-21 ENCOUNTER — Ambulatory Visit (HOSPITAL_COMMUNITY)
Admission: RE | Admit: 2022-06-21 | Discharge: 2022-06-21 | Disposition: A | Discharge status: Home / Self Care | Payer: 59 | Source: Ambulatory Visit | Attending: Neurological Surgery | Admitting: Neurological Surgery

## 2022-06-21 DIAGNOSIS — M4802 Spinal stenosis, cervical region: Secondary | ICD-10-CM | POA: Diagnosis not present

## 2022-06-21 DIAGNOSIS — M47812 Spondylosis without myelopathy or radiculopathy, cervical region: Secondary | ICD-10-CM | POA: Diagnosis not present

## 2022-06-21 DIAGNOSIS — M50223 Other cervical disc displacement at C6-C7 level: Secondary | ICD-10-CM | POA: Diagnosis not present

## 2022-06-21 DIAGNOSIS — M25511 Pain in right shoulder: Secondary | ICD-10-CM | POA: Diagnosis not present

## 2022-06-22 MED ORDER — TRAMADOL HCL 50 MG PO TABS: ORAL_TABLET | ORAL | 0 refills | Status: DC

## 2022-06-23 ENCOUNTER — Other Ambulatory Visit (HOSPITAL_COMMUNITY): Payer: Self-pay

## 2022-06-23 DIAGNOSIS — Z6829 Body mass index (BMI) 29.0-29.9, adult: Secondary | ICD-10-CM | POA: Diagnosis not present

## 2022-06-23 DIAGNOSIS — M4802 Spinal stenosis, cervical region: Secondary | ICD-10-CM | POA: Diagnosis not present

## 2022-06-23 MED ORDER — DIAZEPAM 5 MG PO TABS
ORAL_TABLET | ORAL | 0 refills | Status: DC
  Filled 2022-06-23: qty 2, 1d supply, fill #0

## 2022-06-24 ENCOUNTER — Other Ambulatory Visit (HOSPITAL_COMMUNITY): Payer: Self-pay

## 2022-06-24 DIAGNOSIS — G8929 Other chronic pain: Secondary | ICD-10-CM | POA: Diagnosis not present

## 2022-06-24 DIAGNOSIS — T63481A Toxic effect of venom of other arthropod, accidental (unintentional), initial encounter: Secondary | ICD-10-CM | POA: Diagnosis not present

## 2022-06-24 DIAGNOSIS — T63461A Toxic effect of venom of wasps, accidental (unintentional), initial encounter: Secondary | ICD-10-CM | POA: Diagnosis not present

## 2022-06-24 DIAGNOSIS — M549 Dorsalgia, unspecified: Secondary | ICD-10-CM | POA: Diagnosis not present

## 2022-06-24 DIAGNOSIS — R079 Chest pain, unspecified: Secondary | ICD-10-CM | POA: Diagnosis not present

## 2022-06-24 DIAGNOSIS — R0602 Shortness of breath: Secondary | ICD-10-CM | POA: Diagnosis not present

## 2022-06-25 DIAGNOSIS — M549 Dorsalgia, unspecified: Secondary | ICD-10-CM | POA: Diagnosis not present

## 2022-06-25 DIAGNOSIS — R079 Chest pain, unspecified: Secondary | ICD-10-CM | POA: Diagnosis not present

## 2022-06-25 DIAGNOSIS — T63461A Toxic effect of venom of wasps, accidental (unintentional), initial encounter: Secondary | ICD-10-CM | POA: Diagnosis not present

## 2022-06-25 DIAGNOSIS — R0602 Shortness of breath: Secondary | ICD-10-CM | POA: Diagnosis not present

## 2022-06-25 DIAGNOSIS — G8929 Other chronic pain: Secondary | ICD-10-CM | POA: Diagnosis not present

## 2022-07-04 ENCOUNTER — Other Ambulatory Visit: Payer: Self-pay

## 2022-07-04 ENCOUNTER — Encounter (HOSPITAL_BASED_OUTPATIENT_CLINIC_OR_DEPARTMENT_OTHER): Payer: Self-pay

## 2022-07-04 ENCOUNTER — Emergency Department (HOSPITAL_BASED_OUTPATIENT_CLINIC_OR_DEPARTMENT_OTHER)
Admission: EM | Admit: 2022-07-04 | Discharge: 2022-07-04 | Disposition: A | Discharge status: Home / Self Care | Payer: 59 | Attending: Emergency Medicine | Admitting: Emergency Medicine

## 2022-07-04 DIAGNOSIS — R197 Diarrhea, unspecified: Secondary | ICD-10-CM | POA: Diagnosis not present

## 2022-07-04 DIAGNOSIS — R519 Headache, unspecified: Secondary | ICD-10-CM | POA: Diagnosis not present

## 2022-07-04 DIAGNOSIS — Z20822 Contact with and (suspected) exposure to covid-19: Secondary | ICD-10-CM | POA: Insufficient documentation

## 2022-07-04 DIAGNOSIS — R1110 Vomiting, unspecified: Secondary | ICD-10-CM | POA: Diagnosis not present

## 2022-07-04 DIAGNOSIS — B349 Viral infection, unspecified: Secondary | ICD-10-CM | POA: Diagnosis not present

## 2022-07-04 DIAGNOSIS — M791 Myalgia, unspecified site: Secondary | ICD-10-CM | POA: Diagnosis not present

## 2022-07-04 DIAGNOSIS — E860 Dehydration: Secondary | ICD-10-CM | POA: Diagnosis not present

## 2022-07-04 LAB — CBC WITH DIFFERENTIAL/PLATELET
Abs Immature Granulocytes: 0.03 10*3/uL (ref 0.00–0.07)
Basophils Absolute: 0 10*3/uL (ref 0.0–0.1)
Basophils Relative: 0 %
Eosinophils Absolute: 0.3 10*3/uL (ref 0.0–0.5)
Eosinophils Relative: 3 %
HCT: 47.3 % (ref 39.0–52.0)
Hemoglobin: 16.8 g/dL (ref 13.0–17.0)
Immature Granulocytes: 0 %
Lymphocytes Relative: 14 %
Lymphs Abs: 1.4 10*3/uL (ref 0.7–4.0)
MCH: 28.8 pg (ref 26.0–34.0)
MCHC: 35.5 g/dL (ref 30.0–36.0)
MCV: 81 fL (ref 80.0–100.0)
Monocytes Absolute: 0.6 10*3/uL (ref 0.1–1.0)
Monocytes Relative: 6 %
Neutro Abs: 7.9 10*3/uL — ABNORMAL HIGH (ref 1.7–7.7)
Neutrophils Relative %: 77 %
Platelets: 138 10*3/uL — ABNORMAL LOW (ref 150–400)
RBC: 5.84 MIL/uL — ABNORMAL HIGH (ref 4.22–5.81)
RDW: 13.7 % (ref 11.5–15.5)
WBC: 10.3 10*3/uL (ref 4.0–10.5)
nRBC: 0 % (ref 0.0–0.2)

## 2022-07-04 LAB — URINALYSIS, ROUTINE W REFLEX MICROSCOPIC
Bilirubin Urine: NEGATIVE
Glucose, UA: NEGATIVE mg/dL
Hgb urine dipstick: NEGATIVE
Ketones, ur: NEGATIVE mg/dL
Leukocytes,Ua: NEGATIVE
Nitrite: NEGATIVE
Protein, ur: NEGATIVE mg/dL
Specific Gravity, Urine: 1.02 (ref 1.005–1.030)
pH: 5.5 (ref 5.0–8.0)

## 2022-07-04 LAB — COMPREHENSIVE METABOLIC PANEL
ALT: 69 U/L — ABNORMAL HIGH (ref 0–44)
AST: 39 U/L (ref 15–41)
Albumin: 4.7 g/dL (ref 3.5–5.0)
Alkaline Phosphatase: 86 U/L (ref 38–126)
Anion gap: 9 (ref 5–15)
BUN: 13 mg/dL (ref 6–20)
CO2: 26 mmol/L (ref 22–32)
Calcium: 9.2 mg/dL (ref 8.9–10.3)
Chloride: 100 mmol/L (ref 98–111)
Creatinine, Ser: 0.81 mg/dL (ref 0.61–1.24)
GFR, Estimated: >60 mL/min (ref >60)
Glucose, Bld: 112 mg/dL — ABNORMAL HIGH (ref 70–99)
Potassium: 3.7 mmol/L (ref 3.5–5.1)
Sodium: 135 mmol/L (ref 135–145)
Total Bilirubin: 1.1 mg/dL (ref 0.3–1.2)
Total Protein: 8.1 g/dL (ref 6.5–8.1)

## 2022-07-04 LAB — SARS CORONAVIRUS 2 BY RT PCR: SARS Coronavirus 2 by RT PCR: NEGATIVE

## 2022-07-04 LAB — CK: Total CK: 512 U/L — ABNORMAL HIGH (ref 49–397)

## 2022-07-04 MED ORDER — KETOROLAC TROMETHAMINE 30 MG/ML IJ SOLN
30.0000 mg | Freq: Once | INTRAMUSCULAR | Status: AC
  Administered 2022-07-04: 30 mg via INTRAVENOUS
  Filled 2022-07-04: qty 1

## 2022-07-04 MED ORDER — SODIUM CHLORIDE 0.9 % IV BOLUS
1000.0000 mL | Freq: Once | INTRAVENOUS | Status: AC
  Administered 2022-07-04: 1000 mL via INTRAVENOUS

## 2022-07-04 MED ORDER — DIPHENHYDRAMINE HCL 50 MG/ML IJ SOLN
25.0000 mg | Freq: Once | INTRAMUSCULAR | Status: AC
Start: 2022-07-04 — End: 2022-07-04
  Administered 2022-07-04: 25 mg via INTRAVENOUS
  Filled 2022-07-04: qty 1

## 2022-07-04 MED ORDER — METOCLOPRAMIDE HCL 5 MG/ML IJ SOLN
10.0000 mg | Freq: Once | INTRAMUSCULAR | Status: AC
  Administered 2022-07-04: 10 mg via INTRAVENOUS
  Filled 2022-07-04: qty 2

## 2022-07-04 NOTE — Discharge Instructions (Signed)
You likely have viral syndrome.  You need to stay hydrated and take Tylenol or Motrin for headache or myalgias.  See your doctor for follow-up  Return to ER if you have worse abdominal pain, vomiting, headache, fever.

## 2022-07-04 NOTE — ED Triage Notes (Signed)
Patient c/o body aches/n/v/d and headache x last night.
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
## 2022-07-08 ENCOUNTER — Other Ambulatory Visit (HOSPITAL_COMMUNITY): Payer: Self-pay

## 2022-07-08 DIAGNOSIS — M5412 Radiculopathy, cervical region: Secondary | ICD-10-CM | POA: Diagnosis not present

## 2022-07-10 ENCOUNTER — Other Ambulatory Visit (HOSPITAL_COMMUNITY): Payer: Self-pay

## 2022-07-10 MED ORDER — METHOCARBAMOL 750 MG PO TABS
ORAL_TABLET | ORAL | 1 refills | Status: DC
  Filled 2022-07-10: qty 60, 20d supply, fill #0
  Filled 2022-07-19 – 2022-08-14 (×5): qty 60, 20d supply, fill #1

## 2022-07-11 ENCOUNTER — Other Ambulatory Visit (HOSPITAL_COMMUNITY): Payer: Self-pay

## 2022-07-16 ENCOUNTER — Telehealth: Payer: Self-pay

## 2022-07-16 NOTE — Telephone Encounter (Signed)
Transition Care Management Unsuccessful Follow-up Telephone Call  Date of discharge and from where:  07/04/[facility]  Attempts:  1st Attempt  Reason for unsuccessful TCM follow-up call:  Left voice message

## 2022-07-17 ENCOUNTER — Telehealth: Payer: Self-pay

## 2022-07-17 ENCOUNTER — Other Ambulatory Visit (HOSPITAL_COMMUNITY): Payer: Self-pay

## 2022-07-17 NOTE — Telephone Encounter (Signed)
Transition Care Management Unsuccessful Follow-up Telephone Call  Date of discharge and from where:  07/04/[facility]  Attempts:  2nd Attempt  Reason for unsuccessful TCM follow-up call:  Left voice message

## 2022-07-18 ENCOUNTER — Ambulatory Visit (INDEPENDENT_AMBULATORY_CARE_PROVIDER_SITE_OTHER): Payer: 59 | Admitting: Family Medicine

## 2022-07-18 ENCOUNTER — Other Ambulatory Visit (HOSPITAL_COMMUNITY): Payer: Self-pay

## 2022-07-18 ENCOUNTER — Encounter: Payer: Self-pay | Admitting: Family Medicine

## 2022-07-18 VITALS — BP 136/78 | HR 99 | Temp 97.0°F | Ht 73.0 in | Wt 219.4 lb

## 2022-07-18 DIAGNOSIS — J4520 Mild intermittent asthma, uncomplicated: Secondary | ICD-10-CM | POA: Diagnosis not present

## 2022-07-18 DIAGNOSIS — J069 Acute upper respiratory infection, unspecified: Secondary | ICD-10-CM | POA: Diagnosis not present

## 2022-07-18 DIAGNOSIS — Z720 Tobacco use: Secondary | ICD-10-CM | POA: Diagnosis not present

## 2022-07-18 MED ORDER — ALBUTEROL SULFATE HFA 108 (90 BASE) MCG/ACT IN AERS
1.0000 | INHALATION_SPRAY | Freq: Four times a day (QID) | RESPIRATORY_TRACT | 2 refills | Status: DC | PRN
  Filled 2022-07-18: qty 6.7, 25d supply, fill #0
  Filled 2022-08-14: qty 6.7, 25d supply, fill #1
  Filled 2022-11-20: qty 6.7, 25d supply, fill #2

## 2022-07-18 NOTE — Progress Notes (Signed)
[facility] [address] [place] [address] Dept: [phone] Dept Fax: [phone]  Office Visit  Subjective:    Patient ID: Alan Sanders, male    DOB: August 02, 1971, 51 y.o..   MRN: [id number]  Chief Complaint  Patient presents with   Acute Visit    C/o having HA, achy, body aches, chills x 1 day.  No OTC meds taken.      History of Present Illness:  Patient is in today complaining of an upper respiratory illness. he notes he was sick 2 weeks ago with similar issues. He was seen at [facility] ED and found to have a viral syndrome. After 2 days off, he was able to return to work without issues. However, he notes last night, he had sudden onset of chills, body aches, headache, nausea and some dry heaves. he has not taken any medicine at this point, rahter has been sleeping a great deal. Mr. Armstrong has a history of asthma. He uses his albuterol inh about once a week. He also smokes 1/2-1 ppd of cigarettes.  Past Medical History: Patient Active Problem List   Diagnosis Date Noted   Genetic testing 05/19/2022   History of colonic polyps 05/08/2022   Family history of colonic polyps 05/08/2022   Need for shingles vaccine 02/27/2022   Screen for colon cancer 02/27/2022   Strain of lumbar region 02/27/2022   Peyronie's disease 02/27/2022   Pain in both feet 02/27/2022   Work related injury 12/10/2021   Insomnia secondary to depression with anxiety 05/13/2021   Cervical myelopathy ([place]) 04/16/2021   Seasonal allergic rhinitis due to pollen 02/18/2021   Dysthymia 02/18/2021   Cough 02/13/2015   Thrombocytopenia ([place]) 02/13/2015   Chest pain 02/12/2015   Asthma 02/12/2015   Tobacco abuse 02/12/2015   Nausea vomiting and diarrhea 02/12/2015   Pain in the chest    Past Surgical History:  Procedure Laterality Date   ANTERIOR CERVICAL DECOMP/DISCECTOMY FUSION N/A 04/16/2021   Procedure: Cervical three-four   Anterior cervical decompression/discectomy/fusion;  Surgeon: Karsten Ro, DO;  Location: [facility];  Service: Neurosurgery;  Laterality: N/A;   HERNIA REPAIR     inguinal   SURAL NERVE BX Right 12/11/2020   Procedure: SURAL NERVE BIOPSY;  Surgeon: Karsten Ro, DO;  Location: [facility];  Service: Neurosurgery;  Laterality: Right;   Family History  Problem Relation Age of Onset   Diabetes Mother    Hypertension Mother    COPD Mother    Colon polyps Father        unknown number   Alcoholism Brother    Colon cancer Neg Hx    Esophageal cancer Neg Hx    Stomach cancer Neg Hx    Rectal cancer Neg Hx    Outpatient Medications Prior to Visit  Medication Sig Dispense Refill   acetaminophen (TYLENOL) 325 MG tablet Take 650 mg by mouth every 6 (six) hours as needed.     diazepam (VALIUM) 5 MG tablet Take 1 tablet by mouth 45 minutes prior to procedure and 1 tablet by mouth 20 minutes prior to procedure if needed 2 tablet 0   gabapentin (NEURONTIN) 300 MG capsule Take 1 capsule by mouth 3 times daily. 90 capsule 2   lidocaine (LIDODERM) 5 % Place 1 patch onto the skin daily. Remove & Discard patch within 12 hours or as directed by MD 30 patch 0   loperamide (IMODIUM) 2 MG capsule Take by mouth.  methocarbamol (ROBAXIN) 750 MG tablet Take 1 tablet by mouth 3 times daily as needed. 60 tablet 1   PARoxetine (PAXIL) 40 MG tablet Take 1 tablet (40 mg total) by mouth every morning. 90 tablet 1   QUEtiapine (SEROQUEL) 25 MG tablet TAKE 1 TABLET BY MOUTH EVERYDAY AT BEDTIME 90 tablet 1   tiZANidine (ZANAFLEX) 4 MG tablet Take 1 tablet by mouth every 6 - 8 hours as needed **Do not exceed 3 doses in 24 hours 90 tablet 2   traMADol (ULTRAM) 50 MG tablet May take one at night as needed for pain. 15 tablet 0   albuterol (VENTOLIN HFA) 108 (90 Base) MCG/ACT inhaler Inhale 1-2 puffs into the lungs every 6 (six) hours as needed for wheezing or shortness of breath. 8 g 2   methocarbamol (ROBAXIN) 750 MG tablet  Take 1 tablet by mouth 3 times every day as needed for spasms 90 tablet 2   methocarbamol (ROBAXIN) 750 MG tablet Take 1 tablet by mouth three times a day as needed for muscle spasms. 90 tablet 2   No facility-administered medications prior to visit.   Allergies  Allergen Reactions   Hydromorphone Itching and Rash     Objective:   Today's Vitals   07/18/22 1335  BP: 136/78  Pulse: 99  Temp: (!) 97 F (36.1 C)  TempSrc: Temporal  SpO2: 96%  Weight: 219 lb 6.4 oz (99.5 kg)  Height: '6\' 1"'$  (1.854 m)   Body mass index is 28.95 kg/m.   General: Well developed, well nourished. No acute distress. HEENT: Normocephalic, non-traumatic. PERRL, EOMI. Conjunctiva clear. Fundiscopic   exam shows normal disc and vasculature. External ears normal. EAC and TMs normal   bilaterally. Nose clear without congestion or rhinorrhea. Mucous membranes moist.   Oropharynx clear. Good dentition. Neck: Supple. No lymphadenopathy. No thyromegaly. Lungs: Some coarsening vs. crackles in the left base. No wheezing. CV: RRR without murmurs or rubs. Pulses 2+ bilaterally. Psych: Alert and oriented. Normal mood and affect.  Health Maintenance Due  Topic Date Due   Zoster Vaccines- Shingrix (2 of 2) 04/24/2022   INFLUENZA VACCINE  06/24/2022   Lab Results POCT COVID: Neg.    Assessment & Plan:   1. Viral URI with cough Discussed home care for viral illness, including rest, pushing fluids, and OTC medications as needed for symptom relief. Recommend hot tea with honey for sore throat symptoms. Follow-up if needed for worsening or persistent symptoms. RTW on 07/22/2022.  2. Mild intermittent asthma without complication I will renew his albuterol. I recommend he go ahead and use this TID for 2 days to see if this will improve the respiratory component of his current illness.  - albuterol (VENTOLIN HFA) 108 (90 Base) MCG/ACT inhaler; Inhale 1-2 puffs into the lungs every 6 (six) hours as needed for wheezing  or shortness of breath.  Dispense: 8 g; Refill: 2  3. Tobacco abuse Strongly advise him to stop smoking.  Return if symptoms worsen or fail to improve.   Haydee Salter, MD

## 2022-07-19 ENCOUNTER — Other Ambulatory Visit: Payer: Self-pay | Admitting: Family Medicine

## 2022-07-19 ENCOUNTER — Other Ambulatory Visit (HOSPITAL_COMMUNITY): Payer: Self-pay

## 2022-07-19 DIAGNOSIS — M79671 Pain in right foot: Secondary | ICD-10-CM

## 2022-07-20 MED ORDER — GABAPENTIN 300 MG PO CAPS
300.0000 mg | ORAL_CAPSULE | Freq: Three times a day (TID) | ORAL | 0 refills | Status: DC
  Filled 2022-07-20: qty 90, 30d supply, fill #0

## 2022-07-21 ENCOUNTER — Other Ambulatory Visit (HOSPITAL_COMMUNITY): Payer: Self-pay

## 2022-07-23 ENCOUNTER — Other Ambulatory Visit (HOSPITAL_COMMUNITY): Payer: Self-pay

## 2022-07-26 ENCOUNTER — Other Ambulatory Visit (HOSPITAL_COMMUNITY): Payer: Self-pay

## 2022-07-29 NOTE — Telephone Encounter (Signed)
Transition Care Management Unsuccessful Follow-up Telephone Call  Date of discharge and from where:  07/04/2022 [facility]  Attempts:  3rd Attempt  Reason for unsuccessful TCM follow-up call:  Left voice message   Letter sent to patient via MyChart to call our office.

## 2022-08-04 DIAGNOSIS — M47812 Spondylosis without myelopathy or radiculopathy, cervical region: Secondary | ICD-10-CM | POA: Diagnosis not present

## 2022-08-07 ENCOUNTER — Other Ambulatory Visit (HOSPITAL_COMMUNITY): Payer: Self-pay

## 2022-08-07 ENCOUNTER — Other Ambulatory Visit: Payer: Self-pay | Admitting: Family Medicine

## 2022-08-07 DIAGNOSIS — M79671 Pain in right foot: Secondary | ICD-10-CM

## 2022-08-07 MED ORDER — GABAPENTIN 300 MG PO CAPS
300.0000 mg | ORAL_CAPSULE | Freq: Three times a day (TID) | ORAL | 0 refills | Status: DC
  Filled 2022-08-07 – 2022-08-14 (×2): qty 90, 30d supply, fill #0

## 2022-08-09 ENCOUNTER — Encounter (HOSPITAL_COMMUNITY): Payer: Self-pay | Admitting: Emergency Medicine

## 2022-08-09 ENCOUNTER — Emergency Department (HOSPITAL_COMMUNITY)
Admission: EM | Admit: 2022-08-09 | Discharge: 2022-08-09 | Disposition: A | Discharge status: Home / Self Care | Payer: 59 | Attending: Emergency Medicine | Admitting: Emergency Medicine

## 2022-08-09 ENCOUNTER — Emergency Department (HOSPITAL_COMMUNITY): Payer: 59

## 2022-08-09 ENCOUNTER — Other Ambulatory Visit: Payer: Self-pay

## 2022-08-09 ENCOUNTER — Other Ambulatory Visit (HOSPITAL_COMMUNITY): Payer: Self-pay

## 2022-08-09 DIAGNOSIS — S61451A Open bite of right hand, initial encounter: Secondary | ICD-10-CM | POA: Insufficient documentation

## 2022-08-09 DIAGNOSIS — S6991XA Unspecified injury of right wrist, hand and finger(s), initial encounter: Secondary | ICD-10-CM | POA: Diagnosis present

## 2022-08-09 DIAGNOSIS — W503XXA Accidental bite by another person, initial encounter: Secondary | ICD-10-CM | POA: Diagnosis not present

## 2022-08-09 LAB — HEPATITIS B SURFACE ANTIGEN: Hepatitis B Surface Ag: NONREACTIVE

## 2022-08-09 LAB — RAPID HIV SCREEN (HIV 1/2 AB+AG)
HIV 1/2 Antibodies: NONREACTIVE
HIV-1 P24 Antigen - HIV24: NONREACTIVE

## 2022-08-09 MED ORDER — AMOXICILLIN-POT CLAVULANATE 875-125 MG PO TABS
1.0000 | ORAL_TABLET | Freq: Two times a day (BID) | ORAL | 0 refills | Status: DC
  Filled 2022-08-09: qty 14, 7d supply, fill #0

## 2022-08-09 MED ORDER — BACITRACIN ZINC 500 UNIT/GM EX OINT
TOPICAL_OINTMENT | Freq: Two times a day (BID) | CUTANEOUS | Status: DC
  Filled 2022-08-09: qty 28.4

## 2022-08-09 MED ORDER — AMOXICILLIN-POT CLAVULANATE 875-125 MG PO TABS
1.0000 | ORAL_TABLET | Freq: Once | ORAL | Status: AC
  Administered 2022-08-09: 1 via ORAL
  Filled 2022-08-09: qty 1

## 2022-08-09 MED ORDER — BACITRACIN ZINC 500 UNIT/GM EX OINT
TOPICAL_OINTMENT | CUTANEOUS | Status: AC
  Filled 2022-08-09: qty 0.9

## 2022-08-09 MED ORDER — ACETAMINOPHEN 325 MG PO TABS
650.0000 mg | ORAL_TABLET | Freq: Once | ORAL | Status: AC
  Administered 2022-08-09: 650 mg via ORAL
  Filled 2022-08-09: qty 2
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
## 2022-08-12 LAB — HCV INTERPRETATION

## 2022-08-12 LAB — HCV AB W REFLEX TO QUANT PCR: HCV Ab: NONREACTIVE

## 2022-08-14 ENCOUNTER — Other Ambulatory Visit (HOSPITAL_COMMUNITY): Payer: Self-pay

## 2022-08-15 ENCOUNTER — Other Ambulatory Visit (HOSPITAL_COMMUNITY): Payer: Self-pay

## 2022-08-19 ENCOUNTER — Emergency Department (HOSPITAL_BASED_OUTPATIENT_CLINIC_OR_DEPARTMENT_OTHER)
Admission: EM | Admit: 2022-08-19 | Discharge: 2022-08-20 | Disposition: A | Discharge status: Home / Self Care | Payer: 59 | Attending: Emergency Medicine | Admitting: Emergency Medicine

## 2022-08-19 ENCOUNTER — Encounter (HOSPITAL_BASED_OUTPATIENT_CLINIC_OR_DEPARTMENT_OTHER): Payer: Self-pay

## 2022-08-19 ENCOUNTER — Other Ambulatory Visit: Payer: Self-pay

## 2022-08-19 DIAGNOSIS — R079 Chest pain, unspecified: Secondary | ICD-10-CM | POA: Diagnosis not present

## 2022-08-19 DIAGNOSIS — A084 Viral intestinal infection, unspecified: Secondary | ICD-10-CM | POA: Insufficient documentation

## 2022-08-19 DIAGNOSIS — Z20822 Contact with and (suspected) exposure to covid-19: Secondary | ICD-10-CM | POA: Insufficient documentation

## 2022-08-19 DIAGNOSIS — R519 Headache, unspecified: Secondary | ICD-10-CM | POA: Insufficient documentation

## 2022-08-19 DIAGNOSIS — J45909 Unspecified asthma, uncomplicated: Secondary | ICD-10-CM | POA: Insufficient documentation

## 2022-08-19 DIAGNOSIS — F17210 Nicotine dependence, cigarettes, uncomplicated: Secondary | ICD-10-CM | POA: Insufficient documentation

## 2022-08-19 DIAGNOSIS — R0789 Other chest pain: Secondary | ICD-10-CM | POA: Diagnosis not present

## 2022-08-19 DIAGNOSIS — R112 Nausea with vomiting, unspecified: Secondary | ICD-10-CM | POA: Diagnosis present

## 2022-08-19 LAB — SARS CORONAVIRUS 2 BY RT PCR: SARS Coronavirus 2 by RT PCR: NEGATIVE

## 2022-08-19 MED ORDER — ONDANSETRON HCL 4 MG/2ML IJ SOLN
4.0000 mg | Freq: Once | INTRAMUSCULAR | Status: AC
  Administered 2022-08-19: 4 mg via INTRAVENOUS
  Filled 2022-08-19: qty 2

## 2022-08-19 MED ORDER — KETOROLAC TROMETHAMINE 15 MG/ML IJ SOLN
15.0000 mg | Freq: Once | INTRAMUSCULAR | Status: AC
  Administered 2022-08-19: 15 mg via INTRAVENOUS
  Filled 2022-08-19: qty 1

## 2022-08-19 MED ORDER — SODIUM CHLORIDE 0.9 % IV BOLUS
1000.0000 mL | Freq: Once | INTRAVENOUS | Status: AC
  Administered 2022-08-19: 1000 mL via INTRAVENOUS

## 2022-08-19 NOTE — ED Provider Notes (Signed)
[facility] Provider Note: Georgena Spurling, MD, FACEP  CSN: [id number] MRN: [id number] ARRIVAL: 08/19/22 at 2215 ROOM: [facility]   HISTORY OF PRESENT ILLNESS  08/19/22 11:30 PM SCORPIO FORTIN is a 51 y.o. male who awakened feeling bad this morning about 3 AM.  Specifically he has had headache, body aches, chest pain with deep breathing, nausea, vomiting and diarrhea.  He also had some shortness of breath with exertion earlier but none at rest.  He has not been coughing or had nasal congestion.  He estimates he has vomited twice and had 9 episodes of diarrhea.  His urine is darker than usual.  He has had some intermittent abdominal cramping with this.  He was tested for COVID at work [facility] [employer] [facility]) which was negative.   Past Medical History:  Diagnosis Date   Anxiety    Arthritis    " in my back "   Asthma    Balance problem    Family history of colonic polyps 05/08/2022   Gait difficulty    GERD (gastroesophageal reflux disease)    Headache    migraines   History of colonic polyps 05/08/2022   Inguinal hernia of left side without obstruction or gangrene    Pneumonia    Seizures ([place])    " its been along time ,since my last seizure "   Spleen enlarged    Thrombocytopenia ([place])    Tobacco abuse     Past Surgical History:  Procedure Laterality Date   ANTERIOR CERVICAL DECOMP/DISCECTOMY FUSION N/A 04/16/2021   Procedure: Cervical three-four  Anterior cervical decompression/discectomy/fusion;  Surgeon: Karsten Ro, DO;  Location: [facility];  Service: Neurosurgery;  Laterality: N/A;   HERNIA REPAIR     inguinal   SURAL NERVE BX Right 12/11/2020   Procedure: SURAL NERVE BIOPSY;  Surgeon: Karsten Ro, DO;  Location: [facility];  Service: Neurosurgery;  Laterality: Right;    Family History  Problem Relation Age of Onset   Diabetes Mother    Hypertension Mother    COPD Mother    Colon polyps Father        unknown number   Alcoholism Brother    Colon  cancer Neg Hx    Esophageal cancer Neg Hx    Stomach cancer Neg Hx    Rectal cancer Neg Hx     Social History   Tobacco Use   Smoking status: Every Day    Packs/day: 0.50    Years: 24.00    Total pack years: 12.00    Types: Cigarettes   Smokeless tobacco: Current    Types: Snuff  Vaping Use   Vaping Use: Never used  Substance Use Topics   Alcohol use: No   Drug use: No    Prior to Admission medications   Medication Sig Start Date End Date Taking? Authorizing Provider  metoCLOPramide (REGLAN) 10 MG tablet Take 1 tablet (10 mg total) by mouth every 6 (six) hours as needed for nausea or vomiting. 08/20/22  Yes Mickaela Starlin, MD  acetaminophen (TYLENOL) 325 MG tablet Take 650 mg by mouth every 6 (six) hours as needed.    [provider]  albuterol (VENTOLIN HFA) 108 (90 Base) MCG/ACT inhaler Inhale 1-2 puffs into the lungs every 6 (six) hours as needed for wheezing or shortness of breath. 07/18/22   Haydee Salter, MD  diazepam (VALIUM) 5 MG tablet Take 1 tablet by mouth 45 minutes prior to procedure and 1  tablet by mouth 20 minutes prior to procedure if needed 06/23/22     gabapentin (NEURONTIN) 300 MG capsule Take 1 capsule (300 mg total) by mouth 3 (three) times daily. 08/07/22   Libby Maw, MD  lidocaine (LIDODERM) 5 % Place 1 patch onto the skin daily. Remove & Discard patch within 12 hours or as directed by MD 02/16/22   Hendricks Limes, PA-C  loperamide (IMODIUM) 2 MG capsule Take by mouth. 03/17/22   [provider]  methocarbamol (ROBAXIN) 750 MG tablet Take 1 tablet by mouth 3 times daily as needed. 07/10/22     PARoxetine (PAXIL) 40 MG tablet Take 1 tablet (40 mg total) by mouth every morning. 12/10/21   Libby Maw, MD  QUEtiapine (SEROQUEL) 25 MG tablet TAKE 1 TABLET BY MOUTH EVERYDAY AT BEDTIME 03/21/22   Libby Maw, MD  tiZANidine (ZANAFLEX) 4 MG tablet Take 1 tablet by mouth every 6 - 8 hours as needed **Do not exceed 3  doses in 24 hours 05/22/22     traMADol (ULTRAM) 50 MG tablet May take one at night as needed for pain. 06/22/22   Libby Maw, MD    Allergies Hydromorphone   REVIEW OF SYSTEMS  Negative except as noted here or in the History of Present Illness.   PHYSICAL EXAMINATION  Initial Vital Signs Blood pressure (!) 151/93, pulse 95, temperature 97.8 F (36.6 C), resp. rate (!) 22, height '6\' 1"'$  (1.854 m), weight 99.3 kg, SpO2 98 %.  Examination General: Well-developed, well-nourished male in no acute distress; appearance consistent with age of record HENT: normocephalic; atraumatic Eyes: pupils equal, round and reactive to light; extraocular muscles intact Neck: supple Heart: regular rate and rhythm Lungs: clear to auscultation bilaterally Abdomen: soft; nondistended; nontender; bowel sounds present Extremities: No deformity; full range of motion; pulses normal Neurologic: Awake, alert and oriented; motor function intact in all extremities and symmetric; no facial droop Skin: Warm and dry Psychiatric: Normal mood and affect   RESULTS  Summary of this visit's results, reviewed and interpreted by myself:   EKG Interpretation  Date/Time:  Tuesday August 19 2022 22:22:41 EDT Ventricular Rate:  99 PR Interval:  136 QRS Duration: 80 QT Interval:  332 QTC Calculation: 426 R Axis:   90 Text Interpretation: Normal sinus rhythm Rightward axis Borderline ECG No significant change was found Confirmed by Shanon Rosser [phone]) on 08/19/2022 10:41:52 PM       Laboratory Studies: Results for orders placed or performed during the hospital encounter of 08/19/22 (from the past 24 hour(s))  SARS Coronavirus 2 by RT PCR (hospital order, performed in [facility] hospital lab) *cepheid single result test* Anterior Nasal Swab     Status: None   Collection Time: 08/19/22 10:24 PM   Specimen: Anterior Nasal Swab  Result Value Ref Range   SARS Coronavirus 2 by RT PCR NEGATIVE NEGATIVE   CBC with Differential     Status: Abnormal   Collection Time: 08/19/22 11:37 PM  Result Value Ref Range   WBC 11.9 (H) 4.0 - 10.5 K/uL   RBC 5.59 4.22 - 5.81 MIL/uL   Hemoglobin 16.2 13.0 - 17.0 g/dL   HCT 46.3 39.0 - 52.0 %   MCV 82.8 80.0 - 100.0 fL   MCH 29.0 26.0 - 34.0 pg   MCHC 35.0 30.0 - 36.0 g/dL   RDW 14.5 11.5 - 15.5 %   Platelets 123 (L) 150 - 400 K/uL   nRBC 0.0 0.0 - 0.2 %  Neutrophils Relative % 80 %   Neutro Abs 9.4 (H) 1.7 - 7.7 K/uL   Lymphocytes Relative 10 %   Lymphs Abs 1.2 0.7 - 4.0 K/uL   Monocytes Relative 5 %   Monocytes Absolute 0.6 0.1 - 1.0 K/uL   Eosinophils Relative 5 %   Eosinophils Absolute 0.5 0.0 - 0.5 K/uL   Basophils Relative 0 %   Basophils Absolute 0.1 0.0 - 0.1 K/uL   Immature Granulocytes 0 %   Abs Immature Granulocytes 0.03 0.00 - 0.07 K/uL  Basic metabolic panel     Status: Abnormal   Collection Time: 08/19/22 11:37 PM  Result Value Ref Range   Sodium 136 135 - 145 mmol/L   Potassium 3.8 3.5 - 5.1 mmol/L   Chloride 101 98 - 111 mmol/L   CO2 27 22 - 32 mmol/L   Glucose, Bld 105 (H) 70 - 99 mg/dL   BUN 9 6 - 20 mg/dL   Creatinine, Ser 0.69 0.61 - 1.24 mg/dL   Calcium 10.0 8.9 - 10.3 mg/dL   GFR, Estimated >60 >60 mL/min   Anion gap 8 5 - 15  Resp Panel by RT-PCR (Flu A&B, Covid) Anterior Nasal Swab     Status: None   Collection Time: 08/20/22 12:01 AM   Specimen: Anterior Nasal Swab  Result Value Ref Range   SARS Coronavirus 2 by RT PCR NEGATIVE NEGATIVE   Influenza A by PCR NEGATIVE NEGATIVE   Influenza B by PCR NEGATIVE NEGATIVE  Urinalysis, Routine w reflex microscopic     Status: None   Collection Time: 08/20/22 12:27 AM  Result Value Ref Range   Color, Urine YELLOW YELLOW   APPearance CLEAR CLEAR   Specific Gravity, Urine 1.014 1.005 - 1.030   pH 6.0 5.0 - 8.0   Glucose, UA NEGATIVE NEGATIVE mg/dL   Hgb urine dipstick NEGATIVE NEGATIVE   Bilirubin Urine NEGATIVE NEGATIVE   Ketones, ur NEGATIVE NEGATIVE mg/dL    Protein, ur NEGATIVE NEGATIVE mg/dL   Nitrite NEGATIVE NEGATIVE   Leukocytes,Ua NEGATIVE NEGATIVE   Imaging Studies: No results found.  ED COURSE and MDM  Nursing notes, initial and subsequent vitals signs, including pulse oximetry, reviewed and interpreted by myself.  Vitals:   08/19/22 2219 08/19/22 2220 08/20/22 0030 08/20/22 0045  BP: (!) 151/93  115/83 124/84  Pulse: 95  70 75  Resp: (!) '22  18 18  '$ Temp: 97.8 F (36.6 C)     SpO2: 98%  100% 100%  Weight:  99.3 kg    Height:  '6\' 1"'$  (1.854 m)     Medications  loperamide (IMODIUM) capsule 4 mg (has no administration in time range)  sodium chloride 0.9 % bolus 1,000 mL (0 mLs Intravenous Stopped 08/20/22 0207)  ondansetron (ZOFRAN) injection 4 mg (4 mg Intravenous Given 08/19/22 2352)  ketorolac (TORADOL) 15 MG/ML injection 15 mg (15 mg Intravenous Given 08/19/22 2352)  metoCLOPramide (REGLAN) injection 10 mg (10 mg Intravenous Given 08/20/22 0207)   1:56 AM Nausea persist despite Zofran, will give Reglan.  Patient negative for COVID and influenza but presentation is consistent with a viral illness.   2:42 AM Nausea relieved with IV Reglan.  Patient able to tolerate fluids without vomiting.  He just had a significant diarrheal stool and we will give him a dose of Imodium prior to discharge.   PROCEDURES  Procedures   ED DIAGNOSES     ICD-10-CM   1. Viral gastroenteritis  A08.4  Rohan Juenger, Jenny Reichmann, MD 08/20/22 [phone]

## 2022-08-19 NOTE — ED Triage Notes (Signed)
Pt presents to the ED with HA, congestion, diarrhea, chest pain, vomiting, and body aches. Has recently been exposed to covid. Pt reports that the  chest pain is similar to the rest of his body aches.

## 2022-08-20 DIAGNOSIS — R519 Headache, unspecified: Secondary | ICD-10-CM | POA: Diagnosis not present

## 2022-08-20 DIAGNOSIS — Z20822 Contact with and (suspected) exposure to covid-19: Secondary | ICD-10-CM | POA: Diagnosis not present

## 2022-08-20 DIAGNOSIS — A084 Viral intestinal infection, unspecified: Secondary | ICD-10-CM | POA: Diagnosis not present

## 2022-08-20 DIAGNOSIS — R079 Chest pain, unspecified: Secondary | ICD-10-CM | POA: Diagnosis not present

## 2022-08-20 DIAGNOSIS — F17210 Nicotine dependence, cigarettes, uncomplicated: Secondary | ICD-10-CM | POA: Diagnosis not present

## 2022-08-20 DIAGNOSIS — J45909 Unspecified asthma, uncomplicated: Secondary | ICD-10-CM | POA: Diagnosis not present

## 2022-08-20 LAB — BASIC METABOLIC PANEL
Anion gap: 8 (ref 5–15)
BUN: 9 mg/dL (ref 6–20)
CO2: 27 mmol/L (ref 22–32)
Calcium: 10 mg/dL (ref 8.9–10.3)
Chloride: 101 mmol/L (ref 98–111)
Creatinine, Ser: 0.69 mg/dL (ref 0.61–1.24)
GFR, Estimated: >60 mL/min (ref >60)
Glucose, Bld: 105 mg/dL — ABNORMAL HIGH (ref 70–99)
Potassium: 3.8 mmol/L (ref 3.5–5.1)
Sodium: 136 mmol/L (ref 135–145)

## 2022-08-20 LAB — RESP PANEL BY RT-PCR (FLU A&B, COVID) ARPGX2
Influenza A by PCR: NEGATIVE
Influenza B by PCR: NEGATIVE
SARS Coronavirus 2 by RT PCR: NEGATIVE

## 2022-08-20 LAB — CBC WITH DIFFERENTIAL/PLATELET
Abs Immature Granulocytes: 0.03 10*3/uL (ref 0.00–0.07)
Basophils Absolute: 0.1 10*3/uL (ref 0.0–0.1)
Basophils Relative: 0 %
Eosinophils Absolute: 0.5 10*3/uL (ref 0.0–0.5)
Eosinophils Relative: 5 %
HCT: 46.3 % (ref 39.0–52.0)
Hemoglobin: 16.2 g/dL (ref 13.0–17.0)
Immature Granulocytes: 0 %
Lymphocytes Relative: 10 %
Lymphs Abs: 1.2 10*3/uL (ref 0.7–4.0)
MCH: 29 pg (ref 26.0–34.0)
MCHC: 35 g/dL (ref 30.0–36.0)
MCV: 82.8 fL (ref 80.0–100.0)
Monocytes Absolute: 0.6 10*3/uL (ref 0.1–1.0)
Monocytes Relative: 5 %
Neutro Abs: 9.4 10*3/uL — ABNORMAL HIGH (ref 1.7–7.7)
Neutrophils Relative %: 80 %
Platelets: 123 10*3/uL — ABNORMAL LOW (ref 150–400)
RBC: 5.59 MIL/uL (ref 4.22–5.81)
RDW: 14.5 % (ref 11.5–15.5)
WBC: 11.9 10*3/uL — ABNORMAL HIGH (ref 4.0–10.5)
nRBC: 0 % (ref 0.0–0.2)

## 2022-08-20 LAB — URINALYSIS, ROUTINE W REFLEX MICROSCOPIC
Bilirubin Urine: NEGATIVE
Glucose, UA: NEGATIVE mg/dL
Hgb urine dipstick: NEGATIVE
Ketones, ur: NEGATIVE mg/dL
Leukocytes,Ua: NEGATIVE
Nitrite: NEGATIVE
Protein, ur: NEGATIVE mg/dL
Specific Gravity, Urine: 1.014 (ref 1.005–1.030)
pH: 6 (ref 5.0–8.0)

## 2022-08-20 MED ORDER — LOPERAMIDE HCL 2 MG PO CAPS
4.0000 mg | ORAL_CAPSULE | Freq: Once | ORAL | Status: AC
  Administered 2022-08-20: 4 mg via ORAL
  Filled 2022-08-20: qty 2

## 2022-08-20 MED ORDER — METOCLOPRAMIDE HCL 10 MG PO TABS: 10.0000 mg | ORAL_TABLET | Freq: Four times a day (QID) | ORAL | 0 refills | Status: DC | PRN

## 2022-08-20 MED ORDER — METOCLOPRAMIDE HCL 5 MG/ML IJ SOLN
10.0000 mg | Freq: Once | INTRAMUSCULAR | Status: AC
  Administered 2022-08-20: 10 mg via INTRAVENOUS
  Filled 2022-08-20: qty 2

## 2022-08-20 NOTE — ED Notes (Signed)
Fluids tolerated

## 2022-08-20 NOTE — ED Notes (Signed)
Reviewed AVS/discharge instruction with patient. Time allotted for and all questions answered. Patient is agreeable for d/c and escorted to ed exit by staff.  

## 2022-08-20 NOTE — ED Notes (Signed)
Patient reports feeling better, denies nausea at this time. PO fluids given to patient at this time

## 2022-08-25 ENCOUNTER — Emergency Department (HOSPITAL_BASED_OUTPATIENT_CLINIC_OR_DEPARTMENT_OTHER)
Admission: EM | Admit: 2022-08-25 | Discharge: 2022-08-25 | Disposition: A | Discharge status: Home / Self Care | Payer: 59 | Attending: Emergency Medicine | Admitting: Emergency Medicine

## 2022-08-25 ENCOUNTER — Encounter (HOSPITAL_BASED_OUTPATIENT_CLINIC_OR_DEPARTMENT_OTHER): Payer: Self-pay

## 2022-08-25 ENCOUNTER — Emergency Department (HOSPITAL_BASED_OUTPATIENT_CLINIC_OR_DEPARTMENT_OTHER): Payer: 59

## 2022-08-25 ENCOUNTER — Other Ambulatory Visit: Payer: Self-pay

## 2022-08-25 DIAGNOSIS — A084 Viral intestinal infection, unspecified: Secondary | ICD-10-CM | POA: Insufficient documentation

## 2022-08-25 DIAGNOSIS — R1030 Lower abdominal pain, unspecified: Secondary | ICD-10-CM | POA: Diagnosis not present

## 2022-08-25 DIAGNOSIS — J45909 Unspecified asthma, uncomplicated: Secondary | ICD-10-CM | POA: Diagnosis not present

## 2022-08-25 DIAGNOSIS — R112 Nausea with vomiting, unspecified: Secondary | ICD-10-CM | POA: Diagnosis present

## 2022-08-25 DIAGNOSIS — D72829 Elevated white blood cell count, unspecified: Secondary | ICD-10-CM | POA: Insufficient documentation

## 2022-08-25 LAB — COMPREHENSIVE METABOLIC PANEL
ALT: 54 U/L — ABNORMAL HIGH (ref 0–44)
AST: 29 U/L (ref 15–41)
Albumin: 4.9 g/dL (ref 3.5–5.0)
Alkaline Phosphatase: 84 U/L (ref 38–126)
Anion gap: 10 (ref 5–15)
BUN: 10 mg/dL (ref 6–20)
CO2: 28 mmol/L (ref 22–32)
Calcium: 10.3 mg/dL (ref 8.9–10.3)
Chloride: 101 mmol/L (ref 98–111)
Creatinine, Ser: 0.88 mg/dL (ref 0.61–1.24)
GFR, Estimated: >60 mL/min (ref >60)
Glucose, Bld: 102 mg/dL — ABNORMAL HIGH (ref 70–99)
Potassium: 4.1 mmol/L (ref 3.5–5.1)
Sodium: 139 mmol/L (ref 135–145)
Total Bilirubin: 0.7 mg/dL (ref 0.3–1.2)
Total Protein: 7.9 g/dL (ref 6.5–8.1)

## 2022-08-25 LAB — URINALYSIS, ROUTINE W REFLEX MICROSCOPIC
Bilirubin Urine: NEGATIVE
Glucose, UA: NEGATIVE mg/dL
Hgb urine dipstick: NEGATIVE
Ketones, ur: NEGATIVE mg/dL
Leukocytes,Ua: NEGATIVE
Nitrite: NEGATIVE
Protein, ur: NEGATIVE mg/dL
Specific Gravity, Urine: 1.015 (ref 1.005–1.030)
pH: 5.5 (ref 5.0–8.0)

## 2022-08-25 LAB — CBC
HCT: 48.3 % (ref 39.0–52.0)
Hemoglobin: 17 g/dL (ref 13.0–17.0)
MCH: 28.7 pg (ref 26.0–34.0)
MCHC: 35.2 g/dL (ref 30.0–36.0)
MCV: 81.6 fL (ref 80.0–100.0)
Platelets: 187 10*3/uL (ref 150–400)
RBC: 5.92 MIL/uL — ABNORMAL HIGH (ref 4.22–5.81)
RDW: 14.3 % (ref 11.5–15.5)
WBC: 11.9 10*3/uL — ABNORMAL HIGH (ref 4.0–10.5)
nRBC: 0 % (ref 0.0–0.2)

## 2022-08-25 LAB — LIPASE, BLOOD: Lipase: 48 U/L (ref 11–51)

## 2022-08-25 LAB — TROPONIN I (HIGH SENSITIVITY): Troponin I (High Sensitivity): <2 ng/L (ref <18)

## 2022-08-25 MED ORDER — MORPHINE SULFATE (PF) 4 MG/ML IV SOLN
4.0000 mg | Freq: Once | INTRAVENOUS | Status: AC
  Administered 2022-08-25: 4 mg via INTRAVENOUS
  Filled 2022-08-25: qty 1

## 2022-08-25 MED ORDER — IOHEXOL 300 MG/ML  SOLN
80.0000 mL | Freq: Once | INTRAMUSCULAR | Status: AC | PRN
  Administered 2022-08-25: 80 mL via INTRAVENOUS

## 2022-08-25 MED ORDER — ONDANSETRON HCL 4 MG/2ML IJ SOLN
4.0000 mg | Freq: Once | INTRAMUSCULAR | Status: AC
  Administered 2022-08-25: 4 mg via INTRAVENOUS
  Filled 2022-08-25: qty 2

## 2022-08-25 MED ORDER — ONDANSETRON HCL 4 MG PO TABS: 4.0000 mg | ORAL_TABLET | Freq: Four times a day (QID) | ORAL | 0 refills | Status: DC

## 2022-08-25 NOTE — ED Triage Notes (Signed)
Patient here POV from Home.  Endorses Main Complaint of Diarrhea and Associated Lower ABD Pain since Wednesday. Seen for Same in ED and discharged then.   Associated with Mild URI Symptoms such as Cough and Fatigue.   NAD Noted during Triage. A&Ox4. GCS 15. Ambulatory.

## 2022-08-25 NOTE — Discharge Instructions (Addendum)
Your work-up today was consistent with viral gastroenteritis.  There was no acute abnormality noted on your CT scans.  Please continue to hydrate as you are able at home.  Please take the prescribed nausea medication as directed for relief from nausea.  I recommend over-the-counter pain medication as needed to help with your symptoms.

## 2022-08-25 NOTE — ED Provider Notes (Signed)
[facility] EMERGENCY DEPT Provider Note   CSN: [id number] Arrival date & time: 08/25/22  1825     History  Chief Complaint  Patient presents with   Abdominal Pain    Alan Sanders is a 51 y.o. male.  Patient presents to the hospital complaining of 5 days of body aches, headache, nausea, vomiting, diarrhea, and abdominal pain.  Patient was evaluated at the same facility past Wednesday and was diagnosed with viral gastroenteritis at that time.  He states his symptoms have worsened since that time.  He has had multiple negative COVID tests since that time.  He denies shortness of breath, chest pain, urinary symptoms at this time.  Past medical history significant for anxiety, GERD, asthma, thrombocytopenia, seizures, gait difficulty  HPI     Home Medications Prior to Admission medications   Medication Sig Start Date End Date Taking? Authorizing Provider  acetaminophen (TYLENOL) 325 MG tablet Take 650 mg by mouth every 6 (six) hours as needed.    [provider]  albuterol (VENTOLIN HFA) 108 (90 Base) MCG/ACT inhaler Inhale 1-2 puffs into the lungs every 6 (six) hours as needed for wheezing or shortness of breath. 07/18/22   Haydee Salter, MD  diazepam (VALIUM) 5 MG tablet Take 1 tablet by mouth 45 minutes prior to procedure and 1 tablet by mouth 20 minutes prior to procedure if needed 06/23/22     gabapentin (NEURONTIN) 300 MG capsule Take 1 capsule (300 mg total) by mouth 3 (three) times daily. 08/07/22   Libby Maw, MD  lidocaine (LIDODERM) 5 % Place 1 patch onto the skin daily. Remove & Discard patch within 12 hours or as directed by MD 02/16/22   Hendricks Limes, PA-C  loperamide (IMODIUM) 2 MG capsule Take by mouth. 03/17/22   [provider]  methocarbamol (ROBAXIN) 750 MG tablet Take 1 tablet by mouth 3 times daily as needed. 07/10/22     metoCLOPramide (REGLAN) 10 MG tablet Take 1 tablet (10 mg total) by mouth every 6 (six) hours as  needed for nausea or vomiting. 08/20/22   Molpus, Jenny Reichmann, MD  ondansetron (ZOFRAN) 4 MG tablet Take 1 tablet (4 mg total) by mouth every 6 (six) hours. 08/25/22  Yes Dorothyann Peng, PA-C  PARoxetine (PAXIL) 40 MG tablet Take 1 tablet (40 mg total) by mouth every morning. 12/10/21   Libby Maw, MD  QUEtiapine (SEROQUEL) 25 MG tablet TAKE 1 TABLET BY MOUTH EVERYDAY AT BEDTIME 03/21/22   Libby Maw, MD  tiZANidine (ZANAFLEX) 4 MG tablet Take 1 tablet by mouth every 6 - 8 hours as needed **Do not exceed 3 doses in 24 hours 05/22/22     traMADol (ULTRAM) 50 MG tablet May take one at night as needed for pain. 06/22/22   Libby Maw, MD      Allergies    Hydromorphone    Review of Systems   Review of Systems  Constitutional:  Positive for chills and fatigue. Negative for fever.  Respiratory:  Positive for cough.   Cardiovascular:  Positive for chest pain.  Gastrointestinal:  Positive for abdominal pain, diarrhea, nausea and vomiting.  Genitourinary:  Negative for dysuria.  Neurological:  Positive for headaches.    Physical Exam Updated Vital Signs BP (!) 142/92   Pulse 86   Temp 98.3 F (36.8 C) (Oral)   Resp (!) 28   Ht '6\' 1"'$  (1.854 m)   Wt 99.3 kg   SpO2 99%  BMI 28.88 kg/m  Physical Exam Vitals and nursing note reviewed.  Constitutional:      General: He is not in acute distress.    Appearance: He is well-developed.  HENT:     Head: Normocephalic and atraumatic.  Eyes:     Extraocular Movements: Extraocular movements intact.     Conjunctiva/sclera: Conjunctivae normal.  Cardiovascular:     Rate and Rhythm: Normal rate and regular rhythm.     Heart sounds: No murmur heard. Pulmonary:     Effort: Pulmonary effort is normal. No respiratory distress.     Breath sounds: Normal breath sounds.  Abdominal:     General: Abdomen is flat.     Palpations: Abdomen is soft.     Tenderness: There is generalized abdominal tenderness.  Musculoskeletal:         General: No swelling.     Cervical back: Neck supple.  Skin:    General: Skin is warm and dry.     Capillary Refill: Capillary refill takes less than 2 seconds.  Neurological:     Mental Status: He is alert.  Psychiatric:        Mood and Affect: Mood normal.     ED Results / Procedures / Treatments   Labs (all labs ordered are listed, but only abnormal results are displayed) Labs Reviewed  CBC - Abnormal; Notable for the following components:      Result Value   WBC 11.9 (*)    RBC 5.92 (*)    All other components within normal limits  COMPREHENSIVE METABOLIC PANEL - Abnormal; Notable for the following components:   Glucose, Bld 102 (*)    ALT 54 (*)    All other components within normal limits  URINALYSIS, ROUTINE W REFLEX MICROSCOPIC  LIPASE, BLOOD  TROPONIN I (HIGH SENSITIVITY)  TROPONIN I (HIGH SENSITIVITY)    EKG None  Radiology CT ABDOMEN PELVIS W CONTRAST  Result Date: 08/25/2022 CLINICAL DATA:  Diarrhea and lower abdominal pain. EXAM: CT ABDOMEN AND PELVIS WITH CONTRAST TECHNIQUE: Multidetector CT imaging of the abdomen and pelvis was performed using the standard protocol following bolus administration of intravenous contrast. RADIATION DOSE REDUCTION: This exam was performed according to the departmental dose-optimization program which includes automated exposure control, adjustment of the mA and/or kV according to patient size and/or use of iterative reconstruction technique. CONTRAST:  47m OMNIPAQUE IOHEXOL 300 MG/ML  SOLN COMPARISON:  November 07, 2021 FINDINGS: Lower chest: No acute abnormality. Hepatobiliary: No focal liver abnormality is seen. No gallstones, gallbladder wall thickening, or biliary dilatation. Pancreas: Unremarkable. No pancreatic ductal dilatation or surrounding inflammatory changes. Spleen: Normal in size without focal abnormality. Adrenals/Urinary Tract: Adrenal glands are unremarkable. Kidneys are normal, without renal calculi, focal  lesion, or hydronephrosis. Bladder is unremarkable. Stomach/Bowel: Stomach is within normal limits. Appendix appears normal. No evidence of bowel wall thickening, distention, or inflammatory changes. Noninflamed diverticula are seen within the proximal to mid sigmoid colon. Vascular/Lymphatic: No significant vascular findings are present. No enlarged abdominal or pelvic lymph nodes. Reproductive: Mild calcification of a normal sized prostate gland is seen. Other: A 2.6 cm x 1.8 cm fat containing left inguinal hernia is seen. Small surgical clips are seen within the anterolateral aspect of the mid to lower right pelvic wall. No abdominopelvic ascites. Musculoskeletal: No acute or significant osseous findings. IMPRESSION: 1. Sigmoid diverticulosis. 2. Small fat containing left inguinal hernia. Electronically Signed   By: TVirgina NorfolkM.D.   On: 08/25/2022 22:07    Procedures Procedures  Medications Ordered in ED Medications  morphine (PF) 4 MG/ML injection 4 mg (4 mg Intravenous Given 08/25/22 2134)  ondansetron (ZOFRAN) injection 4 mg (4 mg Intravenous Given 08/25/22 2134)  iohexol (OMNIPAQUE) 300 MG/ML solution 80 mL (80 mLs Intravenous Contrast Given 08/25/22 2149)    ED Course/ Medical Decision Making/ A&P                           Medical Decision Making Amount and/or Complexity of Data Reviewed Labs: ordered. Radiology: ordered.  Risk Prescription drug management.   This patient presents to the ED for concern of abdominal pain, this involves an extensive number of treatment options, and is a complaint that carries with it a high risk of complications and morbidity.  The differential diagnosis includes appendicitis, cholecystitis, gastroenteritis, colitis, pancreatitis, others   Co morbidities that complicate the patient evaluation  History of anxiety   Additional history obtained:  External records from outside source obtained and reviewed including ED notes from last week  showing a diagnosis of viral gastroenteritis   Lab Tests:  I Ordered, and personally interpreted labs.  The pertinent results include: Unremarkable lipase, urinalysis, WBC 11.9 consistent with 1 week ago, unremarkable CMP, troponin less than 2   Imaging Studies ordered:  I ordered imaging studies including CT abdomen pelvis with contrast I independently visualized and interpreted imaging which showed  1. Sigmoid diverticulosis.  2. Small fat containing left inguinal hernia   I agree with the radiologist interpretation   Cardiac Monitoring: / EKG:  The patient was maintained on a cardiac monitor.  I personally viewed and interpreted the cardiac monitored which showed an underlying rhythm of: Sinus rhythm   Problem List / ED Course / Critical interventions / Medication management   I ordered medication including Zofran for nausea, morphine for pain Reevaluation of the patient after these medicines showed that the patient improved I have reviewed the patients home medicines and have made adjustments as needed   Social Determinants of Health:  Patient works for [employer] / Admission - Considered:  Lipase is normal showing no pancreatitis, CT with no evidence of appendicitis, cholecystitis, colitis.  Diverticulosis would not explain the patient's symptoms.  This continues to seem to be a viral gastroenteritis.  There is no indication for admission.  Vitals are normal.  Patient to discharge home        Final Clinical Impression(s) / ED Diagnoses Final diagnoses:  Viral gastroenteritis    Rx / DC Orders ED Discharge Orders          Ordered    ondansetron (ZOFRAN) 4 MG tablet  Every 6 hours        08/25/22 2325              Ronny Bacon 08/25/22 2327    Cristie Hem, MD 08/26/22 [phone]

## 2022-08-26 ENCOUNTER — Other Ambulatory Visit (HOSPITAL_COMMUNITY): Payer: Self-pay

## 2022-08-26 ENCOUNTER — Other Ambulatory Visit: Payer: Self-pay | Admitting: Family Medicine

## 2022-08-26 DIAGNOSIS — M79671 Pain in right foot: Secondary | ICD-10-CM

## 2022-08-26 MED ORDER — GABAPENTIN 300 MG PO CAPS
300.0000 mg | ORAL_CAPSULE | Freq: Three times a day (TID) | ORAL | 0 refills | Status: DC
  Filled 2022-08-26 – 2023-03-19 (×6): qty 90, 30d supply, fill #0

## 2022-08-26 MED ORDER — METHOCARBAMOL 750 MG PO TABS
750.0000 mg | ORAL_TABLET | Freq: Three times a day (TID) | ORAL | 1 refills | Status: DC | PRN
  Filled 2022-08-26 – 2022-09-14 (×3): qty 60, 20d supply, fill #0
  Filled 2022-12-09 – 2022-12-19 (×2): qty 60, 20d supply, fill #1

## 2022-08-28 DIAGNOSIS — T63441A Toxic effect of venom of bees, accidental (unintentional), initial encounter: Secondary | ICD-10-CM | POA: Diagnosis not present

## 2022-08-29 ENCOUNTER — Other Ambulatory Visit (HOSPITAL_COMMUNITY): Payer: Self-pay

## 2022-09-15 ENCOUNTER — Other Ambulatory Visit (HOSPITAL_COMMUNITY): Payer: Self-pay

## 2022-10-01 ENCOUNTER — Other Ambulatory Visit: Payer: Self-pay | Admitting: Family Medicine

## 2022-10-01 DIAGNOSIS — S39012D Strain of muscle, fascia and tendon of lower back, subsequent encounter: Secondary | ICD-10-CM

## 2022-10-02 ENCOUNTER — Encounter: Payer: Self-pay | Admitting: Gastroenterology

## 2022-10-02 MED ORDER — TRAMADOL HCL 50 MG PO TABS: ORAL_TABLET | ORAL | 0 refills | Status: DC

## 2022-10-06 ENCOUNTER — Other Ambulatory Visit (HOSPITAL_COMMUNITY): Payer: Self-pay

## 2022-10-06 MED ORDER — GABAPENTIN 100 MG PO CAPS
100.0000 mg | ORAL_CAPSULE | Freq: Every morning | ORAL | 2 refills | Status: DC
  Filled 2022-10-06: qty 90, 90d supply, fill #0
  Filled 2022-11-20 – 2022-12-09 (×2): qty 90, 90d supply, fill #1

## 2022-10-06 MED ORDER — GABAPENTIN 300 MG PO CAPS
300.0000 mg | ORAL_CAPSULE | Freq: Every evening | ORAL | 2 refills | Status: DC
  Filled 2022-10-06: qty 90, 90d supply, fill #0
  Filled 2022-11-20 – 2022-12-19 (×3): qty 90, 90d supply, fill #1
  Filled 2023-02-08: qty 90, 90d supply, fill #2

## 2022-10-06 MED ORDER — METHOCARBAMOL 750 MG PO TABS
750.0000 mg | ORAL_TABLET | Freq: Three times a day (TID) | ORAL | 2 refills | Status: DC | PRN
  Filled 2022-10-06: qty 90, 30d supply, fill #0
  Filled 2022-11-20: qty 90, 30d supply, fill #1
  Filled 2022-12-09 – 2023-01-26 (×3): qty 90, 30d supply, fill #2

## 2022-10-08 ENCOUNTER — Other Ambulatory Visit (HOSPITAL_COMMUNITY): Payer: Self-pay

## 2022-10-10 ENCOUNTER — Other Ambulatory Visit (HOSPITAL_COMMUNITY): Payer: Self-pay

## 2022-10-13 ENCOUNTER — Other Ambulatory Visit (HOSPITAL_COMMUNITY): Payer: Self-pay

## 2022-10-22 ENCOUNTER — Ambulatory Visit (AMBULATORY_SURGERY_CENTER): Payer: Self-pay

## 2022-10-22 ENCOUNTER — Other Ambulatory Visit (HOSPITAL_COMMUNITY): Payer: Self-pay

## 2022-10-22 VITALS — Ht 73.0 in | Wt 228.0 lb

## 2022-10-22 DIAGNOSIS — Z86010 Personal history of colonic polyps: Secondary | ICD-10-CM

## 2022-10-22 MED ORDER — NA SULFATE-K SULFATE-MG SULF 17.5-3.13-1.6 GM/177ML PO SOLN
1.0000 | Freq: Once | ORAL | 0 refills | Status: AC
  Filled 2022-10-22: qty 354, 1d supply, fill #0

## 2022-10-22 NOTE — Progress Notes (Signed)
Pre visit completed via phone call; Patient verified name, DOB, and address;  No egg or soy allergy known to patient;  No issues known to pt with past sedation with any surgeries or procedures; Patient denies ever being told they had issues or difficulty with intubation;  No FH of Malignant Hyperthermia; Pt is not on diet pills; Pt is not on home 02;  Pt is not on blood thinners;  Pt denies issues with constipation; No A fib or A flutter; Have any cardiac testing pending--NO Pt instructed to use [email or web address] or GoodRx for a price reduction on prep;   Insurance verified during [place] appt=[facility]  Patient's chart reviewed by Osvaldo Angst CNRA prior to previsit and patient appropriate for the [place].  Previsit completed and red dot placed by patient's name on their procedure day (on provider's schedule).    Patient reports his last seizure was in [place] at age 51 yrs old;

## 2022-10-25 ENCOUNTER — Other Ambulatory Visit: Payer: Self-pay

## 2022-10-25 ENCOUNTER — Emergency Department (HOSPITAL_BASED_OUTPATIENT_CLINIC_OR_DEPARTMENT_OTHER)
Admission: EM | Admit: 2022-10-25 | Discharge: 2022-10-25 | Disposition: A | Discharge status: Home / Self Care | Payer: 59 | Attending: Emergency Medicine | Admitting: Emergency Medicine

## 2022-10-25 ENCOUNTER — Encounter (HOSPITAL_BASED_OUTPATIENT_CLINIC_OR_DEPARTMENT_OTHER): Payer: Self-pay

## 2022-10-25 DIAGNOSIS — U071 COVID-19: Secondary | ICD-10-CM | POA: Diagnosis not present

## 2022-10-25 DIAGNOSIS — R0981 Nasal congestion: Secondary | ICD-10-CM | POA: Diagnosis present

## 2022-10-25 LAB — RESP PANEL BY RT-PCR (FLU A&B, COVID) ARPGX2
Influenza A by PCR: NEGATIVE
Influenza B by PCR: NEGATIVE
SARS Coronavirus 2 by RT PCR: POSITIVE — AB

## 2022-10-25 MED ORDER — ACETAMINOPHEN 500 MG PO TABS: 500.0000 mg | ORAL_TABLET | Freq: Four times a day (QID) | ORAL | 0 refills | Status: DC | PRN

## 2022-10-25 MED ORDER — IBUPROFEN 800 MG PO TABS: 800.0000 mg | ORAL_TABLET | Freq: Three times a day (TID) | ORAL | 0 refills | Status: DC

## 2022-10-25 MED ORDER — ONDANSETRON 4 MG PO TBDP
4.0000 mg | ORAL_TABLET | Freq: Once | ORAL | Status: AC
  Administered 2022-10-25: 4 mg via ORAL
  Filled 2022-10-25: qty 1

## 2022-10-25 MED ORDER — IBUPROFEN 400 MG PO TABS
400.0000 mg | ORAL_TABLET | Freq: Once | ORAL | Status: AC | PRN
  Administered 2022-10-25: 400 mg via ORAL
  Filled 2022-10-25: qty 1

## 2022-10-25 MED ORDER — ONDANSETRON HCL 4 MG PO TABS: 4.0000 mg | ORAL_TABLET | Freq: Four times a day (QID) | ORAL | 0 refills | Status: DC

## 2022-10-25 MED ORDER — ACETAMINOPHEN 325 MG PO TABS
650.0000 mg | ORAL_TABLET | Freq: Once | ORAL | Status: AC
  Administered 2022-10-25: 650 mg via ORAL
  Filled 2022-10-25: qty 2

## 2022-10-25 NOTE — ED Triage Notes (Signed)
Pt with reports of vomiting, HA, and generalized body aches. Intermittent dyspnea. Wife recently dx with COVID.

## 2022-10-25 NOTE — ED Provider Notes (Signed)
[facility] EMERGENCY DEPARTMENT Provider Note   CSN: [id number] Arrival date & time: 10/25/22  1903     History  Chief Complaint  Patient presents with   Nasal Congestion   Headache    Alan Sanders is a 51 y.o. male presenting today with URI symptoms.  Reports his wife tested positive for COVID.  He has been experiencing congestion, headache, sore throat and body aches since Monday.   Headache      Home Medications Prior to Admission medications   Medication Sig Start Date End Date Taking? Authorizing Provider  acetaminophen (TYLENOL) 500 MG tablet Take 1 tablet (500 mg total) by mouth every 6 (six) hours as needed. 10/25/22  Yes Aleczander Fandino A, PA-C  ibuprofen (ADVIL) 800 MG tablet Take 1 tablet (800 mg total) by mouth 3 (three) times daily. 10/25/22  Yes Terrea Bruster A, PA-C  ondansetron (ZOFRAN) 4 MG tablet Take 1 tablet (4 mg total) by mouth every 6 (six) hours. 10/25/22  Yes Doyt Castellana A, PA-C  albuterol (VENTOLIN HFA) 108 (90 Base) MCG/ACT inhaler Inhale 1-2 puffs into the lungs every 6 (six) hours as needed for wheezing or shortness of breath. 07/18/22   Haydee Salter, MD  gabapentin (NEURONTIN) 100 MG capsule Take 1 capsule (100 mg total) by mouth every morning. 10/06/22   Dawley, Troy C, DO  gabapentin (NEURONTIN) 300 MG capsule Take 1 capsule (300 mg total) by mouth 3 (three) times daily. 08/26/22   Libby Maw, MD  gabapentin (NEURONTIN) 300 MG capsule Take 1 capsule (300 mg total) by mouth every evening. 10/06/22   Dawley, Troy C, DO  loperamide (IMODIUM) 2 MG capsule Take 2 mg by mouth daily as needed. 03/17/22   [provider]  methocarbamol (ROBAXIN) 750 MG tablet Take 1 tablet (750 mg total) by mouth 3 (three) times daily as needed. 08/26/22     methocarbamol (ROBAXIN) 750 MG tablet Take 1 tablet (750 mg total) by mouth 3 (three) times daily as needed. 10/06/22     metoCLOPramide (REGLAN) 10 MG tablet Take 1 tablet (10  mg total) by mouth every 6 (six) hours as needed for nausea or vomiting. 08/20/22   Molpus, Jenny Reichmann, MD  PARoxetine (PAXIL) 40 MG tablet Take 1 tablet (40 mg total) by mouth every morning. 12/10/21   Libby Maw, MD  QUEtiapine (SEROQUEL) 25 MG tablet TAKE 1 TABLET BY MOUTH EVERYDAY AT BEDTIME 03/21/22   Libby Maw, MD  tiZANidine (ZANAFLEX) 4 MG tablet Take 1 tablet by mouth every 6 - 8 hours as needed **Do not exceed 3 doses in 24 hours 05/22/22     traMADol (ULTRAM) 50 MG tablet May take one at night as needed for pain. 10/02/22   Libby Maw, MD      Allergies    Hydromorphone    Review of Systems   Review of Systems  Neurological:  Positive for headaches.    Physical Exam Updated Vital Signs BP 126/86   Pulse (!) 104   Temp 98.1 F (36.7 C) (Oral)   Resp 20   Ht '6\' 1"'$  (1.854 m)   Wt 103.4 kg   SpO2 95%   BMI 30.08 kg/m  Physical Exam Vitals and nursing note reviewed.  Constitutional:      General: He is not in acute distress.    Appearance: Normal appearance. He is not ill-appearing.  HENT:     Head: Normocephalic and atraumatic.  Eyes:  General: No scleral icterus.    Conjunctiva/sclera: Conjunctivae normal.  Cardiovascular:     Rate and Rhythm: Normal rate and regular rhythm.  Pulmonary:     Effort: Pulmonary effort is normal. No respiratory distress.     Breath sounds: No wheezing or rales.  Skin:    General: Skin is warm and dry.     Findings: No rash.  Neurological:     Mental Status: He is alert.  Psychiatric:        Mood and Affect: Mood normal.     ED Results / Procedures / Treatments   Labs (all labs ordered are listed, but only abnormal results are displayed) Labs Reviewed  RESP PANEL BY RT-PCR (FLU A&B, COVID) ARPGX2 - Abnormal; Notable for the following components:      Result Value   SARS Coronavirus 2 by RT PCR POSITIVE (*)    All other components within normal limits    EKG None  Radiology No results  found.  Procedures Procedures   Medications Ordered in ED Medications  ibuprofen (ADVIL) tablet 400 mg (400 mg Oral Given 10/25/22 1920)  ondansetron (ZOFRAN-ODT) disintegrating tablet 4 mg (4 mg Oral Given 10/25/22 2159)  acetaminophen (TYLENOL) tablet 650 mg (650 mg Oral Given 10/25/22 2159)    ED Course/ Medical Decision Making/ A&P                           Medical Decision Making Risk OTC drugs. Prescription drug management.   51 year old male presenting today with URI symptoms.  Symptoms have been going on for 5 days..  On physical exam they are ill-appearing however nontoxic appearing.  Airway clear, tolerating secretions.  Normal breath sounds, do not believe chest x-ray is needed.  Tested positive for COVID. We discussed that this is something that needs to run its course and he may use over-the-counter medications for his symptoms.  Given Zofran and NSAID and Tylenol in the department.  He is agreeable to discharge home.  Provided with a note for work.  Stable vitals and agreeable to discharge at this time  Final Clinical Impression(s) / ED Diagnoses Final diagnoses:  COVID-19    Rx / DC Orders ED Discharge Orders          Ordered    ondansetron (ZOFRAN) 4 MG tablet  Every 6 hours        10/25/22 2141    ibuprofen (ADVIL) 800 MG tablet  3 times daily        10/25/22 2141    acetaminophen (TYLENOL) 500 MG tablet  Every 6 hours PRN        10/25/22 2141           Results and diagnoses were explained to the patient. Return precautions discussed in full. Patient had no additional questions and expressed complete understanding.   This chart was dictated using voice recognition software.  Despite best efforts to proofread,  errors can occur which can change the documentation meaning.    Darliss Ridgel 10/25/22 2223    Gareth Morgan, MD 10/28/22 2043

## 2022-10-25 NOTE — Discharge Instructions (Signed)
You tested positive for COVID today.  Mucinex is a good medication for nasal congestion.  DayQuil and NyQuil or Tylenol Cold and flu are good options for your general flu symptoms such as body aches, fevers and headaches.  I have sent ondansetron to your pharmacy, this is for nausea.  I have also sent extra strength Tylenol and ibuprofen for your discomfort as well.  Do not hesitate to return to the emergency department with any worsening symptoms, especially shortness of breath, chest pain, fevers you are unable to treat at home or any other concerns.  It was a pleasure to meet you and we hope you feel better.  Your work note is attached!

## 2022-11-03 ENCOUNTER — Other Ambulatory Visit (HOSPITAL_COMMUNITY): Payer: Self-pay

## 2022-11-03 DIAGNOSIS — R1111 Vomiting without nausea: Secondary | ICD-10-CM | POA: Diagnosis not present

## 2022-11-03 DIAGNOSIS — A4189 Other specified sepsis: Secondary | ICD-10-CM | POA: Diagnosis not present

## 2022-11-03 DIAGNOSIS — J9811 Atelectasis: Secondary | ICD-10-CM | POA: Diagnosis not present

## 2022-11-03 DIAGNOSIS — J9601 Acute respiratory failure with hypoxia: Secondary | ICD-10-CM | POA: Diagnosis not present

## 2022-11-03 DIAGNOSIS — J45909 Unspecified asthma, uncomplicated: Secondary | ICD-10-CM | POA: Diagnosis not present

## 2022-11-03 DIAGNOSIS — J189 Pneumonia, unspecified organism: Secondary | ICD-10-CM | POA: Diagnosis not present

## 2022-11-03 DIAGNOSIS — R0902 Hypoxemia: Secondary | ICD-10-CM | POA: Diagnosis not present

## 2022-11-03 DIAGNOSIS — U071 COVID-19: Secondary | ICD-10-CM | POA: Diagnosis not present

## 2022-11-03 DIAGNOSIS — J101 Influenza due to other identified influenza virus with other respiratory manifestations: Secondary | ICD-10-CM | POA: Diagnosis not present

## 2022-11-03 DIAGNOSIS — F17210 Nicotine dependence, cigarettes, uncomplicated: Secondary | ICD-10-CM | POA: Diagnosis not present

## 2022-11-03 DIAGNOSIS — J8283 Eosinophilic asthma: Secondary | ICD-10-CM | POA: Diagnosis not present

## 2022-11-03 DIAGNOSIS — F341 Dysthymic disorder: Secondary | ICD-10-CM | POA: Diagnosis not present

## 2022-11-03 DIAGNOSIS — R000 Tachycardia, unspecified: Secondary | ICD-10-CM | POA: Diagnosis not present

## 2022-11-03 DIAGNOSIS — R6520 Severe sepsis without septic shock: Secondary | ICD-10-CM | POA: Diagnosis not present

## 2022-11-03 DIAGNOSIS — R0602 Shortness of breath: Secondary | ICD-10-CM | POA: Diagnosis not present

## 2022-11-03 DIAGNOSIS — R059 Cough, unspecified: Secondary | ICD-10-CM | POA: Diagnosis not present

## 2022-11-03 DIAGNOSIS — R079 Chest pain, unspecified: Secondary | ICD-10-CM | POA: Diagnosis not present

## 2022-11-05 ENCOUNTER — Telehealth: Payer: Self-pay | Admitting: Family Medicine

## 2022-11-05 ENCOUNTER — Encounter: Payer: Self-pay | Admitting: Gastroenterology

## 2022-11-05 NOTE — Telephone Encounter (Signed)
FYI message below.

## 2022-11-05 NOTE — Telephone Encounter (Signed)
FYI:Pt was admitted to [facility] on 11/03/22 for having pna, flu and covid. He was having trouble breathing.

## 2022-11-06 ENCOUNTER — Telehealth: Payer: Self-pay | Admitting: Gastroenterology

## 2022-11-06 NOTE — Telephone Encounter (Signed)
Please see note below and advise  

## 2022-11-06 NOTE — Telephone Encounter (Signed)
Patient is being released from hospital today for pneumonia and wants to know if he can still be able to have procedure done 12/19

## 2022-11-06 NOTE — Telephone Encounter (Signed)
Spoke with pt and he is aware of Dr. Jacky Kindle recommendations. Procedure has been cancelled and pt will call back in a month to reschedule the procedure.

## 2022-11-11 ENCOUNTER — Encounter: Payer: 59 | Admitting: Gastroenterology

## 2022-11-12 DIAGNOSIS — R0602 Shortness of breath: Secondary | ICD-10-CM | POA: Diagnosis not present

## 2022-11-20 ENCOUNTER — Other Ambulatory Visit (HOSPITAL_COMMUNITY): Payer: Self-pay

## 2022-11-21 ENCOUNTER — Other Ambulatory Visit (HOSPITAL_COMMUNITY): Payer: Self-pay

## 2022-12-09 ENCOUNTER — Other Ambulatory Visit (HOSPITAL_COMMUNITY): Payer: Self-pay

## 2022-12-19 ENCOUNTER — Other Ambulatory Visit (HOSPITAL_COMMUNITY): Payer: Self-pay

## 2022-12-19 ENCOUNTER — Other Ambulatory Visit: Payer: Self-pay

## 2022-12-29 ENCOUNTER — Emergency Department (HOSPITAL_COMMUNITY)
Admission: EM | Admit: 2022-12-29 | Discharge: 2022-12-30 | Disposition: A | Discharge status: Home / Self Care | Payer: Commercial Managed Care - PPO | Attending: Emergency Medicine | Admitting: Emergency Medicine

## 2022-12-29 DIAGNOSIS — K625 Hemorrhage of anus and rectum: Secondary | ICD-10-CM | POA: Diagnosis not present

## 2022-12-29 DIAGNOSIS — J45909 Unspecified asthma, uncomplicated: Secondary | ICD-10-CM | POA: Insufficient documentation

## 2022-12-29 DIAGNOSIS — R109 Unspecified abdominal pain: Secondary | ICD-10-CM

## 2022-12-29 DIAGNOSIS — R1030 Lower abdominal pain, unspecified: Secondary | ICD-10-CM | POA: Diagnosis not present

## 2022-12-29 DIAGNOSIS — R1032 Left lower quadrant pain: Secondary | ICD-10-CM | POA: Diagnosis not present

## 2022-12-29 DIAGNOSIS — R197 Diarrhea, unspecified: Secondary | ICD-10-CM | POA: Diagnosis not present

## 2022-12-29 DIAGNOSIS — R9431 Abnormal electrocardiogram [ECG] [EKG]: Secondary | ICD-10-CM | POA: Diagnosis not present

## 2022-12-29 DIAGNOSIS — Z7951 Long term (current) use of inhaled steroids: Secondary | ICD-10-CM | POA: Insufficient documentation

## 2022-12-29 DIAGNOSIS — R112 Nausea with vomiting, unspecified: Secondary | ICD-10-CM | POA: Insufficient documentation

## 2022-12-29 MED ORDER — ONDANSETRON HCL 4 MG/2ML IJ SOLN
4.0000 mg | Freq: Once | INTRAMUSCULAR | Status: AC
  Administered 2022-12-30: 4 mg via INTRAVENOUS
  Filled 2022-12-29: qty 2

## 2022-12-29 NOTE — ED Triage Notes (Signed)
Patient arrived stating roughly 45 minutes ago began having constant lower abdominal pain, NV, and dark red stool.

## 2022-12-29 NOTE — ED Provider Triage Note (Signed)
Emergency Medicine Provider Triage Evaluation Note  Alan Sanders , a 52 y.o. male  was evaluated in triage.  Pt complains of lower abdominal pain that began 45 minutes ago.  Patient states he was at work when he went to the bathroom and noticed dark red blood in his stool.  Patient denied any melena or bright red blood or history of hemorrhoids or diverticulosis.  Patient is not on any blood thinners.  Patient is currently nauseous with nonbloody episodes of emesis.  Patient is a history of inguinal hernia and polyps found on previous colonoscopies.  Patient states pain is 10 out of 10 without radiation  Patient denied any syncope, chest pain, shortness of breath  Review of Systems  Positive: See HPI Negative: See HPI  Physical Exam  BP (!) 169/84 (BP Location: Right Arm)   Pulse (!) 108   Temp (!) 97.5 F (36.4 C) (Oral)   Resp 18   Ht '6\' 1"'$  (1.854 m)   Wt 103.4 kg   SpO2 98%   BMI 30.08 kg/m  Gen:   Awake, in obvious discomfort Resp:  Normal effort no signs of respiratory distress MSK:   Moves extremities without difficulty  Other:  Patient was able to range bilateral ankles, sensation intact distally in bilateral feet, 2+ bilateral dorsalis pedis, no noticeable skin color changes, patient tender throughout abdomen but exquisitely tender with guarding in the lower abdominal region  Medical Decision Making  Medically screening exam initiated at 11:56 PM.  Appropriate orders placed.  JAHDEN SCHARA was informed that the remainder of the evaluation will be completed by another provider, this initial triage assessment does not replace that evaluation, and the importance of remaining in the ED until their evaluation is complete.  Workup initiated and patient was brought back, patient was given Zofran for the nausea   Derold, Dorsch, PA-C 12/30/22 0000

## 2022-12-30 ENCOUNTER — Telehealth: Payer: Self-pay | Admitting: Internal Medicine

## 2022-12-30 ENCOUNTER — Emergency Department (HOSPITAL_COMMUNITY): Payer: Commercial Managed Care - PPO

## 2022-12-30 ENCOUNTER — Other Ambulatory Visit (HOSPITAL_COMMUNITY): Payer: Commercial Managed Care - PPO

## 2022-12-30 ENCOUNTER — Encounter (HOSPITAL_COMMUNITY): Payer: Self-pay

## 2022-12-30 DIAGNOSIS — R109 Unspecified abdominal pain: Secondary | ICD-10-CM | POA: Diagnosis not present

## 2022-12-30 LAB — BASIC METABOLIC PANEL
Anion gap: 11 (ref 5–15)
BUN: 6 mg/dL (ref 6–20)
CO2: 25 mmol/L (ref 22–32)
Calcium: 9.5 mg/dL (ref 8.9–10.3)
Chloride: 101 mmol/L (ref 98–111)
Creatinine, Ser: 0.95 mg/dL (ref 0.61–1.24)
GFR, Estimated: >60 mL/min (ref >60)
Glucose, Bld: 111 mg/dL — ABNORMAL HIGH (ref 70–99)
Potassium: 3.7 mmol/L (ref 3.5–5.1)
Sodium: 137 mmol/L (ref 135–145)

## 2022-12-30 LAB — COMPREHENSIVE METABOLIC PANEL
ALT: 42 U/L (ref 0–44)
AST: 36 U/L (ref 15–41)
Albumin: 4.2 g/dL (ref 3.5–5.0)
Alkaline Phosphatase: 67 U/L (ref 38–126)
Anion gap: 12 (ref 5–15)
BUN: 6 mg/dL (ref 6–20)
CO2: 25 mmol/L (ref 22–32)
Calcium: 9.3 mg/dL (ref 8.9–10.3)
Chloride: 101 mmol/L (ref 98–111)
Creatinine, Ser: 0.9 mg/dL (ref 0.61–1.24)
GFR, Estimated: >60 mL/min (ref >60)
Glucose, Bld: 82 mg/dL (ref 70–99)
Potassium: 3.3 mmol/L — ABNORMAL LOW (ref 3.5–5.1)
Sodium: 138 mmol/L (ref 135–145)
Total Bilirubin: 0.6 mg/dL (ref 0.3–1.2)
Total Protein: 7.6 g/dL (ref 6.5–8.1)

## 2022-12-30 LAB — CBC WITH DIFFERENTIAL/PLATELET
Abs Immature Granulocytes: 0.03 10*3/uL (ref 0.00–0.07)
Basophils Absolute: 0 10*3/uL (ref 0.0–0.1)
Basophils Relative: 1 %
Eosinophils Absolute: 0.2 10*3/uL (ref 0.0–0.5)
Eosinophils Relative: 2 %
HCT: 43.9 % (ref 39.0–52.0)
Hemoglobin: 14.8 g/dL (ref 13.0–17.0)
Immature Granulocytes: 0 %
Lymphocytes Relative: 17 %
Lymphs Abs: 1.4 10*3/uL (ref 0.7–4.0)
MCH: 27.8 pg (ref 26.0–34.0)
MCHC: 33.7 g/dL (ref 30.0–36.0)
MCV: 82.4 fL (ref 80.0–100.0)
Monocytes Absolute: 0.4 10*3/uL (ref 0.1–1.0)
Monocytes Relative: 5 %
Neutro Abs: 6.3 10*3/uL (ref 1.7–7.7)
Neutrophils Relative %: 75 %
Platelets: 205 10*3/uL (ref 150–400)
RBC: 5.33 MIL/uL (ref 4.22–5.81)
RDW: 14.2 % (ref 11.5–15.5)
WBC: 8.3 10*3/uL (ref 4.0–10.5)
nRBC: 0 % (ref 0.0–0.2)

## 2022-12-30 LAB — I-STAT CHEM 8, ED
BUN: 4 mg/dL — ABNORMAL LOW (ref 6–20)
Calcium, Ion: 1.17 mmol/L (ref 1.15–1.40)
Chloride: 100 mmol/L (ref 98–111)
Creatinine, Ser: 1.1 mg/dL (ref 0.61–1.24)
Glucose, Bld: 110 mg/dL — ABNORMAL HIGH (ref 70–99)
HCT: 42 % (ref 39.0–52.0)
Hemoglobin: 14.3 g/dL (ref 13.0–17.0)
Potassium: 3.6 mmol/L (ref 3.5–5.1)
Sodium: 140 mmol/L (ref 135–145)
TCO2: 27 mmol/L (ref 22–32)

## 2022-12-30 LAB — LIPASE, BLOOD: Lipase: 44 U/L (ref 11–51)

## 2022-12-30 MED ORDER — DICYCLOMINE HCL 20 MG PO TABS: 20.0000 mg | ORAL_TABLET | Freq: Two times a day (BID) | ORAL | 0 refills | Status: DC

## 2022-12-30 MED ORDER — SODIUM CHLORIDE 0.9 % IV BOLUS
1000.0000 mL | Freq: Once | INTRAVENOUS | Status: AC
  Administered 2022-12-30: 1000 mL via INTRAVENOUS

## 2022-12-30 MED ORDER — ONDANSETRON 4 MG PO TBDP: 4.0000 mg | ORAL_TABLET | Freq: Three times a day (TID) | ORAL | 0 refills | Status: DC | PRN

## 2022-12-30 MED ORDER — DIPHENHYDRAMINE HCL 25 MG PO CAPS
25.0000 mg | ORAL_CAPSULE | Freq: Once | ORAL | Status: AC
  Administered 2022-12-30: 25 mg via ORAL
  Filled 2022-12-30: qty 1

## 2022-12-30 MED ORDER — MORPHINE SULFATE (PF) 4 MG/ML IV SOLN
4.0000 mg | Freq: Once | INTRAVENOUS | Status: AC
  Administered 2022-12-30: 4 mg via INTRAVENOUS
  Filled 2022-12-30: qty 1

## 2022-12-30 MED ORDER — DICYCLOMINE HCL 10 MG PO CAPS
10.0000 mg | ORAL_CAPSULE | Freq: Once | ORAL | Status: AC
  Administered 2022-12-30: 10 mg via ORAL
  Filled 2022-12-30: qty 1

## 2022-12-30 MED ORDER — IOHEXOL 300 MG/ML  SOLN
100.0000 mL | Freq: Once | INTRAMUSCULAR | Status: AC | PRN
  Administered 2022-12-30: 100 mL via INTRAVENOUS

## 2022-12-30 MED ORDER — ONDANSETRON HCL 4 MG/2ML IJ SOLN
4.0000 mg | Freq: Once | INTRAMUSCULAR | Status: AC
  Administered 2022-12-30: 4 mg via INTRAVENOUS
  Filled 2022-12-30: qty 2

## 2022-12-30 MED ORDER — SODIUM CHLORIDE (PF) 0.9 % IJ SOLN
INTRAMUSCULAR | Status: AC
  Filled 2022-12-30: qty 50

## 2022-12-30 NOTE — Telephone Encounter (Signed)
GI ON CALL NOTE  Patient know to Dr. Candis Schatz for screening colonoscopy 03/2022. Multiple polyps.  He called tonight complaining of abdominal pain after eating. He was in the ER last night with abdominal pain and rectal bleeding. Reviewed. Had labs and CT. Sent home. Was to call our office today, but he forgot.  He wants to call office in am and be seen if possible. I will forward message to [facility]. I told him to return to ER for severe pain. He agreed.  Docia Chuck. Geri Seminole., M.D. [facility] Division of Gastroenterology

## 2022-12-30 NOTE — ED Provider Notes (Signed)
[name] Provider Note   CSN: [id number] Arrival date & time: 12/29/22  2336     History  Chief Complaint  Patient presents with   Rectal Bleeding    Alan Sanders is a 52 y.o. male.  HPI     52 year old male who presents with abdominal pain, diarrhea, bloody stools.  Patient reports that he was at work as a Presenter, broadcasting at [employer] when he began to have lower abdominal discomfort.  Describes it as sharp.  He went to the bathroom and had diarrhea.  He noted dark red stool.  No history of hemorrhoids or diverticulosis.  He is not on any blood thinners.  Reports nonbilious, nonbloody emesis.  Did have a colonoscopy approximately 4 months ago and had multiple polyps removed.  Had not had any post seizure complications.  Is due to have a repeat colonoscopy.  Reporting significant lower abdominal discomfort.  No dysuria.  No fevers.  Home Medications Prior to Admission medications   Medication Sig Start Date End Date Taking? Authorizing Provider  acetaminophen (TYLENOL) 500 MG tablet Take 1 tablet (500 mg total) by mouth every 6 (six) hours as needed. Patient taking differently: Take 500 mg by mouth every 6 (six) hours as needed for moderate pain. 10/25/22  Yes Redwine, Madison A, PA-C  albuterol (VENTOLIN HFA) 108 (90 Base) MCG/ACT inhaler Inhale 1-2 puffs into the lungs every 6 (six) hours as needed for wheezing or shortness of breath. 07/18/22  Yes Rudd, Lillette Boxer, MD  dicyclomine (BENTYL) 20 MG tablet Take 1 tablet (20 mg total) by mouth 2 (two) times daily. 12/30/22  Yes Savion Washam, Barbette Hair, MD  gabapentin (NEURONTIN) 300 MG capsule Take 1 capsule (300 mg total) by mouth 3 (three) times daily. 08/26/22  Yes Libby Maw, MD  loperamide (IMODIUM) 2 MG capsule Take 2 mg by mouth daily as needed for diarrhea or loose stools. 03/17/22  Yes [provider]  ondansetron (ZOFRAN-ODT) 4 MG disintegrating tablet Take 1 tablet (4  mg total) by mouth every 8 (eight) hours as needed for nausea or vomiting. 12/30/22  Yes Avari Nevares, Barbette Hair, MD  gabapentin (NEURONTIN) 100 MG capsule Take 1 capsule (100 mg total) by mouth every morning. Patient not taking: Reported on 12/30/2022 10/06/22   Dawley, Troy C, DO  gabapentin (NEURONTIN) 300 MG capsule Take 1 capsule (300 mg total) by mouth every evening. 10/06/22   Dawley, Troy C, DO  ibuprofen (ADVIL) 800 MG tablet Take 1 tablet (800 mg total) by mouth 3 (three) times daily. Patient not taking: Reported on 12/30/2022 10/25/22   Redwine, Madison A, PA-C  methocarbamol (ROBAXIN) 750 MG tablet Take 1 tablet (750 mg total) by mouth 3 (three) times daily as needed. Patient not taking: Reported on 12/30/2022 08/26/22     methocarbamol (ROBAXIN) 750 MG tablet Take 1 tablet (750 mg total) by mouth 3 (three) times daily as needed. Patient not taking: Reported on 12/30/2022 10/06/22     metoCLOPramide (REGLAN) 10 MG tablet Take 1 tablet (10 mg total) by mouth every 6 (six) hours as needed for nausea or vomiting. Patient not taking: Reported on 12/30/2022 08/20/22   Molpus, Jenny Reichmann, MD  ondansetron (ZOFRAN) 4 MG tablet Take 1 tablet (4 mg total) by mouth every 6 (six) hours. Patient not taking: Reported on 12/30/2022 10/25/22   Redwine, Madison A, PA-C  PARoxetine (PAXIL) 40 MG tablet Take 1 tablet (40 mg total) by mouth every morning. Patient  not taking: Reported on 12/30/2022 12/10/21   Libby Maw, MD  QUEtiapine (SEROQUEL) 25 MG tablet TAKE 1 TABLET BY MOUTH EVERYDAY AT BEDTIME Patient not taking: Reported on 12/30/2022 03/21/22   Libby Maw, MD  tiZANidine (ZANAFLEX) 4 MG tablet Take 1 tablet by mouth every 6 - 8 hours as needed **Do not exceed 3 doses in 24 hours Patient not taking: Reported on 12/30/2022 05/22/22         Allergies    Hydromorphone    Review of Systems   Review of Systems  Constitutional:  Negative for fever.  Gastrointestinal:  Positive for abdominal pain, blood in  stool, diarrhea, nausea and vomiting.  All other systems reviewed and are negative.   Physical Exam Updated Vital Signs BP (!) 128/96 (BP Location: Right Arm)   Pulse 75   Temp 97.7 F (36.5 C) (Oral)   Resp (!) 26   Ht 1.854 m ('6\' 1"'$ )   Wt 103.4 kg   SpO2 99%   BMI 30.08 kg/m  Physical Exam Vitals and nursing note reviewed.  Constitutional:      Appearance: He is well-developed. He is obese.     Comments: Uncomfortable appearing but ill-appearing  HENT:     Head: Normocephalic and atraumatic.  Eyes:     Pupils: Pupils are equal, round, and reactive to light.  Cardiovascular:     Rate and Rhythm: Normal rate and regular rhythm.     Heart sounds: Normal heart sounds. No murmur heard. Pulmonary:     Effort: Pulmonary effort is normal. No respiratory distress.     Breath sounds: Normal breath sounds. No wheezing.  Abdominal:     General: Bowel sounds are normal.     Palpations: Abdomen is soft.     Tenderness: There is abdominal tenderness. There is no rebound.     Comments: Tenderness palpation across the lower abdomen  Genitourinary:    Comments: No obvious bright red blood per rectum, no external hemorrhoids noted Musculoskeletal:     Cervical back: Neck supple.  Lymphadenopathy:     Cervical: No cervical adenopathy.  Skin:    General: Skin is warm and dry.  Neurological:     Mental Status: He is alert and oriented to person, place, and time.  Psychiatric:        Mood and Affect: Mood normal.     ED Results / Procedures / Treatments   Labs (all labs ordered are listed, but only abnormal results are displayed) Labs Reviewed  BASIC METABOLIC PANEL - Abnormal; Notable for the following components:      Result Value   Glucose, Bld 111 (*)    All other components within normal limits  COMPREHENSIVE METABOLIC PANEL - Abnormal; Notable for the following components:   Potassium 3.3 (*)    All other components within normal limits  I-STAT CHEM 8, ED - Abnormal;  Notable for the following components:   BUN 4 (*)    Glucose, Bld 110 (*)    All other components within normal limits  CBC WITH DIFFERENTIAL/PLATELET  LIPASE, BLOOD    EKG EKG Interpretation  Date/Time:  Tuesday December 30 2022 01:16:20 EST Ventricular Rate:  77 PR Interval:  150 QRS Duration: 82 QT Interval:  366 QTC Calculation: 415 R Axis:   71 Text Interpretation: Sinus rhythm Confirmed by Thayer Jew [phone]) on 12/30/2022 2:50:10 AM  Radiology CT Abdomen Pelvis W Contrast  Result Date: 12/30/2022 CLINICAL DATA:  Left lower quadrant pain. EXAM:  CT ABDOMEN AND PELVIS WITH CONTRAST TECHNIQUE: Multidetector CT imaging of the abdomen and pelvis was performed using the standard protocol following bolus administration of intravenous contrast. RADIATION DOSE REDUCTION: This exam was performed according to the departmental dose-optimization program which includes automated exposure control, adjustment of the mA and/or kV according to patient size and/or use of iterative reconstruction technique. CONTRAST:  133m OMNIPAQUE IOHEXOL 300 MG/ML  SOLN COMPARISON:  August 25, 2022 FINDINGS: Lower chest: Mild anteromedial right middle lobe and posterior bibasilar atelectasis is seen. Hepatobiliary: No focal liver abnormality is seen. No gallstones, gallbladder wall thickening, or biliary dilatation. Pancreas: Unremarkable. No pancreatic ductal dilatation or surrounding inflammatory changes. Spleen: Normal in size without focal abnormality. Adrenals/Urinary Tract: Adrenal glands are unremarkable. Kidneys are normal, without renal calculi, focal lesion, or hydronephrosis. Bladder is unremarkable. Stomach/Bowel: Stomach is within normal limits. Appendix appears normal. Dilated small bowel loops are seen within the mid left abdomen. A clear transition zone is not identified. Vascular/Lymphatic: No significant vascular findings are present. Subcentimeter mesenteric lymph nodes are seen within the mid and  upper abdomen. Reproductive: The prostate gland is normal in size. Mild prostate calcification is again seen. Other: No abdominal wall hernia or abnormality. Surgical coils are seen within the anterior aspect of the lower pelvis on the right. No abdominopelvic ascites. Musculoskeletal: No acute or significant osseous findings. IMPRESSION: 1. Dilated small bowel loops within the mid left abdomen which may be transient in nature. Sequelae associated with a partial small bowel obstruction cannot be excluded. 2. Mild right middle lobe and posterior bibasilar atelectasis. Electronically Signed   By: TVirgina NorfolkM.D.   On: 12/30/2022 02:44    Procedures Procedures    Medications Ordered in ED Medications  sodium chloride (PF) 0.9 % injection (has no administration in time range)  ondansetron (ZOFRAN) injection 4 mg (4 mg Intravenous Given 12/30/22 0032)  morphine (PF) 4 MG/ML injection 4 mg (4 mg Intravenous Given 12/30/22 0059)  ondansetron (ZOFRAN) injection 4 mg (4 mg Intravenous Given 12/30/22 0105)  sodium chloride 0.9 % bolus 1,000 mL (0 mLs Intravenous Stopped 12/30/22 0409)  iohexol (OMNIPAQUE) 300 MG/ML solution 100 mL (100 mLs Intravenous Contrast Given 12/30/22 0223)  diphenhydrAMINE (BENADRYL) capsule 25 mg (25 mg Oral Given 12/30/22 0114)  morphine (PF) 4 MG/ML injection 4 mg (4 mg Intravenous Given 12/30/22 0218)  dicyclomine (BENTYL) capsule 10 mg (10 mg Oral Given 12/30/22 0256)    ED Course/ Medical Decision Making/ A&P Clinical Course as of 12/30/22 0528  Tue Dec 30, 2022  0528 Patient significantly improved.  Has some persistent nausea but no ongoing vomiting.  No bloody stools.  Do not feel his clinical presentation is consistent with SBO.  He is tolerating fluids.  Will discharge with Zofran and Bentyl.  Recommend follow-up with his gastroenterologist. [CH]    Clinical Course User Index [CH] Carrera Kiesel, CBarbette Hair MD                             Medical Decision Making Amount and/or  Complexity of Data Reviewed Labs: ordered.  Risk Prescription drug management.   This patient presents to the ED for concern of nausea, vomiting, diarrhea, abdominal pain, blood in stool, this involves an extensive number of treatment options, and is a complaint that carries with it a high risk of complications and morbidity.  I considered the following differential and admission for this acute, potentially life threatening condition.  The differential  diagnosis includes diverticulitis, diverticulosis, colitis, gastroenteritis, hemorrhoids  MDM:    This is a 52 year old male who presents with abdominal pain, nausea, vomiting, diarrhea.  Reports nonbloody emesis.  States he had a dark red stool with diarrhea at work.  Predominantly on my exam he is complaining of abdominal discomfort.  He is overall nontoxic and vital signs are notable for tachycardia to 108.  Patient given fluids, pain and nausea medication.  Labs obtained and reviewed.  Hemoglobin normal.  No significant leukocytosis.  LFTs and lipase are normal.  CT scan with contrast was obtained.  Largely reassuring although he does have some dilated small loops of bowel thought to be transient in nature.  Cannot rule out partial SBO.  Patient is having bowel movements.  He clinically improved while in the emergency department.  He did not have any ongoing bleeding.  I doubt if acute lower GI bleed.  He is not on any anticoagulants and has been hemodynamically stable.  Could have a viral illness causing acute onset vomiting and diarrhea as well as abdominal cramping.  Will treat with Zofran and Bentyl.  Encouraged follow-up with gastroenterology.  If symptoms worsen, patient encouraged to return immediately.  (Labs, imaging, consults)  Labs: I Ordered, and personally interpreted labs.  The pertinent results include: CBC, CMP, lipase  Imaging Studies ordered: I ordered imaging studies including CT I independently visualized and interpreted  imaging. I agree with the radiologist interpretation  Additional history obtained from wife at bedside.  External records from outside source obtained and reviewed including endoscopy report  Cardiac Monitoring: The patient was maintained on a cardiac monitor.  If on the cardiac monitor, I personally viewed and interpreted the cardiac monitored which showed an underlying rhythm of: Sinus rhythm  Reevaluation: After the interventions noted above, I reevaluated the patient and found that they have :improved  Social Determinants of Health:  lives independently  Disposition: Discharge  Co morbidities that complicate the patient evaluation  Past Medical History:  Diagnosis Date   Anxiety    situational anxiety-on meds   Arthritis    neck/back/LEFT wrist   Asthma    PRN inhaler   Balance problem    Depression    on meds   Family history of colonic polyps 05/08/2022   Gait difficulty    GERD (gastroesophageal reflux disease)    OTC PRN meds   Headache    migraines   History of colonic polyps 05/08/2022   Inguinal hernia of left side without obstruction or gangrene    Pneumonia    Seasonal allergies    Seizures ([place])    " its been along time ,since my last seizure "   Spleen enlarged    Thrombocytopenia (H[facility])    Tobacco abuse      Medicines Meds ordered this encounter  Medications   ondansetron (ZOFRAN) injection 4 mg   morphine (PF) 4 MG/ML injection 4 mg   ondansetron (ZOFRAN) injection 4 mg   sodium chloride 0.9 % bolus 1,000 mL   sodium chloride (PF) 0.9 % injection    Angelica Chessman M: cabinet override   iohexol (OMNIPAQUE) 300 MG/ML solution 100 mL   diphenhydrAMINE (BENADRYL) capsule 25 mg   morphine (PF) 4 MG/ML injection 4 mg   dicyclomine (BENTYL) capsule 10 mg   ondansetron (ZOFRAN-ODT) 4 MG disintegrating tablet    Sig: Take 1 tablet (4 mg total) by mouth every 8 (eight) hours as needed for nausea or vomiting.    Dispense:  20 tablet    Refill:  0    dicyclomine (BENTYL) 20 MG tablet    Sig: Take 1 tablet (20 mg total) by mouth 2 (two) times daily.    Dispense:  20 tablet    Refill:  0    I have reviewed the patients home medicines and have made adjustments as needed  Problem List / ED Course: Problem List Items Addressed This Visit   None Visit Diagnoses     Abdominal pain, vomiting, and diarrhea    -  Primary                   Final Clinical Impression(s) / ED Diagnoses Final diagnoses:  Abdominal pain, vomiting, and diarrhea    Rx / DC Orders ED Discharge Orders          Ordered    ondansetron (ZOFRAN-ODT) 4 MG disintegrating tablet  Every 8 hours PRN        12/30/22 0526    dicyclomine (BENTYL) 20 MG tablet  2 times daily        12/30/22 0526              Akira Adelsberger, Barbette Hair, MD 12/30/22 [phone]

## 2022-12-30 NOTE — Discharge Instructions (Addendum)
You were seen today for vomiting and diarrhea.  Your hemoglobin today is reassuring.  There is no obvious bleeding on your exam.  Follow-up closely with your gastroenterologist.  Take Zofran and Bentyl as needed for your symptoms.  If you have new or worsening symptoms, you should be reevaluated.

## 2022-12-31 ENCOUNTER — Telehealth: Payer: Self-pay

## 2022-12-31 NOTE — Telephone Encounter (Signed)
Transition Care Management Unsuccessful Follow-up Telephone Call  Date of discharge and from where:  12/30/22 Abdominal pain, diarrhea, vomiting, rectal bleeding  Attempts:  1st Attempt  Reason for unsuccessful TCM follow-up call:  Left voice message

## 2022-12-31 NOTE — Telephone Encounter (Signed)
Left message for pt to call back  °

## 2023-01-02 NOTE — Telephone Encounter (Signed)
Transition Care Management Follow-up Telephone Call Date of discharge and from where: 12/30/22 Abdominal pain, diarrhea, vomiting, rectal bleeding How have you been since you were released from the hospital? My stomach is still hurting. I don't know what is going on. Any questions or concerns? No  Items Reviewed: Did the pt receive and understand the discharge instructions provided? Yes  Medications obtained and verified? No  Other? No  Any new allergies since your discharge? No  Dietary orders reviewed? Yes Do you have support at home? Yes    Follow up appointments reviewed:  PCP Hospital f/u appt confirmed? No  Scheduled to see - on - @ -. [facility] f/u appt confirmed? Yes  Scheduled to see Mr. Silverio Lay on 01/29/23 @ 10:00am. Are transportation arrangements needed? No  If their condition worsens, is the pt aware to call PCP or go to the Emergency Dept.? Yes Was the patient provided with contact information for the PCP's office or ED? Yes Was to pt encouraged to call back with questions or concerns? Yes   Angeline Slim, RN, BSN RN Clinical Supervisor [facility] [employer]

## 2023-01-02 NOTE — Telephone Encounter (Signed)
Transition Care Management Unsuccessful Follow-up Telephone Call  Date of discharge and from where:  12/30/22 Abdominal pain, diarrhea, vomiting, rectal bleeding   Attempts:  2nd Attempt  Reason for unsuccessful TCM follow-up call:  Left voice message

## 2023-01-03 ENCOUNTER — Emergency Department (HOSPITAL_COMMUNITY): Payer: Commercial Managed Care - PPO

## 2023-01-03 ENCOUNTER — Emergency Department (HOSPITAL_COMMUNITY)
Admission: EM | Admit: 2023-01-03 | Discharge: 2023-01-04 | Disposition: A | Discharge status: Home / Self Care | Payer: Commercial Managed Care - PPO | Attending: Emergency Medicine | Admitting: Emergency Medicine

## 2023-01-03 ENCOUNTER — Encounter (HOSPITAL_COMMUNITY): Payer: Self-pay

## 2023-01-03 DIAGNOSIS — R112 Nausea with vomiting, unspecified: Secondary | ICD-10-CM | POA: Insufficient documentation

## 2023-01-03 DIAGNOSIS — R161 Splenomegaly, not elsewhere classified: Secondary | ICD-10-CM | POA: Diagnosis not present

## 2023-01-03 DIAGNOSIS — R1084 Generalized abdominal pain: Secondary | ICD-10-CM | POA: Diagnosis not present

## 2023-01-03 DIAGNOSIS — R197 Diarrhea, unspecified: Secondary | ICD-10-CM | POA: Insufficient documentation

## 2023-01-03 DIAGNOSIS — R109 Unspecified abdominal pain: Secondary | ICD-10-CM

## 2023-01-03 DIAGNOSIS — R1030 Lower abdominal pain, unspecified: Secondary | ICD-10-CM | POA: Diagnosis not present

## 2023-01-03 DIAGNOSIS — K760 Fatty (change of) liver, not elsewhere classified: Secondary | ICD-10-CM | POA: Diagnosis not present

## 2023-01-03 LAB — URINALYSIS, ROUTINE W REFLEX MICROSCOPIC
Bilirubin Urine: NEGATIVE
Glucose, UA: NEGATIVE mg/dL
Hgb urine dipstick: NEGATIVE
Ketones, ur: NEGATIVE mg/dL
Leukocytes,Ua: NEGATIVE
Nitrite: NEGATIVE
Protein, ur: NEGATIVE mg/dL
Specific Gravity, Urine: 1.021 (ref 1.005–1.030)
pH: 5 (ref 5.0–8.0)

## 2023-01-03 LAB — CBC WITH DIFFERENTIAL/PLATELET
Abs Immature Granulocytes: 0.02 10*3/uL (ref 0.00–0.07)
Basophils Absolute: 0.1 10*3/uL (ref 0.0–0.1)
Basophils Relative: 1 %
Eosinophils Absolute: 0.2 10*3/uL (ref 0.0–0.5)
Eosinophils Relative: 2 %
HCT: 46.5 % (ref 39.0–52.0)
Hemoglobin: 15.3 g/dL (ref 13.0–17.0)
Immature Granulocytes: 0 %
Lymphocytes Relative: 18 %
Lymphs Abs: 1.4 10*3/uL (ref 0.7–4.0)
MCH: 27.7 pg (ref 26.0–34.0)
MCHC: 32.9 g/dL (ref 30.0–36.0)
MCV: 84.2 fL (ref 80.0–100.0)
Monocytes Absolute: 0.4 10*3/uL (ref 0.1–1.0)
Monocytes Relative: 5 %
Neutro Abs: 5.7 10*3/uL (ref 1.7–7.7)
Neutrophils Relative %: 74 %
Platelets: 146 10*3/uL — ABNORMAL LOW (ref 150–400)
RBC: 5.52 MIL/uL (ref 4.22–5.81)
RDW: 14.2 % (ref 11.5–15.5)
WBC: 7.7 10*3/uL (ref 4.0–10.5)
nRBC: 0 % (ref 0.0–0.2)

## 2023-01-03 LAB — COMPREHENSIVE METABOLIC PANEL
ALT: 46 U/L — ABNORMAL HIGH (ref 0–44)
AST: 41 U/L (ref 15–41)
Albumin: 4.8 g/dL (ref 3.5–5.0)
Alkaline Phosphatase: 72 U/L (ref 38–126)
Anion gap: 9 (ref 5–15)
BUN: 10 mg/dL (ref 6–20)
CO2: 25 mmol/L (ref 22–32)
Calcium: 9.3 mg/dL (ref 8.9–10.3)
Chloride: 104 mmol/L (ref 98–111)
Creatinine, Ser: 0.66 mg/dL (ref 0.61–1.24)
GFR, Estimated: >60 mL/min (ref >60)
Glucose, Bld: 86 mg/dL (ref 70–99)
Potassium: 3.8 mmol/L (ref 3.5–5.1)
Sodium: 138 mmol/L (ref 135–145)
Total Bilirubin: 0.7 mg/dL (ref 0.3–1.2)
Total Protein: 8.2 g/dL — ABNORMAL HIGH (ref 6.5–8.1)

## 2023-01-03 LAB — LIPASE, BLOOD: Lipase: 41 U/L (ref 11–51)

## 2023-01-03 MED ORDER — IOHEXOL 9 MG/ML PO SOLN: 500.0000 mL | ORAL | Status: DC

## 2023-01-03 MED ORDER — SODIUM CHLORIDE 0.9 % IV BOLUS
1000.0000 mL | Freq: Once | INTRAVENOUS | Status: AC
  Administered 2023-01-03: 1000 mL via INTRAVENOUS

## 2023-01-03 MED ORDER — IOHEXOL 300 MG/ML  SOLN
100.0000 mL | Freq: Once | INTRAMUSCULAR | Status: AC | PRN
  Administered 2023-01-03: 100 mL via INTRAVENOUS

## 2023-01-03 MED ORDER — SODIUM CHLORIDE 0.9 % IV SOLN: INTRAVENOUS | Status: DC

## 2023-01-03 MED ORDER — MORPHINE SULFATE (PF) 4 MG/ML IV SOLN
4.0000 mg | Freq: Once | INTRAVENOUS | Status: AC
  Administered 2023-01-03: 4 mg via INTRAVENOUS
  Filled 2023-01-03: qty 1

## 2023-01-03 MED ORDER — ONDANSETRON HCL 4 MG/2ML IJ SOLN
4.0000 mg | Freq: Once | INTRAMUSCULAR | Status: AC
  Administered 2023-01-03: 4 mg via INTRAVENOUS
  Filled 2023-01-03: qty 2

## 2023-01-03 MED ORDER — IOHEXOL 9 MG/ML PO SOLN
ORAL | Status: AC
  Filled 2023-01-03: qty 1000

## 2023-01-03 NOTE — ED Triage Notes (Signed)
Pt presents with c/o abdominal pain for several weeks with associated diarrhea. Pt reports he was recently here for the same.

## 2023-01-03 NOTE — ED Provider Notes (Signed)
[place] EMERGENCY DEPARTMENT AT [facility] Provider Note   CSN: [id number] Arrival date & time: 01/03/23  1849     History {Add pertinent medical, surgical, social history, OB history to HPI:1} Chief Complaint  Patient presents with   Abdominal Pain    Alan Sanders is a 52 y.o. male.   Abdominal Pain    Patient has a history of thrombocytopenia, reflux, seizures, pneumonia, hernia who presents to the ED with complaints of recurrent abdominal pain and diarrhea.  Patient states he has been having symptoms for a couple of weeks.  He has very foul-smelling diarrhea.  He has noted some intermittent episodes of blood in the stool although not recently.Patient has had an occasional episode of nausea and vomiting.  Patient was seen in the ED recently for similar symptoms.  On February 6 he had a CT scan of the abdomen pelvis that did show dilated small loops within the mid abdomen which could either be transient or partial small bowel obstruction.  Patient was discharged and returns today because of increasing pain.  Home Medications Prior to Admission medications   Medication Sig Start Date End Date Taking? Authorizing Provider  acetaminophen (TYLENOL) 500 MG tablet Take 1 tablet (500 mg total) by mouth every 6 (six) hours as needed. Patient taking differently: Take 500 mg by mouth every 6 (six) hours as needed for moderate pain. 10/25/22   Redwine, Madison A, PA-C  albuterol (VENTOLIN HFA) 108 (90 Base) MCG/ACT inhaler Inhale 1-2 puffs into the lungs every 6 (six) hours as needed for wheezing or shortness of breath. 07/18/22   Haydee Salter, MD  dicyclomine (BENTYL) 20 MG tablet Take 1 tablet (20 mg total) by mouth 2 (two) times daily. 12/30/22   Horton, Barbette Hair, MD  gabapentin (NEURONTIN) 100 MG capsule Take 1 capsule (100 mg total) by mouth every morning. Patient not taking: Reported on 12/30/2022 10/06/22   Dawley, Troy C, DO  gabapentin (NEURONTIN) 300 MG capsule Take 1  capsule (300 mg total) by mouth 3 (three) times daily. 08/26/22   Libby Maw, MD  gabapentin (NEURONTIN) 300 MG capsule Take 1 capsule (300 mg total) by mouth every evening. 10/06/22   Dawley, Troy C, DO  ibuprofen (ADVIL) 800 MG tablet Take 1 tablet (800 mg total) by mouth 3 (three) times daily. Patient not taking: Reported on 12/30/2022 10/25/22   Redwine, Madison A, PA-C  loperamide (IMODIUM) 2 MG capsule Take 2 mg by mouth daily as needed for diarrhea or loose stools. 03/17/22   [provider]  methocarbamol (ROBAXIN) 750 MG tablet Take 1 tablet (750 mg total) by mouth 3 (three) times daily as needed. Patient not taking: Reported on 12/30/2022 08/26/22     methocarbamol (ROBAXIN) 750 MG tablet Take 1 tablet (750 mg total) by mouth 3 (three) times daily as needed. Patient not taking: Reported on 12/30/2022 10/06/22     metoCLOPramide (REGLAN) 10 MG tablet Take 1 tablet (10 mg total) by mouth every 6 (six) hours as needed for nausea or vomiting. Patient not taking: Reported on 12/30/2022 08/20/22   Molpus, Jenny Reichmann, MD  ondansetron (ZOFRAN) 4 MG tablet Take 1 tablet (4 mg total) by mouth every 6 (six) hours. Patient not taking: Reported on 12/30/2022 10/25/22   Redwine, Madison A, PA-C  ondansetron (ZOFRAN-ODT) 4 MG disintegrating tablet Take 1 tablet (4 mg total) by mouth every 8 (eight) hours as needed for nausea or vomiting. 12/30/22   Horton, Barbette Hair, MD  PARoxetine (PAXIL) 40 MG tablet Take 1 tablet (40 mg total) by mouth every morning. Patient not taking: Reported on 12/30/2022 12/10/21   Libby Maw, MD  QUEtiapine (SEROQUEL) 25 MG tablet TAKE 1 TABLET BY MOUTH EVERYDAY AT BEDTIME Patient not taking: Reported on 12/30/2022 03/21/22   Libby Maw, MD  tiZANidine (ZANAFLEX) 4 MG tablet Take 1 tablet by mouth every 6 - 8 hours as needed **Do not exceed 3 doses in 24 hours Patient not taking: Reported on 12/30/2022 05/22/22         Allergies    Hydromorphone    Review of  Systems   Review of Systems  Gastrointestinal:  Positive for abdominal pain.    Physical Exam Updated Vital Signs BP (!) 161/97 (BP Location: Left Arm)   Pulse 95   Temp 97.6 F (36.4 C) (Oral)   Resp 20   SpO2 97%  Physical Exam Vitals and nursing note reviewed.  Constitutional:      Appearance: He is well-developed. He is not diaphoretic.  HENT:     Head: Normocephalic and atraumatic.     Right Ear: External ear normal.     Left Ear: External ear normal.  Eyes:     General: No scleral icterus.       Right eye: No discharge.        Left eye: No discharge.     Conjunctiva/sclera: Conjunctivae normal.  Neck:     Trachea: No tracheal deviation.  Cardiovascular:     Rate and Rhythm: Normal rate and regular rhythm.  Pulmonary:     Effort: Pulmonary effort is normal. No respiratory distress.     Breath sounds: Normal breath sounds. No stridor. No wheezing or rales.  Abdominal:     General: Bowel sounds are normal. There is no distension.     Palpations: Abdomen is soft.     Tenderness: There is generalized abdominal tenderness. There is no guarding or rebound.  Musculoskeletal:        General: No tenderness or deformity.     Cervical back: Neck supple.  Skin:    General: Skin is warm and dry.     Findings: No rash.  Neurological:     General: No focal deficit present.     Mental Status: He is alert.     Cranial Nerves: No cranial nerve deficit, dysarthria or facial asymmetry.     Sensory: No sensory deficit.     Motor: No abnormal muscle tone or seizure activity.     Coordination: Coordination normal.  Psychiatric:        Mood and Affect: Mood normal.     ED Results / Procedures / Treatments   Labs (all labs ordered are listed, but only abnormal results are displayed) Labs Reviewed - No data to display  EKG None  Radiology No results found.  Procedures Procedures  {Document cardiac monitor, telemetry assessment procedure when  appropriate:1}  Medications Ordered in ED Medications - No data to display  ED Course/ Medical Decision Making/ A&P   {   Click here for ABCD2, HEART and other calculatorsREFRESH Note before signing :1}                          Medical Decision Making  ***  {Document critical care time when appropriate:1} {Document review of labs and clinical decision tools ie heart score, Chads2Vasc2 etc:1}  {Document your independent review of radiology images, and any outside records:1} {  Document your discussion with family members, caretakers, and with consultants:1} {Document social determinants of health affecting pt's care:1} {Document your decision making why or why not admission, treatments were needed:1} Final Clinical Impression(s) / ED Diagnoses Final diagnoses:  None    Rx / DC Orders ED Discharge Orders     None

## 2023-01-03 NOTE — ED Notes (Signed)
Pt. In X-ray. ?

## 2023-01-04 MED ORDER — NAPROXEN 375 MG PO TABS: 375.0000 mg | ORAL_TABLET | Freq: Two times a day (BID) | ORAL | 0 refills | Status: DC

## 2023-01-04 MED ORDER — DICYCLOMINE HCL 20 MG PO TABS: 20.0000 mg | ORAL_TABLET | Freq: Two times a day (BID) | ORAL | 0 refills | Status: DC

## 2023-01-04 NOTE — Discharge Instructions (Addendum)
Take the medications as prescribed to help with your abdominal pain and discomfort.  Follow-up with your primary doctor or GI doctor for further evaluation.

## 2023-01-12 ENCOUNTER — Telehealth: Payer: Self-pay

## 2023-01-12 ENCOUNTER — Encounter: Payer: Self-pay | Admitting: Family Medicine

## 2023-01-12 ENCOUNTER — Ambulatory Visit: Payer: Commercial Managed Care - PPO | Admitting: Family Medicine

## 2023-01-12 ENCOUNTER — Other Ambulatory Visit (HOSPITAL_COMMUNITY): Payer: Self-pay

## 2023-01-12 VITALS — BP 110/80 | HR 83 | Temp 97.9°F | Ht 73.0 in | Wt 210.6 lb

## 2023-01-12 DIAGNOSIS — R1084 Generalized abdominal pain: Secondary | ICD-10-CM | POA: Diagnosis not present

## 2023-01-12 DIAGNOSIS — R195 Other fecal abnormalities: Secondary | ICD-10-CM | POA: Diagnosis not present

## 2023-01-12 DIAGNOSIS — F341 Dysthymic disorder: Secondary | ICD-10-CM

## 2023-01-12 LAB — URINALYSIS, ROUTINE W REFLEX MICROSCOPIC
Hgb urine dipstick: NEGATIVE
Ketones, ur: NEGATIVE
Leukocytes,Ua: NEGATIVE
Nitrite: NEGATIVE
RBC / HPF: NONE SEEN (ref 0–?)
Specific Gravity, Urine: >=1.03 — AB (ref 1.000–1.030)
Total Protein, Urine: NEGATIVE
Urine Glucose: NEGATIVE
Urobilinogen, UA: 0.2 (ref 0.0–1.0)
pH: 5.5 (ref 5.0–8.0)

## 2023-01-12 LAB — CBC WITH DIFFERENTIAL/PLATELET
Basophils Absolute: 0 10*3/uL (ref 0.0–0.1)
Basophils Relative: 0.8 % (ref 0.0–3.0)
Eosinophils Absolute: 0.2 10*3/uL (ref 0.0–0.7)
Eosinophils Relative: 3.8 % (ref 0.0–5.0)
HCT: 48.8 % (ref 39.0–52.0)
Hemoglobin: 16.6 g/dL (ref 13.0–17.0)
Lymphocytes Relative: 23.7 % (ref 12.0–46.0)
Lymphs Abs: 1.4 10*3/uL (ref 0.7–4.0)
MCHC: 34 g/dL (ref 30.0–36.0)
MCV: 82.4 fl (ref 78.0–100.0)
Monocytes Absolute: 0.3 10*3/uL (ref 0.1–1.0)
Monocytes Relative: 5.6 % (ref 3.0–12.0)
Neutro Abs: 3.8 10*3/uL (ref 1.4–7.7)
Neutrophils Relative %: 66.1 % (ref 43.0–77.0)
Platelets: 128 10*3/uL — ABNORMAL LOW (ref 150.0–400.0)
RBC: 5.92 Mil/uL — ABNORMAL HIGH (ref 4.22–5.81)
RDW: 14.9 % (ref 11.5–15.5)
WBC: 5.8 10*3/uL (ref 4.0–10.5)

## 2023-01-12 LAB — BASIC METABOLIC PANEL
BUN: 8 mg/dL (ref 6–23)
CO2: 30 mEq/L (ref 19–32)
Calcium: 9.9 mg/dL (ref 8.4–10.5)
Chloride: 99 mEq/L (ref 96–112)
Creatinine, Ser: 0.81 mg/dL (ref 0.40–1.50)
GFR: 101.92 mL/min (ref >60.00)
Glucose, Bld: 108 mg/dL — ABNORMAL HIGH (ref 70–99)
Potassium: 4.1 mEq/L (ref 3.5–5.1)
Sodium: 137 mEq/L (ref 135–145)

## 2023-01-12 LAB — HEPATIC FUNCTION PANEL
ALT: 48 U/L (ref 0–53)
AST: 40 U/L — ABNORMAL HIGH (ref 0–37)
Albumin: 4.9 g/dL (ref 3.5–5.2)
Alkaline Phosphatase: 85 U/L (ref 39–117)
Bilirubin, Direct: 0.2 mg/dL (ref 0.0–0.3)
Total Bilirubin: 0.9 mg/dL (ref 0.2–1.2)
Total Protein: 7.8 g/dL (ref 6.0–8.3)

## 2023-01-12 LAB — AMYLASE: Amylase: 31 U/L (ref 27–131)

## 2023-01-12 LAB — LIPASE: Lipase: 24 U/L (ref 11.0–59.0)

## 2023-01-12 MED ORDER — PAROXETINE HCL 40 MG PO TABS
40.0000 mg | ORAL_TABLET | ORAL | 1 refills | Status: DC
  Filled 2023-01-12: qty 90, 90d supply, fill #0

## 2023-01-12 NOTE — Telephone Encounter (Signed)
PAPERWORK/FORMS received  Dropped off by: Cecilio Asper (during [place] with Ethelene Hal) Call back #: [phone] Individual made aware of 3-5 business day turn around (YES/NO): Y GREEN charge sheet completed and patient made aware of possible charge (YES/NO): Y Placed in provider folder at front desk. ~~~ route to CMA/provider Team  CLINICAL USE BELOW THIS LINE (use X to signify action taken)  _X_ Form received and placed in providers office for signature. ___ Form completed and faxed to LOA Dept.  ___ Form completed & LVM to notify patient ready for pick up.  ___ Charge sheet and copy of form in front office folder for office supervisor.

## 2023-01-12 NOTE — Addendum Note (Signed)
Addended by: Jon Billings on: 01/12/2023 02:30 PM   Modules accepted: Orders

## 2023-01-12 NOTE — Progress Notes (Addendum)
Established Patient Office Visit   Subjective:  Patient ID: Alan Sanders, male    DOB: [birth year]  Age: 52 y.o. MRN: [id number]  Chief Complaint  Patient presents with   Diarrhea   GI Problem    Diarrhea  Pertinent negatives include no abdominal pain, myalgias or vomiting.  GI Problem The primary symptoms include diarrhea. Primary symptoms do not include abdominal pain, nausea, vomiting, melena, myalgias or rash.  The illness does not include constipation.   Encounter Diagnoses  Name Primary?   Generalized abdominal pain Yes   Loose stools    Dysthymia    51-monthhistory of migratory abdominal pain associated with loose stools.  There has been an 18 pound weight loss.  He is seeing blood in his stool only once.  There is been no pus.  There have been no night sweats.  Pain has been in his lower abdomen lately.  He does not drink alcohol.  Distant history left inguinal surgery.  Status post colonoscopy several months ago.  Several polyps were removed.  He was on a 317-monthollow-up call but that did not happen because he was hospitalized.  Follow-up scheduled with GI on March 7.  Accompanied by his wife today.   Review of Systems  Constitutional: Negative.   HENT: Negative.    Eyes:  Negative for blurred vision, discharge and redness.  Respiratory: Negative.    Cardiovascular: Negative.   Gastrointestinal:  Positive for diarrhea. Negative for abdominal pain, blood in stool, constipation, heartburn, melena, nausea and vomiting.  Genitourinary: Negative.   Musculoskeletal: Negative.  Negative for myalgias.  Skin:  Negative for rash.  Neurological:  Negative for tingling, loss of consciousness and weakness.  Endo/Heme/Allergies:  Negative for polydipsia.     Current Outpatient Medications:    acetaminophen (TYLENOL) 500 MG tablet, Take 1 tablet (500 mg total) by mouth every 6 (six) hours as needed., Disp: 30 tablet, Rfl: 0   albuterol (VENTOLIN HFA) 108 (90 Base) MCG/ACT  inhaler, Inhale 1-2 puffs into the lungs every 6 (six) hours as needed for wheezing or shortness of breath., Disp: 6.7 g, Rfl: 2   dicyclomine (BENTYL) 20 MG tablet, Take 1 tablet (20 mg total) by mouth 2 (two) times daily., Disp: 20 tablet, Rfl: 0   gabapentin (NEURONTIN) 100 MG capsule, Take 1 capsule (100 mg total) by mouth every morning., Disp: 90 capsule, Rfl: 2   gabapentin (NEURONTIN) 300 MG capsule, Take 1 capsule (300 mg total) by mouth 3 (three) times daily. (Patient taking differently: Take 300 mg by mouth at bedtime.), Disp: 90 capsule, Rfl: 0   methocarbamol (ROBAXIN) 750 MG tablet, Take 1 tablet (750 mg total) by mouth 3 (three) times daily as needed., Disp: 60 tablet, Rfl: 1   methocarbamol (ROBAXIN) 750 MG tablet, Take 1 tablet (750 mg total) by mouth 3 (three) times daily as needed. (Patient taking differently: Take 750 mg by mouth at bedtime.), Disp: 90 tablet, Rfl: 2   ondansetron (ZOFRAN) 4 MG tablet, Take 1 tablet (4 mg total) by mouth every 6 (six) hours., Disp: 12 tablet, Rfl: 0   ondansetron (ZOFRAN-ODT) 4 MG disintegrating tablet, Take 1 tablet (4 mg total) by mouth every 8 (eight) hours as needed for nausea or vomiting., Disp: 20 tablet, Rfl: 0   tiZANidine (ZANAFLEX) 4 MG tablet, Take 1 tablet by mouth every 6 - 8 hours as needed **Do not exceed 3 doses in 24 hours, Disp: 90 tablet, Rfl: 2   gabapentin (NEURONTIN) 300  MG capsule, Take 1 capsule (300 mg total) by mouth every evening., Disp: 90 capsule, Rfl: 2   ibuprofen (ADVIL) 800 MG tablet, Take 1 tablet (800 mg total) by mouth 3 (three) times daily. (Patient not taking: Reported on 12/30/2022), Disp: 21 tablet, Rfl: 0   metoCLOPramide (REGLAN) 10 MG tablet, Take 1 tablet (10 mg total) by mouth every 6 (six) hours as needed for nausea or vomiting., Disp: 12 tablet, Rfl: 0   naproxen (NAPROSYN) 375 MG tablet, Take 1 tablet (375 mg total) by mouth 2 (two) times daily. (Patient not taking: Reported on 01/12/2023), Disp: 20 tablet,  Rfl: 0   PARoxetine (PAXIL) 40 MG tablet, Take 1 tablet (40 mg total) by mouth every morning., Disp: 90 tablet, Rfl: 1   QUEtiapine (SEROQUEL) 25 MG tablet, TAKE 1 TABLET BY MOUTH EVERYDAY AT BEDTIME (Patient not taking: Reported on 12/30/2022), Disp: 90 tablet, Rfl: 1   Objective:     BP 110/80 (BP Location: Right Arm, Patient Position: Sitting)   Pulse 83   Temp 97.9 F (36.6 C) (Temporal)   Ht 6' 1"$  (1.854 m)   Wt 210 lb 9.6 oz (95.5 kg)   SpO2 96%   BMI 27.79 kg/m  Wt Readings from Last 3 Encounters:  01/12/23 210 lb 9.6 oz (95.5 kg)  12/29/22 228 lb (103.4 kg)  10/25/22 228 lb (103.4 kg)      Physical Exam Constitutional:      General: He is not in acute distress.    Appearance: Normal appearance. He is not ill-appearing, toxic-appearing or diaphoretic.  HENT:     Head: Normocephalic and atraumatic.     Right Ear: External ear normal.     Left Ear: External ear normal.     Mouth/Throat:     Mouth: Mucous membranes are moist.     Pharynx: Oropharynx is clear. No oropharyngeal exudate or posterior oropharyngeal erythema.  Eyes:     General: No scleral icterus.       Right eye: No discharge.        Left eye: No discharge.     Extraocular Movements: Extraocular movements intact.     Conjunctiva/sclera: Conjunctivae normal.     Pupils: Pupils are equal, round, and reactive to light.  Cardiovascular:     Rate and Rhythm: Normal rate and regular rhythm.  Pulmonary:     Effort: Pulmonary effort is normal. No respiratory distress.     Breath sounds: Normal breath sounds.  Abdominal:     General: Bowel sounds are normal.     Tenderness: There is no abdominal tenderness. There is no guarding.     Hernia: A hernia is present. Hernia is present in the umbilical area.     Comments: There is midepigastric tenderness to palpation as well as lower abdominal palpation.  No rebound or guarding.  Musculoskeletal:     Cervical back: No rigidity or tenderness.  Skin:    General:  Skin is warm and dry.  Neurological:     Mental Status: He is alert and oriented to person, place, and time.  Psychiatric:        Mood and Affect: Mood normal.        Behavior: Behavior normal.      No results found for any visits on 01/12/23.    The 10-year ASCVD risk score (Arnett DK, et al., 2019) is: 8.2%    Assessment & Plan:   Generalized abdominal pain -     CBC with Differential/Platelet -  Basic metabolic panel -     Amylase -     Hepatic function panel -     Urinalysis, Routine w reflex microscopic -     Lipase -     Multiple Myeloma Panel (SPEP&IFE w/QIG)  Loose stools  Dysthymia -     PARoxetine HCl; Take 1 tablet (40 mg total) by mouth every morning.  Dispense: 90 tablet; Refill: 1    Return in 3 months (on 04/12/2023), or Follow-up with GI as planned on the seventh.Libby Maw, MD

## 2023-01-13 ENCOUNTER — Other Ambulatory Visit (HOSPITAL_COMMUNITY): Payer: Self-pay

## 2023-01-13 ENCOUNTER — Other Ambulatory Visit: Payer: Self-pay

## 2023-01-16 ENCOUNTER — Telehealth: Payer: Self-pay | Admitting: Gastroenterology

## 2023-01-16 NOTE — Telephone Encounter (Signed)
Wife states pt was up all night with N&V, requesting to be seen sooner. Pt scheduled to see Alonza Bogus PA 01/22/23 at 10am. Wife aware of appt, appt in March cancelled.

## 2023-01-16 NOTE — Telephone Encounter (Signed)
Inbound call from patients wife stating that patient was up all night with vomiting and diarrhea. Patient is currently scheduled to see Vicie Mutters on 3/7, but is requesting a call back to discuss. Pleas advise.

## 2023-01-20 LAB — MULTIPLE MYELOMA PANEL, SERUM
Albumin SerPl Elph-Mcnc: 4.7 g/dL — ABNORMAL HIGH (ref 2.9–4.4)
Albumin/Glob SerPl: 1.7 (ref 0.7–1.7)
Alpha 1: 0.2 g/dL (ref 0.0–0.4)
Alpha2 Glob SerPl Elph-Mcnc: 0.6 g/dL (ref 0.4–1.0)
B-Globulin SerPl Elph-Mcnc: 1 g/dL (ref 0.7–1.3)
Gamma Glob SerPl Elph-Mcnc: 1.1 g/dL (ref 0.4–1.8)
Globulin, Total: 2.9 g/dL (ref 2.2–3.9)
IgA/Immunoglobulin A, Serum: 216 mg/dL (ref 90–386)
IgG (Immunoglobin G), Serum: 1045 mg/dL (ref 603–1613)
IgM (Immunoglobulin M), Srm: 211 mg/dL — ABNORMAL HIGH (ref 20–172)
Total Protein: 7.6 g/dL (ref 6.0–8.5)

## 2023-01-22 ENCOUNTER — Ambulatory Visit: Payer: Commercial Managed Care - PPO | Admitting: Gastroenterology

## 2023-01-22 ENCOUNTER — Other Ambulatory Visit: Payer: Self-pay

## 2023-01-22 ENCOUNTER — Encounter: Payer: Self-pay | Admitting: Gastroenterology

## 2023-01-22 ENCOUNTER — Other Ambulatory Visit (INDEPENDENT_AMBULATORY_CARE_PROVIDER_SITE_OTHER): Payer: Commercial Managed Care - PPO

## 2023-01-22 VITALS — BP 120/80 | HR 75 | Ht 73.0 in | Wt 206.0 lb

## 2023-01-22 DIAGNOSIS — R112 Nausea with vomiting, unspecified: Secondary | ICD-10-CM

## 2023-01-22 DIAGNOSIS — Z860100 Personal history of colon polyps, unspecified: Secondary | ICD-10-CM | POA: Insufficient documentation

## 2023-01-22 DIAGNOSIS — R197 Diarrhea, unspecified: Secondary | ICD-10-CM | POA: Diagnosis not present

## 2023-01-22 DIAGNOSIS — R634 Abnormal weight loss: Secondary | ICD-10-CM | POA: Diagnosis not present

## 2023-01-22 LAB — TSH: TSH: 3.71 u[IU]/mL (ref 0.35–5.50)

## 2023-01-22 MED ORDER — NA SULFATE-K SULFATE-MG SULF 17.5-3.13-1.6 GM/177ML PO SOLN
1.0000 | Freq: Once | ORAL | 0 refills | Status: AC
  Filled 2023-01-22: qty 354, 1d supply, fill #0

## 2023-01-22 MED ORDER — METOCLOPRAMIDE HCL 10 MG PO TABS
10.0000 mg | ORAL_TABLET | Freq: Four times a day (QID) | ORAL | 1 refills | Status: DC | PRN
  Filled 2023-01-22: qty 30, 8d supply, fill #0

## 2023-01-22 NOTE — Progress Notes (Signed)
01/22/2023 Alan Sanders:[id number] 1971-01-07   HISTORY OF PRESENT ILLNESS: This is a 52 year old male is a patient of Dr. Dayle Points known to him only for colonoscopy in May 2023 as below had multiple large adenomatous polyps removed.  He today with several complaints.  Reports that he has been having diarrhea for several months since his last colonoscopy.  Also reports nausea, vomiting, diffuse abdominal pain.  He describes all of this in addition to the diarrhea.  Reports an 18 pound weight loss in the past couple of months.  Nausea, vomiting, abdominal pain have been more recent over the past 3 to 4 months, diarrhea has been longer.  Recent CT scan abdomen and pelvis with contrast on 2/6 suggested some dilated small bowel loops, but repeat scan 4 days later showed resolution and no acute findings.  He says that overall he just feels bad.  Has been out on FMLA from work intermittently the symptoms.  Says that he just has no appetite because he feels better when he eats.  Had one episode of rectal bleeding in the toilet bowl throughout all this, but it resolved and has not recurred.  Has dicyclomine that he can use as needed for abdominal pain.  Colonoscopy 03/2022: - Ten 4 to 8 mm polyps at the recto-sigmoid colon, in the descending colon, at the splenic flexure, in the transverse colon and at the hepatic flexure, removed with a cold snare. Resected and retrieved. - One 20 mm polyp in the distal transverse colon, removed using injection-lift and a hot snare. Resected and retrieved. Clips (MR conditional) were placed. Tattooed. - One 15 mm polyp in the sigmoid colon, removed with a hot snare. Resected and retrieved. Biopsied. - One 8 mm polyp in the sigmoid colon, removed with a cold snare. Resected and retrieved. - The examined portion of the ileum was normal. - The distal rectum and anal verge are normal on retroflexion view.  1. Surgical [P], colon, recto-sigmoid, descending, splenic  flexure, transverse, and hepatic flexure, polyp (10) - TUBULAR ADENOMAS, MULTIPLE ADENOMATOUS FRAGMENTS, NEGATIVE FOR HIGH-GRADE DYSPLASIA. 2. Surgical [P], colon, transverse, polyp (1) - TUBULAR ADENOMA, NEGATIVE FOR HIGH-GRADE DYSPLASIA. 3. Surgical [P], colon, sigmoid, polyp (1) - TUBULAR ADENOMA, NEGATIVE FOR HIGH-GRADE DYSPLASIA. 4. Surgical [P], colon, sigmoid, polyp (1) - TUBULOVILLOUS ADENOMA, NEGATIVE FOR HIGH-GRADE DYSPLASIA, PEDUNCULATED (STALK MARGIN NEGATIVE FOR ADENOMATOUS CHANGE). 5. Surgical [P], colon, sigmoid, polyp (1) SUGGESTIVE OF GOBLET CELL-RICH HYPERPLASTIC POLYP.  Repeat recommended in 6 months.  Past Medical History:  Diagnosis Date   Anxiety    situational anxiety-on meds   Arthritis    neck/back/LEFT wrist   Asthma    PRN inhaler   Balance problem    Depression    on meds   Family history of colonic polyps 05/08/2022   Gait difficulty    GERD (gastroesophageal reflux disease)    OTC PRN meds   Headache    migraines   History of colonic polyps 05/08/2022   Inguinal hernia of left side without obstruction or gangrene    Pneumonia    Seasonal allergies    Seizures ([place])    " its been along time ,since my last seizure "   Spleen enlarged    Thrombocytopenia ([place])    Tobacco abuse    Past Surgical History:  Procedure Laterality Date   ANTERIOR CERVICAL DECOMP/DISCECTOMY FUSION N/A 04/16/2021   Procedure: Cervical three-four  Anterior cervical decompression/discectomy/fusion;  Surgeon: Karsten Ro, DO;  Location: [facility]  OR;  Service: Neurosurgery;  Laterality: N/A;   COLONOSCOPY  03/2022   Martinez-MAC-suprep(adeq)-many TA's-1 piecemeal   INGUINAL HERNIA REPAIR Right 2006   POLYPECTOMY  03/2022   many TA's-1 piecemeal   SURAL NERVE BX Right 12/11/2020   Procedure: SURAL NERVE BIOPSY;  Surgeon: Karsten Ro, DO;  Location: [facility];  Service: Neurosurgery;  Laterality: Right;    reports that he has been smoking cigarettes. He has a 12.00 pack-year  smoking history. His smokeless tobacco use includes snuff. He reports that he does not drink alcohol and does not use drugs. family history includes Alcoholism in his brother; COPD in his mother; Colon polyps in his father; Diabetes in his mother; Hypertension in his mother. Allergies  Allergen Reactions   Hydromorphone Itching and Rash      Outpatient Encounter Medications as of 01/22/2023  Medication Sig   albuterol (VENTOLIN HFA) 108 (90 Base) MCG/ACT inhaler Inhale 1-2 puffs into the lungs every 6 (six) hours as needed for wheezing or shortness of breath.   gabapentin (NEURONTIN) 300 MG capsule Take 1 capsule (300 mg total) by mouth 3 (three) times daily. (Patient taking differently: Take 300 mg by mouth at bedtime.)   gabapentin (NEURONTIN) 300 MG capsule Take 1 capsule (300 mg total) by mouth every evening.   ibuprofen (ADVIL) 800 MG tablet Take 1 tablet (800 mg total) by mouth 3 (three) times daily.   methocarbamol (ROBAXIN) 750 MG tablet Take 1 tablet (750 mg total) by mouth 3 (three) times daily as needed.   methocarbamol (ROBAXIN) 750 MG tablet Take 1 tablet (750 mg total) by mouth 3 (three) times daily as needed. (Patient taking differently: Take 750 mg by mouth at bedtime.)   metoCLOPramide (REGLAN) 10 MG tablet Take 1 tablet (10 mg total) by mouth every 6 (six) hours as needed for nausea or vomiting.   ondansetron (ZOFRAN-ODT) 4 MG disintegrating tablet Take 1 tablet (4 mg total) by mouth every 8 (eight) hours as needed for nausea or vomiting.   PARoxetine (PAXIL) 40 MG tablet Take 1 tablet (40 mg total) by mouth every morning.   acetaminophen (TYLENOL) 500 MG tablet Take 1 tablet (500 mg total) by mouth every 6 (six) hours as needed. (Patient not taking: Reported on 01/22/2023)   dicyclomine (BENTYL) 20 MG tablet Take 1 tablet (20 mg total) by mouth 2 (two) times daily. (Patient not taking: Reported on 01/22/2023)   gabapentin (NEURONTIN) 100 MG capsule Take 1 capsule (100 mg total)  by mouth every morning. (Patient not taking: Reported on 01/22/2023)   naproxen (NAPROSYN) 375 MG tablet Take 1 tablet (375 mg total) by mouth 2 (two) times daily. (Patient not taking: Reported on 01/12/2023)   ondansetron (ZOFRAN) 4 MG tablet Take 1 tablet (4 mg total) by mouth every 6 (six) hours. (Patient not taking: Reported on 01/22/2023)   QUEtiapine (SEROQUEL) 25 MG tablet TAKE 1 TABLET BY MOUTH EVERYDAY AT BEDTIME (Patient not taking: Reported on 12/30/2022)   tiZANidine (ZANAFLEX) 4 MG tablet Take 1 tablet by mouth every 6 - 8 hours as needed **Do not exceed 3 doses in 24 hours (Patient not taking: Reported on 01/22/2023)   No facility-administered encounter medications on file as of 01/22/2023.     REVIEW OF SYSTEMS  : All other systems reviewed and negative except where noted in the History of Present Illness.   PHYSICAL EXAM: BP 120/80   Pulse 75   Ht '6\' 1"'$  (1.854 m)   Wt 206 lb (93.4 kg)  SpO2 96%   BMI 27.18 kg/m  General: Well developed white male in no acute distress Head: Normocephalic and atraumatic Eyes:  Sclerae anicteric, conjunctiva pink. Ears: Normal auditory acuity Lungs: Clear throughout to auscultation; no W/R/R. Heart: Regular rate and rhythm; no M/R/G. Abdomen: Soft, non-distended.  BS present.  Diffuse TTP. Rectal:  Will be done at the time of colonoscopy. Musculoskeletal: Symmetrical with no gross deformities  Skin: No lesions on visible extremities Extremities: No edema  Neurological: Alert oriented x 4, grossly non-focal Psychological:  Alert and cooperative. Normal mood and affect  ASSESSMENT AND PLAN: *Personal history of several adenomatous colon polyps, some very large, seen on colonoscopy in May 2023.  Repeat was recommended in 6 months.  Will schedule Dr. Candis Schatz. *Diarrhea: Reports that he has been having diarrhea for several months since his last colonoscopy.  Consider random biopsies throughout the colon during colonoscopy. *Nausea,  vomiting, diffuse abdominal pain: He describes all of this in addition to the diarrhea.  Reports an 18 pound weight loss in the past couple of months.  Nausea, vomiting, abdominal pain have been more recent over the past 3 to 4 months, diarrhea has been longer.  Recent CT scan abdomen and pelvis with contrast on 2/6 suggested some dilated small bowel loops, but repeat scan 4 days later showed resolution and no acute findings.  Will plan for EGD at the time of his colonoscopy as well.  Says that Zofran is not helping.  Will give some Reglan to use alternating with Zofran.  Prescription sent to pharmacy.  Will check TSH and celiac labs.  Has dicyclomine that he can use as needed for abdominal pain.  **The risks, benefits, and alternatives to EGD and colonoscopy were discussed with the patient and he consents to proceed.   CC:  Libby Maw,*

## 2023-01-22 NOTE — Patient Instructions (Addendum)
Your provider has requested that you go to the basement level for lab work before leaving today. Press "B" on the elevator. The lab is located at the first door on the left as you exit the elevator.   We have sent the following medications to your pharmacy for you to pick up at your convenience: Reglan 10 mg every 6 hours as needed.  You have been scheduled for an endoscopy and colonoscopy. Please follow the written instructions given to you at your visit today. Please pick up your prep supplies at the pharmacy within the next 1-3 days. If you use inhalers (even only as needed), please bring them with you on the day of your procedure.  _______________________________________________________  If your blood pressure at your visit was 140/90 or greater, please contact your primary care physician to follow up on this.  _______________________________________________________  If you are age 92 or older, your body mass index should be between 23-30. Your Body mass index is 27.18 kg/m. If this is out of the aforementioned range listed, please consider follow up with your Primary Care Provider.  If you are age 23 or younger, your body mass index should be between 19-25. Your Body mass index is 27.18 kg/m. If this is out of the aformentioned range listed, please consider follow up with your Primary Care Provider.   ________________________________________________________  The [facility] providers would like to encourage you to use [facility] to communicate with providers for non-urgent requests or questions.  Due to long hold times on the telephone, sending your provider a message by [facility] may be a faster and more efficient way to get a response.  Please allow 48 business hours for a response.  Please remember that this is for non-urgent requests.  _______________________________________________________

## 2023-01-23 LAB — TISSUE TRANSGLUTAMINASE ABS,IGG,IGA
(tTG) Ab, IgA: <1 U/mL
(tTG) Ab, IgG: <1 U/mL

## 2023-01-23 LAB — IGA: Immunoglobulin A: 215 mg/dL (ref 47–310)

## 2023-01-23 NOTE — Progress Notes (Signed)
Agree with the assessment and plan as outlined by Alonza Bogus, PA-C.  Would also suggest RUQUS to exclude stones if endoscopy unrevealing.  Will plan for random colon biopsies at time of repeat colonoscopy.    Eula Mazzola E. Candis Schatz, MD  [facility]

## 2023-01-26 ENCOUNTER — Other Ambulatory Visit (HOSPITAL_COMMUNITY): Payer: Self-pay

## 2023-01-26 ENCOUNTER — Encounter: Payer: Commercial Managed Care - PPO | Admitting: Gastroenterology

## 2023-01-27 ENCOUNTER — Emergency Department (HOSPITAL_COMMUNITY)
Admission: EM | Admit: 2023-01-27 | Discharge: 2023-01-27 | Disposition: A | Discharge status: Home / Self Care | Payer: Commercial Managed Care - PPO | Attending: Emergency Medicine | Admitting: Emergency Medicine

## 2023-01-27 ENCOUNTER — Encounter (HOSPITAL_COMMUNITY): Payer: Self-pay

## 2023-01-27 DIAGNOSIS — T148XXA Other injury of unspecified body region, initial encounter: Secondary | ICD-10-CM

## 2023-01-27 DIAGNOSIS — Z23 Encounter for immunization: Secondary | ICD-10-CM | POA: Diagnosis not present

## 2023-01-27 DIAGNOSIS — S51802A Unspecified open wound of left forearm, initial encounter: Secondary | ICD-10-CM | POA: Insufficient documentation

## 2023-01-27 DIAGNOSIS — S51852A Open bite of left forearm, initial encounter: Secondary | ICD-10-CM | POA: Diagnosis not present

## 2023-01-27 MED ORDER — ACETAMINOPHEN 325 MG PO TABS
650.0000 mg | ORAL_TABLET | Freq: Once | ORAL | Status: AC
  Administered 2023-01-27: 650 mg via ORAL
  Filled 2023-01-27: qty 2

## 2023-01-27 MED ORDER — AMOXICILLIN-POT CLAVULANATE 875-125 MG PO TABS
1.0000 | ORAL_TABLET | Freq: Once | ORAL | Status: AC
  Administered 2023-01-27: 1 via ORAL
  Filled 2023-01-27: qty 1

## 2023-01-27 MED ORDER — TETANUS-DIPHTH-ACELL PERTUSSIS 5-2.5-18.5 LF-MCG/0.5 IM SUSY
0.5000 mL | PREFILLED_SYRINGE | Freq: Once | INTRAMUSCULAR | Status: AC
  Administered 2023-01-27: 0.5 mL via INTRAMUSCULAR
  Filled 2023-01-27: qty 0.5

## 2023-01-27 MED ORDER — AMOXICILLIN-POT CLAVULANATE 875-125 MG PO TABS: 1.0000 | ORAL_TABLET | Freq: Two times a day (BID) | ORAL | 0 refills | Status: DC

## 2023-01-27 MED ORDER — BACITRACIN ZINC 500 UNIT/GM EX OINT
TOPICAL_OINTMENT | Freq: Once | CUTANEOUS | Status: AC
  Administered 2023-01-27: 1 via TOPICAL
  Filled 2023-01-27: qty 0.9

## 2023-01-27 NOTE — Discharge Instructions (Addendum)
Please review the discharge.  Follow-up with occupational health.  Apply antibiotic ointment and keep the area clean
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
## 2023-01-27 NOTE — ED Notes (Signed)
Pt's left wrist was irrigated and cleansed, bacitracin ointment applied. Wrist was wrapped with gauze.
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
## 2023-01-28 ENCOUNTER — Other Ambulatory Visit: Payer: Self-pay

## 2023-01-28 DIAGNOSIS — R112 Nausea with vomiting, unspecified: Secondary | ICD-10-CM

## 2023-01-28 DIAGNOSIS — R1011 Right upper quadrant pain: Secondary | ICD-10-CM

## 2023-01-29 ENCOUNTER — Ambulatory Visit: Payer: Commercial Managed Care - PPO | Admitting: Physician Assistant

## 2023-01-30 ENCOUNTER — Telehealth: Payer: Self-pay | Admitting: Gastroenterology

## 2023-01-30 ENCOUNTER — Telehealth: Payer: Self-pay | Admitting: Family Medicine

## 2023-01-30 ENCOUNTER — Ambulatory Visit (HOSPITAL_COMMUNITY)
Admission: RE | Admit: 2023-01-30 | Discharge: 2023-01-30 | Disposition: A | Discharge status: Home / Self Care | Payer: Commercial Managed Care - PPO | Source: Ambulatory Visit | Attending: Gastroenterology | Admitting: Gastroenterology

## 2023-01-30 DIAGNOSIS — R112 Nausea with vomiting, unspecified: Secondary | ICD-10-CM | POA: Diagnosis not present

## 2023-01-30 DIAGNOSIS — R109 Unspecified abdominal pain: Secondary | ICD-10-CM | POA: Diagnosis not present

## 2023-01-30 DIAGNOSIS — R1011 Right upper quadrant pain: Secondary | ICD-10-CM | POA: Insufficient documentation

## 2023-01-30 DIAGNOSIS — R197 Diarrhea, unspecified: Secondary | ICD-10-CM | POA: Insufficient documentation

## 2023-01-30 NOTE — Telephone Encounter (Signed)
Inbound call from patient, is requesting to speak with a nurse. States he has questions in regards to lab results.

## 2023-01-30 NOTE — Telephone Encounter (Signed)
Pt is wanting a call back concerning his most recent lab results from our office, please advise pt at [phone] [facility]) .

## 2023-01-30 NOTE — Telephone Encounter (Signed)
The pt had questions about labs with his PCP. She was advised to call that office with questions. The pt has been advised of the information and verbalized understanding.

## 2023-02-02 NOTE — Telephone Encounter (Signed)
Called pt. Results given. Pt expresses understanding. No further questions or concerns.

## 2023-02-05 ENCOUNTER — Telehealth: Payer: Self-pay | Admitting: Family Medicine

## 2023-02-05 NOTE — Telephone Encounter (Signed)
Note written patient aware  

## 2023-02-05 NOTE — Telephone Encounter (Signed)
Caller Name: Bow Call back phone #: [phone]  Reason for Call: Pt is requesting back to work note. Stated he cannot afford to come in so can you send it through [facility]. Please call

## 2023-02-05 NOTE — Telephone Encounter (Signed)
Pt called he did not receive his note through mychart. Can you please send again. He is supposed to work Midwife.

## 2023-02-05 NOTE — Telephone Encounter (Signed)
Patient stopped by the office to pick up letter for work.

## 2023-02-05 NOTE — Telephone Encounter (Signed)
Spoke with patient who states that he was out of work last week with stomach issues. Patient has FMLA forms filled out was told that he needed a note saying he was cleared to return from last week.  Okay for note? Please advise

## 2023-02-09 ENCOUNTER — Other Ambulatory Visit: Payer: Self-pay

## 2023-02-11 ENCOUNTER — Inpatient Hospital Stay (HOSPITAL_COMMUNITY)
Admission: EM | Admit: 2023-02-11 | Discharge: 2023-02-14 | DRG: 641 | Disposition: A | Discharge status: Home / Self Care | Payer: Commercial Managed Care - PPO | Attending: Internal Medicine | Admitting: Internal Medicine

## 2023-02-11 ENCOUNTER — Emergency Department (HOSPITAL_COMMUNITY): Payer: Commercial Managed Care - PPO

## 2023-02-11 ENCOUNTER — Encounter (HOSPITAL_COMMUNITY): Payer: Self-pay | Admitting: Family Medicine

## 2023-02-11 DIAGNOSIS — E876 Hypokalemia: Secondary | ICD-10-CM | POA: Diagnosis not present

## 2023-02-11 DIAGNOSIS — R000 Tachycardia, unspecified: Secondary | ICD-10-CM | POA: Diagnosis present

## 2023-02-11 DIAGNOSIS — R110 Nausea: Secondary | ICD-10-CM | POA: Diagnosis not present

## 2023-02-11 DIAGNOSIS — K529 Noninfective gastroenteritis and colitis, unspecified: Secondary | ICD-10-CM

## 2023-02-11 DIAGNOSIS — R9431 Abnormal electrocardiogram [ECG] [EKG]: Secondary | ICD-10-CM | POA: Diagnosis not present

## 2023-02-11 DIAGNOSIS — Z8249 Family history of ischemic heart disease and other diseases of the circulatory system: Secondary | ICD-10-CM

## 2023-02-11 DIAGNOSIS — Z885 Allergy status to narcotic agent status: Secondary | ICD-10-CM

## 2023-02-11 DIAGNOSIS — R1084 Generalized abdominal pain: Secondary | ICD-10-CM | POA: Diagnosis not present

## 2023-02-11 DIAGNOSIS — F341 Dysthymic disorder: Secondary | ICD-10-CM | POA: Diagnosis not present

## 2023-02-11 DIAGNOSIS — R112 Nausea with vomiting, unspecified: Secondary | ICD-10-CM | POA: Diagnosis present

## 2023-02-11 DIAGNOSIS — J4520 Mild intermittent asthma, uncomplicated: Secondary | ICD-10-CM | POA: Diagnosis not present

## 2023-02-11 DIAGNOSIS — M19032 Primary osteoarthritis, left wrist: Secondary | ICD-10-CM | POA: Diagnosis present

## 2023-02-11 DIAGNOSIS — F419 Anxiety disorder, unspecified: Secondary | ICD-10-CM | POA: Diagnosis present

## 2023-02-11 DIAGNOSIS — E861 Hypovolemia: Principal | ICD-10-CM

## 2023-02-11 DIAGNOSIS — F17210 Nicotine dependence, cigarettes, uncomplicated: Secondary | ICD-10-CM | POA: Diagnosis present

## 2023-02-11 DIAGNOSIS — A0811 Acute gastroenteropathy due to Norwalk agent: Secondary | ICD-10-CM | POA: Diagnosis present

## 2023-02-11 DIAGNOSIS — Z79899 Other long term (current) drug therapy: Secondary | ICD-10-CM

## 2023-02-11 DIAGNOSIS — R161 Splenomegaly, not elsewhere classified: Secondary | ICD-10-CM | POA: Diagnosis present

## 2023-02-11 DIAGNOSIS — Z83719 Family history of colon polyps, unspecified: Secondary | ICD-10-CM

## 2023-02-11 DIAGNOSIS — Z981 Arthrodesis status: Secondary | ICD-10-CM

## 2023-02-11 DIAGNOSIS — R197 Diarrhea, unspecified: Secondary | ICD-10-CM | POA: Diagnosis not present

## 2023-02-11 DIAGNOSIS — Z8616 Personal history of COVID-19: Secondary | ICD-10-CM

## 2023-02-11 DIAGNOSIS — R109 Unspecified abdominal pain: Secondary | ICD-10-CM | POA: Diagnosis not present

## 2023-02-11 DIAGNOSIS — Z825 Family history of asthma and other chronic lower respiratory diseases: Secondary | ICD-10-CM

## 2023-02-11 DIAGNOSIS — D696 Thrombocytopenia, unspecified: Secondary | ICD-10-CM | POA: Diagnosis present

## 2023-02-11 DIAGNOSIS — G959 Disease of spinal cord, unspecified: Secondary | ICD-10-CM | POA: Diagnosis not present

## 2023-02-11 DIAGNOSIS — R1111 Vomiting without nausea: Secondary | ICD-10-CM | POA: Diagnosis not present

## 2023-02-11 DIAGNOSIS — Z811 Family history of alcohol abuse and dependence: Secondary | ICD-10-CM

## 2023-02-11 DIAGNOSIS — I959 Hypotension, unspecified: Secondary | ICD-10-CM | POA: Diagnosis present

## 2023-02-11 DIAGNOSIS — Z833 Family history of diabetes mellitus: Secondary | ICD-10-CM

## 2023-02-11 DIAGNOSIS — M47819 Spondylosis without myelopathy or radiculopathy, site unspecified: Secondary | ICD-10-CM | POA: Diagnosis present

## 2023-02-11 DIAGNOSIS — J45909 Unspecified asthma, uncomplicated: Secondary | ICD-10-CM | POA: Diagnosis present

## 2023-02-11 DIAGNOSIS — Z6372 Alcoholism and drug addiction in family: Secondary | ICD-10-CM

## 2023-02-11 DIAGNOSIS — R1032 Left lower quadrant pain: Secondary | ICD-10-CM | POA: Diagnosis not present

## 2023-02-11 LAB — COMPREHENSIVE METABOLIC PANEL
ALT: 44 U/L (ref 0–44)
AST: 41 U/L (ref 15–41)
Albumin: 4.5 g/dL (ref 3.5–5.0)
Alkaline Phosphatase: 79 U/L (ref 38–126)
Anion gap: 18 — ABNORMAL HIGH (ref 5–15)
BUN: 9 mg/dL (ref 6–20)
CO2: 19 mmol/L — ABNORMAL LOW (ref 22–32)
Calcium: 9.2 mg/dL (ref 8.9–10.3)
Chloride: 99 mmol/L (ref 98–111)
Creatinine, Ser: 0.95 mg/dL (ref 0.61–1.24)
GFR, Estimated: >60 mL/min (ref >60)
Glucose, Bld: 140 mg/dL — ABNORMAL HIGH (ref 70–99)
Potassium: 3.5 mmol/L (ref 3.5–5.1)
Sodium: 136 mmol/L (ref 135–145)
Total Bilirubin: 1.2 mg/dL (ref 0.3–1.2)
Total Protein: 7.5 g/dL (ref 6.5–8.1)

## 2023-02-11 LAB — CBC WITH DIFFERENTIAL/PLATELET
Abs Immature Granulocytes: 0.05 10*3/uL (ref 0.00–0.07)
Basophils Absolute: 0 10*3/uL (ref 0.0–0.1)
Basophils Relative: 0 %
Eosinophils Absolute: 0.1 10*3/uL (ref 0.0–0.5)
Eosinophils Relative: 1 %
HCT: 49.2 % (ref 39.0–52.0)
Hemoglobin: 16.4 g/dL (ref 13.0–17.0)
Immature Granulocytes: 0 %
Lymphocytes Relative: 4 %
Lymphs Abs: 0.4 10*3/uL — ABNORMAL LOW (ref 0.7–4.0)
MCH: 27.8 pg (ref 26.0–34.0)
MCHC: 33.3 g/dL (ref 30.0–36.0)
MCV: 83.5 fL (ref 80.0–100.0)
Monocytes Absolute: 0.5 10*3/uL (ref 0.1–1.0)
Monocytes Relative: 5 %
Neutro Abs: 10.4 10*3/uL — ABNORMAL HIGH (ref 1.7–7.7)
Neutrophils Relative %: 90 %
Platelets: 123 10*3/uL — ABNORMAL LOW (ref 150–400)
RBC: 5.89 MIL/uL — ABNORMAL HIGH (ref 4.22–5.81)
RDW: 14.1 % (ref 11.5–15.5)
WBC: 11.5 10*3/uL — ABNORMAL HIGH (ref 4.0–10.5)
nRBC: 0 % (ref 0.0–0.2)

## 2023-02-11 LAB — TROPONIN I (HIGH SENSITIVITY)
Troponin I (High Sensitivity): <2 ng/L (ref <18)
Troponin I (High Sensitivity): 4 ng/L (ref <18)

## 2023-02-11 LAB — LIPASE, BLOOD: Lipase: 28 U/L (ref 11–51)

## 2023-02-11 MED ORDER — SODIUM CHLORIDE 0.9 % IV SOLN
12.5000 mg | Freq: Four times a day (QID) | INTRAVENOUS | Status: DC | PRN
  Administered 2023-02-12 – 2023-02-13 (×2): 12.5 mg via INTRAVENOUS
  Filled 2023-02-11 (×2): qty 12.5

## 2023-02-11 MED ORDER — SODIUM CHLORIDE 0.9% FLUSH
3.0000 mL | Freq: Two times a day (BID) | INTRAVENOUS | Status: DC
  Administered 2023-02-11 – 2023-02-14 (×2): 3 mL via INTRAVENOUS

## 2023-02-11 MED ORDER — ENOXAPARIN SODIUM 40 MG/0.4ML IJ SOSY
40.0000 mg | PREFILLED_SYRINGE | INTRAMUSCULAR | Status: DC
  Administered 2023-02-12 – 2023-02-14 (×3): 40 mg via SUBCUTANEOUS
  Filled 2023-02-11 (×3): qty 0.4

## 2023-02-11 MED ORDER — METHOCARBAMOL 500 MG PO TABS
750.0000 mg | ORAL_TABLET | Freq: Three times a day (TID) | ORAL | Status: DC | PRN
  Administered 2023-02-12: 750 mg via ORAL
  Filled 2023-02-11 (×2): qty 2

## 2023-02-11 MED ORDER — ONDANSETRON HCL 4 MG/2ML IJ SOLN
4.0000 mg | Freq: Once | INTRAMUSCULAR | Status: AC
  Administered 2023-02-11: 4 mg via INTRAVENOUS
  Filled 2023-02-11: qty 2

## 2023-02-11 MED ORDER — IOHEXOL 350 MG/ML SOLN
75.0000 mL | Freq: Once | INTRAVENOUS | Status: AC | PRN
  Administered 2023-02-11: 75 mL via INTRAVENOUS

## 2023-02-11 MED ORDER — ACETAMINOPHEN 325 MG PO TABS
650.0000 mg | ORAL_TABLET | Freq: Four times a day (QID) | ORAL | Status: DC | PRN
  Administered 2023-02-12 – 2023-02-13 (×4): 650 mg via ORAL
  Filled 2023-02-11 (×4): qty 2

## 2023-02-11 MED ORDER — LACTATED RINGERS IV SOLN: INTRAVENOUS | Status: DC

## 2023-02-11 MED ORDER — ONDANSETRON HCL 4 MG/2ML IJ SOLN
4.0000 mg | Freq: Four times a day (QID) | INTRAMUSCULAR | Status: DC | PRN
  Administered 2023-02-12 – 2023-02-13 (×2): 4 mg via INTRAVENOUS
  Filled 2023-02-11 (×2): qty 2

## 2023-02-11 MED ORDER — ACETAMINOPHEN 650 MG RE SUPP: 650.0000 mg | Freq: Four times a day (QID) | RECTAL | Status: DC | PRN

## 2023-02-11 MED ORDER — ALBUTEROL SULFATE (2.5 MG/3ML) 0.083% IN NEBU: 2.5000 mg | INHALATION_SOLUTION | Freq: Four times a day (QID) | RESPIRATORY_TRACT | Status: DC | PRN

## 2023-02-11 MED ORDER — MORPHINE SULFATE (PF) 2 MG/ML IV SOLN
2.0000 mg | INTRAVENOUS | Status: DC | PRN
  Administered 2023-02-12 – 2023-02-14 (×12): 2 mg via INTRAVENOUS
  Filled 2023-02-11 (×12): qty 1

## 2023-02-11 MED ORDER — SODIUM CHLORIDE 0.9 % IV BOLUS
1000.0000 mL | Freq: Once | INTRAVENOUS | Status: AC
  Administered 2023-02-11: 1000 mL via INTRAVENOUS

## 2023-02-11 MED ORDER — DROPERIDOL 2.5 MG/ML IJ SOLN
2.5000 mg | Freq: Once | INTRAMUSCULAR | Status: AC
  Administered 2023-02-11: 2.5 mg via INTRAVENOUS
  Filled 2023-02-11: qty 2

## 2023-02-11 MED ORDER — OXYCODONE HCL 5 MG PO TABS
5.0000 mg | ORAL_TABLET | ORAL | Status: DC | PRN
  Administered 2023-02-12 – 2023-02-13 (×5): 5 mg via ORAL
  Filled 2023-02-11 (×6): qty 1

## 2023-02-11 MED ORDER — MORPHINE SULFATE (PF) 4 MG/ML IV SOLN
4.0000 mg | Freq: Once | INTRAVENOUS | Status: AC
  Administered 2023-02-11: 4 mg via INTRAVENOUS
  Filled 2023-02-11: qty 1

## 2023-02-11 MED ORDER — PAROXETINE HCL 20 MG PO TABS
40.0000 mg | ORAL_TABLET | Freq: Every day | ORAL | Status: DC
  Administered 2023-02-12 – 2023-02-14 (×3): 40 mg via ORAL
  Filled 2023-02-11 (×3): qty 2

## 2023-02-11 MED ORDER — GABAPENTIN 300 MG PO CAPS
300.0000 mg | ORAL_CAPSULE | Freq: Every day | ORAL | Status: DC
  Administered 2023-02-12 – 2023-02-13 (×3): 300 mg via ORAL
  Filled 2023-02-11 (×3): qty 1

## 2023-02-11 MED ORDER — LACTATED RINGERS IV BOLUS
1000.0000 mL | Freq: Once | INTRAVENOUS | Status: AC
  Administered 2023-02-11: 1000 mL via INTRAVENOUS

## 2023-02-11 MED ORDER — ONDANSETRON HCL 4 MG PO TABS: 4.0000 mg | ORAL_TABLET | Freq: Four times a day (QID) | ORAL | Status: DC | PRN

## 2023-02-11 NOTE — ED Notes (Signed)
Pt given something to drink and crackers for PO challange

## 2023-02-11 NOTE — ED Provider Notes (Signed)
[facility] Provider Note   CSN: [id number] Arrival date & time: 02/11/23  1309     History  No chief complaint on file.   Alan Sanders is a 52 y.o. male with past medical history significant for tobacco abuse, GERD, seizures presents to the ED complaining of nausea, vomiting, and diarrhea with inability to hold his stool.  Patient has been having abdominal pain with similar symptoms for the past few months, but it is much worse today.  Patient is being followed by gastroenterology and was set to be scoped on Monday due to these symptoms and associated weight loss.  He reports that he ate mac & cheese last night and his symptoms began soon after.  He states that he is having pain on the left side of his abdomen that is worse with palpation.  He has some central back pain and chest pain.  Denies fever, chills, abdominal distention, dizziness, lightheadedness, syncope, weakness, urinary symptoms.         Home Medications Prior to Admission medications   Medication Sig Start Date End Date Taking? Authorizing Provider  acetaminophen (TYLENOL) 500 MG tablet Take 1 tablet (500 mg total) by mouth every 6 (six) hours as needed. Patient not taking: Reported on 01/22/2023 10/25/22   Redwine, Madison A, PA-C  albuterol (VENTOLIN HFA) 108 (90 Base) MCG/ACT inhaler Inhale 1-2 puffs into the lungs every 6 (six) hours as needed for wheezing or shortness of breath. 07/18/22   Haydee Salter, MD  amoxicillin-clavulanate (AUGMENTIN) 875-125 MG tablet Take 1 tablet by mouth every 12 (twelve) hours. 01/27/23   Dorie Rank, MD  dicyclomine (BENTYL) 20 MG tablet Take 1 tablet (20 mg total) by mouth 2 (two) times daily. Patient not taking: Reported on 01/22/2023 01/04/23   Dorie Rank, MD  gabapentin (NEURONTIN) 100 MG capsule Take 1 capsule (100 mg total) by mouth every morning. Patient not taking: Reported on 01/22/2023 10/06/22   Dawley, Troy C, DO  gabapentin  (NEURONTIN) 300 MG capsule Take 1 capsule (300 mg total) by mouth 3 (three) times daily. Patient taking differently: Take 300 mg by mouth at bedtime. 08/26/22   Libby Maw, MD  gabapentin (NEURONTIN) 300 MG capsule Take 1 capsule (300 mg total) by mouth every evening. 10/06/22   Dawley, Troy C, DO  ibuprofen (ADVIL) 800 MG tablet Take 1 tablet (800 mg total) by mouth 3 (three) times daily. 10/25/22   Redwine, Madison A, PA-C  methocarbamol (ROBAXIN) 750 MG tablet Take 1 tablet (750 mg total) by mouth 3 (three) times daily as needed. 08/26/22     methocarbamol (ROBAXIN) 750 MG tablet Take 1 tablet (750 mg total) by mouth 3 (three) times daily as needed. Patient taking differently: Take 750 mg by mouth at bedtime. 10/06/22     metoCLOPramide (REGLAN) 10 MG tablet Take 1 tablet (10 mg total) by mouth every 6 (six) hours as needed for nausea or vomiting. 01/22/23   Zehr, Laban Emperor, PA-C  naproxen (NAPROSYN) 375 MG tablet Take 1 tablet (375 mg total) by mouth 2 (two) times daily. Patient not taking: Reported on 01/12/2023 01/04/23   Dorie Rank, MD  ondansetron (ZOFRAN) 4 MG tablet Take 1 tablet (4 mg total) by mouth every 6 (six) hours. Patient not taking: Reported on 01/22/2023 10/25/22   Redwine, Madison A, PA-C  ondansetron (ZOFRAN-ODT) 4 MG disintegrating tablet Take 1 tablet (4 mg total) by mouth every 8 (eight) hours as needed for nausea  or vomiting. 12/30/22   Horton, Mayer Masker, MD  PARoxetine (PAXIL) 40 MG tablet Take 1 tablet (40 mg total) by mouth every morning. 01/12/23   Mliss Sax, MD  QUEtiapine (SEROQUEL) 25 MG tablet TAKE 1 TABLET BY MOUTH EVERYDAY AT BEDTIME Patient not taking: Reported on 12/30/2022 03/21/22   Mliss Sax, MD  tiZANidine (ZANAFLEX) 4 MG tablet Take 1 tablet by mouth every 6 - 8 hours as needed **Do not exceed 3 doses in 24 hours Patient not taking: Reported on 01/22/2023 05/22/22         Allergies    Hydromorphone    Review of Systems   Review  of Systems  Constitutional:  Negative for chills and fever.  Gastrointestinal:  Positive for abdominal pain, diarrhea, nausea and vomiting. Negative for abdominal distention.  Genitourinary:  Negative for dysuria and hematuria.  Neurological:  Negative for dizziness, syncope, weakness and light-headedness.    Physical Exam Updated Vital Signs BP 122/60   Pulse (!) 104   Temp 97.6 F (36.4 C) (Oral)   Resp 19   SpO2 99%  Physical Exam Vitals and nursing note reviewed.  Constitutional:      General: He is not in acute distress.    Appearance: He is ill-appearing.  HENT:     Mouth/Throat:     Mouth: Mucous membranes are moist.     Pharynx: Oropharynx is clear.  Cardiovascular:     Rate and Rhythm: Regular rhythm. Tachycardia present.     Pulses: Normal pulses.     Heart sounds: Normal heart sounds.  Pulmonary:     Effort: Pulmonary effort is normal. No respiratory distress.     Breath sounds: Normal breath sounds and air entry.  Abdominal:     General: Abdomen is flat. Bowel sounds are normal. There is no distension.     Palpations: Abdomen is soft.     Tenderness: There is abdominal tenderness in the left upper quadrant and left lower quadrant. There is no right CVA tenderness, left CVA tenderness, guarding or rebound.  Skin:    General: Skin is warm and dry.     Capillary Refill: Capillary refill takes less than 2 seconds.  Neurological:     Mental Status: He is alert. Mental status is at baseline.  Psychiatric:        Mood and Affect: Mood normal.        Behavior: Behavior normal.     ED Results / Procedures / Treatments   Labs (all labs ordered are listed, but only abnormal results are displayed) Labs Reviewed  COMPREHENSIVE METABOLIC PANEL - Abnormal; Notable for the following components:      Result Value   CO2 19 (*)    Glucose, Bld 140 (*)    Anion gap 18 (*)    All other components within normal limits  CBC WITH DIFFERENTIAL/PLATELET - Abnormal; Notable  for the following components:   WBC 11.5 (*)    RBC 5.89 (*)    Platelets 123 (*)    Neutro Abs 10.4 (*)    Lymphs Abs 0.4 (*)    All other components within normal limits  LIPASE, BLOOD  URINALYSIS, ROUTINE W REFLEX MICROSCOPIC  TROPONIN I (HIGH SENSITIVITY)  TROPONIN I (HIGH SENSITIVITY)    EKG EKG Interpretation  Date/Time:  Wednesday February 11 2023 13:34:29 EDT Ventricular Rate:  128 PR Interval:  146 QRS Duration: 100 QT Interval:  285 QTC Calculation: 416 R Axis:   123 Text Interpretation:  Sinus tachycardia Right axis deviation Minimal ST elevation, inferior leads Confirmed by Tretha Sciara [phone]) on 02/11/2023 2:46:26 PM  Radiology CT ABDOMEN PELVIS W CONTRAST  Result Date: 02/11/2023 CLINICAL DATA:  Acute left-sided abdominal pain. EXAM: CT ABDOMEN AND PELVIS WITH CONTRAST TECHNIQUE: Multidetector CT imaging of the abdomen and pelvis was performed using the standard protocol following bolus administration of intravenous contrast. RADIATION DOSE REDUCTION: This exam was performed according to the departmental dose-optimization program which includes automated exposure control, adjustment of the mA and/or kV according to patient size and/or use of iterative reconstruction technique. CONTRAST:  75mL OMNIPAQUE IOHEXOL 350 MG/ML SOLN COMPARISON:  January 03, 2023. FINDINGS: Lower chest: No acute abnormality. Hepatobiliary: No focal liver abnormality is seen. No gallstones, gallbladder wall thickening, or biliary dilatation. Pancreas: Unremarkable. No pancreatic ductal dilatation or surrounding inflammatory changes. Spleen: Mild splenomegaly. Adrenals/Urinary Tract: Adrenal glands are unremarkable. Kidneys are normal, without renal calculi, focal lesion, or hydronephrosis. Bladder is unremarkable. Stomach/Bowel: Stomach is within normal limits. Appendix appears normal. No evidence of bowel wall thickening, distention, or inflammatory changes. Vascular/Lymphatic: No significant  vascular findings are present. No enlarged abdominal or pelvic lymph nodes. Reproductive: Prostate is unremarkable. Other: No abdominal wall hernia or abnormality. No abdominopelvic ascites. Musculoskeletal: No acute or significant osseous findings. IMPRESSION: Mild splenomegaly. No definite acute abnormality seen in the abdomen or pelvis. Electronically Signed   By: Marijo Conception M.D.   On: 02/11/2023 16:11    Procedures Procedures    Medications Ordered in ED Medications  lactated ringers infusion ( Intravenous New Bag/Given 02/11/23 2225)  sodium chloride 0.9 % bolus 1,000 mL (0 mLs Intravenous Stopped 02/11/23 1521)  droperidol (INAPSINE) 2.5 MG/ML injection 2.5 mg (2.5 mg Intravenous Given 02/11/23 1400)  sodium chloride 0.9 % bolus 1,000 mL (0 mLs Intravenous Stopped 02/11/23 1844)  iohexol (OMNIPAQUE) 350 MG/ML injection 75 mL (75 mLs Intravenous Contrast Given 02/11/23 1559)  lactated ringers bolus 1,000 mL (0 mLs Intravenous Stopped 02/11/23 2011)    ED Course/ Medical Decision Making/ A&P Clinical Course as of 02/11/23 2227  Wed Feb 11, 2023  1350 Evaluated personally at bedside.  Nursing worsening severe left upper quadrant pain severe nausea vomiting today it is radiating to his left upper back.  Started after breakfast this morning after his overnight shift. [CC]  1527 Reassessed at bedside.  Symptoms are grossly resolved.  He feels that his pain is improved.  He is GCS 15 though he is a little groggy.  IV fluids are not running and is IV right now, he is pending CT scan.  Likely stable for outpatient care and management if reassuring CT and symptoms remained improved.  May need to be reassessed if concerning findings are identified. [CC]    Clinical Course User Index [CC] Tretha Sciara, MD                             Medical Decision Making Amount and/or Complexity of Data Reviewed Labs: ordered. Radiology: ordered.  Risk Prescription drug management.   This patient  presents to the ED with chief complaint(s) of nausea, vomiting, diarrhea with abdominal pain with pertinent past medical history of tobacco abuse, GERD, seizures.  The complaint involves an extensive differential diagnosis and also carries with it a high risk of complications and morbidity.    The differential diagnosis includes gastroenteritis, pancreatitis, cholecystitis, hypovolemia, dehydration, diverticulitis, norovirus   The initial plan is to obtain abdominal pain workup  and give IV fluids and antiemetic  Additional history obtained: Records reviewed  patient has been evaluated by GI and has endoscopy/colonoscopy set for after Easter. He has nausea, vomiting, diarrhea episodes every couple of weeks.  Initial Assessment:   Exam significant for a patient who is very uncomfortable and actively vomiting.  He is rocking back in forth in the bed.  He is not in acute respiratory distress.  Skin is warm and dry with normal color.  Abdomen is soft with tenderness to the LUQ and LLQ.  No appreciable hernias, distension, or rashes.    Independent ECG/labs interpretation:  The following labs were independently interpreted:  CBC with leukocytosis and thrombocytopenia.  Patient has known history of thrombocytopenia.  Metabolic panel without electrolyte derangement, he does have elevated anion gap, likely related to vomiting and diarrhea.  Lipase normal.  Troponin negative.   Independent visualization and interpretation of imaging: I independently visualized the following imaging with scope of interpretation limited to determining acute life threatening conditions related to emergency care: CT abdomen/pelvis, which revealed mild splenomegaly but no acute intraabdominal findings to explain patient's symptoms.   Treatment and Reassessment: Patient was given total of 3L IV fluids due to having continued hypotension and tachycardia.  He did have some improvement in BP, but it is up and down.  Patient's HR  jumps from 102 to 116bpm when he goes from lying to sitting.  I feel that patient would benefit from admission to hospital with fluids and observation.  Disposition:   Patient care was handed off to [facility], PA-C at end of shift.  Plan is to admit patient to hospital for continued fluid maintenance and observation as he is still tachycardic and hypotensive at times.            Final Clinical Impression(s) / ED Diagnoses Final diagnoses:  Hypovolemia  Nausea vomiting and diarrhea    Rx / DC Orders ED Discharge Orders     None         Pat Kocher, [place] 02/11/23 2227    Tretha Sciara, MD 02/13/23 1456

## 2023-02-11 NOTE — H&P (Signed)
History and Physical    Alan Sanders [id number] DOB: 1971-08-09 DOA: 02/11/2023  PCP: Libby Maw, MD   Patient coming from: Home   Chief Complaint: Abdominal pain, N/V/D   HPI: Alan Sanders is a pleasant 52 y.o. male with medical history significant for asthma, anxiety, and cervical myelopathy status post ACDF who presents to the emergency department with abdominal pain, nausea, vomiting, and diarrhea.  Patient reports that he has been experiencing frequent loose stools, nausea, vomiting, and abdominal pain for at least 3 months now.  Pain is typically in the lower abdomen.  He has been following with [facility] for this and states that he is scheduled for upper and lower endoscopy on 02/23/2023.   Patient works night shift and had an uneventful night last night, but then woke shortly before noon today with severe left lower quadrant abdominal pain, nausea, nonbloody vomiting, and diarrhea.  He reports having roughly a dozen episodes of diarrhea today and vomited approximately 10 times.  He denies fever or chills.  Denies bleeding.  ED Course: Upon arrival to the ED, patient is found to be afebrile and saturating adequately on room air with elevated heart rate and systolic blood pressure in the 90s.  EKG demonstrates sinus tachycardia 328.  Labs notable for serum bicarbonate 19, anion gap 18, WBC 11,500, and normal troponin.  CT of the abdomen pelvis notable for mild splenomegaly but no acute findings.  Patient was given 3 L of IV fluids, morphine, Zofran, and droperidol in the ED.  He was unable to tolerate any oral intake.  Review of Systems:  All other systems reviewed and apart from HPI, are negative.  Past Medical History:  Diagnosis Date   Anxiety    situational anxiety-on meds   Arthritis    neck/back/LEFT wrist   Asthma    PRN inhaler   Balance problem    Depression    on meds   Family history of colonic polyps 05/08/2022   Gait difficulty     GERD (gastroesophageal reflux disease)    OTC PRN meds   Headache    migraines   History of colonic polyps 05/08/2022   Inguinal hernia of left side without obstruction or gangrene    Pneumonia    Seasonal allergies    Seizures ([place])    " its been along time ,since my last seizure "   Spleen enlarged    Thrombocytopenia ([place])    Tobacco abuse     Past Surgical History:  Procedure Laterality Date   ANTERIOR CERVICAL DECOMP/DISCECTOMY FUSION N/A 04/16/2021   Procedure: Cervical three-four  Anterior cervical decompression/discectomy/fusion;  Surgeon: Karsten Ro, DO;  Location: [place];  Service: Neurosurgery;  Laterality: N/A;   COLONOSCOPY  03/2022   -MAC-suprep(adeq)-many TA's-1 piecemeal   INGUINAL HERNIA REPAIR Right 2006   POLYPECTOMY  03/2022   many TA's-1 piecemeal   SURAL NERVE BX Right 12/11/2020   Procedure: SURAL NERVE BIOPSY;  Surgeon: Karsten Ro, DO;  Location: [place];  Service: Neurosurgery;  Laterality: Right;    Social History:   reports that he has been smoking cigarettes. He has a 12.00 pack-year smoking history. His smokeless tobacco use includes snuff. He reports that he does not drink alcohol and does not use drugs.  Allergies  Allergen Reactions   Hydromorphone Itching and Rash    Family History  Problem Relation Age of Onset   Diabetes Mother    Hypertension Mother    COPD Mother  Colon polyps Father        unknown number   Alcoholism Brother    Colon cancer Neg Hx    Esophageal cancer Neg Hx    Stomach cancer Neg Hx    Rectal cancer Neg Hx      Prior to Admission medications   Medication Sig Start Date End Date Taking? Authorizing Provider  acetaminophen (TYLENOL) 500 MG tablet Take 1 tablet (500 mg total) by mouth every 6 (six) hours as needed. Patient not taking: Reported on 01/22/2023 10/25/22   Redwine, Madison A, PA-C  albuterol (VENTOLIN HFA) 108 (90 Base) MCG/ACT inhaler Inhale 1-2 puffs into the lungs every 6 (six) hours as  needed for wheezing or shortness of breath. 07/18/22   Haydee Salter, MD  amoxicillin-clavulanate (AUGMENTIN) 875-125 MG tablet Take 1 tablet by mouth every 12 (twelve) hours. 01/27/23   Dorie Rank, MD  dicyclomine (BENTYL) 20 MG tablet Take 1 tablet (20 mg total) by mouth 2 (two) times daily. Patient not taking: Reported on 01/22/2023 01/04/23   Dorie Rank, MD  gabapentin (NEURONTIN) 100 MG capsule Take 1 capsule (100 mg total) by mouth every morning. Patient not taking: Reported on 01/22/2023 10/06/22   Dawley, Troy C, DO  gabapentin (NEURONTIN) 300 MG capsule Take 1 capsule (300 mg total) by mouth 3 (three) times daily. Patient taking differently: Take 300 mg by mouth at bedtime. 08/26/22   Libby Maw, MD  gabapentin (NEURONTIN) 300 MG capsule Take 1 capsule (300 mg total) by mouth every evening. 10/06/22   Dawley, Troy C, DO  ibuprofen (ADVIL) 800 MG tablet Take 1 tablet (800 mg total) by mouth 3 (three) times daily. 10/25/22   Redwine, Madison A, PA-C  methocarbamol (ROBAXIN) 750 MG tablet Take 1 tablet (750 mg total) by mouth 3 (three) times daily as needed. 08/26/22     methocarbamol (ROBAXIN) 750 MG tablet Take 1 tablet (750 mg total) by mouth 3 (three) times daily as needed. Patient taking differently: Take 750 mg by mouth at bedtime. 10/06/22     metoCLOPramide (REGLAN) 10 MG tablet Take 1 tablet (10 mg total) by mouth every 6 (six) hours as needed for nausea or vomiting. 01/22/23   Zehr, Laban Emperor, PA-C  naproxen (NAPROSYN) 375 MG tablet Take 1 tablet (375 mg total) by mouth 2 (two) times daily. Patient not taking: Reported on 01/12/2023 01/04/23   Dorie Rank, MD  ondansetron (ZOFRAN) 4 MG tablet Take 1 tablet (4 mg total) by mouth every 6 (six) hours. Patient not taking: Reported on 01/22/2023 10/25/22   Redwine, Madison A, PA-C  ondansetron (ZOFRAN-ODT) 4 MG disintegrating tablet Take 1 tablet (4 mg total) by mouth every 8 (eight) hours as needed for nausea or vomiting. 12/30/22    Horton, Barbette Hair, MD  PARoxetine (PAXIL) 40 MG tablet Take 1 tablet (40 mg total) by mouth every morning. 01/12/23   Libby Maw, MD  QUEtiapine (SEROQUEL) 25 MG tablet TAKE 1 TABLET BY MOUTH EVERYDAY AT BEDTIME Patient not taking: Reported on 12/30/2022 03/21/22   Libby Maw, MD  tiZANidine (ZANAFLEX) 4 MG tablet Take 1 tablet by mouth every 6 - 8 hours as needed **Do not exceed 3 doses in 24 hours Patient not taking: Reported on 01/22/2023 05/22/22       Physical Exam: Vitals:   02/11/23 1945 02/11/23 2026 02/11/23 2230 02/11/23 2324  BP: 91/67 122/60 127/75 110/67  Pulse: (!) 102 (!) 104 (!) 106 (!) 101  Resp: 20 19  18  Temp:    (!) 97.3 F (36.3 C)  TempSrc:    Oral  SpO2: 96% 99% 99% 97%     Constitutional: NAD, calm  Eyes: PERTLA, lids and conjunctivae normal ENMT: Mucous membranes are moist. Posterior pharynx clear of any exudate or lesions.   Neck: supple, no masses  Respiratory: no wheezing, no crackles. No accessory muscle use.  Cardiovascular: S1 & S2 heard, regular rate and rhythm. No extremity edema.  Abdomen: Soft no distension, tender in lower abdomen without rebound pain or guarding. Bowel sounds active.  Musculoskeletal: no clubbing / cyanosis. No joint deformity upper and lower extremities.   Skin: no significant rashes, lesions, ulcers. Warm, dry, well-perfused. Neurologic: CN 2-12 grossly intact. Moving all extremities. Alert and oriented.  Psychiatric: Pleasant. Cooperative.    Labs and Imaging on Admission: I have personally reviewed following labs and imaging studies  CBC: Recent Labs  Lab 02/11/23 1323  WBC 11.5*  NEUTROABS 10.4*  HGB 16.4  HCT 49.2  MCV 83.5  PLT [id number]*   Basic Metabolic Panel: Recent Labs  Lab 02/11/23 1323  NA 136  K 3.5  CL 99  CO2 19*  GLUCOSE 140*  BUN 9  CREATININE 0.95  CALCIUM 9.2   GFR: CrCl cannot be calculated (Unknown ideal weight.). Liver Function Tests: Recent Labs  Lab  02/11/23 1323  AST 41  ALT 44  ALKPHOS 79  BILITOT 1.2  PROT 7.5  ALBUMIN 4.5   Recent Labs  Lab 02/11/23 1323  LIPASE 28   No results for input(s): "AMMONIA" in the last 168 hours. Coagulation Profile: No results for input(s): "INR", "PROTIME" in the last 168 hours. Cardiac Enzymes: No results for input(s): "CKTOTAL", "CKMB", "CKMBINDEX", "TROPONINI" in the last 168 hours. BNP (last 3 results) No results for input(s): "PROBNP" in the last 8760 hours. HbA1C: No results for input(s): "HGBA1C" in the last 72 hours. CBG: No results for input(s): "GLUCAP" in the last 168 hours. Lipid Profile: No results for input(s): "CHOL", "HDL", "LDLCALC", "TRIG", "CHOLHDL", "LDLDIRECT" in the last 72 hours. Thyroid Function Tests: No results for input(s): "TSH", "T4TOTAL", "FREET4", "T3FREE", "THYROIDAB" in the last 72 hours. Anemia Panel: No results for input(s): "VITAMINB12", "FOLATE", "FERRITIN", "TIBC", "IRON", "RETICCTPCT" in the last 72 hours. Urine analysis:    Component Value Date/Time   COLORURINE YELLOW 01/12/2023 1414   APPEARANCEUR CLEAR 01/12/2023 1414   LABSPEC >=1.030 (A) 01/12/2023 1414   PHURINE 5.5 01/12/2023 1414   GLUCOSEU NEGATIVE 01/12/2023 1414   HGBUR NEGATIVE 01/12/2023 1414   BILIRUBINUR SMALL (A) 01/12/2023 1414   KETONESUR NEGATIVE 01/12/2023 1414   PROTEINUR NEGATIVE 01/03/2023 2002   UROBILINOGEN 0.2 01/12/2023 1414   NITRITE NEGATIVE 01/12/2023 1414   LEUKOCYTESUR NEGATIVE 01/12/2023 1414   Sepsis Labs: @LABRCNTIP (procalcitonin:4,lacticidven:4) )No results found for this or any previous visit (from the past 240 hour(s)).   Radiological Exams on Admission: CT ABDOMEN PELVIS W CONTRAST  Result Date: 02/11/2023 CLINICAL DATA:  Acute left-sided abdominal pain. EXAM: CT ABDOMEN AND PELVIS WITH CONTRAST TECHNIQUE: Multidetector CT imaging of the abdomen and pelvis was performed using the standard protocol following bolus administration of intravenous  contrast. RADIATION DOSE REDUCTION: This exam was performed according to the departmental dose-optimization program which includes automated exposure control, adjustment of the mA and/or kV according to patient size and/or use of iterative reconstruction technique. CONTRAST:  19mL OMNIPAQUE IOHEXOL 350 MG/ML SOLN COMPARISON:  January 03, 2023. FINDINGS: Lower chest: No acute abnormality. Hepatobiliary: No focal liver abnormality is  seen. No gallstones, gallbladder wall thickening, or biliary dilatation. Pancreas: Unremarkable. No pancreatic ductal dilatation or surrounding inflammatory changes. Spleen: Mild splenomegaly. Adrenals/Urinary Tract: Adrenal glands are unremarkable. Kidneys are normal, without renal calculi, focal lesion, or hydronephrosis. Bladder is unremarkable. Stomach/Bowel: Stomach is within normal limits. Appendix appears normal. No evidence of bowel wall thickening, distention, or inflammatory changes. Vascular/Lymphatic: No significant vascular findings are present. No enlarged abdominal or pelvic lymph nodes. Reproductive: Prostate is unremarkable. Other: No abdominal wall hernia or abnormality. No abdominopelvic ascites. Musculoskeletal: No acute or significant osseous findings. IMPRESSION: Mild splenomegaly. No definite acute abnormality seen in the abdomen or pelvis. Electronically Signed   By: Marijo Conception M.D.   On: 02/11/2023 16:11    EKG: Independently reviewed. Sinus tachycardia, rate 128.   Assessment/Plan   1. Abdominal pain with N/V/D  - Sxs present intermittently for months and followed by [facility], much worse today  - No acute findings on CT in ED  - Plan for bowel rest, IVF hydration, pain-control, antiemetics, and electrolyte monitoring   2. Asthma  - Stable   - Continue as-needed albuterol   3. Anxiety  - Continue Paxil     4. Cervical myelopathy  - S/p ACDF in May 2022 - Continue gabapentin and as-needed Robaxin    DVT prophylaxis: Lovenox  Code  Status: Full  Level of Care: Level of care: Telemetry Medical Family Communication: None present   Disposition Plan:  Patient is from: home  Anticipated d/c is to: home  Anticipated d/c date is: Possibly as early as 02/12/23  Patient currently: pending tolerance of adequate oral intake  Consults called: None  Admission status: Observation     Vianne Bulls, MD [facility]  02/11/2023, 11:43 PM

## 2023-02-11 NOTE — ED Provider Notes (Signed)
  Physical Exam  BP 110/67 (BP Location: Right Arm)   Pulse (!) 101   Temp (!) 97.3 F (36.3 C) (Oral)   Resp 18   SpO2 97%   Physical Exam  Procedures  Procedures  ED Course / MDM   Clinical Course as of 02/11/23 2342  Wed Feb 11, 2023  1350 Evaluated personally at bedside.  Nursing worsening severe left upper quadrant pain severe nausea vomiting today it is radiating to his left upper back.  Started after breakfast this morning after his overnight shift. [CC]  1527 Reassessed at bedside.  Symptoms are grossly resolved.  He feels that his pain is improved.  He is GCS 15 though he is a little groggy.  IV fluids are not running and is IV right now, he is pending CT scan.  Likely stable for outpatient care and management if reassuring CT and symptoms remained improved.  May need to be reassessed if concerning findings are identified. [CC]  2247 Scheduled for April 1st colonoscopy.  NVD for 3 months. Abd pain today.  Diarrhea 10+ times per day Intermittent vomititng but constant today.  Securit here Dustin Flock  [WF]    Clinical Course User Index [CC] Tretha Sciara, MD [WF] Tedd Sias, [place]   Medical Decision Making Amount and/or Complexity of Data Reviewed Labs: ordered. Radiology: ordered.  Risk Prescription drug management. Decision regarding hospitalization.   Discussed with Dr. Myna Hidalgo of hospitalist service who will admit       Tedd Sias, [place] 02/11/23 2342    Merryl Hacker, MD 02/12/23 [phone]

## 2023-02-11 NOTE — ED Triage Notes (Addendum)
Pt bib ems from home; having abd pain x 3 months; scheduled to be scoped on Monday ; ate mac and cheese last night; c/o n/v/diarrhea that started after; c/o L upper and lower abd pain, worse with palpation; radiating to L back; also endorsing cp;  hx cervical spinal surgery several years ago, takes gabapentin; 4 mg Zofran, 450 fluid given pta; 110/80, 130 hr, sats 95% RA, 12 lead unremarkable

## 2023-02-12 DIAGNOSIS — R112 Nausea with vomiting, unspecified: Secondary | ICD-10-CM | POA: Diagnosis not present

## 2023-02-12 DIAGNOSIS — F341 Dysthymic disorder: Secondary | ICD-10-CM | POA: Diagnosis not present

## 2023-02-12 DIAGNOSIS — R161 Splenomegaly, not elsewhere classified: Secondary | ICD-10-CM | POA: Diagnosis not present

## 2023-02-12 DIAGNOSIS — F17210 Nicotine dependence, cigarettes, uncomplicated: Secondary | ICD-10-CM | POA: Diagnosis not present

## 2023-02-12 DIAGNOSIS — Z885 Allergy status to narcotic agent status: Secondary | ICD-10-CM | POA: Diagnosis not present

## 2023-02-12 DIAGNOSIS — I959 Hypotension, unspecified: Secondary | ICD-10-CM | POA: Diagnosis not present

## 2023-02-12 DIAGNOSIS — Z833 Family history of diabetes mellitus: Secondary | ICD-10-CM | POA: Diagnosis not present

## 2023-02-12 DIAGNOSIS — Z8616 Personal history of COVID-19: Secondary | ICD-10-CM | POA: Diagnosis not present

## 2023-02-12 DIAGNOSIS — F419 Anxiety disorder, unspecified: Secondary | ICD-10-CM | POA: Diagnosis not present

## 2023-02-12 DIAGNOSIS — Z8249 Family history of ischemic heart disease and other diseases of the circulatory system: Secondary | ICD-10-CM | POA: Diagnosis not present

## 2023-02-12 DIAGNOSIS — Z981 Arthrodesis status: Secondary | ICD-10-CM | POA: Diagnosis not present

## 2023-02-12 DIAGNOSIS — R1032 Left lower quadrant pain: Secondary | ICD-10-CM | POA: Diagnosis present

## 2023-02-12 DIAGNOSIS — E876 Hypokalemia: Secondary | ICD-10-CM | POA: Diagnosis not present

## 2023-02-12 DIAGNOSIS — D72829 Elevated white blood cell count, unspecified: Secondary | ICD-10-CM | POA: Diagnosis not present

## 2023-02-12 DIAGNOSIS — M19032 Primary osteoarthritis, left wrist: Secondary | ICD-10-CM | POA: Diagnosis present

## 2023-02-12 DIAGNOSIS — G959 Disease of spinal cord, unspecified: Secondary | ICD-10-CM | POA: Diagnosis not present

## 2023-02-12 DIAGNOSIS — M47819 Spondylosis without myelopathy or radiculopathy, site unspecified: Secondary | ICD-10-CM | POA: Diagnosis present

## 2023-02-12 DIAGNOSIS — R000 Tachycardia, unspecified: Secondary | ICD-10-CM | POA: Diagnosis present

## 2023-02-12 DIAGNOSIS — J45909 Unspecified asthma, uncomplicated: Secondary | ICD-10-CM | POA: Diagnosis not present

## 2023-02-12 DIAGNOSIS — R197 Diarrhea, unspecified: Secondary | ICD-10-CM | POA: Diagnosis not present

## 2023-02-12 DIAGNOSIS — Z6372 Alcoholism and drug addiction in family: Secondary | ICD-10-CM | POA: Diagnosis not present

## 2023-02-12 DIAGNOSIS — A0811 Acute gastroenteropathy due to Norwalk agent: Secondary | ICD-10-CM | POA: Diagnosis not present

## 2023-02-12 DIAGNOSIS — D696 Thrombocytopenia, unspecified: Secondary | ICD-10-CM | POA: Diagnosis not present

## 2023-02-12 DIAGNOSIS — Z79899 Other long term (current) drug therapy: Secondary | ICD-10-CM | POA: Diagnosis not present

## 2023-02-12 DIAGNOSIS — Z83719 Family history of colon polyps, unspecified: Secondary | ICD-10-CM | POA: Diagnosis not present

## 2023-02-12 DIAGNOSIS — E861 Hypovolemia: Secondary | ICD-10-CM | POA: Diagnosis not present

## 2023-02-12 DIAGNOSIS — Z811 Family history of alcohol abuse and dependence: Secondary | ICD-10-CM | POA: Diagnosis not present

## 2023-02-12 DIAGNOSIS — Z86010 Personal history of colonic polyps: Secondary | ICD-10-CM | POA: Diagnosis not present

## 2023-02-12 DIAGNOSIS — D6959 Other secondary thrombocytopenia: Secondary | ICD-10-CM | POA: Diagnosis not present

## 2023-02-12 DIAGNOSIS — R109 Unspecified abdominal pain: Secondary | ICD-10-CM | POA: Diagnosis not present

## 2023-02-12 DIAGNOSIS — Z825 Family history of asthma and other chronic lower respiratory diseases: Secondary | ICD-10-CM | POA: Diagnosis not present

## 2023-02-12 LAB — BASIC METABOLIC PANEL
Anion gap: 7 (ref 5–15)
BUN: 8 mg/dL (ref 6–20)
CO2: 24 mmol/L (ref 22–32)
Calcium: 8.1 mg/dL — ABNORMAL LOW (ref 8.9–10.3)
Chloride: 102 mmol/L (ref 98–111)
Creatinine, Ser: 0.77 mg/dL (ref 0.61–1.24)
GFR, Estimated: >60 mL/min (ref >60)
Glucose, Bld: 101 mg/dL — ABNORMAL HIGH (ref 70–99)
Potassium: 3.4 mmol/L — ABNORMAL LOW (ref 3.5–5.1)
Sodium: 133 mmol/L — ABNORMAL LOW (ref 135–145)

## 2023-02-12 LAB — CBC
HCT: 39.6 % (ref 39.0–52.0)
Hemoglobin: 13.2 g/dL (ref 13.0–17.0)
MCH: 27.6 pg (ref 26.0–34.0)
MCHC: 33.3 g/dL (ref 30.0–36.0)
MCV: 82.8 fL (ref 80.0–100.0)
Platelets: 84 10*3/uL — ABNORMAL LOW (ref 150–400)
RBC: 4.78 MIL/uL (ref 4.22–5.81)
RDW: 14.4 % (ref 11.5–15.5)
WBC: 6.2 10*3/uL (ref 4.0–10.5)
nRBC: 0 % (ref 0.0–0.2)

## 2023-02-12 LAB — URINALYSIS, ROUTINE W REFLEX MICROSCOPIC
Bilirubin Urine: NEGATIVE
Glucose, UA: NEGATIVE mg/dL
Hgb urine dipstick: NEGATIVE
Ketones, ur: NEGATIVE mg/dL
Leukocytes,Ua: NEGATIVE
Nitrite: NEGATIVE
Protein, ur: NEGATIVE mg/dL
Specific Gravity, Urine: 1.039 — ABNORMAL HIGH (ref 1.005–1.030)
pH: 5 (ref 5.0–8.0)

## 2023-02-12 LAB — C DIFFICILE QUICK SCREEN W PCR REFLEX
C Diff antigen: NEGATIVE
C Diff interpretation: NOT DETECTED
C Diff toxin: NEGATIVE

## 2023-02-12 LAB — VITAMIN B12: Vitamin B-12: 390 pg/mL (ref 180–914)

## 2023-02-12 LAB — MAGNESIUM: Magnesium: 1.7 mg/dL (ref 1.7–2.4)

## 2023-02-12 MED ORDER — LACTATED RINGERS IV SOLN: INTRAVENOUS | Status: DC

## 2023-02-12 MED ORDER — POTASSIUM CHLORIDE 10 MEQ/100ML IV SOLN
10.0000 meq | INTRAVENOUS | Status: AC
  Administered 2023-02-12 (×2): 10 meq via INTRAVENOUS
  Filled 2023-02-12 (×2): qty 100

## 2023-02-12 NOTE — ED Notes (Signed)
Dr. Myna Hidalgo at the bedside to assess pt for admission

## 2023-02-12 NOTE — Hospital Course (Addendum)
52 y.o.m w/ significant for asthma,anxiety, and cervical myelopathy status post ACDF who presented to the ED with abdominal pain,nausea,vomiting, and diarrhea.He has been having frequent loose stool nausea vomiting abdominal pain for the last 3 months, has been following with [facility] for this and states that he is scheduled for upper and lower endoscopy on 02/23/2023.   Patient works night shift and had an uneventful night but then woke shortly before noon 3/20  with severe left lower quadrant abdominal pain, nausea, nonbloody vomiting, and diarrhea.He reports having roughly a dozen episodes of diarrhea today and vomited approximately 10 times.  He denies fever or chills.  Denies bleeding. ED Course: afebrile and saturating adequately on room air with elevated heart rate and systolic blood pressure in the 90s.  EKG demonstrates sinus tachycardia 328.  Labs notable for serum bicarbonate 19, anion gap 18, WBC 11,500, and normal troponin.  CT of the abdomen pelvis notable for mild splenomegaly but no acute findings. Patient was given 3 L of IV fluids, morphine, Zofran, and droperidol in the ED.  He was unable to tolerate any oral intake.  Patient was managed conservatively with IV fluids, antiemetics, stool studies sent came back positive for norovirus, seen by GI. Once tolerating diet he will be discharged home

## 2023-02-12 NOTE — Progress Notes (Signed)
PROGRESS NOTE Alan Sanders  [id number] DOB: October 29, 1971 DOA: 02/11/2023 PCP: Libby Maw, MD  Brief Narrative/Hospital Course: 52 y.o.m w/ significant for asthma,anxiety, and cervical myelopathy status post ACDF who presented to the ED with abdominal pain,nausea,vomiting, and diarrhea.He has been having frequent loose stool nausea vomiting abdominal pain for the last 3 months, has been following with [facility] for this and states that he is scheduled for upper and lower endoscopy on 02/23/2023.   Patient works night shift and had an uneventful night but then woke shortly before noon 3/20  with severe left lower quadrant abdominal pain, nausea, nonbloody vomiting, and diarrhea.He reports having roughly a dozen episodes of diarrhea today and vomited approximately 10 times.  He denies fever or chills.  Denies bleeding. ED Course: afebrile and saturating adequately on room air with elevated heart rate and systolic blood pressure in the 90s.  EKG demonstrates sinus tachycardia 328.  Labs notable for serum bicarbonate 19, anion gap 18, WBC 11,500, and normal troponin.  CT of the abdomen pelvis notable for mild splenomegaly but no acute findings. Patient was given 3 L of IV fluids, morphine, Zofran, and droperidol in the ED.  He was unable to tolerate any oral intake.    Subjective: Seen and examined, Overnight vitals/labs/events reviewed  C/o pain more on left lower quadrant Still nausea and had diarrhea all night Overnight afebrile, BP stable to soft 90s to low 100. Labs reviewed WBC improved but platelet down to 84, potassium 3.4  Assessment and Plan: Principal Problem:   Intractable nausea and vomiting Active Problems:   Asthma   Dysthymia   Cervical myelopathy (HCC)   Abdominal pain with N/V/D Symptom for 3 months followed by GI, with acute worsening, CT abdomen no acute finding.  Continue general supportive care IV fluid hydration pain control antiemetics  electrolyte replacement and diet advance as tolerated.  He is already scheduled for EGD 4/1 If symptom persistent diarrhea involve GI here  Hypokalemia will replace Thrombocytopenia-ongoing for some time which seems in 2023 as low as 123, in February 1 23-1 20s, monitor avoid heparin products for now check B12 Asthma :Stable   Anxiety on Paxil    Cervical myelopathy:S/p ACDF in May 2022,continue gabapentin and as-needed Robaxin  DVT prophylaxis: enoxaparin (LOVENOX) injection 40 mg Start: 02/12/23 1000 Code Status:   Code Status: Full Code Family Communication: plan of care discussed with patient at bedside. Patient status is:  admitted as observation but remains hospitalized for ongoing  because of ongoing abdom pain and diarrhea Level of care: Telemetry Medical   Dispo: The patient is from: Home            Anticipated disposition: TBD Objective: Vitals last 24 hrs: Vitals:   02/12/23 0054 02/12/23 0055 02/12/23 0536 02/12/23 0935  BP: 117/85  (!) 94/45 95/66  Pulse: 100  69 80  Resp: 18  18 18   Temp: 97.9 F (36.6 C)  99.3 F (37.4 C) 98.4 F (36.9 C)  TempSrc: Oral  Oral   SpO2: 98%  97% 91%  Weight:  96.7 kg    Height:  6\' 1"  (1.854 m)     Weight change:   Physical Examination: General exam: alert awake, older than stated age HEENT:Oral mucosa moist, Ear/Nose WNL grossly Respiratory system: bilaterally CLEAR BS, no use of accessory muscle Cardiovascular system: S1 & S2 +, No JVD. Gastrointestinal system: Abdomen soft,TENDER on left lower quadrant,ND, BS+ Nervous System:Alert, awake, moving extremities. Extremities: LE edema NEG,distal peripheral pulses  palpable.  Skin: No rashes,no icterus. MSK: Normal muscle bulk,tone, power  Medications reviewed:  Scheduled Meds:  enoxaparin (LOVENOX) injection  40 mg Subcutaneous Q24H   gabapentin  300 mg Oral QHS   PARoxetine  40 mg Oral Daily   sodium chloride flush  3 mL Intravenous Q12H   Continuous Infusions:   lactated ringers 100 mL/hr at 02/12/23 0946   potassium chloride 10 mEq (02/12/23 0936)   promethazine (PHENERGAN) injection (IM or IVPB) 12.5 mg (02/12/23 [id number])    Diet Order             Diet clear liquid Room service appropriate? Yes; Fluid consistency: Thin  Diet effective now                   Intake/Output Summary (Last 24 hours) at 02/12/2023 0956 Last data filed at 02/12/2023 0946 Gross per 24 hour  Intake 3249.23 ml  Output 2046 ml  Net 1203.23 ml   Net IO Since Admission: 1,203.23 mL [02/12/23 0956]  Wt Readings from Last 3 Encounters:  02/12/23 96.7 kg  01/27/23 93.4 kg  01/22/23 93.4 kg     Unresulted Labs (From admission, onward)     Start     Ordered   02/18/23 0500  Creatinine, serum  (enoxaparin (LOVENOX)    CrCl >/= 30 ml/min)  Weekly,   R     Comments: while on enoxaparin therapy    02/11/23 2342   02/12/23 0942  Gastrointestinal Panel by PCR , Stool  (Gastrointestinal Panel by PCR, Stool                                                                                                                                                     **Does Not include CLOSTRIDIUM DIFFICILE testing. **If CDIFF testing is needed, place order from the "C Difficile Testing" order set.**)  Once,   R        02/12/23 0941   02/12/23 0941  C Difficile Quick Screen w PCR reflex  (C Difficile quick screen w PCR reflex panel )  Once, for 24 hours,   TIMED       References:    CDiff Information Tool   02/12/23 0941   02/12/23 0850  Vitamin B12  Add-on,   AD        02/12/23 0849   02/12/23 [id number]  Basic metabolic panel  Daily,   R      02/11/23 2342   02/12/23 0500  CBC  Daily,   R      02/11/23 2342          Data Reviewed: I have personally reviewed following labs and imaging studies CBC: Recent Labs  Lab 02/11/23 1323 02/12/23 0128  WBC 11.5* 6.2  NEUTROABS 10.4*  --   HGB 16.4 13.2  HCT 49.2 39.6  MCV 83.5 82.8  PLT 123* 84*   Basic Metabolic Panel: Recent Labs   Lab 02/11/23 1323 02/12/23 0128  NA 136 133*  K 3.5 3.4*  CL 99 102  CO2 19* 24  GLUCOSE 140* 101*  BUN 9 8  CREATININE 0.95 0.77  CALCIUM 9.2 8.1*  MG  --  1.7   GFR: Estimated Creatinine Clearance: 133.8 mL/min (by C-G formula based on SCr of 0.77 mg/dL). Liver Function Tests: Recent Labs  Lab 02/11/23 1323  AST 41  ALT 44  ALKPHOS 79  BILITOT 1.2  PROT 7.5  ALBUMIN 4.5   Recent Labs  Lab 02/11/23 1323  LIPASE 28   No results found for this or any previous visit (from the past 240 hour(s)).  Antimicrobials: Anti-infectives (From admission, onward)    None      Culture/Microbiology    Component Value Date/Time   SDES THROAT 08/03/2016 0705   SPECREQUEST NONE Reflexed from [id number] 08/03/2016 0705   CULT  08/03/2016 0705    NO GROUP A STREP (S.PYOGENES) ISOLATED Performed at [facility] 08/05/2016 FINAL 08/03/2016 0705    Radiology Studies: CT ABDOMEN PELVIS W CONTRAST  Result Date: 02/11/2023 CLINICAL DATA:  Acute left-sided abdominal pain. EXAM: CT ABDOMEN AND PELVIS WITH CONTRAST TECHNIQUE: Multidetector CT imaging of the abdomen and pelvis was performed using the standard protocol following bolus administration of intravenous contrast. RADIATION DOSE REDUCTION: This exam was performed according to the departmental dose-optimization program which includes automated exposure control, adjustment of the mA and/or kV according to patient size and/or use of iterative reconstruction technique. CONTRAST:  55mL OMNIPAQUE IOHEXOL 350 MG/ML SOLN COMPARISON:  January 03, 2023. FINDINGS: Lower chest: No acute abnormality. Hepatobiliary: No focal liver abnormality is seen. No gallstones, gallbladder wall thickening, or biliary dilatation. Pancreas: Unremarkable. No pancreatic ductal dilatation or surrounding inflammatory changes. Spleen: Mild splenomegaly. Adrenals/Urinary Tract: Adrenal glands are unremarkable. Kidneys are normal, without renal  calculi, focal lesion, or hydronephrosis. Bladder is unremarkable. Stomach/Bowel: Stomach is within normal limits. Appendix appears normal. No evidence of bowel wall thickening, distention, or inflammatory changes. Vascular/Lymphatic: No significant vascular findings are present. No enlarged abdominal or pelvic lymph nodes. Reproductive: Prostate is unremarkable. Other: No abdominal wall hernia or abnormality. No abdominopelvic ascites. Musculoskeletal: No acute or significant osseous findings. IMPRESSION: Mild splenomegaly. No definite acute abnormality seen in the abdomen or pelvis. Electronically Signed   By: Marijo Conception M.D.   On: 02/11/2023 16:11     LOS: 0 days   Antonieta Pert, MD [facility]  02/12/2023, 9:56 AM

## 2023-02-12 NOTE — Progress Notes (Signed)
New Admission Note:   Arrival Method: From [facility] ED via stretcher Mental Orientation: Alert and oriented x4 Telemetry: Box #7 Assessment: Completed Skin: Intact IV: Rt FA Pain: 10/10 Tubes: N/A Safety Measures: Safety Fall Prevention Plan has been discussed.  Admission: Completed 5MW Orientation: Patient has been oriented to the room, unit and staff.  Family: None at bedside  Orders have been reviewed and implemented. Will continue to monitor the patient. Call light has been placed within reach and bed alarm has been activated.   Laurice Kimmons [employer], RN-BC Phone number: [phone]

## 2023-02-12 NOTE — ED Notes (Signed)
Receiving RN Charlotte Sanes has agreed to accept TOC once pt has arrived to inpatient unit, all questions and concerns address. Transport called to take pt to [facility]

## 2023-02-12 NOTE — TOC Initial Note (Signed)
Transition of Care [facility]) - Initial/Assessment Note    Patient Details  Name: Alan Sanders MRN: [id number] Date of Birth: 10/04/1971  [facility] CM/SW Contact:    Verdell Carmine, RN Phone Number: 02/12/2023, 11:06 AM  Clinical Narrative:                  Transitions of Care (TOC) has reviewed the patient and found no needs. The patient will be discussed in daily progressive rounds. If a need is identified, please place a TOC consult       Patient Goals and CMS Choice            Expected Discharge Plan and Services                                              Prior Living Arrangements/Services                       Activities of Daily Living   ADL Screening (condition at time of admission) Patient's cognitive ability adequate to safely complete daily activities?: Yes Is the patient deaf or have difficulty hearing?: No Does the patient have difficulty seeing, even when wearing glasses/contacts?: No Does the patient have difficulty concentrating, remembering, or making decisions?: No Patient able to express need for assistance with ADLs?: Yes Does the patient have difficulty dressing or bathing?: No Independently performs ADLs?: Yes (appropriate for developmental age) Does the patient have difficulty walking or climbing stairs?: No Weakness of Legs: Both Weakness of Arms/Hands: None  Permission Sought/Granted                  Emotional Assessment              Admission diagnosis:  Hypovolemia [E86.1] Nausea vomiting and diarrhea [R11.2, R19.7] Intractable nausea and vomiting [R11.2] Patient Active Problem List   Diagnosis Date Noted   Intractable nausea and vomiting 02/11/2023   Personal history of colonic polyps 01/22/2023   Loss of weight 01/22/2023   Genetic testing 05/19/2022   History of colonic polyps 05/08/2022   Family history of colonic polyps 05/08/2022   Need for shingles vaccine 02/27/2022    Screen for colon cancer 02/27/2022   Strain of lumbar region 02/27/2022   Peyronie's disease 02/27/2022   Pain in both feet 02/27/2022   Work related injury 12/10/2021   Insomnia secondary to depression with anxiety 05/13/2021   Cervical myelopathy ([place]) 04/16/2021   Seasonal allergic rhinitis due to pollen 02/18/2021   Dysthymia 02/18/2021   Cough 02/13/2015   Thrombocytopenia ([place]) 02/13/2015   Chest pain 02/12/2015   Asthma 02/12/2015   Tobacco abuse 02/12/2015   Nausea vomiting and diarrhea 02/12/2015   Pain in the chest    PCP:  Libby Maw, MD Pharmacy:   [facility] [place] [address] Phone: [phone] Fax: [phone]  [facility] [address] [place] [address] Phone: [phone] Fax: [phone]  [id number] - [place], [address] [place] [address] Phone: [phone] Fax: [phone]  [id number] - [place], [place] - [address] [place] [address] Phone: [phone] Fax: [place] [phone] -  [place], [place] [address] [place] [address] Phone: [phone] Fax: [phone]     Social Determinants of Health (SDOH) Social History: SDOH Screenings   Depression (PHQ2-9): Low Risk  (01/12/2023)  Tobacco Use: High Risk (02/11/2023)   SDOH Interventions:     Readmission Risk Interventions     No data to display

## 2023-02-12 NOTE — ED Notes (Signed)
Pt would prefer to try oxycodone as morphine did not ease his pain earlier.

## 2023-02-12 NOTE — ED Notes (Signed)
ED TO INPATIENT HANDOFF REPORT  ED Nurse Name and Phone #: [phone] [facility]  S Name/Age/Gender Alan Sanders 52 y.o. male Room/Bed: RESUSC/RESUSC  Code Status   Code Status: Full Code  Home/SNF/Other Home Patient oriented to: self, place, time, and situation Is this baseline? Yes   Triage Complete: Triage complete  Chief Complaint Intractable nausea and vomiting [R11.2]  Triage Note Pt bib ems from home; having abd pain x 3 months; scheduled to be scoped on Monday ; ate mac and cheese last night; c/o n/v/diarrhea that started after; c/o L upper and lower abd pain, worse with palpation; radiating to L back; also endorsing cp;  hx cervical spinal surgery several years ago, takes gabapentin; 4 mg Zofran, 450 fluid given pta; 110/80, 130 hr, sats 95% RA, 12 lead unremarkable   Allergies Allergies  Allergen Reactions   Hydromorphone Itching and Rash    Level of Care/Admitting Diagnosis ED Disposition     ED Disposition  Admit   Condition  --   [place]: [place] [[id number]]  Level of Care: Telemetry Medical [104]  May place patient in observation at [facility] or [place] if equivalent level of care is available:: Yes  Covid Evaluation: Asymptomatic - no recent exposure (last 10 days) testing not required  Diagnosis: Intractable nausea and vomiting [id number]  Admitting Physician: Vianne Bulls [id number]  Attending Physician: Vianne Bulls [id number]          B Medical/Surgery History Past Medical History:  Diagnosis Date   Anxiety    situational anxiety-on meds   Arthritis    neck/back/LEFT wrist   Asthma    PRN inhaler   Balance problem    Depression    on meds   Family history of colonic polyps 05/08/2022   Gait difficulty    GERD (gastroesophageal reflux disease)    OTC PRN meds   Headache    migraines   History of colonic polyps 05/08/2022   Inguinal hernia of left side without obstruction or gangrene     Pneumonia    Seasonal allergies    Seizures ([place])    " its been along time ,since my last seizure "   Spleen enlarged    Thrombocytopenia ([place])    Tobacco abuse    Past Surgical History:  Procedure Laterality Date   ANTERIOR CERVICAL DECOMP/DISCECTOMY FUSION N/A 04/16/2021   Procedure: Cervical three-four  Anterior cervical decompression/discectomy/fusion;  Surgeon: Karsten Ro, DO;  Location: [place];  Service: Neurosurgery;  Laterality: N/A;   COLONOSCOPY  03/2022   [place]-MAC-suprep(adeq)-many TA's-1 piecemeal   INGUINAL HERNIA REPAIR Right 2006   POLYPECTOMY  03/2022   many TA's-1 piecemeal   SURAL NERVE BX Right 12/11/2020   Procedure: SURAL NERVE BIOPSY;  Surgeon: Karsten Ro, DO;  Location: [place];  Service: Neurosurgery;  Laterality: Right;     A IV Location/Drains/Wounds Patient Lines/Drains/Airways Status     Active Line/Drains/Airways     Name Placement date Placement time Site Days   Peripheral IV 02/11/23 18 G Anterior;Distal;Right Forearm 02/11/23  1331  Forearm  1            Intake/Output Last 24 hours  Intake/Output Summary (Last 24 hours) at 02/12/2023 0003 Last data filed at 02/11/2023 2355 Gross per 24 hour  Intake 2103.59 ml  Output 650 ml  Net 1453.59 ml    Labs/Imaging Results for orders placed or performed during the hospital encounter of 02/11/23 (from the  past 48 hour(s))  Comprehensive metabolic panel     Status: Abnormal   Collection Time: 02/11/23  1:23 PM  Result Value Ref Range   Sodium 136 135 - 145 mmol/L   Potassium 3.5 3.5 - 5.1 mmol/L   Chloride 99 98 - 111 mmol/L   CO2 19 (L) 22 - 32 mmol/L   Glucose, Bld 140 (H) 70 - 99 mg/dL    Comment: Glucose reference range applies only to samples taken after fasting for at least 8 hours.   BUN 9 6 - 20 mg/dL   Creatinine, Ser 0.95 0.61 - 1.24 mg/dL   Calcium 9.2 8.9 - 10.3 mg/dL   Total Protein 7.5 6.5 - 8.1 g/dL   Albumin 4.5 3.5 - 5.0 g/dL   AST 41 15 - 41 U/L   ALT 44 0 - 44 U/L    Alkaline Phosphatase 79 38 - 126 U/L   Total Bilirubin 1.2 0.3 - 1.2 mg/dL   GFR, Estimated >60 >60 mL/min    Comment: (NOTE) Calculated using the CKD-EPI Creatinine Equation (2021)    Anion gap 18 (H) 5 - 15    Comment: Performed at [facility] [address] [place], [place] [address]  CBC with Differential     Status: Abnormal   Collection Time: 02/11/23  1:23 PM  Result Value Ref Range   WBC 11.5 (H) 4.0 - 10.5 K/uL   RBC 5.89 (H) 4.22 - 5.81 MIL/uL   Hemoglobin 16.4 13.0 - 17.0 g/dL   HCT 49.2 39.0 - 52.0 %   MCV 83.5 80.0 - 100.0 fL   MCH 27.8 26.0 - 34.0 pg   MCHC 33.3 30.0 - 36.0 g/dL   RDW 14.1 11.5 - 15.5 %   Platelets 123 (L) 150 - 400 K/uL    Comment: REPEATED TO VERIFY   nRBC 0.0 0.0 - 0.2 %   Neutrophils Relative % 90 %   Neutro Abs 10.4 (H) 1.7 - 7.7 K/uL   Lymphocytes Relative 4 %   Lymphs Abs 0.4 (L) 0.7 - 4.0 K/uL   Monocytes Relative 5 %   Monocytes Absolute 0.5 0.1 - 1.0 K/uL   Eosinophils Relative 1 %   Eosinophils Absolute 0.1 0.0 - 0.5 K/uL   Basophils Relative 0 %   Basophils Absolute 0.0 0.0 - 0.1 K/uL   Immature Granulocytes 0 %   Abs Immature Granulocytes 0.05 0.00 - 0.07 K/uL    Comment: Performed at [facility], [place] [address] [place], [place] [address]  Lipase, blood     Status: None   Collection Time: 02/11/23  1:23 PM  Result Value Ref Range   Lipase 28 11 - 51 U/L    Comment: Performed at [facility] [address] [place], [place] [address]  Troponin I (High Sensitivity)     Status: None   Collection Time: 02/11/23  1:23 PM  Result Value Ref Range   Troponin I (High Sensitivity) <2 <18 ng/L    Comment: (NOTE) Elevated high sensitivity troponin I (hsTnI) values and significant  changes across serial measurements may suggest ACS but many other  chronic and acute conditions are known to elevate hsTnI results.  Refer to the "Links" section for chest pain algorithms and additional  guidance. Performed at [facility], [place] [address], [place], [place] [address]   Troponin I (High Sensitivity)     Status: None   Collection Time: 02/11/23  3:31 PM  Result Value Ref Range   Troponin I (High Sensitivity) 4 <18 ng/L    Comment: (NOTE) Elevated high sensitivity troponin I (hsTnI) values and significant  changes across serial measurements may suggest ACS but many other  chronic and acute conditions are known to elevate hsTnI results.  Refer to the "Links" section for chest pain algorithms and additional  guidance. Performed at [facility], [place] [address], [place], [place] [address]    CT ABDOMEN PELVIS W CONTRAST  Result Date: 02/11/2023 CLINICAL DATA:  Acute left-sided abdominal pain. EXAM: CT ABDOMEN AND PELVIS WITH CONTRAST TECHNIQUE: Multidetector CT imaging of the abdomen and pelvis was performed using the standard protocol following bolus administration of intravenous contrast. RADIATION DOSE REDUCTION: This exam was performed according to the departmental dose-optimization program which includes automated exposure control, adjustment of the mA and/or kV according to patient size and/or use of iterative reconstruction technique. CONTRAST:  93mL OMNIPAQUE IOHEXOL 350 MG/ML SOLN COMPARISON:  January 03, 2023. FINDINGS: Lower chest: No acute abnormality. Hepatobiliary: No focal liver abnormality is seen. No gallstones, gallbladder wall thickening, or biliary dilatation. Pancreas: Unremarkable. No pancreatic ductal dilatation or surrounding inflammatory changes. Spleen: Mild splenomegaly. Adrenals/Urinary Tract: Adrenal glands are unremarkable. Kidneys are normal, without renal calculi, focal lesion, or hydronephrosis. Bladder is unremarkable. Stomach/Bowel: Stomach is within normal limits. Appendix appears normal. No evidence of bowel wall thickening, distention, or inflammatory changes. Vascular/Lymphatic: No significant vascular findings are present. No enlarged abdominal or pelvic lymph  nodes. Reproductive: Prostate is unremarkable. Other: No abdominal wall hernia or abnormality. No abdominopelvic ascites. Musculoskeletal: No acute or significant osseous findings. IMPRESSION: Mild splenomegaly. No definite acute abnormality seen in the abdomen or pelvis. Electronically Signed   By: Marijo Conception M.D.   On: 02/11/2023 16:11    Pending Labs Unresulted Labs (From admission, onward)     Start     Ordered   02/18/23 0500  Creatinine, serum  (enoxaparin (LOVENOX)    CrCl >/= 30 ml/min)  Weekly,   R     Comments: while on enoxaparin therapy    02/11/23 2342   02/12/23 [id number]  Basic metabolic panel  Daily,   R      02/11/23 2342   02/12/23 0500  Magnesium  Tomorrow morning,   R        02/11/23 2342   02/12/23 0500  CBC  Daily,   R      02/11/23 2342   02/11/23 2226  Urinalysis, Routine w reflex microscopic -Urine, Clean Catch  Once,   URGENT       Question:  Specimen Source  Answer:  Urine, Clean Catch   02/11/23 2225            Vitals/Pain Today's Vitals   02/11/23 2356 02/11/23 2358 02/12/23 0000 02/12/23 0002  BP:  (!) 113/93 108/78   Pulse:  98 99   Resp:  (!) 21 18   Temp: 98 F (36.7 C)     TempSrc: Oral     SpO2:  100% 100%   PainSc:    10-Worst pain ever    Isolation Precautions No active isolations  Medications Medications  lactated ringers infusion ( Intravenous New Bag/Given 02/11/23 2356)  PARoxetine (PAXIL) tablet 40 mg (has no administration in time range)  gabapentin (NEURONTIN) capsule 300 mg (has no administration in time range)  methocarbamol (ROBAXIN) tablet 750 mg (has no administration in time range)  albuterol (VENTOLIN HFA) 108 (90 Base) MCG/ACT inhaler 1-2 puff (  has no administration in time range)  enoxaparin (LOVENOX) injection 40 mg (has no administration in time range)  sodium chloride flush (NS) 0.9 % injection 3 mL (3 mLs Intravenous Given 02/11/23 2355)  acetaminophen (TYLENOL) tablet 650 mg (has no administration in time  range)    Or  acetaminophen (TYLENOL) suppository 650 mg (has no administration in time range)  oxyCODONE (Oxy IR/ROXICODONE) immediate release tablet 5 mg (has no administration in time range)  morphine (PF) 2 MG/ML injection 2 mg (has no administration in time range)  ondansetron (ZOFRAN) tablet 4 mg (has no administration in time range)    Or  ondansetron (ZOFRAN) injection 4 mg (has no administration in time range)  promethazine (PHENERGAN) 12.5 mg in sodium chloride 0.9 % 50 mL IVPB (has no administration in time range)  sodium chloride 0.9 % bolus 1,000 mL (0 mLs Intravenous Stopped 02/11/23 1521)  droperidol (INAPSINE) 2.5 MG/ML injection 2.5 mg (2.5 mg Intravenous Given 02/11/23 1400)  sodium chloride 0.9 % bolus 1,000 mL (0 mLs Intravenous Stopped 02/11/23 1844)  iohexol (OMNIPAQUE) 350 MG/ML injection 75 mL (75 mLs Intravenous Contrast Given 02/11/23 1559)  lactated ringers bolus 1,000 mL (0 mLs Intravenous Stopped 02/11/23 2011)  morphine (PF) 4 MG/ML injection 4 mg (4 mg Intravenous Given 02/11/23 2325)  ondansetron (ZOFRAN) injection 4 mg (4 mg Intravenous Given 02/11/23 2324)    Mobility walks     R Recommendations: See Admitting Provider Note  Report given to:   Additional Notes: Pt A&O x4, able to use urinal, pt is ambulatory, unable to manage pain. Pt has ate and drink with minimal nausea/vomiting.

## 2023-02-13 DIAGNOSIS — R197 Diarrhea, unspecified: Secondary | ICD-10-CM

## 2023-02-13 DIAGNOSIS — A0811 Acute gastroenteropathy due to Norwalk agent: Secondary | ICD-10-CM

## 2023-02-13 DIAGNOSIS — R161 Splenomegaly, not elsewhere classified: Secondary | ICD-10-CM

## 2023-02-13 DIAGNOSIS — Z8719 Personal history of other diseases of the digestive system: Secondary | ICD-10-CM

## 2023-02-13 DIAGNOSIS — R112 Nausea with vomiting, unspecified: Secondary | ICD-10-CM | POA: Diagnosis not present

## 2023-02-13 DIAGNOSIS — J45909 Unspecified asthma, uncomplicated: Secondary | ICD-10-CM

## 2023-02-13 DIAGNOSIS — D6959 Other secondary thrombocytopenia: Secondary | ICD-10-CM

## 2023-02-13 DIAGNOSIS — R109 Unspecified abdominal pain: Secondary | ICD-10-CM | POA: Diagnosis not present

## 2023-02-13 DIAGNOSIS — D72829 Elevated white blood cell count, unspecified: Secondary | ICD-10-CM | POA: Diagnosis not present

## 2023-02-13 DIAGNOSIS — Z86010 Personal history of colonic polyps: Secondary | ICD-10-CM

## 2023-02-13 DIAGNOSIS — K529 Noninfective gastroenteritis and colitis, unspecified: Secondary | ICD-10-CM

## 2023-02-13 LAB — GASTROINTESTINAL PANEL BY PCR, STOOL (REPLACES STOOL CULTURE)

## 2023-02-13 LAB — CBC
HCT: 38.3 % — ABNORMAL LOW (ref 39.0–52.0)
Hemoglobin: 13.3 g/dL (ref 13.0–17.0)
MCH: 28.3 pg (ref 26.0–34.0)
MCHC: 34.7 g/dL (ref 30.0–36.0)
MCV: 81.5 fL (ref 80.0–100.0)
Platelets: 87 10*3/uL — ABNORMAL LOW (ref 150–400)
RBC: 4.7 MIL/uL (ref 4.22–5.81)
RDW: 14.6 % (ref 11.5–15.5)
WBC: 3.9 10*3/uL — ABNORMAL LOW (ref 4.0–10.5)
nRBC: 0 % (ref 0.0–0.2)

## 2023-02-13 LAB — BASIC METABOLIC PANEL
Anion gap: 5 (ref 5–15)
BUN: <5 mg/dL — ABNORMAL LOW (ref 6–20)
CO2: 28 mmol/L (ref 22–32)
Calcium: 8.5 mg/dL — ABNORMAL LOW (ref 8.9–10.3)
Chloride: 103 mmol/L (ref 98–111)
Creatinine, Ser: 0.69 mg/dL (ref 0.61–1.24)
GFR, Estimated: >60 mL/min (ref >60)
Glucose, Bld: 88 mg/dL (ref 70–99)
Potassium: 4.1 mmol/L (ref 3.5–5.1)
Sodium: 136 mmol/L (ref 135–145)

## 2023-02-13 MED ORDER — LOPERAMIDE HCL 2 MG PO CAPS: 4.0000 mg | ORAL_CAPSULE | Freq: Three times a day (TID) | ORAL | Status: DC | PRN

## 2023-02-13 MED ORDER — PANTOPRAZOLE SODIUM 40 MG PO TBEC
40.0000 mg | DELAYED_RELEASE_TABLET | Freq: Every day | ORAL | Status: DC
  Administered 2023-02-13 – 2023-02-14 (×2): 40 mg via ORAL
  Filled 2023-02-13 (×2): qty 1

## 2023-02-13 NOTE — Consult Note (Addendum)
Consultation  Referring Provider:   Dr. Lupita Leash Primary Care Physician:  Libby Maw, MD Primary Gastroenterologist:  Dr. Candis Schatz       Reason for Consultation:     Abdominal pain, diarrhea, nausea and vomiting   Impression    Abdominal pain, diarrhea, nausea and vomiting with leukocytosis + Norovirus on GI pathogen panel CT abdomen pelvis with splenomegaly otherwise unremarkable C. difficile negative Mild leukocytosis, no anemia Planned outpatient colonoscopy endoscopy 04/1  Personal history of adenomatous polyps Colonoscopy 03/2022 with 11 tubular adenomatous polyps recall 6 months.  History of GERD  Thrombocytopenia secondary to splenomegaly Normal liver Has been persistent Unknown cause  Principal Problem:   Intractable nausea and vomiting Active Problems:   Asthma   Dysthymia   Cervical myelopathy (HCC)    LOS: 1 day     Plan   -52 year old male with history of loose stools, nausea vomiting worsening the last 2 months with acute abdominal pain and worsening diarrhea over the last 2 days, normal CT abdomen pelvis, positive norovirus. -Likely this acuity is from norovirus which explains all of his symptoms and mild leukocytosis, counseled patient this is self-limiting. -Continue supportive care with IV fluids, Zofran, Imodium. -PPI twice daily -Since patient has acute infection no evidence of anemia, no plan for inpatient endoscopic evaluation, no keep outpatient endoscopic evaluation planned with Dr. Candis Schatz 04/01 -Please call our office if any worsening symptoms or unable to complete prep.  Thank you for your kind consultation, we will continue to follow.         HPI:   Alan Sanders is a 52 y.o. male with past medical history significant for anxiety, depression, seizures, cervical myelopathy status post ACDF 2022, thrombocytopenia, splenomegaly, GERD, personal history of adenomatous polyps presents to the ER with abdominal pain, nausea  vomiting and diarrhea.  Patient known to Dr. Candis Schatz, colonoscopy 03/2022 or patient had 10 polyps 4 to 8 mm in size and a 15 mm polyp sigmoid, one 8 mm polyp sigmoid which were all tubular adenomatous polyps but 1 suggesting recall 6 months. Seen recently in our office and was scheduled for EGD and colonoscopy 02/23/2023 for nausea, vomiting abdominal pain loose stools and weight loss.  No patient at bedside Patient presented to ER on 03/20 with left lower quadrant abdominal pain, nausea, nonbloody emesis and diarrhea.   States he has been having 2-5 bowel movements during the day, occasional nocturnal symptoms, until several days ago began to have up to 10 times daily, had fecal incontinence, stools worse with food.  Very watery.  Associate with nausea and vomiting.  Having abdominal bloating left lower quadrant abdominal pain into his left flank which was new and worse than prior. Patient states he also had flu COVID pneumonia was in [facility] 2 months ago.  Diarrhea has been getting worse since that time. Patient had vomiting last night denies any further today. Denies GERD, occ dysphagia.  Denies melena or hematochezia. Denies fever or chills. No NSAIDS, no ETOH, no drug use. Tobacco use.    In the ER patient had sinus tachycardia, systolic blood pressure in the 90s, afebrile.  Labs with leukocytosis 11.5, no anemia, thrombocytopenia platelets 123 Normal troponin Urine unremarkable for infection. CT of the abdomen and pelvis showed mild splenomegaly but no inflammation of stomach, intestines. C. difficile negative, pending GI pathogen panel  Colonoscopy 03/2022: - Ten 4 to 8 mm polyps at the recto-sigmoid colon, in the descending colon, at  the splenic flexure, in the transverse colon and at the hepatic flexure, removed with a cold snare. Resected and retrieved. - One 20 mm polyp in the distal transverse colon, removed using injection-lift and a hot snare. Resected and  retrieved. Clips (MR conditional) were placed. Tattooed. - One 15 mm polyp in the sigmoid colon, removed with a hot snare. Resected and retrieved. Biopsied. - One 8 mm polyp in the sigmoid colon, removed with a cold snare. Resected and retrieved. - The examined portion of the ileum was normal. - The distal rectum and anal verge are normal on retroflexion view.   1. Surgical [P], colon, recto-sigmoid, descending, splenic flexure, transverse, and hepatic flexure, polyp (10) - TUBULAR ADENOMAS, MULTIPLE ADENOMATOUS FRAGMENTS, NEGATIVE FOR HIGH-GRADE DYSPLASIA. 2. Surgical [P], colon, transverse, polyp (1) - TUBULAR ADENOMA, NEGATIVE FOR HIGH-GRADE DYSPLASIA. 3. Surgical [P], colon, sigmoid, polyp (1) - TUBULAR ADENOMA, NEGATIVE FOR HIGH-GRADE DYSPLASIA. 4. Surgical [P], colon, sigmoid, polyp (1) - TUBULOVILLOUS ADENOMA, NEGATIVE FOR HIGH-GRADE DYSPLASIA, PEDUNCULATED (STALK MARGIN NEGATIVE FOR ADENOMATOUS CHANGE). 5. Surgical [P], colon, sigmoid, polyp (1) SUGGESTIVE OF GOBLET CELL-RICH HYPERPLASTIC POLYP.   Repeat recommended in 6 months.  Abnormal ED labs: Abnormal Labs Reviewed  COMPREHENSIVE METABOLIC PANEL - Abnormal; Notable for the following components:      Result Value   CO2 19 (*)    Glucose, Bld 140 (*)    Anion gap 18 (*)    All other components within normal limits  CBC WITH DIFFERENTIAL/PLATELET - Abnormal; Notable for the following components:   WBC 11.5 (*)    RBC 5.89 (*)    Platelets 123 (*)    Neutro Abs 10.4 (*)    Lymphs Abs 0.4 (*)    All other components within normal limits  URINALYSIS, ROUTINE W REFLEX MICROSCOPIC - Abnormal; Notable for the following components:   Specific Gravity, Urine 1.039 (*)    All other components within normal limits  BASIC METABOLIC PANEL - Abnormal; Notable for the following components:   Sodium 133 (*)    Potassium 3.4 (*)    Glucose, Bld 101 (*)    Calcium 8.1 (*)    All other components within normal limits  CBC - Abnormal;  Notable for the following components:   Platelets 84 (*)    All other components within normal limits  BASIC METABOLIC PANEL - Abnormal; Notable for the following components:   BUN <5 (*)    Calcium 8.5 (*)    All other components within normal limits  CBC - Abnormal; Notable for the following components:   WBC 3.9 (*)    HCT 38.3 (*)    Platelets 87 (*)    All other components within normal limits     Past Medical History:  Diagnosis Date   Anxiety    situational anxiety-on meds   Arthritis    neck/back/LEFT wrist   Asthma    PRN inhaler   Balance problem    Depression    on meds   Family history of colonic polyps 05/08/2022   Gait difficulty    GERD (gastroesophageal reflux disease)    OTC PRN meds   Headache    migraines   History of colonic polyps 05/08/2022   Inguinal hernia of left side without obstruction or gangrene    Pneumonia    Seasonal allergies    Seizures ([place])    " its been along time ,since my last seizure "   Spleen enlarged    Thrombocytopenia ([place])  Tobacco abuse     Surgical History:  He  has a past surgical history that includes Sural nerve bx (Right, 12/11/2020); Anterior cervical decomp/discectomy fusion (N/A, 04/16/2021); Inguinal hernia repair (Right, 2006); Colonoscopy (03/2022); and Polypectomy (03/2022). Family History:  His family history includes Alcoholism in his brother; COPD in his mother; Colon polyps in his father; Diabetes in his mother; Hypertension in his mother. Social History:   reports that he has been smoking cigarettes. He has a 12.00 pack-year smoking history. His smokeless tobacco use includes snuff. He reports that he does not drink alcohol and does not use drugs.  Prior to Admission medications   Medication Sig Start Date End Date Taking? Authorizing Provider  acetaminophen (TYLENOL) 325 MG tablet Take 650 mg by mouth as needed for headache.   Yes [provider]  albuterol (VENTOLIN HFA) 108 (90 Base)  MCG/ACT inhaler Inhale 1-2 puffs into the lungs every 6 (six) hours as needed for wheezing or shortness of breath. Patient taking differently: Inhale 2-3 puffs into the lungs as needed for wheezing or shortness of breath. 07/18/22  Yes Haydee Salter, MD  gabapentin (NEURONTIN) 300 MG capsule Take 1 capsule (300 mg total) by mouth 3 (three) times daily. Patient taking differently: Take 300-900 mg by mouth See admin instructions. Take 300 mg by mouth in the afternoon and 900 mg by mouth at bedtime. 08/26/22  Yes Libby Maw, MD  methocarbamol (ROBAXIN) 750 MG tablet Take 1 tablet (750 mg total) by mouth 3 (three) times daily as needed. Patient taking differently: Take 750-1,500 mg by mouth See admin instructions. Take 750 mg by mouth in the morning and 1500 mg by mouth at bedtime. 10/06/22  Yes   ondansetron (ZOFRAN-ODT) 4 MG disintegrating tablet Take 1 tablet (4 mg total) by mouth every 8 (eight) hours as needed for nausea or vomiting. 12/30/22  Yes Horton, Barbette Hair, MD  PARoxetine (PAXIL) 40 MG tablet Take 1 tablet (40 mg total) by mouth every morning. Patient taking differently: Take 40 mg by mouth in the morning and at bedtime. 01/12/23  Yes Libby Maw, MD  amoxicillin-clavulanate (AUGMENTIN) 875-125 MG tablet Take 1 tablet by mouth every 12 (twelve) hours. Patient not taking: Reported on 02/13/2023 01/27/23   Dorie Rank, MD  gabapentin (NEURONTIN) 100 MG capsule Take 1 capsule (100 mg total) by mouth every morning. Patient not taking: Reported on 01/22/2023 10/06/22   Dawley, Troy C, DO  gabapentin (NEURONTIN) 300 MG capsule Take 1 capsule (300 mg total) by mouth every evening. 10/06/22   Dawley, Troy C, DO  ibuprofen (ADVIL) 800 MG tablet Take 1 tablet (800 mg total) by mouth 3 (three) times daily. Patient not taking: Reported on 02/13/2023 10/25/22   Redwine, Madison A, PA-C  metoCLOPramide (REGLAN) 10 MG tablet Take 1 tablet (10 mg total) by mouth every 6 (six) hours as needed  for nausea or vomiting. Patient not taking: Reported on 02/13/2023 01/22/23   Zehr, Laban Emperor, PA-C  naproxen (NAPROSYN) 375 MG tablet Take 1 tablet (375 mg total) by mouth 2 (two) times daily. Patient not taking: Reported on 01/12/2023 01/04/23   Dorie Rank, MD  ondansetron (ZOFRAN) 4 MG tablet Take 1 tablet (4 mg total) by mouth every 6 (six) hours. Patient not taking: Reported on 01/22/2023 10/25/22   Redwine, Madison A, PA-C  QUEtiapine (SEROQUEL) 25 MG tablet TAKE 1 TABLET BY MOUTH EVERYDAY AT BEDTIME Patient not taking: Reported on 12/30/2022 03/21/22   Libby Maw, MD  tiZANidine (ZANAFLEX) 4 MG tablet Take 1 tablet by mouth every 6 - 8 hours as needed **Do not exceed 3 doses in 24 hours Patient not taking: Reported on 01/22/2023 05/22/22       Current Facility-Administered Medications  Medication Dose Route Frequency Provider Last Rate Last Admin   acetaminophen (TYLENOL) tablet 650 mg  650 mg Oral Q6H PRN Opyd, Ilene Qua, MD   650 mg at 02/13/23 [id number]   Or   acetaminophen (TYLENOL) suppository 650 mg  650 mg Rectal Q6H PRN Opyd, Ilene Qua, MD       albuterol (PROVENTIL) (2.5 MG/3ML) 0.083% nebulizer solution 2.5 mg  2.5 mg Inhalation Q6H PRN Opyd, Ilene Qua, MD       enoxaparin (LOVENOX) injection 40 mg  40 mg Subcutaneous Q24H Opyd, Ilene Qua, MD   40 mg at 02/12/23 0932   gabapentin (NEURONTIN) capsule 300 mg  300 mg Oral QHS Opyd, Ilene Qua, MD   300 mg at 02/12/23 2153   lactated ringers infusion   Intravenous Continuous Antonieta Pert, MD 100 mL/hr at 02/13/23 0636 New Bag at 02/13/23 0636   methocarbamol (ROBAXIN) tablet 750 mg  750 mg Oral TID PRN Vianne Bulls, MD   750 mg at 02/12/23 1242   morphine (PF) 2 MG/ML injection 2 mg  2 mg Intravenous Q2H PRN Opyd, Ilene Qua, MD   2 mg at 02/13/23 0757   ondansetron (ZOFRAN) tablet 4 mg  4 mg Oral Q6H PRN Opyd, Ilene Qua, MD       Or   ondansetron (ZOFRAN) injection 4 mg  4 mg Intravenous Q6H PRN Opyd, Ilene Qua, MD   4 mg at  02/12/23 [id number]   oxyCODONE (Oxy IR/ROXICODONE) immediate release tablet 5 mg  5 mg Oral Q4H PRN Opyd, Ilene Qua, MD   5 mg at 02/12/23 2153   PARoxetine (PAXIL) tablet 40 mg  40 mg Oral Daily Opyd, Ilene Qua, MD   40 mg at 02/12/23 0932   promethazine (PHENERGAN) 12.5 mg in sodium chloride 0.9 % 50 mL IVPB  12.5 mg Intravenous Q6H PRN Opyd, Ilene Qua, MD 200 mL/hr at 02/12/23 0814 12.5 mg at 02/12/23 0814   sodium chloride flush (NS) 0.9 % injection 3 mL  3 mL Intravenous Q12H Opyd, Ilene Qua, MD   3 mL at 02/11/23 2355    Allergies as of 02/11/2023 - Review Complete 02/11/2023  Allergen Reaction Noted   Hydromorphone Itching and Rash 12/18/2021    Review of Systems:    Constitutional: No weight loss, fever, chills, weakness or fatigue HEENT: Eyes: No change in vision               Ears, Nose, Throat:  No change in hearing or congestion Skin: No rash or itching Cardiovascular: No chest pain, chest pressure or palpitations   Respiratory: No SOB or cough Gastrointestinal: See HPI and otherwise negative Genitourinary: No dysuria or change in urinary frequency Neurological: No headache, dizziness or syncope Musculoskeletal: No new muscle or joint pain Hematologic: No bleeding or bruising Psychiatric: No history of depression or anxiety     Physical Exam:  Vital signs in last 24 hours: Temp:  [97.5 F (36.4 C)-98 F (36.7 C)] 98 F (36.7 C) (03/22 0908) Pulse Rate:  [70-104] 70 (03/22 0908) Resp:  [15-20] 18 (03/22 0908) BP: (100-166)/(73-153) 100/73 (03/22 0908) SpO2:  [95 %-99 %] 95 % (03/22 0908) Weight:  [89.9 kg] 89.9 kg (03/22 0455) Last BM Date : 02/12/23 Last  BM recorded by nurses in past 5 days No data recorded  General:   Pleasant, well developed male in no acute distress Head:  Normocephalic and atraumatic. Eyes: sclerae anicteric,conjunctive pink  Heart:  regular rate and rhythm Pulm: Clear anteriorly; no wheezing Abdomen:  Soft, Obese AB, Active bowel sounds. mild  tenderness in the LLQ. Without guarding and Without rebound, No organomegaly appreciated. Extremities:  Without edema. Msk:  Symmetrical without gross deformities. Peripheral pulses intact.  Neurologic:  Alert and  oriented x4;  No focal deficits.  Skin:   Dry and intact without significant lesions or rashes. Psychiatric:  Cooperative. Normal mood and affect.  LAB RESULTS: Recent Labs    02/11/23 1323 02/12/23 0128 02/13/23 0320  WBC 11.5* 6.2 3.9*  HGB 16.4 13.2 13.3  HCT 49.2 39.6 38.3*  PLT 123* 84* 87*   BMET Recent Labs    02/11/23 1323 02/12/23 0128 02/13/23 0320  NA 136 133* 136  K 3.5 3.4* 4.1  CL 99 102 103  CO2 19* 24 28  GLUCOSE 140* 101* 88  BUN 9 8 <5*  CREATININE 0.95 0.77 0.69  CALCIUM 9.2 8.1* 8.5*   LFT Recent Labs    02/11/23 1323  PROT 7.5  ALBUMIN 4.5  AST 41  ALT 44  ALKPHOS 79  BILITOT 1.2   PT/INR No results for input(s): "LABPROT", "INR" in the last 72 hours.  STUDIES: CT ABDOMEN PELVIS W CONTRAST  Result Date: 02/11/2023 CLINICAL DATA:  Acute left-sided abdominal pain. EXAM: CT ABDOMEN AND PELVIS WITH CONTRAST TECHNIQUE: Multidetector CT imaging of the abdomen and pelvis was performed using the standard protocol following bolus administration of intravenous contrast. RADIATION DOSE REDUCTION: This exam was performed according to the departmental dose-optimization program which includes automated exposure control, adjustment of the mA and/or kV according to patient size and/or use of iterative reconstruction technique. CONTRAST:  54mL OMNIPAQUE IOHEXOL 350 MG/ML SOLN COMPARISON:  January 03, 2023. FINDINGS: Lower chest: No acute abnormality. Hepatobiliary: No focal liver abnormality is seen. No gallstones, gallbladder wall thickening, or biliary dilatation. Pancreas: Unremarkable. No pancreatic ductal dilatation or surrounding inflammatory changes. Spleen: Mild splenomegaly. Adrenals/Urinary Tract: Adrenal glands are unremarkable. Kidneys are  normal, without renal calculi, focal lesion, or hydronephrosis. Bladder is unremarkable. Stomach/Bowel: Stomach is within normal limits. Appendix appears normal. No evidence of bowel wall thickening, distention, or inflammatory changes. Vascular/Lymphatic: No significant vascular findings are present. No enlarged abdominal or pelvic lymph nodes. Reproductive: Prostate is unremarkable. Other: No abdominal wall hernia or abnormality. No abdominopelvic ascites. Musculoskeletal: No acute or significant osseous findings. IMPRESSION: Mild splenomegaly. No definite acute abnormality seen in the abdomen or pelvis. Electronically Signed   By: Marijo Conception M.D.   On: 02/11/2023 16:11     Vladimir Crofts  02/13/2023, 10:56 AM   Attending physician's note  I have taken a history, reviewed the chart and examined the patient. I performed a substantive portion of this encounter, including complete performance of at least one of the key components, in conjunction with the APP. I agree with the APP's note, impression and recommendations.   52 yr male with thrombocytopenia, splenomegaly admitted with worsening nausea, vomiting and diarrhea  GI pathogen panel positive for norovirus, likely etiology for acute exacerbation of symptoms  Continue IV fluids and supportive care with antiemetics Advance diet as tolerated  He is already scheduled for EGD and colonoscopy as outpatient next week for evaluation of chronic nausea, chronic diarrhea, to exclude microscopic colitis and also for  surveillance with history of multiple advanced adenomatous colon polyps  Okay to discharge patient home this weekend if his symptoms improve and he is tolerating p.o. intake  GI is available if needed during this weekend but will not round on this patient, please call us back if needed.    The patient was provided an opportunity to ask questions and all were answered. The patient agreed with the plan and demonstrated an understanding  of the instructions.  Damaris Hippo , MD [phone]

## 2023-02-13 NOTE — Progress Notes (Signed)
PROGRESS NOTE Alan Sanders  [id number] DOB: Sep 14, 1971 DOA: 02/11/2023 PCP: Libby Maw, MD  Brief Narrative/Hospital Course: 52 y.o.m w/ significant for asthma,anxiety, and cervical myelopathy status post ACDF who presented to the ED with abdominal pain,nausea,vomiting, and diarrhea.He has been having frequent loose stool nausea vomiting abdominal pain for the last 3 months, has been following with [facility] for this and states that he is scheduled for upper and lower endoscopy on 02/23/2023.   Patient works night shift and had an uneventful night but then woke shortly before noon 3/20  with severe left lower quadrant abdominal pain, nausea, nonbloody vomiting, and diarrhea.He reports having roughly a dozen episodes of diarrhea today and vomited approximately 10 times.  He denies fever or chills.  Denies bleeding. ED Course: afebrile and saturating adequately on room air with elevated heart rate and systolic blood pressure in the 90s.  EKG demonstrates sinus tachycardia 328.  Labs notable for serum bicarbonate 19, anion gap 18, WBC 11,500, and normal troponin.  CT of the abdomen pelvis notable for mild splenomegaly but no acute findings. Patient was given 3 L of IV fluids, morphine, Zofran, and droperidol in the ED.  He was unable to tolerate any oral intake.    Subjective: Seen and examined, Overnight vitals/labs/events reviewed  Overnight afebrile. Still complains of persistent left lower quadrant abdominal pain and diarrhea Mild nausea yesterday-none today, no vomiting Wants to eat crackers  Assessment and Plan: Principal Problem:   Intractable nausea and vomiting Active Problems:   Asthma   Dysthymia   Cervical myelopathy (HCC)   Abdominal pain with N/V/D:Symptom for 3 months followed by GI, with acute worsening, CT abdomen no acute finding.Still complains of persistent left lower quadrant abdominal pain and diarrhea- Mild nausea yesterday-none today, no  vomiting.  C. difficile negative GI panel pending, will ask his GI team to see him- continue general supportive care IV fluid hydration pain control antiemetics electrolyte replacement and diet advance as tolerated. He was scheduled for EGD 4/1  Hypokalemia resolved Thrombocytopenia-ongoing for some time which seems in 2023 as low as 123, in February 1 23-1 20s, monitor  B12 normal Recent Labs  Lab 02/11/23 1323 02/12/23 0128 02/13/23 0320  PLT 123* 84* 87*    Asthma :Stable   Anxiety on Paxil    Cervical myelopathy:S/p ACDF in May 2022,continue gabapentin and as-needed Robaxin  DVT prophylaxis: enoxaparin (LOVENOX) injection 40 mg Start: 02/12/23 1000 Code Status:   Code Status: Full Code Family Communication: plan of care discussed with patient at bedside. Patient status is: Inpatient due to persistent diarrhea abdominal pain  Level of care: Telemetry Medical  Dispo: The patient is from: Home            Anticipated disposition: TBD Objective: Vitals last 24 hrs: Vitals:   02/12/23 2145 02/13/23 0319 02/13/23 0455 02/13/23 0908  BP: (!) 131/96 128/87  100/73  Pulse: 71 81  70  Resp: 20 15  18   Temp: (!) 97.5 F (36.4 C) 97.7 F (36.5 C)  98 F (36.7 C)  TempSrc: Oral Oral  Oral  SpO2: 99% 98%  95%  Weight: 89.9 kg  89.9 kg   Height: 6\' 2"  (1.88 m)      Weight change: -6.761 kg  Physical Examination: General exam: alert awake, oriented, older than stated age HEENT:Oral mucosa moist, Ear/Nose WNL grossly Respiratory system: Bilaterally clear BS, no use of accessory muscle Cardiovascular system: S1 & S2 +, No JVD. Gastrointestinal system: Abdomen soft, slightly  tender left lower quadrant,ND, BS+ Nervous System: Alert, awake, moving extremities, he follows commands. Extremities: LE edema neg,distal peripheral pulses palpable.  Skin: No rashes,no icterus. MSK: Normal muscle bulk,tone, power   Medications reviewed:  Scheduled Meds:  enoxaparin (LOVENOX) injection  40  mg Subcutaneous Q24H   gabapentin  300 mg Oral QHS   PARoxetine  40 mg Oral Daily   sodium chloride flush  3 mL Intravenous Q12H   Continuous Infusions:  lactated ringers 100 mL/hr at 02/13/23 0636   promethazine (PHENERGAN) injection (IM or IVPB) 12.5 mg (02/12/23 [id number])    Diet Order             Diet full liquid Room service appropriate? Yes; Fluid consistency: Thin  Diet effective now                   Intake/Output Summary (Last 24 hours) at 02/13/2023 1007 Last data filed at 02/13/2023 0843 Gross per 24 hour  Intake 2586.92 ml  Output 1227 ml  Net 1359.92 ml    Net IO Since Admission: 2,563.15 mL [02/13/23 1007]  Wt Readings from Last 3 Encounters:  02/13/23 89.9 kg  01/27/23 93.4 kg  01/22/23 93.4 kg     Unresulted Labs (From admission, onward)     Start     Ordered   02/18/23 0500  Creatinine, serum  (enoxaparin (LOVENOX)    CrCl >/= 30 ml/min)  Weekly,   R     Comments: while on enoxaparin therapy    02/11/23 2342   02/12/23 0942  Gastrointestinal Panel by PCR , Stool  (Gastrointestinal Panel by PCR, Stool                                                                                                                                                     **Does Not include CLOSTRIDIUM DIFFICILE testing. **If CDIFF testing is needed, place order from the "C Difficile Testing" order set.**)  Once,   R        02/12/23 0941   02/12/23 [id number]  Basic metabolic panel  Daily,   R      02/11/23 2342   02/12/23 0500  CBC  Daily,   R      02/11/23 2342          Data Reviewed: I have personally reviewed following labs and imaging studies CBC: Recent Labs  Lab 02/11/23 1323 02/12/23 0128 02/13/23 0320  WBC 11.5* 6.2 3.9*  NEUTROABS 10.4*  --   --   HGB 16.4 13.2 13.3  HCT 49.2 39.6 38.3*  MCV 83.5 82.8 81.5  PLT 123* 84* 87*    Basic Metabolic Panel: Recent Labs  Lab 02/11/23 1323 02/12/23 0128 02/13/23 0320  NA 136 133* 136  K 3.5 3.4* 4.1  CL 99 102  103  CO2 19* 24  28  GLUCOSE 140* 101* 88  BUN 9 8 <5*  CREATININE 0.95 0.77 0.69  CALCIUM 9.2 8.1* 8.5*  MG  --  1.7  --     GFR: Estimated Creatinine Clearance: 127 mL/min (by C-G formula based on SCr of 0.69 mg/dL). Liver Function Tests: Recent Labs  Lab 02/11/23 1323  AST 41  ALT 44  ALKPHOS 79  BILITOT 1.2  PROT 7.5  ALBUMIN 4.5    Recent Labs  Lab 02/11/23 1323  LIPASE 28    Recent Results (from the past 240 hour(s))  C Difficile Quick Screen w PCR reflex     Status: None   Collection Time: 02/12/23  9:41 AM   Specimen: STOOL  Result Value Ref Range Status   C Diff antigen NEGATIVE NEGATIVE Final   C Diff toxin NEGATIVE NEGATIVE Final   C Diff interpretation No C. difficile detected.  Final    Comment: Performed at [facility], [place] [address] [place], [place] [address]    Antimicrobials: Anti-infectives (From admission, onward)    None      Culture/Microbiology    Component Value Date/Time   SDES THROAT 08/03/2016 0705   SPECREQUEST NONE Reflexed from [id number] 08/03/2016 0705   CULT  08/03/2016 0705    NO GROUP A STREP (S.PYOGENES) ISOLATED Performed at [facility] 08/05/2016 FINAL 08/03/2016 0705    Radiology Studies: CT ABDOMEN PELVIS W CONTRAST  Result Date: 02/11/2023 CLINICAL DATA:  Acute left-sided abdominal pain. EXAM: CT ABDOMEN AND PELVIS WITH CONTRAST TECHNIQUE: Multidetector CT imaging of the abdomen and pelvis was performed using the standard protocol following bolus administration of intravenous contrast. RADIATION DOSE REDUCTION: This exam was performed according to the departmental dose-optimization program which includes automated exposure control, adjustment of the mA and/or kV according to patient size and/or use of iterative reconstruction technique. CONTRAST:  84mL OMNIPAQUE IOHEXOL 350 MG/ML SOLN COMPARISON:  January 03, 2023. FINDINGS: Lower chest: No acute abnormality. Hepatobiliary: No focal liver  abnormality is seen. No gallstones, gallbladder wall thickening, or biliary dilatation. Pancreas: Unremarkable. No pancreatic ductal dilatation or surrounding inflammatory changes. Spleen: Mild splenomegaly. Adrenals/Urinary Tract: Adrenal glands are unremarkable. Kidneys are normal, without renal calculi, focal lesion, or hydronephrosis. Bladder is unremarkable. Stomach/Bowel: Stomach is within normal limits. Appendix appears normal. No evidence of bowel wall thickening, distention, or inflammatory changes. Vascular/Lymphatic: No significant vascular findings are present. No enlarged abdominal or pelvic lymph nodes. Reproductive: Prostate is unremarkable. Other: No abdominal wall hernia or abnormality. No abdominopelvic ascites. Musculoskeletal: No acute or significant osseous findings. IMPRESSION: Mild splenomegaly. No definite acute abnormality seen in the abdomen or pelvis. Electronically Signed   By: Marijo Conception M.D.   On: 02/11/2023 16:11     LOS: 1 day   Antonieta Pert, MD [facility]  02/13/2023, 10:07 AM

## 2023-02-14 DIAGNOSIS — A0811 Acute gastroenteropathy due to Norwalk agent: Secondary | ICD-10-CM | POA: Diagnosis not present

## 2023-02-14 LAB — BASIC METABOLIC PANEL
Anion gap: 4 — ABNORMAL LOW (ref 5–15)
BUN: <5 mg/dL — ABNORMAL LOW (ref 6–20)
CO2: 29 mmol/L (ref 22–32)
Calcium: 8.5 mg/dL — ABNORMAL LOW (ref 8.9–10.3)
Chloride: 104 mmol/L (ref 98–111)
Creatinine, Ser: 0.76 mg/dL (ref 0.61–1.24)
GFR, Estimated: >60 mL/min (ref >60)
Glucose, Bld: 97 mg/dL (ref 70–99)
Potassium: 4 mmol/L (ref 3.5–5.1)
Sodium: 137 mmol/L (ref 135–145)

## 2023-02-14 LAB — CBC
HCT: 37.1 % — ABNORMAL LOW (ref 39.0–52.0)
Hemoglobin: 12.5 g/dL — ABNORMAL LOW (ref 13.0–17.0)
MCH: 28 pg (ref 26.0–34.0)
MCHC: 33.7 g/dL (ref 30.0–36.0)
MCV: 83.2 fL (ref 80.0–100.0)
Platelets: 86 10*3/uL — ABNORMAL LOW (ref 150–400)
RBC: 4.46 MIL/uL (ref 4.22–5.81)
RDW: 14.4 % (ref 11.5–15.5)
WBC: 4 10*3/uL (ref 4.0–10.5)
nRBC: 0 % (ref 0.0–0.2)

## 2023-02-14 MED ORDER — PANTOPRAZOLE SODIUM 40 MG PO TBEC: 40.0000 mg | DELAYED_RELEASE_TABLET | Freq: Every day | ORAL | 0 refills | Status: DC

## 2023-02-14 MED ORDER — ONDANSETRON HCL 4 MG PO TABS: 4.0000 mg | ORAL_TABLET | Freq: Three times a day (TID) | ORAL | 0 refills | Status: AC | PRN

## 2023-02-14 NOTE — Discharge Summary (Signed)
Physician Discharge Summary  Alan Sanders [id number] DOB: 12/12/1970 DOA: 02/11/2023  PCP: Libby Maw, MD  Admit date: 02/11/2023 Discharge date: 02/14/2023 Recommendations for Outpatient Follow-up:  Follow up with PCP in 1 weeks-call for appointment Please obtain BMP/CBC in one week  Discharge Dispo: Home Discharge Condition: Stable Code Status:   Code Status: Full Code Diet recommendation:  Diet Order             Diet - low sodium heart healthy           Diet regular Room service appropriate? Yes; Fluid consistency: Thin  Diet effective now                    Brief/Interim Summary: 52 y.o.m w/ significant for asthma,anxiety, and cervical myelopathy status post ACDF who presented to the ED with abdominal pain,nausea,vomiting, and diarrhea.He has been having frequent loose stool nausea vomiting abdominal pain for the last 3 months, has been following with [facility] for this and states that he is scheduled for upper and lower endoscopy on 02/23/2023.   Patient works night shift and had an uneventful night but then woke shortly before noon 3/20  with severe left lower quadrant abdominal pain, nausea, nonbloody vomiting, and diarrhea.He reports having roughly a dozen episodes of diarrhea today and vomited approximately 10 times.  He denies fever or chills.  Denies bleeding. ED Course: afebrile and saturating adequately on room air with elevated heart rate and systolic blood pressure in the 90s.  EKG demonstrates sinus tachycardia 328.  Labs notable for serum bicarbonate 19, anion gap 18, WBC 11,500, and normal troponin.  CT of the abdomen pelvis notable for mild splenomegaly but no acute findings. Patient was given 3 L of IV fluids, morphine, Zofran, and droperidol in the ED.  He was unable to tolerate any oral intake.  Patient was managed conservatively with IV fluids, antiemetics, stool studies sent came back positive for norovirus, seen by GI. Once  tolerating diet he will be discharged home  Patient has no more nausea vomiting diarrhea tolerating diet and he is agreeable for discharge home today He feels comfortable.  Discharge Diagnoses:  Principal Problem:   Acute gastroenteropathy due to Norovirus Active Problems:   Asthma   Dysthymia   Cervical myelopathy ([place])   Intractable nausea and vomiting   Chronic diarrhea  PROGRESS NOTE Alan Sanders  [id number] DOB: 12-28-1970 DOA: 02/11/2023 PCP: Libby Maw, MD  Brief Narrative/Hospital Course: 52 y.o.m w/ significant for asthma,anxiety, and cervical myelopathy status post ACDF who presented to the ED with abdominal pain,nausea,vomiting, and diarrhea.He has been having frequent loose stool nausea vomiting abdominal pain for the last 3 months, has been following with [facility] for this and states that he is scheduled for upper and lower endoscopy on 02/23/2023.   Patient works night shift and had an uneventful night but then woke shortly before noon 3/20  with severe left lower quadrant abdominal pain, nausea, nonbloody vomiting, and diarrhea.He reports having roughly a dozen episodes of diarrhea today and vomited approximately 10 times.  He denies fever or chills.  Denies bleeding. ED Course: afebrile and saturating adequately on room air with elevated heart rate and systolic blood pressure in the 90s.  EKG demonstrates sinus tachycardia 328.  Labs notable for serum bicarbonate 19, anion gap 18, WBC 11,500, and normal troponin.  CT of the abdomen pelvis notable for mild splenomegaly but no acute findings. Patient was given 3 L of IV fluids,  morphine, Zofran, and droperidol in the ED.  He was unable to tolerate any oral intake.  Patient was managed conservatively with IV fluids, antiemetics, stool studies sent came back positive for norovirus, seen by GI. Once tolerating diet he will be discharged home    Subjective: Seen and examined, Overnight  vitals/labs/events reviewed  Overnight afebrile. Still complains of persistent left lower quadrant abdominal pain and diarrhea Mild nausea yesterday-none today, no vomiting Wants to eat crackers  Assessment and Plan: Principal Problem:   Acute gastroenteropathy due to Norovirus Active Problems:   Asthma   Dysthymia   Cervical myelopathy (HCC)   Intractable nausea and vomiting   Chronic diarrhea   Acute gastroenteritis due to norovirus with abdominal pain with N/V/D:Symptom for 3 months followed by GI, with acute worsening due to norovirus infection., CT abdomen no acute finding. C. difficile negative GI panel + Noro virus> at this time, nausea vomiting tolerating diet, on the upper extremities today but overall much better, he feels comfortable going home today He Is scheduled for EGD 4/1  Hypokalemia resolved Thrombocytopenia-ongoing for some time which seems in 2023 as low as 123, in February > remains ilow  continue to monitor as outpatient  B12 normal Recent Labs  Lab 02/11/23 1323 02/12/23 0128 02/13/23 0320 02/14/23 0158  PLT 123* 84* 87* 86*    Asthma :Stable   Anxiety on Paxil    Cervical myelopathy:S/p ACDF in May 2022,continue gabapentin and as-needed Robaxin   Consults: Gastroenterology Subjective: Alert awake oriented normal nausea vomiting, tolerating diet well, although had loose stool this morning but Overall much better  Discharge Exam: Vitals:   02/14/23 0459 02/14/23 0905  BP: 105/62 110/66  Pulse: (!) 53 62  Resp: 18 18  Temp: 98.3 F (36.8 C) 98 F (36.7 C)  SpO2: 97% 98%   General: Pt is alert, awake, not in acute distress Cardiovascular: RRR, S1/S2 +, no rubs, no gallops Respiratory: CTA bilaterally, no wheezing, no rhonchi Abdominal: Soft, NT, ND, bowel sounds + Extremities: no edema, no cyanosis  Discharge Instructions  Discharge Instructions     Diet - low sodium heart healthy   Complete by: As directed    Discharge instructions    Complete by: As directed    Please call call MD or return to ER for similar or worsening recurring problem that brought you to hospital or if any fever,nausea/vomiting,abdominal pain, uncontrolled pain, chest pain,  shortness of breath or any other alarming symptoms.  Please follow-up your doctor as instructed in a week time and call the office for appointment.  Please avoid alcohol, smoking, or any other illicit substance and maintain healthy habits including taking your regular medications as prescribed.  You were cared for by a hospitalist during your hospital stay. If you have any questions about your discharge medications or the care you received while you were in the hospital after you are discharged, you can call the unit and ask to speak with the hospitalist on call if the hospitalist that took care of you is not available.  Once you are discharged, your primary care physician will handle any further medical issues. Please note that NO REFILLS for any discharge medications will be authorized once you are discharged, as it is imperative that you return to your primary care physician (or establish a relationship with a primary care physician if you do not have one) for your aftercare needs so that they can reassess your need for medications and monitor your lab values  Increase activity slowly   Complete by: As directed       Allergies as of 02/14/2023       Reactions   Hydromorphone Itching, Rash        Medication List     STOP taking these medications    amoxicillin-clavulanate 875-125 MG tablet Commonly known as: AUGMENTIN   ibuprofen 800 MG tablet Commonly known as: ADVIL   metoCLOPramide 10 MG tablet Commonly known as: Reglan   naproxen 375 MG tablet Commonly known as: NAPROSYN   ondansetron 4 MG disintegrating tablet Commonly known as: ZOFRAN-ODT   tiZANidine 4 MG tablet Commonly known as: ZANAFLEX       TAKE these medications    acetaminophen 325 MG  tablet Commonly known as: TYLENOL Take 650 mg by mouth as needed for headache.   albuterol 108 (90 Base) MCG/ACT inhaler Commonly known as: VENTOLIN HFA Inhale 1-2 puffs into the lungs every 6 (six) hours as needed for wheezing or shortness of breath. What changed:  how much to take when to take this   gabapentin 300 MG capsule Commonly known as: NEURONTIN Take 1 capsule (300 mg total) by mouth 3 (three) times daily. What changed:  how much to take when to take this additional instructions Another medication with the same name was removed. Continue taking this medication, and follow the directions you see here.   methocarbamol 750 MG tablet Commonly known as: ROBAXIN Take 1 tablet (750 mg total) by mouth 3 (three) times daily as needed. What changed:  how much to take when to take this additional instructions   ondansetron 4 MG tablet Commonly known as: ZOFRAN Take 1 tablet (4 mg total) by mouth every 8 (eight) hours as needed for up to 7 days for nausea or vomiting. What changed:  when to take this reasons to take this   pantoprazole 40 MG tablet Commonly known as: PROTONIX Take 1 tablet (40 mg total) by mouth daily.   PARoxetine 40 MG tablet Commonly known as: Paxil Take 1 tablet (40 mg total) by mouth every morning. What changed: when to take this   QUEtiapine 25 MG tablet Commonly known as: SEROQUEL TAKE 1 TABLET BY MOUTH EVERYDAY AT BEDTIME        Follow-up Information     Libby Maw, MD Follow up in 1 week(s).   Specialty: Family Medicine Contact information: [place] [address] [phone]                Allergies  Allergen Reactions   Hydromorphone Itching and Rash    The results of significant diagnostics from this hospitalization (including imaging, microbiology, ancillary and laboratory) are listed below for reference.    Microbiology: Recent Results (from the past 240 hour(s))  C Difficile  Quick Screen w PCR reflex     Status: None   Collection Time: 02/12/23  9:41 AM   Specimen: STOOL  Result Value Ref Range Status   C Diff antigen NEGATIVE NEGATIVE Final   C Diff toxin NEGATIVE NEGATIVE Final   C Diff interpretation No C. difficile detected.  Final    Comment: Performed at [facility], [place] [address] [place], [place] [address]  Gastrointestinal Panel by PCR , Stool     Status: Abnormal   Collection Time: 02/12/23 11:26 AM   Specimen: STOOL  Result Value Ref Range Status   Campylobacter species NOT DETECTED NOT DETECTED Final   Plesimonas shigelloides NOT DETECTED NOT DETECTED Final  Salmonella species NOT DETECTED NOT DETECTED Final   Yersinia enterocolitica NOT DETECTED NOT DETECTED Final   Vibrio species NOT DETECTED NOT DETECTED Final   Vibrio cholerae NOT DETECTED NOT DETECTED Final   Enteroaggregative E coli (EAEC) NOT DETECTED NOT DETECTED Final   Enteropathogenic E coli (EPEC) NOT DETECTED NOT DETECTED Final   Enterotoxigenic E coli (ETEC) NOT DETECTED NOT DETECTED Final   Shiga like toxin producing E coli (STEC) NOT DETECTED NOT DETECTED Final   Shigella/Enteroinvasive E coli (EIEC) NOT DETECTED NOT DETECTED Final   Cryptosporidium NOT DETECTED NOT DETECTED Final   Cyclospora cayetanensis NOT DETECTED NOT DETECTED Final   Entamoeba histolytica NOT DETECTED NOT DETECTED Final   Giardia lamblia NOT DETECTED NOT DETECTED Final   Adenovirus F40/41 NOT DETECTED NOT DETECTED Final   Astrovirus NOT DETECTED NOT DETECTED Final   Norovirus GI/GII DETECTED (A) NOT DETECTED Final    Comment: CRITICAL RESULT CALLED TO, READ BACK BY AND VERIFIED WITH: ERIC JUNG 02/13/23 @ 1126 BY SB    Rotavirus A NOT DETECTED NOT DETECTED Final   Sapovirus (I, II, IV, and V) NOT DETECTED NOT DETECTED Final    Comment: Performed at [facility], [address], [place], [place] [address]    Procedures/Studies: CT ABDOMEN PELVIS W CONTRAST  Result Date:  02/11/2023 CLINICAL DATA:  Acute left-sided abdominal pain. EXAM: CT ABDOMEN AND PELVIS WITH CONTRAST TECHNIQUE: Multidetector CT imaging of the abdomen and pelvis was performed using the standard protocol following bolus administration of intravenous contrast. RADIATION DOSE REDUCTION: This exam was performed according to the departmental dose-optimization program which includes automated exposure control, adjustment of the mA and/or kV according to patient size and/or use of iterative reconstruction technique. CONTRAST:  53mL OMNIPAQUE IOHEXOL 350 MG/ML SOLN COMPARISON:  January 03, 2023. FINDINGS: Lower chest: No acute abnormality. Hepatobiliary: No focal liver abnormality is seen. No gallstones, gallbladder wall thickening, or biliary dilatation. Pancreas: Unremarkable. No pancreatic ductal dilatation or surrounding inflammatory changes. Spleen: Mild splenomegaly. Adrenals/Urinary Tract: Adrenal glands are unremarkable. Kidneys are normal, without renal calculi, focal lesion, or hydronephrosis. Bladder is unremarkable. Stomach/Bowel: Stomach is within normal limits. Appendix appears normal. No evidence of bowel wall thickening, distention, or inflammatory changes. Vascular/Lymphatic: No significant vascular findings are present. No enlarged abdominal or pelvic lymph nodes. Reproductive: Prostate is unremarkable. Other: No abdominal wall hernia or abnormality. No abdominopelvic ascites. Musculoskeletal: No acute or significant osseous findings. IMPRESSION: Mild splenomegaly. No definite acute abnormality seen in the abdomen or pelvis. Electronically Signed   By: Marijo Conception M.D.   On: 02/11/2023 16:11   US Abdomen Limited RUQ (LIVER/GB)  Result Date: 01/30/2023 CLINICAL DATA:  Nausea, vomiting, and abdominal pain for several months EXAM: ULTRASOUND ABDOMEN LIMITED RIGHT UPPER QUADRANT COMPARISON:  None Available. FINDINGS: Gallbladder: No gallstones or wall thickening visualized. No sonographic Murphy  sign noted by sonographer. Common bile duct: Diameter: 3 mm Liver: Diffuse increased echogenicity throughout the liver. No liver mass identified. Portal vein is patent on color Doppler imaging with normal direction of blood flow towards the liver. Other: None. IMPRESSION: 1. Diffuse increased echogenicity throughout the liver is nonspecific and may be seen with hepatic steatosis or other underlying intrinsic liver disease. 2. No other abnormalities. Electronically Signed   By: Dorise Bullion III M.D.   On: 01/30/2023 08:50    Labs: BNP (last 3 results) No results for input(s): "BNP" in the last 8760 hours. Basic Metabolic Panel: Recent Labs  Lab 02/11/23 1323 02/12/23 0128  02/13/23 0320 02/14/23 0158  NA 136 133* 136 137  K 3.5 3.4* 4.1 4.0  CL 99 102 103 104  CO2 19* 24 28 29   GLUCOSE 140* 101* 88 97  BUN 9 8 <5* <5*  CREATININE 0.95 0.77 0.69 0.76  CALCIUM 9.2 8.1* 8.5* 8.5*  MG  --  1.7  --   --    Liver Function Tests: Recent Labs  Lab 02/11/23 1323  AST 41  ALT 44  ALKPHOS 79  BILITOT 1.2  PROT 7.5  ALBUMIN 4.5   Recent Labs  Lab 02/11/23 1323  LIPASE 28   No results for input(s): "AMMONIA" in the last 168 hours. CBC: Recent Labs  Lab 02/11/23 1323 02/12/23 0128 02/13/23 0320 02/14/23 0158  WBC 11.5* 6.2 3.9* 4.0  NEUTROABS 10.4*  --   --   --   HGB 16.4 13.2 13.3 12.5*  HCT 49.2 39.6 38.3* 37.1*  MCV 83.5 82.8 81.5 83.2  PLT 123* 84* 87* 86*   Cardiac Enzymes: No results for input(s): "CKTOTAL", "CKMB", "CKMBINDEX", "TROPONINI" in the last 168 hours. BNP: Invalid input(s): "POCBNP" CBG: No results for input(s): "GLUCAP" in the last 168 hours. D-Dimer No results for input(s): "DDIMER" in the last 72 hours. Hgb A1c No results for input(s): "HGBA1C" in the last 72 hours. Lipid Profile No results for input(s): "CHOL", "HDL", "LDLCALC", "TRIG", "CHOLHDL", "LDLDIRECT" in the last 72 hours. Thyroid function studies No results for input(s): "TSH",  "T4TOTAL", "T3FREE", "THYROIDAB" in the last 72 hours.  Invalid input(s): "FREET3" Anemia work up Recent Labs    02/11/23 1323  VITAMINB12 390   Urinalysis    Component Value Date/Time   COLORURINE YELLOW 02/11/2023 [place] 02/11/2023 2313   LABSPEC 1.039 (H) 02/11/2023 2313   PHURINE 5.0 02/11/2023 2313   GLUCOSEU NEGATIVE 02/11/2023 2313   GLUCOSEU NEGATIVE 01/12/2023 1414   HGBUR NEGATIVE 02/11/2023 2313   BILIRUBINUR NEGATIVE 02/11/2023 2313   KETONESUR NEGATIVE 02/11/2023 2313   PROTEINUR NEGATIVE 02/11/2023 2313   UROBILINOGEN 0.2 01/12/2023 1414   NITRITE NEGATIVE 02/11/2023 2313   LEUKOCYTESUR NEGATIVE 02/11/2023 2313   Sepsis Labs Recent Labs  Lab 02/11/23 1323 02/12/23 0128 02/13/23 0320 02/14/23 0158  WBC 11.5* 6.2 3.9* 4.0   Microbiology Recent Results (from the past 240 hour(s))  C Difficile Quick Screen w PCR reflex     Status: None   Collection Time: 02/12/23  9:41 AM   Specimen: STOOL  Result Value Ref Range Status   C Diff antigen NEGATIVE NEGATIVE Final   C Diff toxin NEGATIVE NEGATIVE Final   C Diff interpretation No C. difficile detected.  Final    Comment: Performed at [facility], [place] [address] [place], [place] [address]  Gastrointestinal Panel by PCR , Stool     Status: Abnormal   Collection Time: 02/12/23 11:26 AM   Specimen: STOOL  Result Value Ref Range Status   Campylobacter species NOT DETECTED NOT DETECTED Final   Plesimonas shigelloides NOT DETECTED NOT DETECTED Final   Salmonella species NOT DETECTED NOT DETECTED Final   Yersinia enterocolitica NOT DETECTED NOT DETECTED Final   Vibrio species NOT DETECTED NOT DETECTED Final   Vibrio cholerae NOT DETECTED NOT DETECTED Final   Enteroaggregative E coli (EAEC) NOT DETECTED NOT DETECTED Final   Enteropathogenic E coli (EPEC) NOT DETECTED NOT DETECTED Final   Enterotoxigenic E coli (ETEC) NOT DETECTED NOT DETECTED Final   Shiga like toxin producing E coli  (STEC)  NOT DETECTED NOT DETECTED Final   Shigella/Enteroinvasive E coli (EIEC) NOT DETECTED NOT DETECTED Final   Cryptosporidium NOT DETECTED NOT DETECTED Final   Cyclospora cayetanensis NOT DETECTED NOT DETECTED Final   Entamoeba histolytica NOT DETECTED NOT DETECTED Final   Giardia lamblia NOT DETECTED NOT DETECTED Final   Adenovirus F40/41 NOT DETECTED NOT DETECTED Final   Astrovirus NOT DETECTED NOT DETECTED Final   Norovirus GI/GII DETECTED (A) NOT DETECTED Final    Comment: CRITICAL RESULT CALLED TO, READ BACK BY AND VERIFIED WITH: ERIC JUNG 02/13/23 @ 1126 BY SB    Rotavirus A NOT DETECTED NOT DETECTED Final   Sapovirus (I, II, IV, and V) NOT DETECTED NOT DETECTED Final    Comment: Performed at [facility], [address] [place], [place] [address]   Time coordinating discharge: 25 minutes  SIGNED: Antonieta Pert, MD  [facility] 02/14/2023, 10:23 AM  If 7PM-7AM, please contact night-coverage [email or web address]

## 2023-02-14 NOTE — Plan of Care (Signed)

## 2023-02-16 ENCOUNTER — Telehealth: Payer: Self-pay

## 2023-02-16 NOTE — Transitions of Care (Post Inpatient/ED Visit) (Signed)
   02/16/2023  Name: Alan Sanders MRN: [id number] DOB: 09/12/1971  Today's TOC FU Call Status: Today's TOC FU Call Status:: Unsuccessul Call (1st Attempt)  Attempted to reach the patient regarding the most recent Inpatient/ED visit.  Follow Up Plan: Additional outreach attempts will be made to reach the patient to complete the Transitions of Care (Post Inpatient/ED visit) call.   Signature  Glenna Durand LPN [facility] Team Direct dial:  [phone]

## 2023-02-17 ENCOUNTER — Other Ambulatory Visit (HOSPITAL_COMMUNITY): Payer: Self-pay

## 2023-02-17 ENCOUNTER — Ambulatory Visit: Payer: Commercial Managed Care - PPO | Admitting: Family Medicine

## 2023-02-17 ENCOUNTER — Encounter: Payer: Self-pay | Admitting: Family Medicine

## 2023-02-17 ENCOUNTER — Other Ambulatory Visit: Payer: Self-pay

## 2023-02-17 VITALS — BP 124/74 | HR 71 | Temp 97.6°F | Ht 74.0 in | Wt 210.0 lb

## 2023-02-17 DIAGNOSIS — R1013 Epigastric pain: Secondary | ICD-10-CM

## 2023-02-17 DIAGNOSIS — F5105 Insomnia due to other mental disorder: Secondary | ICD-10-CM | POA: Diagnosis not present

## 2023-02-17 DIAGNOSIS — Z720 Tobacco use: Secondary | ICD-10-CM | POA: Diagnosis not present

## 2023-02-17 DIAGNOSIS — D696 Thrombocytopenia, unspecified: Secondary | ICD-10-CM | POA: Diagnosis not present

## 2023-02-17 DIAGNOSIS — Z860100 Personal history of colon polyps, unspecified: Secondary | ICD-10-CM

## 2023-02-17 DIAGNOSIS — F418 Other specified anxiety disorders: Secondary | ICD-10-CM

## 2023-02-17 MED ORDER — ESCITALOPRAM OXALATE 10 MG PO TABS
ORAL_TABLET | ORAL | 0 refills | Status: DC
  Filled 2023-02-17: qty 77, 42d supply, fill #0

## 2023-02-17 NOTE — Progress Notes (Signed)
Established Patient Office Visit   Subjective:  Patient ID: Alan Sanders, male    DOB: 04-10-1971  Age: 52 y.o. MRN: [id number]  Chief Complaint  Patient presents with   Hospitalization [facility] follow up on nora virus. Per pt have improved but still little stomach pains. Needing a note to return to work tomorrow.     HPI Encounter Diagnoses  Name Primary?   Insomnia secondary to depression with anxiety Yes   Dyspepsia    Tobacco abuse    Thrombocytopenia (HCC)    History of colonic polyps    For follow-up of recent hospitalization for dehydration with a norovirus infection.  He has recovered and is feeling better.  Ongoing history of dyspepsia with mid epigastric pain, decreased appetite, indigestion and weight loss.  He is scheduled for EGD and colonoscopy on Monday.  Self discontinued his Paxil several weeks ago.  Symptoms of depression have returned.  He has not been feeling well and this is compounded his depression.  He has never been in for a physical.  He is accompanied by his wife today.   Review of Systems  Constitutional: Negative.   HENT: Negative.    Eyes:  Negative for blurred vision, discharge and redness.  Respiratory: Negative.    Cardiovascular: Negative.   Gastrointestinal:  Positive for heartburn. Negative for abdominal pain.  Genitourinary: Negative.   Musculoskeletal: Negative.  Negative for myalgias.  Skin:  Negative for rash.  Neurological:  Negative for tingling, loss of consciousness and weakness.  Endo/Heme/Allergies:  Negative for polydipsia.     Current Outpatient Medications:    albuterol (VENTOLIN HFA) 108 (90 Base) MCG/ACT inhaler, Inhale 1-2 puffs into the lungs every 6 (six) hours as needed for wheezing or shortness of breath. (Patient taking differently: Inhale 2-3 puffs into the lungs as needed for wheezing or shortness of breath.), Disp: 6.7 g, Rfl: 2   escitalopram (LEXAPRO) 10 MG tablet, Take 1 tablet (10 mg total) by  mouth daily for 7 days, THEN 2 tablets (20 mg total) daily., Disp: 77 tablet, Rfl: 0   gabapentin (NEURONTIN) 300 MG capsule, Take 1 capsule (300 mg total) by mouth 3 (three) times daily. (Patient taking differently: Take 300-900 mg by mouth See admin instructions. Take 300 mg by mouth in the afternoon and 900 mg by mouth at bedtime.), Disp: 90 capsule, Rfl: 0   methocarbamol (ROBAXIN) 750 MG tablet, Take 1 tablet (750 mg total) by mouth 3 (three) times daily as needed. (Patient taking differently: Take 750-1,500 mg by mouth See admin instructions. Take 750 mg by mouth in the morning and 1500 mg by mouth at bedtime.), Disp: 90 tablet, Rfl: 2   ondansetron (ZOFRAN) 4 MG tablet, Take 1 tablet (4 mg total) by mouth every 8 (eight) hours as needed for up to 7 days for nausea or vomiting., Disp: 12 tablet, Rfl: 0   pantoprazole (PROTONIX) 40 MG tablet, Take 1 tablet (40 mg total) by mouth daily., Disp: 30 tablet, Rfl: 0   acetaminophen (TYLENOL) 325 MG tablet, Take 650 mg by mouth as needed for headache. (Patient not taking: Reported on 02/17/2023), Disp: , Rfl:    QUEtiapine (SEROQUEL) 25 MG tablet, TAKE 1 TABLET BY MOUTH EVERYDAY AT BEDTIME (Patient not taking: Reported on 12/30/2022), Disp: 90 tablet, Rfl: 1   Objective:     BP 124/74 (BP Location: Right Arm, Patient Position: Sitting, Cuff Size: Normal)   Pulse 71   Temp 97.6 F (36.4 C) (  Temporal)   Ht 6\' 2"  (1.88 m)   Wt 210 lb (95.3 kg)   SpO2 98%   BMI 26.96 kg/m  Wt Readings from Last 3 Encounters:  02/17/23 210 lb (95.3 kg)  02/13/23 198 lb 3.1 oz (89.9 kg)  01/27/23 206 lb (93.4 kg)      Physical Exam Constitutional:      General: He is not in acute distress.    Appearance: Normal appearance. He is not ill-appearing, toxic-appearing or diaphoretic.  HENT:     Head: Normocephalic and atraumatic.     Right Ear: External ear normal.     Left Ear: External ear normal.  Eyes:     General: No scleral icterus.       Right eye: No  discharge.        Left eye: No discharge.     Extraocular Movements: Extraocular movements intact.     Conjunctiva/sclera: Conjunctivae normal.  Cardiovascular:     Rate and Rhythm: Normal rate and regular rhythm.  Pulmonary:     Effort: Pulmonary effort is normal. No respiratory distress.     Breath sounds: Normal breath sounds.  Abdominal:     General: Bowel sounds are normal.  Skin:    General: Skin is warm and dry.  Neurological:     Mental Status: He is alert and oriented to person, place, and time.  Psychiatric:        Mood and Affect: Mood normal.        Behavior: Behavior normal.      No results found for any visits on 02/17/23.   The 10-year ASCVD risk score (Arnett DK, et al., 2019) is: 10.1%    Assessment & Plan:   Insomnia secondary to depression with anxiety -     Escitalopram Oxalate; Take 1 tablet (10 mg total) by mouth daily for 7 days, THEN 2 tablets (20 mg total) daily.  Dispense: 77 tablet; Refill: 0  Dyspepsia  Tobacco abuse  Thrombocytopenia (HCC)  History of colonic polyps    Return in about 6 weeks (around 03/31/2023), or Return fasting in 6 weeks for physical exam and follow-up..  Will switch to Lexapro.  10 mg for 1 week and then 20 mg thereafter.  Colonoscopy/EGD on Monday.  Will return in 6 weeks for physical.  Will also need to address above issues.   Libby Maw, MD

## 2023-02-18 ENCOUNTER — Other Ambulatory Visit: Payer: Self-pay

## 2023-02-23 ENCOUNTER — Ambulatory Visit (AMBULATORY_SURGERY_CENTER): Payer: Commercial Managed Care - PPO | Admitting: Gastroenterology

## 2023-02-23 ENCOUNTER — Encounter: Payer: Self-pay | Admitting: Gastroenterology

## 2023-02-23 VITALS — BP 117/67 | HR 65 | Temp 96.4°F | Resp 9 | Ht 73.0 in | Wt 206.0 lb

## 2023-02-23 DIAGNOSIS — Z09 Encounter for follow-up examination after completed treatment for conditions other than malignant neoplasm: Secondary | ICD-10-CM | POA: Diagnosis not present

## 2023-02-23 DIAGNOSIS — D123 Benign neoplasm of transverse colon: Secondary | ICD-10-CM | POA: Diagnosis not present

## 2023-02-23 DIAGNOSIS — J45909 Unspecified asthma, uncomplicated: Secondary | ICD-10-CM | POA: Diagnosis not present

## 2023-02-23 DIAGNOSIS — R112 Nausea with vomiting, unspecified: Secondary | ICD-10-CM | POA: Diagnosis not present

## 2023-02-23 DIAGNOSIS — Z86010 Personal history of colonic polyps: Secondary | ICD-10-CM | POA: Diagnosis not present

## 2023-02-23 DIAGNOSIS — K2270 Barrett's esophagus without dysplasia: Secondary | ICD-10-CM | POA: Diagnosis not present

## 2023-02-23 DIAGNOSIS — R197 Diarrhea, unspecified: Secondary | ICD-10-CM | POA: Diagnosis not present

## 2023-02-23 DIAGNOSIS — K319 Disease of stomach and duodenum, unspecified: Secondary | ICD-10-CM | POA: Diagnosis not present

## 2023-02-23 DIAGNOSIS — D122 Benign neoplasm of ascending colon: Secondary | ICD-10-CM

## 2023-02-23 DIAGNOSIS — F32A Depression, unspecified: Secondary | ICD-10-CM | POA: Diagnosis not present

## 2023-02-23 DIAGNOSIS — F419 Anxiety disorder, unspecified: Secondary | ICD-10-CM | POA: Diagnosis not present

## 2023-02-23 MED ORDER — SODIUM CHLORIDE 0.9 % IV SOLN: 500.0000 mL | INTRAVENOUS | Status: DC

## 2023-02-23 MED ORDER — PANTOPRAZOLE SODIUM 40 MG PO TBEC: 40.0000 mg | DELAYED_RELEASE_TABLET | Freq: Every day | ORAL | 0 refills | Status: DC

## 2023-02-23 NOTE — Progress Notes (Unsigned)
Sedate, gd SR, tolerated procedure well, VSS, report to RN 

## 2023-02-23 NOTE — Progress Notes (Unsigned)
[facility] History and Physical   Primary Care Physician:  Libby Maw, MD   Reason for Procedure:   Nausea, vomiting, diarrhea, polyp surveillance  Plan:    EGD, colonoscopy     HPI: Alan Sanders is a 52 y.o. male undergoing EGD and colonoscopy to evaluate chronic abdominal pain, nausea, vomiting, diarrhea and weight loss of several months duration.  He underwent a screening colonoscopy in May 2023 and had >10 tubular adenomas including a 68mm TA in the proximal transverse colon and a 15 mm TVA in the sigmoid colon.  He was recommended to repeat colonoscopy in 6 months.  Past Medical History:  Diagnosis Date   Anxiety    situational anxiety-on meds   Arthritis    neck/back/LEFT wrist   Asthma    PRN inhaler   Balance problem    Depression    on meds   Family history of colonic polyps 05/08/2022   Gait difficulty    GERD (gastroesophageal reflux disease)    OTC PRN meds   Headache    migraines   History of colonic polyps 05/08/2022   Inguinal hernia of left side without obstruction or gangrene    Pneumonia    Seasonal allergies    Seizures    " its been along time ,since my last seizure "   Spleen enlarged    Thrombocytopenia    Tobacco abuse     Past Surgical History:  Procedure Laterality Date   ANTERIOR CERVICAL DECOMP/DISCECTOMY FUSION N/A 04/16/2021   Procedure: Cervical three-four  Anterior cervical decompression/discectomy/fusion;  Surgeon: Karsten Ro, DO;  Location: [facility];  Service: Neurosurgery;  Laterality: N/A;   COLONOSCOPY  03/2022   [place]-suprep(adeq)-many TA's-1 piecemeal   INGUINAL HERNIA REPAIR Right 2006   POLYPECTOMY  03/2022   many TA's-1 piecemeal   SURAL NERVE BX Right 12/11/2020   Procedure: SURAL NERVE BIOPSY;  Surgeon: Karsten Ro, DO;  Location: [facility];  Service: Neurosurgery;  Laterality: Right;    Prior to Admission medications   Medication Sig Start Date End Date Taking? Authorizing Provider   escitalopram (LEXAPRO) 10 MG tablet Take 1 tablet (10 mg total) by mouth daily for 7 days, THEN 2 tablets (20 mg total) daily. 02/17/23 04/01/23 Yes Libby Maw, MD  gabapentin (NEURONTIN) 300 MG capsule Take 1 capsule (300 mg total) by mouth 3 (three) times daily. Patient taking differently: Take 300-900 mg by mouth See admin instructions. Take 300 mg by mouth in the afternoon and 900 mg by mouth at bedtime. 08/26/22  Yes Libby Maw, MD  methocarbamol (ROBAXIN) 750 MG tablet Take 1 tablet (750 mg total) by mouth 3 (three) times daily as needed. Patient taking differently: Take 750-1,500 mg by mouth See admin instructions. Take 750 mg by mouth in the morning and 1500 mg by mouth at bedtime. 10/06/22  Yes   acetaminophen (TYLENOL) 325 MG tablet Take 650 mg by mouth as needed for headache. Patient not taking: Reported on 02/17/2023    [provider]  albuterol (VENTOLIN HFA) 108 (90 Base) MCG/ACT inhaler Inhale 1-2 puffs into the lungs every 6 (six) hours as needed for wheezing or shortness of breath. Patient taking differently: Inhale 2-3 puffs into the lungs as needed for wheezing or shortness of breath. 07/18/22   Haydee Salter, MD  pantoprazole (PROTONIX) 40 MG tablet Take 1 tablet (40 mg total) by mouth daily. Patient not taking: Reported on 02/23/2023 02/14/23 03/16/23  Antonieta Pert, MD  QUEtiapine (SEROQUEL) 25 MG tablet TAKE 1 TABLET BY MOUTH EVERYDAY AT BEDTIME Patient not taking: Reported on 12/30/2022 03/21/22   Libby Maw, MD    Current Outpatient Medications  Medication Sig Dispense Refill   escitalopram (LEXAPRO) 10 MG tablet Take 1 tablet (10 mg total) by mouth daily for 7 days, THEN 2 tablets (20 mg total) daily. 77 tablet 0   gabapentin (NEURONTIN) 300 MG capsule Take 1 capsule (300 mg total) by mouth 3 (three) times daily. (Patient taking differently: Take 300-900 mg by mouth See admin instructions. Take 300 mg by mouth in the afternoon and 900 mg  by mouth at bedtime.) 90 capsule 0   methocarbamol (ROBAXIN) 750 MG tablet Take 1 tablet (750 mg total) by mouth 3 (three) times daily as needed. (Patient taking differently: Take 750-1,500 mg by mouth See admin instructions. Take 750 mg by mouth in the morning and 1500 mg by mouth at bedtime.) 90 tablet 2   acetaminophen (TYLENOL) 325 MG tablet Take 650 mg by mouth as needed for headache. (Patient not taking: Reported on 02/17/2023)     albuterol (VENTOLIN HFA) 108 (90 Base) MCG/ACT inhaler Inhale 1-2 puffs into the lungs every 6 (six) hours as needed for wheezing or shortness of breath. (Patient taking differently: Inhale 2-3 puffs into the lungs as needed for wheezing or shortness of breath.) 6.7 g 2   pantoprazole (PROTONIX) 40 MG tablet Take 1 tablet (40 mg total) by mouth daily. (Patient not taking: Reported on 02/23/2023) 30 tablet 0   QUEtiapine (SEROQUEL) 25 MG tablet TAKE 1 TABLET BY MOUTH EVERYDAY AT BEDTIME (Patient not taking: Reported on 12/30/2022) 90 tablet 1   Current Facility-Administered Medications  Medication Dose Route Frequency Provider Last Rate Last Admin   0.9 %  sodium chloride infusion  500 mL Intravenous Continuous Alan November, MD        Allergies as of 02/23/2023 - Review Complete 02/23/2023  Allergen Reaction Noted   Hydromorphone Itching and Rash 12/18/2021    Family History  Problem Relation Age of Onset   Diabetes Mother    Hypertension Mother    COPD Mother    Colon polyps Father        unknown number   Alcoholism Brother    Colon cancer Neg Hx    Esophageal cancer Neg Hx    Stomach cancer Neg Hx    Rectal cancer Neg Hx     Social History   Socioeconomic History   Marital status: Married    Spouse name: Not on file   Number of children: 2   Years of education: Not on file   Highest education level: High school graduate  Occupational History   Not on file  Tobacco Use   Smoking status: Every Day    Packs/day: 0.50    Years: 24.00     Additional pack years: 0.00    Total pack years: 12.00    Types: Cigarettes   Smokeless tobacco: Current    Types: Snuff   Tobacco comments:    Chewed snuff last night  Vaping Use   Vaping Use: Never used  Substance and Sexual Activity   Alcohol use: No   Drug use: No   Sexual activity: Not on file  Other Topics Concern   Not on file  Social History Narrative   Lives with spouse   Sweet tea, energy drinks, Diet Coke, work 12 hr shifts   Social Determinants of Radio broadcast assistant  Strain: Not on file  Food Insecurity: No Food Insecurity (02/12/2023)   Hunger Vital Sign    Worried About Running Out of Food in the Last Year: Never true    Ran Out of Food in the Last Year: Never true  Transportation Needs: No Transportation Needs (02/12/2023)   PRAPARE - Hydrologist (Medical): No    Lack of Transportation (Non-Medical): No  Physical Activity: Not on file  Stress: Not on file  Social Connections: Not on file  Intimate Partner Violence: Not At Risk (02/12/2023)   Humiliation, Afraid, Rape, and Kick questionnaire    Fear of Current or Ex-Partner: No    Emotionally Abused: No    Physically Abused: No    Sexually Abused: No    Review of Systems:  All other review of systems negative except as mentioned in the HPI.  Physical Exam: Vital signs BP 137/72   Pulse 84   Temp (!) 96.4 F (35.8 C)   Ht 6\' 1"  (1.854 m)   Wt 206 lb (93.4 kg)   SpO2 97%   BMI 27.18 kg/m   General:   Alert,  Well-developed, well-nourished, pleasant and cooperative in NAD Airway:  Mallampati 2 Lungs:  Clear throughout to auscultation.   Heart:  Regular rate and rhythm; no murmurs, clicks, rubs,  or gallops. Abdomen:  Soft, nontender and nondistended. Normal bowel sounds.   Neuro/Psych:  Normal mood and affect. A and O x 3   Iasha Mccalister E. Candis Schatz, MD [facility]

## 2023-02-23 NOTE — Progress Notes (Unsigned)
Called to room to assist during endoscopic procedure.  Patient ID and intended procedure confirmed with present staff. Received instructions for my participation in the procedure from the performing physician.  

## 2023-02-23 NOTE — Op Note (Signed)
[facility] Patient Name: Alan Sanders Procedure Date: 02/23/2023 4:19 PM MRN: [id number] Endoscopist: Nicki Reaper E. Candis Sanders , MD, [id number] Age: 52 Referring MD:  Date of Birth: 05-11-1971 Gender: Male Account #: [id number] Procedure:                Upper GI endoscopy Indications:              Diarrhea, Nausea with vomiting, Weight loss Medicines:                Monitored Anesthesia Care Procedure:                Pre-Anesthesia Assessment:                           - Prior to the procedure, a History and Physical                            was performed, and patient medications and                            allergies were reviewed. The patient's tolerance of                            previous anesthesia was also reviewed. The risks                            and benefits of the procedure and the sedation                            options and risks were discussed with the patient.                            All questions were answered, and informed consent                            was obtained. Prior Anticoagulants: The patient has                            taken no anticoagulant or antiplatelet agents. ASA                            Grade Assessment: II - A patient with mild systemic                            disease. After reviewing the risks and benefits,                            the patient was deemed in satisfactory condition to                            undergo the procedure.                           After obtaining informed consent, the endoscope was  passed under direct vision. Throughout the                            procedure, the patient's blood pressure, pulse, and                            oxygen saturations were monitored continuously. The                            GIF [id number] was introduced through the                            mouth, and advanced to the second part of duodenum.                            The  upper GI endoscopy was accomplished without                            difficulty. The patient tolerated the procedure                            well. Scope In: Scope Out: Findings:                 The examined portions of the nasopharynx,                            oropharynx and larynx were normal.                           There were esophageal mucosal changes suspicious                            for short-segment Barrett's esophagus, classified                            as Barrett's stage C0-M2 per Prague criteria                            present in the lower third of the esophagus. The                            maximum longitudinal extent of these mucosal                            changes was 2 cm in length. Mucosa was biopsied                            with a cold forceps for histology. A total of 2                            specimen bottles were sent to pathology. Estimated                            blood  loss was minimal.                           The exam of the esophagus was otherwise normal.                           A 4 cm hiatal hernia was present.                           The entire examined stomach was normal. Biopsies                            were taken with a cold forceps for Helicobacter                            pylori testing. Estimated blood loss was minimal.                           The examined duodenum was normal. Biopsies for                            histology were taken with a cold forceps for                            evaluation of celiac disease. Estimated blood loss                            was minimal. Complications:            No immediate complications. Estimated Blood Loss:     Estimated blood loss was minimal. Impression:               - The examined portions of the nasopharynx,                            oropharynx and larynx were normal.                           - Esophageal mucosal changes suspicious for                             short-segment Barrett's esophagus, classified as                            Barrett's stage C0-M2 per Prague criteria. Biopsied.                           - Normal stomach. Biopsied.                           - Normal examined duodenum. Biopsied. Recommendation:           - Patient has a contact number available for                            emergencies. The signs and symptoms of potential  delayed complications were discussed with the                            patient. Return to normal activities tomorrow.                            Written discharge instructions were provided to the                            patient.                           - Resume previous diet.                           - Continue present medications.                           - Await pathology results.                           - Use Protonix (pantoprazole) 40 mg PO daily.                           - Repeat upper endoscopy (date not yet determined)                            for surveillance of Barrett's esophagus. Alan Nancarrow E. Candis Schatz, MD 02/23/2023 5:23:13 PM This report has been signed electronically.

## 2023-02-23 NOTE — Patient Instructions (Addendum)
Recommendation:           - Patient has a contact number available for                            emergencies. The signs and symptoms of potential                            delayed complications were discussed with the                            patient. Return to normal activities tomorrow.                            Written discharge instructions were provided to the                            patient.                           - Resume previous diet.                           - Continue present medications.                           - Await pathology results.                           - Repeat colonoscopy in <1 year because the bowel                            preparation was suboptimal and for surveillance of                            numerous colon adenomas.  Recommendation:           - Patient has a contact number available for                            emergencies. The signs and symptoms of potential                            delayed complications were discussed with the                            patient. Return to normal activities tomorrow.                            Written discharge instructions were provided to the                            patient.                           - Resume previous diet.                           -  Continue present medications.                           - Await pathology results.                           - Repeat colonoscopy in <1 year because the bowel                            preparation was suboptimal and for surveillance of                            numerous colon adenomas.  Handout on polyps given.  YOU HAD AN ENDOSCOPIC PROCEDURE TODAY AT [facility]:   Refer to the procedure report that was given to you for any specific questions about what was found during the examination.  If the procedure report does not answer your questions, please call your gastroenterologist to clarify.  If you requested that your care  partner not be given the details of your procedure findings, then the procedure report has been included in a sealed envelope for you to review at your convenience later.  YOU SHOULD EXPECT: Some feelings of bloating in the abdomen. Passage of more gas than usual.  Walking can help get rid of the air that was put into your GI tract during the procedure and reduce the bloating. If you had a lower endoscopy (such as a colonoscopy or flexible sigmoidoscopy) you may notice spotting of blood in your stool or on the toilet paper. If you underwent a bowel prep for your procedure, you may not have a normal bowel movement for a few days.  Please Note:  You might notice some irritation and congestion in your nose or some drainage.  This is from the oxygen used during your procedure.  There is no need for concern and it should clear up in a day or so.  SYMPTOMS TO REPORT IMMEDIATELY:  Following lower endoscopy (colonoscopy or flexible sigmoidoscopy):  Excessive amounts of blood in the stool  Significant tenderness or worsening of abdominal pains  Swelling of the abdomen that is new, acute  Fever of 100F or higher  Following upper endoscopy (EGD)  Vomiting of blood or coffee ground material  New chest pain or pain under the shoulder blades  Painful or persistently difficult swallowing  New shortness of breath  Fever of 100F or higher  Black, tarry-looking stools  For urgent or emergent issues, a gastroenterologist can be reached at any hour by calling [phone]. Do not use MyChart messaging for urgent concerns.    DIET:  We do recommend a small meal at first, but then you may proceed to your regular diet.  Drink plenty of fluids but you should avoid alcoholic beverages for 24 hours.  ACTIVITY:  You should plan to take it easy for the rest of today and you should NOT DRIVE or use heavy machinery until tomorrow (because of the sedation medicines used during the test).    FOLLOW UP: Our staff  will call the number listed on your records the next business day following your procedure.  We will call around 7:15- 8:00 am to check on you and address any questions or concerns that you may have regarding the information given to you following your procedure. If we do not  reach you, we will leave a message.     If any biopsies were taken you will be contacted by phone or by letter within the next 1-3 weeks.  Please call us at [phone] if you have not heard about the biopsies in 3 weeks.    SIGNATURES/CONFIDENTIALITY: You and/or your care partner have signed paperwork which will be entered into your electronic medical record.  These signatures attest to the fact that that the information above on your After Visit Summary has been reviewed and is understood.  Full responsibility of the confidentiality of this discharge information lies with you and/or your care-partner.

## 2023-02-23 NOTE — Op Note (Signed)
[place] Patient Name: Alan Sanders Procedure Date: 02/23/2023 4:18 PM MRN: [id number] Endoscopist: Nicki Reaper E. Candis Schatz , MD, [id number] Age: 52 Referring MD:  Date of Birth: [birth year] Gender: Male Account #: [id number] Procedure:                Colonoscopy Indications:              Surveillance: Personal history of piecemeal removal                            of adenoma on last colonoscopy (less than 1 year                            ago), Incidental diarrhea noted Medicines:                Monitored Anesthesia Care Procedure:                Pre-Anesthesia Assessment:                           - Prior to the procedure, a History and Physical                            was performed, and patient medications and                            allergies were reviewed. The patient's tolerance of                            previous anesthesia was also reviewed. The risks                            and benefits of the procedure and the sedation                            options and risks were discussed with the patient.                            All questions were answered, and informed consent                            was obtained. Prior Anticoagulants: The patient has                            taken no anticoagulant or antiplatelet agents. ASA                            Grade Assessment: II - A patient with mild systemic                            disease. After reviewing the risks and benefits,                            the patient was deemed in satisfactory condition to  undergo the procedure.                           After obtaining informed consent, the colonoscope                            was passed under direct vision. Throughout the                            procedure, the patient's blood pressure, pulse, and                            oxygen saturations were monitored continuously. The                            CF [id number] was  introduced through the anus                            and advanced to the the terminal ileum, with                            identification of the appendiceal orifice and IC                            valve. The colonoscopy was performed with moderate                            difficulty due to inadequate bowel prep, poor                            endoscopic visualization secondary to excessive                            colon spasm and significant respiratory movement as                            well as significant looping. Successful completion                            of the procedure was aided by using manual                            pressure. The patient tolerated the procedure well.                            The quality of the bowel preparation was                            inadequate. The terminal ileum, ileocecal valve,                            appendiceal orifice, and rectum were photographed.  The bowel preparation used was SUPREP via split                            dose instruction. Scope In: 4:44:58 PM Scope Out: 5:16:03 PM Scope Withdrawal Time: 0 hours 22 minutes 57 seconds  Total Procedure Duration: 0 hours 31 minutes 5 seconds  Findings:                 The perianal and digital rectal examinations were                            normal. Pertinent negatives include normal                            sphincter tone and no palpable rectal lesions.                           A 5 mm polyp was found in the ascending colon. The                            polyp was sessile. The polyp was removed with a                            cold snare. Resection and retrieval were complete.                            Estimated blood loss was minimal.                           A 10 mm polyp was found in the ascending colon. The                            polyp was sessile. The polyp was removed with a                            cold snare. Resection and  retrieval were complete.                            Estimated blood loss was minimal.                           Two sessile polyps were found in the splenic                            flexure. The polyps were 4 to 5 mm in size. These                            polyps were removed with a cold snare. Resection                            and retrieval were complete. Estimated blood loss  was minimal.                           A tattoo was seen in the distal transverse colon.                            The tattoo site appeared normal.                           Normal mucosa was found in the entire colon.                            Biopsies for histology were taken with a cold                            forceps from the ascending colon, transverse colon,                            descending colon and sigmoid colon for evaluation                            of microscopic colitis. Estimated blood loss was                            minimal.                           The exam was otherwise normal throughout the                            examined colon.                           The terminal ileum appeared normal.                           The retroflexed view of the distal rectum and anal                            verge was normal and showed no anal or rectal                            abnormalities. Complications:            No immediate complications. Estimated Blood Loss:     Estimated blood loss was minimal. Impression:               - Preparation of the colon was inadequate.                           - One 5 mm polyp in the ascending colon, removed                            with a cold snare. Resected and retrieved.                           -  One 10 mm polyp in the ascending colon, removed                            with a cold snare. Resected and retrieved.                           - Two 4 to 5 mm polyps at the splenic flexure,                             removed with a cold snare. Resected and retrieved.                           - A tattoo was seen in the distal transverse colon.                            The tattoo site appeared normal.                           - Normal mucosa in the entire examined colon.                            Biopsied.                           - The examined portion of the ileum was normal.                           - The distal rectum and anal verge are normal on                            retroflexion view.                           - The GI Genius (intelligent endoscopy module),                            computer-aided polyp detection system powered by AI                            was utilized to detect colorectal polyps through                            enhanced visualization during colonoscopy. Recommendation:           - Patient has a contact number available for                            emergencies. The signs and symptoms of potential                            delayed complications were discussed with the                            patient. Return to normal activities tomorrow.  Written discharge instructions were provided to the                            patient.                           - Resume previous diet.                           - Continue present medications.                           - Await pathology results.                           - Repeat colonoscopy in <1 year because the bowel                            preparation was suboptimal and for surveillance of                            numerous colon adenomas. Antinio Sanderfer E. Candis Schatz, MD 02/23/2023 5:31:37 PM This report has been signed electronically.

## 2023-02-23 NOTE — Progress Notes (Unsigned)
Discussed this weekends falls with the pt, he reports unlikely any LOC and that he frequently loses his balance and falls.  Also reports having a number of w/u's for this condition the last time in 7/23.  He has severe n/v with abdominal pain and would like to continue with today's exam  Discussed with Dr. Candis Schatz and we will proceed

## 2023-02-23 NOTE — Progress Notes (Signed)
Patient states he fell twice on Saturday, 2 days ago.  States he did hit his head after the 2nd fall and loss consciousness for "a little while".  He did not get evaluated.  He denies any s/s of dizziness, visual disturbances, H/A etc at this time.  He has a hx of seizures, states "this was not a seizure and I just loss my balance".   Dr. Candis Schatz and [place] notified.  Both state to proceed with case if he is asymptomatic today. SChaplin, RN,BSN

## 2023-02-24 ENCOUNTER — Telehealth: Payer: Self-pay

## 2023-02-24 NOTE — Telephone Encounter (Signed)
Attempted follow up call; no answer; left VM. 

## 2023-02-25 ENCOUNTER — Other Ambulatory Visit (HOSPITAL_COMMUNITY): Payer: Self-pay

## 2023-02-26 ENCOUNTER — Other Ambulatory Visit (HOSPITAL_COMMUNITY): Payer: Self-pay

## 2023-02-26 ENCOUNTER — Other Ambulatory Visit: Payer: Self-pay

## 2023-02-26 MED ORDER — METHOCARBAMOL 750 MG PO TABS
750.0000 mg | ORAL_TABLET | Freq: Three times a day (TID) | ORAL | 2 refills | Status: DC | PRN
  Filled 2023-02-26: qty 90, 30d supply, fill #0
  Filled 2023-04-07 – 2023-04-08 (×2): qty 90, 30d supply, fill #1
  Filled 2023-05-14: qty 90, 30d supply, fill #2

## 2023-03-01 NOTE — Progress Notes (Signed)
Mr. Teater,  The biopsies of your esophagus that I took during your recent procedure showed Barrett's mucosa (intestinal metaplasia), but NO sign of the pre-cancerous change called "dysplasia."   Therefore, unless new symptoms arise you will not need another upper endoscopy for 3 years.  You should continue to take a proton pump inhibitor such as pantoprazole daily.  The biopsies taken from your stomach were notable for mild reactive gastropathy which is a common finding and often related to use of certain medications (usually NSAIDs), but there was no evidence of Helicobacter pylori infection. This common finding is not felt to necessarily be a cause of any particular symptom and there is no specific treatment or further evaluation recommended.  The biopsies of your duodenum were unremarkable.  There was no evidence of celiac disease.  The biopsies taken from your colon were normal and there was no evidence of Microscopic Colitis or chronic inflammatory changes.   The polyps that I removed during your recent procedure were completely benign but was proven to be a "pre-cancerous" polyps that MAY have grown into cancers if they had not been removed.  Studies shows that at least 20% of women over age 7 and 30% of men over age 63 have pre-cancerous polyps.  Because of the inadequate bowel prep and your history of numerous adenomas including an adenoma > 2 cm I recommend that you have a repeat colonoscopy in 1 year.   As discussed, given the large number of precancerous polyps found in you, it is recommended that you consider genetic testing to evaluate for mutations associated with polyposis syndromes that increase the risk of colon cancer.   I recommend your children start colon cancer screening at age 70, potentially sooner if a genetic condition is identified.  Alan Sanders,  Can you please place a genetics consultation for possible polyposis syndrome?

## 2023-03-02 ENCOUNTER — Other Ambulatory Visit: Payer: Self-pay

## 2023-03-02 DIAGNOSIS — K635 Polyp of colon: Secondary | ICD-10-CM

## 2023-03-03 ENCOUNTER — Encounter (HOSPITAL_COMMUNITY): Payer: Self-pay

## 2023-03-03 ENCOUNTER — Other Ambulatory Visit (HOSPITAL_COMMUNITY): Payer: Self-pay

## 2023-03-06 DIAGNOSIS — H1032 Unspecified acute conjunctivitis, left eye: Secondary | ICD-10-CM | POA: Diagnosis not present

## 2023-03-06 DIAGNOSIS — H01004 Unspecified blepharitis left upper eyelid: Secondary | ICD-10-CM | POA: Diagnosis not present

## 2023-03-09 ENCOUNTER — Encounter: Payer: Self-pay | Admitting: *Deleted

## 2023-03-09 ENCOUNTER — Other Ambulatory Visit: Payer: Self-pay

## 2023-03-19 ENCOUNTER — Other Ambulatory Visit (HOSPITAL_COMMUNITY): Payer: Self-pay

## 2023-03-22 ENCOUNTER — Other Ambulatory Visit: Payer: Self-pay

## 2023-03-22 ENCOUNTER — Emergency Department (HOSPITAL_COMMUNITY): Payer: Commercial Managed Care - PPO

## 2023-03-22 ENCOUNTER — Inpatient Hospital Stay (HOSPITAL_COMMUNITY)
Admission: EM | Admit: 2023-03-22 | Discharge: 2023-03-26 | DRG: 493 | Disposition: A | Discharge status: Home Health | Payer: Commercial Managed Care - PPO | Attending: Orthopedic Surgery | Admitting: Orthopedic Surgery

## 2023-03-22 ENCOUNTER — Encounter (HOSPITAL_COMMUNITY): Payer: Self-pay

## 2023-03-22 DIAGNOSIS — R9431 Abnormal electrocardiogram [ECG] [EKG]: Secondary | ICD-10-CM | POA: Diagnosis not present

## 2023-03-22 DIAGNOSIS — S82831A Other fracture of upper and lower end of right fibula, initial encounter for closed fracture: Secondary | ICD-10-CM | POA: Diagnosis not present

## 2023-03-22 DIAGNOSIS — W010XXA Fall on same level from slipping, tripping and stumbling without subsequent striking against object, initial encounter: Secondary | ICD-10-CM | POA: Diagnosis present

## 2023-03-22 DIAGNOSIS — S82401A Unspecified fracture of shaft of right fibula, initial encounter for closed fracture: Secondary | ICD-10-CM | POA: Diagnosis not present

## 2023-03-22 DIAGNOSIS — E291 Testicular hypofunction: Secondary | ICD-10-CM | POA: Diagnosis present

## 2023-03-22 DIAGNOSIS — S82201A Unspecified fracture of shaft of right tibia, initial encounter for closed fracture: Principal | ICD-10-CM | POA: Diagnosis present

## 2023-03-22 DIAGNOSIS — M898X9 Other specified disorders of bone, unspecified site: Secondary | ICD-10-CM | POA: Diagnosis present

## 2023-03-22 DIAGNOSIS — Z833 Family history of diabetes mellitus: Secondary | ICD-10-CM

## 2023-03-22 DIAGNOSIS — J45909 Unspecified asthma, uncomplicated: Secondary | ICD-10-CM | POA: Diagnosis not present

## 2023-03-22 DIAGNOSIS — Z79899 Other long term (current) drug therapy: Secondary | ICD-10-CM

## 2023-03-22 DIAGNOSIS — S80919A Unspecified superficial injury of unspecified knee, initial encounter: Secondary | ICD-10-CM | POA: Diagnosis not present

## 2023-03-22 DIAGNOSIS — Z825 Family history of asthma and other chronic lower respiratory diseases: Secondary | ICD-10-CM

## 2023-03-22 DIAGNOSIS — G629 Polyneuropathy, unspecified: Secondary | ICD-10-CM | POA: Diagnosis present

## 2023-03-22 DIAGNOSIS — F17210 Nicotine dependence, cigarettes, uncomplicated: Secondary | ICD-10-CM | POA: Diagnosis not present

## 2023-03-22 DIAGNOSIS — D62 Acute posthemorrhagic anemia: Secondary | ICD-10-CM | POA: Diagnosis not present

## 2023-03-22 DIAGNOSIS — F17200 Nicotine dependence, unspecified, uncomplicated: Secondary | ICD-10-CM | POA: Diagnosis present

## 2023-03-22 DIAGNOSIS — Y92009 Unspecified place in unspecified non-institutional (private) residence as the place of occurrence of the external cause: Secondary | ICD-10-CM

## 2023-03-22 DIAGNOSIS — Y9301 Activity, walking, marching and hiking: Secondary | ICD-10-CM | POA: Diagnosis present

## 2023-03-22 DIAGNOSIS — Z83719 Family history of colon polyps, unspecified: Secondary | ICD-10-CM

## 2023-03-22 DIAGNOSIS — Z811 Family history of alcohol abuse and dependence: Secondary | ICD-10-CM | POA: Diagnosis not present

## 2023-03-22 DIAGNOSIS — Z981 Arthrodesis status: Secondary | ICD-10-CM | POA: Diagnosis not present

## 2023-03-22 DIAGNOSIS — Z885 Allergy status to narcotic agent status: Secondary | ICD-10-CM

## 2023-03-22 DIAGNOSIS — Z8249 Family history of ischemic heart disease and other diseases of the circulatory system: Secondary | ICD-10-CM

## 2023-03-22 DIAGNOSIS — K219 Gastro-esophageal reflux disease without esophagitis: Secondary | ICD-10-CM | POA: Diagnosis not present

## 2023-03-22 DIAGNOSIS — E559 Vitamin D deficiency, unspecified: Secondary | ICD-10-CM | POA: Diagnosis present

## 2023-03-22 DIAGNOSIS — Z043 Encounter for examination and observation following other accident: Secondary | ICD-10-CM | POA: Diagnosis not present

## 2023-03-22 DIAGNOSIS — M25561 Pain in right knee: Secondary | ICD-10-CM | POA: Diagnosis not present

## 2023-03-22 DIAGNOSIS — F32A Depression, unspecified: Secondary | ICD-10-CM | POA: Diagnosis not present

## 2023-03-22 DIAGNOSIS — I10 Essential (primary) hypertension: Secondary | ICD-10-CM | POA: Diagnosis not present

## 2023-03-22 DIAGNOSIS — F17209 Nicotine dependence, unspecified, with unspecified nicotine-induced disorders: Secondary | ICD-10-CM | POA: Diagnosis not present

## 2023-03-22 DIAGNOSIS — S8291XA Unspecified fracture of right lower leg, initial encounter for closed fracture: Principal | ICD-10-CM

## 2023-03-22 DIAGNOSIS — D696 Thrombocytopenia, unspecified: Secondary | ICD-10-CM | POA: Diagnosis not present

## 2023-03-22 DIAGNOSIS — Z720 Tobacco use: Secondary | ICD-10-CM | POA: Diagnosis present

## 2023-03-22 DIAGNOSIS — S8991XA Unspecified injury of right lower leg, initial encounter: Secondary | ICD-10-CM | POA: Diagnosis not present

## 2023-03-22 DIAGNOSIS — F418 Other specified anxiety disorders: Secondary | ICD-10-CM

## 2023-03-22 DIAGNOSIS — W19XXXA Unspecified fall, initial encounter: Secondary | ICD-10-CM | POA: Diagnosis not present

## 2023-03-22 LAB — CBC
HCT: 49.7 % (ref 39.0–52.0)
Hemoglobin: 17.1 g/dL — ABNORMAL HIGH (ref 13.0–17.0)
MCH: 28 pg (ref 26.0–34.0)
MCHC: 34.4 g/dL (ref 30.0–36.0)
MCV: 81.3 fL (ref 80.0–100.0)
Platelets: 146 10*3/uL — ABNORMAL LOW (ref 150–400)
RBC: 6.11 MIL/uL — ABNORMAL HIGH (ref 4.22–5.81)
RDW: 15.1 % (ref 11.5–15.5)
WBC: 8.3 10*3/uL (ref 4.0–10.5)
nRBC: 0 % (ref 0.0–0.2)

## 2023-03-22 LAB — I-STAT CHEM 8, ED
BUN: 9 mg/dL (ref 6–20)
Calcium, Ion: 1.18 mmol/L (ref 1.15–1.40)
Chloride: 100 mmol/L (ref 98–111)
Creatinine, Ser: 0.7 mg/dL (ref 0.61–1.24)
Glucose, Bld: 121 mg/dL — ABNORMAL HIGH (ref 70–99)
HCT: 50 % (ref 39.0–52.0)
Hemoglobin: 17 g/dL (ref 13.0–17.0)
Potassium: 3.9 mmol/L (ref 3.5–5.1)
Sodium: 139 mmol/L (ref 135–145)
TCO2: 26 mmol/L (ref 22–32)

## 2023-03-22 LAB — COMPREHENSIVE METABOLIC PANEL
ALT: 69 U/L — ABNORMAL HIGH (ref 0–44)
AST: 53 U/L — ABNORMAL HIGH (ref 15–41)
Albumin: 4.7 g/dL (ref 3.5–5.0)
Alkaline Phosphatase: 87 U/L (ref 38–126)
Anion gap: 12 (ref 5–15)
BUN: 9 mg/dL (ref 6–20)
CO2: 26 mmol/L (ref 22–32)
Calcium: 9.6 mg/dL (ref 8.9–10.3)
Chloride: 99 mmol/L (ref 98–111)
Creatinine, Ser: 0.77 mg/dL (ref 0.61–1.24)
GFR, Estimated: >60 mL/min (ref >60)
Glucose, Bld: 126 mg/dL — ABNORMAL HIGH (ref 70–99)
Potassium: 3.9 mmol/L (ref 3.5–5.1)
Sodium: 137 mmol/L (ref 135–145)
Total Bilirubin: 1.1 mg/dL (ref 0.3–1.2)
Total Protein: 7.7 g/dL (ref 6.5–8.1)

## 2023-03-22 LAB — PROTIME-INR
INR: 1 (ref 0.8–1.2)
Prothrombin Time: 13 seconds (ref 11.4–15.2)

## 2023-03-22 LAB — ETHANOL: Alcohol, Ethyl (B): <10 mg/dL (ref <10)

## 2023-03-22 LAB — SAMPLE TO BLOOD BANK

## 2023-03-22 LAB — LACTIC ACID, PLASMA: Lactic Acid, Venous: 2.5 mmol/L (ref 0.5–1.9)

## 2023-03-22 MED ORDER — GABAPENTIN 300 MG PO CAPS
300.0000 mg | ORAL_CAPSULE | Freq: Every day | ORAL | Status: DC
  Administered 2023-03-24 – 2023-03-26 (×3): 300 mg via ORAL
  Filled 2023-03-22 (×3): qty 1

## 2023-03-22 MED ORDER — LORAZEPAM 2 MG/ML IJ SOLN
0.5000 mg | Freq: Once | INTRAMUSCULAR | Status: AC
  Administered 2023-03-22: 0.5 mg via INTRAVENOUS
  Filled 2023-03-22: qty 1

## 2023-03-22 MED ORDER — SODIUM CHLORIDE 0.9 % IV BOLUS
1000.0000 mL | Freq: Once | INTRAVENOUS | Status: AC
  Administered 2023-03-22: 1000 mL via INTRAVENOUS

## 2023-03-22 MED ORDER — POLYETHYLENE GLYCOL 3350 17 G PO PACK: 17.0000 g | PACK | Freq: Every day | ORAL | Status: DC | PRN

## 2023-03-22 MED ORDER — MORPHINE SULFATE (PF) 2 MG/ML IV SOLN
INTRAVENOUS | Status: AC
  Filled 2023-03-22: qty 2

## 2023-03-22 MED ORDER — ONDANSETRON HCL 4 MG/2ML IJ SOLN
INTRAMUSCULAR | Status: AC
  Administered 2023-03-22: 4 mg
  Filled 2023-03-22: qty 2

## 2023-03-22 MED ORDER — METHOCARBAMOL 750 MG PO TABS
750.0000 mg | ORAL_TABLET | Freq: Three times a day (TID) | ORAL | Status: DC | PRN
  Administered 2023-03-22 – 2023-03-23 (×2): 750 mg via ORAL
  Filled 2023-03-22 (×2): qty 1

## 2023-03-22 MED ORDER — OXYCODONE HCL 5 MG PO TABS
10.0000 mg | ORAL_TABLET | ORAL | Status: DC | PRN
  Administered 2023-03-22: 15 mg via ORAL
  Filled 2023-03-22: qty 3

## 2023-03-22 MED ORDER — ONDANSETRON HCL 4 MG/2ML IJ SOLN
4.0000 mg | Freq: Four times a day (QID) | INTRAMUSCULAR | Status: DC | PRN
  Administered 2023-03-23: 4 mg via INTRAVENOUS
  Filled 2023-03-22: qty 2

## 2023-03-22 MED ORDER — GABAPENTIN 300 MG PO CAPS
900.0000 mg | ORAL_CAPSULE | Freq: Every day | ORAL | Status: DC
  Administered 2023-03-22 – 2023-03-25 (×4): 900 mg via ORAL
  Filled 2023-03-22 (×4): qty 3

## 2023-03-22 MED ORDER — KETAMINE HCL 10 MG/ML IJ SOLN
INTRAMUSCULAR | Status: AC | PRN
  Administered 2023-03-22: 30 mg via INTRAVENOUS
  Administered 2023-03-22: 50 mg via INTRAVENOUS
  Administered 2023-03-22: 20 mg via INTRAVENOUS

## 2023-03-22 MED ORDER — ACETAMINOPHEN 325 MG PO TABS: 325.0000 mg | ORAL_TABLET | Freq: Four times a day (QID) | ORAL | Status: DC | PRN

## 2023-03-22 MED ORDER — ESCITALOPRAM OXALATE 10 MG PO TABS
20.0000 mg | ORAL_TABLET | Freq: Every day | ORAL | Status: DC
  Administered 2023-03-24 – 2023-03-26 (×3): 20 mg via ORAL
  Filled 2023-03-22 (×4): qty 2

## 2023-03-22 MED ORDER — ALBUTEROL SULFATE (2.5 MG/3ML) 0.083% IN NEBU: 3.0000 mL | INHALATION_SOLUTION | Freq: Four times a day (QID) | RESPIRATORY_TRACT | Status: DC | PRN

## 2023-03-22 MED ORDER — BISACODYL 10 MG RE SUPP: 10.0000 mg | Freq: Every day | RECTAL | Status: DC | PRN

## 2023-03-22 MED ORDER — MUPIROCIN 2 % EX OINT
1.0000 | TOPICAL_OINTMENT | Freq: Two times a day (BID) | CUTANEOUS | Status: DC
  Administered 2023-03-22 – 2023-03-23 (×2): 1 via NASAL
  Filled 2023-03-22 (×2): qty 22

## 2023-03-22 MED ORDER — ACETAMINOPHEN 500 MG PO TABS
1000.0000 mg | ORAL_TABLET | Freq: Four times a day (QID) | ORAL | Status: DC
  Administered 2023-03-22 (×2): 1000 mg via ORAL
  Filled 2023-03-22 (×3): qty 2

## 2023-03-22 MED ORDER — GABAPENTIN 300 MG PO CAPS: 300.0000 mg | ORAL_CAPSULE | ORAL | Status: DC

## 2023-03-22 MED ORDER — CEFAZOLIN SODIUM-DEXTROSE 2-4 GM/100ML-% IV SOLN
2.0000 g | INTRAVENOUS | Status: AC
  Administered 2023-03-23: 2 g via INTRAVENOUS
  Filled 2023-03-22: qty 100

## 2023-03-22 MED ORDER — MORPHINE SULFATE (PF) 4 MG/ML IV SOLN: 4.0000 mg | INTRAVENOUS | Status: DC | PRN

## 2023-03-22 MED ORDER — OXYCODONE HCL 5 MG PO TABS: 5.0000 mg | ORAL_TABLET | ORAL | Status: DC | PRN

## 2023-03-22 MED ORDER — FENTANYL CITRATE PF 50 MCG/ML IJ SOSY
PREFILLED_SYRINGE | INTRAMUSCULAR | Status: AC
  Administered 2023-03-22: 50 ug
  Filled 2023-03-22: qty 1

## 2023-03-22 MED ORDER — ZOLPIDEM TARTRATE 5 MG PO TABS
5.0000 mg | ORAL_TABLET | Freq: Every evening | ORAL | Status: DC | PRN
  Administered 2023-03-22: 5 mg via ORAL
  Filled 2023-03-22: qty 1

## 2023-03-22 MED ORDER — MORPHINE SULFATE (PF) 2 MG/ML IV SOLN
2.0000 mg | INTRAVENOUS | Status: DC | PRN
  Administered 2023-03-22 – 2023-03-23 (×4): 2 mg via INTRAVENOUS
  Filled 2023-03-22 (×4): qty 1

## 2023-03-22 MED ORDER — KETAMINE HCL 50 MG/5ML IJ SOSY
50.0000 mg | PREFILLED_SYRINGE | INTRAMUSCULAR | Status: DC | PRN
  Filled 2023-03-22 (×2): qty 5

## 2023-03-22 MED ORDER — OXYCODONE HCL 5 MG PO TABS
15.0000 mg | ORAL_TABLET | ORAL | Status: DC | PRN
  Administered 2023-03-23: 20 mg via ORAL
  Filled 2023-03-22 (×2): qty 4

## 2023-03-22 MED ORDER — MIDAZOLAM HCL 2 MG/2ML IJ SOLN
2.0000 mg | Freq: Once | INTRAMUSCULAR | Status: DC
  Filled 2023-03-22: qty 2

## 2023-03-22 MED ORDER — ONDANSETRON HCL 4 MG PO TABS: 4.0000 mg | ORAL_TABLET | Freq: Four times a day (QID) | ORAL | Status: DC | PRN

## 2023-03-22 MED ORDER — DOCUSATE SODIUM 100 MG PO CAPS
100.0000 mg | ORAL_CAPSULE | Freq: Two times a day (BID) | ORAL | Status: DC
  Administered 2023-03-22: 100 mg via ORAL
  Filled 2023-03-22 (×2): qty 1

## 2023-03-22 NOTE — ED Triage Notes (Signed)
Pt BIB EMS due to a fall. Pt was walking up a ramp and tripped. Pt has obvious tib/fib deformity to right side.No LOC, pt denies blood thinners. Pt received 100 of fentanyl and 30mg  of ketamine via EMS. Axox4. VSS

## 2023-03-22 NOTE — ED Notes (Signed)
Trauma Response Nurse Documentation   Alan Sanders is a 52 y.o. male arriving to [facility] ED via [facility]  On No antithrombotic. Trauma was activated as a Level 2 by Charge RN  based on the following trauma criteria Discretion of Emergency Department Physician.  Patient cleared for CT by Dr. Suezanne Jacquet.  GCS 15.  History   Past Medical History:  Diagnosis Date   Anxiety    situational anxiety-on meds   Arthritis    neck/back/LEFT wrist   Asthma    PRN inhaler   Balance problem    Depression    on meds   Family history of colonic polyps 05/08/2022   Gait difficulty    GERD (gastroesophageal reflux disease)    OTC PRN meds   Headache    migraines   History of colonic polyps 05/08/2022   Inguinal hernia of left side without obstruction or gangrene    Pneumonia    Seasonal allergies    Seizures (HCC)    " its been along time ,since my last seizure "   Spleen enlarged    Thrombocytopenia (HCC)    Tobacco abuse      Past Surgical History:  Procedure Laterality Date   ANTERIOR CERVICAL DECOMP/DISCECTOMY FUSION N/A 04/16/2021   Procedure: Cervical three-four  Anterior cervical decompression/discectomy/fusion;  Surgeon: Bethann Goo, DO;  Location: [facility] OR;  Service: Neurosurgery;  Laterality: N/A;   COLONOSCOPY  03/2022   [place]-[facility]-suprep(adeq)-many TA's-1 piecemeal   INGUINAL HERNIA REPAIR Right 2006   POLYPECTOMY  03/2022   many TA's-1 piecemeal   SURAL NERVE BX Right 12/11/2020   Procedure: SURAL NERVE BIOPSY;  Surgeon: Bethann Goo, DO;  Location: [facility] OR;  Service: Neurosurgery;  Laterality: Right;       Initial Focused Assessment (If applicable, or please see trauma documentation): Airway -- clear Breathing - unlabored Circulation - obvious deformed right ankle, pulses present   Interventions:   Labs Pain meds Xrays admit Plan for disposition:  Admission to floor   Consults completed:  Orthopaedic Surgeon /PA at bedside 1740  Event Summary: See  primary RN note.    Bedside handoff with ED RN Delorise Jackson.    Lesle Chris Rosalena Mccorry  Trauma Response RN  Please call TRN at [phone] for further assistance.

## 2023-03-22 NOTE — ED Notes (Signed)
ED TO INPATIENT HANDOFF REPORT  ED Nurse Name and Phone #: TOri [phone]  S Name/Age/Gender Alan Sanders 52 y.o. male Room/Bed: TRAAC/TRAAC  Code Status   Code Status: Full Code  Home/SNF/Other Home Patient oriented to: self, place, time, and situation Is this baseline? Yes   Triage Complete: Triage complete  Chief Complaint Closed right tibial fracture [S82.201A]  Triage Note Pt BIB EMS due to a fall. Pt was walking up a ramp and tripped. Pt has obvious tib/fib deformity to right side.No LOC, pt denies blood thinners. Pt received 100 of fentanyl and 30mg  of ketamine via EMS. Axox4. VSS   Allergies Allergies  Allergen Reactions   Hydromorphone Itching and Rash    Level of Care/Admitting Diagnosis ED Disposition     ED Disposition  Admit   Condition  --   Comment  Hospital Area: [facility] [[id number]]  Level of Care: Med-Surg [16]  May admit patient to [facility] or [facility] if equivalent level of care is available:: No  Covid Evaluation: Asymptomatic - no recent exposure (last 10 days) testing not required  Diagnosis: Closed right tibial fracture [161096]  Admitting Physician: Jones Broom [phone]  Attending Physician: HANDY, MICHAEL [[id number]]  Certification:: I certify this patient will need inpatient services for at least 2 midnights  Estimated Length of Stay: 2          B Medical/Surgery History Past Medical History:  Diagnosis Date   Anxiety    situational anxiety-on meds   Arthritis    neck/back/LEFT wrist   Asthma    PRN inhaler   Balance problem    Depression    on meds   Family history of colonic polyps 05/08/2022   Gait difficulty    GERD (gastroesophageal reflux disease)    OTC PRN meds   Headache    migraines   History of colonic polyps 05/08/2022   Inguinal hernia of left side without obstruction or gangrene    Pneumonia    Seasonal allergies    Seizures (HCC)    " its been along time ,since my last seizure "    Spleen enlarged    Thrombocytopenia (HCC)    Tobacco abuse    Past Surgical History:  Procedure Laterality Date   ANTERIOR CERVICAL DECOMP/DISCECTOMY FUSION N/A 04/16/2021   Procedure: Cervical three-four  Anterior cervical decompression/discectomy/fusion;  Surgeon: Bethann Goo, DO;  Location: [facility] OR;  Service: Neurosurgery;  Laterality: N/A;   COLONOSCOPY  03/2022   [place]-MAC-suprep(adeq)-many TA's-1 piecemeal   INGUINAL HERNIA REPAIR Right 2006   POLYPECTOMY  03/2022   many TA's-1 piecemeal   SURAL NERVE BX Right 12/11/2020   Procedure: SURAL NERVE BIOPSY;  Surgeon: Bethann Goo, DO;  Location: [facility] OR;  Service: Neurosurgery;  Laterality: Right;     A IV Location/Drains/Wounds Patient Lines/Drains/Airways Status     Active Line/Drains/Airways     Name Placement date Placement time Site Days   Peripheral IV 03/22/23 18 G Anterior;Distal;Right;Upper Arm 03/22/23  1545  Arm  less than 1            Intake/Output Last 24 hours No intake or output data in the 24 hours ending 03/22/23 1754  Labs/Imaging Results for orders placed or performed during the hospital encounter of 03/22/23 (from the past 48 hour(s))  Comprehensive metabolic panel     Status: Abnormal   Collection Time: 03/22/23  3:43 PM  Result Value Ref Range   Sodium 137 135 - 145 mmol/L  Potassium 3.9 3.5 - 5.1 mmol/L   Chloride 99 98 - 111 mmol/L   CO2 26 22 - 32 mmol/L   Glucose, Bld 126 (H) 70 - 99 mg/dL    Comment: Glucose reference range applies only to samples taken after fasting for at least 8 hours.   BUN 9 6 - 20 mg/dL   Creatinine, Ser 5.40 0.61 - 1.24 mg/dL   Calcium 9.6 8.9 - 98.1 mg/dL   Total Protein 7.7 6.5 - 8.1 g/dL   Albumin 4.7 3.5 - 5.0 g/dL   AST 53 (H) 15 - 41 U/L   ALT 69 (H) 0 - 44 U/L   Alkaline Phosphatase 87 38 - 126 U/L   Total Bilirubin 1.1 0.3 - 1.2 mg/dL   GFR, Estimated >19 >14 mL/min    Comment: (NOTE) Calculated using the CKD-EPI Creatinine Equation (2021)     Anion gap 12 5 - 15    Comment: Performed at [facility], [address], [place], [place] [address]  I-Stat Chem 8, ED     Status: Abnormal   Collection Time: 03/22/23  3:43 PM  Result Value Ref Range   Sodium 139 135 - 145 mmol/L   Potassium 3.9 3.5 - 5.1 mmol/L   Chloride 100 98 - 111 mmol/L   BUN 9 6 - 20 mg/dL   Creatinine, Ser 6.21 0.61 - 1.24 mg/dL   Glucose, Bld 308 (H) 70 - 99 mg/dL    Comment: Glucose reference range applies only to samples taken after fasting for at least 8 hours.   Calcium, Ion 1.18 1.15 - 1.40 mmol/L   TCO2 26 22 - 32 mmol/L   Hemoglobin 17.0 13.0 - 17.0 g/dL   HCT 65.7 84.6 - 96.2 %  CBC     Status: Abnormal   Collection Time: 03/22/23  3:43 PM  Result Value Ref Range   WBC 8.3 4.0 - 10.5 K/uL   RBC 6.11 (H) 4.22 - 5.81 MIL/uL   Hemoglobin 17.1 (H) 13.0 - 17.0 g/dL   HCT 95.2 84.1 - 32.4 %   MCV 81.3 80.0 - 100.0 fL   MCH 28.0 26.0 - 34.0 pg   MCHC 34.4 30.0 - 36.0 g/dL   RDW 40.1 02.7 - 25.3 %   Platelets 146 (L) 150 - 400 K/uL   nRBC 0.0 0.0 - 0.2 %    Comment: Performed at [facility], [address], [place], [place] [address]  Ethanol     Status: None   Collection Time: 03/22/23  3:43 PM  Result Value Ref Range   Alcohol, Ethyl (B) <10 <10 mg/dL    Comment: (NOTE) Lowest detectable limit for serum alcohol is 10 mg/dL.  For medical purposes only. Performed at [facility], [address] [place], [place] [address]   Lactic acid, plasma     Status: Abnormal   Collection Time: 03/22/23  3:43 PM  Result Value Ref Range   Lactic Acid, Venous 2.5 (HH) 0.5 - 1.9 mmol/L    Comment: CRITICAL RESULT CALLED TO, READ BACK BY AND VERIFIED WITH V,VALDEZ RN @1632  03/22/23 E,BENTON Performed at [facility], [address], [place], [place] [address]   Protime-INR     Status: None   Collection Time: 03/22/23  3:43 PM  Result Value Ref Range   Prothrombin Time 13.0 11.4 - 15.2 seconds   INR 1.0 0.8 - 1.2    Comment:  (NOTE) INR goal varies based on  device and disease states. Performed at [facility], [address] [place], [place] [address]   Sample to Blood Bank     Status: None   Collection Time: 03/22/23  3:43 PM  Result Value Ref Range   Blood Bank Specimen SAMPLE AVAILABLE FOR TESTING    Sample Expiration      03/25/2023,2359 Performed at [facility], [address], [place], [place] [address]    DG Foot Complete Right  Result Date: 03/22/2023 CLINICAL DATA:  Right tibial and fibular diaphyseal fractures. Postreduction. EXAM: RIGHT FOOT COMPLETE - 3+ VIEW; RIGHT ANKLE - COMPLETE 3+ VIEW COMPARISON:  Radiographs of the right lower leg and ankle same date. FINDINGS: A plaster splint has been applied which limits bone detail. Unchanged alignment of the oblique fracture through the distal tibial diaphysis with approximately 1.4 cm of posterior displacement. No acute osseous findings are identified within the visualized foot. The ankle mortise appears intact. There is no dislocation. IMPRESSION: Unchanged alignment of the distal tibial diaphyseal fracture following closed reduction. Electronically Signed   By: Carey Bullocks M.D.   On: 03/22/2023 17:16   DG Ankle Complete Right  Result Date: 03/22/2023 CLINICAL DATA:  Right tibial and fibular diaphyseal fractures. Postreduction. EXAM: RIGHT FOOT COMPLETE - 3+ VIEW; RIGHT ANKLE - COMPLETE 3+ VIEW COMPARISON:  Radiographs of the right lower leg and ankle same date. FINDINGS: A plaster splint has been applied which limits bone detail. Unchanged alignment of the oblique fracture through the distal tibial diaphysis with approximately 1.4 cm of posterior displacement. No acute osseous findings are identified within the visualized foot. The ankle mortise appears intact. There is no dislocation. IMPRESSION: Unchanged alignment of the distal tibial diaphyseal fracture following closed reduction. Electronically Signed   By: Carey Bullocks M.D.   On:  03/22/2023 17:16   DG Chest Portable 1 View  Result Date: 03/22/2023 CLINICAL DATA:  Patient tripped and fell. Right lower leg deformity. EXAM: PORTABLE CHEST 1 VIEW COMPARISON:  Radiographs 11/03/2022 and 12/10/2021.  CT 11/03/2022. FINDINGS: 1612 hours. There are lower lung volumes with mildly increased atelectasis superimposed on bibasilar scarring. No edema, confluent airspace opacity, pleural effusion or pneumothorax demonstrated. The heart size and mediastinal contours are stable. There is no evidence of acute fracture. Telemetry leads overlie the chest. IMPRESSION: Lower lung volumes with mildly increased atelectasis superimposed on bibasilar scarring. No evidence of acute chest injury. Electronically Signed   By: Carey Bullocks M.D.   On: 03/22/2023 16:26   DG Tibia/Fibula Right  Result Date: 03/22/2023 CLINICAL DATA:  Pain after trauma EXAM: RIGHT TIBIA AND FIBULA - 2 VIEW COMPARISON:  None Available. FINDINGS: There is a displaced fracture through the distal third of the tibial diaphysis. There is a displaced fracture through the proximal third of the fibular diaphysis. No other acute fractures are identified. A well corticated calcification is identified adjacent to the distal tip of the fibula with no overlying soft tissue swelling, likely sequela of a remote injury. IMPRESSION: Displaced fractures through the distal third of the tibial diaphysis and proximal third of the fibular diaphysis. No other acute fractures identified. Electronically Signed   By: Gerome Sam III M.D.   On: 03/22/2023 16:06   DG Ankle Right Port  Result Date: 03/22/2023 CLINICAL DATA:  Pain EXAM: PORTABLE RIGHT ANKLE - 2 VIEW COMPARISON:  None Available. FINDINGS: There is a displaced fracture through the distal third of the tibial diaphysis. No other bony or soft tissue abnormalities are  identified. IMPRESSION: Displaced fracture through the distal third of the tibial diaphysis. Electronically Signed   By: Gerome Sam III M.D.   On: 03/22/2023 16:05   DG Knee Right Port  Result Date: 03/22/2023 CLINICAL DATA:  Pain after trauma EXAM: PORTABLE RIGHT KNEE - 1-2 VIEW COMPARISON:  None Available. FINDINGS: There is a displaced fracture through the fibular diaphysis, incompletely assessed. The femur and tibia are unremarkable. No other fracture. No effusion. Mild enthesopathic changes off the superior patella. IMPRESSION: Displaced fracture through the fibular diaphysis, incompletely assessed. No other abnormalities. Electronically Signed   By: Gerome Sam III M.D.   On: 03/22/2023 16:03    Pending Labs Unresulted Labs (From admission, onward)     Start     Ordered   03/22/23 1535  Urinalysis, Routine w reflex microscopic -Urine, Clean Catch  (Trauma Panel)  Once,   URGENT       Question:  Specimen Source  Answer:  Urine, Clean Catch   03/22/23 1536   Signed and Held  CBC  Tomorrow morning,   R        Signed and Held   Signed and Held  Comprehensive metabolic panel  Tomorrow morning,   R        Signed and Held            Vitals/Pain Today's Vitals   03/22/23 1603 03/22/23 1604 03/22/23 1605 03/22/23 1608  BP: (!) 151/94  (!) 145/96 136/70  Pulse: (!) 105 (!) 103 97   Resp: (!) 32 20 20   Temp:      TempSrc:      SpO2: (!) 89% 94% 97%   Weight:      Height:      PainSc:        Isolation Precautions No active isolations  Medications Medications  ketamine 50 mg in normal saline 5 mL (10 mg/mL) syringe (has no administration in time range)  morphine (PF) 4 MG/ML injection 4 mg (has no administration in time range)  morphine (PF) 2 MG/ML injection (  Not Given 03/22/23 1704)  fentaNYL (SUBLIMAZE) 50 MCG/ML injection (50 mcg  Given 03/22/23 1558)  ondansetron (ZOFRAN) 4 MG/2ML injection (4 mg  Given 03/22/23 1557)  ketamine (KETALAR) injection (30 mg Intravenous Given 03/22/23 1559)  ondansetron (ZOFRAN) 4 MG/2ML injection (4 mg  Given 03/22/23 1557)  LORazepam (ATIVAN) injection 0.5  mg (0.5 mg Intravenous Given 03/22/23 1657)  sodium chloride 0.9 % bolus 1,000 mL (1,000 mLs Intravenous New Bag/Given 03/22/23 1748)    Mobility walks     Focused Assessments Skeletal    R Recommendations: See Admitting Provider Note  Report given to:   Additional Notes: axox4, VSS

## 2023-03-22 NOTE — H&P (Signed)
Alan Sanders is an 52 y.o. male.   Chief Complaint: right lower leg pain HPI: The patient is a 52 year old male who presents with the chief complaint of right lower leg pain. He reports that earlier today he slipped and fell on a ramp at home. He denies LOC or dizziness at the time of fall. Reports that he did not hit his head. He reports immediate pain in the right leg and notes deformity in the right lower leg. States that he did not have any open wounds or abrasions about the right leg. He was transported to the ED via EMS where xrays confirmed displaced fracture through the proximal third of the fibula and displaced fracture of the distal third of the tibia. Patient right LE splinted and in ED room at time of ortho consult. Patient reports a history of seizures during childhood but "none since". Denies history of MI, stroke, diabetes, HTN.   Past Medical History:  Diagnosis Date   Anxiety    situational anxiety-on meds   Arthritis    neck/back/LEFT wrist   Asthma    PRN inhaler   Balance problem    Depression    on meds   Family history of colonic polyps 05/08/2022   Gait difficulty    GERD (gastroesophageal reflux disease)    OTC PRN meds   Headache    migraines   History of colonic polyps 05/08/2022   Inguinal hernia of left side without obstruction or gangrene    Pneumonia    Seasonal allergies    Seizures (HCC)    " its been along time ,since my last seizure "   Spleen enlarged    Thrombocytopenia (HCC)    Tobacco abuse     Past Surgical History:  Procedure Laterality Date   ANTERIOR CERVICAL DECOMP/DISCECTOMY FUSION N/A 04/16/2021   Procedure: Cervical three-four  Anterior cervical decompression/discectomy/fusion;  Surgeon: Bethann Goo, DO;  Location: [facility] OR;  Service: Neurosurgery;  Laterality: N/A;   COLONOSCOPY  03/2022   [place]-MAC-suprep(adeq)-many TA's-1 piecemeal   INGUINAL HERNIA REPAIR Right 2006   POLYPECTOMY  03/2022   many TA's-1 piecemeal   SURAL NERVE  BX Right 12/11/2020   Procedure: SURAL NERVE BIOPSY;  Surgeon: Bethann Goo, DO;  Location: [facility] OR;  Service: Neurosurgery;  Laterality: Right;    Family History  Problem Relation Age of Onset   Diabetes Mother    Hypertension Mother    COPD Mother    Colon polyps Father        unknown number   Alcoholism Brother    Colon cancer Neg Hx    Esophageal cancer Neg Hx    Stomach cancer Neg Hx    Rectal cancer Neg Hx    Social History:  reports that he has been smoking cigarettes. He has a 12.00 pack-year smoking history. His smokeless tobacco use includes snuff. He reports that he does not drink alcohol and does not use drugs.  Allergies:  Allergies  Allergen Reactions   Hydromorphone Itching and Rash     Results for orders placed or performed during the hospital encounter of 03/22/23 (from the past 48 hour(s))  Comprehensive metabolic panel     Status: Abnormal   Collection Time: 03/22/23  3:43 PM  Result Value Ref Range   Sodium 137 135 - 145 mmol/L   Potassium 3.9 3.5 - 5.1 mmol/L   Chloride 99 98 - 111 mmol/L   CO2 26 22 - 32 mmol/L  Glucose, Bld 126 (H) 70 - 99 mg/dL    Comment: Glucose reference range applies only to samples taken after fasting for at least 8 hours.   BUN 9 6 - 20 mg/dL   Creatinine, Ser 1.61 0.61 - 1.24 mg/dL   Calcium 9.6 8.9 - 09.6 mg/dL   Total Protein 7.7 6.5 - 8.1 g/dL   Albumin 4.7 3.5 - 5.0 g/dL   AST 53 (H) 15 - 41 U/L   ALT 69 (H) 0 - 44 U/L   Alkaline Phosphatase 87 38 - 126 U/L   Total Bilirubin 1.1 0.3 - 1.2 mg/dL   GFR, Estimated >04 >54 mL/min    Comment: (NOTE) Calculated using the CKD-EPI Creatinine Equation (2021)    Anion gap 12 5 - 15    Comment: Performed at [facility], [address] [place], [place] [address]  I-Stat Chem 8, ED     Status: Abnormal   Collection Time: 03/22/23  3:43 PM  Result Value Ref Range   Sodium 139 135 - 145 mmol/L   Potassium 3.9 3.5 - 5.1 mmol/L   Chloride 100 98 - 111 mmol/L   BUN 9  6 - 20 mg/dL   Creatinine, Ser 9.14 0.61 - 1.24 mg/dL   Glucose, Bld 782 (H) 70 - 99 mg/dL    Comment: Glucose reference range applies only to samples taken after fasting for at least 8 hours.   Calcium, Ion 1.18 1.15 - 1.40 mmol/L   TCO2 26 22 - 32 mmol/L   Hemoglobin 17.0 13.0 - 17.0 g/dL   HCT 95.6 21.3 - 08.6 %  CBC     Status: Abnormal   Collection Time: 03/22/23  3:43 PM  Result Value Ref Range   WBC 8.3 4.0 - 10.5 K/uL   RBC 6.11 (H) 4.22 - 5.81 MIL/uL   Hemoglobin 17.1 (H) 13.0 - 17.0 g/dL   HCT 57.8 46.9 - 62.9 %   MCV 81.3 80.0 - 100.0 fL   MCH 28.0 26.0 - 34.0 pg   MCHC 34.4 30.0 - 36.0 g/dL   RDW 52.8 41.3 - 24.4 %   Platelets 146 (L) 150 - 400 K/uL   nRBC 0.0 0.0 - 0.2 %    Comment: Performed at [facility], [address] [place], [place] [address]  Ethanol     Status: None   Collection Time: 03/22/23  3:43 PM  Result Value Ref Range   Alcohol, Ethyl (B) <10 <10 mg/dL    Comment: (NOTE) Lowest detectable limit for serum alcohol is 10 mg/dL.  For medical purposes only. Performed at [facility], [address] [place], [place] [address]   Lactic acid, plasma     Status: Abnormal   Collection Time: 03/22/23  3:43 PM  Result Value Ref Range   Lactic Acid, Venous 2.5 (HH) 0.5 - 1.9 mmol/L    Comment: CRITICAL RESULT CALLED TO, READ BACK BY AND VERIFIED WITH V,VALDEZ RN @1632  03/22/23 E,BENTON Performed at [facility], [address] [place], [place] [address]   Protime-INR     Status: None   Collection Time: 03/22/23  3:43 PM  Result Value Ref Range   Prothrombin Time 13.0 11.4 - 15.2 seconds   INR 1.0 0.8 - 1.2    Comment: (NOTE) INR goal varies based on device and disease states. Performed at [facility], [address], [place], [place] [address]   Sample to Blood Bank  Status: None   Collection Time: 03/22/23  3:43 PM  Result Value Ref Range   Blood Bank Specimen SAMPLE AVAILABLE FOR TESTING    Sample Expiration       03/25/2023,2359 Performed at [facility], [address] [place], [place] [address]    DG Tibia/Fibula Right  Result Date: 03/22/2023 CLINICAL DATA:  Pain after trauma EXAM: RIGHT TIBIA AND FIBULA - 2 VIEW COMPARISON:  None Available. FINDINGS: There is a displaced fracture through the distal third of the tibial diaphysis. There is a displaced fracture through the proximal third of the fibular diaphysis. No other acute fractures are identified. A well corticated calcification is identified adjacent to the distal tip of the fibula with no overlying soft tissue swelling, likely sequela of a remote injury. IMPRESSION: Displaced fractures through the distal third of the tibial diaphysis and proximal third of the fibular diaphysis. No other acute fractures identified. Electronically Signed   By: Gerome Sam III M.D.   On: 03/22/2023 16:06   DG Ankle Right Port  Result Date: 03/22/2023 CLINICAL DATA:  Pain EXAM: PORTABLE RIGHT ANKLE - 2 VIEW COMPARISON:  None Available. FINDINGS: There is a displaced fracture through the distal third of the tibial diaphysis. No other bony or soft tissue abnormalities are identified. IMPRESSION: Displaced fracture through the distal third of the tibial diaphysis. Electronically Signed   By: Gerome Sam III M.D.   On: 03/22/2023 16:05   DG Knee Right Port  Result Date: 03/22/2023 CLINICAL DATA:  Pain after trauma EXAM: PORTABLE RIGHT KNEE - 1-2 VIEW COMPARISON:  None Available. FINDINGS: There is a displaced fracture through the fibular diaphysis, incompletely assessed. The femur and tibia are unremarkable. No other fracture. No effusion. Mild enthesopathic changes off the superior patella. IMPRESSION: Displaced fracture through the fibular diaphysis, incompletely assessed. No other abnormalities. Electronically Signed   By: Gerome Sam III M.D.   On: 03/22/2023 16:03    Review of Systems  Constitutional:  Negative for chills and fever.  Respiratory:   Negative for shortness of breath.   Cardiovascular:  Negative for chest pain and palpitations.  Gastrointestinal:  Negative for nausea and vomiting.  Musculoskeletal:  Positive for arthralgias.  Neurological:  Negative for dizziness, seizures, weakness, numbness and headaches.  Psychiatric/Behavioral:  Negative for confusion. The patient is not nervous/anxious.     Blood pressure 136/70, pulse 97, temperature 97.7 F (36.5 C), temperature source Temporal, resp. rate 20, height 6\' 1"  (1.854 m), weight 97.5 kg, SpO2 97 %. Physical Exam Constitutional:      Appearance: Normal appearance. He is normal weight. He is not ill-appearing or toxic-appearing.  HENT:     Head: Normocephalic and atraumatic.  Eyes:     Extraocular Movements: Extraocular movements intact.  Cardiovascular:     Rate and Rhythm: Normal rate and regular rhythm.     Pulses: Normal pulses.  Pulmonary:     Effort: Pulmonary effort is normal.  Musculoskeletal:     Cervical back: Normal range of motion.     Right upper leg: No tenderness.     Left upper leg: Normal.     Right knee: No swelling. No tenderness.     Left knee: Normal.     Right lower leg: Swelling, deformity and tenderness present.     Left lower leg: Normal.     Right ankle: Decreased range of motion.     Left ankle: Normal.     Left Achilles Tendon: Normal.  Right foot: Normal range of motion.     Left foot: Normal.     Comments: Patient is right LE splint. Able to move toes on right foot. Left calf soft and nontender.   Skin:    Capillary Refill: Capillary refill takes less than 2 seconds.  Neurological:     General: No focal deficit present.     Mental Status: He is alert and oriented to person, place, and time. Mental status is at baseline.  Psychiatric:        Mood and Affect: Mood normal.        Behavior: Behavior normal.      Assessment/Plan Closed right displaced tibia and fibula fractures Admission orders placed. Maintain splint on  right LE. Elevate RLE. Bed rest, NWB right LE. Will hold any anticoagulation in anticipation of surgery tomorrow with Dr Carola Frost. Patient may eat now but orders placed for NPO after midnight. Plan for ORIF right tib/fib.   Mollyann Halbert L. [place], PA-C 03/22/2023, 5:41 PM

## 2023-03-22 NOTE — Progress Notes (Signed)
Transition of Care [facility]) - CAGE-AID Screening   Patient Details  Name: Alan Sanders MRN: [id number] Date of Birth: Nov 15, 1971   Hewitt Shorts, RN Trauma response Nurse Phone Number: [phone] 03/22/2023, 6:32 PM   Clinical Narrative:    CAGE-AID Screening:    Have You Ever Felt You Ought to Cut Down on Your Drinking or Drug Use?: No Have People Annoyed You By [employer] Your Drinking Or Drug Use?: No Have You Felt Bad Or Guilty About Your Drinking Or Drug Use?: No Have You Ever Had a Drink or Used Drugs First Thing In The Morning to Steady Your Nerves or to Get Rid of a Hangover?: No CAGE-AID Score: 0  Substance Abuse Education Offered: No (denies any alcohol or drug use)

## 2023-03-22 NOTE — Progress Notes (Signed)
Patient arrived to unit at [facility], patient alert and oriented. Vital signs stable, report and care handed off to oncoming night shift nurse.

## 2023-03-22 NOTE — Progress Notes (Signed)
Orthopedic Tech Progress Note Patient Details:  GEORDIE NOONEY [birth year] [id number]  Level II trauma. EMS splint was removed and replaced with a well-padded plaster posterior & stirrup splint to the RLE. Sensation, motion, and pulse distal to the injury remain intact. Brisk cap refill was also noted. At this time pt has no complaints of any irritation or discomfort caused specifically by the splint.   Ortho Devices Type of Ortho Device: Stirrup splint, Post (long leg) splint Ortho Device/Splint Location: RLE Ortho Device/Splint Interventions: Ordered, Application, Adjustment   Post Interventions Patient Tolerated: Poor, Fair Instructions Provided: Care of device  Lennan Malone Carmine Savoy 03/22/2023, 5:48 PM

## 2023-03-22 NOTE — ED Provider Notes (Cosign Needed Addendum)
[place] EMERGENCY DEPARTMENT AT [facility] Provider Note  Medical Decision Making   HPI: Alan Sanders is a 52 y.o. male with no perinent history who presents complaining of right lower extremity pain. Patient arrived via EMS from scene.  History provided by patient, EMS.  No interpreter required for this encounter.  Patient reports that just prior to arrival he was walking up a ramp outside of his house and he had a trip and fall.  He did not hit his head or lose consciousness, he complains of pain from his right knee to his right lower extremity, reports that he has decreased sensation on the medial aspect of his right foot.  Denies use of blood thinners.  EMS reports that patient was vitally stable en route, they put a soft splint on the lower extremity, patient received 100 of fentanyl and 30 of ketamine en route with EMS with minimal effect.  ROS: As per HPI. Please see MAR for complete past medical history, surgical history, and social history.   Physical exam is pertinent for tibial deformity medially with external rotation of the foot, no overt breaks in the skin, decreased sensation along the medial portion.  The differential includes but is not limited to ICH, TBI, skull fracture, spinal fracture/dislocation, blunt thoracic trauma, hemothorax, pneumothorax, rib fractures, blunt abdominal trauma, hemorrhage, extremity fracture, dislocation.  Additional history obtained from: EMS External records from outside source obtained and reviewed including: None  ED provider interpretation of ECG: Rate 89, sinus rhythm, nonspecific T wave inversion in lead V1, T wave flattening in lead aVL.  No ST elevations or depressions.  ED provider interpretation of radiology/imaging:  CXR: No acute cardiac or pulmonary abnormality. No appreciable rib fx. No PTX.  Extremity XR's: Personally reviewed plain films of the right knee, tib-fib, ankle, foot, I appreciate displaced fractures of  the proximal fibula as well as the distal tibia  Labs ordered were interpreted by myself as well as my attending and were incorporated into the medical decision making process for this patient.  ED provider interpretation of labs:  CBC: CBC without leukocytosis, mildly elevated hemoglobin.  Mild thrombocytopenia, improved from prior. CMP: Without AKI or emergent electrolyte derangements Coags: WNL LA: Mildly elevated, consistent with trauma Ethanol: WNL  Interventions: Morphine, ketamine, Zofran, fentanyl, saline bolus,  See the EMR for full details regarding lab and imaging results.  Prior to arrival of the patient, the room was prepared with the following: code cart to bedside, video intubation scope, suction, BVM. Trauma team was present as this was a level 2 trauma, however trauma nursing was present.  Patient was activated given EMS concern for lack of right lower extremity pulses, however on physical exam patient has present capillary refill as well as pulses.  Does have decreased sensation along the medial aspect of the right foot that does not follow any specific neurovascular distribution.  Upon arrival, the patient was transferred over to the trauma bed. ABCs intact as exam above. Once IVs were confirmed, the secondary exam was performed, and findings are noted above.  Portable XRs performed at the bedside.  Portable X-rays revealed displaced fractures of the tibia and fibula patient with significant pain and obvious deformity on exam, to help alleviate pain, ketamine sedation and closed reduction with splinting performed at the bedside, please see procedure note below, overall uncomplicated.  Currently, patient is awake, alert, and protecting own airway and is hemodynamically stable.  Orthopedic surgery consulted, spoke with Dr. Ave Filter.  He  biases that x-ray imaging is sufficient, no CT imaging is necessary.  It is that the PA will evaluate the patient at the bedside.  PA presented  to bedside, evaluated the patient, patient will be admitted to the orthopedic surgery service for ORIF tomorrow morning.  Wife and son presented to bedside and were provided updates.  No additional acute mental patient was under my care in the ED.  Consults: Orthopedic surgery   Disposition: ADMIT: I believe the patient requires admission for further care and management. The patient was admitted to orthopedic surgery. Please see inpatient provider note for additional treatment plan details.   The plan for this patient was discussed with Dr. Suezanne Jacquet, who voiced agreement and who oversaw evaluation and treatment of this patient.  Clinical Impression:  1. Closed fracture of right lower extremity, initial encounter    Admit  Therapies: These medications and interventions were provided for the patient while in the ED. Medications  ketamine 50 mg in normal saline 5 mL (10 mg/mL) syringe (has no administration in time range)  morphine (PF) 4 MG/ML injection 4 mg (has no administration in time range)  morphine (PF) 2 MG/ML injection (  Not Given 03/22/23 1704)  fentaNYL (SUBLIMAZE) 50 MCG/ML injection (50 mcg  Given 03/22/23 1558)  ondansetron (ZOFRAN) 4 MG/2ML injection (4 mg  Given 03/22/23 1557)  ketamine (KETALAR) injection (30 mg Intravenous Given 03/22/23 1559)  ondansetron (ZOFRAN) 4 MG/2ML injection (4 mg  Given 03/22/23 1557)  LORazepam (ATIVAN) injection 0.5 mg (0.5 mg Intravenous Given 03/22/23 1657)  sodium chloride 0.9 % bolus 1,000 mL (1,000 mLs Intravenous New Bag/Given 03/22/23 1748)    MDM generated using voice dictation software and may contain dictation errors.  Please contact me for any clarification or with any questions.  Clinical Complexity A medically appropriate history, review of systems, and physical exam was performed.  Collateral history obtained from: EMS I personally reviewed the labs, EKG, imaging as discussed above. Patient's presentation is most consistent with  acute presentation with potential threat to life or bodily function Treatment: Admission, surgical intervention Medications: Parenteral controlled substances Discussed patient's care with providers from the following different specialties: Orthopedic surgery  Physical Exam   ED Triage Vitals  Enc Vitals Group     BP 03/22/23 1550 115/63     Pulse Rate 03/22/23 1550 70     Resp 03/22/23 1550 (!) 23     Temp 03/22/23 1600 97.7 F (36.5 C)     Temp Source 03/22/23 1600 Temporal     SpO2 03/22/23 1550 100 %     Weight 03/22/23 1601 215 lb (97.5 kg)     Height 03/22/23 1601 6\' 1"  (1.854 m)     Head Circumference --      Peak Flow --      Pain Score 03/22/23 1600 10     Pain Loc --      Pain Edu? --      Excl. in GC? --   ]  Physical Exam Vitals and nursing note reviewed.  Constitutional:      Appearance: He is well-developed.     Comments: Uncomfortable appearing, intermittently yelling  HENT:     Head: Normocephalic and atraumatic.  Eyes:     Conjunctiva/sclera: Conjunctivae normal.  Cardiovascular:     Rate and Rhythm: Normal rate and regular rhythm.     Heart sounds: No murmur heard. Pulmonary:     Effort: Pulmonary effort is normal. No respiratory distress.  Breath sounds: Normal breath sounds.  Abdominal:     Palpations: Abdomen is soft.     Tenderness: There is no abdominal tenderness.  Musculoskeletal:        General: Deformity (to distal medial tibia, below level of defect RLE abnormally externally rotated) present.     Cervical back: Neck supple.     Comments: Chest wall stable to compression, clavicle stable compression, pelvis stable to compression  Skin:    General: Skin is warm and dry.     Capillary Refill: Capillary refill takes less than 2 seconds.  Neurological:     Mental Status: He is alert.  Psychiatric:        Mood and Affect: Mood normal.       Procedure Note  .Critical Care  Performed by: Curley Spice, MD Authorized by: Lonell Grandchild, MD   Critical care provider statement:    Critical care time (minutes):  35   Critical care was necessary to treat or prevent imminent or life-threatening deterioration of the following conditions:  Trauma   Critical care was time spent personally by me on the following activities:  Development of treatment plan with patient or surrogate, discussions with consultants, evaluation of patient's response to treatment, ordering and performing treatments and interventions, ordering and review of radiographic studies and re-evaluation of patient's condition   Care discussed with: admitting provider   .Sedation  Date/Time: 03/22/2023 6:38 PM  Performed by: Curley Spice, MD Authorized by: Lonell Grandchild, MD   Consent:    Consent obtained:  Verbal   Consent given by:  Patient   Risks discussed:  Allergic reaction, dysrhythmia, nausea, inadequate sedation, prolonged hypoxia resulting in organ damage, prolonged sedation necessitating reversal, respiratory compromise necessitating ventilatory assistance and intubation and vomiting   Alternatives discussed:  Analgesia without sedation Universal protocol:    Imaging studies available: yes     Immediately prior to procedure, a time out was called: yes     Patient identity confirmed:  Verbally with patient Indications:    Procedure performed:  Fracture reduction   Procedure necessitating sedation performed by:  Physician performing sedation Pre-sedation assessment:    Time since last food or drink:  2   NPO status caution: urgency dictates proceeding with non-ideal NPO status     ASA classification: class 2 - patient with mild systemic disease     Mouth opening:  2 finger widths   Mallampati score:  II - soft palate, uvula, fauces visible   Neck mobility: normal     Pre-sedation assessments completed and reviewed: airway patency, cardiovascular function, hydration status, mental status, nausea/vomiting, pain level, respiratory function  and temperature     History of difficult intubation: yes   Immediate pre-procedure details:    Reassessment: Patient reassessed immediately prior to procedure     Reviewed: vital signs     Verified: bag valve mask available, emergency equipment available, intubation equipment available, IV patency confirmed, oxygen available, reversal medications available and suction available   Procedure details (see MAR for exact dosages):    Preoxygenation:  Room air   Sedation:  Ketamine   Intra-procedure monitoring:  Blood pressure monitoring, cardiac monitor, continuous capnometry, continuous pulse oximetry, frequent LOC assessments and frequent vital sign checks   Intra-procedure events: none     Total Provider sedation time (minutes):  15 Post-procedure details:    Attendance: Constant attendance by certified staff until patient recovered     Recovery: Patient returned to pre-procedure baseline  Post-sedation assessments completed and reviewed: airway patency, cardiovascular function, hydration status, mental status, nausea/vomiting, pain level and respiratory function     Patient is stable for discharge or admission: yes     Procedure completion:  Tolerated well, no immediate complications .Ortho Injury Treatment  Date/Time: 03/22/2023 6:40 PM  Performed by: Curley Spice, MD Authorized by: Lonell Grandchild, MD   Consent:    Consent obtained:  Verbal   Consent given by:  Patient   Risks discussed:  Fracture, vascular damage, restricted joint movement and stiffness   Alternatives discussed:  No treatment, immobilization and delayed treatmentInjury location: lower leg Pre-procedure distal perfusion: normal Pre-procedure neurological function comment: decreased sensation in medial foot Pre-procedure range of motion: reduced  Anesthesia: Local anesthesia used: no  Patient sedated: Yes. Refer to sedation procedure documentation for details of sedation. Immobilization: splint Splint  type: long leg Splint Applied by: Ortho Tech Supplies used: plaster Post-procedure distal perfusion: normal Post-procedure neurological function comment: unchanged Post-procedure range of motion: unchanged     DG Foot Complete Right  Final Result    DG Ankle Complete Right  Final Result    DG Chest Portable 1 View  Final Result    DG Tibia/Fibula Right  Final Result    DG Knee Right Port  Final Result    DG Ankle Right Port  Final Result      Julianne Rice, MD Emergency Medicine, PGY-2   Curley Spice, MD 03/22/23 Ovidio Kin    Curley Spice, MD 03/22/23 Ebony Cargo    Lonell Grandchild, MD 03/24/23 1210

## 2023-03-23 ENCOUNTER — Other Ambulatory Visit: Payer: Self-pay

## 2023-03-23 ENCOUNTER — Other Ambulatory Visit (HOSPITAL_COMMUNITY): Payer: Self-pay

## 2023-03-23 ENCOUNTER — Inpatient Hospital Stay (HOSPITAL_COMMUNITY): Payer: Commercial Managed Care - PPO

## 2023-03-23 ENCOUNTER — Encounter (HOSPITAL_COMMUNITY)
Admission: EM | Disposition: A | Discharge status: Home Health | Payer: Self-pay | Source: Home / Self Care | Attending: Orthopedic Surgery

## 2023-03-23 ENCOUNTER — Encounter (HOSPITAL_COMMUNITY): Payer: Self-pay | Admitting: Orthopedic Surgery

## 2023-03-23 ENCOUNTER — Inpatient Hospital Stay (HOSPITAL_COMMUNITY): Payer: Commercial Managed Care - PPO | Admitting: Certified Registered"

## 2023-03-23 DIAGNOSIS — J45909 Unspecified asthma, uncomplicated: Secondary | ICD-10-CM | POA: Diagnosis not present

## 2023-03-23 DIAGNOSIS — S82201A Unspecified fracture of shaft of right tibia, initial encounter for closed fracture: Secondary | ICD-10-CM

## 2023-03-23 DIAGNOSIS — S82401A Unspecified fracture of shaft of right fibula, initial encounter for closed fracture: Secondary | ICD-10-CM

## 2023-03-23 DIAGNOSIS — F17210 Nicotine dependence, cigarettes, uncomplicated: Secondary | ICD-10-CM | POA: Diagnosis not present

## 2023-03-23 HISTORY — PX: TIBIA IM NAIL INSERTION: SHX2516

## 2023-03-23 LAB — URINALYSIS, ROUTINE W REFLEX MICROSCOPIC
Bilirubin Urine: NEGATIVE
Glucose, UA: NEGATIVE mg/dL
Hgb urine dipstick: NEGATIVE
Ketones, ur: NEGATIVE mg/dL
Leukocytes,Ua: NEGATIVE
Nitrite: NEGATIVE
Protein, ur: NEGATIVE mg/dL
Specific Gravity, Urine: 1.029 (ref 1.005–1.030)
pH: 5 (ref 5.0–8.0)

## 2023-03-23 LAB — COMPREHENSIVE METABOLIC PANEL
ALT: 53 U/L — ABNORMAL HIGH (ref 0–44)
AST: 34 U/L (ref 15–41)
Albumin: 3.9 g/dL (ref 3.5–5.0)
Alkaline Phosphatase: 79 U/L (ref 38–126)
Anion gap: 8 (ref 5–15)
BUN: 7 mg/dL (ref 6–20)
CO2: 23 mmol/L (ref 22–32)
Calcium: 8.7 mg/dL — ABNORMAL LOW (ref 8.9–10.3)
Chloride: 100 mmol/L (ref 98–111)
Creatinine, Ser: 0.82 mg/dL (ref 0.61–1.24)
GFR, Estimated: >60 mL/min (ref >60)
Glucose, Bld: 127 mg/dL — ABNORMAL HIGH (ref 70–99)
Potassium: 3.6 mmol/L (ref 3.5–5.1)
Sodium: 131 mmol/L — ABNORMAL LOW (ref 135–145)
Total Bilirubin: 0.9 mg/dL (ref 0.3–1.2)
Total Protein: 6.4 g/dL — ABNORMAL LOW (ref 6.5–8.1)

## 2023-03-23 LAB — CBC
HCT: 41 % (ref 39.0–52.0)
Hemoglobin: 14 g/dL (ref 13.0–17.0)
MCH: 27.8 pg (ref 26.0–34.0)
MCHC: 34.1 g/dL (ref 30.0–36.0)
MCV: 81.3 fL (ref 80.0–100.0)
Platelets: 118 10*3/uL — ABNORMAL LOW (ref 150–400)
RBC: 5.04 MIL/uL (ref 4.22–5.81)
RDW: 15.4 % (ref 11.5–15.5)
WBC: 10.2 10*3/uL (ref 4.0–10.5)
nRBC: 0 % (ref 0.0–0.2)

## 2023-03-23 LAB — VITAMIN D 25 HYDROXY (VIT D DEFICIENCY, FRACTURES): Vit D, 25-Hydroxy: 8.7 ng/mL — ABNORMAL LOW (ref 30–100)

## 2023-03-23 LAB — SURGICAL PCR SCREEN
MRSA, PCR: POSITIVE — AB
Staphylococcus aureus: POSITIVE — AB

## 2023-03-23 SURGERY — INSERTION, INTRAMEDULLARY ROD, TIBIA
Anesthesia: General | Site: Leg Upper | Laterality: Right

## 2023-03-23 MED ORDER — ONDANSETRON HCL 4 MG/2ML IJ SOLN
INTRAMUSCULAR | Status: AC
  Filled 2023-03-23: qty 2

## 2023-03-23 MED ORDER — ROCURONIUM BROMIDE 10 MG/ML (PF) SYRINGE
PREFILLED_SYRINGE | INTRAVENOUS | Status: AC
  Filled 2023-03-23: qty 10

## 2023-03-23 MED ORDER — SENNOSIDES-DOCUSATE SODIUM 8.6-50 MG PO TABS: 1.0000 | ORAL_TABLET | Freq: Every evening | ORAL | Status: DC | PRN

## 2023-03-23 MED ORDER — GUAIFENESIN-DM 100-10 MG/5ML PO SYRP
5.0000 mL | ORAL_SOLUTION | ORAL | Status: DC | PRN
  Filled 2023-03-23: qty 10

## 2023-03-23 MED ORDER — POTASSIUM CHLORIDE IN NACL 20-0.9 MEQ/L-% IV SOLN
INTRAVENOUS | Status: DC
  Filled 2023-03-23: qty 1000

## 2023-03-23 MED ORDER — METOCLOPRAMIDE HCL 5 MG/ML IJ SOLN: 5.0000 mg | Freq: Three times a day (TID) | INTRAMUSCULAR | Status: DC | PRN

## 2023-03-23 MED ORDER — ORAL CARE MOUTH RINSE: 15.0000 mL | Freq: Once | OROMUCOSAL | Status: AC

## 2023-03-23 MED ORDER — ONDANSETRON HCL 4 MG/2ML IJ SOLN
INTRAMUSCULAR | Status: DC | PRN
  Administered 2023-03-23: 4 mg via INTRAVENOUS

## 2023-03-23 MED ORDER — CHLORHEXIDINE GLUCONATE 0.12 % MT SOLN
15.0000 mL | Freq: Once | OROMUCOSAL | Status: AC
  Administered 2023-03-23: 15 mL via OROMUCOSAL
  Filled 2023-03-23: qty 15

## 2023-03-23 MED ORDER — MIDAZOLAM HCL 2 MG/2ML IJ SOLN
INTRAMUSCULAR | Status: DC | PRN
  Administered 2023-03-23: 2 mg via INTRAVENOUS

## 2023-03-23 MED ORDER — DEXAMETHASONE SODIUM PHOSPHATE 10 MG/ML IJ SOLN
INTRAMUSCULAR | Status: DC | PRN
  Administered 2023-03-23: 10 mg via INTRAVENOUS

## 2023-03-23 MED ORDER — MORPHINE SULFATE (PF) 2 MG/ML IV SOLN
2.0000 mg | INTRAVENOUS | Status: DC | PRN
  Administered 2023-03-24: 2 mg via INTRAVENOUS
  Filled 2023-03-23: qty 1

## 2023-03-23 MED ORDER — ACETAMINOPHEN 325 MG PO TABS
325.0000 mg | ORAL_TABLET | Freq: Four times a day (QID) | ORAL | Status: DC | PRN
  Administered 2023-03-25 – 2023-03-26 (×2): 650 mg via ORAL
  Filled 2023-03-23 (×2): qty 2

## 2023-03-23 MED ORDER — ENOXAPARIN SODIUM 40 MG/0.4ML IJ SOSY
40.0000 mg | PREFILLED_SYRINGE | INTRAMUSCULAR | Status: DC
  Administered 2023-03-24 – 2023-03-25 (×2): 40 mg via SUBCUTANEOUS
  Filled 2023-03-23 (×2): qty 0.4

## 2023-03-23 MED ORDER — 0.9 % SODIUM CHLORIDE (POUR BTL) OPTIME
TOPICAL | Status: DC | PRN
  Administered 2023-03-23: 1000 mL

## 2023-03-23 MED ORDER — PHENOL 1.4 % MT LIQD: 1.0000 | OROMUCOSAL | Status: DC | PRN

## 2023-03-23 MED ORDER — MORPHINE SULFATE (PF) 2 MG/ML IV SOLN
INTRAVENOUS | Status: AC
  Filled 2023-03-23: qty 1

## 2023-03-23 MED ORDER — ROCURONIUM BROMIDE 10 MG/ML (PF) SYRINGE
PREFILLED_SYRINGE | INTRAVENOUS | Status: DC | PRN
  Administered 2023-03-23: 60 mg via INTRAVENOUS

## 2023-03-23 MED ORDER — MUPIROCIN 2 % EX OINT
1.0000 | TOPICAL_OINTMENT | Freq: Two times a day (BID) | CUTANEOUS | 0 refills | Status: AC
Start: 2023-03-23 — End: 2023-04-22
  Filled 2023-03-23: qty 66, 30d supply, fill #0
  Filled 2023-03-23: qty 60, 30d supply, fill #0
  Filled 2023-04-18: qty 66, 33d supply, fill #0

## 2023-03-23 MED ORDER — PROPOFOL 10 MG/ML IV BOLUS
INTRAVENOUS | Status: DC | PRN
  Administered 2023-03-23: 150 mg via INTRAVENOUS

## 2023-03-23 MED ORDER — DOCUSATE SODIUM 100 MG PO CAPS
100.0000 mg | ORAL_CAPSULE | Freq: Two times a day (BID) | ORAL | Status: DC
  Administered 2023-03-23 – 2023-03-26 (×6): 100 mg via ORAL
  Filled 2023-03-23 (×6): qty 1

## 2023-03-23 MED ORDER — ONDANSETRON HCL 4 MG PO TABS: 4.0000 mg | ORAL_TABLET | Freq: Four times a day (QID) | ORAL | Status: DC | PRN

## 2023-03-23 MED ORDER — METOCLOPRAMIDE HCL 5 MG PO TABS: 5.0000 mg | ORAL_TABLET | Freq: Three times a day (TID) | ORAL | Status: DC | PRN

## 2023-03-23 MED ORDER — SUGAMMADEX SODIUM 200 MG/2ML IV SOLN
INTRAVENOUS | Status: DC | PRN
  Administered 2023-03-23: 200 mg via INTRAVENOUS

## 2023-03-23 MED ORDER — OXYCODONE HCL 5 MG PO TABS: 5.0000 mg | ORAL_TABLET | Freq: Once | ORAL | Status: DC | PRN

## 2023-03-23 MED ORDER — ONDANSETRON HCL 4 MG/2ML IJ SOLN
4.0000 mg | Freq: Four times a day (QID) | INTRAMUSCULAR | Status: DC | PRN
  Administered 2023-03-24: 4 mg via INTRAVENOUS
  Filled 2023-03-23: qty 2

## 2023-03-23 MED ORDER — ALUM & MAG HYDROXIDE-SIMETH 200-200-20 MG/5ML PO SUSP: 30.0000 mL | ORAL | Status: DC | PRN

## 2023-03-23 MED ORDER — VANCOMYCIN HCL IN DEXTROSE 1-5 GM/200ML-% IV SOLN
1000.0000 mg | INTRAVENOUS | Status: AC
  Administered 2023-03-23: 1000 mg via INTRAVENOUS
  Filled 2023-03-23: qty 200

## 2023-03-23 MED ORDER — METHOCARBAMOL 1000 MG/10ML IJ SOLN
500.0000 mg | Freq: Three times a day (TID) | INTRAVENOUS | Status: DC
  Filled 2023-03-23: qty 5

## 2023-03-23 MED ORDER — KETOROLAC TROMETHAMINE 15 MG/ML IJ SOLN
15.0000 mg | Freq: Four times a day (QID) | INTRAMUSCULAR | Status: AC
  Administered 2023-03-23 – 2023-03-24 (×4): 15 mg via INTRAVENOUS
  Filled 2023-03-23 (×4): qty 1

## 2023-03-23 MED ORDER — SUCCINYLCHOLINE CHLORIDE 200 MG/10ML IV SOSY
PREFILLED_SYRINGE | INTRAVENOUS | Status: DC | PRN
  Administered 2023-03-23: 120 mg via INTRAVENOUS

## 2023-03-23 MED ORDER — LIDOCAINE 2% (20 MG/ML) 5 ML SYRINGE
INTRAMUSCULAR | Status: AC
  Filled 2023-03-23: qty 5

## 2023-03-23 MED ORDER — KETAMINE HCL 50 MG/5ML IJ SOSY
PREFILLED_SYRINGE | INTRAMUSCULAR | Status: AC
  Filled 2023-03-23: qty 5

## 2023-03-23 MED ORDER — CHLORHEXIDINE GLUCONATE 4 % EX SOLN
1.0000 | CUTANEOUS | 1 refills | Status: DC
  Filled 2023-03-23 – 2023-04-18 (×2): qty 946, fill #0
  Filled 2023-07-11: qty 946, 30d supply, fill #0

## 2023-03-23 MED ORDER — LACTATED RINGERS IV SOLN: INTRAVENOUS | Status: DC

## 2023-03-23 MED ORDER — MIDAZOLAM HCL 2 MG/2ML IJ SOLN
INTRAMUSCULAR | Status: AC
  Filled 2023-03-23: qty 2

## 2023-03-23 MED ORDER — CEFAZOLIN SODIUM-DEXTROSE 1-4 GM/50ML-% IV SOLN
1.0000 g | Freq: Four times a day (QID) | INTRAVENOUS | Status: AC
  Administered 2023-03-23 (×2): 1 g via INTRAVENOUS
  Filled 2023-03-23 (×3): qty 50

## 2023-03-23 MED ORDER — CEFAZOLIN SODIUM-DEXTROSE 2-4 GM/100ML-% IV SOLN
INTRAVENOUS | Status: AC
  Filled 2023-03-23: qty 100

## 2023-03-23 MED ORDER — FENTANYL CITRATE (PF) 250 MCG/5ML IJ SOLN
INTRAMUSCULAR | Status: AC
  Filled 2023-03-23: qty 5

## 2023-03-23 MED ORDER — ONDANSETRON HCL 4 MG/2ML IJ SOLN: 4.0000 mg | Freq: Once | INTRAMUSCULAR | Status: DC | PRN

## 2023-03-23 MED ORDER — LIP MEDEX EX OINT: 1.0000 | TOPICAL_OINTMENT | CUTANEOUS | Status: DC | PRN

## 2023-03-23 MED ORDER — METHOCARBAMOL 500 MG PO TABS
1000.0000 mg | ORAL_TABLET | Freq: Three times a day (TID) | ORAL | Status: DC
  Administered 2023-03-23 – 2023-03-26 (×8): 1000 mg via ORAL
  Filled 2023-03-23 (×8): qty 2

## 2023-03-23 MED ORDER — OXYCODONE HCL 5 MG PO TABS
10.0000 mg | ORAL_TABLET | ORAL | Status: DC | PRN
  Administered 2023-03-23 – 2023-03-24 (×2): 15 mg via ORAL
  Filled 2023-03-23 (×2): qty 3

## 2023-03-23 MED ORDER — DIPHENHYDRAMINE HCL 12.5 MG/5ML PO ELIX
12.5000 mg | ORAL_SOLUTION | ORAL | Status: DC | PRN
  Administered 2023-03-24 (×2): 25 mg via ORAL
  Filled 2023-03-23 (×2): qty 10

## 2023-03-23 MED ORDER — OXYCODONE HCL 5 MG PO TABS: 5.0000 mg | ORAL_TABLET | ORAL | Status: DC | PRN

## 2023-03-23 MED ORDER — LIDOCAINE 2% (20 MG/ML) 5 ML SYRINGE
INTRAMUSCULAR | Status: DC | PRN
  Administered 2023-03-23: 60 mg via INTRAVENOUS

## 2023-03-23 MED ORDER — FENTANYL CITRATE (PF) 250 MCG/5ML IJ SOLN
INTRAMUSCULAR | Status: DC | PRN
  Administered 2023-03-23: 100 ug via INTRAVENOUS

## 2023-03-23 MED ORDER — BISACODYL 5 MG PO TBEC
5.0000 mg | DELAYED_RELEASE_TABLET | Freq: Every day | ORAL | Status: DC | PRN
  Administered 2023-03-25: 5 mg via ORAL
  Filled 2023-03-23: qty 1

## 2023-03-23 MED ORDER — PROPOFOL 10 MG/ML IV BOLUS
INTRAVENOUS | Status: AC
  Filled 2023-03-23: qty 20

## 2023-03-23 MED ORDER — MORPHINE SULFATE (PF) 2 MG/ML IV SOLN
1.0000 mg | INTRAVENOUS | Status: DC | PRN
  Administered 2023-03-23 (×2): 2 mg via INTRAVENOUS

## 2023-03-23 MED ORDER — OXYCODONE HCL 5 MG/5ML PO SOLN: 5.0000 mg | Freq: Once | ORAL | Status: DC | PRN

## 2023-03-23 MED ORDER — ACETAMINOPHEN 500 MG PO TABS
1000.0000 mg | ORAL_TABLET | Freq: Four times a day (QID) | ORAL | Status: DC
  Administered 2023-03-23 – 2023-03-24 (×4): 1000 mg via ORAL
  Filled 2023-03-23 (×4): qty 2

## 2023-03-23 SURGICAL SUPPLY — 64 items
BAG COUNTER SPONGE SURGICOUNT (BAG) ×1 IMPLANT
BAG SPNG CNTER NS LX DISP (BAG) ×1
BIT DRILL FREE HAND 4.2X130 (DRILL) IMPLANT
BIT DRILL LOCK 4.2X360 (DRILL) IMPLANT
BLADE SURG 10 STRL SS (BLADE) ×1 IMPLANT
BNDG CMPR 5X6 CHSV STRCH STRL (GAUZE/BANDAGES/DRESSINGS) ×1
BNDG COHESIVE 6X5 TAN ST LF (GAUZE/BANDAGES/DRESSINGS) IMPLANT
BNDG ELASTIC 4X5.8 VLCR STR LF (GAUZE/BANDAGES/DRESSINGS) ×1 IMPLANT
BNDG ELASTIC 6X5.8 VLCR STR LF (GAUZE/BANDAGES/DRESSINGS) ×1 IMPLANT
BNDG GAUZE DERMACEA FLUFF 4 (GAUZE/BANDAGES/DRESSINGS) IMPLANT
BNDG GZE DERMACEA 4 6PLY (GAUZE/BANDAGES/DRESSINGS) ×1
BOOT STEPPER DURA LG (SOFTGOODS) IMPLANT
BRUSH SCRUB EZ PLAIN DRY (MISCELLANEOUS) ×2 IMPLANT
COVER SURGICAL LIGHT HANDLE (MISCELLANEOUS) ×2 IMPLANT
DRAPE C-ARM 42X72 X-RAY (DRAPES) ×1 IMPLANT
DRAPE C-ARMOR (DRAPES) ×1 IMPLANT
DRAPE HALF SHEET 40X57 (DRAPES) IMPLANT
DRAPE INCISE IOBAN 66X45 STRL (DRAPES) IMPLANT
DRAPE U-SHAPE 47X51 STRL (DRAPES) ×1 IMPLANT
DRAPE U-SHAPE 76X120 STRL (DRAPES) IMPLANT
DRILL FREE HAND 4.2X130 (DRILL) ×1
DRILL LOCK 4.2X360 (DRILL) ×1
DRSG ADAPTIC 3X8 NADH LF (GAUZE/BANDAGES/DRESSINGS) ×1 IMPLANT
DRSG MEPITEL 4X7.2 (GAUZE/BANDAGES/DRESSINGS) ×1 IMPLANT
ELECT REM PT RETURN 9FT ADLT (ELECTROSURGICAL) ×1
ELECTRODE REM PT RTRN 9FT ADLT (ELECTROSURGICAL) ×1 IMPLANT
GAUZE PAD ABD 8X10 STRL (GAUZE/BANDAGES/DRESSINGS) ×2 IMPLANT
GAUZE SPONGE 4X4 12PLY STRL (GAUZE/BANDAGES/DRESSINGS) ×1 IMPLANT
GLOVE BIO SURGEON STRL SZ7.5 (GLOVE) ×1 IMPLANT
GLOVE BIO SURGEON STRL SZ8 (GLOVE) ×1 IMPLANT
GLOVE BIO SURGEON STRL SZ8.5 (GLOVE) ×1 IMPLANT
GLOVE BIOGEL PI IND STRL 7.5 (GLOVE) ×1 IMPLANT
GLOVE BIOGEL PI IND STRL 8 (GLOVE) ×1 IMPLANT
GLOVE SURG ORTHO LTX SZ7.5 (GLOVE) ×2 IMPLANT
GOWN STRL REUS W/ TWL LRG LVL3 (GOWN DISPOSABLE) ×2 IMPLANT
GOWN STRL REUS W/ TWL XL LVL3 (GOWN DISPOSABLE) ×1 IMPLANT
GOWN STRL REUS W/TWL LRG LVL3 (GOWN DISPOSABLE) ×2
GOWN STRL REUS W/TWL XL LVL3 (GOWN DISPOSABLE) ×1
GUIDEWIRE GAMMA 800 (WIRE) IMPLANT
K-WIRE 3X285 (WIRE) ×1
KIT BASIN OR (CUSTOM PROCEDURE TRAY) ×1 IMPLANT
KIT TURNOVER KIT B (KITS) ×1 IMPLANT
KWIRE 3X285 (WIRE) IMPLANT
NAIL IM TIB 10X390 STL (Nail) IMPLANT
PACK ORTHO EXTREMITY (CUSTOM PROCEDURE TRAY) ×1 IMPLANT
PAD ARMBOARD 7.5X6 YLW CONV (MISCELLANEOUS) ×2 IMPLANT
PAD CAST 4YDX4 CTTN HI CHSV (CAST SUPPLIES) ×1 IMPLANT
PADDING CAST COTTON 4X4 STRL (CAST SUPPLIES) ×1
PADDING CAST COTTON 6X4 STRL (CAST SUPPLIES) ×1 IMPLANT
REAMER INTRAMEDULLARY 8MM 510 (MISCELLANEOUS) IMPLANT
SCREW LOCK T2 5X35 (Screw) IMPLANT
SCREW LOCK T2 5X45 (Screw) IMPLANT
SCREW LOCK T2 5X60 (Screw) IMPLANT
SCREW LOCK T2 5X75 (Screw) IMPLANT
STAPLER VISISTAT 35W (STAPLE) ×1 IMPLANT
STOCKINETTE IMPERVIOUS LG (DRAPES) IMPLANT
SUT ETHILON 2 0 FS 18 (SUTURE) ×2 IMPLANT
SUT VIC AB 0 CT1 27 (SUTURE)
SUT VIC AB 0 CT1 27XBRD ANBCTR (SUTURE) IMPLANT
SUT VIC AB 2-0 CT1 27 (SUTURE) ×1
SUT VIC AB 2-0 CT1 TAPERPNT 27 (SUTURE) ×1 IMPLANT
TOWEL GREEN STERILE (TOWEL DISPOSABLE) ×2 IMPLANT
TOWEL GREEN STERILE FF (TOWEL DISPOSABLE) ×1 IMPLANT
YANKAUER SUCT BULB TIP NO VENT (SUCTIONS) IMPLANT

## 2023-03-23 NOTE — Anesthesia Postprocedure Evaluation (Signed)
Anesthesia Post Note  Patient: Alan Sanders  Procedure(s) Performed: INTRAMEDULLARY (IM) NAIL TIBIAL (Right: Leg Upper)     Patient location during evaluation: PACU Anesthesia Type: General Level of consciousness: awake and alert Pain management: pain level controlled Vital Signs Assessment: post-procedure vital signs reviewed and stable Respiratory status: spontaneous breathing, nonlabored ventilation, respiratory function stable and patient connected to nasal cannula oxygen Cardiovascular status: blood pressure returned to baseline and stable Postop Assessment: no apparent nausea or vomiting Anesthetic complications: no  No notable events documented.  Last Vitals:  Vitals:   03/23/23 1745 03/23/23 1800  BP: 126/86 137/74  Pulse: 91 81  Resp: (!) 23 19  Temp:    SpO2: 91% 95%    Last Pain:  Vitals:   03/23/23 1404  TempSrc:   PainSc: 10-Worst pain ever                 [name],W. EDMOND

## 2023-03-23 NOTE — Anesthesia Procedure Notes (Addendum)
Procedure Name: Intubation Date/Time: 03/23/2023 3:38 PM  Performed by: Gus Puma, CRNAPre-anesthesia Checklist: Patient identified, Emergency Drugs available, Suction available and Patient being monitored Patient Re-evaluated:Patient Re-evaluated prior to induction Oxygen Delivery Method: Circle System Utilized Preoxygenation: Pre-oxygenation with 100% oxygen Induction Type: IV induction Ventilation: Mask ventilation without difficulty Laryngoscope Size: Glidescope, 3 and 4 Grade View: Grade I Tube type: Oral Tube size: 7.5 mm Number of attempts: 1 Airway Equipment and Method: Rigid stylet and Video-laryngoscopy Placement Confirmation: ETT inserted through vocal cords under direct vision, positive ETCO2 and breath sounds checked- equal and bilateral Secured at: 23 cm Tube secured with: Tape Dental Injury: Teeth and Oropharynx as per pre-operative assessment

## 2023-03-23 NOTE — Consult Note (Signed)
[facility] H&P  Patient ID: Alan Sanders MRN: [id number] DOB/AGE: 52-May-1972 52 y.o.  Chief Complaint: right tibia fibula fracture HPI: Alan Sanders is an 52 y.o. male.with baseline neuropathy who fell at home. Pain is currently poorly controlled. In a splint. Dull and aching, sharp and severe with motion, with baseline associated distal tingling or numbness, worse on the right than the left. Smoking 1 PPD now plus dipping.    Past Medical History:  Diagnosis Date   Anxiety    situational anxiety-on meds   Arthritis    neck/back/LEFT wrist   Asthma    PRN inhaler   Balance problem    Depression    on meds   Family history of colonic polyps 05/08/2022   Gait difficulty    GERD (gastroesophageal reflux disease)    OTC PRN meds   Headache    migraines   History of colonic polyps 05/08/2022   Inguinal hernia of left side without obstruction or gangrene    Pneumonia    Seasonal allergies    Seizures (HCC)    " its been along time ,since my last seizure "   Spleen enlarged    Thrombocytopenia (HCC)    Tobacco abuse     Past Surgical History:  Procedure Laterality Date   ANTERIOR CERVICAL DECOMP/DISCECTOMY FUSION N/A 04/16/2021   Procedure: Cervical three-four  Anterior cervical decompression/discectomy/fusion;  Surgeon: Bethann Goo, DO;  Location: [facility] OR;  Service: Neurosurgery;  Laterality: N/A;   COLONOSCOPY  03/2022   [place]-MAC-suprep(adeq)-many TA's-1 piecemeal   INGUINAL HERNIA REPAIR Right 2006   POLYPECTOMY  03/2022   many TA's-1 piecemeal   SURAL NERVE BX Right 12/11/2020   Procedure: SURAL NERVE BIOPSY;  Surgeon: Bethann Goo, DO;  Location: [facility] OR;  Service: Neurosurgery;  Laterality: Right;    Family History  Problem Relation Age of Onset   Diabetes Mother    Hypertension Mother    COPD Mother    Colon polyps Father        unknown number   Alcoholism Brother    Colon cancer Neg Hx    Esophageal cancer Neg Hx     Stomach cancer Neg Hx    Rectal cancer Neg Hx    Social History:  reports that he has been smoking cigarettes. He has a 12.00 pack-year smoking history. His smokeless tobacco use includes snuff. He reports that he does not drink alcohol and does not use drugs.  Allergies:  Allergies  Allergen Reactions   Hydromorphone Itching and Rash    Medications Prior to Admission  Medication Sig Dispense Refill   albuterol (VENTOLIN HFA) 108 (90 Base) MCG/ACT inhaler Inhale 1-2 puffs into the lungs every 6 (six) hours as needed for wheezing or shortness of breath. (Patient taking differently: Inhale 2-3 puffs into the lungs as needed for wheezing or shortness of breath.) 6.7 g 2   escitalopram (LEXAPRO) 10 MG tablet Take 1 tablet (10 mg total) by mouth daily for 7 days, THEN 2 tablets (20 mg total) daily. 77 tablet 0   gabapentin (NEURONTIN) 300 MG capsule Take 1 capsule (300 mg total) by mouth 3 (three) times daily. (Patient taking differently: Take 300-900 mg by mouth See admin instructions. Take 300 mg by mouth in the afternoon and 900 mg by mouth at bedtime.) 90 capsule 0   methocarbamol (ROBAXIN) 750 MG tablet Take 1 tablet (750 mg total) by mouth 3 (three) times daily as needed. 90 tablet  2   pantoprazole (PROTONIX) 40 MG tablet Take 1 tablet (40 mg total) by mouth daily. 30 tablet 0   QUEtiapine (SEROQUEL) 25 MG tablet TAKE 1 TABLET BY MOUTH EVERYDAY AT BEDTIME (Patient not taking: Reported on 12/30/2022) 90 tablet 1    Results for orders placed or performed during the hospital encounter of 03/22/23 (from the past 48 hour(s))  Comprehensive metabolic panel     Status: Abnormal   Collection Time: 03/22/23  3:43 PM  Result Value Ref Range   Sodium 137 135 - 145 mmol/L   Potassium 3.9 3.5 - 5.1 mmol/L   Chloride 99 98 - 111 mmol/L   CO2 26 22 - 32 mmol/L   Glucose, Bld 126 (H) 70 - 99 mg/dL    Comment: Glucose reference range applies only to samples taken after fasting for at least 8 hours.    BUN 9 6 - 20 mg/dL   Creatinine, Ser 6.57 0.61 - 1.24 mg/dL   Calcium 9.6 8.9 - 84.6 mg/dL   Total Protein 7.7 6.5 - 8.1 g/dL   Albumin 4.7 3.5 - 5.0 g/dL   AST 53 (H) 15 - 41 U/L   ALT 69 (H) 0 - 44 U/L   Alkaline Phosphatase 87 38 - 126 U/L   Total Bilirubin 1.1 0.3 - 1.2 mg/dL   GFR, Estimated >96 >29 mL/min    Comment: (NOTE) Calculated using the CKD-EPI Creatinine Equation (2021)    Anion gap 12 5 - 15    Comment: Performed at [facility], [address], [place], [place] [address]  I-Stat Chem 8, ED     Status: Abnormal   Collection Time: 03/22/23  3:43 PM  Result Value Ref Range   Sodium 139 135 - 145 mmol/L   Potassium 3.9 3.5 - 5.1 mmol/L   Chloride 100 98 - 111 mmol/L   BUN 9 6 - 20 mg/dL   Creatinine, Ser 3.24 0.61 - 1.24 mg/dL   Glucose, Bld 401 (H) 70 - 99 mg/dL    Comment: Glucose reference range applies only to samples taken after fasting for at least 8 hours.   Calcium, Ion 1.18 1.15 - 1.40 mmol/L   TCO2 26 22 - 32 mmol/L   Hemoglobin 17.0 13.0 - 17.0 g/dL   HCT 02.7 25.3 - 66.4 %  CBC     Status: Abnormal   Collection Time: 03/22/23  3:43 PM  Result Value Ref Range   WBC 8.3 4.0 - 10.5 K/uL   RBC 6.11 (H) 4.22 - 5.81 MIL/uL   Hemoglobin 17.1 (H) 13.0 - 17.0 g/dL   HCT 40.3 47.4 - 25.9 %   MCV 81.3 80.0 - 100.0 fL   MCH 28.0 26.0 - 34.0 pg   MCHC 34.4 30.0 - 36.0 g/dL   RDW 56.3 87.5 - 64.3 %   Platelets 146 (L) 150 - 400 K/uL   nRBC 0.0 0.0 - 0.2 %    Comment: Performed at [facility], [address] [place], [place] [address]  Ethanol     Status: None   Collection Time: 03/22/23  3:43 PM  Result Value Ref Range   Alcohol, Ethyl (B) <10 <10 mg/dL    Comment: (NOTE) Lowest detectable limit for serum alcohol is 10 mg/dL.  For medical purposes only. Performed at [facility], [address] [place], [place] [address]   Lactic acid, plasma     Status: Abnormal   Collection Time: 03/22/23  3:43 PM  Result Value Ref Range    Lactic Acid, Venous 2.5 (HH) 0.5 - 1.9 mmol/L    Comment: CRITICAL RESULT CALLED TO, READ BACK BY AND VERIFIED WITH V,VALDEZ RN @1632  03/22/23 E,[place] Performed at [facility], [address] [place], [place] [address]   Protime-INR     Status: None   Collection Time: 03/22/23  3:43 PM  Result Value Ref Range   Prothrombin Time 13.0 11.4 - 15.2 seconds   INR 1.0 0.8 - 1.2    Comment: (NOTE) INR goal varies based on device and disease states. Performed at [facility], [address], [place], [place] [address]   Sample to Blood Bank     Status: None   Collection Time: 03/22/23  3:43 PM  Result Value Ref Range   Blood Bank Specimen SAMPLE AVAILABLE FOR TESTING    Sample Expiration      03/25/2023,2359 Performed at [facility], [address], [place], [place] [address]   Surgical PCR screen     Status: Abnormal   Collection Time: 03/22/23 11:33 PM   Specimen: Nasal Mucosa; Nasal Swab  Result Value Ref Range   MRSA, PCR POSITIVE (A) NEGATIVE    Comment: RESULT CALLED TO, READ BACK BY AND VERIFIED WITH: T BURTON,RN@0140  03/23/23 MK    Staphylococcus aureus POSITIVE (A) NEGATIVE    Comment: (NOTE) The Xpert SA Assay (FDA approved for NASAL specimens in patients 51 years of age and older), is one component of a comprehensive surveillance program. It is not intended to diagnose infection nor to guide or monitor treatment. Performed at [facility], [address], [place], [place] [address]   CBC     Status: Abnormal   Collection Time: 03/23/23  1:47 AM  Result Value Ref Range   WBC 10.2 4.0 - 10.5 K/uL   RBC 5.04 4.22 - 5.81 MIL/uL   Hemoglobin 14.0 13.0 - 17.0 g/dL   HCT 29.5 62.1 - 30.8 %   MCV 81.3 80.0 - 100.0 fL   MCH 27.8 26.0 - 34.0 pg   MCHC 34.1 30.0 - 36.0 g/dL   RDW 65.7 84.6 - 96.2 %   Platelets 118 (L) 150 - 400 K/uL    Comment: REPEATED TO VERIFY   nRBC 0.0 0.0 - 0.2 %    Comment: Performed at [facility], [address], [place], [place] [address]  Comprehensive metabolic panel     Status: Abnormal   Collection Time: 03/23/23  1:47 AM  Result Value Ref Range   Sodium 131 (L) 135 - 145 mmol/L   Potassium 3.6 3.5 - 5.1 mmol/L   Chloride 100 98 - 111 mmol/L   CO2 23 22 - 32 mmol/L   Glucose, Bld 127 (H) 70 - 99 mg/dL    Comment: Glucose reference range applies only to samples taken after fasting for at least 8 hours.   BUN 7 6 - 20 mg/dL   Creatinine, Ser 1.32 0.61 - 1.24 mg/dL   Calcium 8.7 (L) 8.9 - 10.3 mg/dL   Total Protein 6.4 (L) 6.5 - 8.1 g/dL   Albumin 3.9 3.5 - 5.0 g/dL   AST 34 15 - 41 U/L   ALT 53 (H) 0 - 44 U/L   Alkaline Phosphatase 79 38 - 126 U/L   Total Bilirubin 0.9 0.3 - 1.2 mg/dL   GFR, Estimated >44 >01 mL/min    Comment: (NOTE) Calculated using the CKD-EPI Creatinine Equation (2021)  Anion gap 8 5 - 15    Comment: Performed at [facility], [address], [place], [place] [address]  VITAMIN D 25 Hydroxy (Vit-D Deficiency, Fractures)     Status: Abnormal   Collection Time: 03/23/23  1:47 AM  Result Value Ref Range   Vit D, 25-Hydroxy 8.70 (L) 30 - 100 ng/mL    Comment: (NOTE) Vitamin D deficiency has been defined by the Institute of Medicine  and an Endocrine Society practice guideline as a level of serum 25-OH  vitamin D less than 20 ng/mL (1,2). The Endocrine Society went on to  further define vitamin D insufficiency as a level between 21 and 29  ng/mL (2).  1. IOM (Institute of Medicine). 2010. Dietary reference intakes for  calcium and D. [place]: [employer]. 2. Holick MF, Binkley Atkins, Bischoff-Ferrari HA, et al. Evaluation,  treatment, and prevention of vitamin D deficiency: an Endocrine  Society clinical practice guideline, JCEM. 2011 Jul; 96(7): 1911-30.  Performed at [facility], [address] [place], [place] [address]   Urinalysis, Routine w reflex microscopic -Urine, Clean Catch     Status: None   Collection Time:  03/23/23  4:09 AM  Result Value Ref Range   Color, Urine YELLOW YELLOW   APPearance CLEAR CLEAR   Specific Gravity, Urine 1.029 1.005 - 1.030   pH 5.0 5.0 - 8.0   Glucose, UA NEGATIVE NEGATIVE mg/dL   Hgb urine dipstick NEGATIVE NEGATIVE   Bilirubin Urine NEGATIVE NEGATIVE   Ketones, ur NEGATIVE NEGATIVE mg/dL   Protein, ur NEGATIVE NEGATIVE mg/dL   Nitrite NEGATIVE NEGATIVE   Leukocytes,Ua NEGATIVE NEGATIVE    Comment: Performed at [facility], [address], [place], [place] [address]   DG Foot Complete Right  Result Date: 03/22/2023 CLINICAL DATA:  Right tibial and fibular diaphyseal fractures. Postreduction. EXAM: RIGHT FOOT COMPLETE - 3+ VIEW; RIGHT ANKLE - COMPLETE 3+ VIEW COMPARISON:  Radiographs of the right lower leg and ankle same date. FINDINGS: A plaster splint has been applied which limits bone detail. Unchanged alignment of the oblique fracture through the distal tibial diaphysis with approximately 1.4 cm of posterior displacement. No acute osseous findings are identified within the visualized foot. The ankle mortise appears intact. There is no dislocation. IMPRESSION: Unchanged alignment of the distal tibial diaphyseal fracture following closed reduction. Electronically Signed   By: Carey Bullocks M.D.   On: 03/22/2023 17:16   DG Ankle Complete Right  Result Date: 03/22/2023 CLINICAL DATA:  Right tibial and fibular diaphyseal fractures. Postreduction. EXAM: RIGHT FOOT COMPLETE - 3+ VIEW; RIGHT ANKLE - COMPLETE 3+ VIEW COMPARISON:  Radiographs of the right lower leg and ankle same date. FINDINGS: A plaster splint has been applied which limits bone detail. Unchanged alignment of the oblique fracture through the distal tibial diaphysis with approximately 1.4 cm of posterior displacement. No acute osseous findings are identified within the visualized foot. The ankle mortise appears intact. There is no dislocation. IMPRESSION: Unchanged alignment of the distal tibial  diaphyseal fracture following closed reduction. Electronically Signed   By: Carey Bullocks M.D.   On: 03/22/2023 17:16   DG Chest Portable 1 View  Result Date: 03/22/2023 CLINICAL DATA:  Patient tripped and fell. Right lower leg deformity. EXAM: PORTABLE CHEST 1 VIEW COMPARISON:  Radiographs 11/03/2022 and 12/10/2021.  CT 11/03/2022. FINDINGS: 1612 hours. There are lower lung volumes with mildly increased atelectasis superimposed on bibasilar scarring. No edema, confluent airspace opacity, pleural  effusion or pneumothorax demonstrated. The heart size and mediastinal contours are stable. There is no evidence of acute fracture. Telemetry leads overlie the chest. IMPRESSION: Lower lung volumes with mildly increased atelectasis superimposed on bibasilar scarring. No evidence of acute chest injury. Electronically Signed   By: Carey Bullocks M.D.   On: 03/22/2023 16:26   DG Tibia/Fibula Right  Result Date: 03/22/2023 CLINICAL DATA:  Pain after trauma EXAM: RIGHT TIBIA AND FIBULA - 2 VIEW COMPARISON:  None Available. FINDINGS: There is a displaced fracture through the distal third of the tibial diaphysis. There is a displaced fracture through the proximal third of the fibular diaphysis. No other acute fractures are identified. A well corticated calcification is identified adjacent to the distal tip of the fibula with no overlying soft tissue swelling, likely sequela of a remote injury. IMPRESSION: Displaced fractures through the distal third of the tibial diaphysis and proximal third of the fibular diaphysis. No other acute fractures identified. Electronically Signed   By: Gerome Sam III M.D.   On: 03/22/2023 16:06   DG Ankle Right Port  Result Date: 03/22/2023 CLINICAL DATA:  Pain EXAM: PORTABLE RIGHT ANKLE - 2 VIEW COMPARISON:  None Available. FINDINGS: There is a displaced fracture through the distal third of the tibial diaphysis. No other bony or soft tissue abnormalities are identified. IMPRESSION:  Displaced fracture through the distal third of the tibial diaphysis. Electronically Signed   By: Gerome Sam III M.D.   On: 03/22/2023 16:05   DG Knee Right Port  Result Date: 03/22/2023 CLINICAL DATA:  Pain after trauma EXAM: PORTABLE RIGHT KNEE - 1-2 VIEW COMPARISON:  None Available. FINDINGS: There is a displaced fracture through the fibular diaphysis, incompletely assessed. The femur and tibia are unremarkable. No other fracture. No effusion. Mild enthesopathic changes off the superior patella. IMPRESSION: Displaced fracture through the fibular diaphysis, incompletely assessed. No other abnormalities. Electronically Signed   By: Gerome Sam III M.D.   On: 03/22/2023 16:03    ROS No recent fever, bleeding abnormalities, urologic dysfunction, GI problems, or weight gain.  Blood pressure 125/72, pulse 87, temperature 99.8 F (37.7 C), temperature source Oral, resp. rate 18, height 6\' 1"  (1.854 m), weight 97.5 kg, SpO2 92 %. Physical Exam NCAT RRR Abd soft, ND, NT RLE Splint intact, clean, dry  Edema/ swelling controlled  Sens: DPN, SPN, TN all diminished and consistent with pre-injury neuropathy per patient.  Motor: EHL, FHL, and lessor toe ext and flex all intact grossly  Brisk cap refill, warm to touch   Assessment/Plan  Right tib fib fracture Baseline neuropathy  The risks and benefits of surgery for right tibia repair were discussed with the patient, including the possibility of infection, nerve injury, vessel injury, wound breakdown, arthritis, symptomatic hardware, DVT/ PE, loss of motion, malunion, nonunion, and need for further surgery among others. These risks were acknowledged and consent provided to proceed.  Myrene Galas, MD [facility], [facility] [phone]   03/23/2023, 2:48 PM  [facility] [address] [place] [address] [phone] (F)

## 2023-03-23 NOTE — Op Note (Signed)
03/23/2023 5:54 PM  PATIENT:  Alan Sanders 52 y.o.   DATE OF BIRTH: [birth year]  MEDICAL RECORD NUMBER: [id number]  PRE-OPERATIVE DIAGNOSIS:  Right Tibia and fibula fractures  POST-OPERATIVE DIAGNOSIS:  Right Tibia and fibula fractures  PROCEDURE:  Procedure(s): RIGHT TIBIAL SHAFT FRACTURE FIXATION (Left) with [employer] Alpha Nail 10 x 390 mm, statically locked  SURGEON:  Surgeon(s) and Role:    Myrene Galas, MD - Primary  ASSISTANTS: Montez Morita, PA-C  ANESTHESIA:   none  EBL:  Minimal   BLOOD ADMINISTERED: None  DRAINS: None   LOCAL MEDICATIONS USED:  NONE  SPECIMEN:  No Specimen  DISPOSITION OF SPECIMEN:  N/A  COUNTS:  YES  TOURNIQUET:  * No tourniquets in log *  DICTATION: .Note written in EPIC  PLAN OF CARE: Admit to inpatient   PATIENT DISPOSITION:  PACU - hemodynamically stable.   Delay start of Pharmacological VTE agent (>24hrs) due to surgical blood loss or risk of bleeding: no  BRIEF SUMMARY AND INDICATIONS FOR PROCEDURE:  [name] is a 52 y.o. who sustained a tibia fracture from ground level fall. I also discussed with the patient the risks and benefits of surgery, including the possibility of infection, nerve injury, vessel injury, wound breakdown, arthritis, symptomatic hardware, DVT/ PE, loss of motion, malunion, nonunion, heart attack, stroke, prolonged intubation, and need for further surgery among others. These risks were acknowledged and consent given to proceed.  BRIEF SUMMARY OF PROCEDURE:  The patient was taken to the operating room after administration of Ancef for antibiotics.  The operative extremity was prepped and draped in the usual fashion.  No tourniquet was used during the procedure.  A 2.5-cm incision was made at the base of the distal pole of patella and extended proximally. A medial parapatellar incision was made, and then the curved cannulated awl advanced into the center of the proximal tibia just medial to the lateral  tibial spine and just anterior to the joint surface.  A guidewire was then advanced across the fracture site into the middle of the plafond and checked on AP and LAT images, measuring for nail length on the lateral.  We then performed sequential reaming, encountering chatter at 10 mm, reaming up to 11 mm and placing a 10 x 390 mm nail. We were careful to watch alignment throughout and make sure distal locking bolts were anterior to the fibula. Two proximal locks were placed off the jig and checked for position and length, then the distal locks using perfect circle technique and checked for length also.  Because of the patient's proximal oblique fibula fracture suggesting possible instability, a stress evaluation was performed, consisting of external rotation of the ankle while holding it in the mortise view. Under live fluoro, I did not identify  any widening of the medial clear space nor widening of the syndesmotic interval. Consequently it was deemed stable.  An assistant was required for the procedure as my assistant performed the reaming and proximal instrumentation while I held reduction. Standard layered closure was performed. Montez Morita, PA-C assisted during reaming and nail placement, as well as wound closure.  The patient was taken to the PACU in stable condition after application of sterile gently compressive dressings.  PROGNOSIS:  The patient will be weightbearing as tolerated with unrestricted motion of the knee and ankle for the next 6 weeks. CAM boot for support as needed. Eliquis for DVT prophylaxis. F/u in the office in 10-14 days for removal of sutures. Vit D  deficiency is profound and will be addressed. Testosterone has also been ordered.   Doralee Albino. Carola Frost, M.D.

## 2023-03-23 NOTE — Plan of Care (Signed)

## 2023-03-23 NOTE — Transfer of Care (Signed)
Immediate Anesthesia Transfer of Care Note  Patient: Alan Sanders  Procedure(s) Performed: INTRAMEDULLARY (IM) NAIL TIBIAL (Right: Leg Upper)  Patient Location: PACU  Anesthesia Type:General  Level of Consciousness: awake and alert   Airway & Oxygen Therapy: Patient Spontanous Breathing and Patient connected to face mask oxygen  Post-op Assessment: Report given to RN, Post -op Vital signs reviewed and stable, and Patient moving all extremities  Post vital signs: Reviewed and stable  Last Vitals:  Vitals Value Taken Time  BP 120/87 03/23/23 1735  Temp    Pulse 96 03/23/23 1736  Resp 24 03/23/23 1736  SpO2 89 % 03/23/23 1736  Vitals shown include unvalidated device data.  Last Pain:  Vitals:   03/23/23 1404  TempSrc:   PainSc: 10-Worst pain ever      Patients Stated Pain Goal: 0 (03/22/23 2333)  Complications: No notable events documented.

## 2023-03-23 NOTE — Anesthesia Preprocedure Evaluation (Addendum)
Anesthesia Evaluation  Patient identified by MRN, date of birth, ID band Patient awake    Reviewed: Allergy & Precautions, H&P , NPO status , Patient's Chart, lab work & pertinent test results  Airway Mallampati: III  TM Distance: <3 FB Neck ROM: Limited    Dental no notable dental hx.    Pulmonary asthma , Current Smoker and Patient abstained from smoking.   Pulmonary exam normal breath sounds clear to auscultation       Cardiovascular negative cardio ROS Normal cardiovascular exam Rhythm:Regular Rate:Normal     Neuro/Psych negative neurological ROS  negative psych ROS   GI/Hepatic Neg liver ROS,GERD  ,,  Endo/Other  negative endocrine ROS    Renal/GU negative Renal ROS  negative genitourinary   Musculoskeletal negative musculoskeletal ROS (+)    Abdominal   Peds negative pediatric ROS (+)  Hematology negative hematology ROS (+)   Anesthesia Other Findings   Reproductive/Obstetrics negative OB ROS                             Anesthesia Physical Anesthesia Plan  ASA: 2  Anesthesia Plan: General   Post-op Pain Management: Ketamine IV*, Toradol IV (intra-op)* and Dilaudid IV   Induction: Intravenous  PONV Risk Score and Plan: 1 and Ondansetron, Dexamethasone and Treatment may vary due to age or medical condition  Airway Management Planned: Oral ETT and Video Laryngoscope Planned  Additional Equipment:   Intra-op Plan:   Post-operative Plan: Extubation in OR  Informed Consent: I have reviewed the patients History and Physical, chart, labs and discussed the procedure including the risks, benefits and alternatives for the proposed anesthesia with the patient or authorized representative who has indicated his/her understanding and acceptance.     Dental advisory given  Plan Discussed with: CRNA and Surgeon  Anesthesia Plan Comments:         Anesthesia Quick  Evaluation

## 2023-03-23 NOTE — Progress Notes (Signed)
Pt noted to have had multiple pain medications geared towards effective pain management of right leg pain, minimal effects noted. Pt noted yelling out intermittently in pain with periods of tearfulness. [employer], [place] notified, new orders noted. Wife at bedside and updated on plan of care as well as pt. Made aware of scheduled surgery time, consent signed. Vital signs stable at this time.

## 2023-03-23 NOTE — Plan of Care (Signed)
  Problem: Education: Goal: Knowledge of General Education information will improve Description Including pain rating scale, medication(s)/side effects and non-pharmacologic comfort measures Outcome: Progressing   Problem: Health Behavior/Discharge Planning: Goal: Ability to manage health-related needs will improve Outcome: Progressing   

## 2023-03-23 NOTE — TOC Initial Note (Signed)
Transition of Care [facility]) - Initial/Assessment Note    Patient Details  Name: Alan Sanders MRN: [id number] Date of Birth: 04-07-1971  Transition of Care [facility]) CM/SW Contact:    Durenda Guthrie, RN Phone Number: 03/23/2023, 11:24 AM  Clinical Narrative:                  Patient is 52 yr old male, s/p fall with Right tib/fib fracture. OR today.    Transition of Care [facility]) Department has reviewed patient and no TOC needs have been identified at this time. We will continue to monitor patient advancement through Interdisciplinary progressions and if new patient needs arise, please place a consult.     Patient Goals and CMS Choice            Expected Discharge Plan and Services                                              Prior Living Arrangements/Services                       Activities of Daily Living Home Assistive Devices/Equipment: Eyeglasses ADL Screening (condition at time of admission) Patient's cognitive ability adequate to safely complete daily activities?: Yes Is the patient deaf or have difficulty hearing?: No Does the patient have difficulty seeing, even when wearing glasses/contacts?: No Does the patient have difficulty concentrating, remembering, or making decisions?: No Patient able to express need for assistance with ADLs?: Yes Does the patient have difficulty dressing or bathing?: Yes Independently performs ADLs?: No Does the patient have difficulty walking or climbing stairs?: Yes Weakness of Legs: Right Weakness of Arms/Hands: None  Permission Sought/Granted                  Emotional Assessment              Admission diagnosis:  Closed right tibial fracture [S82.201A] Closed fracture of right lower extremity, initial encounter [S82.91XA] Patient Active Problem List   Diagnosis Date Noted   Closed right tibial fracture 03/22/2023   Dyspepsia 02/17/2023   Acute gastroenteropathy due to Norovirus 02/13/2023    Chronic diarrhea 02/13/2023   Intractable nausea and vomiting 02/11/2023   Personal history of colonic polyps 01/22/2023   Loss of weight 01/22/2023   Genetic testing 05/19/2022   History of colonic polyps 05/08/2022   Family history of colonic polyps 05/08/2022   Need for shingles vaccine 02/27/2022   Screen for colon cancer 02/27/2022   Strain of lumbar region 02/27/2022   Peyronie's disease 02/27/2022   Pain in both feet 02/27/2022   Work related injury 12/10/2021   Insomnia secondary to depression with anxiety 05/13/2021   Cervical myelopathy (HCC) 04/16/2021   Seasonal allergic rhinitis due to pollen 02/18/2021   Dysthymia 02/18/2021   Cough 02/13/2015   Thrombocytopenia (HCC) 02/13/2015   Chest pain 02/12/2015   Asthma 02/12/2015   Tobacco abuse 02/12/2015   Nausea vomiting and diarrhea 02/12/2015   Pain in the chest    PCP:  Mliss Sax, MD Pharmacy:   [facility] - [facility] [address] [place] [address] Phone: [phone] Fax: [phone]  [facility] [address] [place] [address] Phone: [phone] Fax: [phone]  [id number] [place] [place] - [address] [place] [address] Phone: [phone] Fax: [phone]  [id number] [place], [place] - [address] [place] [address] Phone: [phone] Fax: [phone]  [id number] - [place], [place] - [address] AT [facility] OF [address] [place] [address] Phone: [phone] Fax: [phone]     Social Determinants of Health (SDOH) Social History: SDOH Screenings   Food Insecurity: No Food Insecurity (03/22/2023)  Housing: Low Risk  (03/22/2023)  Transportation Needs: No Transportation Needs (03/22/2023)  Utilities: Not At Risk (03/22/2023)  Depression  (PHQ2-9): High Risk (02/17/2023)  Tobacco Use: High Risk (03/22/2023)   SDOH Interventions:     Readmission Risk Interventions     No data to display

## 2023-03-24 ENCOUNTER — Other Ambulatory Visit (HOSPITAL_COMMUNITY): Payer: Self-pay

## 2023-03-24 DIAGNOSIS — E559 Vitamin D deficiency, unspecified: Secondary | ICD-10-CM | POA: Diagnosis present

## 2023-03-24 DIAGNOSIS — F17200 Nicotine dependence, unspecified, uncomplicated: Secondary | ICD-10-CM | POA: Diagnosis present

## 2023-03-24 LAB — CBC
HCT: 36.8 % — ABNORMAL LOW (ref 39.0–52.0)
Hemoglobin: 12.9 g/dL — ABNORMAL LOW (ref 13.0–17.0)
MCH: 28.3 pg (ref 26.0–34.0)
MCHC: 35.1 g/dL (ref 30.0–36.0)
MCV: 80.7 fL (ref 80.0–100.0)
Platelets: 110 10*3/uL — ABNORMAL LOW (ref 150–400)
RBC: 4.56 MIL/uL (ref 4.22–5.81)
RDW: 15.2 % (ref 11.5–15.5)
WBC: 8.4 10*3/uL (ref 4.0–10.5)
nRBC: 0 % (ref 0.0–0.2)

## 2023-03-24 LAB — COMPREHENSIVE METABOLIC PANEL
ALT: 39 U/L (ref 0–44)
AST: 30 U/L (ref 15–41)
Albumin: 3.8 g/dL (ref 3.5–5.0)
Alkaline Phosphatase: 67 U/L (ref 38–126)
Anion gap: 10 (ref 5–15)
BUN: 10 mg/dL (ref 6–20)
CO2: 27 mmol/L (ref 22–32)
Calcium: 8.8 mg/dL — ABNORMAL LOW (ref 8.9–10.3)
Chloride: 97 mmol/L — ABNORMAL LOW (ref 98–111)
Creatinine, Ser: 0.91 mg/dL (ref 0.61–1.24)
GFR, Estimated: >60 mL/min (ref >60)
Glucose, Bld: 139 mg/dL — ABNORMAL HIGH (ref 70–99)
Potassium: 4.1 mmol/L (ref 3.5–5.1)
Sodium: 134 mmol/L — ABNORMAL LOW (ref 135–145)
Total Bilirubin: 0.6 mg/dL (ref 0.3–1.2)
Total Protein: 6.8 g/dL (ref 6.5–8.1)

## 2023-03-24 LAB — TSH: TSH: 0.569 u[IU]/mL (ref 0.350–4.500)

## 2023-03-24 LAB — HEMOGLOBIN A1C
Hgb A1c MFr Bld: 5 % (ref 4.8–5.6)
Mean Plasma Glucose: 96.8 mg/dL

## 2023-03-24 LAB — PHOSPHORUS: Phosphorus: 3 mg/dL (ref 2.5–4.6)

## 2023-03-24 LAB — MAGNESIUM: Magnesium: 2 mg/dL (ref 1.7–2.4)

## 2023-03-24 MED ORDER — VITAMIN D 25 MCG (1000 UNIT) PO TABS
2000.0000 [IU] | ORAL_TABLET | Freq: Two times a day (BID) | ORAL | Status: DC
  Administered 2023-03-24 – 2023-03-26 (×4): 2000 [IU] via ORAL
  Filled 2023-03-24 (×4): qty 2

## 2023-03-24 MED ORDER — ADULT MULTIVITAMIN W/MINERALS CH
1.0000 | ORAL_TABLET | Freq: Every day | ORAL | Status: DC
  Administered 2023-03-24 – 2023-03-26 (×3): 1 via ORAL
  Filled 2023-03-24 (×3): qty 1

## 2023-03-24 MED ORDER — CALCIUM CITRATE 950 (200 CA) MG PO TABS
200.0000 mg | ORAL_TABLET | Freq: Two times a day (BID) | ORAL | Status: DC
  Administered 2023-03-24 – 2023-03-26 (×4): 200 mg via ORAL
  Filled 2023-03-24 (×4): qty 1

## 2023-03-24 MED ORDER — HYDROCODONE-ACETAMINOPHEN 7.5-325 MG PO TABS
1.0000 | ORAL_TABLET | Freq: Four times a day (QID) | ORAL | Status: DC | PRN
  Administered 2023-03-24 – 2023-03-26 (×6): 2 via ORAL
  Filled 2023-03-24 (×7): qty 2

## 2023-03-24 MED ORDER — MUPIROCIN 2 % EX OINT
1.0000 | TOPICAL_OINTMENT | Freq: Two times a day (BID) | CUTANEOUS | Status: DC
  Administered 2023-03-24 – 2023-03-26 (×4): 1 via NASAL

## 2023-03-24 MED ORDER — PANTOPRAZOLE SODIUM 40 MG PO TBEC
40.0000 mg | DELAYED_RELEASE_TABLET | Freq: Every day | ORAL | Status: DC
  Administered 2023-03-24 – 2023-03-26 (×3): 40 mg via ORAL
  Filled 2023-03-24 (×3): qty 1

## 2023-03-24 MED ORDER — VITAMIN C 500 MG PO TABS
1000.0000 mg | ORAL_TABLET | Freq: Every day | ORAL | Status: DC
  Administered 2023-03-24 – 2023-03-26 (×3): 1000 mg via ORAL
  Filled 2023-03-24 (×3): qty 2

## 2023-03-24 MED ORDER — CHLORHEXIDINE GLUCONATE CLOTH 2 % EX PADS
6.0000 | MEDICATED_PAD | Freq: Every day | CUTANEOUS | Status: DC
  Administered 2023-03-25: 6 via TOPICAL

## 2023-03-24 MED ORDER — FENTANYL CITRATE PF 50 MCG/ML IJ SOSY
25.0000 ug | PREFILLED_SYRINGE | INTRAMUSCULAR | Status: DC | PRN
  Administered 2023-03-24 – 2023-03-26 (×4): 25 ug via INTRAVENOUS
  Filled 2023-03-24 (×4): qty 1

## 2023-03-24 MED ORDER — CEFAZOLIN SODIUM-DEXTROSE 2-4 GM/100ML-% IV SOLN
2.0000 g | Freq: Once | INTRAVENOUS | Status: AC
  Administered 2023-03-24: 2 g via INTRAVENOUS
  Filled 2023-03-24: qty 100

## 2023-03-24 NOTE — Evaluation (Signed)
Physical Therapy Evaluation Patient Details Name: Alan Sanders MRN: [id number] DOB: January 07, 1971 Today's Date: 03/24/2023  History of Present Illness  Alan Sanders is a 52 y/o male admitted after fall 4/28 with Right tib/fib fracture. S/p IM nail 03/23/23.  PMH includes Anxiety, Arthritis, Asthma, Gait difficulty, Seizures, Thrombocytopenia, Tobacco abuse, Sural nerve bx (Right, 12/11/2020); Anterior cervical decomp/discectomy fusion (04/16/2021).  Clinical Impression  Patient is s/p above surgery resulting in functional limitations due to the deficits listed below (see PT Problem List). Independent PTA with reported falls and hx of neuropathy. Wife present and very supportive. Pt required up to min guard to ambulate safely with a rolling walker, showing some instability and erratic control but without need for physical assist today. Distance limited by onset of nausea and dry heaving episodes, RN provided anti-emetic during session. Has a ramp to enter home, no stairs. Was able to maintain TDWB throughout session. Will continue to progress as tolerated during admission. Patient will benefit from acute skilled PT to increase their independence and safety with mobility to facilitate discharge.        Recommendations for follow up therapy are one component of a multi-disciplinary discharge planning process, led by the attending physician.  Recommendations may be updated based on patient status, additional functional criteria and insurance authorization.     Assistance Recommended at Discharge Intermittent Supervision/Assistance  Patient can return home with the following  A little help with walking and/or transfers;A little help with bathing/dressing/bathroom;Assistance with cooking/housework;Assist for transportation;Help with stairs or ramp for entrance    Equipment Recommendations Rolling walker (2 wheels)     Functional Status Assessment Patient has had a recent decline in their functional  status and demonstrates the ability to make significant improvements in function in a reasonable and predictable amount of time.     Precautions / Restrictions Precautions Precautions: Fall Required Braces or Orthoses: Other Brace Other Brace: cam boot Restrictions Weight Bearing Restrictions: Yes RLE Weight Bearing: Touchdown weight bearing (Clarified with [place], [place] on 4/30  via secure chat. TDWB for a couple of weeks, he will be advanced at ortho follow-up.)      Mobility  Bed Mobility Overal bed mobility: Modified Independent             General bed mobility comments: increased time    Transfers Overall transfer level: Needs assistance Equipment used: Rolling walker (2 wheels) Transfers: Sit to/from Stand Sit to Stand: Min guard           General transfer comment: Min guard for safety performed from recliner, bed, and BSC. Cues for safety, technique, and hand placement. Maintains TDWB on RLE with CAM boot in place. Mild unsteadiness.    Ambulation/Gait Ambulation/Gait assistance: Min guard Gait Distance (Feet): 55 Feet (+15) Assistive device: Rolling walker (2 wheels) Gait Pattern/deviations: Step-to pattern, Decreased stride length, Decreased stance time - right, Decreased weight shift to right, Antalgic Gait velocity: decr Gait velocity interpretation: <1.31 ft/sec, indicative of household ambulator   General Gait Details: Educated on appropriate AD use with RW to maintain TDWB on RLE throughout therapy session. Ambulate to bathroom, followed by short distance in hallway with close guard for safety. Somewhat erratic control with RW. Cues for safety, sequencing, and awareness. Maintains TDWB. Became nauseous, dry heaving, limiting further distance covered.  Stairs            Wheelchair Mobility    Modified Rankin (Stroke Patients Only)       Balance Overall balance assessment: Needs assistance Sitting-balance  support: Feet supported, No upper  extremity supported Sitting balance-Leahy Scale: Good     Standing balance support: Single extremity supported, During functional activity, Reliant on assistive device for balance Standing balance-Leahy Scale: Poor                               Pertinent Vitals/Pain Pain Assessment Pain Assessment: Faces Faces Pain Scale: Hurts even more Pain Location: R knee Pain Descriptors / Indicators: Discomfort, Guarding, Grimacing, Sore Pain Intervention(s): Monitored during session, Repositioned, Ice applied    Home Living Family/patient expects to be discharged to:: Private residence Living Arrangements: Spouse/significant other;Children (20 y/o son) Available Help at Discharge: Family Type of Home: Mobile home Home Access: Ramped entrance       Home Layout: One level Home Equipment: Shower seat;Grab bars - tub/shower Additional Comments: 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    Prior Function Prior Level of Function : Independent/Modified Independent;Working/employed;Driving;History of Falls (last six months)             Mobility Comments: no DME at baseline ADLs Comments: independent, assists with cooking but not cleaning     Hand Dominance   Dominant Hand: Right    Extremity/Trunk Assessment   Upper Extremity Assessment Upper Extremity Assessment: Defer to OT evaluation    Lower Extremity Assessment Lower Extremity Assessment: RLE deficits/detail RLE Deficits / Details: deficits as anticipated post-op RLE Sensation: history of peripheral neuropathy RLE Coordination: decreased gross motor    Cervical / Trunk Assessment Cervical / Trunk Assessment:  (history of cervical surgery in 2022)  Communication   Communication: No difficulties  Cognition Arousal/Alertness: Awake/alert Behavior During Therapy: WFL for tasks assessed/performed Overall Cognitive Status: Within Functional Limits for tasks assessed                                           General Comments General comments (skin integrity, edema, etc.): VSS throughout. Educated on positioning for RLE    Exercises General Exercises - Lower Extremity Quad Sets: Strengthening, Both, 10 reps, Seated Heel Slides: AROM, Right, 10 reps, Supine   Assessment/Plan    PT Assessment Patient needs continued PT services  PT Problem List Decreased range of motion;Decreased strength;Decreased activity tolerance;Decreased balance;Decreased mobility;Decreased knowledge of use of DME;Decreased knowledge of precautions;Pain;Obesity       PT Treatment Interventions DME instruction;Gait training;Functional mobility training;Therapeutic activities;Therapeutic exercise;Balance training;Neuromuscular re-education;Patient/family education;Modalities    PT Goals (Current goals can be found in the Care Plan section)  Acute Rehab PT Goals Patient Stated Goal: Get well PT Goal Formulation: With patient Time For Goal Achievement: 03/31/23 Potential to Achieve Goals: Good    Frequency Min 5X/week     Co-evaluation               AM-PAC PT "6 Clicks" Mobility  Outcome Measure Help needed turning from your back to your side while in a flat bed without using bedrails?: None Help needed moving from lying on your back to sitting on the side of a flat bed without using bedrails?: None Help needed moving to and from a bed to a chair (including a wheelchair)?: A Little Help needed standing up from a chair using your arms (e.g., wheelchair or bedside chair)?: A Little Help needed to walk in hospital room?: A Little Help needed climbing 3-5 steps with a  railing? : A Lot 6 Click Score: 19    End of Session Equipment Utilized During Treatment: Gait belt;Other (comment) (CAM boot Rt) Activity Tolerance: Patient tolerated treatment well Patient left: in bed;with call bell/phone within reach;with bed alarm set;with family/visitor present;with SCD's reapplied Nurse Communication:  Mobility status PT Visit Diagnosis: Unsteadiness on feet (R26.81);Other abnormalities of gait and mobility (R26.89);Repeated falls (R29.6);History of falling (Z91.81);Difficulty in walking, not elsewhere classified (R26.2);Pain Pain - Right/Left: Right Pain - part of body: Leg;Knee    Time: 1030-1110 PT Time Calculation (min) (ACUTE ONLY): 40 min   Charges:   PT Evaluation $PT Eval Low Complexity: 1 Low PT Treatments $Gait Training: 8-22 mins $Therapeutic Activity: 8-22 mins        Kathlyn Sacramento, PT, DPT Physical Therapist Acute Rehabilitation Services [facility] [phone]   Berton Mount 03/24/2023, 12:20 PM

## 2023-03-24 NOTE — TOC Initial Note (Signed)
Transition of Care [facility]) - Initial/Assessment Note    Patient Details  Name: Alan Sanders MRN: [id number] Date of Birth: 06/19/1971  [facility]) CM/SW Contact:    Lawerance Sabal, RN Phone Number: 03/24/2023, 1:49 PM  Clinical Narrative:                  Sherron Monday w patient over the phone to discuss needs for DC. Patient originally asking about outpatient services, explained to him that home health services will come out to his house making the transition easier for him as he has been having pain controll issues with PT sessions as inpatient.  Discussed DME needs and he will need RW and BSC, these have been ordered to his room through [employer].  Anticipate DC tomorrow if he progresses well with PT Expected Discharge Plan: Home w Home Health Services Barriers to Discharge: Continued Medical Work up   Patient Goals and CMS Choice Patient states their goals for this hospitalization and ongoing recovery are:: to go home          Expected Discharge Plan and Services   Discharge Planning Services: CM Consult Post Acute Care Choice: Home Health, Durable Medical Equipment Living arrangements for the past 2 months: Single Family Home                 DME Arranged: Bedside commode, Walker rolling DME Agency: [employer] Date DME Agency Contacted: 03/24/23 Time DME Agency Contacted: 1348 Representative spoke with at DME Agency: Vaughan Basta HH Arranged: PT HH Agency: [facility] Date [facility] Agency Contacted: 03/24/23 Time HH Agency Contacted: 1348 Representative spoke with at [facility] Agency: Amy  Prior Living Arrangements/Services Living arrangements for the past 2 months: Single Family Home Lives with:: Spouse                   Activities of Daily Living Home Assistive Devices/Equipment: Eyeglasses ADL Screening (condition at time of admission) Patient's cognitive ability adequate to safely complete daily activities?: Yes Is the patient deaf or have  difficulty hearing?: No Does the patient have difficulty seeing, even when wearing glasses/contacts?: No Does the patient have difficulty concentrating, remembering, or making decisions?: No Patient able to express need for assistance with ADLs?: Yes Does the patient have difficulty dressing or bathing?: Yes Independently performs ADLs?: No Does the patient have difficulty walking or climbing stairs?: Yes Weakness of Legs: Right Weakness of Arms/Hands: None  Permission Sought/Granted                  Emotional Assessment              Admission diagnosis:  Closed right tibial fracture [S82.201A] Closed fracture of right lower extremity, initial encounter [S82.91XA] Patient Active Problem List   Diagnosis Date Noted   Closed right tibial fracture 03/22/2023   Dyspepsia 02/17/2023   Acute gastroenteropathy due to Norovirus 02/13/2023   Chronic diarrhea 02/13/2023   Intractable nausea and vomiting 02/11/2023   Personal history of colonic polyps 01/22/2023   Loss of weight 01/22/2023   Genetic testing 05/19/2022   History of colonic polyps 05/08/2022   Family history of colonic polyps 05/08/2022   Need for shingles vaccine 02/27/2022   Screen for colon cancer 02/27/2022   Strain of lumbar region 02/27/2022   Peyronie's disease 02/27/2022   Pain in both feet 02/27/2022   Work related injury 12/10/2021   Insomnia secondary to depression with anxiety 05/13/2021   Cervical myelopathy (HCC) 04/16/2021  Seasonal allergic rhinitis due to pollen 02/18/2021   Dysthymia 02/18/2021   Cough 02/13/2015   Thrombocytopenia (HCC) 02/13/2015   Chest pain 02/12/2015   Asthma 02/12/2015   Tobacco abuse 02/12/2015   Nausea vomiting and diarrhea 02/12/2015   Pain in the chest    PCP:  Mliss Sax, MD Pharmacy:   [facility] [address] [place] [address] Phone: [phone] Fax: [phone]  [facility] [address] [place] [address] Phone: [phone] Fax: [phone]  [id number] [place], [place] - [address] [place] [address] Phone: [phone] Fax: [phone]  [id number] [place],  - [address] [place] [address] Phone: [phone] Fax: [phone]  [id number] - [place], [place] - [address] AT [facility] OF [address] [place] [address] Phone: [phone] Fax: [phone]     Social Determinants of Health (SDOH) Social History: SDOH Screenings   Food Insecurity: No Food Insecurity (03/22/2023)  Housing: Low Risk  (03/22/2023)  Transportation Needs: No Transportation Needs (03/22/2023)  Utilities: Not At Risk (03/22/2023)  Depression (PHQ2-9): High Risk (02/17/2023)  Tobacco Use: High Risk (03/23/2023)   SDOH Interventions:     Readmission Risk Interventions     No data to display

## 2023-03-24 NOTE — Plan of Care (Signed)

## 2023-03-24 NOTE — Evaluation (Signed)
Occupational Therapy Evaluation Patient Details Name: Alan Sanders MRN: [id number] DOB: 05/31/1971 Today's Date: 03/24/2023   History of Present Illness Alan Sanders is a 52 y/o male admitted after fall with Right tib/fib fracture. S/p IM nail 03/23/23.  PMH includes Anxiety, Arthritis, Asthma, Gait difficulty, Seizures, Thrombocytopenia, Tobacco abuse, Sural nerve bx (Right, 12/11/2020); Anterior cervical decomp/discectomy fusion (04/16/2021).   Clinical Impression   Pt is typically independent in ADL and mobility with hx of falls. 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 Today he is min guard assist for transfers with RW. He is able to perform figure 4 to access LLE feet for ADL. His wife or adult son will be around to assist as needed. He does require seated position for bathing/dressing/grooming tasks. AT this time recommending 3 in 1 for patient to assist with toilet transfers as he does not have a surface to push up from and would benefit greatly from arm rests to assist with transfers. Wife present throughout session and supportive. VSS throughout session. OT will follow acutely - but do not anticipate post-acute OT needs at this time.      Recommendations for follow up therapy are one component of a multi-disciplinary discharge planning process, led by the attending physician.  Recommendations may be updated based on patient status, additional functional criteria and insurance authorization.   Assistance Recommended at Discharge PRN  Patient can return home with the following A little help with walking and/or transfers;A little help with bathing/dressing/bathroom;Assistance with cooking/housework;Assist for transportation;Help with stairs or ramp for entrance    Functional Status Assessment  Patient has had a recent decline in their functional status and demonstrates the ability to make significant improvements in function in a reasonable and predictable amount of time.  Equipment  Recommendations  BSC/3in1    Recommendations for Other Services PT consult     Precautions / Restrictions Precautions Precautions: Fall Required Braces or Orthoses: Other Brace Other Brace: cam boot Restrictions Weight Bearing Restrictions: Yes RLE Weight Bearing: Weight bearing as tolerated      Mobility Bed Mobility Overal bed mobility: Modified Independent             General bed mobility comments: increased time    Transfers Overall transfer level: Needs assistance Equipment used: Rolling walker (2 wheels) Transfers: Sit to/from Stand, Bed to chair/wheelchair/BSC Sit to Stand: Min assist     Step pivot transfers: Min assist     General transfer comment: min A for balance and initial education of hand placement      Balance Overall balance assessment: Needs assistance Sitting-balance support: Feet supported, No upper extremity supported Sitting balance-Leahy Scale: Good     Standing balance support: Bilateral upper extremity supported, During functional activity, Reliant on assistive device for balance Standing balance-Leahy Scale: Poor                             ADL either performed or assessed with clinical judgement   ADL Overall ADL's : Needs assistance/impaired Eating/Feeding: Modified independent   Grooming: Wash/dry face;Set up;Sitting Grooming Details (indicate cue type and reason): seated position Upper Body Bathing: Set up;Sitting   Lower Body Bathing: Set up;Sitting/lateral leans Lower Body Bathing Details (indicate cue type and reason): from shower chair Upper Body Dressing : Set up;Sitting   Lower Body Dressing: Moderate assistance;Sit to/from stand;With caregiver independent assisting Lower Body Dressing Details (indicate cue type and reason): educated in sequence, able to perform on L,  assist for R Toilet Transfer: Min guard;Ambulation;Rolling walker (2 wheels)   Toileting- Clothing Manipulation and Hygiene: Minimal  assistance;Sit to/from stand   Tub/ Shower Transfer: Min guard;Ambulation;Shower seat;Rolling walker (2 wheels);Grab bars Tub/Shower Transfer Details (indicate cue type and reason): walk in shower Functional mobility during ADLs: Min guard;Rolling walker (2 wheels) General ADL Comments: imapcted by pain, but Pt is very tough and pushes through, able to address UB and LB ADL - will have assist from wife or adult son as needed. requires seated position for safety with ADL     Vision Patient Visual Report: No change from baseline Vision Assessment?: No apparent visual deficits     Perception     Praxis      Pertinent Vitals/Pain Pain Assessment Pain Assessment: 0-10 Pain Score: 10-Worst pain ever Pain Location: R knee Pain Descriptors / Indicators: Discomfort, Guarding, Grimacing, Sore Pain Intervention(s): Monitored during session, Repositioned, Patient requesting pain meds-RN notified (elevation)     Hand Dominance Right   Extremity/Trunk Assessment Upper Extremity Assessment Upper Extremity Assessment: Overall WFL for tasks assessed   Lower Extremity Assessment Lower Extremity Assessment: RLE deficits/detail RLE Deficits / Details: deficits as anticipated post-op RLE Coordination: decreased gross motor   Cervical / Trunk Assessment Cervical / Trunk Assessment: Neck Surgery (history of in 2022)   Communication Communication Communication: No difficulties   Cognition Arousal/Alertness: Awake/alert Behavior During Therapy: WFL for tasks assessed/performed Overall Cognitive Status: Within Functional Limits for tasks assessed                                       General Comments  VSS throughout session. 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      Prior Functioning/Environment Prior Level of Function : Independent/Modified Independent;Working/employed;Driving;History of Falls (last six months)             Mobility Comments: no DME at baseline ADLs Comments: independent, assists with cooking but not cleaning        OT Problem List: Decreased range of motion;Decreased activity tolerance;Impaired balance (sitting and/or standing);Decreased knowledge of use of DME or AE;Decreased knowledge of precautions;Pain      OT Treatment/Interventions: Self-care/ADL training;DME and/or AE instruction;Therapeutic activities;Patient/family education;Balance training    OT Goals(Current goals can be found in the care plan section) Acute Rehab OT Goals Patient Stated Goal: get better, keep walking OT Goal Formulation: With patient/family Time For Goal Achievement: 04/07/23 Potential to Achieve Goals: Good ADL Goals Pt Will Perform Grooming: with modified independence;sitting Pt Will Perform Lower Body Bathing: with modified independence;sitting/lateral leans Pt Will Perform Upper Body Dressing: with modified independence;sitting Pt Will Perform Lower Body Dressing: with supervision;sit to/from stand;with caregiver independent in assisting Pt Will Transfer to Toilet: with modified independence;ambulating Pt Will Perform Toileting - Clothing Manipulation and hygiene: with modified independence;sitting/lateral leans Pt Will Perform Tub/Shower Transfer: Shower transfer;with supervision;with caregiver independent in assisting;shower  seat  OT Frequency: Min 2X/week    Co-evaluation              AM-PAC OT "6 Clicks" Daily Activity      Outcome Measure Help from another person eating meals?: None Help from another person taking care of personal grooming?: A Little Help from another person toileting, which includes using toliet, bedpan, or urinal?: A Little Help from another person bathing (including washing, rinsing, drying)?: A Little Help from another person to put on and taking off regular upper body clothing?: None Help from another person to put on and taking off regular lower body clothing?: A Lot 6 Click Score: 19   End of Session Equipment Utilized During Treatment: Gait belt;Rolling walker (2 wheels);Other (comment) (CAM boot) Nurse Communication: Mobility status;Patient requests pain meds;Weight bearing status  Activity Tolerance: Patient tolerated treatment well Patient left: in chair;with call bell/phone within reach;with family/visitor present  OT Visit Diagnosis: Unsteadiness on feet (R26.81);Other abnormalities of gait and mobility (R26.89);Muscle weakness (generalized) (M62.81);Pain Pain - Right/Left: Right Pain - part of body: Leg;Knee;Ankle and joints of foot                Time: 1610-9604 OT Time Calculation (min): 33 min Charges:  OT General Charges $OT Visit: 1 Visit OT Evaluation $OT Eval Moderate Complexity: 1 Mod OT Treatments $Self Care/Home Management : 8-22 mins  Nyoka Cowden OTR/L Acute Rehabilitation Services Office: [phone]  [facility] 03/24/2023, 9:57 AM

## 2023-03-24 NOTE — Progress Notes (Signed)
Orthopaedic Trauma Service Progress Note  Patient ID: Alan Sanders MRN: [id number] DOB/AGE: 04-13-1971 52 y.o.  Subjective:  Moderate pain R leg  Feels the oxycodone, nor morphine, are not helping at all  No other complaints   Good appetite  + void + flatus   ROS As above  Objective:   VITALS:   Vitals:   03/24/23 0128 03/24/23 0423 03/24/23 0715 03/24/23 1501  BP: 113/71 123/86  100/62  Pulse: 85 82  90  Resp: (!) 21 19  18   Temp: 98.3 F (36.8 C) 98.1 F (36.7 C) 98 F (36.7 C)   TempSrc: Oral Oral Oral   SpO2: 100%   92%  Weight:      Height:        Estimated body mass index is 28.37 kg/m as calculated from the following:   Height as of this encounter: 6\' 1"  (1.854 m).   Weight as of this encounter: 97.5 kg.   Intake/Output      04/29 0701 04/30 0700 04/30 0701 05/01 0700   P.O.  240   I.V. (mL/kg) 600 (6.2)    Total Intake(mL/kg) 600 (6.2) 240 (2.5)   Urine (mL/kg/hr) 1400 (0.6)    Emesis/NG output     Total Output 1400    Net -800 +240          LABS  Results for orders placed or performed during the hospital encounter of 03/22/23 (from the past 24 hour(s))  Hemoglobin A1c     Status: None   Collection Time: 03/24/23  4:52 AM  Result Value Ref Range   Hgb A1c MFr Bld 5.0 4.8 - 5.6 %   Mean Plasma Glucose 96.8 mg/dL  TSH     Status: None   Collection Time: 03/24/23  4:52 AM  Result Value Ref Range   TSH 0.569 0.350 - 4.500 uIU/mL  Magnesium     Status: None   Collection Time: 03/24/23  4:52 AM  Result Value Ref Range   Magnesium 2.0 1.7 - 2.4 mg/dL  Phosphorus     Status: None   Collection Time: 03/24/23  4:52 AM  Result Value Ref Range   Phosphorus 3.0 2.5 - 4.6 mg/dL  Comprehensive metabolic panel     Status: Abnormal   Collection Time: 03/24/23  4:52 AM  Result Value Ref Range   Sodium 134 (L) 135 - 145 mmol/L   Potassium 4.1 3.5 - 5.1 mmol/L   Chloride  97 (L) 98 - 111 mmol/L   CO2 27 22 - 32 mmol/L   Glucose, Bld 139 (H) 70 - 99 mg/dL   BUN 10 6 - 20 mg/dL   Creatinine, Ser 4.09 0.61 - 1.24 mg/dL   Calcium 8.8 (L) 8.9 - 10.3 mg/dL   Total Protein 6.8 6.5 - 8.1 g/dL   Albumin 3.8 3.5 - 5.0 g/dL   AST 30 15 - 41 U/L   ALT 39 0 - 44 U/L   Alkaline Phosphatase 67 38 - 126 U/L   Total Bilirubin 0.6 0.3 - 1.2 mg/dL   GFR, Estimated >81 >19 mL/min   Anion gap 10 5 - 15  CBC     Status: Abnormal   Collection Time: 03/24/23  4:52 AM  Result Value Ref Range   WBC 8.4 4.0 - 10.5 K/uL  RBC 4.56 4.22 - 5.81 MIL/uL   Hemoglobin 12.9 (L) 13.0 - 17.0 g/dL   HCT 16.1 (L) 09.6 - 04.5 %   MCV 80.7 80.0 - 100.0 fL   MCH 28.3 26.0 - 34.0 pg   MCHC 35.1 30.0 - 36.0 g/dL   RDW 40.9 81.1 - 91.4 %   Platelets 110 (L) 150 - 400 K/uL   nRBC 0.0 0.0 - 0.2 %     PHYSICAL EXAM:   Gen: sitting up in bed, NAD, pleasant  Lungs: unlabored, clear anterior fields  Cardiac: RRR Abd: + BS, NT Ext:       Right Lower Extremity   Cam boot is in place  Dressing is clean, dry and intact  Extremity is warm  Minimal swelling  No pain out of proportion with passive stretching of his toes  + DP pulse  Distal motor and sensory functions are grossly intact    Assessment/Plan: 1 Day Post-Op   Principal Problem:   Closed right tibial fracture   Anti-infectives (From admission, onward)    Start     Dose/Rate Route Frequency Ordered Stop   03/24/23 1315  ceFAZolin (ANCEF) IVPB 2g/100 mL premix        2 g 200 mL/hr over 30 Minutes Intravenous  Once 03/24/23 1223 03/24/23 1310   03/23/23 1900  ceFAZolin (ANCEF) IVPB 1 g/50 mL premix        1 g 100 mL/hr over 30 Minutes Intravenous Every 6 hours 03/23/23 1813 03/24/23 1259   03/23/23 1338  ceFAZolin (ANCEF) 2-4 GM/100ML-% IVPB  Status:  Discontinued       Note to Pharmacy: [facility], [place]: cabinet override      03/23/23 1338 03/23/23 1532   03/23/23 1000  ceFAZolin (ANCEF) IVPB 2g/100 mL premix        2  g 200 mL/hr over 30 Minutes Intravenous To Short Stay 03/22/23 2308 03/23/23 1600   03/23/23 0945  vancomycin (VANCOCIN) IVPB 1000 mg/200 mL premix        1,000 mg 200 mL/hr over 60 Minutes Intravenous On call to O.R. 03/23/23 0848 03/23/23 1830     .  POD/HD#: 1  52 y/o male s/p fall with R tibia/fibula fracture   -Right distal third tibial shaft fracture s/p IMN  Right fibular shaft fracture-nonoperative treatment Stress stable ankle under fluoroscopy  Weightbearing Touchdown weightbearing right leg with cam boot and crutches or walker To advance weightbearing in about 4 weeks   ROM/Activity   Unrestricted range of motion of right ankle and knee   Slowly increase activity level   Wound care   Dressing changes starting tomorrow   PT and OT  Ice and elevation  - Pain management:  Will discontinue his Percocet and morphine  Start Norco and IV fentanyl (reported intolerance to Dilaudid)   Multimodal  - ABL anemia/Hemodynamics  Stable  - Medical issues   Home meds - DVT/PE prophylaxis:  Lovenox  Will discharge on a DOAC for 30 days - ID:   Perioperative antibiotics.  Also received preoperative vancomycin due to positive MRSA PCR - Metabolic Bone Disease:  Vitamin D deficiency   Supplement  Metabolic bone labs pending  - Activity:  As above - FEN/GI prophylaxis/Foley/Lines:  Regular diet  - Impediments to fracture healing:  Vitamin D deficiency  Low-energy fracture  Excessive caffeine intake  Nicotine dependence - Dispo:  Continue with inpatient care  Optimize pain management  DME  Anticipate discharge home tomorrow    Mellody Dance  Clarene Critchley, PA-C [phone] (C) 03/24/2023, 3:48 PM  [facility] [address] [place] [address] [phone] [phone] (F)    After 5pm and on the weekends please log on to Amion, go to orthopaedics and the look under the [facility] Call for the provider(s) on call. You can  also call our office at [phone] and then follow the prompts to be connected to the call team.  Patient ID: Alan Sanders, male   DOB: 06-Mar-1971, 52 y.o.   MRN: [id number]

## 2023-03-25 LAB — CALCITRIOL (1,25 DI-OH VIT D): Vit D, 1,25-Dihydroxy: 69.7 pg/mL (ref 24.8–81.5)

## 2023-03-25 LAB — PARATHYROID HORMONE, INTACT (NO CA): PTH: 25 pg/mL (ref 15–65)

## 2023-03-25 NOTE — Progress Notes (Signed)
Physical Therapy Treatment Patient Details Name: Alan Sanders MRN: [id number] DOB: 06-Nov-1971 Today's Date: 03/25/2023   History of Present Illness Alan Sanders is a 52 y/o male admitted after fall 4/28 with Right tib/fib fracture. S/p IM nail 03/23/23.  PMH includes Anxiety, Arthritis, Asthma, Gait difficulty, Seizures, Thrombocytopenia, Tobacco abuse, Sural nerve bx (Right, 12/11/2020); Anterior cervical decomp/discectomy fusion (04/16/2021).    PT Comments    Pt was seen for progression of visit, but is struggling with maintaining TDWB today more so than sounds in yesterday's documentation.  He is advised to do NWB for now, shorter trip to chair and progressed with exercises.  Ice applied to knee, and asked pt not to try to open up his bandage as he was continually attempting.  Nursing and MD notified.  Ice provided for pain management and will follow along with him to work toward home as MD determines.  Follow along with him for goals of PT.   Recommendations for follow up therapy are one component of a multi-disciplinary discharge planning process, led by the attending physician.  Recommendations may be updated based on patient status, additional functional criteria and insurance authorization.  Follow Up Recommendations       Assistance Recommended at Discharge Intermittent Supervision/Assistance  Patient can return home with the following A little help with walking and/or transfers;A little help with bathing/dressing/bathroom;Assistance with cooking/housework;Assist for transportation;Help with stairs or ramp for entrance   Equipment Recommendations  Rolling walker (2 wheels)    Recommendations for Other Services       Precautions / Restrictions Precautions Precautions: Fall Precaution Comments: TDWB but should maintain NWB due to his difficulty with TDWB Required Braces or Orthoses: Other Brace Other Brace: cam boot Restrictions Weight Bearing Restrictions: Yes RLE Weight  Bearing: Touchdown weight bearing     Mobility  Bed Mobility Overal bed mobility: Needs Assistance Bed Mobility: Supine to Sit     Supine to sit: Min guard     General bed mobility comments: increased time    Transfers Overall transfer level: Needs assistance Equipment used: Rolling walker (2 wheels) Transfers: Sit to/from Stand Sit to Stand: Min guard (pt is overdoing wb on RLE)           General transfer comment: pt had to stop with standing and return to sit to repractice the TDWB.  Reverted to NWB for safety but transitions are a struggle for him    Ambulation/Gait Ambulation/Gait assistance: Min guard Gait Distance (Feet): 8 Feet Assistive device: Rolling walker (2 wheels)   Gait velocity: decr Gait velocity interpretation: <1.31 ft/sec, indicative of household ambulator Pre-gait activities: Standing wb permission practice General Gait Details: Pt was asked to reduce to NWB on RLE due to overdoing the pressure on RLE, despite cues.  Sat in chair due to feeling bad and a bit nauseated, cleared with sitting but struggling with TDWB today   Stairs             Wheelchair Mobility    Modified Rankin (Stroke Patients Only)       Balance Overall balance assessment: Needs assistance Sitting-balance support: Feet supported Sitting balance-Leahy Scale: Good     Standing balance support: Bilateral upper extremity supported, During functional activity Standing balance-Leahy Scale: Poor Standing balance comment: struggling with NWB to TDWB unless cued                            Cognition Arousal/Alertness: Lethargic Behavior During Therapy:  Impulsive Overall Cognitive Status: Difficult to assess                                 General Comments: pt may be struggling due to anesthesia and meds        Exercises General Exercises - Lower Extremity Ankle Circles/Pumps: AROM, AAROM, 5 reps Quad Sets: AROM, 10 reps Gluteal Sets:  AROM, 10 reps Hip ABduction/ADduction: AAROM, AROM, 10 reps    General Comments General comments (skin integrity, edema, etc.): practiced transfers and exercises with pt to decrease his tendency to wb or hit the R heel in boot.  Pt is getting up to stand with PT supporting R foot initially then did chair stands with NWB maintained      Pertinent Vitals/Pain Pain Assessment Pain Assessment: Faces Faces Pain Scale: Hurts even more Pain Location: R knee Pain Descriptors / Indicators: Discomfort, Guarding, Grimacing, Sore Pain Intervention(s): Limited activity within patient's tolerance, Monitored during session, Repositioned, Premedicated before session    Home Living                          Prior Function            PT Goals (current goals can now be found in the care plan section) Acute Rehab PT Goals Patient Stated Goal: get stronger and walk    Frequency    Min 5X/week      PT Plan Current plan remains appropriate    Co-evaluation              AM-PAC PT "6 Clicks" Mobility   Outcome Measure  Help needed turning from your back to your side while in a flat bed without using bedrails?: A Little Help needed moving from lying on your back to sitting on the side of a flat bed without using bedrails?: A Little Help needed moving to and from a bed to a chair (including a wheelchair)?: A Little Help needed standing up from a chair using your arms (e.g., wheelchair or bedside chair)?: A Little Help needed to walk in hospital room?: A Little Help needed climbing 3-5 steps with a railing? : A Lot 6 Click Score: 17    End of Session Equipment Utilized During Treatment: Gait belt;Other (comment) Activity Tolerance: Patient tolerated treatment well Patient left: with call bell/phone within reach;with family/visitor present;in chair Nurse Communication: Mobility status PT Visit Diagnosis: Unsteadiness on feet (R26.81);Other abnormalities of gait and mobility  (R26.89);Repeated falls (R29.6);History of falling (Z91.81);Difficulty in walking, not elsewhere classified (R26.2);Pain Pain - Right/Left: Right Pain - part of body: Leg;Knee     Time: 3244-0102 PT Time Calculation (min) (ACUTE ONLY): 31 min  Charges:  $Gait Training: 8-22 mins $Therapeutic Exercise: 8-22 mins       Ivar Drape 03/25/2023, 12:47 PM  Samul Dada, PT PhD Acute Rehab Dept. Number: [facility] [id number] and [facility] [phone]

## 2023-03-25 NOTE — Evaluation (Addendum)
Occupational Therapy progress note  Patient Details Name: Alan Sanders MRN: [id number] DOB: 1971/06/13 Today's Date: 03/25/2023   History of Present Illness Alan Sanders is a 52 y/o male admitted after fall 4/28 with Right tib/fib fracture. S/p IM nail 03/23/23.  PMH includes Anxiety, Arthritis, Asthma, Gait difficulty, Seizures, Thrombocytopenia, Tobacco abuse, Sural nerve bx (Right, 12/11/2020); Anterior cervical decomp/discectomy fusion (04/16/2021).   Clinical Impression   PT progressing slowly toward established OT goals. Pt requiring more cueing for attention to task and problem solving this session, however, of note, pt typically working third shift and drinks multiple energy drinks every work shift. Pt has reported poor sleep and has not had caffeine at time of treatment. Wife present and reports able to reinforce education as well as assist physically at home as needed. Pt performing LB ADL with up to min A for problem solving and shower transfer with min guard. Will continue to follow acutely to optimize safety and independence in ADL and IADL.      Recommendations for follow up therapy are one component of a multi-disciplinary discharge planning process, led by the attending physician.  Recommendations may be updated based on patient status, additional functional criteria and insurance authorization.   Assistance Recommended at Discharge PRN  Patient can return home with the following A little help with walking and/or transfers;A little help with bathing/dressing/bathroom;Assistance with cooking/housework;Assist for transportation;Help with stairs or ramp for entrance    Functional Status Assessment     Equipment Recommendations  BSC/3in1    Recommendations for Other Services PT consult     Precautions / Restrictions Precautions Precautions: Fall Precaution Comments: TDWB but should maintain NWB due to his difficulty with TDWB Required Braces or Orthoses: Other Brace Other  Brace: cam boot Restrictions Weight Bearing Restrictions: Yes RLE Weight Bearing: Touchdown weight bearing      Mobility Bed Mobility               General bed mobility comments: In recliner on arrival and departure    Transfers Overall transfer level: Needs assistance Equipment used: Rolling walker (2 wheels) Transfers: Sit to/from Stand Sit to Stand: Min guard           General transfer comment: pt had to stop with standing and return to sit to repractice the TDWB.  Reverted to NWB for safety but transitions are a struggle for him. Needs cueing. Opted for practice of shower transfer in front of the chair due to pt poor safety awareness this session      Balance Overall balance assessment: Needs assistance Sitting-balance support: Feet supported Sitting balance-Leahy Scale: Good     Standing balance support: Bilateral upper extremity supported, During functional activity Standing balance-Leahy Scale: Poor Standing balance comment: struggling with NWB to TDWB unless cued                           ADL either performed or assessed with clinical judgement   ADL Overall ADL's : Needs assistance/impaired                     Lower Body Dressing: Minimal assistance;Sit to/from stand Lower Body Dressing Details (indicate cue type and reason): Min A to don/soff R CAM boot. REquires cues for sequencing and problem solving Toilet Transfer: Min guard;Stand-pivot;Rolling walker (2 wheels) Toilet Transfer Details (indicate cue type and reason): Pt wil poor activity tolerance this session     Tub/ Shower Transfer: Min guard;Ambulation;Shower  seat;Rolling walker (2 wheels);Grab bars Tub/Shower Transfer Details (indicate cue type and reason): walk in shower; educated regarding compensatory techniques. Pt will continue to benefit from education acutely Functional mobility during ADLs: Min guard;Rolling walker (2 wheels)       Vision         Perception      Praxis      Pertinent Vitals/Pain Pain Assessment Pain Assessment: Faces Faces Pain Scale: Hurts even more Pain Location: R knee Pain Descriptors / Indicators: Discomfort, Guarding, Grimacing, Sore Pain Intervention(s): Limited activity within patient's tolerance     Hand Dominance     Extremity/Trunk Assessment Upper Extremity Assessment Upper Extremity Assessment: Overall WFL for tasks assessed   Lower Extremity Assessment Lower Extremity Assessment: Defer to PT evaluation       Communication     Cognition Arousal/Alertness: Awake/alert Behavior During Therapy: Impulsive Overall Cognitive Status: Difficult to assess                                 General Comments: pt may be struggling due to anesthesia and meds in addition to change in schedule and thus circadian rhythm as he normally works night shift. Pt very tangential, impulsive, and with poor activity tolerance with frequent premature movement and sitting affecting safety.     General Comments  practiced transfers and exercises with pt to decrease his tendency to wb or hit the R heel in boot.  Pt is getting up to stand with PT supporting R foot initially then did chair stands with NWB maintained    Exercises     Shoulder Instructions      Home Living                                          Prior Functioning/Environment                          OT Problem List:        OT Treatment/Interventions:      OT Goals(Current goals can be found in the care plan section) Acute Rehab OT Goals Patient Stated Goal: get better OT Goal Formulation: With patient/family Time For Goal Achievement: 04/07/23 Potential to Achieve Goals: Good ADL Goals Pt Will Perform Grooming: with modified independence;sitting Pt Will Perform Lower Body Bathing: with modified independence;sitting/lateral leans Pt Will Perform Upper Body Dressing: with modified independence;sitting Pt Will  Perform Lower Body Dressing: with supervision;sit to/from stand;with caregiver independent in assisting Pt Will Transfer to Toilet: with modified independence;ambulating Pt Will Perform Toileting - Clothing Manipulation and hygiene: with modified independence;sitting/lateral leans Pt Will Perform Tub/Shower Transfer: Shower transfer;with supervision;with caregiver independent in assisting;shower seat  OT Frequency: Min 2X/week    Co-evaluation              AM-PAC OT "6 Clicks" Daily Activity     Outcome Measure Help from another person eating meals?: None Help from another person taking care of personal grooming?: A Little Help from another person toileting, which includes using toliet, bedpan, or urinal?: A Little Help from another person bathing (including washing, rinsing, drying)?: A Little Help from another person to put on and taking off regular upper body clothing?: None Help from another person to put on and taking off regular lower body clothing?: A Lot 6  Click Score: 19   End of Session Equipment Utilized During Treatment: Gait belt;Rolling walker (2 wheels);Other (comment) (CAM boot) Nurse Communication: Mobility status;Patient requests pain meds;Weight bearing status  Activity Tolerance: Patient tolerated treatment well Patient left: in chair;with call bell/phone within reach;with family/visitor present  OT Visit Diagnosis: Unsteadiness on feet (R26.81);Other abnormalities of gait and mobility (R26.89);Muscle weakness (generalized) (M62.81);Pain Pain - Right/Left: Right Pain - part of body: Leg;Knee;Ankle and joints of foot                Time: 1914-7829 OT Time Calculation (min): 24 min Charges:  OT General Charges $OT Visit: 1 Visit OT Treatments $Self Care/Home Management : 23-37 mins  Tyler Deis, OTR/L [facility] Acute Rehabilitation Office: [phone]   Myrla Halsted 03/25/2023, 1:48 PM

## 2023-03-25 NOTE — Progress Notes (Signed)
Orthopaedic Trauma Service Progress Note  Patient ID: Alan Sanders MRN: [id number] DOB/AGE: [birth year] 52 y.o.  Subjective:  Doing a little better this am  Pain improved with changed to norco  Still required IV pain meds overnight Wants to work with therapies another day   ROS As above  Objective:   VITALS:   Vitals:   03/24/23 1501 03/24/23 1917 03/25/23 0650 03/25/23 0749  BP: 100/62 (!) 110/97 (!) 109/97 113/68  Pulse: 90 98 74 85  Resp: 18 19 18 20   Temp:  99.6 F (37.6 C) 98.7 F (37.1 C) 98.9 F (37.2 C)  TempSrc:  Oral Oral Oral  SpO2: 92% 95% 94% 90%  Weight:      Height:        Estimated body mass index is 28.37 kg/m as calculated from the following:   Height as of this encounter: 6\' 1"  (1.854 m).   Weight as of this encounter: 97.5 kg.   Intake/Output      04/30 0701 05/01 0700 05/01 0701 05/02 0700   P.O. 240    I.V. (mL/kg)     Total Intake(mL/kg) 240 (2.5)    Urine (mL/kg/hr)     Total Output     Net +240         Urine Occurrence 1 x      LABS  No results found for this or any previous visit (from the past 24 hour(s)).   PHYSICAL EXAM:   Gen: lying in bed, NAD, pleasant  Lungs: unlabored, clear anterior fields  Cardiac: RRR Abd: + BS, NT Ext:       Right Lower Extremity              Cam boot is in place             Dressing is clean, dry and intact             Extremity is warm             Minimal swelling             No pain out of proportion with passive stretching of his toes             + DP pulse             Distal motor and sensory functions are grossly intact  Assessment/Plan: 2 Days Post-Op   Principal Problem:   Closed right tibial fracture Active Problems:   Tobacco abuse   Nicotine dependence   Vitamin D deficiency   Anti-infectives (From admission, onward)    Start     Dose/Rate Route Frequency Ordered Stop   03/24/23 1315   ceFAZolin (ANCEF) IVPB 2g/100 mL premix        2 g 200 mL/hr over 30 Minutes Intravenous  Once 03/24/23 1223 03/24/23 1310   03/23/23 1900  ceFAZolin (ANCEF) IVPB 1 g/50 mL premix        1 g 100 mL/hr over 30 Minutes Intravenous Every 6 hours 03/23/23 1813 03/24/23 1259   03/23/23 1338  ceFAZolin (ANCEF) 2-4 GM/100ML-% IVPB  Status:  Discontinued       Note to Pharmacy: [facility] [place]: cabinet override      03/23/23 1338 03/23/23 1532   03/23/23 1000  ceFAZolin (ANCEF) IVPB 2g/100 mL premix  2 g 200 mL/hr over 30 Minutes Intravenous To Short Stay 03/22/23 2308 03/23/23 1600   03/23/23 0945  vancomycin (VANCOCIN) IVPB 1000 mg/200 mL premix        1,000 mg 200 mL/hr over 60 Minutes Intravenous On call to O.R. 03/23/23 0848 03/23/23 1830     .  POD/HD#: 2  52 y/o male s/p fall with R tibia/fibula fracture    -Right distal third tibial shaft fracture s/p IMN  Right fibular shaft fracture-nonoperative treatment Stress stable ankle under fluoroscopy   Weightbearing Touchdown weightbearing right leg with cam boot and crutches or walker To advance weightbearing in about 4 weeks               ROM/Activity                         Unrestricted range of motion of right ankle and knee                         Slowly increase activity level               Wound care                         Dressing changes starting tomorrow               PT and OT             Ice and elevation   - Pain management:             Will discontinue his Percocet and morphine             Start Norco and IV fentanyl (reported intolerance to Dilaudid)              Multimodal   - ABL anemia/Hemodynamics             Stable   - Medical issues              Home meds - DVT/PE prophylaxis:             Lovenox             Will discharge on a DOAC for 30 days - ID:              Perioperative antibiotics.  Also received preoperative vancomycin due to positive MRSA PCR - Metabolic Bone Disease:              Vitamin D deficiency                         Supplement             Metabolic bone labs pending   - Activity:             As above - FEN/GI prophylaxis/Foley/Lines:             Regular diet   - Impediments to fracture healing:             Vitamin D deficiency             Low-energy fracture             Excessive caffeine intake             Nicotine dependence - Dispo:             Continue with inpatient  care             Optimize pain management             DME             Anticipate discharge home tomorrow     Mearl Latin, PA-C [phone] (C) 03/25/2023, 9:55 AM  [facility] [address] [place] [address] [phone] Collier Bullock (F)    After 5pm and on the weekends please log on to Amion, go to orthopaedics and the look under the [facility] Call for the provider(s) on call. You can also call our office at [phone] and then follow the prompts to be connected to the call team.  Patient ID: Alan Sanders, male   DOB: 19-Jan-1971, 52 y.o.   MRN: [id number]

## 2023-03-26 ENCOUNTER — Other Ambulatory Visit (HOSPITAL_COMMUNITY): Payer: Self-pay

## 2023-03-26 ENCOUNTER — Encounter (HOSPITAL_COMMUNITY): Payer: Self-pay | Admitting: Orthopedic Surgery

## 2023-03-26 DIAGNOSIS — E291 Testicular hypofunction: Secondary | ICD-10-CM | POA: Diagnosis present

## 2023-03-26 HISTORY — DX: Testicular hypofunction: E29.1

## 2023-03-26 LAB — CALCIUM, IONIZED: Calcium, Ionized, Serum: 4.7 mg/dL (ref 4.5–5.6)

## 2023-03-26 LAB — SEX HORMONE BINDING GLOBULIN: Sex Hormone Binding: 26.8 nmol/L (ref 19.3–76.4)

## 2023-03-26 LAB — TESTOSTERONE: Testosterone: 87 ng/dL — ABNORMAL LOW (ref 264–916)

## 2023-03-26 MED ORDER — DOCUSATE SODIUM 100 MG PO CAPS
100.0000 mg | ORAL_CAPSULE | Freq: Two times a day (BID) | ORAL | 0 refills | Status: DC
  Filled 2023-03-26: qty 30, 15d supply, fill #0

## 2023-03-26 MED ORDER — HYDROCODONE-ACETAMINOPHEN 7.5-325 MG PO TABS
1.0000 | ORAL_TABLET | Freq: Four times a day (QID) | ORAL | 0 refills | Status: DC | PRN
  Filled 2023-03-26: qty 50, 7d supply, fill #0

## 2023-03-26 MED ORDER — VITAMIN D (ERGOCALCIFEROL) 1.25 MG (50000 UNIT) PO CAPS
50000.0000 [IU] | ORAL_CAPSULE | ORAL | Status: DC
  Filled 2023-03-26: qty 1

## 2023-03-26 MED ORDER — CALCIUM CARBONATE ANTACID 500 MG PO CHEW
200.0000 mg | CHEWABLE_TABLET | Freq: Two times a day (BID) | ORAL | 1 refills | Status: AC
  Filled 2023-03-26 – 2023-04-27 (×4): qty 60, 30d supply, fill #0

## 2023-03-26 MED ORDER — VITAMIN D (ERGOCALCIFEROL) 1.25 MG (50000 UNIT) PO CAPS
50000.0000 [IU] | ORAL_CAPSULE | ORAL | 1 refills | Status: DC
  Filled 2023-03-26 – 2023-04-28 (×2): qty 5, 35d supply, fill #0

## 2023-03-26 MED ORDER — CHOLECALCIFEROL 125 MCG (5000 UT) PO TABS
1.0000 | ORAL_TABLET | Freq: Every day | ORAL | 6 refills | Status: DC
  Filled 2023-03-26 – 2023-04-18 (×2): qty 30, 30d supply, fill #0
  Filled 2023-07-11: qty 100, 100d supply, fill #0
  Filled 2023-09-19: qty 100, 100d supply, fill #1
  Filled 2023-11-13: qty 80, 80d supply, fill #1

## 2023-03-26 MED ORDER — ASCORBIC ACID 1000 MG PO TABS
1000.0000 mg | ORAL_TABLET | Freq: Every day | ORAL | 1 refills | Status: DC
  Filled 2023-03-26 – 2023-07-11 (×3): qty 30, 30d supply, fill #0

## 2023-03-26 MED ORDER — APIXABAN 2.5 MG PO TABS
2.5000 mg | ORAL_TABLET | Freq: Two times a day (BID) | ORAL | 0 refills | Status: DC
  Filled 2023-03-26: qty 60, 30d supply, fill #0

## 2023-03-26 NOTE — Progress Notes (Signed)
Physical Therapy Treatment Patient Details Name: Alan Sanders MRN: [id number] DOB: Jan 24, 1971 Today's Date: 03/26/2023   History of Present Illness Fardeen Steinberger is a 52 y/o male admitted after fall 4/28 with Right tib/fib fracture. S/p IM nail 03/23/23.  PMH includes Anxiety, Arthritis, Asthma, Gait difficulty, Seizures, Thrombocytopenia, Tobacco abuse, Sural nerve bx (Right, 12/11/2020); Anterior cervical decomp/discectomy fusion (04/16/2021).    PT Comments    Pt admitted with above diagnosis. Pt and wife feel confident with going home today. Wife has gait belt and they report she has been assisting him in room.  Answered all questions for pt and wife.  Wife cues pt appropriately and understands his limitations with TDWB.   Pt currently with functional limitations due to balance and endurance deficits. Pt will benefit from acute skilled PT to increase their independence and safety with mobility to allow discharge.      Recommendations for follow up therapy are one component of a multi-disciplinary discharge planning process, led by the attending physician.  Recommendations may be updated based on patient status, additional functional criteria and insurance authorization.  Follow Up Recommendations       Assistance Recommended at Discharge Intermittent Supervision/Assistance  Patient can return home with the following A little help with walking and/or transfers;A little help with bathing/dressing/bathroom;Assistance with cooking/housework;Assist for transportation;Help with stairs or ramp for entrance   Equipment Recommendations  Rolling walker (2 wheels)    Recommendations for Other Services       Precautions / Restrictions Precautions Precautions: Fall Precaution Comments: TDWB but should maintain NWB due to his difficulty with TDWB Required Braces or Orthoses: Other Brace Other Brace: cam boot Restrictions Weight Bearing Restrictions: Yes RLE Weight Bearing: Touchdown weight  bearing     Mobility  Bed Mobility Overal bed mobility: Needs Assistance Bed Mobility: Supine to Sit     Supine to sit: Supervision          Transfers                   General transfer comment: Pt reports that he and wife had just gotten back from bathroom.  She assisted himfrom bathroom and bed and feels confident with assisting pt at home per pt and wife. Pt stated he is in pain and asking for pain meds.  Notified nurse.    Ambulation/Gait                   Stairs             Wheelchair Mobility    Modified Rankin (Stroke Patients Only)       Balance Overall balance assessment: Needs assistance Sitting-balance support: Feet supported, No upper extremity supported Sitting balance-Leahy Scale: Good                                      Cognition Arousal/Alertness: Awake/alert Behavior During Therapy: Impulsive Overall Cognitive Status: Within Functional Limits for tasks assessed                                          Exercises General Exercises - Lower Extremity Heel Slides: AROM, Right, 10 reps, Supine    General Comments General comments (skin integrity, edema, etc.): Discussed car transfer and how to move seats etc.  Adjusted pts RW.  Pertinent Vitals/Pain Pain Assessment Pain Assessment: Faces Faces Pain Scale: Hurts whole lot Pain Location: R knee Pain Descriptors / Indicators: Discomfort, Guarding, Grimacing, Sore Pain Intervention(s): Limited activity within patient's tolerance, Monitored during session, Repositioned, Patient requesting pain meds-RN notified    Home Living                          Prior Function            PT Goals (current goals can now be found in the care plan section) Progress towards PT goals: Progressing toward goals    Frequency    Min 5X/week      PT Plan Current plan remains appropriate    Co-evaluation              AM-PAC PT  "6 Clicks" Mobility   Outcome Measure  Help needed turning from your back to your side while in a flat bed without using bedrails?: A Little Help needed moving from lying on your back to sitting on the side of a flat bed without using bedrails?: A Little Help needed moving to and from a bed to a chair (including a wheelchair)?: A Little Help needed standing up from a chair using your arms (e.g., wheelchair or bedside chair)?: A Little Help needed to walk in hospital room?: A Little Help needed climbing 3-5 steps with a railing? : A Lot 6 Click Score: 17    End of Session   Activity Tolerance: Patient tolerated treatment well Patient left: with family/visitor present;in bed;with call bell/phone within reach Nurse Communication: Mobility status;Patient requests pain meds PT Visit Diagnosis: Unsteadiness on feet (R26.81);Other abnormalities of gait and mobility (R26.89);Repeated falls (R29.6);History of falling (Z91.81);Difficulty in walking, not elsewhere classified (R26.2);Pain Pain - Right/Left: Right Pain - part of body: Leg;Knee     Time: 1214-1227 PT Time Calculation (min) (ACUTE ONLY): 13 min  Charges:  $Therapeutic Activity: 8-22 mins                     [facility] M,PT Acute Rehab Services [phone]    Bevelyn Buckles 03/26/2023, 2:18 PM

## 2023-03-26 NOTE — Progress Notes (Addendum)
Orthopaedic Trauma Service Progress Note  Patient ID: KERMAN PFOST MRN: [id number] DOB/AGE: 1971-10-24 52 y.o.  Subjective:  Pain tolerable Had 3 cans of dip in his room   Smokes and dips   Wants to go home today    ROS As above  Objective:   VITALS:   Vitals:   03/25/23 1350 03/25/23 1930 03/26/23 0427 03/26/23 0746  BP: 108/69 108/69 (!) 109/58 116/64  Pulse: 68 65 (!) 59 67  Resp: 17 17 17 18   Temp: 98.3 F (36.8 C) 97.9 F (36.6 C) 98 F (36.7 C) 98.4 F (36.9 C)  TempSrc: Oral Oral  Oral  SpO2: 94% 94% 99% 95%  Weight:      Height:        Estimated body mass index is 28.37 kg/m as calculated from the following:   Height as of this encounter: 6\' 1"  (1.854 m).   Weight as of this encounter: 97.5 kg.   Intake/Output      05/01 0701 05/02 0700 05/02 0701 05/03 0700   P.O.     Total Intake(mL/kg)     Net            LABS  Latest Reference Range & Units 03/24/23 04:52  Testosterone 264 - 916 ng/dL 87 (L)  (L): Data is abnormally low  PHYSICAL EXAM:   Gen: sitting up in bed, NAD, pleasant  Lungs: unlabored, clear anterior fields  Cardiac: RRR Abd: + BS, NT Ext:       Right Lower Extremity              cam boot is off             Dressing is clean, dry and intact   Dressings removed   All incisions are dry    No signs of infection   Moderate ecchymosis to ankle and foot              Extremity is warm             Minimal swelling             No pain out of proportion with passive stretching of his toes             + DP pulse             Distal motor and sensory functions are grossly intact  Assessment/Plan: 3 Days Post-Op   Principal Problem:   Closed right tibial fracture Active Problems:   Tobacco abuse   Nicotine dependence   Vitamin D deficiency   Anti-infectives (From admission, onward)    Start     Dose/Rate Route Frequency Ordered Stop   03/24/23  1315  ceFAZolin (ANCEF) IVPB 2g/100 mL premix        2 g 200 mL/hr over 30 Minutes Intravenous  Once 03/24/23 1223 03/24/23 1310   03/23/23 1900  ceFAZolin (ANCEF) IVPB 1 g/50 mL premix        1 g 100 mL/hr over 30 Minutes Intravenous Every 6 hours 03/23/23 1813 03/24/23 1259   03/23/23 1338  ceFAZolin (ANCEF) 2-4 GM/100ML-% IVPB  Status:  Discontinued       Note to Pharmacy: [facility], [place]: cabinet override      03/23/23 1338 03/23/23 1532   03/23/23 1000  ceFAZolin (ANCEF)  IVPB 2g/100 mL premix        2 g 200 mL/hr over 30 Minutes Intravenous To Short Stay 03/22/23 2308 03/23/23 1600   03/23/23 0945  vancomycin (VANCOCIN) IVPB 1000 mg/200 mL premix        1,000 mg 200 mL/hr over 60 Minutes Intravenous On call to O.R. 03/23/23 0848 03/23/23 1830     .  POD/HD#: 62  52 y/o male s/p fall with R tibia/fibula fracture    -Right distal third tibial shaft fracture s/p IMN  Right fibular shaft fracture-nonoperative treatment Stress stable ankle under fluoroscopy   Weightbearing Touchdown weightbearing right leg with cam boot and crutches or walker To advance weightbearing in about 4 weeks               ROM/Activity                         Unrestricted range of motion of right ankle and knee                         Slowly increase activity level               Wound care                         Dressing change as needed    Ok to shower and clean wouds with soap and water only                PT and OT             Ice and elevation   - Pain management:             Will discontinue his Percocet and morphine             Start Norco and IV fentanyl (reported intolerance to Dilaudid)              Multimodal   - ABL anemia/Hemodynamics             Stable   - Medical issues              Home meds  Nicotine dependence   Reviewed in great detail the importance of cessation of all nicotine products with respect to bone and wound healing.  This this dramatically increases his chances  of nonunion and infection  - DVT/PE prophylaxis:             Lovenox             Will discharge on a DOAC for 30 days - ID:              Perioperative antibiotics.  Also received preoperative vancomycin due to positive MRSA PCR  - Metabolic Bone Disease:             Vitamin D deficiency                         Supplement             Metabolic bone labs pending   Total testosterone is low.  Remainder of his testosterone panel is pending.  Recommend discussion with PCP regarding TRT.  I do think that it would be beneficial to get his vitamin D levels within normal range and then recheck his testosterone panel prior to initiation of TRT   - Activity:  As above  - FEN/GI prophylaxis/Foley/Lines:             Regular diet   - Impediments to fracture healing:             Vitamin D deficiency             Low-energy fracture             Excessive caffeine intake             Nicotine dependence  Testosterone deficiency  - Dispo:                     DME             Discharge home today  Follow-up with orthopedics in 10 to 14 days  Mearl Latin, PA-C [phone] (C) 03/26/2023, 10:29 AM  [facility] [address] [place] [address] [phone] Val [phone] (F)    After 5pm and on the weekends please log on to Amion, go to orthopaedics and the look under the [facility] Call for the provider(s) on call. You can also call our office at [phone] and then follow the prompts to be connected to the call team.  Patient ID: Mindi Curling, male   DOB: 05/14/1971, 52 y.o.   MRN: [id number]

## 2023-03-26 NOTE — Progress Notes (Signed)
Orthopedic Tech Progress Note Patient Details:  Alan Sanders [birth year] [id number]  Order for Vive compression socks (15-20 mmHg) called into [facility].  Patient ID: Alan Sanders, male   DOB: Sep 27, 1971, 52 y.o.   MRN: [id number]  Docia Furl 03/26/2023, 12:03 PM

## 2023-03-26 NOTE — TOC Transition Note (Signed)
Transition of Care [facility]) - CM/SW Discharge Note   Patient Details  Name: Alan Sanders MRN: [id number] Date of Birth: [birth year]  [facility] CM/SW Contact:  Lockie Pares, RN Phone Number: 03/26/2023, 9:06 AM   Clinical Narrative:    Patient has walker and 3:1 from [employer] and has HH set up Plan to DC today, no further needs identified   Final next level of care: Home w Home Health Services Barriers to Discharge: No Barriers Identified   Patient Goals and CMS Choice      Discharge Placement                         Discharge Plan and Services Additional resources added to the After Visit Summary for     Discharge Planning Services: CM Consult Post Acute Care Choice: Home Health, Durable Medical Equipment          DME Arranged: Bedside commode, Walker rolling DME Agency: [employer] Date DME Agency Contacted: 03/24/23 Time DME Agency Contacted: 1348 Representative spoke with at DME Agency: Vaughan Basta HH Arranged: PT HH Agency: [facility] Date [facility] Agency Contacted: 03/24/23 Time HH Agency Contacted: 1348 Representative spoke with at [facility] Agency: Amy  Social Determinants of Health (SDOH) Interventions SDOH Screenings   Food Insecurity: No Food Insecurity (03/22/2023)  Housing: Low Risk  (03/22/2023)  Transportation Needs: No Transportation Needs (03/22/2023)  Utilities: Not At Risk (03/22/2023)  Depression (PHQ2-9): High Risk (02/17/2023)  Tobacco Use: High Risk (03/23/2023)     Readmission Risk Interventions     No data to display

## 2023-03-26 NOTE — Discharge Instructions (Addendum)
Orthopaedic Trauma Service Discharge Instructions   General Discharge Instructions  Orthopaedic Injuries:  Right tibia fracture treated with intramedullary nailing  WEIGHT BEARING STATUS: Touchdown weightbearing right leg with cam boot and walker or crutches x 4 weeks.   RANGE OF MOTION/ACTIVITY: Unrestricted range of motion of right ankle and knee.  Activity as tolerated while maintaining weightbearing restrictions noted above  Bone health: Labs show severe vitamin D deficiency and testosterone deficiency.  Please take vitamin D supplementation that has been prescribed for you.  Would also recommend discussing testosterone replacement with your primary care physician.  Review the following resource for additional information regarding bone health  [email or web address].cy  Wound Care: Daily wound care as needed starting on 03/29/2023.  Okay to leave incisions open to the air if there is no drainage.  Clean with soap and water only.  Use compression sock that has been provided for you, this will help with swelling  Discharge Wound Care Instructions  Do NOT apply any ointments, solutions or lotions to pin sites or surgical wounds.  These prevent needed drainage and even though solutions like hydrogen peroxide kill bacteria, they also damage cells lining the pin sites that help fight infection.  Applying lotions or ointments can keep the wounds moist and can cause them to breakdown and open up as well. This can increase the risk for infection. When in doubt call the office.  Surgical incisions should be dressed daily.  If any drainage is noted, use one layer of adaptic or Mepitel, then gauze, and tape.  Alternatively you can use a silicone foam dressing such as a Mepilex which is what you have on  currently  [email or web address] [email or web address]&[email or web address]  [email or web address]  These dressing supplies should be available at local medical supply stores ([employer], [employer], etc). They are not usually carried at places like [facility], [facility], [facility], etc  Once the incision is completely dry and without drainage, it may be left open to air out.  Showering may begin 36-48 hours later.  Cleaning gently with soap and water.   DVT/PE prophylaxis: Eliquis 2.5 mg by mouth every 12 hours for 30 days for blood clot prevention  Diet: as you were eating previously.  Can use over the counter stool softeners and bowel preparations, such as Miralax, to help with bowel movements.  Narcotics can be constipating.  Be sure to drink plenty of fluids  PAIN MEDICATION USE AND EXPECTATIONS  You have likely been given narcotic medications to help control your pain.  After a traumatic event that results in an fracture (broken bone) with or without surgery, it is ok to use narcotic pain medications to help control one's pain.  We understand that everyone responds to pain differently and each individual patient will be evaluated on a regular basis for the continued need for narcotic medications. Ideally, narcotic medication use should last no more than 6-8 weeks (coinciding with fracture healing).   As a patient it is your responsibility as well to monitor narcotic medication use and report the amount and frequency you use these medications when you come to your office visit.   We would also advise that if you are using narcotic medications, you should take a dose prior to therapy to maximize you participation.  IF YOU ARE ON NARCOTIC MEDICATIONS IT IS NOT PERMISSIBLE TO OPERATE A MOTOR VEHICLE  (MOTORCYCLE/CAR/TRUCK/MOPED) OR HEAVY MACHINERY DO NOT MIX NARCOTICS WITH OTHER CNS (CENTRAL NERVOUS SYSTEM) DEPRESSANTS SUCH AS ALCOHOL   POST-OPERATIVE OPIOID TAPER INSTRUCTIONS: It is  important to wean off of your opioid medication as soon as possible. If you do not need pain medication after your surgery it is ok to stop day one. Opioids include: Codeine, Hydrocodone(Norco, Vicodin), Oxycodone(Percocet, oxycontin) and hydromorphone amongst others.  Long term and even short term use of opiods can cause: Increased pain response Dependence Constipation Depression Respiratory depression And more.  Withdrawal symptoms can include Flu like symptoms Nausea, vomiting And more Techniques to manage these symptoms Hydrate well Eat regular healthy meals Stay active Use relaxation techniques(deep breathing, meditating, yoga) Do Not substitute Alcohol to help with tapering If you have been on opioids for less than two weeks and do not have pain than it is ok to stop all together.  Plan to wean off of opioids This plan should start within one week post op of your fracture surgery  Maintain the same interval or time between taking each dose and first decrease the dose.  Cut the total daily intake of opioids by one tablet each day Next start to increase the time between doses. The last dose that should be eliminated is the evening dose.    STOP SMOKING OR USING NICOTINE PRODUCTS!!!!  As discussed nicotine severely impairs your body's ability to heal surgical and traumatic wounds but also impairs bone healing.  Wounds and bone heal by forming microscopic blood vessels (angiogenesis) and nicotine is a vasoconstrictor (essentially, shrinks blood vessels).  Therefore, if vasoconstriction occurs to these microscopic blood vessels they essentially disappear and are unable to deliver necessary nutrients to the healing tissue.  This is one modifiable factor that you can do to dramatically increase your  chances of healing your injury.    (This means no smoking, no nicotine gum, patches, etc)  DO NOT USE NONSTEROIDAL ANTI-INFLAMMATORY DRUGS (NSAID'S)  Using products such as Advil (ibuprofen), Aleve (naproxen), Motrin (ibuprofen) for additional pain control during fracture healing can delay and/or prevent the healing response.  If you would like to take over the counter (OTC) medication, Tylenol (acetaminophen) is ok.  However, some narcotic medications that are given for pain control contain acetaminophen as well. Therefore, you should not exceed more than 4000 mg of tylenol in a day if you do not have liver disease.  Also note that there are may OTC medicines, such as cold medicines and allergy medicines that my contain tylenol as well.  If you have any questions about medications and/or interactions please ask your doctor/PA or your pharmacist.      ICE AND ELEVATE INJURED/OPERATIVE EXTREMITY  Using ice and elevating the injured extremity above your heart can help with swelling and pain control.  Icing in a pulsatile fashion, such as 20 minutes on and 20 minutes off, can be followed.    Do not place ice directly on skin. Make sure there is a barrier between to skin and the ice pack.    Using frozen items such as frozen peas works well as the conform nicely to the are that needs to be iced.  USE AN ACE WRAP OR TED HOSE FOR SWELLING CONTROL  In addition to icing and elevation, Ace wraps or TED hose are used to help limit and resolve swelling.  It is recommended to use Ace wraps or TED hose until you are informed to stop.    When using Ace Wraps start the wrapping distally (farthest away from the body) and wrap proximally (closer to the body)   Example: If you had surgery on your leg or thing and you  do not have a splint on, start the ace wrap at the toes and work your way up to the thigh        If you had surgery on your upper extremity and do not have a splint on, start the ace wrap at your fingers  and work your way up to the upper arm  IF YOU ARE IN A SPLINT OR CAST DO NOT REMOVE IT FOR ANY REASON   If your splint gets wet for any reason please contact the office immediately. You may shower in your splint or cast as long as you keep it dry.  This can be done by wrapping in a cast cover or garbage back (or similar)  Do Not stick any thing down your splint or cast such as pencils, money, or hangers to try and scratch yourself with.  If you feel itchy take benadryl as prescribed on the bottle for itching  IF YOU ARE IN A CAM BOOT (BLACK BOOT)  You may remove boot periodically. Perform daily dressing changes as noted below.  Wash the liner of the boot regularly and wear a sock when wearing the boot. It is recommended that you sleep in the boot until told otherwise    Call office for the following: Temperature greater than 101F Persistent nausea and vomiting Severe uncontrolled pain Redness, tenderness, or signs of infection (pain, swelling, redness, odor or green/yellow discharge around the site) Difficulty breathing, headache or visual disturbances Hives Persistent dizziness or light-headedness Extreme fatigue Any other questions or concerns you may have after discharge  In an emergency, call 911 or go to an Emergency Department at a nearby hospital  HELPFUL INFORMATION  If you had a block, it will wear off between 8-24 hrs postop typically.  This is period when your pain may go from nearly zero to the pain you would have had postop without the block.  This is an abrupt transition but nothing dangerous is happening.  You may take an extra dose of narcotic when this happens.  You should wean off your narcotic medicines as soon as you are able.  Most patients will be off or using minimal narcotics before their first postop appointment.   We suggest you use the pain medication the first night prior to going to bed, in order to ease any pain when the anesthesia wears off. You should  avoid taking pain medications on an empty stomach as it will make you nauseous.  Do not drink alcoholic beverages or take illicit drugs when taking pain medications.  In most states it is against the law to drive while you are in a splint or sling.  And certainly against the law to drive while taking narcotics.  You may return to work/school in the next couple of days when you feel up to it.   Pain medication may make you constipated.  Below are a few solutions to try in this order: Decrease the amount of pain medication if you aren't having pain. Drink lots of decaffeinated fluids. Drink prune juice and/or each dried prunes  If the first 3 don't work start with additional solutions Take Colace - an over-the-counter stool softener Take Senokot - an over-the-counter laxative Take Miralax - a stronger over-the-counter laxative     CALL THE OFFICE WITH ANY QUESTIONS OR CONCERNS: [phone]   VISIT OUR WEBSITE FOR ADDITIONAL INFORMATION: [email or web address]     Information on my medicine - ELIQUIS (apixaban)  This medication education was reviewed with me or my  healthcare representative as part of my discharge preparation.    Why was Eliquis prescribed for you? Eliquis was prescribed for you to reduce the risk of blood clots forming after orthopedic surgery.    What do You need to know about Eliquis? Take your Eliquis TWICE DAILY - one tablet in the morning and one tablet in the evening with or without food.  It would be best to take the dose about the same time each day.  If you have difficulty swallowing the tablet whole please discuss with your pharmacist how to take the medication safely.  Take Eliquis exactly as prescribed by your doctor and DO NOT stop taking Eliquis without talking to the doctor who prescribed the medication.  Stopping without other medication to take the place of Eliquis may increase your risk of developing a clot.  After discharge, you should  have regular check-up appointments with your healthcare provider that is prescribing your Eliquis.  What do you do if you miss a dose? If a dose of ELIQUIS is not taken at the scheduled time, take it as soon as possible on the same day and twice-daily administration should be resumed.  The dose should not be doubled to make up for a missed dose.  Do not take more than one tablet of ELIQUIS at the same time.  Important Safety Information A possible side effect of Eliquis is bleeding. You should call your healthcare provider right away if you experience any of the following: Bleeding from an injury or your nose that does not stop. Unusual colored urine (red or dark brown) or unusual colored stools (red or black). Unusual bruising for unknown reasons. A serious fall or if you hit your head (even if there is no bleeding).  Some medicines may interact with Eliquis and might increase your risk of bleeding or clotting while on Eliquis. To help avoid this, consult your healthcare provider or pharmacist prior to using any new prescription or non-prescription medications, including herbals, vitamins, non-steroidal anti-inflammatory drugs (NSAIDs) and supplements.  This website has more information on Eliquis (apixaban): http://www.eliquis.com/eliquis/home

## 2023-03-26 NOTE — Discharge Summary (Signed)
Orthopaedic Trauma Service (OTS) Discharge Summary   Patient ID: Alan Sanders MRN: [id number] DOB/AGE: 1970/12/21 52 y.o.  Admit date: 03/22/2023 Discharge date: 03/26/2023  Admission Diagnoses: Closed right tibia and fibula fracture Nicotine dependence  Discharge Diagnoses:  Principal Problem:   Closed right tibial fracture Active Problems:   Tobacco abuse   Nicotine dependence   Vitamin D deficiency   Testosterone deficiency in male   Past Medical History:  Diagnosis Date   Anxiety    situational anxiety-on meds   Arthritis    neck/back/LEFT wrist   Asthma    PRN inhaler   Balance problem    Depression    on meds   Family history of colonic polyps 05/08/2022   Gait difficulty    GERD (gastroesophageal reflux disease)    OTC PRN meds   Headache    migraines   History of colonic polyps 05/08/2022   Inguinal hernia of left side without obstruction or gangrene    Pneumonia    Seasonal allergies    Seizures (HCC)    " its been along time ,since my last seizure "   Spleen enlarged    Testosterone deficiency in male 03/26/2023   Thrombocytopenia (HCC)    Tobacco abuse      Procedures Performed: 03/23/2023-Dr. Carola Frost  Intramedullary nailing right tibia fracture Stress evaluation of right ankle under fluoroscopy, stress stable  Discharged Condition: good  Hospital Course:   Patient is a 52 year old male who was admitted to [facility] on 03/22/2023 after sustaining a ground-level fall at home resulting in an unstable right tibia and fibula fracture.  Orthopedic trauma service was consulted for definitive management.  He was taken to the operating room on 03/23/2023 for the procedures noted above.  Patient tolerated the procedure well.  After surgery was transferred to the PACU for recovery of anesthesia and then transferred to the orthopedic floor for observation, pain control and therapies.  Given the mechanism of his injury we proceeded with  metabolic bone workup which demonstrated severe vitamin D deficiency.  We also did order some testosterone labs and at the time of this dictation his total testosterone was in the deficient range at 87 ng/dL.  The remainder of his testosterone panel is pending.  TSH, PTH or at acceptable levels.  A1c is at 5%  No particular issues were noted during his hospital stay.  Patient started working with therapies on postoperative day #1.  He progressed well.  Was having some difficulty maintaining touchdown weightbearing on his right leg.  He was covered with Ancef for perioperative antibiosis.  He was started on Lovenox for DVT and PE prophylaxis on postop day 1 and was transition to Eliquis at discharge for the next 30 days for clot prophylaxis.  Patient discharged in stable condition on 03/26/2023.  Dressings were changed prior to discharge.  We also discussed the importance of cessation from all nicotine products for bone and wound healing.  We discussed that these increases risk of nonunion and deep infection.  Of note patient tolerated Norco better than oxycodone with less sedative effects and better pain control   Consults: None  Significant Diagnostic Studies: labs:   Latest Reference Range & Units 03/23/23 01:47 03/24/23 04:52  COMPREHENSIVE METABOLIC PANEL  Rpt ! Rpt !  Sodium 135 - 145 mmol/L 131 (L) 134 (L)  Potassium 3.5 - 5.1 mmol/L 3.6 4.1  Chloride 98 - 111 mmol/L 100 97 (L)  CO2 22 - 32 mmol/L 23 27  Glucose 70 - 99 mg/dL 409 (H) 811 (H)  Mean Plasma Glucose mg/dL  91.4  BUN 6 - 20 mg/dL 7 10  Creatinine 7.82 - 1.24 mg/dL 9.56 2.13  Calcium 8.9 - 10.3 mg/dL 8.7 (L) 8.8 (L)  Anion gap 5 - 15  8 10   Calcium, Ionized, Serum 4.5 - 5.6 mg/dL  4.7  Phosphorus 2.5 - 4.6 mg/dL  3.0  Magnesium 1.7 - 2.4 mg/dL  2.0  Alkaline Phosphatase 38 - 126 U/L 79 67  Albumin 3.5 - 5.0 g/dL 3.9 3.8  AST 15 - 41 U/L 34 30  ALT 0 - 44 U/L 53 (H) 39  Total Protein 6.5 - 8.1 g/dL 6.4 (L) 6.8  Total  Bilirubin 0.3 - 1.2 mg/dL 0.9 0.6  GFR, Estimated >60 mL/min >60 >60  Vit D, 1,25-Dihydroxy 24.8 - 81.5 pg/mL  69.7  Vitamin D, 25-Hydroxy 30 - 100 ng/mL 8.70 (L)   WBC 4.0 - 10.5 K/uL 10.2 8.4  RBC 4.22 - 5.81 MIL/uL 5.04 4.56  Hemoglobin 13.0 - 17.0 g/dL 08.6 57.8 (L)  HCT 46.9 - 52.0 % 41.0 36.8 (L)  MCV 80.0 - 100.0 fL 81.3 80.7  MCH 26.0 - 34.0 pg 27.8 28.3  MCHC 30.0 - 36.0 g/dL 62.9 52.8  RDW 41.3 - 24.4 % 15.4 15.2  Platelets 150 - 400 K/uL 118 (L) 110 (L)  nRBC 0.0 - 0.2 % 0.0 0.0  Hemoglobin A1C 4.8 - 5.6 %  5.0  Sex Horm Binding Glob, Serum 19.3 - 76.4 nmol/L  26.8  Testosterone 264 - 916 ng/dL  87 (L)  PTH, Intact 15 - 65 pg/mL  25  TSH 0.350 - 4.500 uIU/mL  0.569  !: Data is abnormal (L): Data is abnormally low (H): Data is abnormally high Rpt: View report in Results Review for more information   Treatments: IV hydration, antibiotics: Ancef, analgesia: acetaminophen, IV fentanyl, Norco, anticoagulation: LMW heparin and Eliquis at discharge, therapies: PT, OT, and RN, and surgery: As above  Discharge Exam:   Orthopaedic Trauma Service Progress Note   Patient ID: Alan Sanders MRN: [id number] DOB/AGE: [birth year] 52 y.o.   Subjective:   Pain tolerable Had 3 cans of dip in his room    Smokes and dips    Wants to go home today      ROS As above   Objective:    VITALS:         Vitals:    03/25/23 1350 03/25/23 1930 03/26/23 0427 03/26/23 0746  BP: 108/69 108/69 (!) 109/58 116/64  Pulse: 68 65 (!) 59 67  Resp: 17 17 17 18   Temp: 98.3 F (36.8 C) 97.9 F (36.6 C) 98 F (36.7 C) 98.4 F (36.9 C)  TempSrc: Oral Oral   Oral  SpO2: 94% 94% 99% 95%  Weight:          Height:              Estimated body mass index is 28.37 kg/m as calculated from the following:   Height as of this encounter: 6\' 1"  (1.854 m).   Weight as of this encounter: 97.5 kg.     Intake/Output      05/01 0701 05/02 0700 05/02 0701 05/03 0700   P.O.     Total  Intake(mL/kg)     Net             LABS   Latest Reference Range & Units 03/24/23 04:52  Testosterone 264 - 916 ng/dL 87 (L)  (  L): Data is abnormally low   PHYSICAL EXAM:    Gen: sitting up in bed, NAD, pleasant  Lungs: unlabored, clear anterior fields  Cardiac: RRR Abd: + BS, NT Ext:       Right Lower Extremity              cam boot is off             Dressing is clean, dry and intact                         Dressings removed                         All incisions are dry                          No signs of infection              Moderate ecchymosis to ankle and foot              Extremity is warm             Minimal swelling             No pain out of proportion with passive stretching of his toes             + DP pulse             Distal motor and sensory functions are grossly intact   Assessment/Plan: 3 Days Post-Op    Principal Problem:   Closed right tibial fracture Active Problems:   Tobacco abuse   Nicotine dependence   Vitamin D deficiency     Anti-infectives (From admission, onward)        Start     Dose/Rate Route Frequency Ordered Stop    03/24/23 1315   ceFAZolin (ANCEF) IVPB 2g/100 mL premix        2 g 200 mL/hr over 30 Minutes Intravenous  Once 03/24/23 1223 03/24/23 1310    03/23/23 1900   ceFAZolin (ANCEF) IVPB 1 g/50 mL premix        1 g 100 mL/hr over 30 Minutes Intravenous Every 6 hours 03/23/23 1813 03/24/23 1259    03/23/23 1338   ceFAZolin (ANCEF) 2-4 GM/100ML-% IVPB  Status:  Discontinued       Note to Pharmacy: [facility], [place]: cabinet override         03/23/23 1338 03/23/23 1532    03/23/23 1000   ceFAZolin (ANCEF) IVPB 2g/100 mL premix        2 g 200 mL/hr over 30 Minutes Intravenous To Short Stay 03/22/23 2308 03/23/23 1600    03/23/23 0945   vancomycin (VANCOCIN) IVPB 1000 mg/200 mL premix        1,000 mg 200 mL/hr over 60 Minutes Intravenous On call to O.R. 03/23/23 0848 03/23/23 1830         .   POD/HD#: 47   52 y/o male  s/p fall with R tibia/fibula fracture    -Right distal third tibial shaft fracture s/p IMN  Right fibular shaft fracture-nonoperative treatment Stress stable ankle under fluoroscopy   Weightbearing Touchdown weightbearing right leg with cam boot and crutches or walker To advance weightbearing in about 4 weeks               ROM/Activity  Unrestricted range of motion of right ankle and knee                         Slowly increase activity level               Wound care                         Dressing change as needed                          Ok to shower and clean wouds with soap and water only                PT and OT             Ice and elevation   - Pain management:             Will discontinue his Percocet and morphine             Start Norco and IV fentanyl (reported intolerance to Dilaudid)              Multimodal   - ABL anemia/Hemodynamics             Stable   - Medical issues              Home meds             Nicotine dependence                         Reviewed in great detail the importance of cessation of all nicotine products with respect to bone and wound healing.  This this dramatically increases his chances of nonunion and infection   - DVT/PE prophylaxis:             Lovenox             Will discharge on a DOAC for 30 days - ID:              Perioperative antibiotics.  Also received preoperative vancomycin due to positive MRSA PCR   - Metabolic Bone Disease:             Vitamin D deficiency                         Supplement             Metabolic bone labs pending                         Total testosterone is low.  Remainder of his testosterone panel is pending.  Recommend discussion with PCP regarding TRT.  I do think that it would be beneficial to get his vitamin D levels within normal range and then recheck his testosterone panel prior to initiation of TRT   - Activity:             As above   - FEN/GI  prophylaxis/Foley/Lines:             Regular diet   - Impediments to fracture healing:             Vitamin D deficiency             Low-energy fracture             Excessive  caffeine intake             Nicotine dependence             Testosterone deficiency   - Dispo:                     DME             Discharge home today             Follow-up with orthopedics in 10 to 14 days    Disposition: Discharge disposition: 01-Home or Self Care       Discharge Instructions     Call MD / Call 911   Complete by: As directed    If you experience chest pain or shortness of breath, CALL 911 and be transported to the hospital emergency room.  If you develope a fever above 101 F, pus (white drainage) or increased drainage or redness at the wound, or calf pain, call your surgeon's office.   Constipation Prevention   Complete by: As directed    Drink plenty of fluids.  Prune juice may be helpful.  You may use a stool softener, such as Colace (over the counter) 100 mg twice a day.  Use MiraLax (over the counter) for constipation as needed.   Diet general   Complete by: As directed    Discharge instructions   Complete by: As directed    Orthopaedic Trauma Service Discharge Instructions   General Discharge Instructions  Orthopaedic Injuries:  Right tibia fracture treated with intramedullary nailing  WEIGHT BEARING STATUS: Touchdown weightbearing right leg with cam boot and walker or crutches x 4 weeks.   RANGE OF MOTION/ACTIVITY: Unrestricted range of motion of right ankle and knee.  Activity as tolerated while maintaining weightbearing restrictions noted above  Bone health: Labs show severe vitamin D deficiency and testosterone deficiency.  Please take vitamin D supplementation that has been prescribed for you.  Would also recommend discussing testosterone replacement with your primary care physician.  Review the following resource for additional information regarding bone  health  [email or web address]  Wound Care: Daily wound care as needed starting on 03/29/2023.  Okay to leave incisions open to the air if there is no drainage.  Clean with soap and water only.  Use compression sock that has been provided for you, this will help with swelling  Discharge Wound Care Instructions  Do NOT apply any ointments, solutions or lotions to pin sites or surgical wounds.  These prevent needed drainage and even though solutions like hydrogen peroxide kill bacteria, they also damage cells lining the pin sites that help fight infection.  Applying lotions or ointments can keep the wounds moist and can cause them to breakdown and open up as well. This can increase the risk for infection. When in doubt call the office.  Surgical incisions should be dressed daily.  If any drainage is noted, use one layer of adaptic or Mepitel, then gauze, and tape.  Alternatively you can use a silicone foam dressing such as a Mepilex which is what you have on currently  [email or web address] [email or web address]-[email or web address]_[email or web address]&[email or web address]  [email or web address]  These dressing supplies should be available at local medical supply stores ([employer], [employer], etc). They are not usually carried at places like [facility], [facility], [employer], etc  Once the incision is completely dry and without drainage, it may be left open to air out.  Showering may begin 36-48 hours later.  Cleaning gently with soap and water.   DVT/PE  prophylaxis: Eliquis 2.5 mg by mouth every 12 hours for 30 days for blood clot prevention  Diet: as you were eating previously.  Can use over the counter stool softeners and bowel preparations, such as Miralax, to help with bowel movements.  Narcotics can be constipating.  Be  sure to drink plenty of fluids  PAIN MEDICATION USE AND EXPECTATIONS  You have likely been given narcotic medications to help control your pain.  After a traumatic event that results in an fracture (broken bone) with or without surgery, it is ok to use narcotic pain medications to help control one's pain.  We understand that everyone responds to pain differently and each individual patient will be evaluated on a regular basis for the continued need for narcotic medications. Ideally, narcotic medication use should last no more than 6-8 weeks (coinciding with fracture healing).   As a patient it is your responsibility as well to monitor narcotic medication use and report the amount and frequency you use these medications when you come to your office visit.   We would also advise that if you are using narcotic medications, you should take a dose prior to therapy to maximize you participation.  IF YOU ARE ON NARCOTIC MEDICATIONS IT IS NOT PERMISSIBLE TO OPERATE A MOTOR VEHICLE (MOTORCYCLE/CAR/TRUCK/MOPED) OR HEAVY MACHINERY DO NOT MIX NARCOTICS WITH OTHER CNS (CENTRAL NERVOUS SYSTEM) DEPRESSANTS SUCH AS ALCOHOL   POST-OPERATIVE OPIOID TAPER INSTRUCTIONS:  It is important to wean off of your opioid medication as soon as possible. If you do not need pain medication after your surgery it is ok to stop day one.  Opioids include:  o Codeine, Hydrocodone(Norco, Vicodin), Oxycodone(Percocet, oxycontin) and hydromorphone amongst others.   Long term and even short term use of opiods can cause:  o Increased pain response  o Dependence  o Constipation  o Depression  o Respiratory depression  o And more.   Withdrawal symptoms can include  o Flu like symptoms  o Nausea, vomiting  o And more  Techniques to manage these symptoms  o Hydrate well  o Eat regular healthy meals  o Stay active  o Use relaxation techniques(deep breathing, meditating, yoga)  Do Not substitute Alcohol to help with  tapering  If you have been on opioids for less than two weeks and do not have pain than it is ok to stop all together.   Plan to wean off of opioids  o This plan should start within one week post op of your fracture surgery   o Maintain the same interval or time between taking each dose and first decrease the dose.   o Cut the total daily intake of opioids by one tablet each day  o Next start to increase the time between doses.  o The last dose that should be eliminated is the evening dose.    STOP SMOKING OR USING NICOTINE PRODUCTS!!!!  As discussed nicotine severely impairs your body's ability to heal surgical and traumatic wounds but also impairs bone healing.  Wounds and bone heal by forming microscopic blood vessels (angiogenesis) and nicotine is a vasoconstrictor (essentially, shrinks blood vessels).  Therefore, if vasoconstriction occurs to these microscopic blood vessels they essentially disappear and are unable to deliver necessary nutrients to the healing tissue.  This is one modifiable factor that you can do to dramatically increase your chances of healing your injury.    (This means no smoking, no nicotine gum, patches, etc)  DO NOT USE NONSTEROIDAL ANTI-INFLAMMATORY DRUGS (NSAID'S)  Using  products such as Advil (ibuprofen), Aleve (naproxen), Motrin (ibuprofen) for additional pain control during fracture healing can delay and/or prevent the healing response.  If you would like to take over the counter (OTC) medication, Tylenol (acetaminophen) is ok.  However, some narcotic medications that are given for pain control contain acetaminophen as well. Therefore, you should not exceed more than 4000 mg of tylenol in a day if you do not have liver disease.  Also note that there are may OTC medicines, such as cold medicines and allergy medicines that my contain tylenol as well.  If you have any questions about medications and/or interactions please ask your doctor/PA or your pharmacist.       ICE AND ELEVATE INJURED/OPERATIVE EXTREMITY  Using ice and elevating the injured extremity above your heart can help with swelling and pain control.  Icing in a pulsatile fashion, such as 20 minutes on and 20 minutes off, can be followed.    Do not place ice directly on skin. Make sure there is a barrier between to skin and the ice pack.    Using frozen items such as frozen peas works well as the conform nicely to the are that needs to be iced.  USE AN ACE WRAP OR TED HOSE FOR SWELLING CONTROL  In addition to icing and elevation, Ace wraps or TED hose are used to help limit and resolve swelling.  It is recommended to use Ace wraps or TED hose until you are informed to stop.    When using Ace Wraps start the wrapping distally (farthest away from the body) and wrap proximally (closer to the body)   Example: If you had surgery on your leg or thing and you do not have a splint on, start the ace wrap at the toes and work your way up to the thigh        If you had surgery on your upper extremity and do not have a splint on, start the ace wrap at your fingers and work your way up to the upper arm  IF YOU ARE IN A SPLINT OR CAST DO NOT REMOVE IT FOR ANY REASON   If your splint gets wet for any reason please contact the office immediately. You may shower in your splint or cast as long as you keep it dry.  This can be done by wrapping in a cast cover or garbage back (or similar)  Do Not stick any thing down your splint or cast such as pencils, money, or hangers to try and scratch yourself with.  If you feel itchy take benadryl as prescribed on the bottle for itching  IF YOU ARE IN A CAM BOOT (BLACK BOOT)  You may remove boot periodically. Perform daily dressing changes as noted below.  Wash the liner of the boot regularly and wear a sock when wearing the boot. It is recommended that you sleep in the boot until told otherwise    Call office for the following: ? Temperature greater than  101F ? Persistent nausea and vomiting ? Severe uncontrolled pain ? Redness, tenderness, or signs of infection (pain, swelling, redness, odor or green/yellow discharge around the site) ? Difficulty breathing, headache or visual disturbances ? Hives ? Persistent dizziness or light-headedness ? Extreme fatigue ? Any other questions or concerns you may have after discharge  In an emergency, call 911 or go to an Emergency Department at a nearby hospital  HELPFUL INFORMATION  ? If you had a block, it will wear off  between 8-24 hrs postop typically.  This is period when your pain may go from nearly zero to the pain you would have had postop without the block.  This is an abrupt transition but nothing dangerous is happening.  You may take an extra dose of narcotic when this happens.  ? You should wean off your narcotic medicines as soon as you are able.  Most patients will be off or using minimal narcotics before their first postop appointment.   ? We suggest you use the pain medication the first night prior to going to bed, in order to ease any pain when the anesthesia wears off. You should avoid taking pain medications on an empty stomach as it will make you nauseous.  ? Do not drink alcoholic beverages or take illicit drugs when taking pain medications.  ? In most states it is against the law to drive while you are in a splint or sling.  And certainly against the law to drive while taking narcotics.  ? You may return to work/school in the next couple of days when you feel up to it.   ? Pain medication may make you constipated.  Below are a few solutions to try in this order:   ? Decrease the amount of pain medication if you aren't having pain.   ? Drink lots of decaffeinated fluids.   ? Drink prune juice and/or each dried prunes   o If the first 3 don't work start with additional solutions   ? Take Colace - an over-the-counter stool softener   ? Take Senokot - an over-the-counter  laxative   ? Take Miralax - a stronger over-the-counter laxative     CALL THE OFFICE WITH ANY QUESTIONS OR CONCERNS: [phone]   VISIT OUR WEBSITE FOR ADDITIONAL INFORMATION: [email or web address]   Driving restrictions   Complete by: As directed    No driving for 9 weeks   Increase activity slowly as tolerated   Complete by: As directed    Post-operative opioid taper instructions:   Complete by: As directed    POST-OPERATIVE OPIOID TAPER INSTRUCTIONS: It is important to wean off of your opioid medication as soon as possible. If you do not need pain medication after your surgery it is ok to stop day one. Opioids include: Codeine, Hydrocodone(Norco, Vicodin), Oxycodone(Percocet, oxycontin) and hydromorphone amongst others.  Long term and even short term use of opiods can cause: Increased pain response Dependence Constipation Depression Respiratory depression And more.  Withdrawal symptoms can include Flu like symptoms Nausea, vomiting And more Techniques to manage these symptoms Hydrate well Eat regular healthy meals Stay active Use relaxation techniques(deep breathing, meditating, yoga) Do Not substitute Alcohol to help with tapering If you have been on opioids for less than two weeks and do not have pain than it is ok to stop all together.  Plan to wean off of opioids This plan should start within one week post op of your joint replacement. Maintain the same interval or time between taking each dose and first decrease the dose.  Cut the total daily intake of opioids by one tablet each day Next start to increase the time between doses. The last dose that should be eliminated is the evening dose.      Touch down weight bearing   Complete by: As directed    Laterality: right   Extremity: Lower      Allergies as of 03/26/2023       Reactions   Hydromorphone Itching, Rash  Medication List     STOP taking these medications    QUEtiapine 25 MG  tablet Commonly known as: SEROQUEL       TAKE these medications    albuterol 108 (90 Base) MCG/ACT inhaler Commonly known as: VENTOLIN HFA Inhale 1-2 puffs into the lungs every 6 (six) hours as needed for wheezing or shortness of breath. What changed:  how much to take when to take this   apixaban 2.5 MG Tabs tablet Commonly known as: Eliquis Take 1 tablet (2.5 mg total) by mouth 2 (two) times daily.   ascorbic acid 1000 MG tablet Commonly known as: VITAMIN C Take 1 tablet (1,000 mg total) by mouth daily. Start taking on: Mar 27, 2023   calcium citrate 950 (200 Ca) MG tablet Commonly known as: CALCITRATE - dosed in mg elemental calcium Take 1 tablet (200 mg of elemental calcium total) by mouth 2 (two) times daily.   chlorhexidine 4 % external liquid Commonly known as: HIBICLENS Apply 15 mLs (1 Application total) topically as directed for 30 doses. Use as directed daily for 5 days every other week for 6 weeks.   docusate sodium 100 MG capsule Commonly known as: COLACE Take 1 capsule (100 mg total) by mouth 2 (two) times daily.   escitalopram 10 MG tablet Commonly known as: Lexapro Take 1 tablet (10 mg total) by mouth daily for 7 days, THEN 2 tablets (20 mg total) daily. Start taking on: February 17, 2023   gabapentin 300 MG capsule Commonly known as: NEURONTIN Take 1 capsule (300 mg total) by mouth 3 (three) times daily. What changed:  how much to take when to take this additional instructions   HYDROcodone-acetaminophen 7.5-325 MG tablet Commonly known as: NORCO Take 1-2 tablets by mouth every 6 (six) hours as needed for severe pain or moderate pain.   methocarbamol 750 MG tablet Commonly known as: ROBAXIN Take 1 tablet (750 mg total) by mouth 3 (three) times daily as needed.   mupirocin ointment 2 % Commonly known as: BACTROBAN Place 1 Application into the nose 2 (two) times daily for 60 doses. Use as directed 2 times daily for 5 days every other week for 6  weeks.   pantoprazole 40 MG tablet Commonly known as: PROTONIX Take 1 tablet (40 mg total) by mouth daily.   Vitamin D (Ergocalciferol) 1.25 MG (50000 UNIT) Caps capsule Commonly known as: DRISDOL Take 1 capsule (50,000 Units total) by mouth every 7 (seven) days.   Vitamin D 125 MCG (5000 UT) Caps Take 1 capsule by mouth daily.               Durable Medical Equipment  (From admission, onward)           Start     Ordered   03/24/23 1342  For home use only DME Bedside commode  Once       Question:  Patient needs a bedside commode to treat with the following condition  Answer:  Weakness   03/24/23 1342   03/24/23 1342  For home use only DME Walker rolling  Once       Question Answer Comment  Walker: With 5 Inch Wheels   Patient needs a walker to treat with the following condition Weakness      03/24/23 1342              Discharge Care Instructions  (From admission, onward)           Start  Ordered   03/26/23 0000  Touch down weight bearing       Question Answer Comment  Laterality right   Extremity Lower      03/26/23 1157            Follow-up Information     [facility] Follow up.   Why: for home health services, they will call you to set up a time to come out to your house Contact information: [address] [place] [address] [phone]         Myrene Galas, MD Follow up.   Specialty: Orthopedic Surgery Contact information: [address] [place] [address] [phone]                 Discharge Instructions and Plan:  52 y/o male s/p fall with R tibia/fibula fracture s/p IMN R tibia   Weightbearing: TDWB RLE Insicional and dressing care: Daily dressing changes with mepilex or 4x4 gauze and tape plus compression sock starting in 3 days  Orthopedic device(s): CAM boot and crutches  Showering: ok to shower and clean wounds with soap and water only  VTE prophylaxis: Eliquis 2.5  mg p.o. twice daily for 30 days Pain control: multimodal: norco, robaxin, tylenol Bone Health/Optimization: vitamin d 2 50,000 IU weekly, vitamin d3 5000 IU daily. Recheck testosterone panel in 8 weeks  Follow - up plan: 2 weeks Contact information:  Myrene Galas MD, Montez Morita PA-C    Signed:  Mearl Latin, PA-C [phone] (C) 03/26/2023, 11:57 AM  [facility] [address] [place] [address] [phone] Collier Bullock (F)

## 2023-03-26 NOTE — Progress Notes (Addendum)
After discussing discharge instructions with pt and his wife, nurse called to clarify medication orders with PA, Montez Morita.  Pt does not need to do the chlorhexidine wash and only needs to complete a total of 5 days of the mupirocin ointment in his nares.  Mupirocin was started on 03/24/23 pm dose, and will need to continue until 03/29/23 pm dose, pt will be informed of the above.      Discharge instructions reviewed with pt and his wife.  Copy of instructions given to pt. [facility] are filling the pt's scripts and will be ready in next 10-15 minutes per pharmacy for pick up or delivery. Pt's Vive compression sock was delivered to the pt's room, pt's wife will apply after pt washes up when he gets home.  Pt states MD/PA told him he could shower starting today when he gets home. All instructions reviewed/read to pt and wife and copy given to pt. Pt and wife verbalized understanding of instructions and able to teach back with discussion.  Pt to be d/c'd via wheelchair with belongings, with his wife once TOC meds ready.         To be escorted by staff.    Annice Needy, RN SWOT

## 2023-03-27 ENCOUNTER — Encounter (HOSPITAL_COMMUNITY): Payer: Self-pay | Admitting: Orthopedic Surgery

## 2023-03-27 ENCOUNTER — Telehealth: Payer: Self-pay

## 2023-03-27 NOTE — Transitions of Care (Post Inpatient/ED Visit) (Signed)
03/27/2023  Name: Alan Sanders MRN: [id number] DOB: 06-07-1971  Today's TOC FU Call Status: Today's TOC FU Call Status:: Successful TOC FU Call Competed TOC FU Call Complete Date: 03/27/23  Transition Care Management Follow-up Telephone Call Date of Discharge: 03/26/23 Discharge Facility: [facility]) Type of Discharge: Inpatient Admission Primary Inpatient Discharge Diagnosis:: Closed right tibial fracture How have you been since you were released from the hospital?: Same Any questions or concerns?: No  Items Reviewed: Did you receive and understand the discharge instructions provided?: Yes Medications obtained,verified, and reconciled?: Yes (Medications Reviewed) Any new allergies since your discharge?: No Dietary orders reviewed?: No Do you have support at home?: Yes People in Home: significant other  Medications Reviewed Today: Medications Reviewed Today     Reviewed by Milus Mallick, CMA (Certified Medical Assistant) on 03/27/23 at [phone]  Med List Status: <None>   Medication Order Taking? Sig Documenting Provider Last Dose Status Informant  albuterol (VENTOLIN HFA) 108 (90 Base) MCG/ACT inhaler [id number] Yes Inhale 1-2 puffs into the lungs every 6 (six) hours as needed for wheezing or shortness of breath.  Patient taking differently: Inhale 2-3 puffs into the lungs as needed for wheezing or shortness of breath.   Loyola Mast, MD Taking Active Pharmacy Records, Family Member           Med Note [facility] [place], [place] A   Sun Mar 22, 2023  4:16 PM)    apixaban (ELIQUIS) 2.5 MG TABS tablet [id number] Yes Take 1 tablet (2.5 mg total) by mouth 2 (two) times daily. Montez Morita, PA-C Taking Active   ascorbic acid (VITAMIN C) 1000 MG tablet [id number] Yes Take 1 tablet (1,000 mg total) by mouth daily. Montez Morita, PA-C Taking Active   calcium carbonate (TUMS - DOSED IN MG ELEMENTAL CALCIUM) 500 MG chewable tablet [id number] Yes Chew 1 tablet (200 mg of elemental calcium  total) by mouth 2 (two) times daily. Montez Morita, PA-C Taking Active   chlorhexidine (HIBICLENS) 4 % external liquid [id number] Yes Apply 15 mLs (1 Application total) topically as directed for 30 doses. Use as directed daily for 5 days every other week for 6 weeks. Montez Morita, PA-C Taking Active   Cholecalciferol 125 MCG (5000 UT) TABS [id number] Yes Take 1 tablet (5,000 Units total) by mouth daily. Montez Morita, PA-C Taking Active   docusate sodium (COLACE) 100 MG capsule [id number] Yes Take 1 capsule (100 mg total) by mouth 2 (two) times daily. Montez Morita, PA-C Taking Active   escitalopram (LEXAPRO) 10 MG tablet [id number] Yes Take 1 tablet (10 mg total) by mouth daily for 7 days, THEN 2 tablets (20 mg total) daily. Mliss Sax, MD Taking Active Pharmacy Records, Family Member  gabapentin (NEURONTIN) 300 MG capsule [id number] Yes Take 1 capsule (300 mg total) by mouth 3 (three) times daily.  Patient taking differently: Take 300-900 mg by mouth See admin instructions. Take 300 mg by mouth in the afternoon and 900 mg by mouth at bedtime.   Mliss Sax, MD Taking Active Pharmacy Records, Family Member  HYDROcodone-acetaminophen [facility]) 7.5-325 MG tablet [id number] Yes Take 1-2 tablets by mouth every 6 (six) hours as needed for severe pain or moderate pain. Montez Morita, PA-C Taking Active   methocarbamol (ROBAXIN) 750 MG tablet [id number] Yes Take 1 tablet (750 mg total) by mouth 3 (three) times daily as needed.  Taking Active Pharmacy Records, Family Member  mupirocin ointment (BACTROBAN) 2 % [id number] Yes Place 1 Application into the nose  2 (two) times daily for 60 doses. Use as directed 2 times daily for 5 days every other week for 6 weeks. Montez Morita, PA-C Taking Active   pantoprazole (PROTONIX) 40 MG tablet [id number]  Take 1 tablet (40 mg total) by mouth daily. Jenel Lucks, MD  Expired 03/25/23 2359 Pharmacy Records, Family Member  Vitamin D, Ergocalciferol, (DRISDOL) 1.25 MG  (50000 UNIT) CAPS capsule [id number] Yes Take 1 capsule (50,000 Units total) by mouth every 7 (seven) days. Montez Morita, PA-C Taking Active             Home Care and Equipment/Supplies: Were Home Health Services Ordered?: Yes Name of Home Health Agency:: [facility] Has Agency set up a time to come to your home?: No Any new equipment or medical supplies ordered?: NA  Functional Questionnaire: Do you need assistance with bathing/showering or dressing?: Yes Do you need assistance with meal preparation?: Yes Do you need assistance with eating?: No Do you have difficulty maintaining continence: No Do you need assistance with getting out of bed/getting out of a chair/moving?: Yes Do you have difficulty managing or taking your medications?: No  Follow up appointments reviewed: PCP Follow-up appointment confirmed?: Yes Date of PCP follow-up appointment?: 04/01/23 Follow-up Provider: Dr. Doreene Burke [facility] Follow-up appointment confirmed?: NA Do you need transportation to your follow-up appointment?: No Do you understand care options if your condition(s) worsen?: Yes-patient verbalized understanding    SIGNATURE. Kandis Cocking, CMA (AAMA)  [facility] [phone]

## 2023-03-28 LAB — TESTOSTERONE, FREE: Testosterone, Free: 0.6 pg/mL — ABNORMAL LOW (ref 7.2–24.0)

## 2023-03-30 LAB — TESTOSTERONE, % FREE: Testosterone-% Free: 1.8 % — ABNORMAL HIGH (ref 0.2–0.7)

## 2023-04-01 ENCOUNTER — Encounter: Payer: Commercial Managed Care - PPO | Admitting: Family Medicine

## 2023-04-03 LAB — DRUG SCREEN 10 W/CONF, SERUM
Amphetamines, IA: NEGATIVE ng/mL
Barbiturates, IA: NEGATIVE ug/mL
Benzodiazepines, IA: NEGATIVE ng/mL
Cocaine & Metabolite, IA: NEGATIVE ng/mL
Methadone, IA: NEGATIVE ng/mL
Opiates, IA: NEGATIVE ng/mL
Oxycodones, IA: POSITIVE ng/mL — AB
Phencyclidine, IA: NEGATIVE ng/mL
Propoxyphene, IA: NEGATIVE ng/mL
THC(Marijuana) Metabolite, IA: NEGATIVE ng/mL

## 2023-04-03 LAB — OPIATES,MS,WB/SP RFX
6-Acetylmorphine: NEGATIVE
Codeine: NEGATIVE ng/mL
Dihydrocodeine: NEGATIVE ng/mL
Hydrocodone: NEGATIVE ng/mL
Hydromorphone: NEGATIVE ng/mL
Morphine: NEGATIVE ng/mL
Opiate Confirmation: NEGATIVE

## 2023-04-03 LAB — OXYCODONES,MS,WB/SP RFX
Oxycocone: 6.7 ng/mL
Oxycodones Confirmation: POSITIVE
Oxymorphone: NEGATIVE ng/mL

## 2023-04-05 ENCOUNTER — Other Ambulatory Visit: Payer: Self-pay

## 2023-04-07 ENCOUNTER — Other Ambulatory Visit (HOSPITAL_COMMUNITY): Payer: Self-pay

## 2023-04-08 ENCOUNTER — Other Ambulatory Visit: Payer: Self-pay

## 2023-04-08 ENCOUNTER — Other Ambulatory Visit (HOSPITAL_COMMUNITY): Payer: Self-pay

## 2023-04-12 ENCOUNTER — Other Ambulatory Visit: Payer: Self-pay | Admitting: Family Medicine

## 2023-04-12 DIAGNOSIS — M79671 Pain in right foot: Secondary | ICD-10-CM

## 2023-04-13 ENCOUNTER — Other Ambulatory Visit: Payer: Self-pay

## 2023-04-13 ENCOUNTER — Encounter: Payer: Self-pay | Admitting: Pharmacist

## 2023-04-13 ENCOUNTER — Other Ambulatory Visit (HOSPITAL_COMMUNITY): Payer: Self-pay

## 2023-04-13 MED ORDER — GABAPENTIN 300 MG PO CAPS
300.0000 mg | ORAL_CAPSULE | Freq: Three times a day (TID) | ORAL | 2 refills | Status: DC
Start: 2023-04-13 — End: 2023-08-08
  Filled 2023-04-13 (×2): qty 90, 30d supply, fill #0
  Filled 2023-06-14: qty 90, 30d supply, fill #1
  Filled 2023-07-08 – 2023-07-11 (×2): qty 90, 30d supply, fill #2

## 2023-04-15 ENCOUNTER — Other Ambulatory Visit (HOSPITAL_COMMUNITY): Payer: Self-pay

## 2023-04-18 ENCOUNTER — Other Ambulatory Visit (HOSPITAL_COMMUNITY): Payer: Self-pay

## 2023-04-18 ENCOUNTER — Other Ambulatory Visit: Payer: Self-pay | Admitting: Family Medicine

## 2023-04-18 DIAGNOSIS — F418 Other specified anxiety disorders: Secondary | ICD-10-CM

## 2023-04-20 MED ORDER — ESCITALOPRAM OXALATE 10 MG PO TABS
20.0000 mg | ORAL_TABLET | Freq: Every day | ORAL | 1 refills | Status: DC
Start: 2023-04-20 — End: 2023-06-14
  Filled 2023-04-20: qty 60, 30d supply, fill #0
  Filled 2023-05-17: qty 60, 30d supply, fill #1

## 2023-04-21 ENCOUNTER — Other Ambulatory Visit: Payer: Self-pay

## 2023-04-21 ENCOUNTER — Other Ambulatory Visit (HOSPITAL_COMMUNITY): Payer: Self-pay

## 2023-04-25 ENCOUNTER — Other Ambulatory Visit: Payer: Self-pay

## 2023-04-27 ENCOUNTER — Other Ambulatory Visit: Payer: Self-pay

## 2023-04-28 ENCOUNTER — Other Ambulatory Visit (HOSPITAL_BASED_OUTPATIENT_CLINIC_OR_DEPARTMENT_OTHER): Payer: Self-pay

## 2023-04-28 ENCOUNTER — Other Ambulatory Visit (HOSPITAL_COMMUNITY): Payer: Self-pay

## 2023-04-28 DIAGNOSIS — M25561 Pain in right knee: Secondary | ICD-10-CM | POA: Diagnosis not present

## 2023-04-28 DIAGNOSIS — M79661 Pain in right lower leg: Secondary | ICD-10-CM | POA: Diagnosis not present

## 2023-05-04 ENCOUNTER — Other Ambulatory Visit (HOSPITAL_COMMUNITY): Payer: Self-pay

## 2023-05-05 ENCOUNTER — Other Ambulatory Visit (HOSPITAL_COMMUNITY): Payer: Self-pay

## 2023-05-05 DIAGNOSIS — M79661 Pain in right lower leg: Secondary | ICD-10-CM | POA: Diagnosis not present

## 2023-05-05 DIAGNOSIS — M25561 Pain in right knee: Secondary | ICD-10-CM | POA: Diagnosis not present

## 2023-05-11 ENCOUNTER — Other Ambulatory Visit (HOSPITAL_COMMUNITY): Payer: Self-pay

## 2023-05-14 ENCOUNTER — Other Ambulatory Visit (HOSPITAL_COMMUNITY): Payer: Self-pay

## 2023-05-15 ENCOUNTER — Other Ambulatory Visit: Payer: Self-pay

## 2023-05-15 ENCOUNTER — Other Ambulatory Visit (HOSPITAL_COMMUNITY): Payer: Self-pay

## 2023-05-18 ENCOUNTER — Other Ambulatory Visit (HOSPITAL_COMMUNITY): Payer: Self-pay

## 2023-05-18 ENCOUNTER — Other Ambulatory Visit: Payer: Self-pay

## 2023-05-26 ENCOUNTER — Emergency Department (HOSPITAL_BASED_OUTPATIENT_CLINIC_OR_DEPARTMENT_OTHER)
Admission: EM | Admit: 2023-05-26 | Discharge: 2023-05-26 | Disposition: A | Discharge status: Home / Self Care | Payer: Commercial Managed Care - PPO | Attending: Emergency Medicine | Admitting: Emergency Medicine

## 2023-05-26 ENCOUNTER — Emergency Department (HOSPITAL_BASED_OUTPATIENT_CLINIC_OR_DEPARTMENT_OTHER): Payer: Commercial Managed Care - PPO | Admitting: Radiology

## 2023-05-26 ENCOUNTER — Encounter (HOSPITAL_BASED_OUTPATIENT_CLINIC_OR_DEPARTMENT_OTHER): Payer: Self-pay

## 2023-05-26 ENCOUNTER — Other Ambulatory Visit: Payer: Self-pay

## 2023-05-26 DIAGNOSIS — X501XXA Overexertion from prolonged static or awkward postures, initial encounter: Secondary | ICD-10-CM | POA: Diagnosis not present

## 2023-05-26 DIAGNOSIS — S93491A Sprain of other ligament of right ankle, initial encounter: Secondary | ICD-10-CM | POA: Diagnosis not present

## 2023-05-26 DIAGNOSIS — Y9301 Activity, walking, marching and hiking: Secondary | ICD-10-CM | POA: Insufficient documentation

## 2023-05-26 DIAGNOSIS — S82301D Unspecified fracture of lower end of right tibia, subsequent encounter for closed fracture with routine healing: Secondary | ICD-10-CM | POA: Diagnosis not present

## 2023-05-26 DIAGNOSIS — S93401A Sprain of unspecified ligament of right ankle, initial encounter: Secondary | ICD-10-CM | POA: Diagnosis not present

## 2023-05-26 DIAGNOSIS — Z7901 Long term (current) use of anticoagulants: Secondary | ICD-10-CM | POA: Insufficient documentation

## 2023-05-26 DIAGNOSIS — S82231D Displaced oblique fracture of shaft of right tibia, subsequent encounter for closed fracture with routine healing: Secondary | ICD-10-CM | POA: Diagnosis not present

## 2023-05-26 DIAGNOSIS — S82201D Unspecified fracture of shaft of right tibia, subsequent encounter for closed fracture with routine healing: Secondary | ICD-10-CM | POA: Diagnosis not present

## 2023-05-26 DIAGNOSIS — M25571 Pain in right ankle and joints of right foot: Secondary | ICD-10-CM | POA: Diagnosis present

## 2023-05-26 NOTE — ED Provider Notes (Signed)
[place] EMERGENCY DEPARTMENT AT [facility]  Provider Note  CSN: [id number] Arrival date & time: 05/26/23 0156  History Chief Complaint  Patient presents with   Leg Injury    Alan Sanders is a 52 y.o. male who is approx 2 months s[/p ORIF of R tib/fib fracture reports he stumbled while walking tonight and twisted his R ankle. Denies any other injuries. Complaining of pain over his distal leg and ankle.    Home Medications Prior to Admission medications   Medication Sig Start Date End Date Taking? Authorizing Provider  albuterol (VENTOLIN HFA) 108 (90 Base) MCG/ACT inhaler Inhale 1-2 puffs into the lungs every 6 (six) hours as needed for wheezing or shortness of breath. Patient taking differently: Inhale 2-3 puffs into the lungs as needed for wheezing or shortness of breath. 07/18/22   Loyola Mast, MD  apixaban (ELIQUIS) 2.5 MG TABS tablet Take 1 tablet (2.5 mg total) by mouth 2 (two) times daily. 03/26/23 04/25/23  Montez Morita, PA-C  ascorbic acid (VITAMIN C) 1000 MG tablet Take 1 tablet (1,000 mg total) by mouth daily. 03/27/23   Montez Morita, PA-C  chlorhexidine (HIBICLENS) 4 % external liquid Apply 15 mLs (1 Application total) topically as directed for 30 doses. Use as directed daily for 5 days every other week for 6 weeks. 03/23/23   Montez Morita, PA-C  Cholecalciferol 125 MCG (5000 UT) TABS Take 1 tablet (5,000 Units total) by mouth daily. 03/26/23   Montez Morita, PA-C  docusate sodium (COLACE) 100 MG capsule Take 1 capsule (100 mg total) by mouth 2 (two) times daily. 03/26/23   Montez Morita, PA-C  escitalopram (LEXAPRO) 10 MG tablet Take 2 tablets (20 mg total) by mouth daily. 04/20/23   Mliss Sax, MD  gabapentin (NEURONTIN) 300 MG capsule Take 1 capsule (300 mg total) by mouth 3 (three) times daily. 04/13/23   Mliss Sax, MD  HYDROcodone-acetaminophen (NORCO) 7.5-325 MG tablet Take 1-2 tablets by mouth every 6 (six) hours as needed for severe pain or  moderate pain. 03/26/23   Montez Morita, PA-C  methocarbamol (ROBAXIN) 750 MG tablet Take 1 tablet (750 mg total) by mouth 3 (three) times daily as needed. 02/26/23     pantoprazole (PROTONIX) 40 MG tablet Take 1 tablet (40 mg total) by mouth daily. 02/23/23 03/25/23  Jenel Lucks, MD  Vitamin D, Ergocalciferol, (DRISDOL) 1.25 MG (50000 UNIT) CAPS capsule Take 1 capsule (50,000 Units total) by mouth every 7 (seven) days. 03/26/23   Montez Morita, PA-C     Allergies    Hydromorphone   Review of Systems   Review of Systems Please see HPI for pertinent positives and negatives  Physical Exam BP (!) 132/90 (BP Location: Left Arm)   Pulse (!) 105   Temp 98.2 F (36.8 C)   Resp 17   Ht 6\' 1"  (1.854 m)   Wt 104.3 kg   SpO2 97%   BMI 30.34 kg/m   Physical Exam Vitals and nursing note reviewed.  HENT:     Head: Normocephalic.     Nose: Nose normal.  Eyes:     Extraocular Movements: Extraocular movements intact.  Pulmonary:     Effort: Pulmonary effort is normal.  Musculoskeletal:        General: Tenderness (medial and lateral malleolus, mild soft tissue swelling) present. No deformity. Normal range of motion.     Cervical back: Neck supple.  Skin:    Findings: No rash (on exposed skin).  Neurological:     Mental Status: He is alert and oriented to person, place, and time.  Psychiatric:        Mood and Affect: Mood normal.     ED Results / Procedures / Treatments   EKG None  Procedures Procedures  Medications Ordered in the ED Medications - No data to display  Initial Impression and Plan  Patient here for evaluation after twisting his ankle, about 2 months since ORIF of that leg. I personally viewed the images from radiology studies and agree with radiologist interpretation: Xrays show healing from his prior fracture, no new fracture or hardware issues. Suspect an ankle sprain. Plan ASO, crutches for comfort. Follow up with Ortho as scheduled next week.     ED Course        MDM Rules/Calculators/A&P Medical Decision Making Problems Addressed: Sprain of right ankle, unspecified ligament, initial encounter: acute illness or injury  Amount and/or Complexity of Data Reviewed Radiology: ordered and independent interpretation performed. Decision-making details documented in ED Course.     Final Clinical Impression(s) / ED Diagnoses Final diagnoses:  Sprain of right ankle, unspecified ligament, initial encounter    Rx / DC Orders ED Discharge Orders     None        Pollyann Savoy, MD 05/26/23 [phone]

## 2023-05-26 NOTE — ED Triage Notes (Signed)
Patient arrives to ED POV C/O RT foot pain. Pt states that around 8pm he was walking down a ramp and twisted his foot and has been in pain since. Hx of RT foot Fx and surgery.

## 2023-05-27 DIAGNOSIS — M25561 Pain in right knee: Secondary | ICD-10-CM | POA: Diagnosis not present

## 2023-05-27 DIAGNOSIS — M79661 Pain in right lower leg: Secondary | ICD-10-CM | POA: Diagnosis not present

## 2023-06-03 DIAGNOSIS — S82201D Unspecified fracture of shaft of right tibia, subsequent encounter for closed fracture with routine healing: Secondary | ICD-10-CM | POA: Diagnosis not present

## 2023-06-14 ENCOUNTER — Other Ambulatory Visit: Payer: Self-pay | Admitting: Family Medicine

## 2023-06-14 ENCOUNTER — Other Ambulatory Visit (HOSPITAL_COMMUNITY): Payer: Self-pay

## 2023-06-14 DIAGNOSIS — F5105 Insomnia due to other mental disorder: Secondary | ICD-10-CM

## 2023-06-15 ENCOUNTER — Other Ambulatory Visit: Payer: Self-pay

## 2023-06-15 ENCOUNTER — Other Ambulatory Visit (HOSPITAL_COMMUNITY): Payer: Self-pay

## 2023-06-15 MED ORDER — ESCITALOPRAM OXALATE 10 MG PO TABS
20.0000 mg | ORAL_TABLET | Freq: Every day | ORAL | 1 refills | Status: DC
Start: 2023-06-15 — End: 2024-03-01
  Filled 2023-06-15: qty 60, 30d supply, fill #0
  Filled 2023-08-08: qty 60, 30d supply, fill #1

## 2023-07-04 ENCOUNTER — Other Ambulatory Visit (HOSPITAL_COMMUNITY): Payer: Self-pay

## 2023-07-08 ENCOUNTER — Other Ambulatory Visit: Payer: Self-pay

## 2023-07-08 ENCOUNTER — Other Ambulatory Visit (HOSPITAL_COMMUNITY): Payer: Self-pay

## 2023-07-08 DIAGNOSIS — S82201D Unspecified fracture of shaft of right tibia, subsequent encounter for closed fracture with routine healing: Secondary | ICD-10-CM | POA: Diagnosis not present

## 2023-07-09 ENCOUNTER — Other Ambulatory Visit: Payer: Self-pay

## 2023-07-11 ENCOUNTER — Other Ambulatory Visit (HOSPITAL_COMMUNITY): Payer: Self-pay

## 2023-07-13 ENCOUNTER — Other Ambulatory Visit (HOSPITAL_COMMUNITY): Payer: Self-pay

## 2023-07-13 ENCOUNTER — Other Ambulatory Visit: Payer: Self-pay

## 2023-07-14 ENCOUNTER — Other Ambulatory Visit: Payer: Self-pay

## 2023-07-16 ENCOUNTER — Other Ambulatory Visit (HOSPITAL_COMMUNITY): Payer: Self-pay

## 2023-07-21 ENCOUNTER — Other Ambulatory Visit (HOSPITAL_COMMUNITY): Payer: Self-pay

## 2023-07-30 ENCOUNTER — Other Ambulatory Visit (HOSPITAL_COMMUNITY): Payer: Self-pay

## 2023-08-04 ENCOUNTER — Ambulatory Visit: Payer: Commercial Managed Care - PPO | Admitting: Family Medicine

## 2023-08-04 ENCOUNTER — Telehealth: Payer: Self-pay | Admitting: Family Medicine

## 2023-08-04 NOTE — Telephone Encounter (Signed)
Pt was a no show for an OV with Dr. Doreene Burke on 08/04/23, I sent a letter.

## 2023-08-05 NOTE — Telephone Encounter (Signed)
1st no show w/in 12 months, letter sent via mail

## 2023-08-08 ENCOUNTER — Other Ambulatory Visit (HOSPITAL_COMMUNITY): Payer: Self-pay

## 2023-08-08 ENCOUNTER — Other Ambulatory Visit: Payer: Self-pay | Admitting: Family Medicine

## 2023-08-08 DIAGNOSIS — M79672 Pain in left foot: Secondary | ICD-10-CM

## 2023-08-10 ENCOUNTER — Other Ambulatory Visit: Payer: Self-pay

## 2023-08-10 ENCOUNTER — Other Ambulatory Visit (HOSPITAL_COMMUNITY): Payer: Self-pay

## 2023-08-10 MED ORDER — GABAPENTIN 300 MG PO CAPS
300.0000 mg | ORAL_CAPSULE | Freq: Three times a day (TID) | ORAL | 2 refills | Status: DC
Start: 2023-08-10 — End: 2024-03-01
  Filled 2023-08-10: qty 90, 30d supply, fill #0
  Filled 2023-09-02: qty 90, 30d supply, fill #1
  Filled 2023-10-08 – 2023-10-12 (×2): qty 90, 30d supply, fill #2

## 2023-08-12 ENCOUNTER — Other Ambulatory Visit (HOSPITAL_COMMUNITY): Payer: Self-pay

## 2023-08-15 ENCOUNTER — Other Ambulatory Visit (HOSPITAL_COMMUNITY): Payer: Self-pay

## 2023-08-15 ENCOUNTER — Encounter (HOSPITAL_COMMUNITY): Payer: Self-pay | Admitting: Emergency Medicine

## 2023-08-15 ENCOUNTER — Emergency Department (HOSPITAL_COMMUNITY): Payer: Commercial Managed Care - PPO

## 2023-08-15 ENCOUNTER — Other Ambulatory Visit: Payer: Self-pay

## 2023-08-15 ENCOUNTER — Emergency Department (HOSPITAL_COMMUNITY)
Admission: EM | Admit: 2023-08-15 | Discharge: 2023-08-15 | Disposition: A | Discharge status: Home / Self Care | Payer: Commercial Managed Care - PPO | Attending: Emergency Medicine | Admitting: Emergency Medicine

## 2023-08-15 DIAGNOSIS — M25519 Pain in unspecified shoulder: Secondary | ICD-10-CM | POA: Diagnosis not present

## 2023-08-15 DIAGNOSIS — J45909 Unspecified asthma, uncomplicated: Secondary | ICD-10-CM | POA: Diagnosis not present

## 2023-08-15 DIAGNOSIS — M25511 Pain in right shoulder: Secondary | ICD-10-CM

## 2023-08-15 DIAGNOSIS — T148XXA Other injury of unspecified body region, initial encounter: Secondary | ICD-10-CM

## 2023-08-15 DIAGNOSIS — R519 Headache, unspecified: Secondary | ICD-10-CM | POA: Diagnosis not present

## 2023-08-15 DIAGNOSIS — Z981 Arthrodesis status: Secondary | ICD-10-CM | POA: Diagnosis not present

## 2023-08-15 DIAGNOSIS — W19XXXA Unspecified fall, initial encounter: Secondary | ICD-10-CM | POA: Diagnosis not present

## 2023-08-15 DIAGNOSIS — S199XXA Unspecified injury of neck, initial encounter: Secondary | ICD-10-CM | POA: Diagnosis not present

## 2023-08-15 DIAGNOSIS — S46911A Strain of unspecified muscle, fascia and tendon at shoulder and upper arm level, right arm, initial encounter: Secondary | ICD-10-CM | POA: Insufficient documentation

## 2023-08-15 DIAGNOSIS — G4489 Other headache syndrome: Secondary | ICD-10-CM | POA: Diagnosis not present

## 2023-08-15 DIAGNOSIS — W010XXA Fall on same level from slipping, tripping and stumbling without subsequent striking against object, initial encounter: Secondary | ICD-10-CM | POA: Insufficient documentation

## 2023-08-15 DIAGNOSIS — Z7901 Long term (current) use of anticoagulants: Secondary | ICD-10-CM | POA: Diagnosis not present

## 2023-08-15 DIAGNOSIS — Y9289 Other specified places as the place of occurrence of the external cause: Secondary | ICD-10-CM | POA: Insufficient documentation

## 2023-08-15 DIAGNOSIS — S0990XA Unspecified injury of head, initial encounter: Secondary | ICD-10-CM | POA: Diagnosis not present

## 2023-08-15 DIAGNOSIS — R079 Chest pain, unspecified: Secondary | ICD-10-CM | POA: Diagnosis not present

## 2023-08-15 DIAGNOSIS — M542 Cervicalgia: Secondary | ICD-10-CM | POA: Diagnosis not present

## 2023-08-15 MED ORDER — METHOCARBAMOL 500 MG PO TABS: 500.0000 mg | ORAL_TABLET | Freq: Two times a day (BID) | ORAL | 0 refills | Status: AC

## 2023-08-15 MED ORDER — ACETAMINOPHEN 500 MG PO TABS
1000.0000 mg | ORAL_TABLET | Freq: Once | ORAL | Status: AC
  Administered 2023-08-15: 1000 mg via ORAL
  Filled 2023-08-15: qty 2

## 2023-08-15 MED ORDER — METHOCARBAMOL 500 MG PO TABS
500.0000 mg | ORAL_TABLET | Freq: Two times a day (BID) | ORAL | 0 refills | Status: DC
  Filled 2023-08-15: qty 6, 3d supply, fill #0

## 2023-08-15 NOTE — ED Provider Notes (Signed)
[place] EMERGENCY DEPARTMENT AT [facility] Provider Note   CSN: [id number] Arrival date & time: 08/15/23  1738     History {Add pertinent medical, surgical, social history, OB history to HPI:1} Chief Complaint  Patient presents with   Alan Sanders is a 52 y.o. male with PMHx anxietym arthritis, asthma, GERD, headache, seizures, thrombocytopenia who presents to ED concerned with mechanical fall. Patient stated that he tripped on a garden hose today and landed on his right side. Currently concerned for left head pain, left neck pain, and left shoulder pain. Also stating that he had non-bloody emesis x2 since fall.  Denies LOC, seizure, change in vision. Denies fever, chest pain, dyspnea, cough.   Fall       Home Medications Prior to Admission medications   Medication Sig Start Date End Date Taking? Authorizing Provider  albuterol (VENTOLIN HFA) 108 (90 Base) MCG/ACT inhaler Inhale 1-2 puffs into the lungs every 6 (six) hours as needed for wheezing or shortness of breath. Patient taking differently: Inhale 2-3 puffs into the lungs as needed for wheezing or shortness of breath. 07/18/22   Loyola Mast, MD  apixaban (ELIQUIS) 2.5 MG TABS tablet Take 1 tablet (2.5 mg total) by mouth 2 (two) times daily. 03/26/23 04/25/23  Montez Morita, PA-C  ascorbic acid (VITAMIN C) 1000 MG tablet Take 1 tablet (1,000 mg total) by mouth daily. 03/27/23   Montez Morita, PA-C  Cholecalciferol 125 MCG (5000 UT) TABS Take 1 tablet (5,000 Units total) by mouth daily. 03/26/23   Montez Morita, PA-C  docusate sodium (COLACE) 100 MG capsule Take 1 capsule (100 mg total) by mouth 2 (two) times daily. 03/26/23   Montez Morita, PA-C  escitalopram (LEXAPRO) 10 MG tablet Take 2 tablets (20 mg total) by mouth daily. 06/15/23   Mliss Sax, MD  gabapentin (NEURONTIN) 300 MG capsule Take 1 capsule (300 mg total) by mouth 3 (three) times daily. 08/10/23   Mliss Sax, MD   HYDROcodone-acetaminophen (NORCO) 7.5-325 MG tablet Take 1-2 tablets by mouth every 6 (six) hours as needed for severe pain or moderate pain. 03/26/23   Montez Morita, PA-C  methocarbamol (ROBAXIN) 750 MG tablet Take 1 tablet (750 mg total) by mouth 3 (three) times daily as needed. 02/26/23     pantoprazole (PROTONIX) 40 MG tablet Take 1 tablet (40 mg total) by mouth daily. 02/23/23 03/25/23  Jenel Lucks, MD  Vitamin D, Ergocalciferol, (DRISDOL) 1.25 MG (50000 UNIT) CAPS capsule Take 1 capsule (50,000 Units total) by mouth every 7 (seven) days. 03/26/23   Montez Morita, PA-C      Allergies    Hydromorphone    Review of Systems   Review of Systems  Musculoskeletal:  Positive for neck pain.    Physical Exam Updated Vital Signs BP 130/82 (BP Location: Left Arm)   Pulse 92   Temp 97.8 F (36.6 C) (Oral)   Resp (!) 24   Ht 6\' 1"  (1.854 m)   Wt 104.3 kg   SpO2 98%   BMI 30.34 kg/m  Physical Exam Vitals and nursing note reviewed.  Constitutional:      General: He is not in acute distress.    Appearance: He is not ill-appearing or toxic-appearing.  HENT:     Head: Normocephalic and atraumatic.     Mouth/Throat:     Mouth: Mucous membranes are moist.  Eyes:     General: No scleral icterus.  Right eye: No discharge.        Left eye: No discharge.     Conjunctiva/sclera: Conjunctivae normal.  Cardiovascular:     Rate and Rhythm: Normal rate and regular rhythm.     Pulses: Normal pulses.     Heart sounds: Normal heart sounds. No murmur heard. Pulmonary:     Effort: Pulmonary effort is normal. No respiratory distress.     Breath sounds: Normal breath sounds. No wheezing, rhonchi or rales.  Abdominal:     General: Abdomen is flat. Bowel sounds are normal. There is no distension.     Palpations: Abdomen is soft. There is no mass.     Tenderness: There is no abdominal tenderness.  Musculoskeletal:     Right lower leg: No edema.     Left lower leg: No edema.     Comments: No  tenderness to BL feet, ankles, knees, hips, wrists, elbows. ROM of right shoulder unrestricted.  Skin:    General: Skin is warm and dry.     Findings: No rash.  Neurological:     General: No focal deficit present.     Mental Status: He is alert and oriented to person, place, and time. Mental status is at baseline.     Comments: GCS 15. Speech is goal oriented. No deficits appreciated to CN III-XII; symmetric eyebrow raise, no facial drooping, tongue midline. Patient has equal grip strength bilaterally with 5/5 strength against resistance in all major muscle groups bilaterally. Sensation to light touch intact. Patient moves extremities without ataxia. Patient ambulatory with steady gait.   Psychiatric:        Mood and Affect: Mood normal.        Behavior: Behavior normal.     ED Results / Procedures / Treatments   Labs (all labs ordered are listed, but only abnormal results are displayed) Labs Reviewed - No data to display  EKG None  Radiology No results found.  Procedures Procedures  {Document cardiac monitor, telemetry assessment procedure when appropriate:1}  Medications Ordered in ED Medications  acetaminophen (TYLENOL) tablet 1,000 mg (1,000 mg Oral Given 08/15/23 1825)    ED Course/ Medical Decision Making/ A&P   {   Click here for ABCD2, HEART and other calculatorsREFRESH Note before signing :1}                              Medical Decision Making Amount and/or Complexity of Data Reviewed Radiology: ordered.  Risk OTC drugs.   This patient presents to the ED after a fall, this involves an extensive number of treatment options, and is a complaint that carries with it a high risk of complications and morbidity.  The differential diagnosis includes  intracranial hemorrhage, subdural/epidural hematoma, vertebral fracture, spinal cord injury, muscle strain, skull fracture, fracture.   Co morbidities that complicate the patient evaluation  ***   Additional  history obtained:  Additional history obtained from *** External records from outside source obtained and reviewed including ***   Lab Tests:  I Ordered, and personally interpreted labs.  The pertinent results include:  ***   Imaging Studies ordered:  I ordered imaging studies including ***  I independently visualized and interpreted imaging which showed *** I agree with the radiologist interpretation   Cardiac Monitoring: / EKG:  The patient was maintained on a cardiac monitor.  I personally viewed and interpreted the cardiac monitored which showed an underlying rhythm of: ***  Consultations Obtained:  I requested consultation with the ***,  and discussed lab and imaging findings as well as pertinent plan - they recommend: ***   Problem List / ED Course / Critical interventions / Medication management  Patient presented for Fall. Patient with stable vitals and does not appear to be in distress. Patient had an unremarkable physical exam and a score of 0 for the Nexus C-spine and Canadian head CT score and so imaging was not obtained at this time. Patient will be encouraged to follow-up with primary care provider to be reevaluated in the next few days.  I ordered medication including ***  for ***  Using the Canadian CT Score, I did not find it necessary to obtain a head CT due patient's age <65yo and due to lack of blood thinners, seizures, signs of skull fractures, retrograde amnesia, and vomiting. Using Nexus C-spine Score, I did not find it necessary to obtain imaging of C-spine due to lack of focal neurologic deficit, midline spinal tenderness, altered level of consciousness, intoxication, and distracting injury. Reevaluation of the patient after these medicines showed that the patient {resolved/improved/worsened:23923::"improved"} I have reviewed the patients home medicines and have made adjustments as needed Patient was given return precautions. Patient stable for discharge  at this time. Patient verbalized understanding of plan.   DDx: These are considered less likely due to history of present illness and physical exam findings -Intracranial hemorrhage, subdural/epidural hematoma: Canadian head CT score of 0, no neurodeficits -Vertebral fracture: No seatbelt sign, no midline tenderness, no step-off/crepitus/abnormalities palpated -Spinal cord injury: Nexus C-spine and Canadian head CT score of 0, no neurodeficits -Skull fracture: No postauricular ecchymosis, no periorbital ecchymosis, no hemotympanum -Fracture: No step-offs/crepitus/abnormalities palpated in head, neck, chest, upper extremities, lower extremities, pelvis   Social Determinants of Health:  none     {Document critical care time when appropriate:1} {Document review of labs and clinical decision tools ie heart score, Chads2Vasc2 etc:1}  {Document your independent review of radiology images, and any outside records:1} {Document your discussion with family members, caretakers, and with consultants:1} {Document social determinants of health affecting pt's care:1} {Document your decision making why or why not admission, treatments were needed:1} Final Clinical Impression(s) / ED Diagnoses Final diagnoses:  None    Rx / DC Orders ED Discharge Orders     None

## 2023-08-15 NOTE — Discharge Instructions (Addendum)
It was a pleasure caring for you today.  Imaging was reassuring.  I have sent prescription for muscle relaxers to your pharmacy.  Please follow-up with your primary care provider in the next couple days.  Seek emergency care if experiencing any new or worsening symptoms.

## 2023-08-15 NOTE — ED Notes (Signed)
Patient transported to CT 

## 2023-08-15 NOTE — ED Triage Notes (Signed)
[place] from standing , pt is c/o mostly right sided body pain,and right side neck pain

## 2023-08-15 NOTE — ED Notes (Signed)
Pt denies numbness and tingling. Pt able to move all extremities

## 2023-09-03 ENCOUNTER — Other Ambulatory Visit (HOSPITAL_COMMUNITY): Payer: Self-pay

## 2023-09-11 ENCOUNTER — Other Ambulatory Visit (HOSPITAL_COMMUNITY): Payer: Self-pay

## 2023-09-11 IMAGING — CR DG KNEE 1-2V*R*
2 series · 2 of 2 positions shown · non-contrast
Comparison: None Available.

CLINICAL DATA: Fall, right knee pain

EXAM:
RIGHT KNEE - 1-2 VIEW

[t knee ap right]
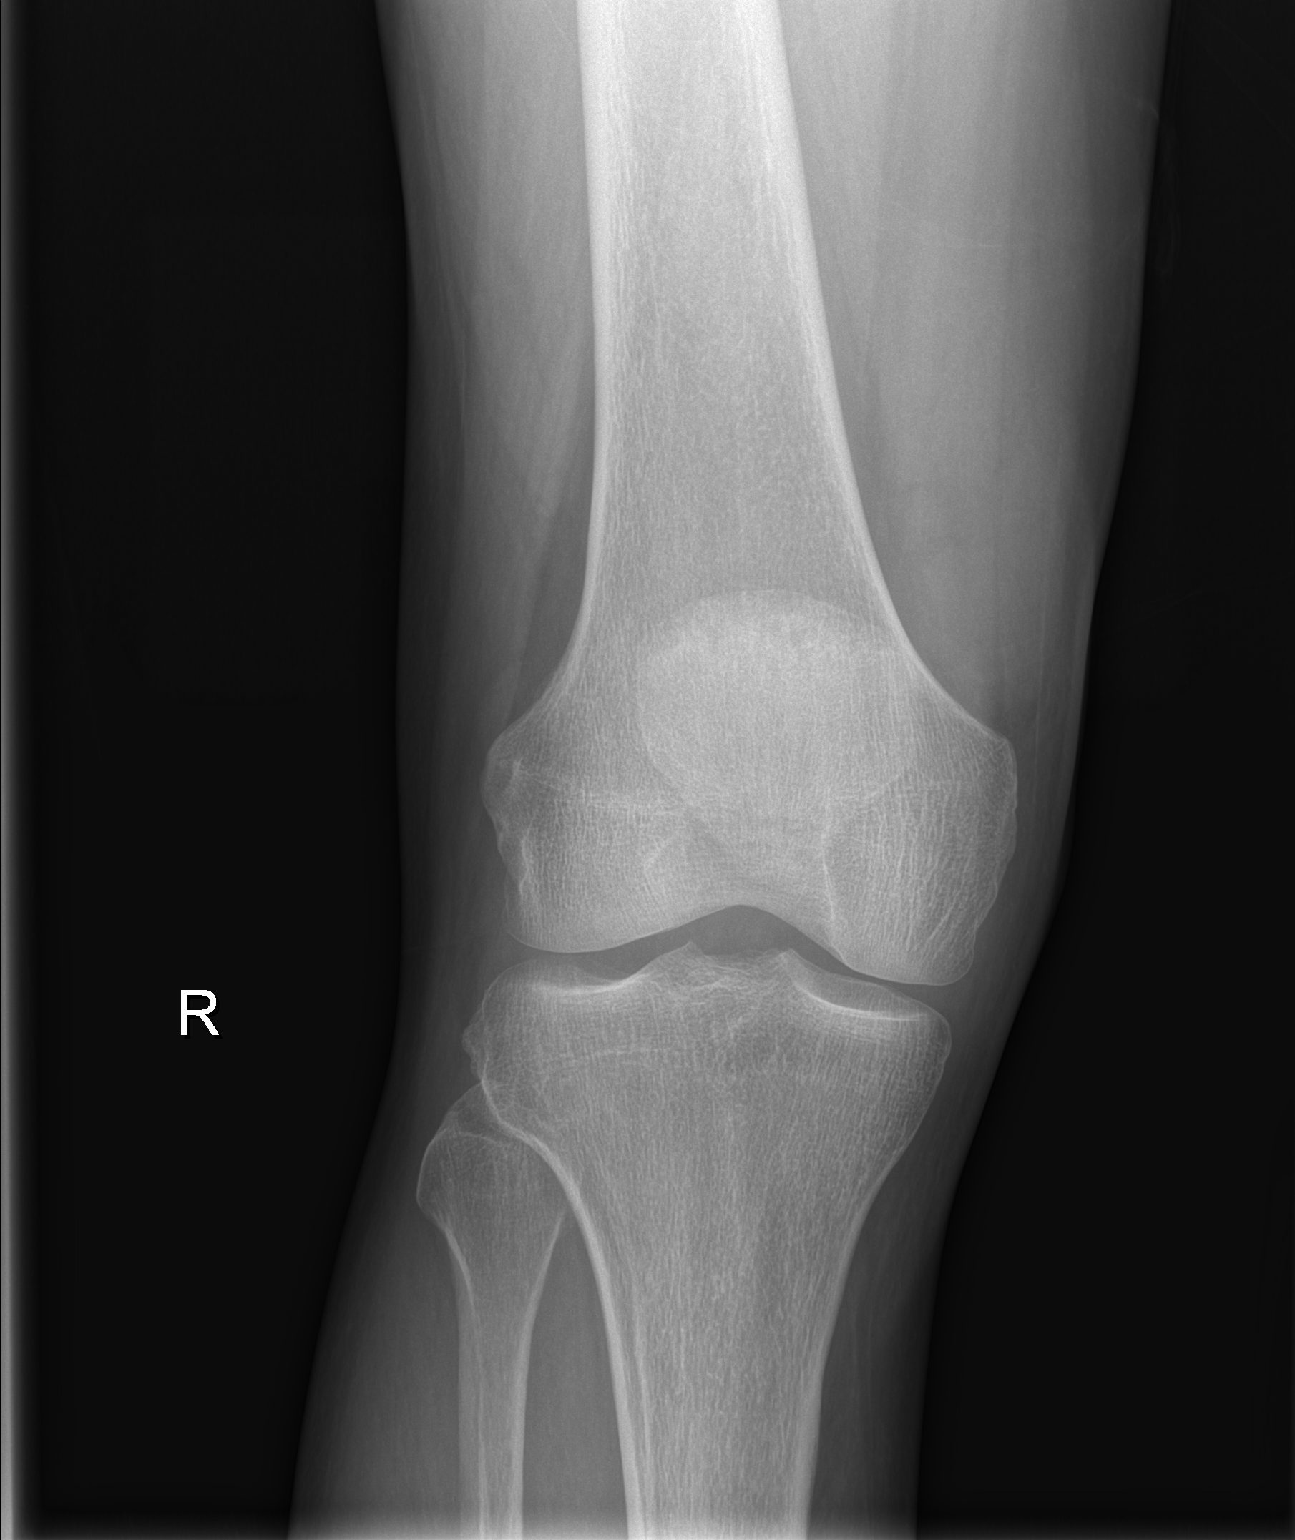

[t knee lat right]
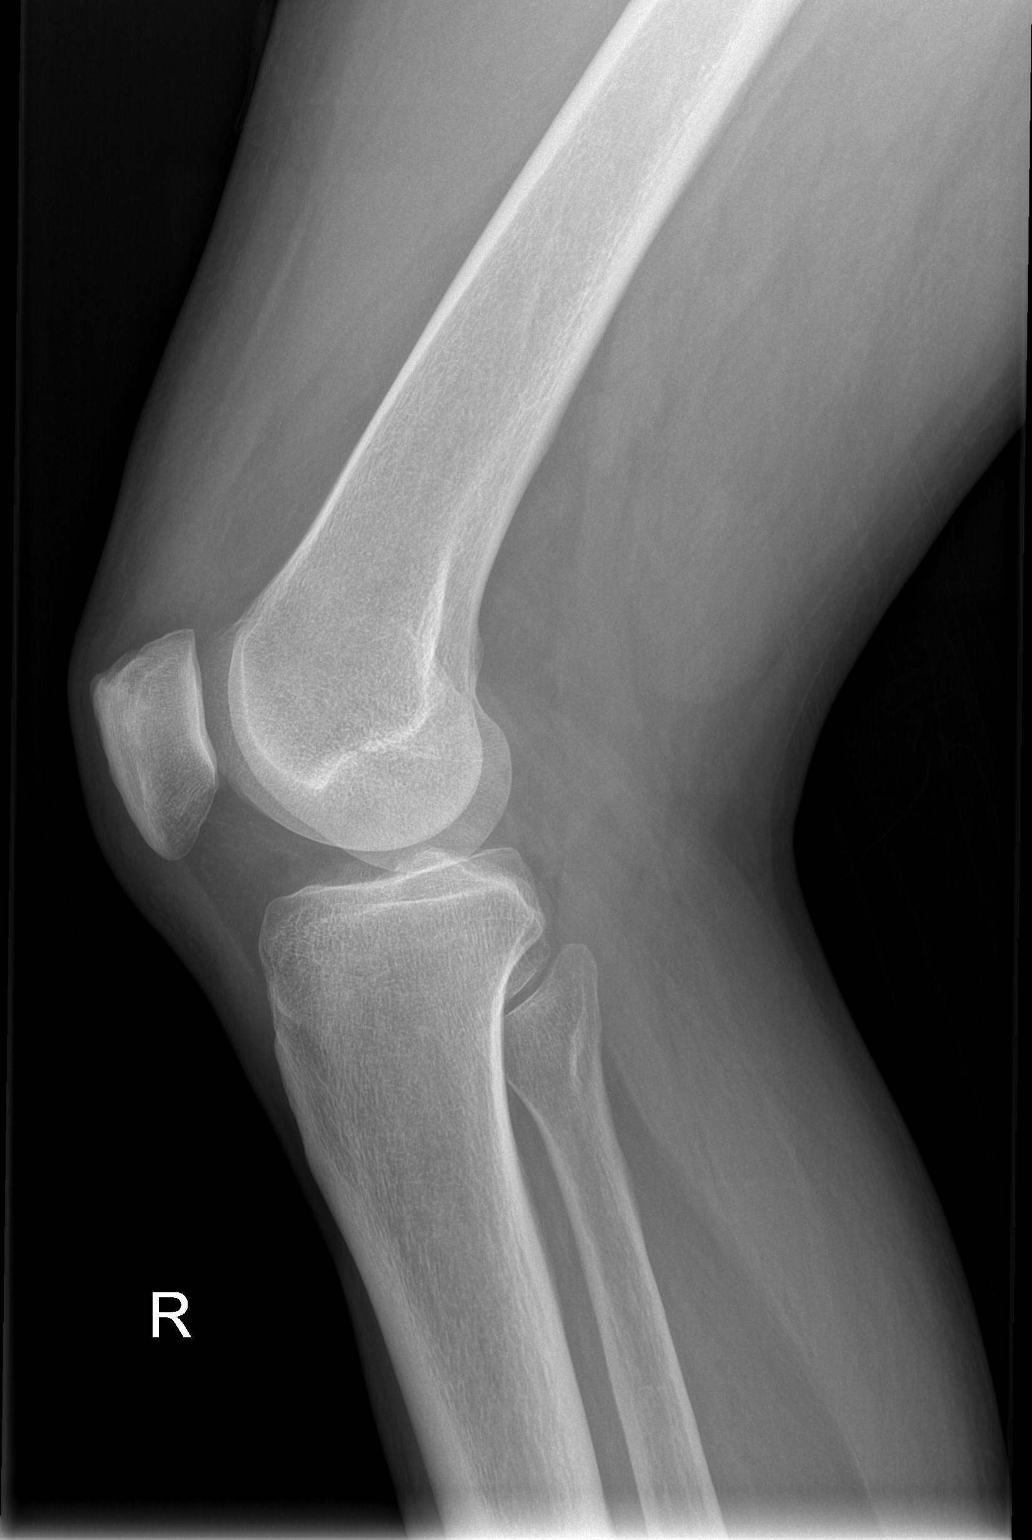

[2 of 2 positions shown; findings below may reference images not displayed]

FINDINGS: No evidence of fracture, dislocation, or joint effusion. No evidence
of arthropathy or other focal bone abnormality. Soft tissues are
unremarkable.
IMPRESSION: Negative.

## 2023-09-13 ENCOUNTER — Emergency Department (HOSPITAL_COMMUNITY): Payer: Worker's Compensation

## 2023-09-13 ENCOUNTER — Other Ambulatory Visit: Payer: Self-pay

## 2023-09-13 ENCOUNTER — Emergency Department (HOSPITAL_COMMUNITY)
Admission: EM | Admit: 2023-09-13 | Discharge: 2023-09-13 | Disposition: A | Discharge status: Home / Self Care | Payer: Worker's Compensation | Attending: Emergency Medicine | Admitting: Emergency Medicine

## 2023-09-13 ENCOUNTER — Emergency Department (HOSPITAL_COMMUNITY): Payer: Commercial Managed Care - PPO

## 2023-09-13 DIAGNOSIS — W19XXXA Unspecified fall, initial encounter: Secondary | ICD-10-CM | POA: Insufficient documentation

## 2023-09-13 DIAGNOSIS — M79661 Pain in right lower leg: Secondary | ICD-10-CM | POA: Insufficient documentation

## 2023-09-13 DIAGNOSIS — M79604 Pain in right leg: Secondary | ICD-10-CM

## 2023-09-13 DIAGNOSIS — Z7901 Long term (current) use of anticoagulants: Secondary | ICD-10-CM | POA: Diagnosis not present

## 2023-09-13 DIAGNOSIS — R0781 Pleurodynia: Secondary | ICD-10-CM | POA: Diagnosis not present

## 2023-09-13 DIAGNOSIS — F17200 Nicotine dependence, unspecified, uncomplicated: Secondary | ICD-10-CM | POA: Diagnosis not present

## 2023-09-13 DIAGNOSIS — J45909 Unspecified asthma, uncomplicated: Secondary | ICD-10-CM | POA: Diagnosis not present

## 2023-09-13 DIAGNOSIS — Y990 Civilian activity done for income or pay: Secondary | ICD-10-CM | POA: Diagnosis not present

## 2023-09-13 MED ORDER — OXYCODONE-ACETAMINOPHEN 5-325 MG PO TABS
1.0000 | ORAL_TABLET | Freq: Once | ORAL | Status: AC
  Administered 2023-09-13: 1 via ORAL
  Filled 2023-09-13: qty 1

## 2023-09-13 MED ORDER — KETOROLAC TROMETHAMINE 60 MG/2ML IM SOLN
30.0000 mg | Freq: Once | INTRAMUSCULAR | Status: AC
  Administered 2023-09-13: 30 mg via INTRAMUSCULAR
  Filled 2023-09-13: qty 2

## 2023-09-13 MED ORDER — ACETAMINOPHEN 325 MG PO TABS
650.0000 mg | ORAL_TABLET | Freq: Once | ORAL | Status: AC
  Administered 2023-09-13: 650 mg via ORAL
  Filled 2023-09-13: qty 2

## 2023-09-13 NOTE — ED Triage Notes (Signed)
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

## 2023-09-13 NOTE — ED Provider Notes (Signed)
[place] EMERGENCY DEPARTMENT AT [facility] Provider Note  CSN: [id number] Arrival date & time: 09/13/23 8657  Chief Complaint(s) Fall / Right Ribcage pain   HPI Alan Sanders is a 52 y.o. 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  Patient reports losing his balance while getting up from chair causing him to fall and hit his right chest on the ground.  He is endorsing severe right chest wall pain worse with breathing and palpation.  Patient also reporting right lower leg pain near the area where he recently had a tibial nailing surgery.  He denied any head trauma or loss of consciousness.  Endorses soreness in other areas but no significant pain.  Patient is not anticoagulated.  No other physical complaints.  HPI  Past Medical History Past Medical History:  Diagnosis Date   Anxiety    situational anxiety-on meds   Arthritis    neck/back/LEFT wrist   Asthma    PRN inhaler   Balance problem    Depression    on meds   Family history of colonic polyps 05/08/2022   Gait difficulty    GERD (gastroesophageal reflux disease)    OTC PRN meds   Headache    migraines   History of colonic polyps 05/08/2022   Inguinal hernia of left side without obstruction or gangrene    Pneumonia    Seasonal allergies    Seizures (HCC)    " its been along time ,since my last seizure "   Spleen enlarged    Testosterone deficiency in male 03/26/2023   Thrombocytopenia (HCC)    Tobacco abuse    Patient Active Problem List   Diagnosis Date Noted   Testosterone deficiency in male 03/26/2023   Nicotine dependence 03/24/2023   Vitamin D deficiency 03/24/2023   Closed right tibial fracture 03/22/2023   Dyspepsia 02/17/2023   Acute gastroenteropathy due to Norovirus 02/13/2023   Chronic diarrhea 02/13/2023   Intractable nausea and vomiting 02/11/2023   History of colonic polyps  01/22/2023   Loss of weight 01/22/2023   Genetic testing 05/19/2022   History of colonic polyps 05/08/2022   Family history of colonic polyps 05/08/2022   Need for shingles vaccine 02/27/2022   Screen for colon cancer 02/27/2022   Strain of lumbar region 02/27/2022   Peyronie's disease 02/27/2022   Pain in both feet 02/27/2022   Work related injury 12/10/2021   Insomnia secondary to depression with anxiety 05/13/2021   Cervical myelopathy (HCC) 04/16/2021   Seasonal allergic rhinitis due to pollen 02/18/2021   Dysthymia 02/18/2021   Cough 02/13/2015   Thrombocytopenia (HCC) 02/13/2015   Chest pain 02/12/2015   Asthma 02/12/2015   Tobacco abuse 02/12/2015   Nausea vomiting and diarrhea 02/12/2015   Pain in the chest    Home Medication(s) Prior to Admission medications   Medication Sig Start Date End Date Taking? Authorizing Provider  albuterol (VENTOLIN HFA) 108 (90 Base) MCG/ACT inhaler Inhale 1-2 puffs into the lungs every 6 (six) hours as needed for wheezing or shortness of breath. Patient taking differently: Inhale 2-3 puffs into the lungs as needed for wheezing or shortness of breath. 07/18/22   Loyola Mast, MD  apixaban (ELIQUIS) 2.5 MG TABS tablet Take 1 tablet (2.5 mg total) by mouth 2 (two) times daily. 03/26/23 04/25/23  Montez Morita, PA-C  ascorbic acid (VITAMIN C) 1000 MG tablet Take 1 tablet (1,000  mg total) by mouth daily. 03/27/23   Montez Morita, PA-C  Cholecalciferol 125 MCG (5000 UT) TABS Take 1 tablet (5,000 Units total) by mouth daily. 03/26/23   Montez Morita, PA-C  docusate sodium (COLACE) 100 MG capsule Take 1 capsule (100 mg total) by mouth 2 (two) times daily. 03/26/23   Montez Morita, PA-C  escitalopram (LEXAPRO) 10 MG tablet Take 2 tablets (20 mg total) by mouth daily. 06/15/23   Mliss Sax, MD  gabapentin (NEURONTIN) 300 MG capsule Take 1 capsule (300 mg total) by mouth 3 (three) times daily. 08/10/23   Mliss Sax, MD  HYDROcodone-acetaminophen  (NORCO) 7.5-325 MG tablet Take 1-2 tablets by mouth every 6 (six) hours as needed for severe pain or moderate pain. 03/26/23   Montez Morita, PA-C  pantoprazole (PROTONIX) 40 MG tablet Take 1 tablet (40 mg total) by mouth daily. 02/23/23 03/25/23  Jenel Lucks, MD  Vitamin D, Ergocalciferol, (DRISDOL) 1.25 MG (50000 UNIT) CAPS capsule Take 1 capsule (50,000 Units total) by mouth every 7 (seven) days. 03/26/23   Montez Morita, PA-C                                                                                                                                    Allergies Hydromorphone  Review of Systems Review of Systems As noted in HPI  Physical Exam Vital Signs  I have reviewed the triage vital signs BP (!) 132/91 (BP Location: Right Arm)   Pulse 74   Temp 98.2 F (36.8 C)   Resp 18   SpO2 95%   Physical Exam Vitals reviewed.  Constitutional:      General: He is not in acute distress.    Appearance: He is well-developed. He is not diaphoretic.  HENT:     Head: Normocephalic and atraumatic.     Right Ear: External ear normal.     Left Ear: External ear normal.     Nose: Nose normal.     Mouth/Throat:     Mouth: Mucous membranes are moist.  Eyes:     General: No scleral icterus.    Conjunctiva/sclera: Conjunctivae normal.  Neck:     Trachea: Phonation normal.  Cardiovascular:     Rate and Rhythm: Normal rate and regular rhythm.  Pulmonary:     Effort: Pulmonary effort is normal. No respiratory distress.     Breath sounds: No stridor.  Chest:     Chest wall: Tenderness present.    Abdominal:     General: There is no distension.  Musculoskeletal:        General: Normal range of motion.     Cervical back: Normal range of motion.     Right lower leg: Tenderness and bony tenderness present.       Legs:  Neurological:     Mental Status: He is alert and oriented to person, place, and time.  Psychiatric:  Behavior: Behavior normal.     ED Results and  Treatments Labs (all labs ordered are listed, but only abnormal results are displayed) Labs Reviewed - No data to display                                                                                                                       EKG  EKG Interpretation Date/Time:    Ventricular Rate:    PR Interval:    QRS Duration:    QT Interval:    QTC Calculation:   R Axis:      Text Interpretation:         Radiology DG Ribs Unilateral W/Chest Right  Result Date: 09/13/2023 CLINICAL DATA:  53 year old male with history of trauma from a fall out of a chair. Right-sided chest pain. EXAM: RIGHT RIBS AND CHEST - 3+ VIEW COMPARISON:  Chest x-ray 03/22/2023. FINDINGS: Lung volumes are low. Diffuse interstitial prominence and peribronchial cuffing, similar to the prior study, suggesting chronic bronchitis. No acute consolidative airspace disease. Small left pleural effusion. No right pleural effusion. No pneumothorax. No evidence of pulmonary edema. Heart size is normal. The patient is rotated to the left on today's exam, resulting in distortion of the mediastinal contours and reduced diagnostic sensitivity and specificity for mediastinal pathology. Dedicated views of the right ribs demonstrate no definite acute displaced right-sided rib fractures. IMPRESSION: 1. No acute displaced right-sided rib fractures. 2. The appearance of the lungs suggests chronic bronchitis, similar to prior studies, as above. 3. Aortic atherosclerosis. Electronically Signed   By: Trudie Reed M.D.   On: 09/13/2023 06:44   DG Knee Complete 4 Views Right  Result Date: 09/13/2023 CLINICAL DATA:  52 year old male with history of right-sided knee pain after falling out of a chair. EXAM: RIGHT KNEE - COMPLETE 4+ VIEW COMPARISON:  Right knee radiograph 03/22/2023. Right tibia/fibula 05/26/2023. FINDINGS: Three views of the right knee demonstrate postoperative changes of ORIF in the visualized tibia with a intramedullary rod  and screw fixation device noted. Healed fracture of the proximal third of the right fibular diaphysis is noted. No new acute displaced fracture, subluxation or dislocation is noted. IMPRESSION: 1. No acute radiographic abnormality of the right knee. Electronically Signed   By: Trudie Reed M.D.   On: 09/13/2023 06:42    Medications Ordered in ED Medications  oxyCODONE-acetaminophen (PERCOCET/ROXICET) 5-325 MG per tablet 1 tablet (1 tablet Oral Given 09/13/23 0430)  ketorolac (TORADOL) injection 30 mg (30 mg Intramuscular Given 09/13/23 0658)  acetaminophen (TYLENOL) tablet 650 mg (650 mg Oral Given 09/13/23 8676)   Procedures Procedures  (including critical care time) Medical Decision Making / ED Course   Medical Decision Making Amount and/or Complexity of Data Reviewed Radiology: ordered and independent interpretation performed. Decision-making details documented in ED Course.  Risk OTC drugs. Prescription drug management.    Mechanical fall resulting in right chest wall pain and right lower leg pain. In triage, plain films of the right ribs and right knee  were obtained.  He was also given a dose of Percocet. On my read of the plain films, I do not appreciate any acute injuries. Ordering plain film of the right tib-fib. Provided IM Toradol and additional Tylenol.  Awaiting plain film formal reading. Patient care turned over to oncoming provider. Patient case and results discussed in detail; please see their note for further ED managment.      Final Clinical Impression(s) / ED Diagnoses Final diagnoses:  Fall, initial encounter  Rib pain on right side  Right leg pain    This chart was dictated using voice recognition software.  Despite best efforts to proofread,  errors can occur which can change the documentation meaning.    Nira Conn, MD 09/13/23 [phone]

## 2023-09-13 NOTE — ED Provider Notes (Signed)
Patient signed out to me at 0700 by Dr. Eudelia Bunch pending x-ray of the tib-fib.  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  He had a knee and rib x-rays that showed no acute abnormality and was signed out to me pending tib-fib x-ray where most of his pain is over his right anterior shin.  His x-ray looks normal with no acute abnormality.  The patient will be given an Ace wrap for support and was recommended close primary care follow-up.   Rexford Maus, DO 09/13/23 [phone]

## 2023-09-13 NOTE — Discharge Instructions (Addendum)
Incentive spirometer: Use frequently to prevent pneumonia.  Recommended usage: 10 deep inhalations through spirometer every 2-3 hours for 7-10 days.  For pain control you may take 1000 mg of acetaminophen (Tylenol) every 8 hours and/or 600 mg of Ibuprofen (Motrin, Advil, etc.) every 6-8 hours as needed.  Please limit acetaminophen (Tylenol) to 4000 mg and Ibuprofen (Motrin, Advil, etc.) to 2400 mg for a 24hr period. Please note that other over-the-counter medicine may contain acetaminophen or ibuprofen as a component of their ingredients.

## 2023-09-21 ENCOUNTER — Other Ambulatory Visit: Payer: Self-pay

## 2023-09-26 ENCOUNTER — Encounter (HOSPITAL_BASED_OUTPATIENT_CLINIC_OR_DEPARTMENT_OTHER): Payer: Self-pay

## 2023-09-26 ENCOUNTER — Other Ambulatory Visit: Payer: Self-pay

## 2023-09-26 ENCOUNTER — Emergency Department (HOSPITAL_BASED_OUTPATIENT_CLINIC_OR_DEPARTMENT_OTHER)
Admission: EM | Admit: 2023-09-26 | Discharge: 2023-09-26 | Disposition: A | Discharge status: Home / Self Care | Payer: Commercial Managed Care - PPO | Attending: Emergency Medicine | Admitting: Emergency Medicine

## 2023-09-26 ENCOUNTER — Emergency Department (HOSPITAL_BASED_OUTPATIENT_CLINIC_OR_DEPARTMENT_OTHER): Payer: Commercial Managed Care - PPO

## 2023-09-26 ENCOUNTER — Other Ambulatory Visit (HOSPITAL_BASED_OUTPATIENT_CLINIC_OR_DEPARTMENT_OTHER): Payer: Self-pay

## 2023-09-26 DIAGNOSIS — R1084 Generalized abdominal pain: Secondary | ICD-10-CM | POA: Diagnosis not present

## 2023-09-26 DIAGNOSIS — R059 Cough, unspecified: Secondary | ICD-10-CM | POA: Diagnosis not present

## 2023-09-26 DIAGNOSIS — R112 Nausea with vomiting, unspecified: Secondary | ICD-10-CM | POA: Diagnosis not present

## 2023-09-26 DIAGNOSIS — R918 Other nonspecific abnormal finding of lung field: Secondary | ICD-10-CM | POA: Diagnosis not present

## 2023-09-26 DIAGNOSIS — B349 Viral infection, unspecified: Secondary | ICD-10-CM

## 2023-09-26 DIAGNOSIS — Z20822 Contact with and (suspected) exposure to covid-19: Secondary | ICD-10-CM | POA: Insufficient documentation

## 2023-09-26 DIAGNOSIS — R109 Unspecified abdominal pain: Secondary | ICD-10-CM | POA: Diagnosis not present

## 2023-09-26 DIAGNOSIS — R079 Chest pain, unspecified: Secondary | ICD-10-CM | POA: Diagnosis not present

## 2023-09-26 DIAGNOSIS — R0789 Other chest pain: Secondary | ICD-10-CM | POA: Diagnosis not present

## 2023-09-26 LAB — COMPREHENSIVE METABOLIC PANEL
ALT: 48 U/L — ABNORMAL HIGH (ref 0–44)
AST: 26 U/L (ref 15–41)
Albumin: 4.7 g/dL (ref 3.5–5.0)
Alkaline Phosphatase: 90 U/L (ref 38–126)
Anion gap: 12 (ref 5–15)
BUN: 13 mg/dL (ref 6–20)
CO2: 23 mmol/L (ref 22–32)
Calcium: 10 mg/dL (ref 8.9–10.3)
Chloride: 103 mmol/L (ref 98–111)
Creatinine, Ser: 0.73 mg/dL (ref 0.61–1.24)
GFR, Estimated: >60 mL/min (ref >60)
Glucose, Bld: 192 mg/dL — ABNORMAL HIGH (ref 70–99)
Potassium: 3.4 mmol/L — ABNORMAL LOW (ref 3.5–5.1)
Sodium: 138 mmol/L (ref 135–145)
Total Bilirubin: 0.5 mg/dL (ref 0.3–1.2)
Total Protein: 6.7 g/dL (ref 6.5–8.1)

## 2023-09-26 LAB — CBC WITH DIFFERENTIAL/PLATELET
Abs Immature Granulocytes: 0.03 10*3/uL (ref 0.00–0.07)
Basophils Absolute: 0 10*3/uL (ref 0.0–0.1)
Basophils Relative: 1 %
Eosinophils Absolute: 0.2 10*3/uL (ref 0.0–0.5)
Eosinophils Relative: 3 %
HCT: 42.6 % (ref 39.0–52.0)
Hemoglobin: 15.1 g/dL (ref 13.0–17.0)
Immature Granulocytes: 0 %
Lymphocytes Relative: 26 %
Lymphs Abs: 1.9 10*3/uL (ref 0.7–4.0)
MCH: 29 pg (ref 26.0–34.0)
MCHC: 35.4 g/dL (ref 30.0–36.0)
MCV: 81.9 fL (ref 80.0–100.0)
Monocytes Absolute: 0.5 10*3/uL (ref 0.1–1.0)
Monocytes Relative: 7 %
Neutro Abs: 4.5 10*3/uL (ref 1.7–7.7)
Neutrophils Relative %: 63 %
Platelets: 134 10*3/uL — ABNORMAL LOW (ref 150–400)
RBC: 5.2 MIL/uL (ref 4.22–5.81)
RDW: 14.3 % (ref 11.5–15.5)
WBC: 7.1 10*3/uL (ref 4.0–10.5)
nRBC: 0 % (ref 0.0–0.2)

## 2023-09-26 LAB — RESP PANEL BY RT-PCR (RSV, FLU A&B, COVID)  RVPGX2
Influenza A by PCR: NEGATIVE
Influenza B by PCR: NEGATIVE
Resp Syncytial Virus by PCR: NEGATIVE
SARS Coronavirus 2 by RT PCR: NEGATIVE

## 2023-09-26 LAB — LIPASE, BLOOD: Lipase: 40 U/L (ref 11–51)

## 2023-09-26 LAB — TROPONIN I (HIGH SENSITIVITY)
Troponin I (High Sensitivity): 3 ng/L (ref <18)
Troponin I (High Sensitivity): 3 ng/L (ref <18)

## 2023-09-26 MED ORDER — ONDANSETRON HCL 4 MG/2ML IJ SOLN
4.0000 mg | Freq: Once | INTRAMUSCULAR | Status: AC
  Administered 2023-09-26: 4 mg via INTRAVENOUS
  Filled 2023-09-26: qty 2

## 2023-09-26 MED ORDER — SODIUM CHLORIDE 0.9 % IV BOLUS
1000.0000 mL | Freq: Once | INTRAVENOUS | Status: AC
  Administered 2023-09-26: 1000 mL via INTRAVENOUS

## 2023-09-26 MED ORDER — MORPHINE SULFATE (PF) 4 MG/ML IV SOLN
4.0000 mg | Freq: Once | INTRAVENOUS | Status: AC
  Administered 2023-09-26: 4 mg via INTRAVENOUS
  Filled 2023-09-26: qty 1

## 2023-09-26 MED ORDER — ONDANSETRON 4 MG PO TBDP: 4.0000 mg | ORAL_TABLET | Freq: Three times a day (TID) | ORAL | 0 refills | Status: DC | PRN

## 2023-09-26 MED ORDER — KETOROLAC TROMETHAMINE 15 MG/ML IJ SOLN
15.0000 mg | Freq: Once | INTRAMUSCULAR | Status: AC
  Administered 2023-09-26: 15 mg via INTRAVENOUS
  Filled 2023-09-26: qty 1

## 2023-09-26 MED ORDER — POTASSIUM CHLORIDE CRYS ER 20 MEQ PO TBCR
20.0000 meq | EXTENDED_RELEASE_TABLET | Freq: Once | ORAL | Status: AC
  Administered 2023-09-26: 20 meq via ORAL
  Filled 2023-09-26: qty 1

## 2023-09-26 MED ORDER — ONDANSETRON 4 MG PO TBDP
4.0000 mg | ORAL_TABLET | Freq: Three times a day (TID) | ORAL | 0 refills | Status: DC | PRN
  Filled 2023-09-26: qty 20, 7d supply, fill #0

## 2023-09-26 NOTE — ED Notes (Signed)
Mr. Hagood ambulated around the nurses' station on room air.  His O2 sat=97% and HR=101, RR=22.  He complains of feeling light headed, and is slightly short of breath.

## 2023-09-26 NOTE — ED Provider Notes (Signed)
  Physical Exam  BP 126/87   Pulse (!) 103   Temp 98.2 F (36.8 C) (Oral)   Resp (!) 21   Ht 6\' 1"  (1.854 m)   Wt 103.9 kg   SpO2 98%   BMI 30.21 kg/m     Procedures  Procedures  ED Course / MDM    Medical Decision Making Amount and/or Complexity of Data Reviewed Labs: ordered. Radiology: ordered.  Risk Prescription drug management.   Patient presenting with nausea and vomiting and chest discomfort in the setting of 2 family members testing positive for COVID-19.  Patient symptoms been ongoing for the last 24 hours.  Chest x-ray was performed which revealed no acute abnormality, patient tolerating oral intake without discomfort.  Heart rate has improved with IV fluids.  EKG without ischemic changes, initial laboratory evaluation reassuring with normal cardiac troponin, low concern for ACS in the setting of the patient's presentation.  Ambulatory in the emergency room without desaturations on pulse oximetry.  Low concern for PE, Boerhaave syndrome, other acute intrathoracic abnormality. Covid and Flu resulted negative. Delta trop negayive. Suspect viral syndrome. Pt vitals stable on recheck, tolerating oral intake, symptomatically improved. Stable for DC with return precautions provided.       Ernie Avena, MD 09/26/23 [phone]

## 2023-09-26 NOTE — ED Notes (Signed)
Dc instructions reviewed with patient. Patient voiced understanding. Dc with belongings.  °

## 2023-09-26 NOTE — ED Triage Notes (Signed)
Complaining of chest pain, cough and abdominal pain that started last night. York Spaniel that his dad just was in the hospital for covid and he helped with his care.

## 2023-09-26 NOTE — ED Provider Notes (Signed)
[place] EMERGENCY DEPARTMENT AT [facility] Provider Note   CSN: [id number] Arrival date & time: 09/26/23  4098     History  Chief Complaint  Patient presents with   Chest Pain    Alan Sanders is a 52 y.o. male.  52 yo M with a chief complaints of nausea vomiting and then chest pain.  Started this evening.  Still feels a bit nauseated.  Still having chest pain afterwards.  Diffuse abdominal pain.  He has had 2 family members in the past week or so that have been diagnosed with COVID.  No cough or congestion.  No diarrhea.  No suspicious food intake.  No recent travel.   Chest Pain      Home Medications Prior to Admission medications   Medication Sig Start Date End Date Taking? Authorizing Provider  albuterol (VENTOLIN HFA) 108 (90 Base) MCG/ACT inhaler Inhale 1-2 puffs into the lungs every 6 (six) hours as needed for wheezing or shortness of breath. Patient taking differently: Inhale 2-3 puffs into the lungs as needed for wheezing or shortness of breath. 07/18/22   Loyola Mast, MD  apixaban (ELIQUIS) 2.5 MG TABS tablet Take 1 tablet (2.5 mg total) by mouth 2 (two) times daily. 03/26/23 04/25/23  Montez Morita, PA-C  ascorbic acid (VITAMIN C) 1000 MG tablet Take 1 tablet (1,000 mg total) by mouth daily. 03/27/23   Montez Morita, PA-C  Cholecalciferol 125 MCG (5000 UT) TABS Take 1 tablet (5,000 Units total) by mouth daily. 03/26/23   Montez Morita, PA-C  docusate sodium (COLACE) 100 MG capsule Take 1 capsule (100 mg total) by mouth 2 (two) times daily. 03/26/23   Montez Morita, PA-C  escitalopram (LEXAPRO) 10 MG tablet Take 2 tablets (20 mg total) by mouth daily. 06/15/23   Mliss Sax, MD  gabapentin (NEURONTIN) 300 MG capsule Take 1 capsule (300 mg total) by mouth 3 (three) times daily. 08/10/23   Mliss Sax, MD  HYDROcodone-acetaminophen (NORCO) 7.5-325 MG tablet Take 1-2 tablets by mouth every 6 (six) hours as needed for severe pain or moderate pain. 03/26/23    Montez Morita, PA-C  pantoprazole (PROTONIX) 40 MG tablet Take 1 tablet (40 mg total) by mouth daily. 02/23/23 03/25/23  Jenel Lucks, MD  Vitamin D, Ergocalciferol, (DRISDOL) 1.25 MG (50000 UNIT) CAPS capsule Take 1 capsule (50,000 Units total) by mouth every 7 (seven) days. 03/26/23   Montez Morita, PA-C      Allergies    Hydromorphone    Review of Systems   Review of Systems  Cardiovascular:  Positive for chest pain.    Physical Exam Updated Vital Signs BP (!) 145/101 (BP Location: Right Arm)   Pulse (!) 101   Temp 98.2 F (36.8 C) (Oral)   Resp 18   Ht 6\' 1"  (1.854 m)   Wt 103.9 kg   SpO2 98%   BMI 30.21 kg/m  Physical Exam Vitals and nursing note reviewed.  Constitutional:      Appearance: He is well-developed.  HENT:     Head: Normocephalic and atraumatic.  Eyes:     Pupils: Pupils are equal, round, and reactive to light.  Neck:     Vascular: No JVD.  Cardiovascular:     Rate and Rhythm: Normal rate and regular rhythm.     Heart sounds: No murmur heard.    No friction rub. No gallop.  Pulmonary:     Effort: No respiratory distress.     Breath sounds: No  wheezing.  Abdominal:     General: There is no distension.     Tenderness: There is no abdominal tenderness. There is no guarding or rebound.     Comments: Mild diffuse abdominal discomfort  Musculoskeletal:        General: Normal range of motion.     Cervical back: Normal range of motion and neck supple.  Skin:    Coloration: Skin is not pale.     Findings: No rash.  Neurological:     Mental Status: He is alert and oriented to person, place, and time.  Psychiatric:        Behavior: Behavior normal.     ED Results / Procedures / Treatments   Labs (all labs ordered are listed, but only abnormal results are displayed) Labs Reviewed  CBC WITH DIFFERENTIAL/PLATELET  COMPREHENSIVE METABOLIC PANEL  LIPASE, BLOOD  TROPONIN I (HIGH SENSITIVITY)    EKG None  Radiology No results  found.  Procedures Procedures    Medications Ordered in ED Medications  morphine (PF) 4 MG/ML injection 4 mg (4 mg Intravenous Given 09/26/23 0644)  ondansetron (ZOFRAN) injection 4 mg (4 mg Intravenous Given 09/26/23 0644)  sodium chloride 0.9 % bolus 1,000 mL (1,000 mLs Intravenous New Bag/Given 09/26/23 9147)    ED Course/ Medical Decision Making/ A&P                                 Medical Decision Making Amount and/or Complexity of Data Reviewed Labs: ordered. Radiology: ordered.  Risk Prescription drug management.   51 yo M with a chief complaints of nausea and vomiting and then developed some chest pain.  Started this evening.  Will treat pain and nausea.  Bolus of IV fluids.  Chest x-ray.  EKG without obvious ischemia.  Suspect chest pain is due to recurrent vomiting.  Seems unlikely to be Boerhaave syndrome based on history and physical.  Reassess.  CXR independently interpreted by me without focal infiltrate or ptx.   Patient care signed out to Dr. Karene Fry, please see their note for further details of care in the ED.  The patients results and plan were reviewed and discussed.   Any x-rays performed were independently reviewed by myself.   Differential diagnosis were considered with the presenting HPI.  Medications  morphine (PF) 4 MG/ML injection 4 mg (4 mg Intravenous Given 09/26/23 0644)  ondansetron (ZOFRAN) injection 4 mg (4 mg Intravenous Given 09/26/23 0644)  sodium chloride 0.9 % bolus 1,000 mL (1,000 mLs Intravenous New Bag/Given 09/26/23 0644)    Vitals:   09/26/23 0624 09/26/23 0625  BP: (!) 145/101   Pulse: (!) 101   Resp: 18   Temp: 98.2 F (36.8 C)   TempSrc: Oral   SpO2: 98%   Weight:  103.9 kg  Height:  6\' 1"  (1.854 m)    Final diagnoses:  Nausea and vomiting in adult    Admission/ observation were discussed with the admitting physician, patient and/or family and they are comfortable with the plan.           Final Clinical  Impression(s) / ED Diagnoses Final diagnoses:  Nausea and vomiting in adult    Rx / DC Orders ED Discharge Orders     None         Melene Plan, DO 09/26/23 8295

## 2023-09-26 NOTE — Discharge Instructions (Addendum)
Your symptoms are consistent with a viral syndrome. Covid and flu was negative, cardiac troponins were negative and remainder of labs and imaging was reassuring. Recommend supportive care at home, continued oral rehydration, Tylenol and Motrin for muscle aches and fever, Zofran for nausea. Return for any severe worsening symptoms.

## 2023-09-28 ENCOUNTER — Telehealth: Payer: Self-pay

## 2023-09-28 NOTE — Transitions of Care (Post Inpatient/ED Visit) (Unsigned)
   09/28/2023  Name: Alan Sanders MRN: [id number] DOB: 1971-09-17  Today's TOC FU Call Status: Today's TOC FU Call Status:: Unsuccessful Call (1st Attempt) Unsuccessful Call (1st Attempt) Date: 09/28/23  Attempted to reach the patient regarding the most recent Inpatient/ED visit.  Follow Up Plan: Additional outreach attempts will be made to reach the patient to complete the Transitions of Care (Post Inpatient/ED visit) call.   Signature Arvil Persons, BSN, Charity fundraiser

## 2023-09-29 NOTE — Transitions of Care (Post Inpatient/ED Visit) (Signed)
   09/29/2023  Name: Alan Sanders MRN: [id number] DOB: 1970-12-23  Today's TOC FU Call Status: Today's TOC FU Call Status:: Successful TOC FU Call Completed Unsuccessful Call (1st Attempt) Date: 09/28/23 [facility] FU Call Complete Date: 09/29/23 Patient's Name and Date of Birth confirmed.  Transition Care Management Follow-up Telephone Call Date of Discharge: 09/26/23 Discharge Facility: [facility] ([facility]) Type of Discharge: Emergency Department How have you been since you were released from the hospital?: Same Any questions or concerns?: No  Items Reviewed: Did you receive and understand the discharge instructions provided?: Yes Medications obtained,verified, and reconciled?: No Any new allergies since your discharge?: No Dietary orders reviewed?: NA Do you have support at home?: Yes People in Home: spouse  Medications Reviewed Today: Medications Reviewed Today   Medications were not reviewed in this encounter     Home Care and Equipment/Supplies: Were Home Health Services Ordered?: NA Any new equipment or medical supplies ordered?: NA  Functional Questionnaire: Do you need assistance with bathing/showering or dressing?: No Do you need assistance with meal preparation?: No Do you need assistance with eating?: No Do you have difficulty maintaining continence: No Do you need assistance with getting out of bed/getting out of a chair/moving?: No Do you have difficulty managing or taking your medications?: No  Follow up appointments reviewed: PCP Follow-up appointment confirmed?: NA Specialist Hospital Follow-up appointment confirmed?: NA Do you need transportation to your follow-up appointment?: No Do you understand care options if your condition(s) worsen?: Yes-patient verbalized understanding  Pt decline a follow up appt at this time.  SIGNATURE Arvil Persons, BSN, RN

## 2023-10-09 ENCOUNTER — Other Ambulatory Visit (HOSPITAL_COMMUNITY): Payer: Self-pay

## 2023-10-12 ENCOUNTER — Other Ambulatory Visit (HOSPITAL_COMMUNITY): Payer: Self-pay

## 2023-10-17 ENCOUNTER — Other Ambulatory Visit: Payer: Self-pay | Admitting: Family Medicine

## 2023-10-17 ENCOUNTER — Other Ambulatory Visit (HOSPITAL_COMMUNITY): Payer: Self-pay

## 2023-10-17 DIAGNOSIS — F418 Other specified anxiety disorders: Secondary | ICD-10-CM

## 2023-10-19 ENCOUNTER — Other Ambulatory Visit (HOSPITAL_COMMUNITY): Payer: Self-pay

## 2023-10-20 ENCOUNTER — Other Ambulatory Visit (HOSPITAL_COMMUNITY): Payer: Self-pay

## 2023-10-30 ENCOUNTER — Telehealth: Payer: Self-pay

## 2023-10-30 NOTE — Transitions of Care (Post Inpatient/ED Visit) (Unsigned)
   10/30/2023  Name: Alan Sanders MRN: [id number] DOB: 04/08/1971  Today's TOC FU Call Status: Today's TOC FU Call Status:: Unsuccessful Call (1st Attempt)  Attempted to reach the patient regarding the most recent Inpatient/ED visit.  Follow Up Plan: Additional outreach attempts will be made to reach the patient to complete the Transitions of Care (Post Inpatient/ED visit) call.   Signature Arvil Persons, BSN, Charity fundraiser

## 2023-11-03 ENCOUNTER — Emergency Department (HOSPITAL_COMMUNITY)
Admission: EM | Admit: 2023-11-03 | Discharge: 2023-11-04 | Disposition: A | Discharge status: Home / Self Care | Payer: Commercial Managed Care - PPO | Attending: Emergency Medicine | Admitting: Emergency Medicine

## 2023-11-03 ENCOUNTER — Other Ambulatory Visit: Payer: Self-pay

## 2023-11-03 ENCOUNTER — Encounter (HOSPITAL_COMMUNITY): Payer: Self-pay

## 2023-11-03 ENCOUNTER — Emergency Department (HOSPITAL_COMMUNITY): Payer: Commercial Managed Care - PPO

## 2023-11-03 DIAGNOSIS — M545 Low back pain, unspecified: Secondary | ICD-10-CM | POA: Insufficient documentation

## 2023-11-03 DIAGNOSIS — R9431 Abnormal electrocardiogram [ECG] [EKG]: Secondary | ICD-10-CM | POA: Diagnosis not present

## 2023-11-03 DIAGNOSIS — Z8616 Personal history of COVID-19: Secondary | ICD-10-CM | POA: Insufficient documentation

## 2023-11-03 DIAGNOSIS — D72829 Elevated white blood cell count, unspecified: Secondary | ICD-10-CM | POA: Diagnosis not present

## 2023-11-03 DIAGNOSIS — R197 Diarrhea, unspecified: Secondary | ICD-10-CM | POA: Insufficient documentation

## 2023-11-03 DIAGNOSIS — Z7901 Long term (current) use of anticoagulants: Secondary | ICD-10-CM | POA: Diagnosis not present

## 2023-11-03 DIAGNOSIS — R7401 Elevation of levels of liver transaminase levels: Secondary | ICD-10-CM | POA: Diagnosis not present

## 2023-11-03 DIAGNOSIS — N3289 Other specified disorders of bladder: Secondary | ICD-10-CM | POA: Diagnosis not present

## 2023-11-03 DIAGNOSIS — J9811 Atelectasis: Secondary | ICD-10-CM | POA: Diagnosis not present

## 2023-11-03 DIAGNOSIS — R1032 Left lower quadrant pain: Secondary | ICD-10-CM | POA: Insufficient documentation

## 2023-11-03 DIAGNOSIS — R109 Unspecified abdominal pain: Secondary | ICD-10-CM | POA: Diagnosis not present

## 2023-11-03 DIAGNOSIS — M47816 Spondylosis without myelopathy or radiculopathy, lumbar region: Secondary | ICD-10-CM | POA: Diagnosis not present

## 2023-11-03 DIAGNOSIS — R079 Chest pain, unspecified: Secondary | ICD-10-CM | POA: Diagnosis not present

## 2023-11-03 DIAGNOSIS — R059 Cough, unspecified: Secondary | ICD-10-CM | POA: Diagnosis not present

## 2023-11-03 LAB — COMPREHENSIVE METABOLIC PANEL
ALT: 54 U/L — ABNORMAL HIGH (ref 0–44)
AST: 48 U/L — ABNORMAL HIGH (ref 15–41)
Albumin: 4.5 g/dL (ref 3.5–5.0)
Alkaline Phosphatase: 85 U/L (ref 38–126)
Anion gap: 11 (ref 5–15)
BUN: 13 mg/dL (ref 6–20)
CO2: 25 mmol/L (ref 22–32)
Calcium: 10 mg/dL (ref 8.9–10.3)
Chloride: 102 mmol/L (ref 98–111)
Creatinine, Ser: 0.73 mg/dL (ref 0.61–1.24)
GFR, Estimated: >60 mL/min (ref >60)
Glucose, Bld: 105 mg/dL — ABNORMAL HIGH (ref 70–99)
Potassium: 3.4 mmol/L — ABNORMAL LOW (ref 3.5–5.1)
Sodium: 138 mmol/L (ref 135–145)
Total Bilirubin: 0.7 mg/dL (ref <1.2)
Total Protein: 7.5 g/dL (ref 6.5–8.1)

## 2023-11-03 LAB — CBC
HCT: 46.4 % (ref 39.0–52.0)
Hemoglobin: 16.3 g/dL (ref 13.0–17.0)
MCH: 29.5 pg (ref 26.0–34.0)
MCHC: 35.1 g/dL (ref 30.0–36.0)
MCV: 83.9 fL (ref 80.0–100.0)
Platelets: 144 10*3/uL — ABNORMAL LOW (ref 150–400)
RBC: 5.53 MIL/uL (ref 4.22–5.81)
RDW: 13.9 % (ref 11.5–15.5)
WBC: 11 10*3/uL — ABNORMAL HIGH (ref 4.0–10.5)
nRBC: 0 % (ref 0.0–0.2)

## 2023-11-03 LAB — LIPASE, BLOOD: Lipase: 38 U/L (ref 11–51)

## 2023-11-03 MED ORDER — IOHEXOL 350 MG/ML SOLN
75.0000 mL | Freq: Once | INTRAVENOUS | Status: AC | PRN
  Administered 2023-11-03: 75 mL via INTRAVENOUS

## 2023-11-03 MED ORDER — MORPHINE SULFATE (PF) 4 MG/ML IV SOLN
4.0000 mg | Freq: Once | INTRAVENOUS | Status: AC
  Administered 2023-11-03: 4 mg via INTRAVENOUS
  Filled 2023-11-03: qty 1

## 2023-11-03 MED ORDER — ONDANSETRON HCL 4 MG/2ML IJ SOLN
4.0000 mg | Freq: Once | INTRAMUSCULAR | Status: AC
  Administered 2023-11-03: 4 mg via INTRAVENOUS
  Filled 2023-11-03: qty 2

## 2023-11-03 MED ORDER — OXYCODONE HCL 5 MG PO TABS
5.0000 mg | ORAL_TABLET | ORAL | Status: AC
  Administered 2023-11-03: 5 mg via ORAL
  Filled 2023-11-03: qty 1

## 2023-11-03 NOTE — ED Triage Notes (Signed)
Pt c/o left flank pain 10/10 on pain scale accompanied with diarrhea. Pain since yesterday, but just became intolerable. Pt states that he noted a small drop of blood in underwear

## 2023-11-04 ENCOUNTER — Telehealth: Payer: Self-pay

## 2023-11-04 ENCOUNTER — Emergency Department (HOSPITAL_COMMUNITY): Payer: Commercial Managed Care - PPO

## 2023-11-04 DIAGNOSIS — M47816 Spondylosis without myelopathy or radiculopathy, lumbar region: Secondary | ICD-10-CM | POA: Diagnosis not present

## 2023-11-04 DIAGNOSIS — R109 Unspecified abdominal pain: Secondary | ICD-10-CM | POA: Diagnosis not present

## 2023-11-04 LAB — URINALYSIS, ROUTINE W REFLEX MICROSCOPIC
Bilirubin Urine: NEGATIVE
Glucose, UA: NEGATIVE mg/dL
Hgb urine dipstick: NEGATIVE
Ketones, ur: NEGATIVE mg/dL
Leukocytes,Ua: NEGATIVE
Nitrite: NEGATIVE
Protein, ur: NEGATIVE mg/dL
Specific Gravity, Urine: >1.046 — ABNORMAL HIGH (ref 1.005–1.030)
pH: 5 (ref 5.0–8.0)

## 2023-11-04 MED ORDER — POTASSIUM CHLORIDE CRYS ER 20 MEQ PO TBCR
40.0000 meq | EXTENDED_RELEASE_TABLET | Freq: Once | ORAL | Status: AC
  Administered 2023-11-04: 40 meq via ORAL
  Filled 2023-11-04: qty 2

## 2023-11-04 MED ORDER — LIDOCAINE HCL URETHRAL/MUCOSAL 2 % EX GEL: 1.0000 | Freq: Once | CUTANEOUS | Status: DC

## 2023-11-04 MED ORDER — OXYCODONE HCL 5 MG PO TABS: 5.0000 mg | ORAL_TABLET | ORAL | 0 refills | Status: DC | PRN

## 2023-11-04 MED ORDER — DICYCLOMINE HCL 20 MG PO TABS: 20.0000 mg | ORAL_TABLET | Freq: Two times a day (BID) | ORAL | 0 refills | Status: DC | PRN

## 2023-11-04 MED ORDER — AMOXICILLIN-POT CLAVULANATE 875-125 MG PO TABS: 1.0000 | ORAL_TABLET | Freq: Two times a day (BID) | ORAL | 0 refills | Status: DC

## 2023-11-04 MED ORDER — MORPHINE SULFATE (PF) 4 MG/ML IV SOLN
4.0000 mg | Freq: Once | INTRAVENOUS | Status: AC
  Administered 2023-11-04: 4 mg via INTRAVENOUS
  Filled 2023-11-04: qty 1

## 2023-11-04 MED ORDER — TAMSULOSIN HCL 0.4 MG PO CAPS: 0.4000 mg | ORAL_CAPSULE | Freq: Every day | ORAL | 0 refills | Status: DC

## 2023-11-04 MED ORDER — DICYCLOMINE HCL 10 MG/ML IM SOLN
20.0000 mg | Freq: Once | INTRAMUSCULAR | Status: AC
  Administered 2023-11-04: 20 mg via INTRAMUSCULAR
  Filled 2023-11-04: qty 2

## 2023-11-04 MED ORDER — OXYCODONE HCL 5 MG PO TABS
5.0000 mg | ORAL_TABLET | ORAL | Status: AC
  Administered 2023-11-04: 5 mg via ORAL
  Filled 2023-11-04: qty 1

## 2023-11-04 NOTE — ED Notes (Signed)
Patient was able to urinate on his own

## 2023-11-04 NOTE — Discharge Instructions (Addendum)
You were seen in the ER today for evaluation of your abdominal pain. Your CT scans were unremarkable for any acute changes.  I would like for you to follow-up with your GI provider as well as your primary care provider.  I am going to place you on an antibiotic for presumed colitis.  Please take as prescribed.  Additionally, going to send you home with some Flomax to take for your urinary symptoms.  Please make sure you try bland diet and stay well-hydrated drinking plenty of fluids, mainly water.  For pain, recommend taking 600 mg of ibuprofen and/or 1000 mg of Tylenol every 6 hours as needed for pain.  I have sent you in a few narcotic pain medications to take for breakthrough pain.  Please do not drive or operate heavy machinery while on this medication as it will make you sleepy. I am prescribing you some Bentyl as well to help with spasms. If you have any concerns, new or worsening symptoms, please return to the nearest ER for re-evaluation.   Contact a doctor if: Your belly pain changes or gets worse. You have very bad cramping or bloating in your belly. You vomit. Your pain gets worse with meals, after eating, or with certain foods. You have trouble pooping or have watery poop for more than 2-3 days. You are not hungry, or you lose weight without trying. You have signs of not getting enough fluid or water (dehydration). These may include: Dark pee, very little pee, or no pee. Cracked lips or dry mouth. Feeling sleepy or weak. You have pain when you pee or poop. Your belly pain wakes you up at night. You have blood in your pee. You have a fever. Get help right away if: You cannot stop vomiting. Your pain is only in one part of your belly, like on the right side. You have bloody or black poop, or poop that looks like tar. You have trouble breathing. You have chest pain. These symptoms may be an emergency. Get help right away. Call 911. Do not wait to see if the symptoms will go away. Do  not drive yourself to the hospital.

## 2023-11-04 NOTE — ED Provider Notes (Signed)
[place] EMERGENCY DEPARTMENT AT [facility] Provider Note   CSN: [id number] Arrival date & time: 11/03/23  2220     History Chief Complaint  Patient presents with   Flank Pain    Alan Sanders is a 52 y.o. male reportedly otherwise healthy presents emergency department today for evaluation of left flank and left lower quadrant pain.  He reports that he had multiple diarrheal episodes.  Around 10 episodes per day with past 2 days that is not black or bloody.  Denies any dysuria, hematuria, or vomiting.  Reports some nausea.  He reports he was recently diagnosed with COVID last week with cough, runny nose, nasal congestion however the symptoms have improved some.  Denies any chest pain or shortness of breath.  He denies any pain, swelling to his penis, scrotum, or testicles.  He reports the pain is no worse he has a cramping-like sensation.  He denies any fecal or urinary incontinence.  Denies any saddle anesthesia.  Denies any IV drug use or any fevers. Allergic to dilaudid. Denies any tobacco, EtOH, or illicit drug use.    Flank Pain Associated symptoms include abdominal pain. Pertinent negatives include no chest pain and no shortness of breath.       Home Medications Prior to Admission medications   Medication Sig Start Date End Date Taking? Authorizing Provider  albuterol (VENTOLIN HFA) 108 (90 Base) MCG/ACT inhaler Inhale 1-2 puffs into the lungs every 6 (six) hours as needed for wheezing or shortness of breath. Patient taking differently: Inhale 2-3 puffs into the lungs as needed for wheezing or shortness of breath. 07/18/22   Loyola Mast, MD  apixaban (ELIQUIS) 2.5 MG TABS tablet Take 1 tablet (2.5 mg total) by mouth 2 (two) times daily. 03/26/23 04/25/23  Montez Morita, PA-C  ascorbic acid (VITAMIN C) 1000 MG tablet Take 1 tablet (1,000 mg total) by mouth daily. 03/27/23   Montez Morita, PA-C  Cholecalciferol 125 MCG (5000 UT) TABS Take 1 tablet (5,000 Units total) by  mouth daily. 03/26/23   Montez Morita, PA-C  docusate sodium (COLACE) 100 MG capsule Take 1 capsule (100 mg total) by mouth 2 (two) times daily. 03/26/23   Montez Morita, PA-C  escitalopram (LEXAPRO) 10 MG tablet Take 2 tablets (20 mg total) by mouth daily. 06/15/23   Mliss Sax, MD  gabapentin (NEURONTIN) 300 MG capsule Take 1 capsule (300 mg total) by mouth 3 (three) times daily. 08/10/23   Mliss Sax, MD  HYDROcodone-acetaminophen (NORCO) 7.5-325 MG tablet Take 1-2 tablets by mouth every 6 (six) hours as needed for severe pain or moderate pain. 03/26/23   Montez Morita, PA-C  ondansetron (ZOFRAN-ODT) 4 MG disintegrating tablet Take 1 tablet (4 mg total) by mouth every 8 (eight) hours as needed. 09/26/23   Ernie Avena, MD  pantoprazole (PROTONIX) 40 MG tablet Take 1 tablet (40 mg total) by mouth daily. 02/23/23 03/25/23  Jenel Lucks, MD  Vitamin D, Ergocalciferol, (DRISDOL) 1.25 MG (50000 UNIT) CAPS capsule Take 1 capsule (50,000 Units total) by mouth every 7 (seven) days. 03/26/23   Montez Morita, PA-C      Allergies    Hydromorphone    Review of Systems   Review of Systems  Constitutional:  Negative for chills and fever.  Respiratory:  Negative for shortness of breath.   Cardiovascular:  Negative for chest pain.  Gastrointestinal:  Positive for abdominal pain, diarrhea and nausea. Negative for abdominal distention, constipation and vomiting.  Denies any fecal incontinence  Genitourinary:  Negative for dysuria, hematuria, penile discharge, penile pain, penile swelling, scrotal swelling and testicular pain.       Denies any urinary incontinence or urinary retention.  Musculoskeletal:  Positive for back pain.       Denies any saddle anesthesia  Neurological:  Negative for weakness and numbness.    Physical Exam Updated Vital Signs BP 123/87   Pulse 72   Temp 97.9 F (36.6 C) (Oral)   Resp 19   Ht 6\' 1"  (1.854 m)   Wt 97.5 kg   SpO2 98%   BMI 28.37 kg/m   Physical Exam Vitals and nursing note reviewed.  Constitutional:      Appearance: He is not toxic-appearing.     Comments: Uncomfortable, but nontoxic-appearing  HENT:     Mouth/Throat:     Mouth: Mucous membranes are moist.  Eyes:     General: No scleral icterus. Cardiovascular:     Rate and Rhythm: Normal rate.  Pulmonary:     Effort: Pulmonary effort is normal. No respiratory distress.     Breath sounds: Normal breath sounds.  Abdominal:     Palpations: Abdomen is soft.     Tenderness: There is abdominal tenderness. There is no right CVA tenderness, left CVA tenderness, guarding or rebound.  Musculoskeletal:       Back:     Right lower leg: No edema.     Left lower leg: No edema.     Comments: Tenderness palpation over the marked area.  No overlying skin changes noted.  No midline tenderness to palpation.  Skin:    General: Skin is warm and dry.  Neurological:     Mental Status: He is alert.     ED Results / Procedures / Treatments   Labs (all labs ordered are listed, but only abnormal results are displayed) Labs Reviewed  COMPREHENSIVE METABOLIC PANEL - Abnormal; Notable for the following components:      Result Value   Potassium 3.4 (*)    Glucose, Bld 105 (*)    AST 48 (*)    ALT 54 (*)    All other components within normal limits  CBC - Abnormal; Notable for the following components:   WBC 11.0 (*)    Platelets 144 (*)    All other components within normal limits  URINALYSIS, ROUTINE W REFLEX MICROSCOPIC - Abnormal; Notable for the following components:   Specific Gravity, Urine >1.046 (*)    All other components within normal limits  LIPASE, BLOOD    EKG None  Radiology CT L-SPINE NO CHARGE  Result Date: 11/04/2023 CLINICAL DATA:  Left flank pain EXAM: CT LUMBAR SPINE WITHOUT CONTRAST TECHNIQUE: Multidetector CT imaging of the lumbar spine was performed without intravenous contrast administration. Multiplanar CT image reconstructions were also  generated. RADIATION DOSE REDUCTION: This exam was performed according to the departmental dose-optimization program which includes automated exposure control, adjustment of the mA and/or kV according to patient size and/or use of iterative reconstruction technique. COMPARISON:  Concurrent CT abdomen/pelvis dated 11/03/2023 FINDINGS: Segmentation: 5 lumbar type vertebral bodies. Alignment: Normal lumbar lordosis. Vertebrae: No acute fracture or focal pathologic process. Paraspinal and other soft tissues: Evaluated on dedicated CT abdomen/pelvis. Disc levels: Mild degenerative changes at L2-3. Spinal canal is patent. IMPRESSION: Mild degenerative changes at L2-3. Electronically Signed   By: Charline Bills M.D.   On: 11/04/2023 01:28   CT ABDOMEN PELVIS W CONTRAST  Result Date: 11/03/2023 CLINICAL DATA:  Left lower quadrant pain EXAM: CT ABDOMEN AND PELVIS WITH CONTRAST TECHNIQUE: Multidetector CT imaging of the abdomen and pelvis was performed using the standard protocol following bolus administration of intravenous contrast. RADIATION DOSE REDUCTION: This exam was performed according to the departmental dose-optimization program which includes automated exposure control, adjustment of the mA and/or kV according to patient size and/or use of iterative reconstruction technique. CONTRAST:  75 mL Omnipaque 350. COMPARISON:  02/11/2023 FINDINGS: Lower chest: No acute abnormality. Hepatobiliary: No focal liver abnormality is seen. No gallstones, gallbladder wall thickening, or biliary dilatation. Pancreas: Unremarkable. No pancreatic ductal dilatation or surrounding inflammatory changes. Spleen: Normal in size without focal abnormality. Adrenals/Urinary Tract: Adrenal glands are within normal limits. Kidneys demonstrate a normal enhancement pattern bilaterally. No renal calculi or obstructive changes are seen. The bladder is partially distended. Stomach/Bowel: No obstructive or inflammatory changes of the colon  are seen. The appendix is air-filled and within normal limits. Small bowel and stomach are unremarkable. Vascular/Lymphatic: No significant vascular findings are present. No enlarged abdominal or pelvic lymph nodes. Reproductive: Prostate is unremarkable. Other: No abdominal wall hernia or abnormality. No abdominopelvic ascites. Musculoskeletal: No acute or significant osseous findings. IMPRESSION: No acute abnormality noted. Electronically Signed   By: Alcide Clever M.D.   On: 11/03/2023 23:54   DG Chest Portable 1 View  Result Date: 11/03/2023 CLINICAL DATA:  Chest pain, cough EXAM: PORTABLE CHEST 1 VIEW COMPARISON:  10/29/2023 FINDINGS: Mild left basilar atelectasis. Right lung is clear. No pleural effusion or pneumothorax. The heart is normal in size. IMPRESSION: Mild left basilar atelectasis. Electronically Signed   By: Charline Bills M.D.   On: 11/03/2023 22:56    Procedures Procedures   Medications Ordered in ED Medications  lidocaine (XYLOCAINE) 2 % jelly 1 Application (has no administration in time range)  morphine (PF) 4 MG/ML injection 4 mg (4 mg Intravenous Given 11/03/23 2314)  oxyCODONE (Oxy IR/ROXICODONE) immediate release tablet 5 mg (5 mg Oral Given 11/03/23 2314)  ondansetron (ZOFRAN) injection 4 mg (4 mg Intravenous Given 11/03/23 2314)  iohexol (OMNIPAQUE) 350 MG/ML injection 75 mL (75 mLs Intravenous Contrast Given 11/03/23 2350)  dicyclomine (BENTYL) injection 20 mg (20 mg Intramuscular Given 11/04/23 0121)  morphine (PF) 4 MG/ML injection 4 mg (4 mg Intravenous Given 11/04/23 0121)  oxyCODONE (Oxy IR/ROXICODONE) immediate release tablet 5 mg (5 mg Oral Given 11/04/23 0234)    ED Course/ Medical Decision Making/ A&P   Medical Decision Making Amount and/or Complexity of Data Reviewed Labs: ordered. Radiology: ordered.  Risk Prescription drug management.   52 y.o. male presents to the ER for evaluation of left lower abdominal pain, left lower back pain,  diarrhea. Differential diagnosis includes but is not limited to AAA, mesenteric ischemia, appendicitis, diverticulitis, DKA, gastroenteritis, nephrolithiasis, pancreatitis, constipation, UTI, bowel obstruction, biliary disease, IBD, PUD, hepatitis, testicular torsion. Vital signs temp 97.30F otherwise unremarkable. Physical exam as noted above.   I independently reviewed and interpreted the patient's labs.  Urinalysis shows concentrated urine otherwise unremarkable CMP shows potassium 3.4, glucose at 105.  Mildly elevated AST and ALT otherwise unremarkable for any electrolyte or LFT abnormality.  Lipase within normal limits.  CBC shows mildly decreased platelet count at 144.  Mild leukocytosis at 11.  No anemia.  CXR Mild left basilar atelectasis. Per radiologist's interpretation.    CT Abd Pelvis No acute abnormality noted. Per radiologist's interpretation.    CT Lumbar Mild degenerative changes at L2-3. Per radiologist's interpretation.    The patient was having  difficulty with urination and reports that he could not urinate to give a sample. Bladder scan showed almost 400cc. Catheter was attempted, but the patient did not tolerate. He was able to void without catheter and reports feeling much better. My attending recommends starting the patient on Augmentin and Flomax. Will also include few narcotics for breakthrough pain and bentyl since he seemed to have good results with this. CT scans do not show any acute abdominal etiology.  Repeat abdominal exam, belly is nontender, soft. The patient reports he is feeling better.   We discussed the results of the labs/imaging. The plan is take medications as prescribed. Follow up with PCP and GI provider. We discussed strict return precautions and red flag symptoms. The patient verbalized their understanding and agrees to the plan. The patient is stable and being discharged home in good condition.  Portions of this report may have been transcribed using voice  recognition software. Every effort was made to ensure accuracy; however, inadvertent computerized transcription errors may be present.   I discussed this case with my attending physician who cosigned this note including patient's presenting symptoms, physical exam, and planned diagnostics and interventions. Attending physician stated agreement with plan or made changes to plan which were implemented.   Attending physician assessed patient at bedside.  Final Clinical Impression(s) / ED Diagnoses Final diagnoses:  LLQ pain  Diarrhea, unspecified type    Rx / DC Orders ED Discharge Orders          Ordered    amoxicillin-clavulanate (AUGMENTIN) 875-125 MG tablet  Every 12 hours        11/04/23 0356    tamsulosin (FLOMAX) 0.4 MG CAPS capsule  Daily after breakfast        11/04/23 0356    oxyCODONE (ROXICODONE) 5 MG immediate release tablet  Every 4 hours PRN        11/04/23 0356    dicyclomine (BENTYL) 20 MG tablet  2 times daily PRN        11/04/23 0356              Achille Rich, PA-C 11/06/23 1911    Gilda Crease, MD 11/12/23 [phone]

## 2023-11-04 NOTE — Telephone Encounter (Signed)
Pt called back please give him a call again

## 2023-11-04 NOTE — Transitions of Care (Post Inpatient/ED Visit) (Signed)
   11/04/2023  Name: Alan Sanders MRN: [id number] DOB: 1971-06-23  Today's TOC FU Call Status: Today's TOC FU Call Status:: Unsuccessful Call (3rd Attempt) Unsuccessful Call (3rd Attempt) Date: 11/04/23  Attempted to reach the patient regarding the most recent Inpatient/ED visit.  Follow Up Plan: No further outreach attempts will be made at this time. We have been unable to contact the patient.  Signature Arvil Persons, BSN, Charity fundraiser

## 2023-11-04 NOTE — ED Notes (Signed)
Tried to collect urine from patient. Provided an bedside urinal. Patient unable to urinate . Bladder scanned patien 323 mls showed in bladder.

## 2023-11-04 NOTE — Transitions of Care (Post Inpatient/ED Visit) (Signed)
   11/04/2023  Name: Alan Sanders MRN: [id number] DOB: 31-Oct-1971  Today's TOC FU Call Status: Today's TOC FU Call Status:: Unsuccessful Call (1st Attempt) Unsuccessful Call (1st Attempt) Date: 11/04/23  Attempted to reach the patient regarding the most recent Inpatient/ED visit.  Follow Up Plan: Additional outreach attempts will be made to reach the patient to complete the Transitions of Care (Post Inpatient/ED visit) call.   Signature Arvil Persons, BSN, Charity fundraiser

## 2023-11-11 NOTE — Transitions of Care (Post Inpatient/ED Visit) (Signed)
   11/11/2023  Name: Alan Sanders MRN: [id number] DOB: [birth year]  Today's TOC FU Call Status: Today's TOC FU Call Status:: Unsuccessful Call (2nd Attempt) Unsuccessful Call (1st Attempt) Date: 11/04/23 Unsuccessful Call (2nd Attempt) Date: 11/11/23  Attempted to reach the patient regarding the most recent Inpatient/ED visit.  Follow Up Plan: Additional outreach attempts will be made to reach the patient to complete the Transitions of Care (Post Inpatient/ED visit) call.   Signature Arvil Persons, BSN, Charity fundraiser

## 2023-11-13 ENCOUNTER — Other Ambulatory Visit: Payer: Self-pay

## 2023-11-13 ENCOUNTER — Other Ambulatory Visit: Payer: Self-pay | Admitting: Family Medicine

## 2023-11-13 ENCOUNTER — Other Ambulatory Visit (HOSPITAL_COMMUNITY): Payer: Self-pay

## 2023-11-13 DIAGNOSIS — M79672 Pain in left foot: Secondary | ICD-10-CM

## 2023-11-13 DIAGNOSIS — F418 Other specified anxiety disorders: Secondary | ICD-10-CM

## 2023-11-16 ENCOUNTER — Other Ambulatory Visit: Payer: Self-pay

## 2023-11-16 NOTE — Transitions of Care (Post Inpatient/ED Visit) (Signed)
   11/16/2023  Name: BARCLAY LOOK MRN: [id number] DOB: July 07, 1971  Today's TOC FU Call Status: Today's TOC FU Call Status:: Unsuccessful Call (2nd Attempt) Unsuccessful Call (1st Attempt) Date: 11/04/23 Unsuccessful Call (2nd Attempt) Date: 11/11/23 Unsuccessful Call (3rd Attempt) Date: 11/16/23  Attempted to reach the patient regarding the most recent Inpatient/ED visit.  Follow Up Plan: No further outreach attempts will be made at this time. We have been unable to contact the patient.  Signature Arvil Persons, BSN, Charity fundraiser

## 2023-11-26 ENCOUNTER — Encounter (HOSPITAL_COMMUNITY): Payer: Self-pay

## 2023-11-26 ENCOUNTER — Emergency Department (HOSPITAL_COMMUNITY): Payer: Worker's Compensation

## 2023-11-26 ENCOUNTER — Emergency Department (HOSPITAL_COMMUNITY)
Admission: EM | Admit: 2023-11-26 | Discharge: 2023-11-26 | Disposition: A | Discharge status: Home / Self Care | Payer: Worker's Compensation | Attending: Emergency Medicine | Admitting: Emergency Medicine

## 2023-11-26 ENCOUNTER — Other Ambulatory Visit (HOSPITAL_BASED_OUTPATIENT_CLINIC_OR_DEPARTMENT_OTHER): Payer: Self-pay

## 2023-11-26 ENCOUNTER — Other Ambulatory Visit: Payer: Self-pay

## 2023-11-26 DIAGNOSIS — R000 Tachycardia, unspecified: Secondary | ICD-10-CM | POA: Diagnosis not present

## 2023-11-26 DIAGNOSIS — F17210 Nicotine dependence, cigarettes, uncomplicated: Secondary | ICD-10-CM | POA: Insufficient documentation

## 2023-11-26 DIAGNOSIS — R42 Dizziness and giddiness: Secondary | ICD-10-CM | POA: Diagnosis not present

## 2023-11-26 DIAGNOSIS — T71193A Asphyxiation due to mechanical threat to breathing due to other causes, assault, initial encounter: Secondary | ICD-10-CM | POA: Insufficient documentation

## 2023-11-26 DIAGNOSIS — R112 Nausea with vomiting, unspecified: Secondary | ICD-10-CM | POA: Diagnosis not present

## 2023-11-26 DIAGNOSIS — M542 Cervicalgia: Secondary | ICD-10-CM | POA: Insufficient documentation

## 2023-11-26 DIAGNOSIS — Z7951 Long term (current) use of inhaled steroids: Secondary | ICD-10-CM | POA: Diagnosis not present

## 2023-11-26 DIAGNOSIS — J45909 Unspecified asthma, uncomplicated: Secondary | ICD-10-CM | POA: Insufficient documentation

## 2023-11-26 LAB — I-STAT CHEM 8, ED
BUN: 7 mg/dL (ref 6–20)
Calcium, Ion: 1.11 mmol/L — ABNORMAL LOW (ref 1.15–1.40)
Chloride: 99 mmol/L (ref 98–111)
Creatinine, Ser: 1 mg/dL (ref 0.61–1.24)
Glucose, Bld: 199 mg/dL — ABNORMAL HIGH (ref 70–99)
HCT: 44 % (ref 39.0–52.0)
Hemoglobin: 15 g/dL (ref 13.0–17.0)
Potassium: 3.8 mmol/L (ref 3.5–5.1)
Sodium: 138 mmol/L (ref 135–145)
TCO2: 23 mmol/L (ref 22–32)

## 2023-11-26 MED ORDER — PREDNISONE 20 MG PO TABS
40.0000 mg | ORAL_TABLET | Freq: Every day | ORAL | 0 refills | Status: DC
  Filled 2023-11-26: qty 10, 5d supply, fill #0

## 2023-11-26 MED ORDER — MORPHINE SULFATE (PF) 4 MG/ML IV SOLN
4.0000 mg | Freq: Once | INTRAVENOUS | Status: AC
  Administered 2023-11-26: 4 mg via INTRAVENOUS
  Filled 2023-11-26: qty 1

## 2023-11-26 MED ORDER — METHYLPREDNISOLONE SODIUM SUCC 125 MG IJ SOLR: 125.0000 mg | Freq: Every day | INTRAMUSCULAR | Status: DC

## 2023-11-26 MED ORDER — LORAZEPAM 2 MG/ML IJ SOLN
1.0000 mg | Freq: Once | INTRAMUSCULAR | Status: AC
  Administered 2023-11-26: 1 mg via INTRAVENOUS
  Filled 2023-11-26: qty 1

## 2023-11-26 MED ORDER — PREDNISONE 20 MG PO TABS: 40.0000 mg | ORAL_TABLET | Freq: Every day | ORAL | 0 refills | Status: AC

## 2023-11-26 MED ORDER — RACEPINEPHRINE HCL 2.25 % IN NEBU
0.2500 mL | INHALATION_SOLUTION | Freq: Once | RESPIRATORY_TRACT | Status: AC
  Administered 2023-11-26: 0.25 mL via RESPIRATORY_TRACT

## 2023-11-26 MED ORDER — METHYLPREDNISOLONE SODIUM SUCC 125 MG IJ SOLR
125.0000 mg | Freq: Once | INTRAMUSCULAR | Status: AC
  Administered 2023-11-26: 125 mg via INTRAVENOUS

## 2023-11-26 MED ORDER — LIDOCAINE VISCOUS HCL 2 % MT SOLN
15.0000 mL | Freq: Once | OROMUCOSAL | Status: AC
  Administered 2023-11-26: 15 mL via OROMUCOSAL
  Filled 2023-11-26: qty 15

## 2023-11-26 MED ORDER — ONDANSETRON HCL 4 MG/2ML IJ SOLN
4.0000 mg | Freq: Once | INTRAMUSCULAR | Status: AC
  Administered 2023-11-26: 4 mg via INTRAVENOUS

## 2023-11-26 MED ORDER — ALUM & MAG HYDROXIDE-SIMETH 200-200-20 MG/5ML PO SUSP
30.0000 mL | Freq: Once | ORAL | Status: AC
  Administered 2023-11-26: 30 mL via ORAL
  Filled 2023-11-26: qty 30

## 2023-11-26 MED ORDER — IOHEXOL 350 MG/ML SOLN
75.0000 mL | Freq: Once | INTRAVENOUS | Status: AC | PRN
  Administered 2023-11-26: 75 mL via INTRAVENOUS
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
## 2023-11-26 NOTE — Discharge Instructions (Signed)
 You were evaluated in the Emergency Department and after careful evaluation, we did not find any emergent condition requiring admission or further testing in the hospital.  Your exam/testing today is overall reassuring.  Recommend taking the steroids as directed over the next few days to control the swelling or inflammation.  Use Tylenol  at home for discomfort.  Recommend follow-up with your primary care doctor to discuss your visit.  Please return to the Emergency Department if you experience any worsening of your condition.   Thank you for allowing us  to be a part of your care.
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
## 2023-12-01 ENCOUNTER — Other Ambulatory Visit (INDEPENDENT_AMBULATORY_CARE_PROVIDER_SITE_OTHER): Payer: Commercial Managed Care - PPO

## 2023-12-01 ENCOUNTER — Other Ambulatory Visit: Payer: Commercial Managed Care - PPO

## 2023-12-01 ENCOUNTER — Encounter: Payer: Self-pay | Admitting: Gastroenterology

## 2023-12-01 ENCOUNTER — Ambulatory Visit (INDEPENDENT_AMBULATORY_CARE_PROVIDER_SITE_OTHER)
Admission: RE | Admit: 2023-12-01 | Discharge: 2023-12-01 | Disposition: A | Discharge status: Home / Self Care | Payer: Commercial Managed Care - PPO | Source: Ambulatory Visit | Attending: Gastroenterology | Admitting: Gastroenterology

## 2023-12-01 ENCOUNTER — Ambulatory Visit: Payer: Commercial Managed Care - PPO | Admitting: Gastroenterology

## 2023-12-01 VITALS — BP 120/82 | HR 88 | Ht 73.0 in | Wt 220.8 lb

## 2023-12-01 DIAGNOSIS — R197 Diarrhea, unspecified: Secondary | ICD-10-CM

## 2023-12-01 DIAGNOSIS — R1030 Lower abdominal pain, unspecified: Secondary | ICD-10-CM

## 2023-12-01 DIAGNOSIS — Z860101 Personal history of adenomatous and serrated colon polyps: Secondary | ICD-10-CM | POA: Diagnosis not present

## 2023-12-01 DIAGNOSIS — R7989 Other specified abnormal findings of blood chemistry: Secondary | ICD-10-CM

## 2023-12-01 DIAGNOSIS — Z860100 Personal history of colon polyps, unspecified: Secondary | ICD-10-CM

## 2023-12-01 LAB — COMPREHENSIVE METABOLIC PANEL
ALT: 108 U/L — ABNORMAL HIGH (ref 0–53)
AST: 59 U/L — ABNORMAL HIGH (ref 0–37)
Albumin: 5 g/dL (ref 3.5–5.2)
Alkaline Phosphatase: 88 U/L (ref 39–117)
BUN: 13 mg/dL (ref 6–23)
CO2: 29 meq/L (ref 19–32)
Calcium: 10 mg/dL (ref 8.4–10.5)
Chloride: 97 meq/L (ref 96–112)
Creatinine, Ser: 0.7 mg/dL (ref 0.40–1.50)
GFR: 105.85 mL/min (ref >60.00)
Glucose, Bld: 114 mg/dL — ABNORMAL HIGH (ref 70–99)
Potassium: 3.6 meq/L (ref 3.5–5.1)
Sodium: 136 meq/L (ref 135–145)
Total Bilirubin: 0.7 mg/dL (ref 0.2–1.2)
Total Protein: 7.6 g/dL (ref 6.0–8.3)

## 2023-12-01 LAB — CBC WITH DIFFERENTIAL/PLATELET
Basophils Absolute: 0.1 10*3/uL (ref 0.0–0.1)
Basophils Relative: 1 % (ref 0.0–3.0)
Eosinophils Absolute: 0.3 10*3/uL (ref 0.0–0.7)
Eosinophils Relative: 3.1 % (ref 0.0–5.0)
HCT: 46.2 % (ref 39.0–52.0)
Hemoglobin: 16.3 g/dL (ref 13.0–17.0)
Lymphocytes Relative: 23.5 % (ref 12.0–46.0)
Lymphs Abs: 2.1 10*3/uL (ref 0.7–4.0)
MCHC: 35.3 g/dL (ref 30.0–36.0)
MCV: 85.4 fL (ref 78.0–100.0)
Monocytes Absolute: 0.5 10*3/uL (ref 0.1–1.0)
Monocytes Relative: 5.4 % (ref 3.0–12.0)
Neutro Abs: 6.1 10*3/uL (ref 1.4–7.7)
Neutrophils Relative %: 67 % (ref 43.0–77.0)
Platelets: 136 10*3/uL — ABNORMAL LOW (ref 150.0–400.0)
RBC: 5.42 Mil/uL (ref 4.22–5.81)
RDW: 13.9 % (ref 11.5–15.5)
WBC: 9.1 10*3/uL (ref 4.0–10.5)

## 2023-12-01 LAB — TSH: TSH: 6.21 u[IU]/mL — ABNORMAL HIGH (ref 0.35–5.50)

## 2023-12-01 MED ORDER — DICYCLOMINE HCL 20 MG PO TABS: 20.0000 mg | ORAL_TABLET | Freq: Two times a day (BID) | ORAL | 0 refills | Status: DC | PRN

## 2023-12-01 NOTE — Progress Notes (Signed)
 Chief Complaint: diarrhea, abdominal pain Primary GI MD: Dr. Stacia  HPI: 53 year old male history of anxiety, asthma, depression, colon polyps, thrombocytopenia, Barrett's esophagus, presents for evaluation of diarrhea and abdominal pain  Last seen February 2024 by Harlene Mail, PA-C  At that time he was having diarrhea ongoing since May 2023 after his colonoscopy.  18 pound weight loss.  Nausea, vomiting, abdominal pain.  CT abdomen pelvis with contrast December 30, 2022 suggested dilated small bowel loops but repeat scan 4 days later showed resolution and no acute findings.  TSH normal Celiac negative Patient underwent EGD/colonoscopy.  See detailed results below.  Colonoscopy showed multiple precancerous polyps and patient was sent for genetic testing.  It also showed an adequate prep with repeat recommended in less than a year.  EGD showed mild reactive gastropathy, negative H. pylori.  Showed Barrett's.  Negative for celiac.  Since last visit patient has had multiple ED encounters for various reasons.  CT abdomen pelvis with contrast 11/03/2023 done for LLQ pain showed no acute abnormality.  No inflammatory changes.  No gallstones or gallbladder wall thickening.  Unremarkable pancreas.  Recent lab studies 11/03/2023 AST 48, ALT 54, alk phos 85, total bilirubin 0.7 Leukocytosis with WBC 11.0 Chronic thrombocytopenia with platelets 144  -------------------TODAY-----------------------  Patient presents today with his wife for diarrhea and lower abdominal pain ongoing for about 4 months.  He states he has had 2 rounds of antibiotics this year.  Once in April when he broke his leg and then once recently he was put on Augmentin .  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  He reports 3-4 watery bowel movements per day.  States anytime he eats food he will have a watery bowel movement.  His lower abdominal pain is constant and nothing makes it better or worse.  It  does not get relieved with a bowel movement.  Denies melena/hematochezia.  He has tried Pepto-Bismol with no relief.  He was given dicyclomine  for his recent emergency department visit but he did not take this.  Reports he also has associated nausea.  Prior to 4 months ago he states he was doing well and having normal bowel movements.  He often feels like he has to use the bathroom but sometimes cannot and sometimes has to strain with bowel movements as well.  Denies having formed stools.  He is not taking the narcotic pain medication that is in his chart.  That was from when he had his leg fracture.  He is also not on Eliquis  any longer (also put on for his fracture)  PREVIOUS GI WORKUP   Colonoscopy 02/23/2023 - Preparation of the colon was inadequate.  - One 5 mm polyp in the ascending colon, removed with a cold snare. Resected and retrieved.  - One 10 mm polyp in the ascending colon, removed with a cold snare. Resected and retrieved.  - Two 4 to 5 mm polyps at the splenic flexure, removed with a cold snare. Resected and retrieved.  - A tattoo was seen in the distal transverse colon. The tattoo site appeared normal.  - Normal mucosa in the entire examined colon. Biopsied.  - The examined portion of the ileum was normal.  - The distal rectum and anal verge are normal on retroflexion view.  - The GI Genius ( intelligent endoscopy module) , computer- aided polyp detection system powered by AI was utilized to detect colorectal polyps through enhanced visualization during colonoscopy - repeat < 1 year  EGD 02/23/2023 for diarrhea, no, vomiting, weight loss - The examined portions of the nasopharynx, oropharynx and larynx were normal.  - Esophageal mucosal changes suspicious for short- segment Barrett' s esophagus, classified as Barrett' s stage C0- M2 per Prague criteria. Biopsied.  - Normal stomach. Biopsied.  - Normal examined duodenum. Biopsied.  Diagnosis 1. Surgical [P], duodenal - BENIGN  SMALL BOWEL MUCOSA WITH NO SIGNIFICANT PATHOLOGIC CHANGES 2. Surgical [P], gastric - GASTRIC ANTRAL AND OXYNTIC MUCOSA WITH MILD REACTIVE/REPARATIVE CHANGES - NEGATIVE FOR H. PYLORI ON H&E STAIN - NEGATIVE FOR INTESTINAL METAPLASIA OR MALIGNANCY 3. Surgical [P], esophagus at 40 cm - BARRETT MUCOSA, NEGATIVE FOR DYSPLASIA 4. Surgical [P], esophagus at 38 cm - BARRETT MUCOSA, NEGATIVE FOR DYSPLASIA 5. Surgical [P], colon, splenic flexure and ascending, polyp (4) - TUBULAR ADENOMA(S). - NO HIGH GRADE DYSPLASIA OR MALIGNANCY. 6. Surgical [P], random colon sites - BENIGN COLONIC MUCOSA WITH NO SPECIFIC PATHOLOGIC CHANGES - NEGATIVE FOR INCREASED INTRAEPITHELIAL LYMPHOCYTES OR THICKENED SUBEPITHELIAL COLLAGEN TABLE - NEGATIVE FOR DYSPLASIA OR MALIGNANCY   Colonoscopy 03/2022: - Ten 4 to 8 mm polyps at the recto-sigmoid colon, in the descending colon, at the splenic flexure, in the transverse colon and at the hepatic flexure, removed with a cold snare. Resected and retrieved.  - One 20 mm polyp in the distal transverse colon, removed using injection-lift and a hot snare. Resected and retrieved. Clips (MR conditional) were placed. Tattooed.  - One 15 mm polyp in the sigmoid colon, removed with a hot snare. Resected and retrieved. Biopsied.  - One 8 mm polyp in the sigmoid colon, removed with a cold snare. Resected and retrieved.  - The examined portion of the ileum was normal.  - The distal rectum and anal verge are normal on retroflexion view.   1. Surgical [P], colon, recto-sigmoid, descending, splenic flexure, transverse, and hepatic flexure, polyp (10) - TUBULAR ADENOMAS, MULTIPLE ADENOMATOUS FRAGMENTS, NEGATIVE FOR HIGH-GRADE DYSPLASIA. 2. Surgical [P], colon, transverse, polyp (1) - TUBULAR ADENOMA, NEGATIVE FOR HIGH-GRADE DYSPLASIA. 3. Surgical [P], colon, sigmoid, polyp (1) - TUBULAR ADENOMA, NEGATIVE FOR HIGH-GRADE DYSPLASIA. 4. Surgical [P], colon, sigmoid, polyp (1) - TUBULOVILLOUS  ADENOMA, NEGATIVE FOR HIGH-GRADE DYSPLASIA, PEDUNCULATED (STALK MARGIN NEGATIVE FOR ADENOMATOUS CHANGE). 5. Surgical [P], colon, sigmoid, polyp (1) SUGGESTIVE OF GOBLET CELL-RICH HYPERPLASTIC POLYP.   Repeat recommended in 6 months.  Past Medical History:  Diagnosis Date   Anxiety    situational anxiety-on meds   Arthritis    neck/back/LEFT wrist   Asthma    PRN inhaler   Balance problem    Depression    on meds   Family history of colonic polyps 05/08/2022   Gait difficulty    GERD (gastroesophageal reflux disease)    OTC PRN meds   Headache    migraines   History of colonic polyps 05/08/2022   Inguinal hernia of left side without obstruction or gangrene    Pneumonia    Seasonal allergies    Seizures (HCC)     its been along time ,since my last seizure    Spleen enlarged    Testosterone  deficiency in male 03/26/2023   Thrombocytopenia (HCC)    Tobacco abuse     Past Surgical History:  Procedure Laterality Date   ANTERIOR CERVICAL DECOMP/DISCECTOMY FUSION N/A 04/16/2021   Procedure: Cervical three-four  Anterior cervical decompression/discectomy/fusion;  Surgeon: Carollee Lani BROCKS, DO;  Location: [facility] OR;  Service: Neurosurgery;  Laterality: N/A;   COLONOSCOPY  03/2022   [place]-MAC-suprep(adeq)-many TA's-1 piecemeal  INGUINAL HERNIA REPAIR Right 2006   POLYPECTOMY  03/2022   many TA's-1 piecemeal   SURAL NERVE BX Right 12/11/2020   Procedure: SURAL NERVE BIOPSY;  Surgeon: Carollee Lani BROCKS, DO;  Location: [facility] OR;  Service: Neurosurgery;  Laterality: Right;   TIBIA IM NAIL INSERTION Right 03/23/2023   Procedure: INTRAMEDULLARY (IM) NAIL TIBIAL;  Surgeon: Celena Sharper, MD;  Location: [facility] OR;  Service: Orthopedics;  Laterality: Right;    Current Outpatient Medications  Medication Sig Dispense Refill   albuterol  (VENTOLIN  HFA) 108 (90 Base) MCG/ACT inhaler Inhale 1-2 puffs into the lungs every 6 (six) hours as needed for wheezing or shortness of breath. (Patient taking  differently: Inhale 2-3 puffs into the lungs as needed for wheezing or shortness of breath.) 6.7 g 2   amoxicillin -clavulanate (AUGMENTIN ) 875-125 MG tablet Take 1 tablet by mouth every 12 (twelve) hours. 14 tablet 0   ascorbic acid  (VITAMIN C ) 1000 MG tablet Take 1 tablet (1,000 mg total) by mouth daily. 30 tablet 1   Cholecalciferol  125 MCG (5000 UT) TABS Take 1 tablet (5,000 Units total) by mouth daily. 30 tablet 6   dicyclomine  (BENTYL ) 20 MG tablet Take 1 tablet (20 mg total) by mouth 2 (two) times daily as needed for spasms. 10 tablet 0   docusate sodium  (COLACE) 100 MG capsule Take 1 capsule (100 mg total) by mouth 2 (two) times daily. 30 capsule 0   escitalopram  (LEXAPRO ) 10 MG tablet Take 2 tablets (20 mg total) by mouth daily. 60 tablet 1   gabapentin  (NEURONTIN ) 300 MG capsule Take 1 capsule (300 mg total) by mouth 3 (three) times daily. 90 capsule 2   ondansetron  (ZOFRAN -ODT) 4 MG disintegrating tablet Take 1 tablet (4 mg total) by mouth every 8 (eight) hours as needed. 20 tablet 0   oxyCODONE  (ROXICODONE ) 5 MG immediate release tablet Take 1 tablet (5 mg total) by mouth every 4 (four) hours as needed for severe pain (pain score 7-10). 7 tablet 0   predniSONE  (DELTASONE ) 20 MG tablet Take 2 tablets (40 mg total) by mouth daily for 5 days. 10 tablet 0   tamsulosin  (FLOMAX ) 0.4 MG CAPS capsule Take 1 capsule (0.4 mg total) by mouth daily after breakfast. 10 capsule 0   Vitamin D , Ergocalciferol , (DRISDOL ) 1.25 MG (50000 UNIT) CAPS capsule Take 1 capsule (50,000 Units total) by mouth every 7 (seven) days. 5 capsule 1   apixaban  (ELIQUIS ) 2.5 MG TABS tablet Take 1 tablet (2.5 mg total) by mouth 2 (two) times daily. 60 tablet 0   pantoprazole  (PROTONIX ) 40 MG tablet Take 1 tablet (40 mg total) by mouth daily. 30 tablet 0   No current facility-administered medications for this visit.    Allergies as of 12/01/2023 - Review Complete 12/01/2023  Allergen Reaction Noted   Hydromorphone  Itching  and Rash 12/18/2021    Family History  Problem Relation Age of Onset   Diabetes Mother    Hypertension Mother    COPD Mother    Colon polyps Father        unknown number   Alcoholism Brother    Colon cancer Neg Hx    Esophageal cancer Neg Hx    Stomach cancer Neg Hx    Rectal cancer Neg Hx     Social History   Socioeconomic History   Marital status: Married    Spouse name: Not on file   Number of children: 2   Years of education: Not on file   Highest education level:  High school graduate  Occupational History   Not on file  Tobacco Use   Smoking status: Every Day    Current packs/day: 0.50    Average packs/day: 0.5 packs/day for 24.0 years (12.0 ttl pk-yrs)    Types: Cigarettes   Smokeless tobacco: Current    Types: Snuff   Tobacco comments:    Chewed snuff last night  Vaping Use   Vaping status: Never Used  Substance and Sexual Activity   Alcohol use: No   Drug use: No   Sexual activity: Not on file  Other Topics Concern   Not on file  Social History Narrative   Lives with spouse   Sweet tea, energy drinks, Diet Coke, work 12 hr shifts   Social Drivers of Corporate Investment Banker Strain: Not on file  Food Insecurity: No Food Insecurity (03/22/2023)   Hunger Vital Sign    Worried About Running Out of Food in the Last Year: Never true    Ran Out of Food in the Last Year: Never true  Transportation Needs: No Transportation Needs (03/22/2023)   PRAPARE - Administrator, Civil Service (Medical): No    Lack of Transportation (Non-Medical): No  Physical Activity: Not on file  Stress: Not on file  Social Connections: Not on file  Intimate Partner Violence: Not At Risk (02/12/2023)   Humiliation, Afraid, Rape, and Kick questionnaire    Fear of Current or Ex-Partner: No    Emotionally Abused: No    Physically Abused: No    Sexually Abused: No    Review of Systems:    Constitutional: No weight loss, fever, chills, weakness or fatigue HEENT:  Eyes: No change in vision               Ears, Nose, Throat:  No change in hearing or congestion Skin: No rash or itching Cardiovascular: No chest pain, chest pressure or palpitations   Respiratory: No SOB or cough Gastrointestinal: See HPI and otherwise negative Genitourinary: No dysuria or change in urinary frequency Neurological: No headache, dizziness or syncope Musculoskeletal: No new muscle or joint pain Hematologic: No bleeding or bruising Psychiatric: No history of depression or anxiety    Physical Exam:  Vital signs: BP 120/82   Pulse 88   Ht 6' 1 (1.854 m)   Wt 220 lb 12.8 oz (100.2 kg)   BMI 29.13 kg/m   Constitutional: NAD, Well developed, Well nourished, alert and cooperative Head:  Normocephalic and atraumatic. Eyes:   PEERL, EOMI. No icterus. Conjunctiva pink. Respiratory: Respirations even and unlabored. Lungs clear to auscultation bilaterally.   No wheezes, crackles, or rhonchi.  Cardiovascular:  Regular rate and rhythm. No peripheral edema, cyanosis or pallor.  Gastrointestinal:  Soft, nondistended, lower abdominal tenderness. No rebound or guarding.  Hypoactive bowel sounds. No appreciable masses or hepatomegaly. Rectal:  Not performed.  Msk:  Symmetrical without gross deformities. Without edema, no deformity or joint abnormality.  Neurologic:  Alert and  oriented x4;  grossly normal neurologically.  Skin:   Dry and intact without significant lesions or rashes. Psychiatric: Oriented to person, place and time. Demonstrates good judgement and reason without abnormal affect or behaviors.   RELEVANT LABS AND IMAGING: CBC    Component Value Date/Time   WBC 11.0 (H) 11/03/2023 2230   RBC 5.53 11/03/2023 2230   HGB 15.0 11/26/2023 0054   HGB 16.6 09/10/2011 0825   HCT 44.0 11/26/2023 0054   HCT 47.0 09/10/2011 0825   PLT 144 (  L) 11/03/2023 2230   PLT 110 (L) 09/10/2011 0825   MCV 83.9 11/03/2023 2230   MCV 84.1 09/10/2011 0825   MCH 29.5 11/03/2023 2230    MCHC 35.1 11/03/2023 2230   RDW 13.9 11/03/2023 2230   RDW 13.4 09/10/2011 0825   LYMPHSABS 1.9 09/26/2023 0645   LYMPHSABS 0.9 09/10/2011 0825   MONOABS 0.5 09/26/2023 0645   MONOABS 0.3 09/10/2011 0825   EOSABS 0.2 09/26/2023 0645   EOSABS 0.1 09/10/2011 0825   BASOSABS 0.0 09/26/2023 0645   BASOSABS 0.0 09/10/2011 0825    CMP     Component Value Date/Time   NA 138 11/26/2023 0054   K 3.8 11/26/2023 0054   CL 99 11/26/2023 0054   CO2 25 11/03/2023 2230   GLUCOSE 199 (H) 11/26/2023 0054   BUN 7 11/26/2023 0054   CREATININE 1.00 11/26/2023 0054   CALCIUM  10.0 11/03/2023 2230   PROT 7.5 11/03/2023 2230   PROT 7.6 01/12/2023 1414   ALBUMIN 4.5 11/03/2023 2230   AST 48 (H) 11/03/2023 2230   ALT 54 (H) 11/03/2023 2230   ALKPHOS 85 11/03/2023 2230   BILITOT 0.7 11/03/2023 2230   GFRNONAA >60 11/03/2023 2230   GFRAA >60 09/14/2019 0408     Assessment/Plan:   Diarrhea of presumed infectious origin Lower abdominal pain 6-month history of diarrhea and lower abdominal pain with history of antibiotic use and he works in a hospital setting.  Cannot rule out infectious etiology.  Also, with physical exam showing hypoactive bowel sounds and presence of multiple polyps on previous colonoscopies cannot rule out overflow diarrhea secondary to obstructive polyps.  But this is less likely as he seldom has a formed stool.  Additionally, since this has been an issue for him in the past and at that time workup was negative, query if could be IBS/gut brain axis disorder in which case if his extensive workup is negative we could consider amitriptyline . - GI pathogen panel with C. difficile Diatherix - Pending results, if negative will proceed with colonoscopy for further evaluation since he is due for repeat secondary to numerous polyps - Pancreatic fecal elastase - KUB to rule out constipation - Can use dicyclomine  as needed - CBC, CMP, TSH  History of colonic polyps History of multiple  colonic polyps on colonoscopy in 2023 and 2024 biopsied as tubular adenomas.  1 polyp being greater than 2 cm.  Patient did not follow through with genetic testing as advised.  He is due for repeat April 2025.  Though with the above symptoms I think it is reasonable to get the colonoscopy done early specially since he had an adequate prep - If infectious workup is negative proceed with colonoscopy - 2-day prep - Will schedule based on infectious workup results - Continue with genetic testing as recommended  Elevated LFTs Mildly elevated LFTs and hepatocellular pattern on recent ED visit 12/10 with CT showing no liver abnormality - Recheck LFTs  Nestor Mollie [facility]ON[facility] 12/01/2023, 9:44 AM  Cc: Berneta Elsie Sim DEWAINE

## 2023-12-01 NOTE — Patient Instructions (Addendum)
 Your provider has requested that you go to the basement level for lab work before leaving today. Press B on the elevator. The lab is located at the first door on the left as you exit the elevator.  Due to recent changes in healthcare laws, you may see the results of your imaging and laboratory studies on MyChart before your provider has had a chance to review them.  We understand that in some cases there may be results that are confusing or concerning to you. Not all laboratory results come back in the same time frame and the provider may be waiting for multiple results in order to interpret others.  Please give us  48 hours in order for your provider to thoroughly review all the results before contacting the office for clarification of your results.   We have sent the following medications to your pharmacy for you to pick up at your convenience: Generic Bentyl    Please go by the x-ray department before leaving in the basement for an abdominal x-ray.  Your provider has ordered Diatherix stool testing for you. You have received a kit from our office today containing all necessary supplies to complete this test. Please carefully read the stool collection instructions provided in the kit before opening the accompanying materials. In addition, be sure to place the label from the top right corner of the laboratory request sheet onto the puritan opti-swab tube that is supplied in the kit. This label should include your full name and date of birth. After completing the test, you should secure the purtian tube into the specimen biohazard bag. The laboratory request information sheet (including date and time of specimen collection) should be placed into the outside pocket of the specimen biohazard bag and returned to the [facility] lab with 2 days of collection.   If the laboratory information sheet specimen date and time are not filled out, the test will NOT be performed.  I appreciate the opportunity to  care for you. Con Blower, PA-C

## 2023-12-02 ENCOUNTER — Other Ambulatory Visit: Payer: Self-pay | Admitting: *Deleted

## 2023-12-02 ENCOUNTER — Telehealth: Payer: Self-pay | Admitting: Gastroenterology

## 2023-12-02 DIAGNOSIS — R7989 Other specified abnormal findings of blood chemistry: Secondary | ICD-10-CM

## 2023-12-02 NOTE — Progress Notes (Signed)
:   ANA, AMA, IgG, asma, ceruloplasim, alpha-1, iron, ferritin, Hep B surface antigen, Hep B surface antibody, Hep A antibody, HCV ab, PT/INR

## 2023-12-02 NOTE — Telephone Encounter (Signed)
 Patient is returning your call.

## 2023-12-03 NOTE — Telephone Encounter (Signed)
 See 12/01/23 lab result notes for additional information.

## 2023-12-05 LAB — PANCREATIC ELASTASE, FECAL: Pancreatic Elastase-1, Stool: >800 ug/g (ref >200)

## 2023-12-07 NOTE — Progress Notes (Signed)
 Agree with the assessment and plan as outlined by Boone Master, PA-C.  High degree of suspicion for IBS given chronicity of symptoms and concomitant anxiety.  Would suggest trial of TCA if patient is amenable.

## 2023-12-11 ENCOUNTER — Other Ambulatory Visit: Payer: Self-pay

## 2023-12-11 ENCOUNTER — Other Ambulatory Visit: Payer: Self-pay | Admitting: *Deleted

## 2023-12-11 ENCOUNTER — Other Ambulatory Visit (HOSPITAL_COMMUNITY): Payer: Self-pay

## 2023-12-11 ENCOUNTER — Emergency Department (HOSPITAL_BASED_OUTPATIENT_CLINIC_OR_DEPARTMENT_OTHER)
Admission: EM | Admit: 2023-12-11 | Discharge: 2023-12-11 | Disposition: A | Discharge status: Home / Self Care | Payer: Commercial Managed Care - PPO

## 2023-12-11 ENCOUNTER — Emergency Department (HOSPITAL_BASED_OUTPATIENT_CLINIC_OR_DEPARTMENT_OTHER): Payer: Commercial Managed Care - PPO

## 2023-12-11 ENCOUNTER — Encounter (HOSPITAL_BASED_OUTPATIENT_CLINIC_OR_DEPARTMENT_OTHER): Payer: Self-pay

## 2023-12-11 DIAGNOSIS — R1030 Lower abdominal pain, unspecified: Secondary | ICD-10-CM

## 2023-12-11 DIAGNOSIS — R197 Diarrhea, unspecified: Secondary | ICD-10-CM | POA: Insufficient documentation

## 2023-12-11 DIAGNOSIS — R112 Nausea with vomiting, unspecified: Secondary | ICD-10-CM | POA: Diagnosis not present

## 2023-12-11 DIAGNOSIS — R935 Abnormal findings on diagnostic imaging of other abdominal regions, including retroperitoneum: Secondary | ICD-10-CM

## 2023-12-11 DIAGNOSIS — R9431 Abnormal electrocardiogram [ECG] [EKG]: Secondary | ICD-10-CM | POA: Diagnosis not present

## 2023-12-11 DIAGNOSIS — R1032 Left lower quadrant pain: Secondary | ICD-10-CM | POA: Diagnosis not present

## 2023-12-11 DIAGNOSIS — R109 Unspecified abdominal pain: Secondary | ICD-10-CM | POA: Diagnosis not present

## 2023-12-11 DIAGNOSIS — F17210 Nicotine dependence, cigarettes, uncomplicated: Secondary | ICD-10-CM | POA: Insufficient documentation

## 2023-12-11 DIAGNOSIS — K4090 Unilateral inguinal hernia, without obstruction or gangrene, not specified as recurrent: Secondary | ICD-10-CM | POA: Diagnosis not present

## 2023-12-11 LAB — COMPREHENSIVE METABOLIC PANEL
ALT: 68 U/L — ABNORMAL HIGH (ref 0–44)
AST: 33 U/L (ref 15–41)
Albumin: 4.8 g/dL (ref 3.5–5.0)
Alkaline Phosphatase: 70 U/L (ref 38–126)
Anion gap: 10 (ref 5–15)
BUN: 15 mg/dL (ref 6–20)
CO2: 25 mmol/L (ref 22–32)
Calcium: 9.6 mg/dL (ref 8.9–10.3)
Chloride: 101 mmol/L (ref 98–111)
Creatinine, Ser: 0.66 mg/dL (ref 0.61–1.24)
GFR, Estimated: >60 mL/min (ref >60)
Glucose, Bld: 127 mg/dL — ABNORMAL HIGH (ref 70–99)
Potassium: 3.4 mmol/L — ABNORMAL LOW (ref 3.5–5.1)
Sodium: 136 mmol/L (ref 135–145)
Total Bilirubin: 0.7 mg/dL (ref 0.0–1.2)
Total Protein: 7.4 g/dL (ref 6.5–8.1)

## 2023-12-11 LAB — CBC
HCT: 42.3 % (ref 39.0–52.0)
Hemoglobin: 15.2 g/dL (ref 13.0–17.0)
MCH: 29.5 pg (ref 26.0–34.0)
MCHC: 35.9 g/dL (ref 30.0–36.0)
MCV: 82.1 fL (ref 80.0–100.0)
Platelets: 108 10*3/uL — ABNORMAL LOW (ref 150–400)
RBC: 5.15 MIL/uL (ref 4.22–5.81)
RDW: 13.7 % (ref 11.5–15.5)
WBC: 9.1 10*3/uL (ref 4.0–10.5)
nRBC: 0 % (ref 0.0–0.2)

## 2023-12-11 LAB — URINALYSIS, ROUTINE W REFLEX MICROSCOPIC
Bilirubin Urine: NEGATIVE
Glucose, UA: NEGATIVE mg/dL
Hgb urine dipstick: NEGATIVE
Ketones, ur: NEGATIVE mg/dL
Leukocytes,Ua: NEGATIVE
Nitrite: NEGATIVE
Specific Gravity, Urine: >1.046 — ABNORMAL HIGH (ref 1.005–1.030)
pH: 5.5 (ref 5.0–8.0)

## 2023-12-11 LAB — LIPASE, BLOOD: Lipase: 34 U/L (ref 11–51)

## 2023-12-11 LAB — MAGNESIUM: Magnesium: 1.9 mg/dL (ref 1.7–2.4)

## 2023-12-11 MED ORDER — ONDANSETRON 4 MG PO TBDP
4.0000 mg | ORAL_TABLET | Freq: Three times a day (TID) | ORAL | 0 refills | Status: DC | PRN
  Filled 2023-12-11: qty 20, 7d supply, fill #0

## 2023-12-11 MED ORDER — ONDANSETRON HCL 4 MG/2ML IJ SOLN
4.0000 mg | Freq: Once | INTRAMUSCULAR | Status: AC
  Administered 2023-12-11: 4 mg via INTRAVENOUS

## 2023-12-11 MED ORDER — FENTANYL CITRATE PF 50 MCG/ML IJ SOSY
25.0000 ug | PREFILLED_SYRINGE | Freq: Once | INTRAMUSCULAR | Status: AC
  Administered 2023-12-11: 25 ug via INTRAVENOUS
  Filled 2023-12-11: qty 1

## 2023-12-11 MED ORDER — IOHEXOL 300 MG/ML  SOLN
100.0000 mL | Freq: Once | INTRAMUSCULAR | Status: AC | PRN
  Administered 2023-12-11: 100 mL via INTRAVENOUS

## 2023-12-11 MED ORDER — MORPHINE SULFATE (PF) 4 MG/ML IV SOLN
4.0000 mg | Freq: Once | INTRAVENOUS | Status: AC
  Administered 2023-12-11: 4 mg via INTRAVENOUS
  Filled 2023-12-11: qty 1

## 2023-12-11 MED ORDER — ONDANSETRON HCL 4 MG/2ML IJ SOLN
INTRAMUSCULAR | Status: AC
  Filled 2023-12-11: qty 2

## 2023-12-11 MED ORDER — ONDANSETRON 4 MG PO TBDP: 4.0000 mg | ORAL_TABLET | Freq: Three times a day (TID) | ORAL | 0 refills | Status: DC | PRN

## 2023-12-11 MED ORDER — ONDANSETRON 4 MG PO TBDP
4.0000 mg | ORAL_TABLET | Freq: Once | ORAL | Status: AC | PRN
  Administered 2023-12-11: 4 mg via ORAL
  Filled 2023-12-11: qty 1

## 2023-12-11 NOTE — ED Triage Notes (Signed)
Pt c/o abd pain, NVD. Advised by [facility] that he has a blockage. States symptoms have "been going on for months." LBM today, diarrhea. Currently nauseous in triage.

## 2023-12-11 NOTE — Discharge Instructions (Signed)
As discussed, CT scan appeared normal.  Continue to use dicyclomine as needed for abdominal discomfort and follow-up with GI in the outpatient setting.  Please not hesitate to return to emergency department if the worrisome signs and symptoms we discussed become apparent.

## 2023-12-11 NOTE — ED Provider Notes (Signed)
[facility] AT [facility] Provider Note   CSN: [id number] Arrival date & time: 12/11/23  1135     History  Chief Complaint  Patient presents with   Abdominal Pain   Emesis   Nausea   Diarrhea    Alan Sanders is a 53 y.o. male.   Abdominal Pain Associated symptoms: diarrhea and vomiting   Emesis Associated symptoms: abdominal pain and diarrhea   Diarrhea Associated symptoms: abdominal pain and vomiting     53 year old male presents to the emergency department complaints of abdominal pain, nausea, vomiting, diarrhea.  States that symptoms have been present for the past 3 weeks.  Has seen gastroenterology in the outpatient setting on the seventh of this month and was sent home with dicyclomine to use as needed.  Plan was to follow GI panel performed plus or minus colonoscopy pending GI panel results.  Patient reports continued symptoms prompting visit to the emergency department today.  Reports pain in the left lower abdomen without radiation.  States that pain is worsened with certain movements and relieved with rest.  Does report some associated nausea, vomiting as well as loose bowel movements.  Denies any hematemesis, melena/hematochezia.  Denies any urinary symptoms.  Past medical history significant for seizure, thrombocytopenia, GERD, left-sided inguinal hernia with repair 15+ years ago,  Home Medications Prior to Admission medications   Medication Sig Start Date End Date Taking? Authorizing Provider  albuterol (VENTOLIN HFA) 108 (90 Base) MCG/ACT inhaler Inhale 1-2 puffs into the lungs every 6 (six) hours as needed for wheezing or shortness of breath. Patient taking differently: Inhale 2-3 puffs into the lungs as needed for wheezing or shortness of breath. 07/18/22   Loyola Mast, MD  amoxicillin-clavulanate (AUGMENTIN) 875-125 MG tablet Take 1 tablet by mouth every 12 (twelve) hours. 11/04/23   Achille Rich, PA-C  apixaban (ELIQUIS) 2.5  MG TABS tablet Take 1 tablet (2.5 mg total) by mouth 2 (two) times daily. 03/26/23 04/25/23  Montez Morita, PA-C  ascorbic acid (VITAMIN C) 1000 MG tablet Take 1 tablet (1,000 mg total) by mouth daily. 03/27/23   Montez Morita, PA-C  Cholecalciferol 125 MCG (5000 UT) TABS Take 1 tablet (5,000 Units total) by mouth daily. 03/26/23   Montez Morita, PA-C  dicyclomine (BENTYL) 20 MG tablet Take 1 tablet (20 mg total) by mouth 2 (two) times daily as needed for spasms. 12/01/23   McMichael, Saddie Benders, PA-C  docusate sodium (COLACE) 100 MG capsule Take 1 capsule (100 mg total) by mouth 2 (two) times daily. 03/26/23   Montez Morita, PA-C  escitalopram (LEXAPRO) 10 MG tablet Take 2 tablets (20 mg total) by mouth daily. 06/15/23   Mliss Sax, MD  gabapentin (NEURONTIN) 300 MG capsule Take 1 capsule (300 mg total) by mouth 3 (three) times daily. 08/10/23   Mliss Sax, MD  ondansetron (ZOFRAN-ODT) 4 MG disintegrating tablet Take 1 tablet (4 mg total) by mouth every 8 (eight) hours as needed. 12/11/23   Peter Garter, PA  oxyCODONE (ROXICODONE) 5 MG immediate release tablet Take 1 tablet (5 mg total) by mouth every 4 (four) hours as needed for severe pain (pain score 7-10). 11/04/23   Achille Rich, PA-C  pantoprazole (PROTONIX) 40 MG tablet Take 1 tablet (40 mg total) by mouth daily. 02/23/23 03/25/23  Jenel Lucks, MD  tamsulosin (FLOMAX) 0.4 MG CAPS capsule Take 1 capsule (0.4 mg total) by mouth daily after breakfast. 11/04/23   Achille Rich, PA-C  Vitamin D, Ergocalciferol, (DRISDOL) 1.25 MG (50000 UNIT) CAPS capsule Take 1 capsule (50,000 Units total) by mouth every 7 (seven) days. 03/26/23   Montez Morita, PA-C      Allergies    Hydromorphone    Review of Systems   Review of Systems  Gastrointestinal:  Positive for abdominal pain, diarrhea and vomiting.  All other systems reviewed and are negative.   Physical Exam Updated Vital Signs BP (!) 126/91   Pulse 71   Temp 97.6 F (36.4 C) (Oral)    Resp 18   SpO2 96%  Physical Exam Vitals and nursing note reviewed.  Constitutional:      General: He is not in acute distress.    Appearance: He is well-developed.  HENT:     Head: Normocephalic and atraumatic.  Eyes:     Conjunctiva/sclera: Conjunctivae normal.  Cardiovascular:     Rate and Rhythm: Normal rate and regular rhythm.     Heart sounds: No murmur heard. Pulmonary:     Effort: Pulmonary effort is normal. No respiratory distress.     Breath sounds: Normal breath sounds.  Abdominal:     Palpations: Abdomen is soft.     Tenderness: There is abdominal tenderness in the left lower quadrant. There is no right CVA tenderness, left CVA tenderness or guarding.  Musculoskeletal:        General: No swelling.     Cervical back: Neck supple.  Skin:    General: Skin is warm and dry.     Capillary Refill: Capillary refill takes less than 2 seconds.  Neurological:     Mental Status: He is alert.  Psychiatric:        Mood and Affect: Mood normal.     ED Results / Procedures / Treatments   Labs (all labs ordered are listed, but only abnormal results are displayed) Labs Reviewed  COMPREHENSIVE METABOLIC PANEL - Abnormal; Notable for the following components:      Result Value   Potassium 3.4 (*)    Glucose, Bld 127 (*)    ALT 68 (*)    All other components within normal limits  CBC - Abnormal; Notable for the following components:   Platelets 108 (*)    All other components within normal limits  URINALYSIS, ROUTINE W REFLEX MICROSCOPIC - Abnormal; Notable for the following components:   Specific Gravity, Urine >1.046 (*)    Protein, ur TRACE (*)    All other components within normal limits  GASTROINTESTINAL PANEL BY PCR, STOOL (REPLACES STOOL CULTURE)  C DIFFICILE QUICK SCREEN W PCR REFLEX    LIPASE, BLOOD  MAGNESIUM    EKG EKG Interpretation Date/Time:  Friday December 11 2023 11:44:41 EST Ventricular Rate:  98 PR Interval:  142 QRS Duration:  76 QT  Interval:  344 QTC Calculation: 439 R Axis:   75  Text Interpretation: Normal sinus rhythm Normal ECG When compared with ECG of 26-Nov-2023 00:46, PREVIOUS ECG IS PRESENT Confirmed by Beckey Downing [phone]) on 12/11/2023 12:37:19 PM  Radiology CT ABDOMEN PELVIS W CONTRAST Result Date: 12/11/2023 CLINICAL DATA:  Abdominal pain, acute, nonlocalized. Diarrhea. Nausea. EXAM: CT ABDOMEN AND PELVIS WITH CONTRAST TECHNIQUE: Multidetector CT imaging of the abdomen and pelvis was performed using the standard protocol following bolus administration of intravenous contrast. RADIATION DOSE REDUCTION: This exam was performed according to the departmental dose-optimization program which includes automated exposure control, adjustment of the mA and/or kV according to patient size and/or use of iterative reconstruction technique. CONTRAST:   OMNIPAQUE IOHEXOL 300 MG/ML  SOLN COMPARISON:  CT scan abdomen and pelvis from 11/03/2023. FINDINGS: Lower chest: There are subpleural atelectatic changes in the visualized lung bases. No overt consolidation. No pleural effusion. The heart is normal in size. No pericardial effusion. Hepatobiliary: The liver is normal in size. Non-cirrhotic configuration. No suspicious mass. No intrahepatic or extrahepatic bile duct dilation. No calcified gallstones. Normal gallbladder wall thickness. No pericholecystic inflammatory changes. Pancreas: Unremarkable. No pancreatic ductal dilatation or surrounding inflammatory changes. Spleen: Within normal limits. No focal lesion. Adrenals/Urinary Tract: Adrenal glands are unremarkable. No suspicious renal mass. No hydronephrosis. No renal or ureteric calculi. Unremarkable urinary bladder. Stomach/Bowel: No disproportionate dilation of the small or large bowel loops. No evidence of abnormal bowel wall thickening or inflammatory changes. The appendix is unremarkable. Vascular/Lymphatic: No ascites or pneumoperitoneum. No abdominal or pelvic  lymphadenopathy, by size criteria. No aneurysmal dilation of the major abdominal arteries. Reproductive: Normal size prostate. Symmetric seminal vesicles. Other: There are small fat containing umbilical and left inguinal hernias. There are metallic anchors in the right inguinal region, likely from prior hernia repair. No recurrent hernia seen. The soft tissues and abdominal wall are otherwise unremarkable. Musculoskeletal: No suspicious osseous lesions. There are mild multilevel degenerative changes in the visualized spine. IMPRESSION: 1. No acute inflammatory process identified within the abdomen or pelvis. 2. No bowel obstruction. 3. Multiple other nonacute observations, as described above. Electronically Signed   By: Jules Schick M.D.   On: 12/11/2023 15:49    Procedures Procedures    Medications Ordered in ED Medications  ondansetron (ZOFRAN) 4 MG/2ML injection (  Not Given 12/11/23 1432)  ondansetron (ZOFRAN-ODT) disintegrating tablet 4 mg (4 mg Oral Given 12/11/23 1148)  iohexol (OMNIPAQUE) 300 MG/ML solution 100 mL (100 mLs Intravenous Contrast Given 12/11/23 1433)  morphine (PF) 4 MG/ML injection 4 mg (4 mg Intravenous Given 12/11/23 1336)  ondansetron (ZOFRAN) injection 4 mg (4 mg Intravenous Given 12/11/23 1335)  fentaNYL (SUBLIMAZE) injection 25 mcg (25 mcg Intravenous Given 12/11/23 1511)    ED Course/ Medical Decision Making/ A&P                                 Medical Decision Making Amount and/or Complexity of Data Reviewed Labs: ordered. Radiology: ordered.  Risk Prescription drug management.   This patient presents to the ED for concern of abdominal pain, this involves an extensive number of treatment options, and is a complaint that carries with it a high risk of complications and morbidity.  The differential diagnosis includes gastritis, PUD cholecystitis, CBD pathology, SBO/LBO, volvulus, diverticulitis, appendicitis, foodborne illness, medication side effect,  diverticulosis, malignancy, pyelonephritis, nephrolithiasis, cystitis, other   Co morbidities that complicate the patient evaluation  See HPI   Additional history obtained:  Additional history obtained from EMR External records from outside source obtained and reviewed including hospital records   Lab Tests:  I Ordered, and personally interpreted labs.  The pertinent results include: Mild hypokalemia 3.4 otherwise, otherwise within normal limits.  Elevation of ALT of 68.  No renal dysfunction.  No leukocytosis.  No evidence of anemia.  Thrombocytopenia of 108.  Lipase within limits.  UA with trace proteins but otherwise unremarkable.   Imaging Studies ordered:  I ordered imaging studies including CT abdomen pelvis I independently visualized and interpreted imaging which showed no acute abnormality. I agree with the radiologist interpretation   Cardiac Monitoring: / EKG:  The patient was  maintained on a cardiac monitor.  I personally viewed and interpreted the cardiac monitored which showed an underlying rhythm of: Sinus rhythm   Consultations Obtained:  N/a   Problem List / ED Course / Critical interventions / Medication management  Abdominal pain, nausea, vomiting, diarrhea I ordered medication including Zofran, morphine, fentanyl  Reevaluation of the patient after these medicines showed that the patient improved I have reviewed the patients home medicines and have made adjustments as needed   Social Determinants of Health:  Chronic cigarette use.  Denies illicit drug use   Test / Admission - Considered:  Abdominal pain, nausea, vomiting, diarrhea Vitals signs within normal range and stable throughout visit. Laboratory/imaging studies significant for: see above 53 year old male presents emergency department complaints of abdominal pain, nausea, vomiting, diarrhea.  Symptoms present for the past 3 weeks.  Has follow-up with GI in the outpatient setting and has  reported stool sampling that was negative and has planned colonoscopy upcoming regarding symptoms.  On exam, patient with tenderness diffusely lower abdomen.  Labs reassuring from an acute emergent process.  CT imaging without any acute intra-abdominal pathology.  Unsure of exact etiology of patient's abdominal pain but seems not to be emergent process.  Patient with reported history of IBS using Bentyl as needed.  Recommend continued use of Bentyl in the outpatient setting.  Will prescribe antiemetic as patient states that he does not have any left.  Will recommend close follow-up with GI in the outpatient setting for further assessment/evaluation.  Treatment plan discussed at length with patient and he acknowledged understanding was agreeable to said plan.  Patient overall well-appearing, afebrile in no acute distress. Worrisome signs and symptoms were discussed with the patient, and the patient acknowledged understanding to return to the ED if noticed. Patient was stable upon discharge.          Final Clinical Impression(s) / ED Diagnoses Final diagnoses:  Lower abdominal pain  Nausea vomiting and diarrhea    Rx / DC Orders ED Discharge Orders          Ordered    ondansetron (ZOFRAN-ODT) 4 MG disintegrating tablet  Every 8 hours PRN,   Status:  Discontinued        12/11/23 1636    ondansetron (ZOFRAN-ODT) 4 MG disintegrating tablet  Every 8 hours PRN        12/11/23 1641              Peter Garter, [place] 12/11/23 1836    Durwin Glaze, MD 12/14/23 0730

## 2023-12-11 NOTE — ED Notes (Signed)
Patient transported to CT 

## 2023-12-14 ENCOUNTER — Encounter: Payer: Self-pay | Admitting: Gastroenterology

## 2023-12-14 ENCOUNTER — Telehealth: Payer: Self-pay

## 2023-12-14 NOTE — Transitions of Care (Post Inpatient/ED Visit) (Unsigned)
   12/14/2023  Name: Alan Sanders MRN: [id number] DOB: 11-Oct-1971  Today's TOC FU Call Status: Today's TOC FU Call Status:: Unsuccessful Call (1st Attempt) Unsuccessful Call (1st Attempt) Date: 12/14/23  Attempted to reach the patient regarding the most recent Inpatient/ED visit.  Follow Up Plan: Additional outreach attempts will be made to reach the patient to complete the Transitions of Care (Post Inpatient/ED visit) call.   Signature Arvil Persons, BSN, Charity fundraiser

## 2023-12-15 ENCOUNTER — Ambulatory Visit (HOSPITAL_COMMUNITY): Payer: Commercial Managed Care - PPO

## 2023-12-15 NOTE — Telephone Encounter (Unsigned)
Copied from CRM [id number]. Topic: General - Other >> Dec 14, 2023  4:45 PM Denese Killings wrote: Reason for CRM: Patient is returning a call from someone in the office. Please call patient back.

## 2023-12-15 NOTE — Transitions of Care (Post Inpatient/ED Visit) (Unsigned)
   12/15/2023  Name: Alan Sanders MRN: [id number] DOB: [birth year]  Today's TOC FU Call Status: Today's TOC FU Call Status:: Unsuccessful Call (2nd Attempt) Unsuccessful Call (1st Attempt) Date: 12/14/23 Unsuccessful Call (2nd Attempt) Date: 12/15/23  Attempted to reach the patient regarding the most recent Inpatient/ED visit.  Follow Up Plan: Additional outreach attempts will be made to reach the patient to complete the Transitions of Care (Post Inpatient/ED visit) call.   Signature Arvil Persons, BSN, Charity fundraiser

## 2023-12-17 ENCOUNTER — Ambulatory Visit (HOSPITAL_COMMUNITY)
Admission: RE | Admit: 2023-12-17 | Discharge: 2023-12-17 | Disposition: A | Discharge status: Home / Self Care | Payer: Commercial Managed Care - PPO | Source: Ambulatory Visit | Attending: Gastroenterology | Admitting: Gastroenterology

## 2023-12-17 DIAGNOSIS — R112 Nausea with vomiting, unspecified: Secondary | ICD-10-CM | POA: Diagnosis not present

## 2023-12-17 DIAGNOSIS — R109 Unspecified abdominal pain: Secondary | ICD-10-CM | POA: Diagnosis not present

## 2023-12-17 DIAGNOSIS — R197 Diarrhea, unspecified: Secondary | ICD-10-CM | POA: Insufficient documentation

## 2023-12-17 DIAGNOSIS — R1030 Lower abdominal pain, unspecified: Secondary | ICD-10-CM | POA: Insufficient documentation

## 2023-12-17 DIAGNOSIS — N400 Enlarged prostate without lower urinary tract symptoms: Secondary | ICD-10-CM | POA: Diagnosis not present

## 2023-12-17 DIAGNOSIS — R935 Abnormal findings on diagnostic imaging of other abdominal regions, including retroperitoneum: Secondary | ICD-10-CM | POA: Insufficient documentation

## 2023-12-17 MED ORDER — IOHEXOL 300 MG/ML  SOLN
30.0000 mL | Freq: Once | INTRAMUSCULAR | Status: AC | PRN
  Administered 2023-12-17: 30 mL via ORAL

## 2023-12-17 MED ORDER — IOHEXOL 300 MG/ML  SOLN
100.0000 mL | Freq: Once | INTRAMUSCULAR | Status: AC | PRN
  Administered 2023-12-17: 100 mL via INTRAVENOUS

## 2023-12-17 NOTE — Transitions of Care (Post Inpatient/ED Visit) (Signed)
   12/17/2023  Name: Alan Sanders MRN: [id number] DOB: 09-Jul-1971  Today's TOC FU Call Status: Today's TOC FU Call Status:: Successful TOC FU Call Completed Unsuccessful Call (1st Attempt) Date: 12/14/23 Unsuccessful Call (2nd Attempt) Date: 12/15/23 Unsuccessful Call (3rd Attempt) Date: 12/17/23 [facility] FU Call Complete Date: 12/17/23 Patient's Name and Date of Birth confirmed.  Transition Care Management Follow-up Telephone Call Date of Discharge: 12/11/23 Discharge Facility: [facility] ([facility]) Type of Discharge: Emergency Department How have you been since you were released from the hospital?: Same Any questions or concerns?: No  Items Reviewed: Did you receive and understand the discharge instructions provided?: Yes Any new allergies since your discharge?: No Dietary orders reviewed?: Yes Do you have support at home?: Yes  Medications Reviewed Today: Medications Reviewed Today   Medications were not reviewed in this encounter     Home Care and Equipment/Supplies: Were Home Health Services Ordered?: NA Any new equipment or medical supplies ordered?: NA  Functional Questionnaire: Do you need assistance with bathing/showering or dressing?: No Do you need assistance with meal preparation?: No Do you need assistance with eating?: No Do you have difficulty maintaining continence: No Do you need assistance with getting out of bed/getting out of a chair/moving?: No Do you have difficulty managing or taking your medications?: No  Follow up appointments reviewed: PCP Follow-up appointment confirmed?: NA Specialist Hospital Follow-up appointment confirmed?: NA Do you need transportation to your follow-up appointment?: No Do you understand care options if your condition(s) worsen?: Yes-patient verbalized understanding    SIGNATURE Arvil Persons, BSN, RN

## 2023-12-17 NOTE — Transitions of Care (Post Inpatient/ED Visit) (Signed)
   12/17/2023  Name: Alan Sanders MRN: [id number] DOB: 11-Dec-1970  Today's TOC FU Call Status: Today's TOC FU Call Status:: Unsuccessful Call (3rd Attempt) Unsuccessful Call (1st Attempt) Date: 12/14/23 Unsuccessful Call (2nd Attempt) Date: 12/15/23 Unsuccessful Call (3rd Attempt) Date: 12/17/23  Attempted to reach the patient regarding the most recent Inpatient/ED visit.  Follow Up Plan: No further outreach attempts will be made at this time. We have been unable to contact the patient.  Signature Arvil Persons, BSN, Charity fundraiser

## 2023-12-22 ENCOUNTER — Telehealth: Payer: Self-pay | Admitting: Gastroenterology

## 2023-12-22 DIAGNOSIS — R1030 Lower abdominal pain, unspecified: Secondary | ICD-10-CM

## 2023-12-22 DIAGNOSIS — R197 Diarrhea, unspecified: Secondary | ICD-10-CM

## 2023-12-22 NOTE — Telephone Encounter (Signed)
Patient calls with continued severe diarrhea and abdominal pain. He states that the dicyclomine twice daily prn is not helping his symptoms and he would like to know if there is anything else we could prescribe. Patient's CT from 12/17/23 has not yet resulted. I did ask radiology to expedite reading to insure nothing emergent.

## 2023-12-22 NOTE — Telephone Encounter (Signed)
PT has diarrhea and severe stomach pains. He is requesting a prescription to be sent In to [facility] in [place]. Please advise.

## 2023-12-23 ENCOUNTER — Other Ambulatory Visit: Payer: Self-pay | Admitting: Gastroenterology

## 2023-12-23 NOTE — Telephone Encounter (Signed)
Would it be appropriate to get patient scheduled for colonoscopy as suggested in your last office note? Does not look like he has been scheduled for that yet.

## 2023-12-24 ENCOUNTER — Encounter: Payer: Self-pay | Admitting: Gastroenterology

## 2023-12-24 MED ORDER — NA SULFATE-K SULFATE-MG SULF 17.5-3.13-1.6 GM/177ML PO SOLN: ORAL | 0 refills | Status: DC

## 2023-12-24 NOTE — Telephone Encounter (Signed)
Left message for patient to call back

## 2023-12-24 NOTE — Telephone Encounter (Signed)
I have spoken to patient to advise of Alan McMichael, PA-C recommendation to increase dicyclomine to 4 times daily dosing. I have also discussed recommendations to move forward with colonoscopy for further evaluation of symptoms. Patient is advised that CT showed only enlarged prostate which we have already advised him to discuss with his PCP about.  Patient scheduled colonoscopy. Patient has been advised of time/date/location for upcoming procedure and has been given generalized verbal prep instructions. Discussed that a care partner 18 years or older should bring him, stay for the procedure and drive home due to sedation. Written instructions have been made available to the patient for additional review via mchart.  Of note: Eliquis was on medication list. Patient tells me he has not taken this in months and he was on this for a short time due to a leg fracture. Medication has been taken off of current medication list.

## 2023-12-26 ENCOUNTER — Other Ambulatory Visit: Payer: Self-pay | Admitting: Family Medicine

## 2023-12-26 DIAGNOSIS — M79671 Pain in right foot: Secondary | ICD-10-CM

## 2023-12-30 ENCOUNTER — Encounter: Payer: Self-pay | Admitting: Gastroenterology

## 2023-12-30 ENCOUNTER — Ambulatory Visit: Payer: Commercial Managed Care - PPO | Admitting: Gastroenterology

## 2023-12-30 VITALS — BP 117/64 | HR 61 | Temp 97.6°F | Resp 17 | Ht 73.0 in | Wt 220.0 lb

## 2023-12-30 DIAGNOSIS — Z1211 Encounter for screening for malignant neoplasm of colon: Secondary | ICD-10-CM | POA: Diagnosis not present

## 2023-12-30 DIAGNOSIS — R197 Diarrhea, unspecified: Secondary | ICD-10-CM

## 2023-12-30 DIAGNOSIS — Z860100 Personal history of colon polyps, unspecified: Secondary | ICD-10-CM | POA: Diagnosis not present

## 2023-12-30 DIAGNOSIS — D123 Benign neoplasm of transverse colon: Secondary | ICD-10-CM | POA: Diagnosis not present

## 2023-12-30 DIAGNOSIS — K5730 Diverticulosis of large intestine without perforation or abscess without bleeding: Secondary | ICD-10-CM | POA: Diagnosis not present

## 2023-12-30 MED ORDER — SODIUM CHLORIDE 0.9 % IV SOLN: 500.0000 mL | Freq: Once | INTRAVENOUS | Status: DC

## 2023-12-30 MED ORDER — DICYCLOMINE HCL 20 MG PO TABS: 20.0000 mg | ORAL_TABLET | Freq: Four times a day (QID) | ORAL | 1 refills | Status: DC | PRN

## 2023-12-30 NOTE — Progress Notes (Signed)
 Called to room to assist during endoscopic procedure.  Patient ID and intended procedure confirmed with present staff. Received instructions for my participation in the procedure from the performing physician.

## 2023-12-30 NOTE — Op Note (Signed)
 [facility] Patient Name: Bobie Kistler Procedure Date: 12/30/2023 1:14 PM MRN: [id number] Endoscopist: Glendia E. Stacia , MD, [id number] Age: 53 Referring MD:  Date of Birth: 1971/03/05 Gender: Male Account #: [id number] Procedure:                Colonoscopy Indications:              Surveillance: Personal history of adenomatous                            polyps, inadequate prep on last colonoscopy (less                            than 1 year ago): Numerous polyps including large                            distal transvere polyp removed in piecemeal in                            2023. Repeat colonoscopy in May 2024 with multiple                            additional polyps, but prep inadequate. Medicines:                Monitored Anesthesia Care Procedure:                Pre-Anesthesia Assessment:                           - Prior to the procedure, a History and Physical                            was performed, and patient medications and                            allergies were reviewed. The patient's tolerance of                            previous anesthesia was also reviewed. The risks                            and benefits of the procedure and the sedation                            options and risks were discussed with the patient.                            All questions were answered, and informed consent                            was obtained. Prior Anticoagulants: The patient has                            taken no anticoagulant or antiplatelet agents. ASA  Grade Assessment: II - A patient with mild systemic                            disease. After reviewing the risks and benefits,                            the patient was deemed in satisfactory condition to                            undergo the procedure.                           After obtaining informed consent, the colonoscope                            was passed under  direct vision. Throughout the                            procedure, the patient's blood pressure, pulse, and                            oxygen saturations were monitored continuously. The                            Olympus Scope [id number] was introduced through the                            anus and advanced to the the terminal ileum, with                            identification of the appendiceal orifice and IC                            valve. The colonoscopy was performed without                            difficulty. The patient tolerated the procedure                            well. The quality of the bowel preparation was                            adequate. The terminal ileum, ileocecal valve,                            appendiceal orifice, and rectum were photographed.                            The bowel preparation used was SUPREP via split                            dose instruction. Scope In: 1:24:13 PM Scope Out: 2:02:21 PM Scope Withdrawal Time: 0 hours 32 minutes 27 seconds  Total Procedure Duration: 0 hours  38 minutes 8 seconds  Findings:                 The perianal and digital rectal examinations were                            normal. Pertinent negatives include skin irritation                            secondary to bowel prep.                           The digital rectal exam was normal. Pertinent                            negatives include normal sphincter tone and no                            palpable rectal lesions.                           A 4 mm polyp was found in the hepatic flexure. The                            polyp was sessile. The polyp was removed with a                            cold snare. Resection and retrieval were complete.                            Estimated blood loss was minimal.                           Four sessile polyps were found in the transverse                            colon. The polyps were 3 to 4 mm in size. These                             polyps were removed with a cold snare. Resection                            and retrieval were complete. Estimated blood loss                            was minimal.                           A tattoo was seen in the distal transverse colon.                            The tattoo site appeared normal. A definitive                            polypectomy scar was not  seen in this viscinity                            despite a careful examination of this area for                            several minutes.                           A few small-mouthed diverticula were found in the                            descending colon. There was no evidence of                            diverticular bleeding.                           The exam was otherwise normal throughout the                            examined colon.                           The terminal ileum appeared normal.                           The retroflexed view of the distal rectum and anal                            verge was normal and showed no anal or rectal                            abnormalities. Complications:            No immediate complications. Estimated Blood Loss:     Estimated blood loss was minimal. Impression:               - One 4 mm polyp at the hepatic flexure, removed                            with a cold snare. Resected and retrieved.                           - Four 3 to 4 mm polyps in the transverse colon,                            removed with a cold snare. Resected and retrieved.                           - A tattoo was seen in the distal transverse colon.                            The tattoo site appeared normal.                           -  Mild diverticulosis in the descending colon.                            There was no evidence of diverticular bleeding.                           - The examined portion of the ileum was normal.                           - The distal rectum and anal  verge are normal on                            retroflexion view. Recommendation:           - Patient has a contact number available for                            emergencies. The signs and symptoms of potential                            delayed complications were discussed with the                            patient. Return to normal activities tomorrow.                            Written discharge instructions were provided to the                            patient.                           - Resume previous diet.                           - Continue present medications.                           - Await pathology results.                           - Repeat colonoscopy (date not yet determined) for                            surveillance based on pathology results.                           - Return to GI office at the next available                            appointment. Joany Khatib E. Stacia, MD 12/30/2023 2:18:10 PM This report has been signed electronically.

## 2023-12-30 NOTE — Progress Notes (Signed)
 Report to PACU, RN, vss, BBS= Clear.

## 2023-12-30 NOTE — Patient Instructions (Addendum)
 Resume previous diet and medications.  Repeat colonoscopy to be based on biopsy results.  Follow up appt scheduled in April - call the office if you need to reschedule.  Take Bentyl  as needed every 6 hours for pain/spasms.  Handout provided on polyps and diverticulosis.    YOU HAD AN ENDOSCOPIC PROCEDURE TODAY AT THE [facility]:   Refer to the procedure report that was given to you for any specific questions about what was found during the examination.  If the procedure report does not answer your questions, please call your gastroenterologist to clarify.  If you requested that your care partner not be given the details of your procedure findings, then the procedure report has been included in a sealed envelope for you to review at your convenience later.  YOU SHOULD EXPECT: Some feelings of bloating in the abdomen. Passage of more gas than usual.  Walking can help get rid of the air that was put into your GI tract during the procedure and reduce the bloating. If you had a lower endoscopy (such as a colonoscopy or flexible sigmoidoscopy) you may notice spotting of blood in your stool or on the toilet paper. If you underwent a bowel prep for your procedure, you may not have a normal bowel movement for a few days.  Please Note:  You might notice some irritation and congestion in your nose or some drainage.  This is from the oxygen used during your procedure.  There is no need for concern and it should clear up in a day or so.  SYMPTOMS TO REPORT IMMEDIATELY:  Following lower endoscopy (colonoscopy or flexible sigmoidoscopy):  Excessive amounts of blood in the stool  Significant tenderness or worsening of abdominal pains  Swelling of the abdomen that is new, acute  Fever of 100F or higher  For urgent or emergent issues, a gastroenterologist can be reached at any hour by calling [phone]. Do not use MyChart messaging for urgent concerns.    DIET:  We do recommend a small meal at  first, but then you may proceed to your regular diet.  Drink plenty of fluids but you should avoid alcoholic beverages for 24 hours.  ACTIVITY:  You should plan to take it easy for the rest of today and you should NOT DRIVE or use heavy machinery until tomorrow (because of the sedation medicines used during the test).    FOLLOW UP: Our staff will call the number listed on your records the next business day following your procedure.  We will call around 7:15- 8:00 am to check on you and address any questions or concerns that you may have regarding the information given to you following your procedure. If we do not reach you, we will leave a message.     If any biopsies were taken you will be contacted by phone or by letter within the next 1-3 weeks.  Please call us  at [phone] if you have not heard about the biopsies in 3 weeks.    SIGNATURES/CONFIDENTIALITY: You and/or your care partner have signed paperwork which will be entered into your electronic medical record.  These signatures attest to the fact that that the information above on your After Visit Summary has been reviewed and is understood.  Full responsibility of the confidentiality of this discharge information lies with you and/or your care-partner.

## 2023-12-30 NOTE — Progress Notes (Signed)
 Pt's states no medical or surgical changes since previsit or office visit.

## 2023-12-30 NOTE — Progress Notes (Signed)
 History and Physical Interval Note:  12/30/2023 1:15 PM  Alan Sanders  has presented today for endoscopic procedure(s), with the diagnosis of  Encounter Diagnosis  Name Primary?   Diarrhea, unspecified type Yes  .  The various methods of evaluation and treatment have been discussed with the patient and/or family. After consideration of risks, benefits and other options for treatment, the patient has consented to  the endoscopic procedure(s).   The patient's history has been reviewed, patient examined, no change in status, stable for endoscopic procedure(s).  I have reviewed the patient's chart and labs.  Questions were answered to the patient's satisfaction.     Dallas Torok E. Stacia, MD [facility]

## 2023-12-31 ENCOUNTER — Encounter: Payer: Commercial Managed Care - PPO | Admitting: Family Medicine

## 2023-12-31 ENCOUNTER — Telehealth: Payer: Self-pay

## 2023-12-31 NOTE — Telephone Encounter (Signed)
Attempted f/u call. No answer, unable to leave VM.

## 2024-01-04 ENCOUNTER — Encounter: Payer: Self-pay | Admitting: Gastroenterology

## 2024-01-04 LAB — SURGICAL PATHOLOGY

## 2024-01-04 NOTE — Progress Notes (Signed)
 Alan Sanders,  All of the small polyps removed were tubular numbness, which are typical precancerous polyps encountered in the colon. I recommend you repeat a colonoscopy in 3 years for ongoing polyp surveillance.

## 2024-01-07 ENCOUNTER — Other Ambulatory Visit: Payer: Self-pay | Admitting: Gastroenterology

## 2024-02-02 ENCOUNTER — Other Ambulatory Visit (HOSPITAL_COMMUNITY): Payer: Self-pay

## 2024-03-01 ENCOUNTER — Encounter: Payer: Self-pay | Admitting: Gastroenterology

## 2024-03-01 ENCOUNTER — Encounter: Payer: Self-pay | Admitting: Student in an Organized Health Care Education/Training Program

## 2024-03-01 ENCOUNTER — Ambulatory Visit: Payer: Commercial Managed Care - PPO | Admitting: Gastroenterology

## 2024-03-01 ENCOUNTER — Other Ambulatory Visit (HOSPITAL_COMMUNITY): Payer: Self-pay

## 2024-03-01 ENCOUNTER — Ambulatory Visit: Admitting: Student in an Organized Health Care Education/Training Program

## 2024-03-01 VITALS — BP 130/78 | HR 104 | Temp 97.9°F | Ht 72.5 in | Wt 214.0 lb

## 2024-03-01 VITALS — BP 124/76 | HR 82

## 2024-03-01 DIAGNOSIS — K589 Irritable bowel syndrome without diarrhea: Secondary | ICD-10-CM | POA: Insufficient documentation

## 2024-03-01 DIAGNOSIS — G959 Disease of spinal cord, unspecified: Secondary | ICD-10-CM | POA: Diagnosis not present

## 2024-03-01 DIAGNOSIS — K580 Irritable bowel syndrome with diarrhea: Secondary | ICD-10-CM

## 2024-03-01 DIAGNOSIS — Z860100 Personal history of colon polyps, unspecified: Secondary | ICD-10-CM | POA: Diagnosis not present

## 2024-03-01 DIAGNOSIS — K2270 Barrett's esophagus without dysplasia: Secondary | ICD-10-CM

## 2024-03-01 DIAGNOSIS — K760 Fatty (change of) liver, not elsewhere classified: Secondary | ICD-10-CM

## 2024-03-01 DIAGNOSIS — F339 Major depressive disorder, recurrent, unspecified: Secondary | ICD-10-CM | POA: Insufficient documentation

## 2024-03-01 MED ORDER — METHOCARBAMOL 750 MG PO TABS: 750.0000 mg | ORAL_TABLET | Freq: Every day | ORAL | 1 refills | Status: DC | PRN

## 2024-03-01 MED ORDER — PANTOPRAZOLE SODIUM 40 MG PO TBEC
40.0000 mg | DELAYED_RELEASE_TABLET | Freq: Every day | ORAL | 3 refills | Status: DC
  Filled 2024-03-01: qty 30, 30d supply, fill #0

## 2024-03-01 MED ORDER — AMITRIPTYLINE HCL 10 MG PO TABS
ORAL_TABLET | ORAL | 3 refills | Status: DC
  Filled 2024-03-01: qty 90, fill #0

## 2024-03-01 MED ORDER — DULOXETINE HCL 30 MG PO CPEP: 30.0000 mg | ORAL_CAPSULE | Freq: Every day | ORAL | 3 refills | Status: DC

## 2024-03-01 MED ORDER — PANTOPRAZOLE SODIUM 40 MG PO TBEC
40.0000 mg | DELAYED_RELEASE_TABLET | Freq: Every day | ORAL | 3 refills | Status: DC
Start: 2024-03-01 — End: 2024-09-13

## 2024-03-01 MED ORDER — GABAPENTIN 300 MG PO CAPS: 300.0000 mg | ORAL_CAPSULE | Freq: Three times a day (TID) | ORAL | 2 refills | Status: DC

## 2024-03-01 MED ORDER — AMITRIPTYLINE HCL 10 MG PO TABS: ORAL_TABLET | ORAL | 3 refills | Status: DC

## 2024-03-01 NOTE — Patient Instructions (Addendum)
 We have sent the following medications to your pharmacy for you to pick up at your convenience: Elavil 10 mg  Protonix refills  Use Imodium 2 mg OTC by mouth every 6 hours as needed for diarrhea   _______________________________________________________  If your blood pressure at your visit was 140/90 or greater, please contact your primary care physician to follow up on this.  _______________________________________________________  If you are age 53 or older, your body mass index should be between 23-30. Your There is no height or weight on file to calculate BMI. If this is out of the aforementioned range listed, please consider follow up with your Primary Care Provider.  If you are age 53 or younger, your body mass index should be between 19-25. Your There is no height or weight on file to calculate BMI. If this is out of the aformentioned range listed, please consider follow up with your Primary Care Provider.   ________________________________________________________  The [place] [facility] providers would like to encourage you to use [facility] to communicate with providers for non-urgent requests or questions.  Due to long hold times on the telephone, sending your provider a message by [facility] may be a faster and more efficient way to get a response.  Please allow 48 business hours for a response.  Please remember that this is for non-urgent requests.  _______________________________________________________   I appreciate the  opportunity to care for you  Thank You   Scott Cunningham,MD

## 2024-03-01 NOTE — Assessment & Plan Note (Signed)
 Chronic issue.  Managed with Dr. Tomasa Rand.  Recently started on amitriptyline in addition to dicyclomine and pantoprazole.

## 2024-03-01 NOTE — Progress Notes (Unsigned)
 Discussed the use of AI scribe software for clinical note transcription with the patient, who gave verbal consent to proceed.  HPI : Alan Sanders is a 53 year old male with a history of anxiety, depression, nondysplastic Barrett's and numerous colon polyps who presents follow-up of chronic abdominal pain and diarrhea.  He was last seen in our office by Boone Master on January 7.  At that visit a GI pathogen panel was obtained which was negative, as well as normal fecal elastase and TSH.  He was scheduled for a surveillance colonoscopy (previous colonoscopy in April 2024 had inadequate prep) which was notable for 5 small tubular adenomas.  He had previously had random colon biopsies that were negative for microscopic colitis.  He was prescribed Bentyl to help with his pain. He was noted to have persistently mildly elevated liver enzymes with evidence of steatosis on imaging.  A workup was ordered to evaluate for chronic liver disease, but these labs were not submitted.  He has experienced chronic abdominal pain and diarrhea for several years now. The abdominal pain is constant and not significantly affected by eating, remaining at a consistent level. Diarrhea is accompanied by a sensation of urgency, often resulting in small bowel movements. He has three to four bowel movements daily, sometimes more, and denies constipation or hard stools.  He has been taking Bentyl, but it has not alleviated his pain.  He was advised to take up to four doses when in significant pain, but this has not been effective. He has also tried Pepto-Bismol for diarrhea but has not used Imodium.   He has undergone extensive workup including multiple CT scans, colonoscopies, blood tests, and stool tests, all of which have returned normal results.  He is found to have short segment Barrett's esophagus on EGD in April 2024 (C0M2) and is supposed to be on pantoprazole, though he has not been taking it regularly.  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  He notes stress due to personal issues (is going through a separation with his wife of 13 years), which he acknowledges may be affecting his gastrointestinal symptoms.  Lexapro was recently increased to 25 mg for his depression..     Previous Endoscopies  Colonoscopy  Feb 2025 - One 4 mm polyp at the hepatic flexure, removed with a cold snare. Resected and retrieved.  - Four 3 to 4 mm polyps in the transverse colon, removed with a cold snare. Resected and retrieved.  - A tattoo was seen in the distal transverse colon. The tattoo site appeared normal.  - Mild diverticulosis in the descending colon. There was no evidence of diverticular bleeding. - The examined portion of the ileum was normal.  - The distal rectum and anal verge are normal on retroflexion view.  FINAL DIAGNOSIS        1. Surgical [P], colon, hepatic flexure, transverse, polyp (5) :       -  TUBULAR ADENOMA, FRAGMENTS.   Colonoscopy 02/23/2023 - Preparation of the colon was inadequate.  - One 5 mm polyp in the ascending colon, removed with a cold snare. Resected and retrieved.  - One 10 mm polyp in the ascending colon, removed with a cold snare. Resected and retrieved.  - Two 4 to 5 mm polyps at the splenic flexure, removed with a cold snare. Resected and retrieved.  - A tattoo was seen in the distal transverse colon. The tattoo  site appeared normal.  - Normal mucosa in the entire examined colon. Biopsied.  - The examined portion of the ileum was normal.  - The distal rectum and anal verge are normal on retroflexion view.  - The GI Genius ( intelligent endoscopy module) , computer- aided polyp detection system powered by AI was utilized to detect colorectal polyps through enhanced visualization during colonoscopy - repeat < 1 year   EGD 02/23/2023 for diarrhea, no, vomiting, weight loss - The examined portions of the nasopharynx, oropharynx and  larynx were normal.  - Esophageal mucosal changes suspicious for short- segment Barrett' s esophagus, classified as Barrett' s stage C0- M2 per [place] criteria. Biopsied.  - Normal stomach. Biopsied.  - Normal examined duodenum. Biopsied.   Diagnosis 1. Surgical [P], duodenal - BENIGN SMALL BOWEL MUCOSA WITH NO SIGNIFICANT PATHOLOGIC CHANGES 2. Surgical [P], gastric - GASTRIC ANTRAL AND OXYNTIC MUCOSA WITH MILD REACTIVE/REPARATIVE CHANGES - NEGATIVE FOR H. PYLORI ON H&E STAIN - NEGATIVE FOR INTESTINAL METAPLASIA OR MALIGNANCY 3. Surgical [P], esophagus at 40 cm - BARRETT MUCOSA, NEGATIVE FOR DYSPLASIA 4. Surgical [P], esophagus at 38 cm - BARRETT MUCOSA, NEGATIVE FOR DYSPLASIA 5. Surgical [P], colon, splenic flexure and ascending, polyp (4) - TUBULAR ADENOMA(S). - NO HIGH GRADE DYSPLASIA OR MALIGNANCY. 6. Surgical [P], random colon sites - BENIGN COLONIC MUCOSA WITH NO SPECIFIC PATHOLOGIC CHANGES - NEGATIVE FOR INCREASED INTRAEPITHELIAL LYMPHOCYTES OR THICKENED SUBEPITHELIAL COLLAGEN TABLE - NEGATIVE FOR DYSPLASIA OR MALIGNANCY     Colonoscopy 03/2022: - Ten 4 to 8 mm polyps at the recto-sigmoid colon, in the descending colon, at the splenic flexure, in the transverse colon and at the hepatic flexure, removed with a cold snare. Resected and retrieved.  - One 20 mm polyp in the distal transverse colon, removed using injection-lift and a hot snare. Resected and retrieved. Clips (MR conditional) were placed. Tattooed.  - One 15 mm polyp in the sigmoid colon, removed with a hot snare. Resected and retrieved. Biopsied.  - One 8 mm polyp in the sigmoid colon, removed with a cold snare. Resected and retrieved.  - The examined portion of the ileum was normal.  - The distal rectum and anal verge are normal on retroflexion view.   1. Surgical [P], colon, recto-sigmoid, descending, splenic flexure, transverse, and hepatic flexure, polyp (10) - TUBULAR ADENOMAS, MULTIPLE ADENOMATOUS FRAGMENTS,  NEGATIVE FOR HIGH-GRADE DYSPLASIA. 2. Surgical [P], colon, transverse, polyp (1) - TUBULAR ADENOMA, NEGATIVE FOR HIGH-GRADE DYSPLASIA. 3. Surgical [P], colon, sigmoid, polyp (1) - TUBULAR ADENOMA, NEGATIVE FOR HIGH-GRADE DYSPLASIA. 4. Surgical [P], colon, sigmoid, polyp (1) - TUBULOVILLOUS ADENOMA, NEGATIVE FOR HIGH-GRADE DYSPLASIA, PEDUNCULATED (STALK MARGIN NEGATIVE FOR ADENOMATOUS CHANGE). 5. Surgical [P], colon, sigmoid, polyp (1) SUGGESTIVE OF GOBLET CELL-RICH HYPERPLASTIC POLYP.   Repeat recommended in 6 months.   Previous GI imaging  CT abdomen/pelvis December 22, 2023  IMPRESSION: 1. No acute findings. 2. Mild prostate enlargement   CT abdomen/pelvis December 11, 2023  IMPRESSION: 1. No acute inflammatory process identified within the abdomen or pelvis. 2. No bowel obstruction. 3. Multiple other nonacute observations, as described above   Plain film abdomen December 09, 2023   IMPRESSION: There are several loops of small bowel within the RIGHT hemiabdomen which measures the upper limits of normal in diameter with subjectively prominent intervening bowel walls. Findings could reflect a nonspecific enteritis.   If there is a clinical concern for bowel obstruction, dedicated CT abdomen pelvis with contrast would be recommended.   CT abdomen/pelvis November 03, 2023  IMPRESSION: No acute abnormality noted.   CT abdomen/pelvis February 11, 2023  IMPRESSION: Mild splenomegaly.   No definite acute abnormality seen in the abdomen or pelvis.    Right upper quadrant ultrasound January 30, 2023  IMPRESSION: 1. Diffuse increased echogenicity throughout the liver is nonspecific and may be seen with hepatic steatosis or other underlying intrinsic liver disease. 2. No other abnormalities   CT abdomen/pelvis January 03, 2023   IMPRESSION: 1. No acute intra-abdominal or pelvic pathology. No bowel obstruction. Normal appendix. 2. Fatty liver. 3.  Splenomegaly.   CT abdomen/pelvis December 30, 2022  IMPRESSION: 1. Dilated small bowel loops within the mid left abdomen which may be transient in nature. Sequelae associated with a partial small bowel obstruction cannot be excluded. 2. Mild right middle lobe and posterior bibasilar atelectasis   CT abdomen/pelvis August 25, 2022  IMPRESSION: 1. Sigmoid diverticulosis. 2. Small fat containing left inguinal hernia  Past Medical History:  Diagnosis Date   Anxiety    situational anxiety-on meds   Arthritis    neck/back/LEFT wrist   Asthma    PRN inhaler   Balance problem    Depression    on meds   Family history of colonic polyps 05/08/2022   Gait difficulty    GERD (gastroesophageal reflux disease)    OTC PRN meds   Headache    migraines   History of colonic polyps 05/08/2022   Inguinal hernia of left side without obstruction or gangrene    Pneumonia    Seasonal allergies    Seizures (HCC)    " its been along time ,since my last seizure "   Spleen enlarged    Testosterone deficiency in male 03/26/2023   Thrombocytopenia (HCC)    Tobacco abuse      Past Surgical History:  Procedure Laterality Date   ANTERIOR CERVICAL DECOMP/DISCECTOMY FUSION N/A 04/16/2021   Procedure: Cervical three-four  Anterior cervical decompression/discectomy/fusion;  Surgeon: Bethann Goo, DO;  Location: [facility] OR;  Service: Neurosurgery;  Laterality: N/A;   COLONOSCOPY  03/2022   [place]-[facility]-suprep(adeq)-many TA's-1 piecemeal   INGUINAL HERNIA REPAIR Right 2006   POLYPECTOMY  03/2022   many TA's-1 piecemeal   SURAL NERVE BX Right 12/11/2020   Procedure: SURAL NERVE BIOPSY;  Surgeon: Bethann Goo, DO;  Location: [facility] OR;  Service: Neurosurgery;  Laterality: Right;   TIBIA IM NAIL INSERTION Right 03/23/2023   Procedure: INTRAMEDULLARY (IM) NAIL TIBIAL;  Surgeon: Myrene Galas, MD;  Location: [facility] OR;  Service: Orthopedics;  Laterality: Right;   Family History  Problem Relation Age of Onset    Diabetes Mother    Hypertension Mother    COPD Mother    Colon polyps Father        unknown number   Alcoholism Brother    Colon cancer Neg Hx    Esophageal cancer Neg Hx    Stomach cancer Neg Hx    Rectal cancer Neg Hx    Social History   Tobacco Use   Smoking status: Every Day    Current packs/day: 0.50    Average packs/day: 0.5 packs/day for 24.0 years (12.0 ttl pk-yrs)    Types: Cigarettes   Smokeless tobacco: Current    Types: Snuff   Tobacco comments:    Chewed snuff last night  Vaping Use   Vaping status: Never Used  Substance Use Topics   Alcohol use: No   Drug use: No   Current Outpatient Medications  Medication Sig Dispense Refill   albuterol (  VENTOLIN HFA) 108 (90 Base) MCG/ACT inhaler Inhale 1-2 puffs into the lungs every 6 (six) hours as needed for wheezing or shortness of breath. (Patient taking differently: Inhale 2-3 puffs into the lungs as needed for wheezing or shortness of breath.) 6.7 g 2   ascorbic acid (VITAMIN C) 1000 MG tablet Take 1 tablet (1,000 mg total) by mouth daily. 30 tablet 1   Cholecalciferol 125 MCG (5000 UT) TABS Take 1 tablet (5,000 Units total) by mouth daily. 30 tablet 6   dicyclomine (BENTYL) 20 MG tablet TAKE 1 TABLET (20 MG TOTAL) BY MOUTH EVERY 6 (SIX) HOURS AS NEEDED FOR SPASMS. 360 tablet 1   escitalopram (LEXAPRO) 10 MG tablet Take 2 tablets (20 mg total) by mouth daily. 60 tablet 1   gabapentin (NEURONTIN) 300 MG capsule Take 1 capsule (300 mg total) by mouth 3 (three) times daily. 90 capsule 2   ondansetron (ZOFRAN-ODT) 4 MG disintegrating tablet Take 1 tablet (4 mg total) by mouth every 8 (eight) hours as needed. 20 tablet 0   tamsulosin (FLOMAX) 0.4 MG CAPS capsule Take 1 capsule (0.4 mg total) by mouth daily after breakfast. 10 capsule 0   Vitamin D, Ergocalciferol, (DRISDOL) 1.25 MG (50000 UNIT) CAPS capsule Take 1 capsule (50,000 Units total) by mouth every 7 (seven) days. 5 capsule 1   pantoprazole (PROTONIX) 40 MG tablet  Take 1 tablet (40 mg total) by mouth daily. 30 tablet 0   No current facility-administered medications for this visit.   Allergies  Allergen Reactions   Hydromorphone Itching and Rash     Review of Systems: All systems reviewed and negative except where noted in HPI.    No results found.  Physical Exam: BP 124/76   Pulse 82  Constitutional: Pleasant,well-developed, Caucasian male in no acute distress. HEENT: Normocephalic and atraumatic. Conjunctivae are normal. No scleral icterus. Neurological: Alert and oriented to person place and time. Skin: Skin is warm and dry. No rashes noted. Psychiatric: Normal mood and affect. Behavior is normal.  CBC    Component Value Date/Time   WBC 9.1 12/11/2023 1146   RBC 5.15 12/11/2023 1146   HGB 15.2 12/11/2023 1146   HGB 16.6 09/10/2011 0825   HCT 42.3 12/11/2023 1146   HCT 47.0 09/10/2011 0825   PLT 108 (L) 12/11/2023 1146   PLT 110 (L) 09/10/2011 0825   MCV 82.1 12/11/2023 1146   MCV 84.1 09/10/2011 0825   MCH 29.5 12/11/2023 1146   MCHC 35.9 12/11/2023 1146   RDW 13.7 12/11/2023 1146   RDW 13.4 09/10/2011 0825   LYMPHSABS 2.1 12/01/2023 1019   LYMPHSABS 0.9 09/10/2011 0825   MONOABS 0.5 12/01/2023 1019   MONOABS 0.3 09/10/2011 0825   EOSABS 0.3 12/01/2023 1019   EOSABS 0.1 09/10/2011 0825   BASOSABS 0.1 12/01/2023 1019   BASOSABS 0.0 09/10/2011 0825    CMP     Component Value Date/Time   NA 136 12/11/2023 1146   K 3.4 (L) 12/11/2023 1146   CL 101 12/11/2023 1146   CO2 25 12/11/2023 1146   GLUCOSE 127 (H) 12/11/2023 1146   BUN 15 12/11/2023 1146   CREATININE 0.66 12/11/2023 1146   CALCIUM 9.6 12/11/2023 1146   PROT 7.4 12/11/2023 1146   PROT 7.6 01/12/2023 1414   ALBUMIN 4.8 12/11/2023 1146   AST 33 12/11/2023 1146   ALT 68 (H) 12/11/2023 1146   ALKPHOS 70 12/11/2023 1146   BILITOT 0.7 12/11/2023 1146   GFRNONAA >60 12/11/2023 1146   GFRAA >  60 09/14/2019 0408       Latest Ref Rng & Units 12/11/2023    11:46 AM 12/01/2023   10:19 AM 11/26/2023   12:54 AM  CBC EXTENDED  WBC 4.0 - 10.5 K/uL 9.1  9.1    RBC 4.22 - 5.81 MIL/uL 5.15  5.42    Hemoglobin 13.0 - 17.0 g/dL 16.1  09.6  04.5   HCT 39.0 - 52.0 % 42.3  46.2  44.0   Platelets 150 - 400 K/uL 108  136.0    NEUT# 1.4 - 7.7 K/uL  6.1    Lymph# 0.7 - 4.0 K/uL  2.1        ASSESSMENT AND PLAN:  53 year old male with chronic abdominal pain and diarrhea with numerous unremarkable CT scans of the abdomen and pelvis, colonoscopy negative for inflammatory bowel disease and microscopic colitis, negative infectious workup, normal fecal elastase, negative celiac serologies.  No red flag symptoms.  History is consistent with diarrhea predominant IBS.  Diarrhea-predominant Irritable Bowel Syndrome (IBS) Chronic abdominal pain and diarrhea for several years with no significant change in severity. Multiple negative diagnostic tests including CT scans, colonoscopies, blood tests, and stool tests. Symptoms include frequent bowel movements (3-4 times daily), urgency, and incomplete evacuation. Bentyl has been ineffective for pain management.  We discussed the proposed pathophysiology of IBS and gut brain axis disorders in general.  We discussed management of IBS, to include use of medications to improve bowel habits, as needed pain medicine, centrally acting neuromodulators, role of empiric dietary modifications to include a low FODMAP diet gluten-free diet, as well as the role of cognitive therapies.  We discussed the goals of IBS management, namely to minimize the impact of GI symptoms on quality of life.  I emphasized that it would be unlikely for the patient to achieve completely normal bowel habits and no abdominal discomfort, but we will hopefully see some improvement in his symptoms to improve his quality of life. I recommended he start taking Elavil, and he was agreeable.  I told him it would likely take at least 4-6 weeks to see improvement in his  symptoms, with potential side effects including constipation, drowsiness, dry mouth, and feeling "different". Imodium is suggested for situational use to manage diarrhea. - Start Elavil 10 mg before bed for 2 weeks, then increase to 20 mg. - Use Imodium as needed for diarrhea, especially in situations where bathroom access is limited. - Continue Bentyl as needed - Follow up in 2-3 months to assess response to treatment.  Barrett's Esophagus Barrett's Esophagus, a condition where the lining of the esophagus changes, increasing the risk of esophageal cancer. It is important to manage acid reflux to prevent progression. Pantoprazole is prescribed to reduce acid production. - Refill pantoprazole (Protonix) 40 mg daily. - Schedule repeat upper endoscopy in 2027.  Colon Polyps Colon polyps with previous colonoscopy showing no current issues. Surveillance colonoscopy is scheduled every 3 years. - Schedule next colonoscopy in 2028.   Fatty liver Suspect MASLD.  We did not have time to discuss his liver issues at this visit.  We will discuss this more at his follow-up visit in 2-3 months.    Tiarrah Saville E. Tomasa Rand, MD [facility]    Doreene Burke [employer],*

## 2024-03-01 NOTE — Assessment & Plan Note (Signed)
 Chronic intermittent issue.  No radiculopathy at this point.  Not function limiting.  No red flags.  Has some benefit from using gabapentin and Robaxin.  We talked about precautions with those medications.  I recommended only as needed use of the muscle relaxer.

## 2024-03-01 NOTE — Assessment & Plan Note (Signed)
 Chronic depression, not well treated right now.  He has moderate to severe symptom burden.  PHQ-9 score only of 11 today, seems like an underestimate.  I think his mood is worsened by acute adjustment disorder to recent divorce.  We talked about the natural course of this kind of grief.  He did not find benefit to using Lexapro over the last few years and stopped about 6 months ago.  No high risk features to suggest bipolar disease or psychosis.  No comorbid substance use.  He does seem to have comorbid hypersomnia as well as chronic pain syndromes with neuropathy in his feet and chronic neck pain.  Also has a history of seizures as a child so need to avoid bupropion.  Will start Cymbalta 30 mg daily.  I think this will be safe and hopefully treat the comorbid chronic pain issue.  Additionally he has IBS and was recently started on amitriptyline by GI.  Need to be careful about combining this medicine with Cymbalta.  Will follow-up the patient in 4 weeks to see if we can titrate up the Cymbalta.

## 2024-03-01 NOTE — Progress Notes (Signed)
 New Patient Office Visit  Subjective    Patient ID: Alan Sanders, male    DOB: [birth year]  Age: 53 y.o. MRN: [id number]  CC:   Chief Complaint  Patient presents with   Establish Care    Patient states recent divorce and having a hard time.  Would like to discuss a couple of options that could help mental health wise.    HPI  Alan Sanders presents to establish care  53 year old person comes in today to establish care and for management of depression.  He reports about a 6-10-year history of depression that has been significant for him.  He reports having depressed mood, anhedonia, and hypersomnia.  Symptoms have been worsened recently as he is going through a divorce.  He was previously living in [place] with his wife.  He moved to [place] to stay with his father who had a hip fracture and is needing more help.  He is planning on staying in [place] permanently to help his father, which is why he came to establish care with [place].  Works as a Electrical engineer at [employer], working third shift.  This has caused some send disturbance.  He reports sleeping most of the day when he is not working.  He does not exercise, appetite has not been good, spends most of his free time watching TV, likes to watch sports.  Denies any suicidal ideation.  No recent illness.  He has been somewhat inconsistent with taking medications, if he feels like they are not helping him the will stop taking them for a while.  No fevers or chills, no chest pain pressure, or dyspnea with exertion.  Denies any auditory or visual hallucinations.  No racing thoughts or insomnia.   Outpatient Encounter Medications as of 03/01/2024  Medication Sig   albuterol (VENTOLIN HFA) 108 (90 Base) MCG/ACT inhaler Inhale 1-2 puffs into the lungs every 6 (six) hours as needed for wheezing or shortness of breath. (Patient taking differently: Inhale 2-3 puffs into the lungs as needed for wheezing or shortness of breath.)    amitriptyline (ELAVIL) 10 MG tablet Take Elavil 10 mg by mouth before bedtime for 2 weeks then increase to 2 tablets (20 mg)  by mouth at bedtime   ascorbic acid (VITAMIN C) 1000 MG tablet Take 1 tablet (1,000 mg total) by mouth daily.   dicyclomine (BENTYL) 20 MG tablet TAKE 1 TABLET (20 MG TOTAL) BY MOUTH EVERY 6 (SIX) HOURS AS NEEDED FOR SPASMS.   DULoxetine (CYMBALTA) 30 MG capsule Take 1 capsule (30 mg total) by mouth daily.   methocarbamol (ROBAXIN) 750 MG tablet Take 1 tablet (750 mg total) by mouth daily as needed for muscle spasms.   ondansetron (ZOFRAN-ODT) 4 MG disintegrating tablet Take 1 tablet (4 mg total) by mouth every 8 (eight) hours as needed.   pantoprazole (PROTONIX) 40 MG tablet Take 1 tablet (40 mg total) by mouth daily.   tamsulosin (FLOMAX) 0.4 MG CAPS capsule Take 1 capsule (0.4 mg total) by mouth daily after breakfast.   [DISCONTINUED] Cholecalciferol 125 MCG (5000 UT) TABS Take 1 tablet (5,000 Units total) by mouth daily.   [DISCONTINUED] escitalopram (LEXAPRO) 10 MG tablet Take 2 tablets (20 mg total) by mouth daily.   [DISCONTINUED] gabapentin (NEURONTIN) 300 MG capsule Take 1 capsule (300 mg total) by mouth 3 (three) times daily.   gabapentin (NEURONTIN) 300 MG capsule Take 1 capsule (300 mg total) by mouth 3 (three) times daily.   [DISCONTINUED] Vitamin  D, Ergocalciferol, (DRISDOL) 1.25 MG (50000 UNIT) CAPS capsule Take 1 capsule (50,000 Units total) by mouth every 7 (seven) days. (Patient not taking: Reported on 03/01/2024)   No facility-administered encounter medications on file as of 03/01/2024.    Past Medical History:  Diagnosis Date   Anxiety    situational anxiety-on meds   Arthritis    neck/back/LEFT wrist   Asthma    PRN inhaler   Balance problem    Depression    on meds   Family history of colonic polyps 05/08/2022   Gait difficulty    GERD (gastroesophageal reflux disease)    OTC PRN meds   Headache    migraines   History of colonic polyps  05/08/2022   Inguinal hernia of left side without obstruction or gangrene    Peyronie's disease 02/27/2022   Pneumonia    Seasonal allergies    Seizures (HCC)    " its been along time ,since my last seizure "   Spleen enlarged    Testosterone deficiency in male 03/26/2023   Thrombocytopenia (HCC)    Tobacco abuse     Past Surgical History:  Procedure Laterality Date   ANTERIOR CERVICAL DECOMP/DISCECTOMY FUSION N/A 04/16/2021   Procedure: Cervical three-four  Anterior cervical decompression/discectomy/fusion;  Surgeon: Bethann Goo, DO;  Location: [facility] OR;  Service: Neurosurgery;  Laterality: N/A;   COLONOSCOPY  03/2022   [place]-MAC-suprep(adeq)-many TA's-1 piecemeal   INGUINAL HERNIA REPAIR Right 2006   POLYPECTOMY  03/2022   many TA's-1 piecemeal   SURAL NERVE BX Right 12/11/2020   Procedure: SURAL NERVE BIOPSY;  Surgeon: Bethann Goo, DO;  Location: [facility] OR;  Service: Neurosurgery;  Laterality: Right;   TIBIA IM NAIL INSERTION Right 03/23/2023   Procedure: INTRAMEDULLARY (IM) NAIL TIBIAL;  Surgeon: Myrene Galas, MD;  Location: [facility] OR;  Service: Orthopedics;  Laterality: Right;    Family History  Problem Relation Age of Onset   Diabetes Mother    Hypertension Mother    COPD Mother    Colon polyps Father        unknown number   Alcoholism Brother    Colon cancer Neg Hx    Esophageal cancer Neg Hx    Stomach cancer Neg Hx    Rectal cancer Neg Hx     Social History   Socioeconomic History   Marital status: Married    Spouse name: Not on file   Number of children: 2   Years of education: Not on file   Highest education level: High school graduate  Occupational History   Not on file  Tobacco Use   Smoking status: Every Day    Current packs/day: 0.50    Average packs/day: 0.5 packs/day for 24.0 years (12.0 ttl pk-yrs)    Types: Cigarettes   Smokeless tobacco: Current    Types: Snuff   Tobacco comments:    Chewed snuff last night  Vaping Use   Vaping status: Never  Used  Substance and Sexual Activity   Alcohol use: No   Drug use: No   Sexual activity: Not on file  Other Topics Concern   Not on file  Social History Narrative   Lives with spouse   Sweet tea, energy drinks, Diet Coke, work 12 hr shifts   Social Drivers of Corporate investment banker Strain: Not on file  Food Insecurity: No Food Insecurity (03/22/2023)   Hunger Vital Sign    Worried About Running Out of Food in the Last Year: Never  true    Ran Out of Food in the Last Year: Never true  Transportation Needs: No Transportation Needs (03/22/2023)   PRAPARE - Administrator, Civil Service (Medical): No    Lack of Transportation (Non-Medical): No  Physical Activity: Not on file  Stress: Not on file  Social Connections: Not on file  Intimate Partner Violence: Not At Risk (02/12/2023)   Humiliation, Afraid, Rape, and Kick questionnaire    Fear of Current or Ex-Partner: No    Emotionally Abused: No    Physically Abused: No    Sexually Abused: No        Objective    BP 130/78   Pulse (!) 104   Temp 97.9 F (36.6 C) (Temporal)   Ht 6' 0.5" (1.842 m)   Wt 214 lb (97.1 kg)   SpO2 97%   BMI 28.62 kg/m   Physical Exam  Gen: Well-appearing man Eyes: Normal Neck: Normal thyroid, no adenopathy or nodules Heart: Regular, no murmur Lungs: Unlabored, clear throughout Ext: Warm, no edema Neuro: Alert, conversational, full strength upper and lower extremities, mildly delayed get up and go, normal gait Psych: Mildly depressed affect, not anxious appearing, linear thoughts, normal speech, well put together      Assessment & Plan:   Problem List Items Addressed This Visit       Unprioritized   Cervical myelopathy (HCC)   Chronic intermittent issue.  No radiculopathy at this point.  Not function limiting.  No red flags.  Has some benefit from using gabapentin and Robaxin.  We talked about precautions with those medications.  I recommended only as needed use of the  muscle relaxer.      Relevant Medications   DULoxetine (CYMBALTA) 30 MG capsule   methocarbamol (ROBAXIN) 750 MG tablet   gabapentin (NEURONTIN) 300 MG capsule   Depression, recurrent (HCC) - Primary   Chronic depression, not well treated right now.  He has moderate to severe symptom burden.  PHQ-9 score only of 11 today, seems like an underestimate.  I think his mood is worsened by acute adjustment disorder to recent divorce.  We talked about the natural course of this kind of grief.  He did not find benefit to using Lexapro over the last few years and stopped about 6 months ago.  No high risk features to suggest bipolar disease or psychosis.  No comorbid substance use.  He does seem to have comorbid hypersomnia as well as chronic pain syndromes with neuropathy in his feet and chronic neck pain.  Also has a history of seizures as a child so need to avoid bupropion.  Will start Cymbalta 30 mg daily.  I think this will be safe and hopefully treat the comorbid chronic pain issue.  Additionally he has IBS and was recently started on amitriptyline by GI.  Need to be careful about combining this medicine with Cymbalta.  Will follow-up the patient in 4 weeks to see if we can titrate up the Cymbalta.      Relevant Medications   DULoxetine (CYMBALTA) 30 MG capsule   IBS (irritable bowel syndrome)   Chronic issue.  Managed with Dr. Tomasa Rand.  Recently started on amitriptyline in addition to dicyclomine and pantoprazole.       Return in about 4 weeks (around 03/29/2024).   Tyson Alias, MD

## 2024-03-18 ENCOUNTER — Encounter: Payer: Self-pay | Admitting: Student in an Organized Health Care Education/Training Program

## 2024-03-18 ENCOUNTER — Ambulatory Visit: Payer: Self-pay

## 2024-03-18 ENCOUNTER — Ambulatory Visit: Admitting: Student in an Organized Health Care Education/Training Program

## 2024-03-18 VITALS — BP 120/90 | HR 85 | Temp 98.2°F | Wt 216.0 lb

## 2024-03-18 DIAGNOSIS — F339 Major depressive disorder, recurrent, unspecified: Secondary | ICD-10-CM

## 2024-03-18 DIAGNOSIS — B079 Viral wart, unspecified: Secondary | ICD-10-CM

## 2024-03-18 DIAGNOSIS — J301 Allergic rhinitis due to pollen: Secondary | ICD-10-CM

## 2024-03-18 MED ORDER — FLUTICASONE PROPIONATE 50 MCG/ACT NA SUSP: 2.0000 | Freq: Every day | NASAL | 2 refills | Status: DC

## 2024-03-18 MED ORDER — CETIRIZINE HCL 10 MG PO TABS: 10.0000 mg | ORAL_TABLET | Freq: Every day | ORAL | 2 refills | Status: DC

## 2024-03-18 NOTE — Assessment & Plan Note (Signed)
 Allergies have been a acute on chronic issue over the last few weeks.  We are going to increase treatment to cetirizine 10 mg daily and then add Flonase  intranasal 2 times daily.

## 2024-03-18 NOTE — Assessment & Plan Note (Addendum)
 Exam consistent with a verruca on his posterior right leg, behind the knee.  We talked about treatment options and decided to treat this with cryotherapy.  Procedure Note:   Liquid nitrogen was applied for 10-12 seconds to the skin lesions and the expected blistering or scabbing reaction explained. Do not pick at the areas. Patient reminded to expect hypopigmented scars from the procedure. Return if lesions fail to fully resolve.

## 2024-03-18 NOTE — Progress Notes (Signed)
   Established Patient Office Visit  Subjective   Patient ID: Alan Sanders, male    DOB: [birth year]  Age: 53 y.o. MRN: [id number]  Chief Complaint  Patient presents with   Mass     From nurse triage note today:     Painful bump. Patient started noticing a lump forming on the back of his right leg that is painful to touch and might be possibly infected as it is red. Patient is unaware of how this happened how where it came from. Showed up a couple weeks ago. Cyst on left upper arm.      HPI  53 year old person here for follow-up of depression and with an acute concern of a painful growth on the back of his right knee.  Doing much better since I last saw him 3 weeks ago.  He started Cymbalta  and is tolerating that well.  Reports improvement in his mood.  Doing well at work.  Going through divorce so it is a difficult time for him.  Staying active.  He reports the medications are helpful, denies any adverse side effects.    Objective:     BP (!) 120/90   Pulse 85   Temp 98.2 F (36.8 C) (Temporal)   Wt 216 lb (98 kg)   SpO2 98%   BMI 28.89 kg/m    Physical Exam  Gen: Well-appearing man, no distress Skin: On the posterior right knee there is a 3 x 5 mm verrucous growth without underlying erythema.  Too firm to be a skin tag.  No underlying nodule.  No other rashes. Psych: Appropriate mood and affect, pleasant to talk with, not anxious or depressed appearing    Assessment & Plan:   Problem List Items Addressed This Visit       Unprioritized   Seasonal allergic rhinitis due to pollen   Allergies have been a acute on chronic issue over the last few weeks.  We are going to increase treatment to cetirizine 10 mg daily and then add Flonase  intranasal 2 times daily.      Relevant Medications   cetirizine (ZYRTEC) 10 MG tablet   fluticasone  (FLONASE ) 50 MCG/ACT nasal spray   Depression, recurrent (HCC)   Better since starting Cymbalta  30 mg daily.  No adverse side effects.   He reports good benefit from the medication.  Will plan to continue this medicine.  No interaction so far with the amitriptyline .  No issues with somnolence.  Doing well at work.      Verruca - Primary   Exam consistent with a verruca on his posterior right leg, behind the knee.  We talked about treatment options and decided to treat this with cryotherapy.  Procedure Note:   Liquid nitrogen was applied for 10-12 seconds to the skin lesions and the expected blistering or scabbing reaction explained. Do not pick at the areas. Patient reminded to expect hypopigmented scars from the procedure. Return if lesions fail to fully resolve.        Return in about 3 months (around 06/17/2024).    Ether Hercules, MD

## 2024-03-18 NOTE — Assessment & Plan Note (Signed)
 Better since starting Cymbalta  30 mg daily.  No adverse side effects.  He reports good benefit from the medication.  Will plan to continue this medicine.  No interaction so far with the amitriptyline .  No issues with somnolence.  Doing well at work.

## 2024-03-18 NOTE — Telephone Encounter (Signed)
 Copied from CRM [id number]. Topic: Clinical - Red Word Triage >> Mar 18, 2024  1:47 PM [place] E wrote: [facility] that prompted transfer to Nurse Triage: Painful bump. Patient started noticing a lump forming on the back of his right leg that is painful to touch and might be possibly infected as it is red. Patient is unaware of how this happened how where it came from  Chief Complaint: growth Symptoms: growth found on back of right leg around knee area on backside, red, painful Frequency: x 2 - 3 weeks Pertinent Negatives: Patient denies fever Disposition: [] ED /[] Urgent Care (no appt availability in office) / [x] Appointment(In office/virtual)/ []  [facility]/ [] Home Care/ [] Refused Recommended Disposition /[] [facility]/ []  Follow-up with PCP Additional Notes: pt stated the area in getting larger and more swollen: in office appt made with PCP today  Reason for Disposition  Caller can't describe it clearly  Answer Assessment - Initial Assessment Questions 1. APPEARANCE of LESION: "What does it look like?"      red 2. SIZE: "How big is it?" (e.g., inches, cm; or compare to size of pinhead, tip of pen, eraser, coin, pea, grape, ping pong ball)      I little smaller than quarter 3. COLOR: "What color is it?" "Is there more than one color?"     Grayish white with redness 4. SHAPE: "What shape is it?" (e.g., round, irregular)     round 5. RAISED: "Does it stick up above the skin or is it flat?" (e.g., raised or elevated)     raised 6. TENDER: "Does it hurt when you touch it?"  (Scale 1-10; or mild, moderate, severe)     painful 7. LOCATION: "Where is it located?"      Right back side og knee 8. ONSET: "When did it first appear?"      2-3 weeks  9. NUMBER: "Is there just one?" or "Are there others?"     1 10. CAUSE: "What do you think it is?"       unknown 11. OTHER SYMPTOMS: "Do you have any other symptoms?" (e.g., fever)       nausea 12. PREGNANCY: "Is there any  chance you are pregnant?" "When was your last menstrual period?"       N/a  Protocols used: Skin Lesion - Moles or Growths-A-AH

## 2024-03-23 ENCOUNTER — Other Ambulatory Visit: Payer: Self-pay | Admitting: Gastroenterology

## 2024-03-28 ENCOUNTER — Other Ambulatory Visit: Payer: Self-pay | Admitting: Student in an Organized Health Care Education/Training Program

## 2024-03-28 DIAGNOSIS — J301 Allergic rhinitis due to pollen: Secondary | ICD-10-CM

## 2024-03-28 NOTE — Telephone Encounter (Signed)
 NOT [facility] PATIENT OR PROVIDER

## 2024-03-29 ENCOUNTER — Ambulatory Visit: Admitting: Student in an Organized Health Care Education/Training Program

## 2024-04-01 ENCOUNTER — Encounter (HOSPITAL_COMMUNITY): Payer: Self-pay

## 2024-04-01 ENCOUNTER — Other Ambulatory Visit: Payer: Self-pay

## 2024-04-01 ENCOUNTER — Emergency Department (HOSPITAL_COMMUNITY)
Admission: EM | Admit: 2024-04-01 | Discharge: 2024-04-01 | Disposition: A | Discharge status: Home / Self Care | Attending: Student | Admitting: Student

## 2024-04-01 ENCOUNTER — Emergency Department (HOSPITAL_COMMUNITY)

## 2024-04-01 DIAGNOSIS — R112 Nausea with vomiting, unspecified: Secondary | ICD-10-CM | POA: Diagnosis not present

## 2024-04-01 DIAGNOSIS — R1032 Left lower quadrant pain: Secondary | ICD-10-CM | POA: Insufficient documentation

## 2024-04-01 DIAGNOSIS — D696 Thrombocytopenia, unspecified: Secondary | ICD-10-CM | POA: Insufficient documentation

## 2024-04-01 DIAGNOSIS — M549 Dorsalgia, unspecified: Secondary | ICD-10-CM | POA: Diagnosis not present

## 2024-04-01 DIAGNOSIS — R7401 Elevation of levels of liver transaminase levels: Secondary | ICD-10-CM | POA: Diagnosis not present

## 2024-04-01 DIAGNOSIS — R609 Edema, unspecified: Secondary | ICD-10-CM | POA: Diagnosis not present

## 2024-04-01 DIAGNOSIS — R109 Unspecified abdominal pain: Secondary | ICD-10-CM | POA: Diagnosis not present

## 2024-04-01 DIAGNOSIS — R231 Pallor: Secondary | ICD-10-CM | POA: Diagnosis not present

## 2024-04-01 DIAGNOSIS — R739 Hyperglycemia, unspecified: Secondary | ICD-10-CM | POA: Diagnosis not present

## 2024-04-01 DIAGNOSIS — Z7951 Long term (current) use of inhaled steroids: Secondary | ICD-10-CM | POA: Diagnosis not present

## 2024-04-01 DIAGNOSIS — R197 Diarrhea, unspecified: Secondary | ICD-10-CM | POA: Diagnosis not present

## 2024-04-01 DIAGNOSIS — I10 Essential (primary) hypertension: Secondary | ICD-10-CM | POA: Diagnosis not present

## 2024-04-01 DIAGNOSIS — R1084 Generalized abdominal pain: Secondary | ICD-10-CM | POA: Diagnosis not present

## 2024-04-01 DIAGNOSIS — K4090 Unilateral inguinal hernia, without obstruction or gangrene, not specified as recurrent: Secondary | ICD-10-CM | POA: Diagnosis not present

## 2024-04-01 DIAGNOSIS — J45909 Unspecified asthma, uncomplicated: Secondary | ICD-10-CM | POA: Insufficient documentation

## 2024-04-01 LAB — CBC WITH DIFFERENTIAL/PLATELET
Abs Immature Granulocytes: 0.03 10*3/uL (ref 0.00–0.07)
Basophils Absolute: 0 10*3/uL (ref 0.0–0.1)
Basophils Relative: 0 %
Eosinophils Absolute: 0.2 10*3/uL (ref 0.0–0.5)
Eosinophils Relative: 2 %
HCT: 47.4 % (ref 39.0–52.0)
Hemoglobin: 16.2 g/dL (ref 13.0–17.0)
Immature Granulocytes: 0 %
Lymphocytes Relative: 17 %
Lymphs Abs: 1.7 10*3/uL (ref 0.7–4.0)
MCH: 28.9 pg (ref 26.0–34.0)
MCHC: 34.2 g/dL (ref 30.0–36.0)
MCV: 84.6 fL (ref 80.0–100.0)
Monocytes Absolute: 0.7 10*3/uL (ref 0.1–1.0)
Monocytes Relative: 7 %
Neutro Abs: 7.6 10*3/uL (ref 1.7–7.7)
Neutrophils Relative %: 74 %
Platelets: 129 10*3/uL — ABNORMAL LOW (ref 150–400)
RBC: 5.6 MIL/uL (ref 4.22–5.81)
RDW: 13.3 % (ref 11.5–15.5)
WBC: 10.2 10*3/uL (ref 4.0–10.5)
nRBC: 0 % (ref 0.0–0.2)

## 2024-04-01 LAB — URINALYSIS, ROUTINE W REFLEX MICROSCOPIC
Bilirubin Urine: NEGATIVE
Glucose, UA: NEGATIVE mg/dL
Hgb urine dipstick: NEGATIVE
Ketones, ur: NEGATIVE mg/dL
Leukocytes,Ua: NEGATIVE
Nitrite: NEGATIVE
Protein, ur: NEGATIVE mg/dL
Specific Gravity, Urine: 1.014 (ref 1.005–1.030)
pH: 5 (ref 5.0–8.0)

## 2024-04-01 LAB — COMPREHENSIVE METABOLIC PANEL WITH GFR
ALT: 50 U/L — ABNORMAL HIGH (ref 0–44)
AST: 40 U/L (ref 15–41)
Albumin: 4.8 g/dL (ref 3.5–5.0)
Alkaline Phosphatase: 81 U/L (ref 38–126)
Anion gap: 7 (ref 5–15)
BUN: 12 mg/dL (ref 6–20)
CO2: 29 mmol/L (ref 22–32)
Calcium: 9.6 mg/dL (ref 8.9–10.3)
Chloride: 101 mmol/L (ref 98–111)
Creatinine, Ser: 0.7 mg/dL (ref 0.61–1.24)
GFR, Estimated: >60 mL/min (ref >60)
Glucose, Bld: 121 mg/dL — ABNORMAL HIGH (ref 70–99)
Potassium: 3.5 mmol/L (ref 3.5–5.1)
Sodium: 137 mmol/L (ref 135–145)
Total Bilirubin: 1.1 mg/dL (ref 0.0–1.2)
Total Protein: 7.8 g/dL (ref 6.5–8.1)

## 2024-04-01 LAB — LIPASE, BLOOD: Lipase: 47 U/L (ref 11–51)

## 2024-04-01 MED ORDER — ONDANSETRON HCL 4 MG PO TABS: 4.0000 mg | ORAL_TABLET | Freq: Four times a day (QID) | ORAL | 0 refills | Status: DC

## 2024-04-01 MED ORDER — ONDANSETRON HCL 4 MG/2ML IJ SOLN
4.0000 mg | Freq: Once | INTRAMUSCULAR | Status: AC
  Administered 2024-04-01: 4 mg via INTRAVENOUS
  Filled 2024-04-01: qty 2

## 2024-04-01 MED ORDER — SODIUM CHLORIDE 0.9 % IV BOLUS
1000.0000 mL | Freq: Once | INTRAVENOUS | Status: AC
  Administered 2024-04-01: 1000 mL via INTRAVENOUS

## 2024-04-01 MED ORDER — KETOROLAC TROMETHAMINE 15 MG/ML IJ SOLN
15.0000 mg | Freq: Once | INTRAMUSCULAR | Status: AC
  Administered 2024-04-01: 15 mg via INTRAVENOUS
  Filled 2024-04-01: qty 1

## 2024-04-01 MED ORDER — ACETAMINOPHEN 500 MG PO TABS
1000.0000 mg | ORAL_TABLET | Freq: Once | ORAL | Status: AC
  Administered 2024-04-01: 1000 mg via ORAL
  Filled 2024-04-01: qty 2

## 2024-04-01 MED ORDER — IOHEXOL 300 MG/ML  SOLN
100.0000 mL | Freq: Once | INTRAMUSCULAR | Status: AC | PRN
  Administered 2024-04-01: 100 mL via INTRAVENOUS

## 2024-04-01 MED ORDER — MORPHINE SULFATE (PF) 4 MG/ML IV SOLN
4.0000 mg | Freq: Once | INTRAVENOUS | Status: AC
  Administered 2024-04-01: 4 mg via INTRAVENOUS
  Filled 2024-04-01: qty 1

## 2024-04-01 NOTE — ED Triage Notes (Signed)
 Pt BIB EMS from Home due to a sudden onset of Left sided abdominal pain, n/v, left low back pain this morning. Pt denies problems with passing stool or urinating. Pt reports currently being seen for frequent loose stools.   HR 80 BP 115/70 127 CBG SpO2 95% Morphine  10mg  IV 18 ga RAC 4mg  Zofran 

## 2024-04-01 NOTE — Discharge Instructions (Addendum)
 Evaluation for your abdominal pain, nausea vomiting diarrhea was overall reassuring.  If your abdominal pain worsens, you develop a fever, blood in the stool or urine or vomit, cannot tolerate fluid intake or any other concerning symptom please return to the ED for further evaluation.  Otherwise please follow-up with your PCP and your GI doctor.  Sent Zofran  to your pharmacy to help with nausea and vomiting..  Treatment at home will be supportive primarily with assertive hydration and advancing her diet as tolerated.

## 2024-04-01 NOTE — ED Provider Notes (Signed)
 [place] EMERGENCY DEPARTMENT AT [facility] Provider Note   CSN: [id number] Arrival date & time: 04/01/24  1055     History  Chief Complaint  Patient presents with   Abdominal Pain   Emesis   Back Pain   HPI Alan Sanders is a 53 y.o. male with history of IBS, GERD, asthma presenting for abdominal pain.  Started acutely about 2 hours ago.  It is in the left flank and radiates to the left side of the abdomen.  Endorses some malodorous urine but no other urinary symptoms.  Also states he has been having nausea vomiting diarrhea as well has been going on for couple days.  Denies fever.  Feels like a sharp burning pain.  Denies pain radiating into the chest and shortness of breath.   Abdominal Pain Associated symptoms: vomiting   Emesis Associated symptoms: abdominal pain   Back Pain Associated symptoms: abdominal pain        Home Medications Prior to Admission medications   Medication Sig Start Date End Date Taking? Authorizing Provider  ondansetron  (ZOFRAN ) 4 MG tablet Take 1 tablet (4 mg total) by mouth every 6 (six) hours. 04/01/24  Yes Meklit Cotta K, PA-C  albuterol  (VENTOLIN  HFA) 108 (90 Base) MCG/ACT inhaler Inhale 1-2 puffs into the lungs every 6 (six) hours as needed for wheezing or shortness of breath. Patient taking differently: Inhale 2-3 puffs into the lungs as needed for wheezing or shortness of breath. 07/18/22   Graig Lawyer, MD  amitriptyline  (ELAVIL ) 10 MG tablet TAKE 1 TABLET BEFORE BEDTIME FOR 2 WEEKS THEN INCREASE TO 2 TABLETS (20 MG) AT BEDTIME 03/23/24   Elois Hair, MD  ascorbic acid  (VITAMIN C ) 1000 MG tablet Take 1 tablet (1,000 mg total) by mouth daily. 03/27/23   Marisela Sicks, PA-C  cetirizine  (ZYRTEC ) 10 MG tablet Take 1 tablet (10 mg total) by mouth daily. 03/18/24   Ether Hercules, MD  dicyclomine  (BENTYL ) 20 MG tablet TAKE 1 TABLET (20 MG TOTAL) BY MOUTH EVERY 6 (SIX) HOURS AS NEEDED FOR SPASMS. 01/07/24   Elois Hair, MD  DULoxetine  (CYMBALTA ) 30 MG capsule Take 1 capsule (30 mg total) by mouth daily. 03/01/24   Ether Hercules, MD  fluticasone  (FLONASE ) 50 MCG/ACT nasal spray Place 2 sprays into both nostrils daily. 03/18/24   Ether Hercules, MD  gabapentin  (NEURONTIN ) 300 MG capsule Take 1 capsule (300 mg total) by mouth 3 (three) times daily. 03/01/24   Ether Hercules, MD  methocarbamol  (ROBAXIN ) 750 MG tablet Take 1 tablet (750 mg total) by mouth daily as needed for muscle spasms. 03/01/24   Ether Hercules, MD  ondansetron  (ZOFRAN -ODT) 4 MG disintegrating tablet Take 1 tablet (4 mg total) by mouth every 8 (eight) hours as needed. 12/11/23   Hubbard Butter, PA  pantoprazole  (PROTONIX ) 40 MG tablet Take 1 tablet (40 mg total) by mouth daily. 03/01/24 02/24/25  Elois Hair, MD  tamsulosin  (FLOMAX ) 0.4 MG CAPS capsule Take 1 capsule (0.4 mg total) by mouth daily after breakfast. 11/04/23   Spence Dux, PA-C      Allergies    Hydromorphone     Review of Systems   Review of Systems  Gastrointestinal:  Positive for abdominal pain and vomiting.  Musculoskeletal:  Positive for back pain.    Physical Exam Updated Vital Signs BP (!) 135/91 (BP Location: Right Arm)   Pulse 78   Temp 97.8 F (36.6 C) (Oral)  Resp 18   Ht 6\' 1"  (1.854 m)   Wt 103 kg   SpO2 95%   BMI 29.95 kg/m  Physical Exam Vitals and nursing note reviewed.  HENT:     Head: Normocephalic and atraumatic.     Mouth/Throat:     Mouth: Mucous membranes are moist.  Eyes:     General:        Right eye: No discharge.        Left eye: No discharge.     Conjunctiva/sclera: Conjunctivae normal.  Cardiovascular:     Rate and Rhythm: Normal rate and regular rhythm.     Pulses: Normal pulses.     Heart sounds: Normal heart sounds.  Pulmonary:     Effort: Pulmonary effort is normal.     Breath sounds: Normal breath sounds.  Abdominal:     General: Abdomen is flat.     Palpations: Abdomen is  soft.     Tenderness: There is abdominal tenderness in the left lower quadrant. There is left CVA tenderness.  Skin:    General: Skin is warm and dry.  Neurological:     General: No focal deficit present.  Psychiatric:        Mood and Affect: Mood normal.     ED Results / Procedures / Treatments   Labs (all labs ordered are listed, but only abnormal results are displayed) Labs Reviewed  CBC WITH DIFFERENTIAL/PLATELET - Abnormal; Notable for the following components:      Result Value   Platelets 129 (*)    All other components within normal limits  COMPREHENSIVE METABOLIC PANEL WITH GFR - Abnormal; Notable for the following components:   Glucose, Bld 121 (*)    ALT 50 (*)    All other components within normal limits  LIPASE, BLOOD  URINALYSIS, ROUTINE W REFLEX MICROSCOPIC    EKG None  Radiology CT ABDOMEN PELVIS W CONTRAST Result Date: 04/01/2024 CLINICAL DATA:  Acute abdominal pain EXAM: CT ABDOMEN AND PELVIS WITH CONTRAST TECHNIQUE: Multidetector CT imaging of the abdomen and pelvis was performed using the standard protocol following bolus administration of intravenous contrast. RADIATION DOSE REDUCTION: This exam was performed according to the departmental dose-optimization program which includes automated exposure control, adjustment of the mA and/or kV according to patient size and/or use of iterative reconstruction technique. CONTRAST:  OMNIPAQUE  IOHEXOL  300 MG/ML  SOLN COMPARISON:  CT of the abdomen and pelvis performed December 17, 2023 FINDINGS: Lower chest: No acute abnormality. Hepatobiliary: No focal liver abnormality is seen. No gallstones, gallbladder wall thickening, or biliary dilatation. Pancreas: Unremarkable. No pancreatic ductal dilatation or surrounding inflammatory changes. Spleen: Normal in size without focal abnormality. Adrenals/Urinary Tract: Adrenal glands are unremarkable. Kidneys are normal, without renal calculi, focal lesion, or hydronephrosis.  Bladder is unremarkable. Stomach/Bowel: Stomach is within normal limits. No dilated loops of small bowel are appreciated. A moderate volume of liquid stool material is present within the colon. There is no evidence of diverticulitis or significant wall thickening. The appendix is well seen and is within normal limits. Vascular/Lymphatic: No abdominal aortic aneurysm. Reproductive: Unchanged. Other: Fat containing left inguinal hernia. Musculoskeletal: No acute or significant osseous findings. IMPRESSION: 1. No evidence of appendicitis or diverticulitis. 2. A moderate volume of liquid stool material is present throughout the colon and rectum. Electronically Signed   By: Reagan Camera M.D.   On: 04/01/2024 14:34    Procedures Procedures    Medications Ordered in ED Medications  morphine  (PF) 4 MG/ML injection  4 mg (4 mg Intravenous Given 04/01/24 1217)  ondansetron  (ZOFRAN ) injection 4 mg (4 mg Intravenous Given 04/01/24 1216)  sodium chloride  0.9 % bolus 1,000 mL (0 mLs Intravenous Stopped 04/01/24 1431)  morphine  (PF) 4 MG/ML injection 4 mg (4 mg Intravenous Given 04/01/24 1431)  acetaminophen  (TYLENOL ) tablet 1,000 mg (1,000 mg Oral Given 04/01/24 1429)  iohexol  (OMNIPAQUE ) 300 MG/ML solution 100 mL (100 mLs Intravenous Contrast Given 04/01/24 1342)    ED Course/ Medical Decision Making/ A&P                                 Medical Decision Making Amount and/or Complexity of Data Reviewed Labs: ordered. Radiology: ordered.  Risk OTC drugs. Prescription drug management.   Initial Impression and Ddx 53 year old well-appearing male presenting for abdominal pain.  Exam notable for left-sided CVA and left lower quadrant tenderness.  DDx includes kidney stone, pyelonephritis, diverticulitis, rib fracture, pneumothorax, bowel obstruction, acute cholecystitis, other. Patient PMH that increases complexity of ED encounter:  history of IBS, GERD, asthma  Interpretation of Diagnostics - I independent  reviewed and interpreted the labs as followed: hyperglycemia (121), ALT elevated (50), thrombocytopenia (129)  - I independently visualized the following imaging with scope of interpretation limited to determining acute life threatening conditions related to emergency care: CT ab/pelvis, which revealed no acute findings but did reveal moderate amount of liquid stool  Patient Reassessment and Ultimate Disposition/Management Patient abdominal pain improved.  Workup overall reassuring.  CT scan was unremarkable with no evidence of intra-abdominal infection or kidney stone.  Suspect his symptoms could be viral in nature.  His fluid challenge without issue.  Advised supportive treatment at home.  Sent Zofran  to his pharmacy.  Advised to follow-up PCP and GI as planned.  Discharged good condition.  Patient management required discussion with the following services or consulting groups:  None  Complexity of Problems Addressed Acute complicated illness or Injury  Additional Data Reviewed and Analyzed Further history obtained from: Past medical history and medications listed in the EMR and Prior ED visit notes  Patient Encounter Risk Assessment Prescriptions         Final Clinical Impression(s) / ED Diagnoses Final diagnoses:  Nausea vomiting and diarrhea  Abdominal pain, unspecified abdominal location    Rx / DC Orders ED Discharge Orders          Ordered    ondansetron  (ZOFRAN ) 4 MG tablet  Every 6 hours        04/01/24 1443              Janalee Mcmurray, PA-C 04/01/24 1444    Kommor, Alyse July, MD 04/01/24 [phone]

## 2024-04-01 NOTE — ED Notes (Signed)
Pt tolerated fluids 

## 2024-04-20 ENCOUNTER — Encounter (HOSPITAL_BASED_OUTPATIENT_CLINIC_OR_DEPARTMENT_OTHER): Payer: Self-pay | Admitting: *Deleted

## 2024-04-20 ENCOUNTER — Other Ambulatory Visit: Payer: Self-pay

## 2024-04-20 ENCOUNTER — Emergency Department (HOSPITAL_BASED_OUTPATIENT_CLINIC_OR_DEPARTMENT_OTHER)
Admission: EM | Admit: 2024-04-20 | Discharge: 2024-04-20 | Disposition: A | Discharge status: Home / Self Care | Attending: Emergency Medicine | Admitting: Emergency Medicine

## 2024-04-20 DIAGNOSIS — R519 Headache, unspecified: Secondary | ICD-10-CM | POA: Diagnosis not present

## 2024-04-20 DIAGNOSIS — H00014 Hordeolum externum left upper eyelid: Secondary | ICD-10-CM | POA: Diagnosis not present

## 2024-04-20 DIAGNOSIS — H00036 Abscess of eyelid left eye, unspecified eyelid: Secondary | ICD-10-CM | POA: Insufficient documentation

## 2024-04-20 DIAGNOSIS — H0289 Other specified disorders of eyelid: Secondary | ICD-10-CM | POA: Diagnosis present

## 2024-04-20 DIAGNOSIS — H00016 Hordeolum externum left eye, unspecified eyelid: Secondary | ICD-10-CM | POA: Diagnosis not present

## 2024-04-20 MED ORDER — DIPHENHYDRAMINE HCL 50 MG/ML IJ SOLN
25.0000 mg | Freq: Once | INTRAMUSCULAR | Status: AC
  Administered 2024-04-20: 25 mg via INTRAVENOUS
  Filled 2024-04-20: qty 1

## 2024-04-20 MED ORDER — SODIUM CHLORIDE 0.9 % IV BOLUS
1000.0000 mL | Freq: Once | INTRAVENOUS | Status: AC
  Administered 2024-04-20: 1000 mL via INTRAVENOUS

## 2024-04-20 MED ORDER — KETOROLAC TROMETHAMINE 30 MG/ML IJ SOLN
30.0000 mg | Freq: Once | INTRAMUSCULAR | Status: AC
  Administered 2024-04-20: 30 mg via INTRAVENOUS
  Filled 2024-04-20: qty 1

## 2024-04-20 MED ORDER — CEPHALEXIN 500 MG PO CAPS: 500.0000 mg | ORAL_CAPSULE | Freq: Four times a day (QID) | ORAL | 0 refills | Status: DC

## 2024-04-20 MED ORDER — DEXAMETHASONE SODIUM PHOSPHATE 10 MG/ML IJ SOLN
10.0000 mg | Freq: Once | INTRAMUSCULAR | Status: AC
  Administered 2024-04-20: 10 mg via INTRAVENOUS
  Filled 2024-04-20: qty 1

## 2024-04-20 MED ORDER — METOCLOPRAMIDE HCL 5 MG/ML IJ SOLN
10.0000 mg | Freq: Once | INTRAMUSCULAR | Status: AC
  Administered 2024-04-20: 10 mg via INTRAVENOUS
  Filled 2024-04-20: qty 2

## 2024-04-20 NOTE — ED Triage Notes (Signed)
 Pt is here for eye pain and swelling which has been increasing since Monday.  Vision is not affected but eyelid is swollen, no redness or drainage from eye, pt denies any injury.  Pt also reports headache which began yesterday.

## 2024-04-20 NOTE — Discharge Instructions (Signed)
 Begin taking Keflex  as prescribed.  Apply warm compresses as frequently as possible to your eyelid.  Follow-up with primary doctor if not improving in the next few days.

## 2024-04-20 NOTE — ED Provider Notes (Signed)
 [place] EMERGENCY DEPARTMENT AT [facility] Provider Note   CSN: [id number] Arrival date & time: 04/20/24  0220     History  Chief Complaint  Patient presents with   Eye Problem   Headache    Alan Sanders is a 53 y.o. male.  Patient is a 53 year old male presenting with complaints of left eyelid pain and swelling.  This has been worsening over the past 2 days.  He denies any injury or trauma.  No eye redness or changes in vision.  He also has history of migraines and believes that his swollen eyelid is triggering a migraine.  No fevers or chills.  No stiff neck.       Home Medications Prior to Admission medications   Medication Sig Start Date End Date Taking? Authorizing Provider  albuterol  (VENTOLIN  HFA) 108 (90 Base) MCG/ACT inhaler Inhale 1-2 puffs into the lungs every 6 (six) hours as needed for wheezing or shortness of breath. Patient taking differently: Inhale 2-3 puffs into the lungs as needed for wheezing or shortness of breath. 07/18/22   Graig Lawyer, MD  amitriptyline  (ELAVIL ) 10 MG tablet TAKE 1 TABLET BEFORE BEDTIME FOR 2 WEEKS THEN INCREASE TO 2 TABLETS (20 MG) AT BEDTIME 03/23/24   Elois Hair, MD  ascorbic acid  (VITAMIN C ) 1000 MG tablet Take 1 tablet (1,000 mg total) by mouth daily. 03/27/23   Marisela Sicks, PA-C  cetirizine  (ZYRTEC ) 10 MG tablet Take 1 tablet (10 mg total) by mouth daily. 03/18/24   Ether Hercules, MD  dicyclomine  (BENTYL ) 20 MG tablet TAKE 1 TABLET (20 MG TOTAL) BY MOUTH EVERY 6 (SIX) HOURS AS NEEDED FOR SPASMS. 01/07/24   Elois Hair, MD  DULoxetine  (CYMBALTA ) 30 MG capsule Take 1 capsule (30 mg total) by mouth daily. 03/01/24   Ether Hercules, MD  fluticasone  (FLONASE ) 50 MCG/ACT nasal spray Place 2 sprays into both nostrils daily. 03/18/24   Ether Hercules, MD  gabapentin  (NEURONTIN ) 300 MG capsule Take 1 capsule (300 mg total) by mouth 3 (three) times daily. 03/01/24   Ether Hercules, MD   methocarbamol  (ROBAXIN ) 750 MG tablet Take 1 tablet (750 mg total) by mouth daily as needed for muscle spasms. 03/01/24   Ether Hercules, MD  ondansetron  (ZOFRAN ) 4 MG tablet Take 1 tablet (4 mg total) by mouth every 6 (six) hours. 04/01/24   Robinson, John K, PA-C  ondansetron  (ZOFRAN -ODT) 4 MG disintegrating tablet Take 1 tablet (4 mg total) by mouth every 8 (eight) hours as needed. 12/11/23   Cornucopia Butter, PA  pantoprazole  (PROTONIX ) 40 MG tablet Take 1 tablet (40 mg total) by mouth daily. 03/01/24 02/24/25  Elois Hair, MD  tamsulosin  (FLOMAX ) 0.4 MG CAPS capsule Take 1 capsule (0.4 mg total) by mouth daily after breakfast. 11/04/23   Spence Dux, PA-C      Allergies    Hydromorphone     Review of Systems   Review of Systems  All other systems reviewed and are negative.   Physical Exam Updated Vital Signs BP 136/86 (BP Location: Right Arm)   Pulse 76   Temp 97.7 F (36.5 C) (Oral)   Resp 16   SpO2 98%  Physical Exam Vitals and nursing note reviewed.  Constitutional:      General: He is not in acute distress.    Appearance: He is well-developed. He is not diaphoretic.  HENT:     Head: Normocephalic and atraumatic.  Eyes:  Comments: The left eyelid is swollen and erythematous.  It is tender to the touch.  The eye itself is normal in appearance.  The cornea is clear, conjunctiva is clear, anterior chamber is clear, and pupil is reactive.  Cardiovascular:     Rate and Rhythm: Normal rate and regular rhythm.     Heart sounds: No murmur heard.    No friction rub.  Pulmonary:     Effort: Pulmonary effort is normal. No respiratory distress.     Breath sounds: Normal breath sounds. No wheezing or rales.  Abdominal:     General: Bowel sounds are normal. There is no distension.     Palpations: Abdomen is soft.     Tenderness: There is no abdominal tenderness.  Musculoskeletal:        General: Normal range of motion.     Cervical back: Normal range of motion and  neck supple.  Skin:    General: Skin is warm and dry.  Neurological:     Mental Status: He is alert and oriented to person, place, and time.     Coordination: Coordination normal.     ED Results / Procedures / Treatments   Labs (all labs ordered are listed, but only abnormal results are displayed) Labs Reviewed - No data to display  EKG None  Radiology No results found.  Procedures Procedures    Medications Ordered in ED Medications  sodium chloride  0.9 % bolus 1,000 mL (has no administration in time range)  ketorolac  (TORADOL ) 30 MG/ML injection 30 mg (has no administration in time range)  metoCLOPramide  (REGLAN ) injection 10 mg (has no administration in time range)  diphenhydrAMINE  (BENADRYL ) injection 25 mg (has no administration in time range)  dexamethasone  (DECADRON ) injection 10 mg (has no administration in time range)    ED Course/ Medical Decision Making/ A&P  Patient is a 53 year old male presenting with swelling to his left eyelid and headache.  The left eyelid appears to be a stye with surrounding cellulitis.  This will be treated with warm compresses and Keflex .  Patient also has a migraine headache which was relieved with a migraine cocktail.  He is neurologically intact and I do not feel as though requires imaging.  Final Clinical Impression(s) / ED Diagnoses Final diagnoses:  None    Rx / DC Orders ED Discharge Orders     None         Orvilla Blander, MD 04/20/24 [phone]

## 2024-04-23 ENCOUNTER — Other Ambulatory Visit: Payer: Self-pay

## 2024-04-23 ENCOUNTER — Emergency Department (HOSPITAL_COMMUNITY)

## 2024-04-23 ENCOUNTER — Emergency Department (HOSPITAL_COMMUNITY)
Admission: EM | Admit: 2024-04-23 | Discharge: 2024-04-24 | Disposition: A | Discharge status: Home / Self Care | Attending: Emergency Medicine | Admitting: Emergency Medicine

## 2024-04-23 ENCOUNTER — Encounter (HOSPITAL_COMMUNITY): Payer: Self-pay

## 2024-04-23 DIAGNOSIS — R0789 Other chest pain: Secondary | ICD-10-CM | POA: Insufficient documentation

## 2024-04-23 DIAGNOSIS — M549 Dorsalgia, unspecified: Secondary | ICD-10-CM | POA: Diagnosis present

## 2024-04-23 DIAGNOSIS — Y92481 Parking lot as the place of occurrence of the external cause: Secondary | ICD-10-CM | POA: Insufficient documentation

## 2024-04-23 DIAGNOSIS — R918 Other nonspecific abnormal finding of lung field: Secondary | ICD-10-CM | POA: Diagnosis not present

## 2024-04-23 MED ORDER — KETOROLAC TROMETHAMINE 15 MG/ML IJ SOLN
15.0000 mg | Freq: Once | INTRAMUSCULAR | Status: AC
  Administered 2024-04-24: 15 mg via INTRAMUSCULAR
  Filled 2024-04-23: qty 1

## 2024-04-23 MED ORDER — HYDROCODONE-ACETAMINOPHEN 5-325 MG PO TABS
1.0000 | ORAL_TABLET | Freq: Once | ORAL | Status: AC
  Administered 2024-04-24: 1 via ORAL
  Filled 2024-04-23: qty 1
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
## 2024-04-23 NOTE — ED Notes (Signed)
 ED Provider at bedside.

## 2024-04-24 ENCOUNTER — Emergency Department (HOSPITAL_COMMUNITY)

## 2024-04-24 DIAGNOSIS — M47814 Spondylosis without myelopathy or radiculopathy, thoracic region: Secondary | ICD-10-CM | POA: Diagnosis not present

## 2024-04-24 DIAGNOSIS — M47816 Spondylosis without myelopathy or radiculopathy, lumbar region: Secondary | ICD-10-CM | POA: Diagnosis not present

## 2024-04-24 DIAGNOSIS — S299XXA Unspecified injury of thorax, initial encounter: Secondary | ICD-10-CM | POA: Diagnosis not present

## 2024-04-24 MED ORDER — HYDROCODONE-ACETAMINOPHEN 5-325 MG PO TABS: 1.0000 | ORAL_TABLET | Freq: Four times a day (QID) | ORAL | 0 refills | Status: DC | PRN

## 2024-04-24 MED ORDER — CYCLOBENZAPRINE HCL 10 MG PO TABS
10.0000 mg | ORAL_TABLET | Freq: Two times a day (BID) | ORAL | 0 refills | Status: DC | PRN
Start: 2024-04-24 — End: 2024-05-12

## 2024-04-24 NOTE — ED Notes (Signed)
 Patient transported to CT
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
## 2024-04-24 NOTE — ED Notes (Signed)
Patient educated about not driving or performing other critical tasks (such as operating heavy machinery, caring for infant/toddler/child) due to sedative nature of narcotic medications received while in the ED.  Pt/caregiver verbalized understanding.   

## 2024-04-27 ENCOUNTER — Other Ambulatory Visit: Payer: Self-pay | Admitting: Student in an Organized Health Care Education/Training Program

## 2024-04-27 DIAGNOSIS — G959 Disease of spinal cord, unspecified: Secondary | ICD-10-CM

## 2024-04-27 DIAGNOSIS — J301 Allergic rhinitis due to pollen: Secondary | ICD-10-CM

## 2024-04-27 NOTE — Telephone Encounter (Signed)
 NOT [facility] PATIENT OR PROVIDER

## 2024-04-28 NOTE — Telephone Encounter (Signed)
 NOT [facility] PATIENT OR PROVIDER

## 2024-05-12 ENCOUNTER — Emergency Department (HOSPITAL_BASED_OUTPATIENT_CLINIC_OR_DEPARTMENT_OTHER)
Admission: EM | Admit: 2024-05-12 | Discharge: 2024-05-12 | Disposition: A | Discharge status: Home / Self Care | Attending: Emergency Medicine | Admitting: Emergency Medicine

## 2024-05-12 ENCOUNTER — Other Ambulatory Visit: Payer: Self-pay

## 2024-05-12 ENCOUNTER — Emergency Department (HOSPITAL_BASED_OUTPATIENT_CLINIC_OR_DEPARTMENT_OTHER): Admitting: Radiology

## 2024-05-12 DIAGNOSIS — R0789 Other chest pain: Secondary | ICD-10-CM | POA: Insufficient documentation

## 2024-05-12 DIAGNOSIS — M25511 Pain in right shoulder: Secondary | ICD-10-CM | POA: Diagnosis present

## 2024-05-12 DIAGNOSIS — F17200 Nicotine dependence, unspecified, uncomplicated: Secondary | ICD-10-CM | POA: Insufficient documentation

## 2024-05-12 MED ORDER — KETOROLAC TROMETHAMINE 30 MG/ML IJ SOLN
30.0000 mg | Freq: Once | INTRAMUSCULAR | Status: AC
  Administered 2024-05-12: 30 mg via INTRAMUSCULAR
  Filled 2024-05-12: qty 1

## 2024-05-12 MED ORDER — LIDOCAINE 5 % EX PTCH: 1.0000 | MEDICATED_PATCH | CUTANEOUS | 0 refills | Status: DC

## 2024-05-12 MED ORDER — CYCLOBENZAPRINE HCL 10 MG PO TABS
10.0000 mg | ORAL_TABLET | Freq: Once | ORAL | Status: AC
  Administered 2024-05-12: 10 mg via ORAL
  Filled 2024-05-12: qty 1

## 2024-05-12 MED ORDER — CYCLOBENZAPRINE HCL 10 MG PO TABS: 10.0000 mg | ORAL_TABLET | Freq: Two times a day (BID) | ORAL | 0 refills | Status: DC | PRN

## 2024-05-12 MED ORDER — LIDOCAINE 5 % EX PTCH
1.0000 | MEDICATED_PATCH | CUTANEOUS | Status: DC
  Administered 2024-05-12: 1 via TRANSDERMAL
  Filled 2024-05-12: qty 1

## 2024-05-12 MED ORDER — CELECOXIB 200 MG PO CAPS: 200.0000 mg | ORAL_CAPSULE | Freq: Two times a day (BID) | ORAL | 0 refills | Status: DC | PRN
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
## 2024-05-16 ENCOUNTER — Ambulatory Visit: Admitting: Student in an Organized Health Care Education/Training Program

## 2024-05-16 ENCOUNTER — Encounter: Payer: Self-pay | Admitting: Student in an Organized Health Care Education/Training Program

## 2024-05-16 VITALS — BP 117/82 | HR 91 | Wt 214.0 lb

## 2024-05-16 DIAGNOSIS — S46011A Strain of muscle(s) and tendon(s) of the rotator cuff of right shoulder, initial encounter: Secondary | ICD-10-CM | POA: Diagnosis not present
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
## 2024-05-16 NOTE — Assessment & Plan Note (Signed)
 Acute issue.  This was an injury sustained at work at [employer] on 6/19.  Seems to be improving slowly over the last few days.  He did a course of anti-inflammatories with Celebrex .  Has used a muscle relaxer as well which is probably not as helpful.  Exam is reassuring today.  Low risk for rotator cuff tears based on pretty good strength.  Has some mild impingement symptoms.  I suspect this might be improving rotator cuff strain, specifically the infraspinatus.  He is given try to go back to work this week on Wednesday which I think is a good idea.  I recommended stop using a sling, would like better range of motion of the shoulder, need to avoid adhesive capsulitis.  If he is unable to return to work on Wednesday due to shoulder pain, we will arrange for an MRI to look for rotator cuff tears.

## 2024-05-20 ENCOUNTER — Ambulatory Visit: Admitting: Gastroenterology

## 2024-05-28 ENCOUNTER — Other Ambulatory Visit: Payer: Self-pay | Admitting: Student in an Organized Health Care Education/Training Program

## 2024-05-28 ENCOUNTER — Other Ambulatory Visit: Payer: Self-pay | Admitting: Gastroenterology

## 2024-05-28 DIAGNOSIS — G959 Disease of spinal cord, unspecified: Secondary | ICD-10-CM

## 2024-06-16 ENCOUNTER — Other Ambulatory Visit: Payer: Self-pay | Admitting: Student in an Organized Health Care Education/Training Program

## 2024-06-16 DIAGNOSIS — G959 Disease of spinal cord, unspecified: Secondary | ICD-10-CM

## 2024-06-16 DIAGNOSIS — J301 Allergic rhinitis due to pollen: Secondary | ICD-10-CM

## 2024-06-17 ENCOUNTER — Ambulatory Visit: Admitting: Student in an Organized Health Care Education/Training Program

## 2024-06-20 ENCOUNTER — Encounter: Payer: Self-pay | Admitting: Student in an Organized Health Care Education/Training Program

## 2024-06-23 ENCOUNTER — Encounter (HOSPITAL_BASED_OUTPATIENT_CLINIC_OR_DEPARTMENT_OTHER): Payer: Self-pay

## 2024-06-23 ENCOUNTER — Other Ambulatory Visit: Payer: Self-pay

## 2024-06-23 ENCOUNTER — Emergency Department (HOSPITAL_BASED_OUTPATIENT_CLINIC_OR_DEPARTMENT_OTHER)
Admission: EM | Admit: 2024-06-23 | Discharge: 2024-06-23 | Disposition: A | Discharge status: Home / Self Care | Attending: Emergency Medicine | Admitting: Emergency Medicine

## 2024-06-23 ENCOUNTER — Emergency Department (HOSPITAL_BASED_OUTPATIENT_CLINIC_OR_DEPARTMENT_OTHER): Admitting: Radiology

## 2024-06-23 DIAGNOSIS — G43809 Other migraine, not intractable, without status migrainosus: Secondary | ICD-10-CM | POA: Insufficient documentation

## 2024-06-23 DIAGNOSIS — R0789 Other chest pain: Secondary | ICD-10-CM | POA: Insufficient documentation

## 2024-06-23 DIAGNOSIS — R1013 Epigastric pain: Secondary | ICD-10-CM | POA: Insufficient documentation

## 2024-06-23 DIAGNOSIS — F17200 Nicotine dependence, unspecified, uncomplicated: Secondary | ICD-10-CM | POA: Diagnosis not present

## 2024-06-23 DIAGNOSIS — J45909 Unspecified asthma, uncomplicated: Secondary | ICD-10-CM | POA: Insufficient documentation

## 2024-06-23 DIAGNOSIS — R079 Chest pain, unspecified: Secondary | ICD-10-CM | POA: Diagnosis not present

## 2024-06-23 DIAGNOSIS — R519 Headache, unspecified: Secondary | ICD-10-CM | POA: Diagnosis present

## 2024-06-23 LAB — BASIC METABOLIC PANEL WITH GFR
Anion gap: 14 (ref 5–15)
BUN: 10 mg/dL (ref 6–20)
CO2: 25 mmol/L (ref 22–32)
Calcium: 9.5 mg/dL (ref 8.9–10.3)
Chloride: 102 mmol/L (ref 98–111)
Creatinine, Ser: 0.84 mg/dL (ref 0.61–1.24)
GFR, Estimated: >60 mL/min (ref >60)
Glucose, Bld: 127 mg/dL — ABNORMAL HIGH (ref 70–99)
Potassium: 3.5 mmol/L (ref 3.5–5.1)
Sodium: 142 mmol/L (ref 135–145)

## 2024-06-23 LAB — TROPONIN T, HIGH SENSITIVITY
Troponin T High Sensitivity: 27 ng/L — ABNORMAL HIGH (ref <19)
Troponin T High Sensitivity: 24 ng/L — ABNORMAL HIGH (ref <19)

## 2024-06-23 LAB — CBC
HCT: 42.7 % (ref 39.0–52.0)
Hemoglobin: 15 g/dL (ref 13.0–17.0)
MCH: 29.1 pg (ref 26.0–34.0)
MCHC: 35.1 g/dL (ref 30.0–36.0)
MCV: 82.8 fL (ref 80.0–100.0)
Platelets: 135 K/uL — ABNORMAL LOW (ref 150–400)
RBC: 5.16 MIL/uL (ref 4.22–5.81)
RDW: 14.2 % (ref 11.5–15.5)
WBC: 8.8 K/uL (ref 4.0–10.5)
nRBC: 0 % (ref 0.0–0.2)

## 2024-06-23 LAB — HEPATIC FUNCTION PANEL
ALT: 63 U/L — ABNORMAL HIGH (ref 0–44)
AST: 47 U/L — ABNORMAL HIGH (ref 15–41)
Albumin: 4.7 g/dL (ref 3.5–5.0)
Alkaline Phosphatase: 101 U/L (ref 38–126)
Bilirubin, Direct: 0.3 mg/dL — ABNORMAL HIGH (ref 0.0–0.2)
Indirect Bilirubin: 0.3 mg/dL (ref 0.3–0.9)
Total Bilirubin: 0.6 mg/dL (ref 0.0–1.2)
Total Protein: 7.5 g/dL (ref 6.5–8.1)

## 2024-06-23 LAB — LIPASE, BLOOD: Lipase: 34 U/L (ref 11–51)

## 2024-06-23 MED ORDER — PROCHLORPERAZINE EDISYLATE 10 MG/2ML IJ SOLN
10.0000 mg | Freq: Once | INTRAMUSCULAR | Status: AC
  Administered 2024-06-23: 10 mg via INTRAVENOUS
  Filled 2024-06-23: qty 2

## 2024-06-23 MED ORDER — DIPHENHYDRAMINE HCL 50 MG/ML IJ SOLN
25.0000 mg | Freq: Once | INTRAMUSCULAR | Status: AC
  Administered 2024-06-23: 25 mg via INTRAVENOUS
  Filled 2024-06-23: qty 1

## 2024-06-23 MED ORDER — OMEPRAZOLE 20 MG PO CPDR: 20.0000 mg | DELAYED_RELEASE_CAPSULE | Freq: Every day | ORAL | 0 refills | Status: DC

## 2024-06-23 MED ORDER — ALUM & MAG HYDROXIDE-SIMETH 200-200-20 MG/5ML PO SUSP
30.0000 mL | Freq: Once | ORAL | Status: AC
  Administered 2024-06-23: 30 mL via ORAL
  Filled 2024-06-23: qty 30

## 2024-06-23 NOTE — ED Provider Notes (Signed)
 Alan Sanders EMERGENCY DEPARTMENT AT [facility] Provider Note   CSN: [id number] Arrival date & time: 06/23/24  [id number]     Patient presents with: Chest Pain   Alan Sanders is a 53 y.o. male.   HPI     This a 53 year old male who presents with headache and chest pain.  Patient reports that he had onset of a migraine headache around 7 PM.  This is not abnormal for him.  Pain is typical of his migraines.  He took 3 Tylenol  PM and three 800 mg ibuprofen .  Around 1 AM he noted sharp substernal chest pain and abdominal pain.  No nausea, vomiting, shortness of breath.  No fevers.  Nothing seems to make the pain better or worse.  Prior to Admission medications   Medication Sig Start Date End Date Taking? Authorizing Provider  omeprazole  (PRILOSEC) 20 MG capsule Take 1 capsule (20 mg total) by mouth daily. 06/23/24  Yes Romero Letizia, Charmaine FALCON, MD  albuterol  (VENTOLIN  HFA) 108 (90 Base) MCG/ACT inhaler Inhale 1-2 puffs into the lungs every 6 (six) hours as needed for wheezing or shortness of breath. 07/18/22   Thedora Garnette CHRISTELLA, MD  amitriptyline  (ELAVIL ) 10 MG tablet TAKE 1 TABLET BEFORE BEDTIME FOR 2 WEEKS THEN INCREASE TO 2 TABLETS (20 MG) AT BEDTIME 03/23/24   Stacia Glendia BRAVO, MD  ascorbic acid  (VITAMIN C ) 1000 MG tablet Take 1 tablet (1,000 mg total) by mouth daily. 03/27/23   Deward Eck, PA-C  celecoxib  (CELEBREX ) 200 MG capsule Take 1 capsule (200 mg total) by mouth 2 (two) times daily as needed. 05/12/24   Silver Wonda LABOR, PA  cephALEXin  (KEFLEX ) 500 MG capsule Take 1 capsule (500 mg total) by mouth 4 (four) times daily. 04/20/24   Geroldine Berg, MD  cetirizine  (ZYRTEC ) 10 MG tablet TAKE 1 TABLET BY MOUTH EVERY DAY 06/17/24   Jerrell Cleatus Ned, MD  cyclobenzaprine  (FLEXERIL ) 10 MG tablet Take 1 tablet (10 mg total) by mouth 2 (two) times daily as needed for muscle spasms. 05/12/24   Silver Wonda LABOR, PA  dicyclomine  (BENTYL ) 20 MG tablet TAKE 1 TABLET (20 MG TOTAL) BY MOUTH 2 (TWO)  TIMES DAILY AS NEEDED FOR SPASMS. 05/30/24   Stacia Glendia BRAVO, MD  DULoxetine  (CYMBALTA ) 30 MG capsule Take 1 capsule (30 mg total) by mouth daily. 03/01/24   Jerrell Cleatus Ned, MD  fluticasone  (FLONASE ) 50 MCG/ACT nasal spray SPRAY 2 SPRAYS INTO EACH NOSTRIL EVERY DAY (ONLY COVERED AT [facility]) 06/17/24   Vincent, Duncan Thomas, MD  gabapentin  (NEURONTIN ) 300 MG capsule TAKE 1 CAPSULE BY MOUTH THREE TIMES A DAY 05/30/24   Jerrell Cleatus Ned, MD  HYDROcodone -acetaminophen  (NORCO/VICODIN) 5-325 MG tablet Take 1 tablet by mouth every 6 (six) hours as needed. 04/24/24   Ruthell Lonni FALCON, PA-C  lidocaine  (LIDODERM ) 5 % Place 1 patch onto the skin daily. Remove & Discard patch within 12 hours or as directed by MD 05/12/24   Silver Wonda LABOR, PA  methocarbamol  (ROBAXIN ) 750 MG tablet TAKE 1 TABLET (750 MG TOTAL) BY MOUTH DAILY AS NEEDED FOR MUSCLE SPASMS 06/17/24   Jerrell Cleatus Ned, MD  ondansetron  (ZOFRAN ) 4 MG tablet Take 1 tablet (4 mg total) by mouth every 6 (six) hours. 04/01/24   Robinson, John K, PA-C  ondansetron  (ZOFRAN -ODT) 4 MG disintegrating tablet Take 1 tablet (4 mg total) by mouth every 8 (eight) hours as needed. 12/11/23   Silver Wonda LABOR, PA  pantoprazole  (PROTONIX ) 40 MG tablet Take 1 tablet (  40 mg total) by mouth daily. 03/01/24 02/24/25  Stacia Glendia BRAVO, MD  tamsulosin  (FLOMAX ) 0.4 MG CAPS capsule Take 1 capsule (0.4 mg total) by mouth daily after breakfast. 11/04/23   Bernis Ernst, PA-C    Allergies: Hydromorphone     Review of Systems  Constitutional:  Negative for fever.  Respiratory:  Negative for shortness of breath.   Cardiovascular:  Positive for chest pain. Negative for leg swelling.  Gastrointestinal:  Positive for abdominal pain.  Neurological:  Positive for headaches.  All other systems reviewed and are negative.   Updated Vital Signs BP 129/80   Pulse 67   Resp 20   Ht 1.88 m (6' 2)   Wt 103.4 kg   SpO2 97%   BMI 29.27 kg/m   Physical  Exam Vitals and nursing note reviewed.  Constitutional:      Appearance: He is well-developed. He is not ill-appearing.  HENT:     Head: Normocephalic and atraumatic.  Eyes:     Pupils: Pupils are equal, round, and reactive to light.  Cardiovascular:     Rate and Rhythm: Normal rate and regular rhythm.     Heart sounds: Normal heart sounds. No murmur heard. Pulmonary:     Effort: Pulmonary effort is normal. No respiratory distress.     Breath sounds: Normal breath sounds. No wheezing.  Abdominal:     General: Bowel sounds are normal.     Palpations: Abdomen is soft.     Tenderness: There is abdominal tenderness. There is no rebound.     Comments: Epigastric tenderness to palpation, no rebound or guarding  Musculoskeletal:     Cervical back: Neck supple.  Lymphadenopathy:     Cervical: No cervical adenopathy.  Skin:    General: Skin is warm and dry.  Neurological:     Mental Status: He is alert and oriented to person, place, and time.     Comments: Cranial nerves II through XII intact, fluent speech, 5 out of 5 strength in all 4 extremities  Psychiatric:        Mood and Affect: Mood normal.     (all labs ordered are listed, but only abnormal results are displayed) Labs Reviewed  BASIC METABOLIC PANEL WITH GFR - Abnormal; Notable for the following components:      Result Value   Glucose, Bld 127 (*)    All other components within normal limits  CBC - Abnormal; Notable for the following components:   Platelets 135 (*)    All other components within normal limits  HEPATIC FUNCTION PANEL - Abnormal; Notable for the following components:   AST 47 (*)    ALT 63 (*)    Bilirubin, Direct 0.3 (*)    All other components within normal limits  TROPONIN T, HIGH SENSITIVITY - Abnormal; Notable for the following components:   Troponin T High Sensitivity 27 (*)    All other components within normal limits  TROPONIN T, HIGH SENSITIVITY - Abnormal; Notable for the following  components:   Troponin T High Sensitivity 24 (*)    All other components within normal limits  LIPASE, BLOOD    EKG: EKG Interpretation Date/Time:  Thursday June 23 2024 01:28:28 EDT Ventricular Rate:  65 PR Interval:  150 QRS Duration:  85 QT Interval:  387 QTC Calculation: 403 R Axis:   79  Text Interpretation: Sinus rhythm Confirmed by Bari Pfeiffer ([id number]) on 06/23/2024 1:36:02 AM  Radiology: [facility] Chest 2 View Result Date: 06/23/2024 CLINICAL DATA:  Chest pain EXAM: CHEST - 2 VIEW COMPARISON:  05/12/2024 FINDINGS: The heart size and mediastinal contours are within normal limits. Both lungs are clear. The visualized skeletal structures are unremarkable. IMPRESSION: No active cardiopulmonary disease. Electronically Signed   By: Oneil Devonshire M.D.   On: 06/23/2024 02:16     Procedures   Medications Ordered in the ED  alum & mag hydroxide-simeth (MAALOX/MYLANTA) 200-200-20 MG/5ML suspension 30 mL (30 mLs Oral Given 06/23/24 0203)  prochlorperazine  (COMPAZINE ) injection 10 mg (10 mg Intravenous Given 06/23/24 0204)  diphenhydrAMINE  (BENADRYL ) injection 25 mg (25 mg Intravenous Given 06/23/24 0204)                                    Medical Decision Making Amount and/or Complexity of Data Reviewed Labs: ordered. Radiology: ordered.  Risk OTC drugs. Prescription drug management.   This patient presents to the ED for concern of headache, chest pain, this involves an extensive number of treatment options, and is a complaint that carries with it a high risk of complications and morbidity.  I considered the following differential and admission for this acute, potentially life threatening condition.  The differential diagnosis includes typical migraine, ACS, PE, pneumothorax, pneumonia gastritis  MDM:    This is a 53 year old male who presents with headache and chest pain.  He is nontoxic and vital signs are largely reassuring.  Reports headache is typical of his migraines.  No  red flags.  Onset of chest pain and abdominal pain several hours after taking multiple medications including a total of 2400 mg of ibuprofen .  He has some epigastric tenderness to palpation on exam.  Question gastritis.  Patient given a migraine cocktail and a GI cocktail.  EKG shows no evidence of acute ischemia or arrhythmia.  Troponin slightly elevated but flat from 27-24.  Lipase and LFTs are not reassuring.  On recheck, patient states he feels much better and symptoms have resolved.  Low suspicion for ACS.  Given correlation with significant ibuprofen  intake, with query gastritis as cause of chest pain and abdominal pain.  Will start on omeprazole  daily.  Will also give cardiology follow-up.  Patient was instructed not to take large amounts of NSAIDs.  (Labs, imaging, consults)  Labs: I Ordered, and personally interpreted labs.  The pertinent results include: CBC, CMP, lipase, troponin  Imaging Studies ordered: I ordered imaging studies including chest x-ray I independently visualized and interpreted imaging. I agree with the radiologist interpretation  Additional history obtained from chart review.  External records from outside source obtained and reviewed including prior evaluations  Cardiac Monitoring: The patient was maintained on a cardiac monitor.  If on the cardiac monitor, I personally viewed and interpreted the cardiac monitored which showed an underlying rhythm of: Sinus  Reevaluation: After the interventions noted above, I reevaluated the patient and found that they have :improved  Social Determinants of Health:  lives independently  Disposition: Discharge  Co morbidities that complicate the patient evaluation  Past Medical History:  Diagnosis Date   Anxiety    situational anxiety-on meds   Arthritis    neck/back/LEFT wrist   Asthma    PRN inhaler   Balance problem    Depression    on meds   Family history of colonic polyps 05/08/2022   Gait difficulty    GERD  (gastroesophageal reflux disease)    OTC PRN meds   Headache    migraines  History of colonic polyps 05/08/2022   Inguinal hernia of left side without obstruction or gangrene    Peyronie's disease 02/27/2022   Pneumonia    Seasonal allergies    Seizures (HCC)     its been along time ,since my last seizure    Spleen enlarged    Testosterone  deficiency in male 03/26/2023   Thrombocytopenia (HCC)    Tobacco abuse      Medicines Meds ordered this encounter  Medications   alum & mag hydroxide-simeth (MAALOX/MYLANTA) 200-200-20 MG/5ML suspension 30 mL   prochlorperazine  (COMPAZINE ) injection 10 mg   diphenhydrAMINE  (BENADRYL ) injection 25 mg   omeprazole  (PRILOSEC) 20 MG capsule    Sig: Take 1 capsule (20 mg total) by mouth daily.    Dispense:  30 capsule    Refill:  0    I have reviewed the patients home medicines and have made adjustments as needed  Problem List / ED Course: Problem List Items Addressed This Visit   None Visit Diagnoses       Other migraine without status migrainosus, not intractable    -  Primary     Atypical chest pain       Relevant Orders   Ambulatory referral to Cardiology                Final diagnoses:  Other migraine without status migrainosus, not intractable  Atypical chest pain    ED Discharge Orders          Ordered    Ambulatory referral to Cardiology        06/23/24 0424    omeprazole  (PRILOSEC) 20 MG capsule  Daily        06/23/24 0425               Bari Charmaine FALCON, MD 06/23/24 [phone]

## 2024-06-23 NOTE — ED Triage Notes (Signed)
 Migraine HA starting at 1900. Substernal CP and abd pain starting at 0100. Took tylenol  and ibuprofen  before arrival. Denies SOB, N/V.

## 2024-06-23 NOTE — Discharge Instructions (Signed)
 You were seen today for migraine headache and chest pain.  I suspect your chest pain may have been related to taking a significant amount of ibuprofen .  Avoid excessive use of ibuprofen .  This can give you gastritis.  Start omeprazole  daily.  Take your migraine medications otherwise as prescribed.

## 2024-07-07 ENCOUNTER — Ambulatory Visit: Payer: Self-pay

## 2024-07-07 NOTE — Telephone Encounter (Signed)
 FYI Only or Action Required?: FYI only for provider.  Patient was last seen in primary care on 05/16/2024 by Jerrell Cleatus Ned, MD.  Called Nurse Triage reporting Headache.  Symptoms began yesterday.  Interventions attempted: Rest, hydration, or home remedies.  Symptoms are: gradually worsening.  Triage Disposition: See PCP When Office is Open (Within 3 Days)  Patient/caregiver understands and will follow disposition?: Yes Copied from CRM [id number]. Topic: Clinical - Red Word Triage >> Jul 07, 2024  8:25 AM Gennette ORN wrote: Red Word that prompted transfer to Nurse Triage: Patient is calling in about the severe headaches he felt bad last night and he was shaking. Reason for Disposition  [1] New-onset headache AND [2] age > 50 years  Answer Assessment - Initial Assessment Questions 1. LOCATION: Where does it hurt?     On top of head and forehear  2. ONSET: When did the headache start? (e.g., minutes, hours, days)      No headache this morning, but had headache last night  3. PATTERN: Does the pain come and go, or has it been constant since it started?     Comes and goes  4. SEVERITY: How bad is the pain? and What does it keep you from doing?  (e.g., Scale 1-10; mild, moderate, or severe)     Moderate  5. RECURRENT SYMPTOM: Have you ever had headaches before? If Yes, ask: When was the last time? and What happened that time?      Yes, the last time he went ED (7/31) and given meds: compazine  and benadryl   via IV  6. CAUSE: What do you think is causing the headache?     Unsure, but has been under a lot of stree  7. MIGRAINE: Have you been diagnosed with migraine headaches? If Yes, ask: Is this headache similar?      Has not been officially diagnosed with migraines  8. HEAD INJURY: Has there been any recent injury to your head?      No  9. OTHER SYMPTOMS: Do you have any other symptoms? (e.g., fever, stiff neck, eye pain, sore throat, cold  symptoms)     Shaking last night as well,  Protocols used: Headache-A-AH

## 2024-07-07 NOTE — Telephone Encounter (Signed)
 Called to relay message from Dr Levora about ED precautions

## 2024-07-07 NOTE — Telephone Encounter (Signed)
 FYI patient has appointment to be seen by Dr Levora 07/08/2024

## 2024-07-07 NOTE — Telephone Encounter (Signed)
 Noted.  If he has return of shaking, or severe headache should be seen in ER.

## 2024-07-08 ENCOUNTER — Emergency Department (HOSPITAL_COMMUNITY)
Admission: EM | Admit: 2024-07-08 | Discharge: 2024-07-08 | Disposition: A | Discharge status: Home / Self Care | Attending: Emergency Medicine | Admitting: Emergency Medicine

## 2024-07-08 ENCOUNTER — Ambulatory Visit: Admitting: Family Medicine

## 2024-07-08 ENCOUNTER — Emergency Department (HOSPITAL_COMMUNITY)

## 2024-07-08 ENCOUNTER — Other Ambulatory Visit: Payer: Self-pay

## 2024-07-08 ENCOUNTER — Encounter (HOSPITAL_COMMUNITY): Payer: Self-pay

## 2024-07-08 DIAGNOSIS — R531 Weakness: Secondary | ICD-10-CM | POA: Insufficient documentation

## 2024-07-08 DIAGNOSIS — R519 Headache, unspecified: Secondary | ICD-10-CM | POA: Diagnosis not present

## 2024-07-08 DIAGNOSIS — R42 Dizziness and giddiness: Secondary | ICD-10-CM | POA: Diagnosis not present

## 2024-07-08 DIAGNOSIS — W19XXXA Unspecified fall, initial encounter: Secondary | ICD-10-CM | POA: Diagnosis not present

## 2024-07-08 DIAGNOSIS — R9431 Abnormal electrocardiogram [ECG] [EKG]: Secondary | ICD-10-CM | POA: Diagnosis not present

## 2024-07-08 DIAGNOSIS — I771 Stricture of artery: Secondary | ICD-10-CM | POA: Diagnosis not present

## 2024-07-08 DIAGNOSIS — W109XXA Fall (on) (from) unspecified stairs and steps, initial encounter: Secondary | ICD-10-CM | POA: Diagnosis not present

## 2024-07-08 DIAGNOSIS — G4489 Other headache syndrome: Secondary | ICD-10-CM | POA: Diagnosis not present

## 2024-07-08 DIAGNOSIS — Z981 Arthrodesis status: Secondary | ICD-10-CM | POA: Diagnosis not present

## 2024-07-08 DIAGNOSIS — R270 Ataxia, unspecified: Secondary | ICD-10-CM | POA: Diagnosis not present

## 2024-07-08 DIAGNOSIS — R4701 Aphasia: Secondary | ICD-10-CM | POA: Insufficient documentation

## 2024-07-08 DIAGNOSIS — R197 Diarrhea, unspecified: Secondary | ICD-10-CM | POA: Diagnosis not present

## 2024-07-08 LAB — COMPREHENSIVE METABOLIC PANEL WITH GFR
ALT: 48 U/L — ABNORMAL HIGH (ref 0–44)
AST: 44 U/L — ABNORMAL HIGH (ref 15–41)
Albumin: 5.2 g/dL — ABNORMAL HIGH (ref 3.5–5.0)
Alkaline Phosphatase: 90 U/L (ref 38–126)
Anion gap: 13 (ref 5–15)
BUN: 12 mg/dL (ref 6–20)
CO2: 26 mmol/L (ref 22–32)
Calcium: 9.7 mg/dL (ref 8.9–10.3)
Chloride: 98 mmol/L (ref 98–111)
Creatinine, Ser: 0.82 mg/dL (ref 0.61–1.24)
GFR, Estimated: >60 mL/min (ref >60)
Glucose, Bld: 106 mg/dL — ABNORMAL HIGH (ref 70–99)
Potassium: 3.6 mmol/L (ref 3.5–5.1)
Sodium: 137 mmol/L (ref 135–145)
Total Bilirubin: 1.1 mg/dL (ref 0.0–1.2)
Total Protein: 8.7 g/dL — ABNORMAL HIGH (ref 6.5–8.1)

## 2024-07-08 LAB — CBC
HCT: 49.2 % (ref 39.0–52.0)
Hemoglobin: 16.8 g/dL (ref 13.0–17.0)
MCH: 28.9 pg (ref 26.0–34.0)
MCHC: 34.1 g/dL (ref 30.0–36.0)
MCV: 84.5 fL (ref 80.0–100.0)
Platelets: 161 K/uL (ref 150–400)
RBC: 5.82 MIL/uL — ABNORMAL HIGH (ref 4.22–5.81)
RDW: 14.9 % (ref 11.5–15.5)
WBC: 8.8 K/uL (ref 4.0–10.5)
nRBC: 0 % (ref 0.0–0.2)

## 2024-07-08 LAB — APTT: aPTT: 36 s (ref 24–36)

## 2024-07-08 LAB — I-STAT CHEM 8, ED
BUN: 12 mg/dL (ref 6–20)
Calcium, Ion: 1.18 mmol/L (ref 1.15–1.40)
Chloride: 100 mmol/L (ref 98–111)
Creatinine, Ser: 0.9 mg/dL (ref 0.61–1.24)
Glucose, Bld: 94 mg/dL (ref 70–99)
HCT: 44 % (ref 39.0–52.0)
Hemoglobin: 15 g/dL (ref 13.0–17.0)
Potassium: 4.1 mmol/L (ref 3.5–5.1)
Sodium: 137 mmol/L (ref 135–145)
TCO2: 28 mmol/L (ref 22–32)

## 2024-07-08 LAB — CBG MONITORING, ED: Glucose-Capillary: 137 mg/dL — ABNORMAL HIGH (ref 70–99)

## 2024-07-08 LAB — RAPID URINE DRUG SCREEN, HOSP PERFORMED
Amphetamines: NOT DETECTED
Barbiturates: NOT DETECTED
Benzodiazepines: NOT DETECTED
Cocaine: NOT DETECTED
Opiates: NOT DETECTED
Tetrahydrocannabinol: NOT DETECTED

## 2024-07-08 LAB — DIFFERENTIAL
Abs Immature Granulocytes: 0.02 K/uL (ref 0.00–0.07)
Basophils Absolute: 0 K/uL (ref 0.0–0.1)
Basophils Relative: 1 %
Eosinophils Absolute: 0.2 K/uL (ref 0.0–0.5)
Eosinophils Relative: 2 %
Immature Granulocytes: 0 %
Lymphocytes Relative: 17 %
Lymphs Abs: 1.5 K/uL (ref 0.7–4.0)
Monocytes Absolute: 0.4 K/uL (ref 0.1–1.0)
Monocytes Relative: 4 %
Neutro Abs: 6.6 K/uL (ref 1.7–7.7)
Neutrophils Relative %: 76 %

## 2024-07-08 LAB — I-STAT CG4 LACTIC ACID, ED: Lactic Acid, Venous: 1.1 mmol/L (ref 0.5–1.9)

## 2024-07-08 LAB — ETHANOL: Alcohol, Ethyl (B): <15 mg/dL (ref <15)

## 2024-07-08 LAB — PROTIME-INR
INR: 0.9 (ref 0.8–1.2)
Prothrombin Time: 13 s (ref 11.4–15.2)

## 2024-07-08 MED ORDER — METOCLOPRAMIDE HCL 5 MG/ML IJ SOLN
10.0000 mg | Freq: Once | INTRAMUSCULAR | Status: AC
  Administered 2024-07-08: 10 mg via INTRAVENOUS
  Filled 2024-07-08: qty 2

## 2024-07-08 MED ORDER — PROCHLORPERAZINE EDISYLATE 10 MG/2ML IJ SOLN
10.0000 mg | Freq: Once | INTRAMUSCULAR | Status: AC
  Administered 2024-07-08: 10 mg via INTRAVENOUS
  Filled 2024-07-08: qty 2

## 2024-07-08 MED ORDER — IOHEXOL 350 MG/ML SOLN
75.0000 mL | Freq: Once | INTRAVENOUS | Status: AC | PRN
  Administered 2024-07-08: 75 mL via INTRAVENOUS

## 2024-07-08 MED ORDER — KETOROLAC TROMETHAMINE 15 MG/ML IJ SOLN
15.0000 mg | Freq: Once | INTRAMUSCULAR | Status: AC
  Administered 2024-07-08: 15 mg via INTRAVENOUS
  Filled 2024-07-08: qty 1

## 2024-07-08 NOTE — ED Triage Notes (Signed)
 Pt brought in by EMS from home status post fall tonight when getting out of bed to go to the bathroom. Pt reports feeling dizzy which is what led to the fall around 1am. Denies hitting head, (-) Loc, (-) thinners. Ems reports intermittent slurred speech with an otherwise negative stroke screen. No unilateral deficits noted, no obvious facial droop, no confusion. Pt describes dizziness as room spinning. Dizziness ongoing since Tuesday same with intermittent slurred speech. Multiple falls during that time (denies hitting head or losing consciousness with any of them). States legs felt week bilaterally yesterday. Frontal localized headache reported at this time. Right knee pain and left foot bruising reported.  Chronic diarrhea (months). Poor PO oral intake since Sunday. Migraine reported all day Tuesday with no history of migraines.

## 2024-07-08 NOTE — ED Provider Notes (Signed)
 [place] EMERGENCY DEPARTMENT AT [facility] Provider Note   CSN: [id number] Arrival date & time: 07/08/24  9793     Patient presents with: Dizziness, Fall, and Aphasia   VIPUL CAFARELLI is a 53 y.o. male.   The history is provided by the patient.  Dizziness Fall  KIAAN OVERHOLSER is a 53 y.o. male who presents to the Emergency Department complaining of headache, weakness.  He presents to the emergency department by EMS for evaluation of multiple falls, headache.  He states that he started feeling poorly on Tuesday with what he describes as a migraine headache.  He fell down 4 stairs early Wednesday morning.  He was able to get himself back up and does not believe he hit his head.  Tonight he fell again getting out of the bed.  He reports feeling dizzy, has some nauseousness.  Overall his headache is improving but he does continue to have a frontal headache.  He has a history of migraine.  He has a remote history of seizures, no longer on medications.  He does take gabapentin  3 times daily.  He uses tobacco.  No alcohol or drug use.     Prior to Admission medications   Medication Sig Start Date End Date Taking? Authorizing Provider  albuterol  (VENTOLIN  HFA) 108 (90 Base) MCG/ACT inhaler Inhale 1-2 puffs into the lungs every 6 (six) hours as needed for wheezing or shortness of breath. 07/18/22   Thedora Garnette CHRISTELLA, MD  amitriptyline  (ELAVIL ) 10 MG tablet TAKE 1 TABLET BEFORE BEDTIME FOR 2 WEEKS THEN INCREASE TO 2 TABLETS (20 MG) AT BEDTIME 03/23/24   Stacia Glendia BRAVO, MD  ascorbic acid  (VITAMIN C ) 1000 MG tablet Take 1 tablet (1,000 mg total) by mouth daily. 03/27/23   Deward Eck, PA-C  celecoxib  (CELEBREX ) 200 MG capsule Take 1 capsule (200 mg total) by mouth 2 (two) times daily as needed. 05/12/24   Silver Wonda LABOR, PA  cephALEXin  (KEFLEX ) 500 MG capsule Take 1 capsule (500 mg total) by mouth 4 (four) times daily. 04/20/24   Geroldine Berg, MD  cetirizine  (ZYRTEC ) 10 MG tablet  TAKE 1 TABLET BY MOUTH EVERY DAY 06/17/24   Jerrell Cleatus Ned, MD  cyclobenzaprine  (FLEXERIL ) 10 MG tablet Take 1 tablet (10 mg total) by mouth 2 (two) times daily as needed for muscle spasms. 05/12/24   Silver Wonda A, PA  dicyclomine  (BENTYL ) 20 MG tablet TAKE 1 TABLET (20 MG TOTAL) BY MOUTH 2 (TWO) TIMES DAILY AS NEEDED FOR SPASMS. 05/30/24   Stacia Glendia BRAVO, MD  DULoxetine  (CYMBALTA ) 30 MG capsule Take 1 capsule (30 mg total) by mouth daily. 03/01/24   Jerrell Cleatus Ned, MD  fluticasone  (FLONASE ) 50 MCG/ACT nasal spray SPRAY 2 SPRAYS INTO EACH NOSTRIL EVERY DAY (ONLY COVERED AT [facility]) 06/17/24   Jerrell Cleatus Ned, MD  gabapentin  (NEURONTIN ) 300 MG capsule TAKE 1 CAPSULE BY MOUTH THREE TIMES A DAY 05/30/24   Jerrell Cleatus Ned, MD  HYDROcodone -acetaminophen  (NORCO/VICODIN) 5-325 MG tablet Take 1 tablet by mouth every 6 (six) hours as needed. 04/24/24   Ruthell Lonni FALCON, PA-C  lidocaine  (LIDODERM ) 5 % Place 1 patch onto the skin daily. Remove & Discard patch within 12 hours or as directed by MD 05/12/24   Silver Wonda LABOR, PA  methocarbamol  (ROBAXIN ) 750 MG tablet TAKE 1 TABLET (750 MG TOTAL) BY MOUTH DAILY AS NEEDED FOR MUSCLE SPASMS 06/17/24   Jerrell Cleatus Ned, MD  omeprazole  (PRILOSEC) 20 MG capsule Take 1  capsule (20 mg total) by mouth daily. 06/23/24   Horton, Charmaine FALCON, MD  ondansetron  (ZOFRAN ) 4 MG tablet Take 1 tablet (4 mg total) by mouth every 6 (six) hours. 04/01/24   Robinson, John K, PA-C  ondansetron  (ZOFRAN -ODT) 4 MG disintegrating tablet Take 1 tablet (4 mg total) by mouth every 8 (eight) hours as needed. 12/11/23   Silver Wonda LABOR, PA  pantoprazole  (PROTONIX ) 40 MG tablet Take 1 tablet (40 mg total) by mouth daily. 03/01/24 02/24/25  Stacia Glendia BRAVO, MD  tamsulosin  (FLOMAX ) 0.4 MG CAPS capsule Take 1 capsule (0.4 mg total) by mouth daily after breakfast. 11/04/23   Bernis Ernst, PA-C    Allergies: Hydromorphone     Review of Systems  Neurological:   Positive for dizziness.  All other systems reviewed and are negative.   Updated Vital Signs BP (!) 131/114   Pulse 66   Temp 98.4 F (36.9 C) (Oral)   Resp 17   Ht 6' 2 (1.88 m)   Wt 103 kg   SpO2 99%   BMI 29.15 kg/m   Physical Exam Vitals and nursing note reviewed.  Constitutional:      Appearance: He is well-developed.  HENT:     Head: Normocephalic and atraumatic.  Cardiovascular:     Rate and Rhythm: Normal rate and regular rhythm.     Heart sounds: No murmur heard. Pulmonary:     Effort: Pulmonary effort is normal. No respiratory distress.     Breath sounds: Normal breath sounds.  Abdominal:     Palpations: Abdomen is soft.     Tenderness: There is no abdominal tenderness. There is no guarding or rebound.  Musculoskeletal:        General: No tenderness.  Skin:    General: Skin is warm and dry.  Neurological:     Mental Status: He is alert and oriented to person, place, and time.     Comments: No asymmetry of facial movements.  Visual fields grossly intact.  Mild global weakness.  Mild dysarthria.  Psychiatric:        Behavior: Behavior normal.     (all labs ordered are listed, but only abnormal results are displayed) Labs Reviewed  CBC - Abnormal; Notable for the following components:      Result Value   RBC 5.82 (*)    All other components within normal limits  COMPREHENSIVE METABOLIC PANEL WITH GFR - Abnormal; Notable for the following components:   Glucose, Bld 106 (*)    Total Protein 8.7 (*)    Albumin 5.2 (*)    AST 44 (*)    ALT 48 (*)    All other components within normal limits  CBG MONITORING, ED - Abnormal; Notable for the following components:   Glucose-Capillary 137 (*)    All other components within normal limits  ETHANOL  PROTIME-INR  APTT  DIFFERENTIAL  RAPID URINE DRUG SCREEN, HOSP PERFORMED  I-STAT CHEM 8, ED  I-STAT CG4 LACTIC ACID, ED    EKG: EKG Interpretation Date/Time:  Friday July 08 2024 02:34:10 EDT Ventricular  Rate:  79 PR Interval:  154 QRS Duration:  77 QT Interval:  371 QTC Calculation: 426 R Axis:   76  Text Interpretation: Sinus rhythm Confirmed by Griselda Norris [phone]) on 07/08/2024 2:45:18 AM  Radiology: CT Angio Head Neck W WO CM Result Date: 07/08/2024 CLINICAL DATA:  53 year old male with headache, ataxia, speech changes, dizziness. Fall 0100 hours. EXAM: CT ANGIOGRAPHY HEAD AND NECK WITH AND WITHOUT CONTRAST  TECHNIQUE: Multidetector CT imaging of the head and neck was performed using the standard protocol during bolus administration of intravenous contrast. Multiplanar CT image reconstructions and MIPs were obtained to evaluate the vascular anatomy. Carotid stenosis measurements (when applicable) are obtained utilizing NASCET criteria, using the distal internal carotid diameter as the denominator. RADIATION DOSE REDUCTION: This exam was performed according to the departmental dose-optimization program which includes automated exposure control, adjustment of the mA and/or kV according to patient size and/or use of iterative reconstruction technique. CONTRAST:  75mL OMNIPAQUE  IOHEXOL  350 MG/ML SOLN COMPARISON:  Brain MRI 11/13/2020. Head CT and CTA head and neck 11/26/2023. FINDINGS: CT HEAD Brain: Cerebral volume is within normal limits for age. No midline shift, ventriculomegaly, mass effect, evidence of mass lesion, intracranial hemorrhage or evidence of cortically based acute infarction. Gray-white matter differentiation is within normal limits throughout the brain. Calvarium and skull base: Stable and intact. Paranasal sinuses: Tympanic cavities, Visualized paranasal sinuses and mastoids are clear. Orbits: No acute orbit or scalp soft tissue injury identified. CTA NECK Skeleton: Stable C3-C4 ACDF. Underlying chronic cervical spine degeneration including bulky endplate spurring there. No evidence of hardware loosening. But no convincing solid arthrodesis. No acute osseous abnormality identified.  Upper chest: Minor atelectasis. Other neck: Necklace artifact. Nonvascular neck soft tissue spaces are within normal limits. Aortic arch: Stable and -3 vessel arch. Right carotid system: Tortuous brachiocephalic artery and right CCA origin. Patent right carotid bifurcation. Mildly tortuous cervical right ICA. No significant plaque. No stenosis. Left carotid system: Patent with mild tortuosity. No stenosis or significant plaque. Vertebral arteries: Tortuous proximal right subclavian artery. Normal right vertebral artery origin. Right vertebral artery patent and normal to the skull base. Minimal proximal left subclavian artery plaque without stenosis. Normal left vertebral artery origin. Mildly dominant, mildly tortuous left vertebral artery is patent and normal to the skull base. CTA HEAD Posterior circulation: Distal vertebral arteries, vertebrobasilar junction, PICA origins are patent and normal. Dominant left V4 segment. Patent basilar artery without stenosis. Normal SCA and PCA origins. Posterior communicating arteries are diminutive or absent. Bilateral PCA branches are stable and within normal limits. Anterior circulation: Both ICA siphons are patent. No siphon stenosis or significant plaque. Patent carotid termini. Normal MCA and ACA origins. Diminutive anterior communicating artery. Bilateral ACA branches are within normal limits. MCA M1 segments, bifurcations, bilateral MCA branches are stable and within normal limits. Venous sinuses: Early contrast timing, grossly patent. Anatomic variants: Mildly dominant left vertebral artery. Review of the MIP images confirms the above findings IMPRESSION: 1. CTA Head and Neck is negative aside from mild arterial tortuosity. No stenosis. No significant atherosclerosis. 2. Stable and normal for age CT appearance of the brain. 3. C3-C4 ACDF with suspected pseudoarthrosis. Electronically Signed   By: VEAR Hurst M.D.   On: 07/08/2024 05:24     Procedures   Medications  Ordered in the ED  metoCLOPramide  (REGLAN ) injection 10 mg (10 mg Intravenous Given 07/08/24 0249)  iohexol  (OMNIPAQUE ) 350 MG/ML injection 75 mL (75 mLs Intravenous Contrast Given 07/08/24 0420)  prochlorperazine  (COMPAZINE ) injection 10 mg (10 mg Intravenous Given 07/08/24 0616)  ketorolac  (TORADOL ) 15 MG/ML injection 15 mg (15 mg Intravenous Given 07/08/24 9356)                                    Medical Decision Making Amount and/or Complexity of Data Reviewed Radiology: ordered.  Risk Prescription drug management.  Patient here for evaluation of headache, frequent falls, dizziness. No focal deficits on examination. He does have mild global weakness. CTA head was negative for acute abnormality. He does have ongoing headache on reevaluation but is improving. Patient care transferred pending reevaluation. Current picture is not consistent with subarachnoid hemorrhage, meningitis.      Final diagnoses:  Bad headache    ED Discharge Orders     None          Griselda Norris, MD 07/08/24 [phone]

## 2024-07-08 NOTE — ED Notes (Signed)
 Unknown LKW.

## 2024-07-08 NOTE — Discharge Instructions (Signed)
 Return for any problem.  ?

## 2024-07-08 NOTE — ED Notes (Signed)
 Patient given water r/t unable to urinate.

## 2024-07-08 NOTE — ED Notes (Signed)
 RN ambulated patient in hallway patient ambulated well denied SOB or dizziness

## 2024-07-08 NOTE — ED Provider Notes (Signed)
 Patient seen after prior EDP.  Patient is improved.  He desires discharge home.  He declines additional observation and/or treatment.  Importance of close follow-up is stressed.  Strict return precautions given understood.   Laurice Maude BROCKS, MD 07/08/24 [phone]

## 2024-07-08 NOTE — ED Notes (Signed)
 Urinal provided to patient, instructed to call when completed.

## 2024-07-11 ENCOUNTER — Emergency Department (HOSPITAL_COMMUNITY)

## 2024-07-11 ENCOUNTER — Emergency Department (HOSPITAL_BASED_OUTPATIENT_CLINIC_OR_DEPARTMENT_OTHER)
Admission: EM | Admit: 2024-07-11 | Discharge: 2024-07-11 | Disposition: A | Discharge status: Home / Self Care | Attending: Emergency Medicine | Admitting: Emergency Medicine

## 2024-07-11 ENCOUNTER — Encounter (HOSPITAL_BASED_OUTPATIENT_CLINIC_OR_DEPARTMENT_OTHER): Payer: Self-pay

## 2024-07-11 ENCOUNTER — Emergency Department (HOSPITAL_BASED_OUTPATIENT_CLINIC_OR_DEPARTMENT_OTHER)

## 2024-07-11 ENCOUNTER — Other Ambulatory Visit: Payer: Self-pay

## 2024-07-11 DIAGNOSIS — F17200 Nicotine dependence, unspecified, uncomplicated: Secondary | ICD-10-CM | POA: Insufficient documentation

## 2024-07-11 DIAGNOSIS — S92512D Displaced fracture of proximal phalanx of left lesser toe(s), subsequent encounter for fracture with routine healing: Secondary | ICD-10-CM | POA: Diagnosis not present

## 2024-07-11 DIAGNOSIS — W01198A Fall on same level from slipping, tripping and stumbling with subsequent striking against other object, initial encounter: Secondary | ICD-10-CM | POA: Insufficient documentation

## 2024-07-11 DIAGNOSIS — R000 Tachycardia, unspecified: Secondary | ICD-10-CM | POA: Diagnosis not present

## 2024-07-11 DIAGNOSIS — S99922A Unspecified injury of left foot, initial encounter: Secondary | ICD-10-CM | POA: Diagnosis present

## 2024-07-11 DIAGNOSIS — R799 Abnormal finding of blood chemistry, unspecified: Secondary | ICD-10-CM | POA: Insufficient documentation

## 2024-07-11 DIAGNOSIS — R4182 Altered mental status, unspecified: Secondary | ICD-10-CM | POA: Insufficient documentation

## 2024-07-11 DIAGNOSIS — J45909 Unspecified asthma, uncomplicated: Secondary | ICD-10-CM | POA: Diagnosis not present

## 2024-07-11 DIAGNOSIS — S92255A Nondisplaced fracture of navicular [scaphoid] of left foot, initial encounter for closed fracture: Secondary | ICD-10-CM | POA: Diagnosis not present

## 2024-07-11 DIAGNOSIS — S0990XA Unspecified injury of head, initial encounter: Secondary | ICD-10-CM | POA: Diagnosis not present

## 2024-07-11 DIAGNOSIS — W19XXXA Unspecified fall, initial encounter: Secondary | ICD-10-CM | POA: Diagnosis not present

## 2024-07-11 DIAGNOSIS — R42 Dizziness and giddiness: Secondary | ICD-10-CM | POA: Diagnosis not present

## 2024-07-11 DIAGNOSIS — T07XXXA Unspecified multiple injuries, initial encounter: Secondary | ICD-10-CM | POA: Diagnosis not present

## 2024-07-11 LAB — COMPREHENSIVE METABOLIC PANEL WITH GFR
ALT: 52 U/L — ABNORMAL HIGH (ref 0–44)
AST: 65 U/L — ABNORMAL HIGH (ref 15–41)
Albumin: 5 g/dL (ref 3.5–5.0)
Alkaline Phosphatase: 95 U/L (ref 38–126)
Anion gap: 14 (ref 5–15)
BUN: 10 mg/dL (ref 6–20)
CO2: 25 mmol/L (ref 22–32)
Calcium: 10.5 mg/dL — ABNORMAL HIGH (ref 8.9–10.3)
Chloride: 101 mmol/L (ref 98–111)
Creatinine, Ser: 0.95 mg/dL (ref 0.61–1.24)
GFR, Estimated: >60 mL/min (ref >60)
Glucose, Bld: 93 mg/dL (ref 70–99)
Potassium: 3.9 mmol/L (ref 3.5–5.1)
Sodium: 139 mmol/L (ref 135–145)
Total Bilirubin: 0.4 mg/dL (ref 0.0–1.2)
Total Protein: 7.8 g/dL (ref 6.5–8.1)

## 2024-07-11 LAB — CBC WITH DIFFERENTIAL/PLATELET
Abs Immature Granulocytes: 0.02 K/uL (ref 0.00–0.07)
Basophils Absolute: 0 K/uL (ref 0.0–0.1)
Basophils Relative: 0 %
Eosinophils Absolute: 0.3 K/uL (ref 0.0–0.5)
Eosinophils Relative: 4 %
HCT: 44.9 % (ref 39.0–52.0)
Hemoglobin: 15.7 g/dL (ref 13.0–17.0)
Immature Granulocytes: 0 %
Lymphocytes Relative: 21 %
Lymphs Abs: 1.6 K/uL (ref 0.7–4.0)
MCH: 29.4 pg (ref 26.0–34.0)
MCHC: 35 g/dL (ref 30.0–36.0)
MCV: 84.1 fL (ref 80.0–100.0)
Monocytes Absolute: 0.4 K/uL (ref 0.1–1.0)
Monocytes Relative: 6 %
Neutro Abs: 5.4 K/uL (ref 1.7–7.7)
Neutrophils Relative %: 69 %
Platelets: 131 K/uL — ABNORMAL LOW (ref 150–400)
RBC: 5.34 MIL/uL (ref 4.22–5.81)
RDW: 15 % (ref 11.5–15.5)
WBC: 7.8 K/uL (ref 4.0–10.5)
nRBC: 0 % (ref 0.0–0.2)

## 2024-07-11 LAB — AMMONIA: Ammonia: <13 umol/L (ref 9–35)

## 2024-07-11 LAB — URINALYSIS, ROUTINE W REFLEX MICROSCOPIC
Bacteria, UA: NONE SEEN
Bilirubin Urine: NEGATIVE
Glucose, UA: 500 mg/dL — AB
Hgb urine dipstick: NEGATIVE
Ketones, ur: NEGATIVE mg/dL
Leukocytes,Ua: NEGATIVE
Nitrite: NEGATIVE
Specific Gravity, Urine: 1.036 — ABNORMAL HIGH (ref 1.005–1.030)
pH: 5.5 (ref 5.0–8.0)

## 2024-07-11 LAB — ETHANOL: Alcohol, Ethyl (B): <15 mg/dL (ref <15)

## 2024-07-11 LAB — CBG MONITORING, ED: Glucose-Capillary: 90 mg/dL (ref 70–99)

## 2024-07-11 LAB — FOLATE: Folate: 8.5 ng/mL (ref >5.9)

## 2024-07-11 LAB — URINE DRUG SCREEN
Amphetamines: NOT DETECTED
Barbiturates: NOT DETECTED
Benzodiazepines: NOT DETECTED
Cocaine: NOT DETECTED
Fentanyl: NOT DETECTED
Methadone Scn, Ur: NOT DETECTED
Opiates: NOT DETECTED
Tetrahydrocannabinol: NOT DETECTED

## 2024-07-11 LAB — TSH: TSH: 1.67 u[IU]/mL (ref 0.350–4.500)

## 2024-07-11 LAB — VITAMIN B12: Vitamin B-12: 674 pg/mL (ref 180–914)

## 2024-07-11 MED ORDER — FENTANYL CITRATE PF 50 MCG/ML IJ SOSY
50.0000 ug | PREFILLED_SYRINGE | Freq: Once | INTRAMUSCULAR | Status: AC
  Administered 2024-07-11: 50 ug via INTRAVENOUS
  Filled 2024-07-11: qty 1

## 2024-07-11 MED ORDER — THIAMINE HCL 100 MG/ML IJ SOLN
100.0000 mg | Freq: Once | INTRAMUSCULAR | Status: AC
  Administered 2024-07-11: 100 mg via INTRAVENOUS
  Filled 2024-07-11: qty 2

## 2024-07-11 NOTE — Discharge Instructions (Addendum)
 Follow-up closely with your primary doctor and orthopedics.  Reviewed medications with your physician as this may be contributing to your symptoms.  Return for new concerns.  Work note provided.

## 2024-07-11 NOTE — ED Provider Notes (Signed)
 Patient sent over from [facility] room for MRI.  MRI results independently reviewed no acute or new findings.  On reassessment patient neurologically doing well no deficits, answers all questions appropriately.  Discussed importance of follow-up with primary doctor to review medications and any further workup that is needed.  Discussed follow-up with orthopedics for his fracture he has a boot in place.  Work note provided.   Tonia Chew, MD 07/11/24 [phone]

## 2024-07-11 NOTE — ED Notes (Signed)
Dr Wilkie Aye at bedside to evaluate pt

## 2024-07-11 NOTE — ED Triage Notes (Signed)
 Brought in by EMS after falling when standing up. Hx of vertigo. Altered in triage. Hit head on wall during fall, no thinners. L foot pain. Pupils equal and reactive. Denies ETOH or drugs.

## 2024-07-11 NOTE — ED Notes (Signed)
 Patient transported to MRI

## 2024-07-11 NOTE — ED Provider Notes (Signed)
 [place] EMERGENCY DEPARTMENT AT [facility] Provider Note   CSN: [id number] Arrival date & time: 07/11/24  9682     Patient presents with: No chief complaint on file.   Alan Sanders is a 53 y.o. male.   HPI     This is a 53 year old male who presents with altered mental status and a fall.  Patient has been seen and evaluated multiple times here recently for the same.  Called EMS after fall.  Reports that he tripped and fell.  Patient is somnolent upon arrival.  Reports taking a Tylenol  PM prior to going to bed which is normal for him.  Denies any alcohol or drug use.  Denies any new medications.  Patient last seen and evaluated with a similar presentation on 8/15.  At that time CTA of the head was negative and workup was reassuring.  Patient denies any recent illnesses.  Has a history of migraines and vertigo.  Prior to Admission medications   Medication Sig Start Date End Date Taking? Authorizing Provider  albuterol  (VENTOLIN  HFA) 108 (90 Base) MCG/ACT inhaler Inhale 1-2 puffs into the lungs every 6 (six) hours as needed for wheezing or shortness of breath. 07/18/22   Thedora Garnette CHRISTELLA, MD  amitriptyline  (ELAVIL ) 10 MG tablet TAKE 1 TABLET BEFORE BEDTIME FOR 2 WEEKS THEN INCREASE TO 2 TABLETS (20 MG) AT BEDTIME 03/23/24   Stacia Glendia BRAVO, MD  ascorbic acid  (VITAMIN C ) 1000 MG tablet Take 1 tablet (1,000 mg total) by mouth daily. 03/27/23   Deward Eck, PA-C  celecoxib  (CELEBREX ) 200 MG capsule Take 1 capsule (200 mg total) by mouth 2 (two) times daily as needed. 05/12/24   Silver Wonda LABOR, PA  cephALEXin  (KEFLEX ) 500 MG capsule Take 1 capsule (500 mg total) by mouth 4 (four) times daily. 04/20/24   Geroldine Berg, MD  cetirizine  (ZYRTEC ) 10 MG tablet TAKE 1 TABLET BY MOUTH EVERY DAY 06/17/24   Jerrell Cleatus Ned, MD  cyclobenzaprine  (FLEXERIL ) 10 MG tablet Take 1 tablet (10 mg total) by mouth 2 (two) times daily as needed for muscle spasms. 05/12/24   Silver Wonda A, PA   dicyclomine  (BENTYL ) 20 MG tablet TAKE 1 TABLET (20 MG TOTAL) BY MOUTH 2 (TWO) TIMES DAILY AS NEEDED FOR SPASMS. 05/30/24   Stacia Glendia BRAVO, MD  DULoxetine  (CYMBALTA ) 30 MG capsule Take 1 capsule (30 mg total) by mouth daily. 03/01/24   Jerrell Cleatus Ned, MD  fluticasone  (FLONASE ) 50 MCG/ACT nasal spray SPRAY 2 SPRAYS INTO EACH NOSTRIL EVERY DAY (ONLY COVERED AT [facility]) 06/17/24   Jerrell Cleatus Ned, MD  gabapentin  (NEURONTIN ) 300 MG capsule TAKE 1 CAPSULE BY MOUTH THREE TIMES A DAY 05/30/24   Jerrell Cleatus Ned, MD  HYDROcodone -acetaminophen  (NORCO/VICODIN) 5-325 MG tablet Take 1 tablet by mouth every 6 (six) hours as needed. 04/24/24   Ruthell Lonni FALCON, PA-C  lidocaine  (LIDODERM ) 5 % Place 1 patch onto the skin daily. Remove & Discard patch within 12 hours or as directed by MD 05/12/24   Silver Wonda A, PA  methocarbamol  (ROBAXIN ) 750 MG tablet TAKE 1 TABLET (750 MG TOTAL) BY MOUTH DAILY AS NEEDED FOR MUSCLE SPASMS 06/17/24   Jerrell Cleatus Ned, MD  omeprazole  (PRILOSEC) 20 MG capsule Take 1 capsule (20 mg total) by mouth daily. 06/23/24   Shayanne Gomm, Charmaine FALCON, MD  ondansetron  (ZOFRAN ) 4 MG tablet Take 1 tablet (4 mg total) by mouth every 6 (six) hours. 04/01/24   Robinson, John K, PA-C  ondansetron  (ZOFRAN -ODT)  4 MG disintegrating tablet Take 1 tablet (4 mg total) by mouth every 8 (eight) hours as needed. 12/11/23   Silver Wonda LABOR, PA  pantoprazole  (PROTONIX ) 40 MG tablet Take 1 tablet (40 mg total) by mouth daily. 03/01/24 02/24/25  Stacia Glendia BRAVO, MD  tamsulosin  (FLOMAX ) 0.4 MG CAPS capsule Take 1 capsule (0.4 mg total) by mouth daily after breakfast. 11/04/23   Bernis Ernst, PA-C    Allergies: Hydromorphone     Review of Systems  Constitutional:  Negative for fever.  Respiratory:  Negative for shortness of breath.   Cardiovascular:  Negative for chest pain.  Gastrointestinal:  Negative for abdominal pain.  Neurological:  Positive for dizziness and headaches.  All  other systems reviewed and are negative.   Updated Vital Signs BP 125/78   Pulse 86   Temp 97.7 F (36.5 C) (Oral)   Resp 17   SpO2 95%   Physical Exam Vitals and nursing note reviewed.  Constitutional:      Appearance: He is well-developed. He is not ill-appearing.  HENT:     Head: Normocephalic and atraumatic.     Mouth/Throat:     Mouth: Mucous membranes are dry.  Eyes:     Pupils: Pupils are equal, round, and reactive to light.  Cardiovascular:     Rate and Rhythm: Normal rate and regular rhythm.     Heart sounds: Normal heart sounds. No murmur heard. Pulmonary:     Effort: Pulmonary effort is normal. No respiratory distress.     Breath sounds: Normal breath sounds. No wheezing.  Abdominal:     General: Bowel sounds are normal.     Palpations: Abdomen is soft.     Tenderness: There is no abdominal tenderness. There is no rebound.  Musculoskeletal:     Cervical back: Neck supple.     Comments: Tenderness to palpation left foot  Lymphadenopathy:     Cervical: No cervical adenopathy.  Skin:    General: Skin is warm and dry.  Neurological:     Mental Status: He is alert and oriented to person, place, and time.     Comments: Technically oriented, slow to answer and somnolent, 5 out of 5 strength in all 4 extremities, some tremulousness noted, some difficulty with's finger-nose-finger bilaterally     (all labs ordered are listed, but only abnormal results are displayed) Labs Reviewed  CBC WITH DIFFERENTIAL/PLATELET - Abnormal; Notable for the following components:      Result Value   Platelets 131 (*)    All other components within normal limits  COMPREHENSIVE METABOLIC PANEL WITH GFR - Abnormal; Notable for the following components:   Calcium  10.5 (*)    AST 65 (*)    ALT 52 (*)    All other components within normal limits  URINALYSIS, ROUTINE W REFLEX MICROSCOPIC - Abnormal; Notable for the following components:   Specific Gravity, Urine 1.036 (*)    Glucose,  UA 500 (*)    Protein, ur TRACE (*)    All other components within normal limits  ETHANOL  AMMONIA  URINE DRUG SCREEN  VITAMIN B12  FOLATE  VITAMIN B1  TSH  CBG MONITORING, ED    EKG: None  Radiology: DG Foot Complete Left Result Date: 07/11/2024 CLINICAL DATA:  53 year old male status post fall. Altered mental status. EXAM: LEFT FOOT - COMPLETE 3+ VIEW COMPARISON:  Left foot series 10/27/2019. FINDINGS: Three-view 0353 hours. Interval chronic appearing remodeling of the 5th proximal phalanx appears related to a chronic and  un healed oblique fracture there (image 2). Posttraumatic and/or degenerative sclerosis of that phalanx now. Maintained alignment in the left 5th toe. And no superimposed acute metatarsal or phalanx fracture or dislocation identified. Other joint spaces and alignment appear maintained. However, there is cortical irregularity of the dorsal navicular which is new since 2020 on the lateral view. And possible mild soft tissue swelling there. Calcaneus and other tarsal bones appear intact. Normal background bone mineralization. IMPRESSION: 1. Cortical irregularity and possible soft tissue swelling at the dorsal navicular, new since 2020. Consider nondisplaced navicular fracture and query point tenderness. 2. New since 2020 but chronic and un-healed fracture of the 5th proximal phalanx with sclerosis. 3. No other acute osseous abnormality identified in the left foot. Electronically Signed   By: VEAR Hurst M.D.   On: 07/11/2024 04:13   CT Head Wo Contrast Result Date: 07/11/2024 CLINICAL DATA:  Fall, hit head EXAM: CT HEAD WITHOUT CONTRAST TECHNIQUE: Contiguous axial images were obtained from the base of the skull through the vertex without intravenous contrast. RADIATION DOSE REDUCTION: This exam was performed according to the departmental dose-optimization program which includes automated exposure control, adjustment of the mA and/or kV according to patient size and/or use of  iterative reconstruction technique. COMPARISON:  None Available. FINDINGS: Brain: No acute intracranial abnormality. Specifically, no hemorrhage, hydrocephalus, mass lesion, acute infarction, or significant intracranial injury. Vascular: No hyperdense vessel or unexpected calcification. Skull: No acute calvarial abnormality. Sinuses/Orbits: No acute findings Other: None IMPRESSION: Normal study. Electronically Signed   By: Franky Crease M.D.   On: 07/11/2024 03:56     .Critical Care  Performed by: Bari Charmaine FALCON, MD Authorized by: Bari Charmaine FALCON, MD   Critical care provider statement:    Critical care time (minutes):  31   Critical care was necessary to treat or prevent imminent or life-threatening deterioration of the following conditions: Altered mental status requiring rechecks, consultation, and advanced testing.   Critical care was time spent personally by me on the following activities:  Development of treatment plan with patient or surrogate, discussions with consultants, evaluation of patient's response to treatment, examination of patient, ordering and review of laboratory studies, ordering and review of radiographic studies, ordering and performing treatments and interventions, pulse oximetry, re-evaluation of patient's condition and review of old charts    Medications Ordered in the ED  thiamine  (VITAMIN B1) injection 100 mg (100 mg Intravenous Given 07/11/24 0513)    Clinical Course as of 07/11/24 0517  Mon Jul 11, 2024  0502 Spoke to neurology, Dr. KhaliqdinA.  Have reviewed patient's chart and current presentation.  Does recommend MRI.  If negative, would suspect polypharmacy given his medication list.  However, also recommends getting B12, B1, folate levels, and TSH and starting on thiamine . [CH]  0516 Chart extensively reviewed.  It does appear that he had a TSH back in January that was elevated in the sixes.  I do not see where he has followed up with his PCP as  recommended. [CH]    Clinical Course User Index [CH] Kayleigh Broadwell, Charmaine FALCON, MD                                 Medical Decision Making Amount and/or Complexity of Data Reviewed Labs: ordered. Radiology: ordered.  Risk Prescription drug management.   This patient presents to the ED for concern of altered mental status, fall, this involves an extensive number of treatment  options, and is a complaint that carries with it a high risk of complications and morbidity.  I considered the following differential and admission for this acute, potentially life threatening condition.  The differential diagnosis includes stroke, encephalopathy, traumatic injury, head bleed, polypharmacy  MDM:    This is a 53 year old male who presents with a fall.  Altered.  Similar presentation just 2 days ago.  Has had 3 visits in the last 2 weeks involving headache, dizziness, altered mental status.  He is slow and somnolent.  Reports taking a Tylenol  p.m. but no other new medications.  He does have multiple medications that are sedating.  UDS and EtOH -2 days ago and repeat negative today.  No known history of alcohol abuse.  Ammonia level negative.  Labs are largely reassuring.  CT head today is negative.  He does have some evidence of a chip of the left navicular.  Patient placed in a boot and nonweightbearing.  Does not need emergent orthopedic consultation but will need follow-up.  See discussion above with neurology.  Have added vitamin levels and TSH to rule out myxedema coma.  Will transfer for [facility] to [facility] emergency department.  Dr. Melvenia accepting.  (Labs, imaging, consults)  Labs: I Ordered, and personally interpreted labs.  The pertinent results include: CBC, CMP, ammonia, EtOH, UDS, urinalysis  Imaging Studies ordered: I ordered imaging studies including CT head I independently visualized and interpreted imaging. I agree with the radiologist interpretation  Additional history obtained from chart  review.  External records from outside source obtained and reviewed including outpatient notes and recent ED visits  Cardiac Monitoring: The patient was maintained on a cardiac monitor.  If on the cardiac monitor, I personally viewed and interpreted the cardiac monitored which showed an underlying rhythm of: Sinus  Reevaluation: After the interventions noted above, I reevaluated the patient and found that they have :stayed the same  Social Determinants of Health:  lives independently  Disposition: Transfer for MRI and further evaluation.  Pending labs include B12, B1, folate, and TSH.  If discharged, will need crutches.  Patient advised of navicular avulsion and will be given orthopedic follow-up instructions.  Co morbidities that complicate the patient evaluation  Past Medical History:  Diagnosis Date   Anxiety    situational anxiety-on meds   Arthritis    neck/back/LEFT wrist   Asthma    PRN inhaler   Balance problem    Depression    on meds   Family history of colonic polyps 05/08/2022   Gait difficulty    GERD (gastroesophageal reflux disease)    OTC PRN meds   Headache    migraines   History of colonic polyps 05/08/2022   Inguinal hernia of left side without obstruction or gangrene    Peyronie's disease 02/27/2022   Pneumonia    Seasonal allergies    Seizures (HCC)     its been along time ,since my last seizure    Spleen enlarged    Testosterone  deficiency in male 03/26/2023   Thrombocytopenia (HCC)    Tobacco abuse      Medicines Meds ordered this encounter  Medications   thiamine  (VITAMIN B1) injection 100 mg    I have reviewed the patients home medicines and have made adjustments as needed  Problem List / ED Course: Problem List Items Addressed This Visit   None Visit Diagnoses       Altered mental status, unspecified altered mental status type    -  Primary  Closed nondisplaced fracture of navicular bone of left foot, initial encounter                     Final diagnoses:  Altered mental status, unspecified altered mental status type  Closed nondisplaced fracture of navicular bone of left foot, initial encounter    ED Discharge Orders     None          Kelyn Ponciano, Charmaine FALCON, MD 07/11/24 [phone]

## 2024-07-11 NOTE — ED Notes (Signed)
 [facility] called for transport.

## 2024-07-12 ENCOUNTER — Ambulatory Visit: Payer: Self-pay

## 2024-07-12 NOTE — Telephone Encounter (Signed)
 FYI - going to urgent care now

## 2024-07-12 NOTE — Telephone Encounter (Signed)
 Copied from CRM #[id number]. Topic: Clinical - Red Word Triage >> Jul 12, 2024  9:00 AM Mesmerise C wrote: [facility] that prompted transfer to Nurse Triage: Patient stated he hurt his left foot due to him falling can barely walk and swollen, has bruise on leg Answer Assessment - Initial Assessment Questions [name] a lot lately. ED told me it was vertigo. Foot is swollen and bruised.  I have ortho appt tomorrow but I'm in a lot of pain.     1. MECHANISM: How did the fall happen?     dizzy 2. DOMESTIC VIOLENCE AND ELDER ABUSE SCREENING: Did you fall because someone pushed you or tried to hurt you? If Yes, ask: Are you safe now?     Na  3. ONSET: When did the fall happen? (e.g., minutes, hours, or days ago)     Sunday 4. LOCATION: What part of the body hit the ground? (e.g., back, buttocks, head, hips, knees, hands, head, stomach)     Not sure 5. INJURY: Did you hurt (injure) yourself when you fell? If Yes, ask: What did you injure? Tell me more about this? (e.g., body area; type of injury; pain severity)     Left foot 6. PAIN: Is there any pain? If Yes, ask: How bad is the pain? (e.g., Scale 0-10; or none, mild,      8 7. SIZE: For cuts, bruises, or swelling, ask: How large is it? (e.g., inches or centimeters)      yes  9. OTHER SYMPTOMS: Do you have any other symptoms? (e.g., dizziness, fever, weakness; new-onset or worsening).      denies 10. CAUSE: What do you think caused the fall (or falling)? (e.g., dizzy spell, tripped)       dizzy  Protocols used: Falls and [facility]

## 2024-07-12 NOTE — Telephone Encounter (Signed)
 FYI Only or Action Required?: FYI only for provider.  Patient was last seen in primary care on 05/16/2024 by Jerrell Cleatus Ned, MD.  Called Nurse Triage reporting Fall.  Symptoms began today.  Interventions attempted: Nothing.  Symptoms are: gradually worsening.  Triage Disposition: See Physician Within 24 Hours  Patient/caregiver understands and will follow disposition?: Yes  Going to [facility]   Copied from CRM #[id number]. Topic: Clinical - Red Word Triage >> Jul 12, 2024  9:00 AM Mesmerise C wrote: [facility] that prompted transfer to Nurse Triage: Patient stated he hurt his left foot due to him falling can barely walk and swollen, has bruise on leg Answer Assessment - Initial Assessment Questions [name] a lot lately. ED told me it was vertigo. Foot is swollen and bruised.  I have ortho appt tomorrow but I'm in a lot of pain.     1. MECHANISM: How did the fall happen?     dizzy 2. DOMESTIC VIOLENCE AND ELDER ABUSE SCREENING: Did you fall because someone pushed you or tried to hurt you? If Yes, ask: Are you safe now?     Na  3. ONSET: When did the fall happen? (e.g., minutes, hours, or days ago)     Sunday 4. LOCATION: What part of the body hit the ground? (e.g., back, buttocks, head, hips, knees, hands, head, stomach)     Not sure 5. INJURY: Did you hurt (injure) yourself when you fell? If Yes, ask: What did you injure? Tell me more about this? (e.g., body area; type of injury; pain severity)     Left foot 6. PAIN: Is there any pain? If Yes, ask: How bad is the pain? (e.g., Scale 0-10; or none, mild,      8 7. SIZE: For cuts, bruises, or swelling, ask: How large is it? (e.g., inches or centimeters)      yes  9. OTHER SYMPTOMS: Do you have any other symptoms? (e.g., dizziness, fever, weakness; new-onset or worsening).      denies 10. CAUSE: What do you think caused the fall (or falling)? (e.g., dizzy spell, tripped)       dizzy  Protocols used:  Falls and [facility]

## 2024-07-13 DIAGNOSIS — S92212A Displaced fracture of cuboid bone of left foot, initial encounter for closed fracture: Secondary | ICD-10-CM | POA: Diagnosis not present

## 2024-07-14 ENCOUNTER — Inpatient Hospital Stay: Admitting: Student in an Organized Health Care Education/Training Program

## 2024-07-14 LAB — VITAMIN B1: Vitamin B1 (Thiamine): 127 nmol/L (ref 66.5–200.0)

## 2024-07-15 ENCOUNTER — Other Ambulatory Visit: Payer: Self-pay | Admitting: Student in an Organized Health Care Education/Training Program

## 2024-07-15 DIAGNOSIS — G959 Disease of spinal cord, unspecified: Secondary | ICD-10-CM

## 2024-07-18 ENCOUNTER — Inpatient Hospital Stay: Admitting: Student in an Organized Health Care Education/Training Program

## 2024-07-22 ENCOUNTER — Encounter: Payer: Self-pay | Admitting: Student in an Organized Health Care Education/Training Program

## 2024-07-22 ENCOUNTER — Ambulatory Visit: Admitting: Student in an Organized Health Care Education/Training Program

## 2024-07-22 VITALS — BP 148/91 | HR 82 | Wt 216.0 lb

## 2024-07-22 DIAGNOSIS — R7989 Other specified abnormal findings of blood chemistry: Secondary | ICD-10-CM | POA: Insufficient documentation

## 2024-07-22 DIAGNOSIS — G43909 Migraine, unspecified, not intractable, without status migrainosus: Secondary | ICD-10-CM | POA: Insufficient documentation

## 2024-07-22 DIAGNOSIS — S92255D Nondisplaced fracture of navicular [scaphoid] of left foot, subsequent encounter for fracture with routine healing: Secondary | ICD-10-CM

## 2024-07-22 DIAGNOSIS — S92252A Displaced fracture of navicular [scaphoid] of left foot, initial encounter for closed fracture: Secondary | ICD-10-CM

## 2024-07-22 DIAGNOSIS — G43709 Chronic migraine without aura, not intractable, without status migrainosus: Secondary | ICD-10-CM | POA: Diagnosis not present

## 2024-07-22 HISTORY — DX: Displaced fracture of navicular (scaphoid) of left foot, initial encounter for closed fracture: S92.252A

## 2024-07-22 MED ORDER — SUMATRIPTAN SUCCINATE 25 MG PO TABS: 25.0000 mg | ORAL_TABLET | ORAL | 2 refills | Status: DC | PRN

## 2024-07-22 NOTE — Patient Instructions (Signed)
  VISIT SUMMARY: Today, you were seen for your migraine headaches, recent foot fracture, and ongoing depression. We discussed your symptoms, current medications, and the impact these issues are having on your daily life.  YOUR PLAN: -MIGRAINE HEADACHE WITH MEDICATION OVERUSE: Your migraine headaches are becoming more frequent, likely due to the overuse of over-the-counter medications like Advil  and Tylenol . We will prescribe Imitrex  to use as needed for migraine relief. Please stop taking Advil , Aleve , and Tylenol  for 3-5 days to prevent medication overuse headaches. Continue taking Cymbalta  and gabapentin  as prescribed, and you may use muscle relaxers occasionally if needed.  -NONDISPLACED NAVICULAR FRACTURE OF LEFT FOOT: You have a nondisplaced fracture in your left foot, which means the bone is broken but has not moved out of place. You are currently using a boot and are off work. Please follow up with an orthopedic surgeon on Wednesday for further evaluation and management.  -MAJOR DEPRESSIVE DISORDER: You are experiencing mood fluctuations and stress, which are related to personal issues. Continue taking your current depression medication. Please follow up if your symptoms worsen.  INSTRUCTIONS: Please follow up with an orthopedic surgeon on Wednesday for your foot fracture. If your migraine headaches or depression symptoms worsen, schedule a follow-up appointment.

## 2024-07-22 NOTE — Progress Notes (Signed)
 Established Patient Office Visit  Subjective   Patient ID: Alan Sanders, male    DOB: 05-Dec-1970  Age: 53 y.o. MRN: [id number]  Chief Complaint  Patient presents with   Migraine    Patient has been having sever migraines and does take Advil  but it has not been helping. Patient is wanting to discuss disability. Injured left foot as well.     HPI  Discussed the use of AI scribe software for clinical note transcription with the patient, who gave verbal consent to proceed.  History of Present Illness Alan Sanders is a 53 year old male who presents with migraine headaches.  He experiences migraine headaches with increasing frequency, occurring every couple of weeks. The pain is located in the frontal region and is sometimes associated with nausea, but not with photophobia. Over-the-counter medications like Advil  and Tylenol , often taken in high doses, have not been effective. The severity of the headaches occasionally causes him to miss work.  He has a history of falls, with a recent incident on July 11, 2024, resulting in two broken bones in his left foot. He was taken to the hospital by EMS. A previous neck surgery intended to address his falls has not resolved the issue. He also broke his leg in April of the previous year, contributing to his mobility difficulties. He has thought about going on disability and has mobility difficulties.  He is currently taking depression medication and gabapentin  daily, with muscle relaxers and allergy medicine as needed. He denies alcohol use but smokes tobacco. He is experiencing stress related to a divorce and family dynamics.  A past MRI of the brain following a head injury during a fall showed nonspecific findings. He also has a history of liver issues, though it was determined not to be cancer-related.      Objective:     BP (!) 148/91   Pulse 82   Wt 216 lb (98 kg)   SpO2 99%   BMI 27.73 kg/m    Physical Exam  Gen:  Well-appearing man Neck: Normal thyroid , some enlarged submandibular salivary glands but no clear adenopathy Heart: Regular, no murmur Lungs: Unlabored, clear throughout Ext: Warm, no edema, tenderness in the left foot     Assessment & Plan:    Problem List Items Addressed This Visit       Unprioritized   Migraines - Primary   New diagnosis to me.  Mostly affecting in the last few weeks.  Many features sound migrainous, including nausea, time course and disability.  NSAIDs have been ineffective.  I think there may be a component here of medication overuse headache as well as tension type headache.  He is going through a lot of stress right now.  Will try to escalate treatment of migraine by starting sumatriptan  hand.  He has no history of stroke or other ischemic vascular disease.  Risk factors are pretty well-managed.  If this does not work, may try a CGRP inhibitor in the future.  I did recommend that he discontinue all NSAIDs and use them more sparingly in the future.      Relevant Medications   SUMAtriptan  (IMITREX ) 25 MG tablet   Abnormal liver function tests   Chronic problem.  Associated with chronic thrombocytopenia and splenomegaly.  He has had evaluation with GI.  Unclear etiology of these abnormalities.  No signs of cirrhosis on exam.  No alcohol use.  At risk for metabolic associated liver disease but imaging has been okay  so far.  I will continue to monitor, he will also continue to follow-up with Dr. Stacia with GI.      Left navicular fracture of foot   A nondisplaced navicular fracture is causing mobility issues. He is currently using a boot and is off work. Follow up with an orthopedic surgeon on Wednesday for further evaluation and management.       Return in about 3 months (around 10/22/2024).    Cleatus Debby Specking, MD

## 2024-07-22 NOTE — Assessment & Plan Note (Signed)
 A nondisplaced navicular fracture is causing mobility issues. He is currently using a boot and is off work. Follow up with an orthopedic surgeon on Wednesday for further evaluation and management.

## 2024-07-22 NOTE — Assessment & Plan Note (Signed)
 New diagnosis to me.  Mostly affecting in the last few weeks.  Many features sound migrainous, including nausea, time course and disability.  NSAIDs have been ineffective.  I think there may be a component here of medication overuse headache as well as tension type headache.  He is going through a lot of stress right now.  Will try to escalate treatment of migraine by starting sumatriptan  hand.  He has no history of stroke or other ischemic vascular disease.  Risk factors are pretty well-managed.  If this does not work, may try a CGRP inhibitor in the future.  I did recommend that he discontinue all NSAIDs and use them more sparingly in the future.

## 2024-07-22 NOTE — Assessment & Plan Note (Signed)
 Chronic problem.  Associated with chronic thrombocytopenia and splenomegaly.  He has had evaluation with GI.  Unclear etiology of these abnormalities.  No signs of cirrhosis on exam.  No alcohol use.  At risk for metabolic associated liver disease but imaging has been okay so far.  I will continue to monitor, he will also continue to follow-up with Dr. Stacia with GI.

## 2024-07-27 ENCOUNTER — Telehealth: Payer: Self-pay

## 2024-07-27 DIAGNOSIS — M25572 Pain in left ankle and joints of left foot: Secondary | ICD-10-CM | POA: Diagnosis not present

## 2024-07-27 DIAGNOSIS — M79672 Pain in left foot: Secondary | ICD-10-CM | POA: Diagnosis not present

## 2024-07-27 NOTE — Telephone Encounter (Signed)
 Patient would like to take something to give him more energy. Please advise, thank you    Copied from CRM [id number]. Topic: Clinical - Medication Question >> Jul 27, 2024  3:45 PM Paige D wrote: Reason for CRM: Pt is wondering if he csn be put on a vitamin that help gives pt more energy and just overall a good vitamin. Please reach out to pt in regards to this.

## 2024-07-28 NOTE — Telephone Encounter (Signed)
 Patient has read my chart message.

## 2024-07-28 NOTE — Telephone Encounter (Signed)
 Unfortunately, no medication exists to give him more energy.  If this is a new symptom of fatigue, would recommend he come in and see me and we can talk more about potential underlying causes.  For a general multivitamin, I think Centrum is a reasonable brand.

## 2024-07-28 NOTE — Telephone Encounter (Signed)
 Called patient, Left Vm to return call or can view  mychart

## 2024-07-28 NOTE — Telephone Encounter (Signed)
 Go ahead and call this patient to relay the information. It's been since 8/29 that he's logged in to mychart so it looks like he may not be as consistent with that so a phone call would be more efficient and provider better patient service.

## 2024-08-04 ENCOUNTER — Emergency Department (HOSPITAL_BASED_OUTPATIENT_CLINIC_OR_DEPARTMENT_OTHER)
Admission: EM | Admit: 2024-08-04 | Discharge: 2024-08-04 | Disposition: A | Discharge status: Home / Self Care | Attending: Emergency Medicine | Admitting: Emergency Medicine

## 2024-08-04 ENCOUNTER — Encounter (HOSPITAL_BASED_OUTPATIENT_CLINIC_OR_DEPARTMENT_OTHER): Payer: Self-pay | Admitting: *Deleted

## 2024-08-04 ENCOUNTER — Emergency Department (HOSPITAL_BASED_OUTPATIENT_CLINIC_OR_DEPARTMENT_OTHER)

## 2024-08-04 DIAGNOSIS — R1032 Left lower quadrant pain: Secondary | ICD-10-CM | POA: Diagnosis not present

## 2024-08-04 DIAGNOSIS — K5730 Diverticulosis of large intestine without perforation or abscess without bleeding: Secondary | ICD-10-CM | POA: Diagnosis not present

## 2024-08-04 DIAGNOSIS — R7401 Elevation of levels of liver transaminase levels: Secondary | ICD-10-CM | POA: Diagnosis not present

## 2024-08-04 DIAGNOSIS — R162 Hepatomegaly with splenomegaly, not elsewhere classified: Secondary | ICD-10-CM | POA: Insufficient documentation

## 2024-08-04 DIAGNOSIS — K4090 Unilateral inguinal hernia, without obstruction or gangrene, not specified as recurrent: Secondary | ICD-10-CM | POA: Diagnosis not present

## 2024-08-04 LAB — URINALYSIS, ROUTINE W REFLEX MICROSCOPIC
Bilirubin Urine: NEGATIVE
Glucose, UA: NEGATIVE mg/dL
Hgb urine dipstick: NEGATIVE
Ketones, ur: NEGATIVE mg/dL
Leukocytes,Ua: NEGATIVE
Nitrite: NEGATIVE
Specific Gravity, Urine: 1.032 — ABNORMAL HIGH (ref 1.005–1.030)
pH: 6 (ref 5.0–8.0)

## 2024-08-04 LAB — COMPREHENSIVE METABOLIC PANEL WITH GFR
ALT: 32 U/L (ref 0–44)
AST: 67 U/L — ABNORMAL HIGH (ref 15–41)
Albumin: 5 g/dL (ref 3.5–5.0)
Alkaline Phosphatase: 90 U/L (ref 38–126)
Anion gap: 15 (ref 5–15)
BUN: 9 mg/dL (ref 6–20)
CO2: 22 mmol/L (ref 22–32)
Calcium: 9.9 mg/dL (ref 8.9–10.3)
Chloride: 102 mmol/L (ref 98–111)
Creatinine, Ser: 0.8 mg/dL (ref 0.61–1.24)
GFR, Estimated: >60 mL/min (ref >60)
Glucose, Bld: 164 mg/dL — ABNORMAL HIGH (ref 70–99)
Potassium: 4.5 mmol/L (ref 3.5–5.1)
Sodium: 139 mmol/L (ref 135–145)
Total Bilirubin: 0.6 mg/dL (ref 0.0–1.2)
Total Protein: 8 g/dL (ref 6.5–8.1)

## 2024-08-04 LAB — CBC
HCT: 40.6 % (ref 39.0–52.0)
Hemoglobin: 14.3 g/dL (ref 13.0–17.0)
MCH: 29.9 pg (ref 26.0–34.0)
MCHC: 35.2 g/dL (ref 30.0–36.0)
MCV: 84.9 fL (ref 80.0–100.0)
Platelets: 117 K/uL — ABNORMAL LOW (ref 150–400)
RBC: 4.78 MIL/uL (ref 4.22–5.81)
RDW: 15.4 % (ref 11.5–15.5)
WBC: 5.7 K/uL (ref 4.0–10.5)
nRBC: 0 % (ref 0.0–0.2)

## 2024-08-04 LAB — LIPASE, BLOOD: Lipase: 33 U/L (ref 11–51)

## 2024-08-04 MED ORDER — FENTANYL CITRATE PF 50 MCG/ML IJ SOSY
50.0000 ug | PREFILLED_SYRINGE | Freq: Once | INTRAMUSCULAR | Status: AC
  Administered 2024-08-04: 50 ug via INTRAVENOUS
  Filled 2024-08-04: qty 1

## 2024-08-04 MED ORDER — OXYCODONE-ACETAMINOPHEN 5-325 MG PO TABS: 1.0000 | ORAL_TABLET | Freq: Four times a day (QID) | ORAL | 0 refills | Status: DC | PRN

## 2024-08-04 MED ORDER — ONDANSETRON HCL 4 MG/2ML IJ SOLN
4.0000 mg | Freq: Once | INTRAMUSCULAR | Status: AC
  Administered 2024-08-04: 4 mg via INTRAVENOUS
  Filled 2024-08-04: qty 2

## 2024-08-04 MED ORDER — IOHEXOL 300 MG/ML  SOLN
100.0000 mL | Freq: Once | INTRAMUSCULAR | Status: AC | PRN
  Administered 2024-08-04: 100 mL via INTRAVENOUS

## 2024-08-04 MED ORDER — MORPHINE SULFATE (PF) 4 MG/ML IV SOLN
4.0000 mg | Freq: Once | INTRAVENOUS | Status: AC
  Administered 2024-08-04: 4 mg via INTRAVENOUS
  Filled 2024-08-04: qty 1

## 2024-08-04 NOTE — ED Triage Notes (Signed)
 Sudden onset abd pain today, tenderness, NVD without fevers. Abd distended in triage.

## 2024-08-04 NOTE — ED Notes (Signed)
  at bedside

## 2024-08-04 NOTE — ED Provider Notes (Signed)
 [place] EMERGENCY DEPARTMENT AT [facility] Provider Note   CSN: [id number] Arrival date & time: 08/04/24  1615     Patient presents with: Abdominal Pain   Alan Sanders is a 53 y.o. male who presents to the ED today with left lower quadrant pain that began this morning, increased incidence of loose stools, states he has loose stools frequently at baseline however this has been above and beyond his normal.  Review of his previous medical history does show previous diagnosis of colonic polyps, GERD, left-sided inguinal hernia.  Denies any vomiting but does endorse intermittent nausea.  Has a decreased appetite since this morning, has not had any food and limited liquid intake since this morning.  Endorses generalized malaise secondary to left-sided lower abdominal pain.  He states that the pain begins in the left pelvic region and radiates up into the left flank.    Abdominal Pain Associated symptoms: diarrhea and nausea        Prior to Admission medications   Medication Sig Start Date End Date Taking? Authorizing Provider  oxyCODONE -acetaminophen  (PERCOCET/ROXICET) 5-325 MG tablet Take 1 tablet by mouth every 6 (six) hours as needed for severe pain (pain score 7-10). 08/04/24  Yes Alan Carrier C, PA  dicyclomine  (BENTYL ) 20 MG tablet TAKE 1 TABLET (20 MG TOTAL) BY MOUTH 2 (TWO) TIMES DAILY AS NEEDED FOR SPASMS. 05/30/24   Stacia Glendia BRAVO, MD  DULoxetine  (CYMBALTA ) 30 MG capsule Take 1 capsule (30 mg total) by mouth daily. 03/01/24   Jerrell Cleatus Ned, MD  gabapentin  (NEURONTIN ) 300 MG capsule TAKE 1 CAPSULE BY MOUTH THREE TIMES A DAY 05/30/24   Jerrell Cleatus Ned, MD  methocarbamol  (ROBAXIN ) 750 MG tablet TAKE 1 TABLET (750 MG TOTAL) BY MOUTH DAILY AS NEEDED FOR MUSCLE SPASMS 07/19/24   Jerrell Cleatus Ned, MD  omeprazole  (PRILOSEC) 20 MG capsule Take 1 capsule (20 mg total) by mouth daily. 06/23/24   Horton, Charmaine FALCON, MD  pantoprazole  (PROTONIX ) 40 MG tablet  Take 1 tablet (40 mg total) by mouth daily. 03/01/24 02/24/25  Stacia Glendia BRAVO, MD  SUMAtriptan  (IMITREX ) 25 MG tablet Take 1 tablet (25 mg total) by mouth every 2 (two) hours as needed for migraine. May repeat in 2 hours if headache persists or recurs. 07/22/24   Jerrell Cleatus Ned, MD    Allergies: Hydromorphone     Review of Systems  Gastrointestinal:  Positive for abdominal pain, diarrhea and nausea.  All other systems reviewed and are negative.   Updated Vital Signs BP (!) 114/98   Pulse 81   Temp 98.8 F (37.1 C) (Oral)   Resp 18   SpO2 97%   Physical Exam Vitals and nursing note reviewed.  Constitutional:      General: He is not in acute distress.    Appearance: Normal appearance.  HENT:     Head: Normocephalic and atraumatic.     Mouth/Throat:     Mouth: Mucous membranes are moist.     Pharynx: Oropharynx is clear.  Eyes:     Extraocular Movements: Extraocular movements intact.     Conjunctiva/sclera: Conjunctivae normal.     Pupils: Pupils are equal, round, and reactive to light.  Cardiovascular:     Rate and Rhythm: Normal rate and regular rhythm.     Pulses: Normal pulses.     Heart sounds: Normal heart sounds. No murmur heard.    No friction rub. No gallop.  Pulmonary:     Effort: Pulmonary effort is normal.  Breath sounds: Normal breath sounds.  Abdominal:     General: Abdomen is flat. Bowel sounds are normal.     Palpations: Abdomen is soft.     Tenderness: There is abdominal tenderness in the suprapubic area, left upper quadrant and left lower quadrant. Negative signs include Murphy's sign, Rovsing's sign and McBurney's sign.  Musculoskeletal:        General: Normal range of motion.     Cervical back: Normal range of motion and neck supple.     Right lower leg: No edema.     Left lower leg: No edema.  Skin:    General: Skin is warm and dry.     Capillary Refill: Capillary refill takes less than 2 seconds.  Neurological:     General: No focal  deficit present.     Mental Status: He is alert and oriented to person, place, and time. Mental status is at baseline.     GCS: GCS eye subscore is 4. GCS verbal subscore is 5. GCS motor subscore is 6.  Psychiatric:        Mood and Affect: Mood normal.     (all labs ordered are listed, but only abnormal results are displayed) Labs Reviewed  COMPREHENSIVE METABOLIC PANEL WITH GFR - Abnormal; Notable for the following components:      Result Value   Glucose, Bld 164 (*)    AST 67 (*)    All other components within normal limits  URINALYSIS, ROUTINE W REFLEX MICROSCOPIC - Abnormal; Notable for the following components:   Specific Gravity, Urine 1.032 (*)    Protein, ur TRACE (*)    All other components within normal limits  CBC - Abnormal; Notable for the following components:   Platelets 117 (*)    All other components within normal limits  LIPASE, BLOOD    EKG: None  Radiology: US  SCROTUM W/DOPPLER Result Date: 08/04/2024 CLINICAL DATA:  Left-sided groin pain. EXAM: SCROTAL ULTRASOUND DOPPLER ULTRASOUND OF THE TESTICLES TECHNIQUE: Complete ultrasound examination of the testicles, epididymis, and other scrotal structures was performed. Color and spectral Doppler ultrasound were also utilized to evaluate blood flow to the testicles. COMPARISON:  Ultrasound dated 01/04/2007. FINDINGS: Right testicle Measurements: 5.4 x 2.3 x 3.0 cm. No mass or microlithiasis visualized. Left testicle Measurements: 5.0 x 3.0 x 3.7 cm. No mass or microlithiasis visualized. Right epididymis:  Normal in size and appearance. Left epididymis:  Normal in size and appearance. Hydrocele:  None visualized. Varicocele:  None visualized. Pulsed Doppler interrogation of both testes demonstrates normal low resistance arterial and venous waveforms bilaterally. No inguinal hernia over the left groin. IMPRESSION: Unremarkable testicular ultrasound. Electronically Signed   By: Vanetta Chou M.D.   On: 08/04/2024 21:11    CT ABDOMEN PELVIS W CONTRAST Result Date: 08/04/2024 CLINICAL DATA:  Left lower quadrant abdominal pain EXAM: CT ABDOMEN AND PELVIS WITH CONTRAST TECHNIQUE: Multidetector CT imaging of the abdomen and pelvis was performed using the standard protocol following bolus administration of intravenous contrast. RADIATION DOSE REDUCTION: This exam was performed according to the departmental dose-optimization program which includes automated exposure control, adjustment of the mA and/or kV according to patient size and/or use of iterative reconstruction technique. CONTRAST:  OMNIPAQUE  IOHEXOL  300 MG/ML  SOLN COMPARISON:  CT abdomen pelvis Apr 01, 2024 FINDINGS: Lower chest: No acute abnormality. Hepatobiliary: Hepatomegaly measuring 18.8 cm in craniocaudal dimension. Mild hepatic steatosis. No focal liver abnormality is seen. No gallstones, gallbladder wall thickening, or biliary dilatation. Pancreas: Unremarkable. No pancreatic  ductal dilatation or surrounding inflammatory changes. Spleen: Splenomegaly measuring 18.5 cm Adrenals/Urinary Tract: Adrenal glands are unremarkable. Kidneys are normal, without renal calculi, focal lesion, or hydronephrosis. Bladder is unremarkable. Stomach/Bowel: Stomach is within normal limits. Appendix appears normal. Submucosal fat along the ascending colon and terminal ileum suggestive of chronic inflammatory changes. A few sigmoid diverticula without suspicious finding to suggest diverticulitis. No evidence of bowel wall thickening, distention or obstruction. Vascular/Lymphatic: No significant vascular findings are present. No enlarged abdominal or pelvic lymph nodes. Reproductive: Prostate is unremarkable. Other: Small left inguinal fat containing hernia. Status post right inguinal hernia mesh repair. No abdominopelvic ascites. Musculoskeletal: No acute or significant osseous findings. IMPRESSION: Hepatomegaly and hepatic steatosis. Splenomegaly. Submucosal fat along the ascending  colon and terminal ileum suggestive of chronic inflammatory changes. Correlate with history of chronic colitis. Left inguinal fat containing hernia. Electronically Signed   By: Megan  Zare M.D.   On: 08/04/2024 18:35     Procedures   Medications Ordered in the ED  fentaNYL  (SUBLIMAZE ) injection 50 mcg (50 mcg Intravenous Given 08/04/24 1816)  ondansetron  (ZOFRAN ) injection 4 mg (4 mg Intravenous Given 08/04/24 1815)  iohexol  (OMNIPAQUE ) 300 MG/ML solution 100 mL (100 mLs Intravenous Contrast Given 08/04/24 1757)  morphine  (PF) 4 MG/ML injection 4 mg (4 mg Intravenous Given 08/04/24 1928)                                    Medical Decision Making Amount and/or Complexity of Data Reviewed Labs: ordered. Radiology: ordered.  Risk Prescription drug management.   Medical Decision Making:   Alan Sanders is a 52 y.o. male who presented to the ED today with sudden onset of left lower quadrant pain detailed above.     Complete initial physical exam performed, notably the patient  was alert and oriented but visibly uncomfortable, no apparent distress.  Physical exam is remarkable for point tenderness to the left lower quadrant otherwise normal present bowel sounds..    Reviewed and confirmed nursing documentation for past medical history, family history, social history.    Initial Assessment:   With the patient's presentation of abdominal discomfort, consider differential diagnosis of diverticulitis, inflammatory bowel disease, inguinal hernia, testicular torsion, bowel obstruction, gastroenteritis.   Initial Plan:  Obtain CT imaging of the abdomen and pelvis to assess for intra-abdominal pathology Screening labs including CBC and Metabolic panel to evaluate for infectious or metabolic etiology of disease.  Add serum lipase to assess for pancreatic etiology. Urinalysis with reflex culture ordered to evaluate for UTI or relevant urologic/nephrologic pathology.  Obtain ultrasound of scrotum  to assess for testicular torsion. Objective evaluation as below reviewed   Initial Study Results:   Laboratory  All laboratory results reviewed without evidence of clinically relevant pathology.   Exceptions include: None  Radiology:  All images reviewed independently. Agree with radiology report at this time.   US  SCROTUM W/DOPPLER Result Date: 08/04/2024 CLINICAL DATA:  Left-sided groin pain. EXAM: SCROTAL ULTRASOUND DOPPLER ULTRASOUND OF THE TESTICLES TECHNIQUE: Complete ultrasound examination of the testicles, epididymis, and other scrotal structures was performed. Color and spectral Doppler ultrasound were also utilized to evaluate blood flow to the testicles. COMPARISON:  Ultrasound dated 01/04/2007. FINDINGS: Right testicle Measurements: 5.4 x 2.3 x 3.0 cm. No mass or microlithiasis visualized. Left testicle Measurements: 5.0 x 3.0 x 3.7 cm. No mass or microlithiasis visualized. Right epididymis:  Normal in size and appearance. Left epididymis:  Normal in size and appearance. Hydrocele:  None visualized. Varicocele:  None visualized. Pulsed Doppler interrogation of both testes demonstrates normal low resistance arterial and venous waveforms bilaterally. No inguinal hernia over the left groin. IMPRESSION: Unremarkable testicular ultrasound. Electronically Signed   By: Vanetta Chou M.D.   On: 08/04/2024 21:11   CT ABDOMEN PELVIS W CONTRAST Result Date: 08/04/2024 CLINICAL DATA:  Left lower quadrant abdominal pain EXAM: CT ABDOMEN AND PELVIS WITH CONTRAST TECHNIQUE: Multidetector CT imaging of the abdomen and pelvis was performed using the standard protocol following bolus administration of intravenous contrast. RADIATION DOSE REDUCTION: This exam was performed according to the departmental dose-optimization program which includes automated exposure control, adjustment of the mA and/or kV according to patient size and/or use of iterative reconstruction technique. CONTRAST:  OMNIPAQUE   IOHEXOL  300 MG/ML  SOLN COMPARISON:  CT abdomen pelvis Apr 01, 2024 FINDINGS: Lower chest: No acute abnormality. Hepatobiliary: Hepatomegaly measuring 18.8 cm in craniocaudal dimension. Mild hepatic steatosis. No focal liver abnormality is seen. No gallstones, gallbladder wall thickening, or biliary dilatation. Pancreas: Unremarkable. No pancreatic ductal dilatation or surrounding inflammatory changes. Spleen: Splenomegaly measuring 18.5 cm Adrenals/Urinary Tract: Adrenal glands are unremarkable. Kidneys are normal, without renal calculi, focal lesion, or hydronephrosis. Bladder is unremarkable. Stomach/Bowel: Stomach is within normal limits. Appendix appears normal. Submucosal fat along the ascending colon and terminal ileum suggestive of chronic inflammatory changes. A few sigmoid diverticula without suspicious finding to suggest diverticulitis. No evidence of bowel wall thickening, distention or obstruction. Vascular/Lymphatic: No significant vascular findings are present. No enlarged abdominal or pelvic lymph nodes. Reproductive: Prostate is unremarkable. Other: Small left inguinal fat containing hernia. Status post right inguinal hernia mesh repair. No abdominopelvic ascites. Musculoskeletal: No acute or significant osseous findings. IMPRESSION: Hepatomegaly and hepatic steatosis. Splenomegaly. Submucosal fat along the ascending colon and terminal ileum suggestive of chronic inflammatory changes. Correlate with history of chronic colitis. Left inguinal fat containing hernia. Electronically Signed   By: Megan  Zare M.D.   On: 08/04/2024 18:35     Reassessment and Plan:   CT imaging did show new findings of hepatomegaly and splenomegaly.  There is also a submucosal fat along the ascending colon and terminal ileum with suggestion of chronic inflammatory changes.  There is also a left inguinal fat-containing hernia.  Lab work is only remarkable for an elevated AST, no elevation in ALT or alkaline phosphatase.   Ultrasound did not show any acute testicular torsion and showed normal blood flow.  Pain is managed with IV morphine .  Given findings on exam today, we will continue to manage his pain with short outpatient course of hydrocodone , and then continue to take NSAIDs and Tylenol  as needed for pain.  Plan is to follow-up with primary care regarding newfound hepatosplenomegaly, as well as likely pain secondary to fat-containing inguinal hernia.  Patient understands and agrees has no further concerns at this time.       Final diagnoses:  Left inguinal hernia  Hepatosplenomegaly  Transaminitis    ED Discharge Orders          Ordered    oxyCODONE -acetaminophen  (PERCOCET/ROXICET) 5-325 MG tablet  Every 6 hours PRN        08/04/24 2124               Alan Sanders, Alan Sanders 08/04/24 2124    Lenor Hollering, MD 08/04/24 2326

## 2024-08-04 NOTE — ED Notes (Signed)
 Reviewed AVS/discharge instruction with patient. Time allotted for and all questions answered. Patient is agreeable for d/c and escorted to ed exit by staff.

## 2024-08-05 ENCOUNTER — Telehealth: Payer: Self-pay

## 2024-08-05 ENCOUNTER — Other Ambulatory Visit: Payer: Self-pay | Admitting: Student in an Organized Health Care Education/Training Program

## 2024-08-05 DIAGNOSIS — G959 Disease of spinal cord, unspecified: Secondary | ICD-10-CM

## 2024-08-05 NOTE — Transitions of Care (Post Inpatient/ED Visit) (Signed)
   08/05/2024  Name: Alan Sanders MRN: [id number] DOB: Aug 22, 1971  Today's TOC FU Call Status:    Attempted to reach the patient regarding the most recent Inpatient/ED visit.  Follow Up Plan: Additional outreach attempts will be made to reach the patient to complete the Transitions of Care (Post Inpatient/ED visit) call.   Signature Burnard Gaskins, CMA

## 2024-08-08 ENCOUNTER — Other Ambulatory Visit: Payer: Self-pay | Admitting: Student in an Organized Health Care Education/Training Program

## 2024-08-08 ENCOUNTER — Ambulatory Visit: Payer: Self-pay

## 2024-08-08 ENCOUNTER — Inpatient Hospital Stay: Admitting: Student in an Organized Health Care Education/Training Program

## 2024-08-08 NOTE — Telephone Encounter (Signed)
 FYI Only or Action Required?: Action required by provider: medication refill request.  Patient was last seen in primary care on 07/22/2024 by Jerrell Cleatus Ned, MD.  Called Nurse Triage reporting Pain. In hernia area  Symptoms began Pain has been getting worse.  Interventions attempted: Prescription medications: rx pain medications.  Symptoms are: gradually worsening.  Triage Disposition: No disposition on file.  Patient/caregiver understands and will follow disposition?: yes - pt will call back in pain increases                 Copied from CRM #[id number]. Topic: Clinical - Red Word Triage >> Aug 08, 2024  9:47 AM [place] J wrote: [facility] that prompted transfer to Nurse Triage: Patient is needing to schedule a hospital follow-up that he missed this morning. Patient states that he is still in pain from where the hernia was. Answer Assessment - Initial Assessment Questions 1. LOCATION: Where does it hurt?      Left side -  2. RADIATION: Does the pain shoot anywhere else? (e.g., chest, back)     Up on stomach 3. ONSET: When did the pain begin? (Minutes, hours or days ago)      Pain ongoing for a long times 4. SUDDEN: Gradual or sudden onset?     gradual 5. PATTERN Does the pain come and go, or is it constant?     Comes and goes 6. SEVERITY: How bad is the pain?  (e.g., Scale 1-10; mild, moderate, or severe)     5/10 7. RECURRENT SYMPTOM: Have you ever had this type of stomach pain before? If Yes, ask: When was the last time? and What happened that time?      yes 8. CAUSE: What do you think is causing the stomach pain? (e.g., gallstones, recent abdominal surgery)     Hernia sx 9. RELIEVING/AGGRAVATING FACTORS: What makes it better or worse? (e.g., antacids, bending or twisting motion, bowel movement)     Nothing changes pain level 10. OTHER SYMPTOMS: Do you have any other symptoms? (e.g., back pain, diarrhea, fever, urination pain, vomiting)        diarrhea  Protocols used: Abdominal Pain - Male-A-AH

## 2024-08-09 ENCOUNTER — Encounter: Payer: Self-pay | Admitting: Student in an Organized Health Care Education/Training Program

## 2024-08-09 ENCOUNTER — Ambulatory Visit: Admitting: Student in an Organized Health Care Education/Training Program

## 2024-08-09 VITALS — BP 130/83 | HR 91 | Wt 208.0 lb

## 2024-08-09 DIAGNOSIS — K769 Liver disease, unspecified: Secondary | ICD-10-CM

## 2024-08-09 DIAGNOSIS — K7460 Unspecified cirrhosis of liver: Secondary | ICD-10-CM

## 2024-08-09 DIAGNOSIS — K529 Noninfective gastroenteritis and colitis, unspecified: Secondary | ICD-10-CM | POA: Insufficient documentation

## 2024-08-09 DIAGNOSIS — S92255D Nondisplaced fracture of navicular [scaphoid] of left foot, subsequent encounter for fracture with routine healing: Secondary | ICD-10-CM

## 2024-08-09 DIAGNOSIS — K4090 Unilateral inguinal hernia, without obstruction or gangrene, not specified as recurrent: Secondary | ICD-10-CM | POA: Diagnosis not present

## 2024-08-09 NOTE — Assessment & Plan Note (Signed)
 Improved at this point with nonsurgical management.  Ambulating well.  I think he is okay to return to work from the perspective of his foot discomfort.

## 2024-08-09 NOTE — Assessment & Plan Note (Signed)
 A left inguinal hernia is identified on imaging. Pain seems to be more related to colonic inflammation rather than the hernia. Monitor symptoms and consider further evaluation if pain persists or worsens.  Given worsening in his liver disease, I do not think he is a good candidate for surgical repair of either the inguinal or umbilical hernias.

## 2024-08-09 NOTE — Progress Notes (Signed)
 Established Patient Office Visit  Subjective   Patient ID: Alan Sanders, male    DOB: Apr 29, 1971  Age: 53 y.o. MRN: [id number]  Chief Complaint  Patient presents with   Hospitalization Follow-up    Was seen for abdominal pain in ER 9/11 and was told it was a hernia on left side. Still having pain on that left side.  Nose has been bleeding as well with allergies     HPI  Discussed the use of AI scribe software for clinical note transcription with the patient, who gave verbal consent to proceed.  History of Present Illness Alan Sanders is a 53 year old male with chronic colitis who presents with abdominal pain and diarrhea.  He has been experiencing intermittent abdominal pain since the morning of September 11th, which worsens with physical activity and sometimes radiates to the testicular region. He has been taking pain medication prescribed at the hospital but has run out. The pain affects his ability to work, and he has not returned to work since the onset of symptoms.  A CT scan and ultrasound at the emergency department showed splenomegaly, hepatomegaly, and a left inguinal fat-containing hernia. He has a history of chronic colitis, and the CT scan showed chronic inflammatory changes in the terminal ileum. He has undergone multiple colonoscopies in the past, which have revealed numerous polyps, but he is not currently on any specific medication for this condition. He takes Pepto Bismol, which does not help.  He experiences daily diarrhea, with two to three loose bowel movements per day, sometimes more. This has been a chronic issue, and he has seen a specialist for his stomach issues, but no definitive diagnosis has been made. He recalls a past episode of blood in his stool, which led to a hospital visit, but recent tests for blood and C. diff have been negative. He has a history of norovirus in 2024.  He reports unintentional weight loss, noting a decrease from 214 pounds  to 208 pounds. No recent fevers, but he mentions feeling hot at times. He also reports frequent gas and abdominal discomfort after meals, such as after eating hamburgers.  His current medications include Cymbalta , Protonix , and gabapentin , which he takes for foot pain. He does not consume alcohol, having quit years ago when he learned he was going to become a father. He smokes and uses smokeless tobacco. He lives with his father and is currently on FMLA due to a previous foot injury.      Objective:     BP 130/83   Pulse 91   Wt 208 lb (94.3 kg)   SpO2 100%   BMI 26.71 kg/m   Physical Exam  Gen: Tired appearing man Heart: Regular, no murmur Lungs: Unlabored, clear throughout Abd: Mildly distended, tender to palpation diffusely, very small nontender umbilical hernia, small left sided inguinal hernia is mildly tender to palpation Ext: Warm, no edema    Assessment & Plan:   Problem List Items Addressed This Visit       High   Chronic liver disease and cirrhosis (HCC) (Chronic)   Splenomegaly and hepatomegaly are present with elevated AST and low platelets. Hepatitis workups are normal. Possible etiology includes alcohol use or other causes. Monitor liver function with follow-up in a couple of months. Advised continued abstinence from alcohol.       Colitis - Primary   Recurrent diarrhea with abdominal pain and colonic inflammation is noted on CT. Differential diagnosis includes Crohn's disease, ulcerative  colitis, or an infectious cause. Provide a stool sample kit to test for GI pathogen panel and C. difficile. Consider referral to Dr. Stacia if symptoms persist for evaluation of Crohn's or ulcerative colitis. Recommend ibuprofen  400 mg, 2-3 times daily for 3-4 days for pain management.       Relevant Orders   GI Profile, Stool, PCR   C difficile Toxins A+B W/Rflx     Unprioritized   Left navicular fracture of foot   Improved at this point with nonsurgical management.   Ambulating well.  I think he is okay to return to work from the perspective of his foot discomfort.      Left inguinal hernia   A left inguinal hernia is identified on imaging. Pain seems to be more related to colonic inflammation rather than the hernia. Monitor symptoms and consider further evaluation if pain persists or worsens.  Given worsening in his liver disease, I do not think he is a good candidate for surgical repair of either the inguinal or umbilical hernias.       Return in about 4 weeks (around 09/06/2024).    Cleatus Debby Specking, MD

## 2024-08-09 NOTE — Patient Instructions (Signed)
  VISIT SUMMARY: Today, we discussed your ongoing abdominal pain and diarrhea, which have been affecting your daily life and ability to work. We reviewed your recent CT scan and ultrasound results, which showed inflammation in your colon, an inguinal hernia, and enlarged spleen and liver. We also discussed your history of chronic colitis and previous medical tests.  YOUR PLAN: -CHRONIC DIARRHEA WITH ABDOMINAL PAIN AND COLONIC INFLAMMATION: Chronic diarrhea with abdominal pain and colonic inflammation can be caused by conditions like Crohn's disease, ulcerative colitis, or infections. We will provide you with a stool sample kit to test for infections. If your symptoms persist, we may refer you to Dr. Stacia for further evaluation. For pain management, take ibuprofen  400 mg, 2-3 times daily for 3-4 days.  -LEFT INGUINAL HERNIA: A left inguinal hernia is a condition where tissue pushes through a weak spot in the abdominal muscles. Your pain may be more related to colonic inflammation than the hernia itself. We will monitor your symptoms and consider further evaluation if the pain persists or worsens.  -SPLENOMEGALY AND HEPATOMEGALY WITH ABNORMAL LIVER ENZYMES: Splenomegaly and hepatomegaly mean that your spleen and liver are enlarged, which can be due to various causes, including past alcohol use. Your liver enzymes are abnormal, so we will monitor your liver function and follow up in a couple of months. Continue to abstain from alcohol, and we will discuss potential causes, including your past alcohol use and weight.  INSTRUCTIONS: Please provide a stool sample using the kit we gave you to test for infections. Take ibuprofen  400 mg, 2-3 times daily for 3-4 days to manage your pain. If your symptoms persist, we may refer you to Dr. Stacia for further evaluation. We will monitor your liver function and follow up in a couple of months. Continue to abstain from alcohol.

## 2024-08-09 NOTE — Assessment & Plan Note (Signed)
 Recurrent diarrhea with abdominal pain and colonic inflammation is noted on CT. Differential diagnosis includes Crohn's disease, ulcerative colitis, or an infectious cause. Provide a stool sample kit to test for GI pathogen panel and C. difficile. Consider referral to Dr. Stacia if symptoms persist for evaluation of Crohn's or ulcerative colitis. Recommend ibuprofen  400 mg, 2-3 times daily for 3-4 days for pain management.

## 2024-08-09 NOTE — Assessment & Plan Note (Signed)
 Splenomegaly and hepatomegaly are present with elevated AST and low platelets. Hepatitis workups are normal. Possible etiology includes alcohol use or other causes. Monitor liver function with follow-up in a couple of months. Advised continued abstinence from alcohol.

## 2024-08-10 ENCOUNTER — Inpatient Hospital Stay: Admitting: Student in an Organized Health Care Education/Training Program

## 2024-08-10 LAB — GI PROFILE, STOOL, PCR

## 2024-08-10 LAB — C DIFFICILE TOXINS A+B W/RFLX

## 2024-08-11 ENCOUNTER — Telehealth: Payer: Self-pay

## 2024-08-11 LAB — GI PROFILE, STOOL, PCR

## 2024-08-11 LAB — SPECIMEN STATUS REPORT

## 2024-08-11 NOTE — Telephone Encounter (Signed)
 Looks like some of the labs were canceled and some are not back yet. Asking lab to look into reason why.

## 2024-08-11 NOTE — Telephone Encounter (Signed)
 Copied from CRM #[id number]. Topic: Clinical - Lab/Test Results >> Aug 11, 2024  1:22 PM Franky GRADE wrote: Reason for CRM: Patient was notified through MyChart saying he has new lab results available; however, he would like to speak with Dr.Vincent of the medical assistant to review the results.

## 2024-08-12 ENCOUNTER — Ambulatory Visit: Payer: Self-pay | Admitting: Student in an Organized Health Care Education/Training Program

## 2024-08-14 LAB — C DIFFICILE TOXINS A+B W/RFLX: C difficile Toxins A+B, EIA: NEGATIVE

## 2024-08-14 LAB — SPECIMEN STATUS REPORT

## 2024-08-14 LAB — C DIFFICILE, CYTOTOXIN B

## 2024-08-23 ENCOUNTER — Encounter (HOSPITAL_BASED_OUTPATIENT_CLINIC_OR_DEPARTMENT_OTHER): Payer: Self-pay | Admitting: Emergency Medicine

## 2024-08-23 ENCOUNTER — Emergency Department (HOSPITAL_BASED_OUTPATIENT_CLINIC_OR_DEPARTMENT_OTHER)

## 2024-08-23 ENCOUNTER — Emergency Department (HOSPITAL_BASED_OUTPATIENT_CLINIC_OR_DEPARTMENT_OTHER)
Admission: EM | Admit: 2024-08-23 | Discharge: 2024-08-23 | Disposition: A | Discharge status: Home / Self Care | Attending: Emergency Medicine | Admitting: Emergency Medicine

## 2024-08-23 ENCOUNTER — Other Ambulatory Visit: Payer: Self-pay

## 2024-08-23 DIAGNOSIS — R001 Bradycardia, unspecified: Secondary | ICD-10-CM | POA: Insufficient documentation

## 2024-08-23 DIAGNOSIS — L299 Pruritus, unspecified: Secondary | ICD-10-CM | POA: Diagnosis not present

## 2024-08-23 DIAGNOSIS — Z79899 Other long term (current) drug therapy: Secondary | ICD-10-CM | POA: Insufficient documentation

## 2024-08-23 DIAGNOSIS — L509 Urticaria, unspecified: Secondary | ICD-10-CM | POA: Diagnosis not present

## 2024-08-23 DIAGNOSIS — R161 Splenomegaly, not elsewhere classified: Secondary | ICD-10-CM | POA: Diagnosis not present

## 2024-08-23 DIAGNOSIS — G8929 Other chronic pain: Secondary | ICD-10-CM | POA: Diagnosis not present

## 2024-08-23 DIAGNOSIS — R9431 Abnormal electrocardiogram [ECG] [EKG]: Secondary | ICD-10-CM | POA: Diagnosis not present

## 2024-08-23 DIAGNOSIS — R197 Diarrhea, unspecified: Secondary | ICD-10-CM | POA: Insufficient documentation

## 2024-08-23 DIAGNOSIS — R1032 Left lower quadrant pain: Secondary | ICD-10-CM | POA: Insufficient documentation

## 2024-08-23 DIAGNOSIS — R109 Unspecified abdominal pain: Secondary | ICD-10-CM | POA: Diagnosis not present

## 2024-08-23 LAB — CBC WITH DIFFERENTIAL/PLATELET
Abs Immature Granulocytes: 0.03 K/uL (ref 0.00–0.07)
Basophils Absolute: 0 K/uL (ref 0.0–0.1)
Basophils Relative: 0 %
Eosinophils Absolute: 0.2 K/uL (ref 0.0–0.5)
Eosinophils Relative: 2 %
HCT: 41 % (ref 39.0–52.0)
Hemoglobin: 14.8 g/dL (ref 13.0–17.0)
Immature Granulocytes: 0 %
Lymphocytes Relative: 18 %
Lymphs Abs: 1.6 K/uL (ref 0.7–4.0)
MCH: 30.6 pg (ref 26.0–34.0)
MCHC: 36.1 g/dL — ABNORMAL HIGH (ref 30.0–36.0)
MCV: 84.9 fL (ref 80.0–100.0)
Monocytes Absolute: 0.5 K/uL (ref 0.1–1.0)
Monocytes Relative: 6 %
Neutro Abs: 6.5 K/uL (ref 1.7–7.7)
Neutrophils Relative %: 74 %
Platelets: 129 K/uL — ABNORMAL LOW (ref 150–400)
RBC: 4.83 MIL/uL (ref 4.22–5.81)
RDW: 13.9 % (ref 11.5–15.5)
WBC: 8.9 K/uL (ref 4.0–10.5)
nRBC: 0 % (ref 0.0–0.2)

## 2024-08-23 LAB — COMPREHENSIVE METABOLIC PANEL WITH GFR
ALT: 45 U/L — ABNORMAL HIGH (ref 0–44)
AST: 48 U/L — ABNORMAL HIGH (ref 15–41)
Albumin: 4.7 g/dL (ref 3.5–5.0)
Alkaline Phosphatase: 77 U/L (ref 38–126)
Anion gap: 15 (ref 5–15)
BUN: 9 mg/dL (ref 6–20)
CO2: 24 mmol/L (ref 22–32)
Calcium: 10.2 mg/dL (ref 8.9–10.3)
Chloride: 101 mmol/L (ref 98–111)
Creatinine, Ser: 0.67 mg/dL (ref 0.61–1.24)
GFR, Estimated: >60 mL/min (ref >60)
Glucose, Bld: 133 mg/dL — ABNORMAL HIGH (ref 70–99)
Potassium: 3.3 mmol/L — ABNORMAL LOW (ref 3.5–5.1)
Sodium: 139 mmol/L (ref 135–145)
Total Bilirubin: 0.6 mg/dL (ref 0.0–1.2)
Total Protein: 7.4 g/dL (ref 6.5–8.1)

## 2024-08-23 LAB — URINALYSIS, ROUTINE W REFLEX MICROSCOPIC
Bilirubin Urine: NEGATIVE
Glucose, UA: NEGATIVE mg/dL
Hgb urine dipstick: NEGATIVE
Ketones, ur: NEGATIVE mg/dL
Leukocytes,Ua: NEGATIVE
Nitrite: NEGATIVE
Protein, ur: NEGATIVE mg/dL
Specific Gravity, Urine: 1.009 (ref 1.005–1.030)
pH: 6 (ref 5.0–8.0)

## 2024-08-23 LAB — LIPASE, BLOOD: Lipase: 31 U/L (ref 11–51)

## 2024-08-23 MED ORDER — FAMOTIDINE IN NACL 20-0.9 MG/50ML-% IV SOLN
20.0000 mg | Freq: Once | INTRAVENOUS | Status: AC
  Administered 2024-08-23: 20 mg via INTRAVENOUS
  Filled 2024-08-23: qty 50

## 2024-08-23 MED ORDER — ONDANSETRON HCL 4 MG/2ML IJ SOLN
4.0000 mg | Freq: Once | INTRAMUSCULAR | Status: AC
  Administered 2024-08-23: 4 mg via INTRAVENOUS
  Filled 2024-08-23: qty 2

## 2024-08-23 MED ORDER — SODIUM CHLORIDE 0.9 % IV BOLUS
1000.0000 mL | Freq: Once | INTRAVENOUS | Status: AC
  Administered 2024-08-23: 1000 mL via INTRAVENOUS

## 2024-08-23 MED ORDER — EPINEPHRINE 0.3 MG/0.3ML IJ SOAJ
INTRAMUSCULAR | Status: AC
  Filled 2024-08-23: qty 0.3

## 2024-08-23 MED ORDER — IOHEXOL 300 MG/ML  SOLN
100.0000 mL | Freq: Once | INTRAMUSCULAR | Status: AC | PRN
  Administered 2024-08-23: 100 mL via INTRAVENOUS

## 2024-08-23 MED ORDER — MORPHINE SULFATE (PF) 4 MG/ML IV SOLN
4.0000 mg | Freq: Once | INTRAVENOUS | Status: AC
  Administered 2024-08-23: 4 mg via INTRAVENOUS
  Filled 2024-08-23: qty 1

## 2024-08-23 NOTE — ED Notes (Signed)
 Patient transported to CT

## 2024-08-23 NOTE — ED Notes (Signed)
 Reviewed AVS/discharge instructions with patient. Time allotted for and all questions answered. Patient is agreeable for d/c and escorted to ED exit by staff.

## 2024-08-23 NOTE — Discharge Instructions (Addendum)
 Your laboratories also within normal limits today.  Please follow-up with your gastroenterologist in order to obtain a colonoscopy for further evaluation of your ongoing abdominal pain.

## 2024-08-23 NOTE — ED Notes (Addendum)
 Pt HR dropped to 39. Pt assessed. Pt bears down and holds breath with pain, HR drops. HR recovered when this RN was in room with patient. Pt beared down when in room and HR dropped but quickly recovered. PT denies dizziness, reports not aware of any bradycardia issues. RN Tillman made aware, she is Soil scientist. Pt shows no s/s of distress, denies CP.This RN spoke with EDP Goldston who looked at pt rhythm

## 2024-08-23 NOTE — ED Notes (Signed)
 Patient called out for pain when he presses on abdomen. Provider notified of pain.

## 2024-08-23 NOTE — ED Provider Notes (Signed)
 [place] EMERGENCY DEPARTMENT AT [facility] Provider Note   CSN: [id number] Arrival date & time: 08/23/24  0930     Patient presents with: Allergic Reaction and Diarrhea   Alan Sanders is a 53 y.o. male.   53 year old male with a past medical history of chronic left lower quadrant pain presents to the ED via EMS with sudden onset of abdominal pain.  According to patient he got off work this morning, when suddenly he began to feel sharp stabbing pain to the left lower quadrant without any radiation.  He did call EMS, EMS did provide him with some fentanyl  which he reports he is allergic to.  After receiving this medication he began to have itching, felt like his throat was closing.  When he arrived to the ED he was hemodynamically stable without any hypoxia, no wheezing.  He did not take any other medication for improvement in symptoms.  This does seem to be a recurrent problem for patient.  He is unsure where he was diagnosed with first.  He does not have a prior gastroenterologist that he sees.  He does report having ongoing diarrhea, no blood in his stool that he noticed.  Denies any fever, nausea, vomiting, shortness of breath.  The history is provided by the patient.  Allergic Reaction Presenting symptoms: itching   Diarrhea Associated symptoms: abdominal pain   Associated symptoms: no chills, no fever and no vomiting        Prior to Admission medications   Medication Sig Start Date End Date Taking? Authorizing Provider  dicyclomine  (BENTYL ) 20 MG tablet TAKE 1 TABLET (20 MG TOTAL) BY MOUTH 2 (TWO) TIMES DAILY AS NEEDED FOR SPASMS. 05/30/24   Stacia Glendia BRAVO, MD  DULoxetine  (CYMBALTA ) 30 MG capsule Take 1 capsule (30 mg total) by mouth daily. 03/01/24   Jerrell Cleatus Ned, MD  gabapentin  (NEURONTIN ) 300 MG capsule TAKE 1 CAPSULE BY MOUTH THREE TIMES A DAY 08/05/24   Jerrell Cleatus Ned, MD  methocarbamol  (ROBAXIN ) 750 MG tablet TAKE 1 TABLET (750 MG TOTAL) BY  MOUTH DAILY AS NEEDED FOR MUSCLE SPASMS 07/19/24   Jerrell Cleatus Ned, MD  omeprazole  (PRILOSEC) 20 MG capsule Take 1 capsule (20 mg total) by mouth daily. 06/23/24   Horton, Charmaine FALCON, MD  oxyCODONE -acetaminophen  (PERCOCET/ROXICET) 5-325 MG tablet Take 1 tablet by mouth every 6 (six) hours as needed for severe pain (pain score 7-10). 08/04/24   Myriam Dorn BROCKS, PA  pantoprazole  (PROTONIX ) 40 MG tablet Take 1 tablet (40 mg total) by mouth daily. 03/01/24 02/24/25  Stacia Glendia BRAVO, MD  SUMAtriptan  (IMITREX ) 25 MG tablet Take 1 tablet (25 mg total) by mouth every 2 (two) hours as needed for migraine. May repeat in 2 hours if headache persists or recurs. 07/22/24   Jerrell Cleatus Ned, MD    Allergies: Fentanyl  and Hydromorphone     Review of Systems  Constitutional:  Negative for chills and fever.  Respiratory:  Negative for shortness of breath.   Cardiovascular:  Negative for chest pain.  Gastrointestinal:  Positive for abdominal pain and diarrhea. Negative for nausea and vomiting.  Skin:  Positive for itching.  All other systems reviewed and are negative.   Updated Vital Signs BP 113/84 (BP Location: Right Arm)   Pulse 80   Temp 97.7 F (36.5 C) (Oral)   Resp 18   Wt 94.3 kg   SpO2 99%   BMI 26.71 kg/m   Physical Exam Vitals and nursing note reviewed.  Constitutional:  Appearance: Normal appearance.  HENT:     Head: Normocephalic and atraumatic.     Mouth/Throat:     Mouth: Mucous membranes are moist.     Comments: Oropharynx is clear without any edema. Cardiovascular:     Rate and Rhythm: Normal rate.  Pulmonary:     Effort: Pulmonary effort is normal.     Breath sounds: No wheezing.     Comments: No wheezing on exam to suggest allergic reaction. Abdominal:     General: Abdomen is flat.     Tenderness: There is abdominal tenderness.     Comments: Mild tenderness to palpation along the left lower quadrant.  Musculoskeletal:     Cervical back: Normal range  of motion and neck supple.  Skin:    General: Skin is warm and dry.  Neurological:     Mental Status: He is alert and oriented to person, place, and time.     (all labs ordered are listed, but only abnormal results are displayed) Labs Reviewed  CBC WITH DIFFERENTIAL/PLATELET - Abnormal; Notable for the following components:      Result Value   MCHC 36.1 (*)    Platelets 129 (*)    All other components within normal limits  COMPREHENSIVE METABOLIC PANEL WITH GFR - Abnormal; Notable for the following components:   Potassium 3.3 (*)    Glucose, Bld 133 (*)    AST 48 (*)    ALT 45 (*)    All other components within normal limits  LIPASE, BLOOD  URINALYSIS, ROUTINE W REFLEX MICROSCOPIC    EKG: None  Radiology: CT ABDOMEN PELVIS W CONTRAST Result Date: 08/23/2024 CLINICAL DATA:  53 year old male with abdominal pain and diarrhea. Acute hives and itching after given medication in route. EXAM: CT ABDOMEN AND PELVIS WITH CONTRAST TECHNIQUE: Multidetector CT imaging of the abdomen and pelvis was performed using the standard protocol following bolus administration of intravenous contrast. RADIATION DOSE REDUCTION: This exam was performed according to the departmental dose-optimization program which includes automated exposure control, adjustment of the mA and/or kV according to patient size and/or use of iterative reconstruction technique. CONTRAST:  OMNIPAQUE  IOHEXOL  300 MG/ML  SOLN COMPARISON:  CT Abdomen and Pelvis 08/04/2024. FINDINGS: Lower chest: Normal heart size. No pericardial effusion. Stable lung volumes, mild bibasilar atelectasis. No pleural effusion. Hepatobiliary: Negative liver and gallbladder. No bile duct dilatation. Pancreas: Negative. Spleen: Estimated splenic volume 698 mL (normal splenic volume range 83 - 412 mL). This is stable, nonspecific. No discrete splenic lesion. No perisplenic fluid. Adrenals/Urinary Tract: Normal adrenal glands. Kidneys appears stable and  negative, symmetric renal enhancement and contrast excretion 2 diminutive ureters. Diminutive and unremarkable bladder. No urinary calculus identified. Stomach/Bowel: Mild large bowel redundancy. Unremarkable colon and retrocecal appendix (series 2, image 57). No large bowel inflammation. Decompressed terminal ileum and no dilated small bowel. Mild retained gas and fluid in the stomach and proximal duodenum. Distal duodenum tapers. No pneumoperitoneum, free fluid, mesenteric inflammation identified. Vascular/Lymphatic: Major arterial structures, portal venous system in the abdomen and pelvis appear patent. Minimal atherosclerosis. Normal caliber abdominal aorta. No lymphadenopathy. Reproductive: Chronic right inguinal hernia repair with mesh is stable. Otherwise negative. Other: No pelvis free fluid. Musculoskeletal: No acute osseous abnormality identified. IMPRESSION: 1. No acute or inflammatory process identified in the abdomen or pelvis. Normal appendix. 2. Stable Splenomegaly, nonspecific. No superimposed lymphadenopathy or other related findings. Electronically Signed   By: VEAR Hurst M.D.   On: 08/23/2024 11:52     Procedures  Medications Ordered in the ED  EPINEPHrine  (EPI-PEN) 0.3 mg/0.3 mL injection (  Return to [facility] 08/23/24 0940)  sodium chloride  0.9 % bolus 1,000 mL (0 mLs Intravenous Stopped 08/23/24 1142)  ondansetron  (ZOFRAN ) injection 4 mg (4 mg Intravenous Given 08/23/24 1019)  morphine  (PF) 4 MG/ML injection 4 mg (4 mg Intravenous Given 08/23/24 1020)  famotidine  (PEPCID ) IVPB 20 mg premix (0 mg Intravenous Stopped 08/23/24 1043)  iohexol  (OMNIPAQUE ) 300 MG/ML solution 100 mL (100 mLs Intravenous Contrast Given 08/23/24 1126)  morphine  (PF) 4 MG/ML injection 4 mg (4 mg Intravenous Given 08/23/24 1312)                                    Medical Decision Making Amount and/or Complexity of Data Reviewed Labs: ordered. Radiology: ordered.  Risk Prescription drug management.   This  patient presents to the ED for concern of lower abdominal pain, this involves a number of treatment options, and is a complaint that carries with it a high risk of complications and morbidity.  The differential diagnosis includes reticulitis, inguinal hernia incarceration, fraction.  Co morbidities: Discussed in HPI  Brief History:  See HPI  EMR reviewed including pt PMHx, past surgical history and past visits to ER.   See HPI for more details   Lab Tests:  I ordered and independently interpreted labs.  The pertinent results include:    I personally reviewed all laboratory work and imaging. Metabolic panel without any acute abnormality specifically kidney function within normal limits and no significant electrolyte abnormalities. CBC without leukocytosis or significant anemia.  Imaging Studies:  CT abdomen and pelvis showed: IMPRESSION:  1. No acute or inflammatory process identified in the abdomen or  pelvis. Normal appendix.  2. Stable Splenomegaly, nonspecific. No superimposed lymphadenopathy  or other related findings.   Cardiac Monitoring:  The patient was maintained on a cardiac monitor.  I personally viewed and interpreted the cardiac monitored which showed an underlying rhythm of: Normal sinus rhythm. EKG non-ischemic   Medicines ordered:  I ordered medication including Zofran , morphine , Pepcid , bolus for symptomatic treatment Reevaluation of the patient after these medicines showed that the patient improved I have reviewed the patients home medicines and have made adjustments as needed  Reevaluation:  After the interventions noted above I re-evaluated patient and found that they have :improved  Social Determinants of Health:  The patient's social determinants of health were a factor in the care of this patient  Problem List / ED Course:  Patient here with recurrent symptoms of lower abdominal pain that have been ongoing for some time.  Also having ongoing  diarrhea.  Today she felt that the pain was sharp stabbing to the left lower quadrant.  He was concerned for diverticulitis that he had this in the past along with a left inguinal hernia.  On evaluation he is normotensive, heart rate is within normal limits.  No hypoxia, exam is benign. Extensive review of prior records do show multiple visits for the same complaint earlier this month.  He did follow-up with PCP who diagnosed him with colitis, a GI panel was sent off and he had a prior CT abdomen that did not show any acute findings.  On today's visit CBC is within normal limits.  CMP remarkable for some mild hypokalemia, consistent with likely his diarrhea and GI losses.  Afebrile here, no blood in his stool.  Given multiple rounds of  pain medication with improvement in his symptoms.  I do see where primary care would like patient to have colonoscopy.  He has not scheduled this at this time.  He continues to endorse pain along the left lower quadrant. I did see a review of the GI panel that he submitted to his PCP on September 19, the results are within normal limits then.  He probably would benefit from colonoscopy to rule out inflammatory bowel disease. Here he did have a couple episodes of bradycardia, especially whenever he bagels down.  I did do a GU exam I do not see testicular swelling, testicular pain, there has not been signs of incarceration of the hernia, or strangulation.  CT abdomen and pelvis was benign today as well.  I do feel that patient warrants further workup with specialist.  He is hemodynamically stable for discharge.  Return precautions discussed at length.   Dispostion:  After consideration of the diagnostic results and the patients response to treatment, I feel that the patent would benefit from close follow-up with gastroenterologist for colonoscopy.    Portions of this note were generated with Scientist, clinical (histocompatibility and immunogenetics). Dictation errors may occur despite best attempts at  proofreading.  Final diagnoses:  Chronic abdominal pain    ED Discharge Orders     None          Maureen Broad, PA-C 08/23/24 1455    Freddi Hamilton, MD 08/24/24 [phone]

## 2024-08-23 NOTE — ED Triage Notes (Signed)
 C/o LLQ pain w/ diarrhea. EMS states patient was given fent en route and broke out into full body hives and itching. Given 50 of Benadryl .

## 2024-08-24 ENCOUNTER — Telehealth: Payer: Self-pay | Admitting: Student in an Organized Health Care Education/Training Program

## 2024-08-24 NOTE — Telephone Encounter (Signed)
 Placed on your desk for review and sign

## 2024-08-24 NOTE — Telephone Encounter (Signed)
 Type of form received: FMLA Forms  Additional comments:   Received by: Fax  Form should be Faxed/mailed to: (address/ fax #) [phone]  Is patient requesting call for pickup: N/A  Form placed:  Labeled & placed in provider bin  Attach charge sheet.  Provider will determine charge.  Individual made aware of 3-5 business day turn around? N/A

## 2024-08-24 NOTE — Telephone Encounter (Signed)
 I need information please.  What dates is the patient requesting that this FMLA cover?  Which medical problem was the cause of the absences?

## 2024-08-25 DIAGNOSIS — Z0289 Encounter for other administrative examinations: Secondary | ICD-10-CM

## 2024-08-25 NOTE — Telephone Encounter (Signed)
 Form has been faxed and charge has been added to the system

## 2024-08-25 NOTE — Telephone Encounter (Signed)
 Called patient to ask for more information regarding what dates patient has been out of work, what is the cause / condition for absence(s)  No answer, LM to call back so we can confirm details.

## 2024-08-25 NOTE — Telephone Encounter (Signed)
 Thank you.  That information is very helpful.  Forms are completed.

## 2024-08-25 NOTE — Telephone Encounter (Signed)
 Patient called back, states was seen in ER Tuesday this week for abdominal pain and vomiting, notes this has been a frequent issue. He is requesting for FMLA to cover from Tuesday 08/23/2024 to October 21st when he sees the specialist. Patient reports has missed all of work this week but was provided a note by the ER stating he can be out this week but return Monday.

## 2024-08-29 NOTE — Telephone Encounter (Signed)
 Received forms a 2nd time by fax - I have re-faxed back to [employer] 10/6 @ 8:45am

## 2024-09-06 ENCOUNTER — Encounter: Payer: Self-pay | Admitting: Student in an Organized Health Care Education/Training Program

## 2024-09-06 ENCOUNTER — Ambulatory Visit: Admitting: Student in an Organized Health Care Education/Training Program

## 2024-09-13 ENCOUNTER — Ambulatory Visit: Payer: Self-pay | Admitting: Nurse Practitioner

## 2024-09-13 ENCOUNTER — Encounter: Payer: Self-pay | Admitting: Nurse Practitioner

## 2024-09-13 ENCOUNTER — Encounter: Payer: Self-pay | Admitting: Student in an Organized Health Care Education/Training Program

## 2024-09-13 ENCOUNTER — Ambulatory Visit: Admitting: Student in an Organized Health Care Education/Training Program

## 2024-09-13 ENCOUNTER — Other Ambulatory Visit

## 2024-09-13 ENCOUNTER — Ambulatory Visit: Admitting: Nurse Practitioner

## 2024-09-13 VITALS — BP 128/70 | HR 91 | Ht 74.0 in | Wt 208.0 lb

## 2024-09-13 VITALS — BP 120/70 | HR 94 | Ht 73.0 in | Wt 208.0 lb

## 2024-09-13 DIAGNOSIS — R7989 Other specified abnormal findings of blood chemistry: Secondary | ICD-10-CM

## 2024-09-13 DIAGNOSIS — D696 Thrombocytopenia, unspecified: Secondary | ICD-10-CM | POA: Diagnosis not present

## 2024-09-13 DIAGNOSIS — F339 Major depressive disorder, recurrent, unspecified: Secondary | ICD-10-CM | POA: Diagnosis not present

## 2024-09-13 DIAGNOSIS — R1032 Left lower quadrant pain: Secondary | ICD-10-CM

## 2024-09-13 DIAGNOSIS — K529 Noninfective gastroenteritis and colitis, unspecified: Secondary | ICD-10-CM | POA: Diagnosis not present

## 2024-09-13 DIAGNOSIS — K7460 Unspecified cirrhosis of liver: Secondary | ICD-10-CM | POA: Diagnosis not present

## 2024-09-13 DIAGNOSIS — Z860101 Personal history of adenomatous and serrated colon polyps: Secondary | ICD-10-CM

## 2024-09-13 DIAGNOSIS — K760 Fatty (change of) liver, not elsewhere classified: Secondary | ICD-10-CM

## 2024-09-13 DIAGNOSIS — K2270 Barrett's esophagus without dysplasia: Secondary | ICD-10-CM | POA: Diagnosis not present

## 2024-09-13 DIAGNOSIS — K589 Irritable bowel syndrome without diarrhea: Secondary | ICD-10-CM | POA: Diagnosis not present

## 2024-09-13 DIAGNOSIS — K769 Liver disease, unspecified: Secondary | ICD-10-CM | POA: Diagnosis not present

## 2024-09-13 DIAGNOSIS — R161 Splenomegaly, not elsewhere classified: Secondary | ICD-10-CM

## 2024-09-13 LAB — CBC WITH DIFFERENTIAL/PLATELET
Basophils Absolute: 0.1 K/uL (ref 0.0–0.1)
Basophils Relative: 0.9 % (ref 0.0–3.0)
Eosinophils Absolute: 0.3 K/uL (ref 0.0–0.7)
Eosinophils Relative: 3 % (ref 0.0–5.0)
HCT: 48.5 % (ref 39.0–52.0)
Hemoglobin: 16.8 g/dL (ref 13.0–17.0)
Lymphocytes Relative: 21.6 % (ref 12.0–46.0)
Lymphs Abs: 1.8 K/uL (ref 0.7–4.0)
MCHC: 34.6 g/dL (ref 30.0–36.0)
MCV: 85.7 fl (ref 78.0–100.0)
Monocytes Absolute: 0.5 K/uL (ref 0.1–1.0)
Monocytes Relative: 6.4 % (ref 3.0–12.0)
Neutro Abs: 5.6 K/uL (ref 1.4–7.7)
Neutrophils Relative %: 68.1 % (ref 43.0–77.0)
Platelets: 142 K/uL — ABNORMAL LOW (ref 150.0–400.0)
RBC: 5.66 Mil/uL (ref 4.22–5.81)
RDW: 13.8 % (ref 11.5–15.5)
WBC: 8.3 K/uL (ref 4.0–10.5)

## 2024-09-13 LAB — COMPREHENSIVE METABOLIC PANEL WITH GFR
ALT: 83 U/L — ABNORMAL HIGH (ref 0–53)
AST: 49 U/L — ABNORMAL HIGH (ref 0–37)
Albumin: 5.4 g/dL — ABNORMAL HIGH (ref 3.5–5.2)
Alkaline Phosphatase: 85 U/L (ref 39–117)
BUN: 10 mg/dL (ref 6–23)
CO2: 30 meq/L (ref 19–32)
Calcium: 9.8 mg/dL (ref 8.4–10.5)
Chloride: 98 meq/L (ref 96–112)
Creatinine, Ser: 0.77 mg/dL (ref 0.40–1.50)
GFR: 102.28 mL/min (ref >60.00)
Glucose, Bld: 120 mg/dL — ABNORMAL HIGH (ref 70–99)
Potassium: 3.6 meq/L (ref 3.5–5.1)
Sodium: 138 meq/L (ref 135–145)
Total Bilirubin: 0.7 mg/dL (ref 0.2–1.2)
Total Protein: 8.3 g/dL (ref 6.0–8.3)

## 2024-09-13 LAB — PROTIME-INR
INR: 1 ratio (ref 0.8–1.0)
Prothrombin Time: 11 s (ref 9.6–13.1)

## 2024-09-13 LAB — FERRITIN: Ferritin: 133.8 ng/mL (ref 22.0–322.0)

## 2024-09-13 LAB — SEDIMENTATION RATE: Sed Rate: 7 mm/h (ref 0–20)

## 2024-09-13 LAB — C-REACTIVE PROTEIN: CRP: <0.5 mg/dL (ref 0.5–20.0)

## 2024-09-13 MED ORDER — DICYCLOMINE HCL 20 MG PO TABS
20.0000 mg | ORAL_TABLET | Freq: Three times a day (TID) | ORAL | 5 refills | Status: AC
Start: 2024-09-13 — End: ?

## 2024-09-13 MED ORDER — DULOXETINE HCL 30 MG PO CPEP: 30.0000 mg | ORAL_CAPSULE | Freq: Every day | ORAL | 3 refills | Status: AC

## 2024-09-13 NOTE — Progress Notes (Signed)
 Established Patient Office Visit  Subjective   Patient ID: Alan Sanders, male    DOB: 08-25-1971  Age: 53 y.o. MRN: [id number]  Chief Complaint  Patient presents with   Follow-up    4wk follow up. Patient would like RSV vaccine    HPI  Discussed the use of AI scribe software for clinical note transcription with the patient, who gave verbal consent to proceed.  History of Present Illness Alan Sanders is a 53 year old male with irritable bowel syndrome who presents with persistent diarrhea and abdominal pain.  He experiences ongoing diarrhea, with three to four bowel movements occurring after meals, particularly after consuming certain foods like stuffing and ham. He uses over-the-counter anti-diarrheal medication, described as green gel pills, before meals to manage symptoms, but often forgets to take them consistently.  He experiences abdominal pain in the lower abdomen. He has been to the hospital for this pain, but no specific cause was identified. Stress might be a contributing factor. He is currently on duloxetine  for stress, depression, and anxiety.  He has a history of liver disease and an enlarged spleen. He used to drink alcohol a long time ago. He has abstained from alcohol, especially since his daughter's boyfriend, who is recovering from alcoholism, is around.  He reports frequent urination, sometimes needing to urinate again shortly after voiding. A previous evaluation showed his bladder was emptying properly. He recently started taking a vitamin supplement recommended by a pharmacist, but it has not yet improved his symptoms.  He is currently taking duloxetine , a medication for his feet, a white pill for pain as needed, and a blue pill for spasms as needed. He also takes allergy medication regularly. No recent alcohol use.     Objective:     BP 128/70 (BP Location: Right Arm, Patient Position: Sitting, Cuff Size: Normal)   Pulse 91   Ht 6' 2 (1.88 m)    Wt 208 lb (94.3 kg)   SpO2 99%   BMI 26.71 kg/m   Physical Exam  Gen: Chronically ill-appearing man Neck: Normal thyroid , no nodules or adenopathy Heart: Regular, no murmur Lungs: Unlabored, clear throughout Abd: Protuberant, tympanic to percussion throughout, small umbilical hernia that is easily reducible, Ext: Sarcopenia, no edema, normal joints Psych: Mildly depressed appearing, linear thoughts, not sedated, normal speech    Assessment & Plan:   Problem List Items Addressed This Visit       High   Depression, recurrent (Chronic)   Chronic issue, not well-controlled.  Refill history suggests poor adherence with the duloxetine  medication.  We talked about increasing the dose, but at the 30 mg has not been filled since April.  I sent in a 90-day supply of duloxetine  30 mg and encouraged him to take this once daily.  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      Relevant Medications   DULoxetine  (CYMBALTA ) 30 MG capsule   Chronic liver disease and cirrhosis (HCC) (Chronic)   Relatively new diagnosis, seems to be a developing situation.  Patient has a history of alcohol use, he denies much current use.  He has elevated liver enzymes with AST higher than ALT.  On cross-sectional imaging he has splenomegaly, and he has thrombocytopenia.  On exam he has gynecomastia and sarcopenia.  No ascites.  No encephalopathy or coagulopathy.  To reduce his risk of progressive liver disease, I strongly  encouraged him to discontinue all alcohol use.  He has consultation today with GI, I would like them to consider endoscopy for variceal screening.  He has had negative hepatitis B and C testing 2 years ago.  No signs of volume overload, so I do not think there is a need for diuretics.          Low   IBS (irritable bowel syndrome) - Primary (Chronic)   Patient continues to have intermittent  abdominal discomfort and diarrhea type symptoms.  In the past he has had negative colonoscopies.  We did stool studies last month which were negative.  He had a CT in the emergency department that was negative.  He has a number of negative social determinants of health, external stressors, and active depression.  I think he is at risk for functional GI disorder like IBS.  I have refilled dicyclomine  and asked him to try this antispasmodic.  I think rifaximin might be helpful as well for his symptoms.  He has consultation today with GI, I suspect they will consider a repeat colonoscopy to evaluate these diarrheal symptoms.      Relevant Medications   dicyclomine  (BENTYL ) 20 MG tablet    Return in about 3 months (around 12/14/2024).    Alan Debby Specking, MD

## 2024-09-13 NOTE — Assessment & Plan Note (Signed)
 Chronic issue, not well-controlled.  Refill history suggests poor adherence with the duloxetine  medication.  We talked about increasing the dose, but at the 30 mg has not been filled since April.  I sent in a 90-day supply of duloxetine  30 mg and encouraged him to take this once daily.  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

## 2024-09-13 NOTE — Assessment & Plan Note (Signed)
 Patient continues to have intermittent abdominal discomfort and diarrhea type symptoms.  In the past he has had negative colonoscopies.  We did stool studies last month which were negative.  He had a CT in the emergency department that was negative.  He has a number of negative social determinants of health, external stressors, and active depression.  I think he is at risk for functional GI disorder like IBS.  I have refilled dicyclomine  and asked him to try this antispasmodic.  I think rifaximin might be helpful as well for his symptoms.  He has consultation today with GI, I suspect they will consider a repeat colonoscopy to evaluate these diarrheal symptoms.

## 2024-09-13 NOTE — Patient Instructions (Addendum)
  VISIT SUMMARY: Today, we discussed your ongoing symptoms, including diarrhea, abdominal pain, and frequent urination. We also reviewed your history of liver disease and anxiety. We have made some adjustments to your medications and provided recommendations to help manage your symptoms.  YOUR PLAN: -CHRONIC LIVER DISEASE WITH CIRRHOSIS AND SPLENOMEGALY: Cirrhosis is a condition where the liver is severely scarred, often due to past alcohol use, and splenomegaly is an enlarged spleen often secondary to liver disease. We will refer you to a gastrointestinal specialist for further evaluation and to check for esophageal varices, which are swollen veins in the esophagus that can occur with liver disease. Please continue to avoid alcohol.  -IRRITABLE BOWEL SYNDROME AND CHRONIC COLITIS WITH DIARRHEA: Irritable bowel syndrome (IBS) is a disorder that affects the large intestine, causing symptoms like diarrhea and abdominal pain, which can be triggered by certain foods and stress. Chronic colitis is long-term inflammation of the colon. We have re-prescribed Bentyl  to help with abdominal cramps and recommend taking over-the-counter anti-diarrheal medication before meals.  -ANXIETY AND DEPRESSION: Anxiety and depression are mental health conditions that can be exacerbated by stress. You are currently taking duloxetine , and we have decided to increase your dose to help manage your symptoms better.  -URINARY FREQUENCY: Frequent urination can be bothersome and may not always indicate a serious problem. Your previous exam showed that your bladder was emptying properly. We will continue to monitor this issue, but no new medications are being prescribed at this time.  INSTRUCTIONS: Please follow up with the gastrointestinal specialist for your liver disease evaluation and esophageal varices assessment. Continue to avoid alcohol and take your medications as prescribed. If you have any new or worsening symptoms, please  contact our office.

## 2024-09-13 NOTE — Progress Notes (Signed)
 09/13/2024 Alan Sanders [id number] [birth year]   Chief Complaint: Large liver, LLQ pain, chronic diarrhea  History of Present Illness: Alan Sanders is a 53 year old male with a past medical history of anxiety, depression, splenomegaly, thrombocytopenia, Barrett's esophagus, IBS-D and colon polyps.  He was last seen in office by Dr. Stacia 03/01/2024 due to having chronic abdominal pain and diarrhea for several years. He has undergone extensive workup including multiple CT scans, colonoscopies, blood tests, and stool tests, all of which have returned normal results.  He was diagnosed with diarrhea predominant IBS and a FODMAP and gluten-free diet were discussed and he was started on Elavil .  He was also instructed to take Imodium  and Bentyl  as needed.  He was also instructed continue pantoprazole  40 mg daily for history of Barrett's esophagus with plans to schedule a surveillance EGD in 2027.  He presented to the ED 08/04/2024 with vomiting, loose stools and LLQ pain.  Labs in the ED showed a WBC count of 5.7.  Hemoglobin 14.3.  Platelets 117.  BUN 9.  Creatinine 0.80.  Total bili 0.6.  Alk phos 90.  AST 67.  ALT 32.  Lipase 33.  CTAP identified hepatic steatosis, hepatomegaly and splenomegaly.  Submucosal fat along the ascending colon and terminal ileum suggestive of chronic inflammatory changes and a few sigmoid diverticula without diverticulitis and no evidence of bowel wall thickening or obstruction.  He was seen by his PCP and stool studies were ordered.  C. difficile toxin A/B and GI pathogen panel were negative.   He presented back to the ED 08/23/2024 with sudden onset LLQ pain.  WBC 8.9.  Hemoglobin 14.8.  Platelets 129.  Potassium 3.3.  BUN 9.  Creatinine 0.67.  Total bili 0.6.  Alk phos 77.  AST 48.  ALT 45.  Albumin 4.7.  Lipase 31.  He received fentanyl  per EMS and then shortly after developed itching and felt like his throat was closing.  When he arrived to the ED he was  hemodynamically stable without clinical evidence of respiratory distress.  He received Zofran , Morphine  and Pepcid  and his symptoms stabilized.  CTAP with contrast showed a negative liver and gallbladder without biliary ductal dilatation and stable splenomegaly.  He was discharged home with recommendations for further GI evaluation and for consideration for a diagnostic colonoscopy to rule out IBD.  His clinical status was stable and he was discharged home.  I received a message from his primary care physician Dr. Cleatus Specking requesting consideration for an EGD to survey for esophageal/gastric varices in setting of suspected cirrhosis and to manage his chronic diarrhea.  Alan Sanders endorses having chronic loose stools, he passes 3-4 nonbloody loose stools daily.  He cannot recall when he last passed a solid stool.  No bloody or black stools.  He has intermittent episodes of LLQ pain, last episode occurred 08/23/2024 as noted above.  No further LLQ pain since then.  He vomited nonbloody clear emesis x 2 to 3 episodes associated with the episodes of LLQ pain 9/11 and 9/30 without further recurrence.  He stated he only vomits when he has severe LLQ pain.  Has taken Pepto-Bismol in the past for diarrhea which was ineffective.  His most recent colonoscopy 12/2023 identified 5 polyps removed from the colon, mild diverticulosis to the descending colon and no evidence of colitis.  The patient stated having the same loose stool pattern and intermittent LLQ pain at the time of this colonoscopy.  He stated his diarrhea  has not worsened since then.  He drinks 3 red bull drinks 5 days weekly when he works the night shift as a Electrical engineer at [employer].  He endorse having a significant stress level as he and his wife are undergoing divorce process.  He has a history of GERD and Barrett's esophagus.  He has infrequent heartburn on Omeprazole  20 mg once daily.  He occasionally takes Advil  for migraine  headaches and aches and pains.  His most recent EGD was 02/23/2023 which identified Barrett's mucosa without dysplasia, gastric biopsy showed reactive/reparative changes without evidence of H. pylori and duodenal biopsies were negative for celiac disease. He was advised to repeat a surveillance EGD in 3 years.  In review of his epic lab results, he has had thrombocytopenia since 09/10/2009 and hepatic steatosis with splenomegaly initially identified per RUQ sonogram 06/10/2010.  As noted above, his most recent CTAP imaging 08/04/2024 showed mild hepatic steatosis and splenomegaly.  Repeat CTAP 08/23/2024 noted the liver was unremarkable and stable splenomegaly.  He was a heavy drinker in his early 81s to 30s.  Infrequent alcohol use age 64 current age of 28.  He stated his last alcohol intake was 1 year ago.  No drug use.     Latest Ref Rng & Units 08/23/2024   10:35 AM 08/04/2024    6:22 PM 07/11/2024    3:32 AM  CBC  WBC 4.0 - 10.5 K/uL 8.9  5.7  7.8   Hemoglobin 13.0 - 17.0 g/dL 85.1  85.6  84.2   Hematocrit 39.0 - 52.0 % 41.0  40.6  44.9   Platelets 150 - 400 K/uL 129  117  131        Latest Ref Rng & Units 08/23/2024   10:35 AM 08/04/2024    4:38 PM 07/11/2024    3:32 AM  CMP  Glucose 70 - 99 mg/dL 866  835  93   BUN 6 - 20 mg/dL 9  9  10    Creatinine 0.61 - 1.24 mg/dL 9.32  9.19  9.04   Sodium 135 - 145 mmol/L 139  139  139   Potassium 3.5 - 5.1 mmol/L 3.3  4.5  3.9   Chloride 98 - 111 mmol/L 101  102  101   CO2 22 - 32 mmol/L 24  22  25    Calcium  8.9 - 10.3 mg/dL 89.7  9.9  89.4   Total Protein 6.5 - 8.1 g/dL 7.4  8.0  7.8   Total Bilirubin 0.0 - 1.2 mg/dL 0.6  0.6  0.4   Alkaline Phos 38 - 126 U/L 77  90  95   AST 15 - 41 U/L 48  67  65   ALT 0 - 44 U/L 45  32  52     MELD 3.0: 6 at 07/08/2024  5:32 AM MELD-Na: 7 at 07/08/2024  5:32 AM Calculated from: Serum Creatinine: 0.9 mg/dL (Using min of 1 mg/dL) at 1/84/7974  4:67 AM Serum Sodium: 137 mmol/L at 07/08/2024  5:32 AM Total  Bilirubin: 1.1 mg/dL at 1/84/7974  7:61 AM Serum Albumin: 5.2 g/dL (Using max of 3.5 g/dL) at 1/84/7974  7:61 AM INR(ratio): 0.9 (Using min of 1) at 07/08/2024  2:38 AM Age at listing (hypothetical): 62 years Sex: Male at 07/08/2024  5:32 AM   CTAP with contrast 08/23/2024:  FINDINGS: Lower chest: Normal heart size. No pericardial effusion. Stable lung volumes, mild bibasilar atelectasis. No pleural effusion.   Hepatobiliary: Negative liver and gallbladder.  No bile duct dilatation.   Pancreas: Negative.   Spleen: Estimated splenic volume 698 mL (normal splenic volume range 83 - 412 mL). This is stable, nonspecific. No discrete splenic lesion. No perisplenic fluid.   Adrenals/Urinary Tract: Normal adrenal glands. Kidneys appears stable and negative, symmetric renal enhancement and contrast excretion 2 diminutive ureters. Diminutive and unremarkable bladder. No urinary calculus identified.   Stomach/Bowel: Mild large bowel redundancy. Unremarkable colon and retrocecal appendix (series 2, image 57). No large bowel inflammation. Decompressed terminal ileum and no dilated small bowel. Mild retained gas and fluid in the stomach and proximal duodenum. Distal duodenum tapers. No pneumoperitoneum, free fluid, mesenteric inflammation identified.   Vascular/Lymphatic: Major arterial structures, portal venous system in the abdomen and pelvis appear patent. Minimal atherosclerosis. Normal caliber abdominal aorta. No lymphadenopathy.   Reproductive: Chronic right inguinal hernia repair with mesh is stable. Otherwise negative.   Other: No pelvis free fluid.   Musculoskeletal: No acute osseous abnormality identified.   IMPRESSION: 1. No acute or inflammatory process identified in the abdomen or pelvis. Normal appendix. 2. Stable Splenomegaly, nonspecific. No superimposed lymphadenopathy or other related findings.   CTAP with contrast 08/04/2024:  Lower chest: No acute abnormality.    Hepatobiliary: Hepatomegaly measuring 18.8 cm in craniocaudal dimension. Mild hepatic steatosis. No focal liver abnormality is seen. No gallstones, gallbladder wall thickening, or biliary dilatation.   Pancreas: Unremarkable. No pancreatic ductal dilatation or surrounding inflammatory changes.   Spleen: Splenomegaly measuring 18.5 cm   Adrenals/Urinary Tract: Adrenal glands are unremarkable. Kidneys are normal, without renal calculi, focal lesion, or hydronephrosis. Bladder is unremarkable.   Stomach/Bowel: Stomach is within normal limits. Appendix appears normal. Submucosal fat along the ascending colon and terminal ileum suggestive of chronic inflammatory changes. A few sigmoid diverticula without suspicious finding to suggest diverticulitis. No evidence of bowel wall thickening, distention or obstruction.   Vascular/Lymphatic: No significant vascular findings are present. No enlarged abdominal or pelvic lymph nodes.   Reproductive: Prostate is unremarkable.   Other: Small left inguinal fat containing hernia. Status post right inguinal hernia mesh repair. No abdominopelvic ascites.   Musculoskeletal: No acute or significant osseous findings.   IMPRESSION: Hepatomegaly and hepatic steatosis.   Splenomegaly.   Submucosal fat along the ascending colon and terminal ileum suggestive of chronic inflammatory changes. Correlate with history of chronic colitis.   Left inguinal fat containing hernia.    PAST GI PROCEDURES:  Colonoscopy  Feb 2025 - One 4 mm polyp at the hepatic flexure, removed with a cold snare. Resected and retrieved.  - Four 3 to 4 mm polyps in the transverse colon, removed with a cold snare. Resected and retrieved.  - A tattoo was seen in the distal transverse colon. The tattoo site appeared normal.  - Mild diverticulosis in the descending colon. There was no evidence of diverticular bleeding. - The examined portion of the ileum was normal.  - The distal  rectum and anal verge are normal on retroflexion view. -Recall colonoscopy 3 years   FINAL DIAGNOSIS       1. Surgical [P], colon, hepatic flexure, transverse, polyp (5) :       -  TUBULAR ADENOMA, FRAGMENTS.    Colonoscopy 02/23/2023 - Preparation of the colon was inadequate.  - One 5 mm polyp in the ascending colon, removed with a cold snare. Resected and retrieved.  - One 10 mm polyp in the ascending colon, removed with a cold snare. Resected and retrieved.  - Two 4 to 5 mm polyps  at the splenic flexure, removed with a cold snare. Resected and retrieved.  - A tattoo was seen in the distal transverse colon. The tattoo site appeared normal.  - Normal mucosa in the entire examined colon. Biopsied.  - The examined portion of the ileum was normal.  - The distal rectum and anal verge are normal on retroflexion view.  - The GI Genius ( intelligent endoscopy module) , computer- aided polyp detection system powered by AI was utilized to detect colorectal polyps through enhanced visualization during colonoscopy - repeat < 1 year   EGD 02/23/2023 for diarrhea, no, vomiting, weight loss - The examined portions of the nasopharynx, oropharynx and larynx were normal.  - Esophageal mucosal changes suspicious for short- segment Barrett' s esophagus, classified as Barrett' s stage C0- M2 per Prague criteria. Biopsied.  - Normal stomach. Biopsied.  - Normal examined duodenum. Biopsied.   Diagnosis 1. Surgical [P], duodenal - BENIGN SMALL BOWEL MUCOSA WITH NO SIGNIFICANT PATHOLOGIC CHANGES 2. Surgical [P], gastric - GASTRIC ANTRAL AND OXYNTIC MUCOSA WITH MILD REACTIVE/REPARATIVE CHANGES - NEGATIVE FOR H. PYLORI ON H&E STAIN - NEGATIVE FOR INTESTINAL METAPLASIA OR MALIGNANCY 3. Surgical [P], esophagus at 40 cm - BARRETT MUCOSA, NEGATIVE FOR DYSPLASIA 4. Surgical [P], esophagus at 38 cm - BARRETT MUCOSA, NEGATIVE FOR DYSPLASIA 5. Surgical [P], colon, splenic flexure and ascending, polyp (4) - TUBULAR  ADENOMA(S). - NO HIGH GRADE DYSPLASIA OR MALIGNANCY. 6. Surgical [P], random colon sites - BENIGN COLONIC MUCOSA WITH NO SPECIFIC PATHOLOGIC CHANGES - NEGATIVE FOR INCREASED INTRAEPITHELIAL LYMPHOCYTES OR THICKENED SUBEPITHELIAL COLLAGEN TABLE - NEGATIVE FOR DYSPLASIA OR MALIGNANCY    Colonoscopy 03/2022: - Ten 4 to 8 mm polyps at the recto-sigmoid colon, in the descending colon, at the splenic flexure, in the transverse colon and at the hepatic flexure, removed with a cold snare. Resected and retrieved.  - One 20 mm polyp in the distal transverse colon, removed using injection-lift and a hot snare. Resected and retrieved. Clips (MR conditional) were placed. Tattooed.  - One 15 mm polyp in the sigmoid colon, removed with a hot snare. Resected and retrieved. Biopsied.  - One 8 mm polyp in the sigmoid colon, removed with a cold snare. Resected and retrieved.  - The examined portion of the ileum was normal.  - The distal rectum and anal verge are normal on retroflexion view.   1. Surgical [P], colon, recto-sigmoid, descending, splenic flexure, transverse, and hepatic flexure, polyp (10) - TUBULAR ADENOMAS, MULTIPLE ADENOMATOUS FRAGMENTS, NEGATIVE FOR HIGH-GRADE DYSPLASIA. 2. Surgical [P], colon, transverse, polyp (1) - TUBULAR ADENOMA, NEGATIVE FOR HIGH-GRADE DYSPLASIA. 3. Surgical [P], colon, sigmoid, polyp (1) - TUBULAR ADENOMA, NEGATIVE FOR HIGH-GRADE DYSPLASIA. 4. Surgical [P], colon, sigmoid, polyp (1) - TUBULOVILLOUS ADENOMA, NEGATIVE FOR HIGH-GRADE DYSPLASIA, PEDUNCULATED (STALK MARGIN NEGATIVE FOR ADENOMATOUS CHANGE). 5. Surgical [P], colon, sigmoid, polyp (1) SUGGESTIVE OF GOBLET CELL-RICH HYPERPLASTIC POLYP.  Repeat recommended in 6 months.  Current Outpatient Medications on File Prior to Visit  Medication Sig Dispense Refill   gabapentin  (NEURONTIN ) 300 MG capsule TAKE 1 CAPSULE BY MOUTH THREE TIMES A DAY 90 capsule 2   omeprazole  (PRILOSEC) 20 MG capsule Take 1 capsule (20 mg  total) by mouth daily. 30 capsule 0   SUMAtriptan  (IMITREX ) 25 MG tablet Take 1 tablet (25 mg total) by mouth every 2 (two) hours as needed for migraine. May repeat in 2 hours if headache persists or recurs. 10 tablet 2   No current facility-administered medications on file prior to visit.  Allergies  Allergen Reactions   Fentanyl  Itching   Hydromorphone  Itching and Rash   Current Medications, Allergies, Past Medical History, Past Surgical History, Family History and Social History were reviewed in [employer] record.  Review of Systems:   Constitutional: Negative for fever, sweats, chills or unintentional weight loss.  Respiratory: Negative for shortness of breath.   Cardiovascular: Negative for chest pain, palpitations and leg swelling.  Gastrointestinal: See HPI.  Musculoskeletal: Negative for back pain or muscle aches.  Neurological: Negative for dizziness, headaches or paresthesias.   Physical Exam: BP 120/70   Pulse 94   Ht 6' 1 (1.854 m)   Wt 208 lb (94.3 kg)   BMI 27.44 kg/m   Wt Readings from Last 3 Encounters:  09/13/24 208 lb (94.3 kg)  09/13/24 208 lb (94.3 kg)  08/23/24 208 lb (94.3 kg)    General: 53 year old male in no acute distress. Head: Normocephalic and atraumatic. Eyes: No scleral icterus. Conjunctiva pink . Ears: Normal auditory acuity. Mouth: Dentition intact. No ulcers or lesions.  Lungs: Clear throughout to auscultation. Heart: Regular rate and rhythm, no murmur. Abdomen: Soft, nontender and nondistended. No masses or hepatomegaly.  No ascites.  Normal bowel sounds x 4 quadrants.  Umbilical scar intact. Rectal: Deferred.  Musculoskeletal: Symmetrical with no gross deformities. Extremities: No edema. Neurological: Alert oriented x 4. No focal deficits.  Psychological: Alert and cooperative. Normal mood and affect  Assessment and Recommendations:  53 year old male with hepatic steatosis, splenomegaly and thrombocytopenia  concerning for cirrhosis. In review of epic records, he has had thrombocytopenia since 2010, hepatic steatosis and splenomegaly since 2011.  Recent CTAP with contrast 08/04/2024 showed evidence of hepatic steatosis without cirrhotic contour, hepatomegaly and splenomegaly. Mildly elevated LFTs, normal albumin and PT/INR.  MELD 3.0: 6. Suspect Met-ALD cirrhosis.  -CBC, CMP, INR, hepatitis A total antibody, hepatitis B surface antigen, hepatitis B core total antibody, hepatitis B surface antibodies, hepatitis C antibody, ANA, SMA, AMA, IgG, iron, ferritin, alpha 1 antitrypsin and ceruloplasmin - Patient underwent an EGD 02/2023 without evidence of esophageal/gastric varices therefore variceal surveillance EGD deferred for now. Consider surveillance EGD 02/2025. - FibroScan not yet available to our outpatient GI clinic therefore patient was tentatively scheduled for an abdominal ultrasound with elastography to assess for advanced liver disease/suspected cirrhosis. However, a liver biopsy also to be considered to clarify his diagnosis.  To discuss further with Dr. Stacia.  Remote history of alcohol use disorder age early 27's - 32s, no alcohol for the past year. - Encouraged patient to remain 100% abstinent from alcohol  Barrett's esophagus.  EGD 02/2023 identified Barrett's esophagus without dysplasia. - Next Barrett's esophagus surveillance EGD due 02/2026  Chronic diarrhea, suspect secondary to dietary factors including consumption of 3 red bull drinks 5 days weekly and increased stress level.  CTAP 08/04/2024 showed submucosal fat along the ascending colon and terminal ileum suggestive of chronic inflammatory changes.  Repeat CTAP 9/30 showed an unremarkable colon and the TI was decompressed.  Colonoscopy 12/2023 without evidence of colitis.  EGD 02/2023 included duodenal biopsies which were negative for celiac disease. - CRP and Sed rate  - Benefiber 1 tablespoon daily to bulk up stool if  tolerated- -Imodium  1 tab p.o. daily, stop if no BM in 24 hours   Left inguinal hernia containing fat per CTAP 08/04/2024, asymptomatic at this time.  Query if his intermittent episodes of LLQ pain are possibly related to his left inguinal hernia? - Consider future  referral to general surgery

## 2024-09-13 NOTE — Patient Instructions (Signed)
 You have been scheduled for an abdominal ultrasound at [facility] (1st floor of hospital) on 09/22/24 at 9:30 am . Please arrive 30 minutes prior to your appointment for registration. Make certain not to have anything to eat or drink 6 hours prior to your appointment. Should you need to reschedule your appointment, please contact radiology at [phone]. This test typically takes about 30 minutes to perform.  Your provider has requested that you go to the basement level for lab work before leaving today. Press B on the elevator. The lab is located at the first door on the left as you exit the elevator.  Wean off of Red Bull Energy drinks.   A high fiber diet with plenty of fluids (up to 8 glasses of water daily) is suggested to relieve these symptoms.  Benefiber, 1 tablespoon once daily can be used to keep bowels regular if needed.  Please purchase the following medications over the counter and take as directed: Imodium  - 1 tablet by mouth once daily. Stop if no bowel movement within 24 hrs.   Due to recent changes in healthcare laws, you may see the results of your imaging and laboratory studies on MyChart before your provider has had a chance to review them.  We understand that in some cases there may be results that are confusing or concerning to you. Not all laboratory results come back in the same time frame and the provider may be waiting for multiple results in order to interpret others.  Please give us  48 hours in order for your provider to thoroughly review all the results before contacting the office for clarification of your results.   _______________________________________________________  If your blood pressure at your visit was 140/90 or greater, please contact your primary care physician to follow up on this.  _______________________________________________________  If you are age 53 or older, your body mass index should be between 23-30. Your Body mass index is  27.44 kg/m. If this is out of the aforementioned range listed, please consider follow up with your Primary Care Provider.  If you are age 30 or younger, your body mass index should be between 19-25. Your Body mass index is 27.44 kg/m. If this is out of the aformentioned range listed, please consider follow up with your Primary Care Provider.   ________________________________________________________  The [facility] providers would like to encourage you to use MYCHART to communicate with providers for non-urgent requests or questions.  Due to long hold times on the telephone, sending your provider a message by [facility] may be a faster and more efficient way to get a response.  Please allow 48 business hours for a response.  Please remember that this is for non-urgent requests.  _______________________________________________________  [facility] is using a team-based approach to care.  Your team is made up of your doctor and two to three APPS. Our APPS (Nurse Practitioners and Physician Assistants) work with your physician to ensure care continuity for you. They are fully qualified to address your health concerns and develop a treatment plan. They communicate directly with your gastroenterologist to care for you. Seeing the Advanced Practice Practitioners on your physician's team can help you by facilitating care more promptly, often allowing for earlier appointments, access to diagnostic testing, procedures, and other specialty referrals.   Thank you for choosing me and [facility].  Elida Shawl, CRNP

## 2024-09-13 NOTE — Assessment & Plan Note (Signed)
 Relatively new diagnosis, seems to be a developing situation.  Patient has a history of alcohol use, he denies much current use.  He has elevated liver enzymes with AST higher than ALT.  On cross-sectional imaging he has splenomegaly, and he has thrombocytopenia.  On exam he has gynecomastia and sarcopenia.  No ascites.  No encephalopathy or coagulopathy.  To reduce his risk of progressive liver disease, I strongly encouraged him to discontinue all alcohol use.  He has consultation today with GI, I would like them to consider endoscopy for variceal screening.  He has had negative hepatitis B and C testing 2 years ago.  No signs of volume overload, so I do not think there is a need for diuretics.

## 2024-09-15 LAB — HEPATITIS B SURFACE ANTIBODY,QUALITATIVE: Hep B S Ab: REACTIVE — AB

## 2024-09-15 LAB — CERULOPLASMIN: Ceruloplasmin: 24 mg/dL (ref 14–30)

## 2024-09-15 LAB — MITOCHONDRIAL ANTIBODIES: Mitochondrial M2 Ab, IgG: <=20 U (ref <=20.0)

## 2024-09-15 LAB — HEPATITIS A ANTIBODY, TOTAL: Hepatitis A AB,Total: REACTIVE — AB

## 2024-09-15 LAB — ANA: Anti Nuclear Antibody (ANA): NEGATIVE

## 2024-09-15 LAB — IRON, TOTAL/TOTAL IRON BINDING CAP
%SAT: 27 % (ref 20–48)
Iron: 136 ug/dL (ref 50–180)
TIBC: 497 ug/dL — ABNORMAL HIGH (ref 250–425)

## 2024-09-15 LAB — HEPATITIS B SURFACE ANTIGEN: Hepatitis B Surface Ag: NONREACTIVE

## 2024-09-15 LAB — ANTI-SMOOTH MUSCLE ANTIBODY, IGG: Actin (Smooth Muscle) Antibody (IGG): <20 U (ref <20)

## 2024-09-15 LAB — ALPHA-1-ANTITRYPSIN: A-1 Antitrypsin, Ser: 141 mg/dL (ref 83–199)

## 2024-09-15 LAB — IGG: IgG (Immunoglobin G), Serum: 1058 mg/dL (ref 600–1640)

## 2024-09-15 LAB — HEPATITIS B CORE ANTIBODY, TOTAL: Hep B Core Total Ab: NONREACTIVE

## 2024-09-15 LAB — HEPATITIS C ANTIBODY: Hepatitis C Ab: NONREACTIVE

## 2024-09-17 ENCOUNTER — Other Ambulatory Visit: Payer: Self-pay | Admitting: Student in an Organized Health Care Education/Training Program

## 2024-09-17 DIAGNOSIS — J301 Allergic rhinitis due to pollen: Secondary | ICD-10-CM

## 2024-09-18 NOTE — Progress Notes (Signed)
 Agree with the assessment and plan as outlined by Elida Shawl, NP.  Would not recommend repeat EGD for variceal screening.  Low suspicion for cirrhosis, as patient's thrombocytopenia is chronic.  Agree with elastography.  Would not repeat colonoscopy either to look for IBD.  Patient's symptoms are chronic and he has had numerous colonoscopies in the past 2 years due to colon polyps.   Would recommend consideration of other therapies to help manage his IBS, such as viberzi or rifaximin.   Evanthia Maund E. Stacia, MD [facility]

## 2024-09-22 ENCOUNTER — Ambulatory Visit (HOSPITAL_COMMUNITY)
Admission: RE | Admit: 2024-09-22 | Discharge: 2024-09-22 | Disposition: A | Discharge status: Home / Self Care | Source: Ambulatory Visit | Attending: Nurse Practitioner | Admitting: Nurse Practitioner

## 2024-09-22 DIAGNOSIS — K760 Fatty (change of) liver, not elsewhere classified: Secondary | ICD-10-CM | POA: Diagnosis not present

## 2024-09-22 DIAGNOSIS — R1032 Left lower quadrant pain: Secondary | ICD-10-CM | POA: Diagnosis present

## 2024-09-22 DIAGNOSIS — R7989 Other specified abnormal findings of blood chemistry: Secondary | ICD-10-CM | POA: Diagnosis present

## 2024-09-22 DIAGNOSIS — K529 Noninfective gastroenteritis and colitis, unspecified: Secondary | ICD-10-CM | POA: Insufficient documentation

## 2024-09-22 DIAGNOSIS — R161 Splenomegaly, not elsewhere classified: Secondary | ICD-10-CM | POA: Insufficient documentation

## 2024-09-22 DIAGNOSIS — K2270 Barrett's esophagus without dysplasia: Secondary | ICD-10-CM | POA: Diagnosis present

## 2024-09-26 ENCOUNTER — Emergency Department (HOSPITAL_COMMUNITY)
Admission: EM | Admit: 2024-09-26 | Discharge: 2024-09-26 | Disposition: A | Discharge status: Home / Self Care | Attending: Emergency Medicine | Admitting: Emergency Medicine

## 2024-09-26 ENCOUNTER — Other Ambulatory Visit: Payer: Self-pay

## 2024-09-26 ENCOUNTER — Encounter (HOSPITAL_COMMUNITY): Payer: Self-pay | Admitting: Emergency Medicine

## 2024-09-26 DIAGNOSIS — R001 Bradycardia, unspecified: Secondary | ICD-10-CM | POA: Diagnosis not present

## 2024-09-26 DIAGNOSIS — R1032 Left lower quadrant pain: Secondary | ICD-10-CM | POA: Diagnosis not present

## 2024-09-26 LAB — CBC
HCT: 46 % (ref 39.0–52.0)
Hemoglobin: 16.3 g/dL (ref 13.0–17.0)
MCH: 29.7 pg (ref 26.0–34.0)
MCHC: 35.4 g/dL (ref 30.0–36.0)
MCV: 83.9 fL (ref 80.0–100.0)
Platelets: 148 K/uL — ABNORMAL LOW (ref 150–400)
RBC: 5.48 MIL/uL (ref 4.22–5.81)
RDW: 13.2 % (ref 11.5–15.5)
WBC: 9 K/uL (ref 4.0–10.5)
nRBC: 0 % (ref 0.0–0.2)

## 2024-09-26 LAB — URINALYSIS, ROUTINE W REFLEX MICROSCOPIC
Bilirubin Urine: NEGATIVE
Glucose, UA: NEGATIVE mg/dL
Hgb urine dipstick: NEGATIVE
Ketones, ur: NEGATIVE mg/dL
Leukocytes,Ua: NEGATIVE
Nitrite: NEGATIVE
Protein, ur: NEGATIVE mg/dL
Specific Gravity, Urine: 1.004 — ABNORMAL LOW (ref 1.005–1.030)
pH: 7 (ref 5.0–8.0)

## 2024-09-26 LAB — COMPREHENSIVE METABOLIC PANEL WITH GFR
ALT: 58 U/L — ABNORMAL HIGH (ref 0–44)
AST: 45 U/L — ABNORMAL HIGH (ref 15–41)
Albumin: 4.9 g/dL (ref 3.5–5.0)
Alkaline Phosphatase: 73 U/L (ref 38–126)
Anion gap: 13 (ref 5–15)
BUN: 8 mg/dL (ref 6–20)
CO2: 25 mmol/L (ref 22–32)
Calcium: 9.6 mg/dL (ref 8.9–10.3)
Chloride: 99 mmol/L (ref 98–111)
Creatinine, Ser: 0.78 mg/dL (ref 0.61–1.24)
GFR, Estimated: >60 mL/min (ref >60)
Glucose, Bld: 133 mg/dL — ABNORMAL HIGH (ref 70–99)
Potassium: 3.6 mmol/L (ref 3.5–5.1)
Sodium: 137 mmol/L (ref 135–145)
Total Bilirubin: 1 mg/dL (ref 0.0–1.2)
Total Protein: 7.9 g/dL (ref 6.5–8.1)

## 2024-09-26 LAB — LIPASE, BLOOD: Lipase: 50 U/L (ref 11–51)

## 2024-09-26 MED ORDER — KETOROLAC TROMETHAMINE 30 MG/ML IJ SOLN
30.0000 mg | Freq: Once | INTRAMUSCULAR | Status: AC
  Administered 2024-09-26: 30 mg via INTRAVENOUS
  Filled 2024-09-26: qty 1

## 2024-09-26 MED ORDER — PROCHLORPERAZINE EDISYLATE 10 MG/2ML IJ SOLN
10.0000 mg | Freq: Once | INTRAMUSCULAR | Status: DC
  Filled 2024-09-26: qty 2

## 2024-09-26 MED ORDER — DIPHENHYDRAMINE HCL 50 MG/ML IJ SOLN
25.0000 mg | Freq: Once | INTRAMUSCULAR | Status: AC
  Administered 2024-09-26: 25 mg via INTRAVENOUS
  Filled 2024-09-26: qty 1

## 2024-09-26 MED ORDER — HYOSCYAMINE SULFATE 0.125 MG SL SUBL: 0.1250 mg | SUBLINGUAL_TABLET | SUBLINGUAL | 0 refills | Status: DC | PRN

## 2024-09-26 MED ORDER — SODIUM CHLORIDE 0.9 % IV BOLUS
1000.0000 mL | Freq: Once | INTRAVENOUS | Status: AC
  Administered 2024-09-26: 1000 mL via INTRAVENOUS

## 2024-09-26 NOTE — ED Provider Notes (Signed)
 [place] EMERGENCY DEPARTMENT AT [facility] Provider Note   CSN: [id number] Arrival date & time: 09/26/24  9662     Patient presents with: Abdominal Pain   [name] is a 53 y.o. male.   Presents to the emergency department for evaluation of left lower abdominal pain.  Patient has been suffering from this for some time recently.  He reports that he will suddenly get severe pains and cramping in the left lower abdomen.  He has been seen in the ED multiple times, imaging has been negative.  He did follow-up with GI and had a specialized ultrasound performed, has not gotten the results yet.       Prior to Admission medications   Medication Sig Start Date End Date Taking? Authorizing Provider  hyoscyamine (LEVSIN SL) 0.125 MG SL tablet Place 1-2 tablets (0.125-0.25 mg total) under the tongue every 4 (four) hours as needed for cramping (abdominal pain). 09/26/24  Yes Zavian Slowey, Lonni PARAS, MD  dicyclomine  (BENTYL ) 20 MG tablet Take 1 tablet (20 mg total) by mouth 3 (three) times daily before meals. 09/13/24   Jerrell Cleatus Ned, MD  DULoxetine  (CYMBALTA ) 30 MG capsule Take 1 capsule (30 mg total) by mouth daily. 09/13/24   Jerrell Cleatus Ned, MD  gabapentin  (NEURONTIN ) 300 MG capsule TAKE 1 CAPSULE BY MOUTH THREE TIMES A DAY 08/05/24   Jerrell Cleatus Ned, MD  omeprazole  (PRILOSEC) 20 MG capsule Take 1 capsule (20 mg total) by mouth daily. 06/23/24   Horton, Charmaine FALCON, MD  SUMAtriptan  (IMITREX ) 25 MG tablet Take 1 tablet (25 mg total) by mouth every 2 (two) hours as needed for migraine. May repeat in 2 hours if headache persists or recurs. 07/22/24   Jerrell Cleatus Ned, MD    Allergies: Fentanyl  and Hydromorphone     Review of Systems  Updated Vital Signs BP (!) 93/59   Pulse 87   Temp (!) 97.4 F (36.3 C) (Oral)   Resp (!) 23   SpO2 99%   Physical Exam Vitals and nursing note reviewed.  Constitutional:      General: He is not in acute distress.     Appearance: He is well-developed.  HENT:     Head: Normocephalic and atraumatic.     Mouth/Throat:     Mouth: Mucous membranes are moist.  Eyes:     General: Vision grossly intact. Gaze aligned appropriately.     Extraocular Movements: Extraocular movements intact.     Conjunctiva/sclera: Conjunctivae normal.  Cardiovascular:     Rate and Rhythm: Normal rate and regular rhythm.     Pulses: Normal pulses.     Heart sounds: Normal heart sounds, S1 normal and S2 normal. No murmur heard.    No friction rub. No gallop.  Pulmonary:     Effort: Pulmonary effort is normal. No respiratory distress.     Breath sounds: Normal breath sounds.  Abdominal:     Palpations: Abdomen is soft.     Tenderness: There is abdominal tenderness in the left lower quadrant. There is no guarding or rebound.     Hernia: No hernia is present.  Musculoskeletal:        General: No swelling.     Cervical back: Full passive range of motion without pain, normal range of motion and neck supple. No pain with movement, spinous process tenderness or muscular tenderness. Normal range of motion.     Right lower leg: No edema.     Left lower leg: No edema.  Skin:    General: Skin is warm and dry.     Capillary Refill: Capillary refill takes less than 2 seconds.     Findings: No ecchymosis, erythema, lesion or wound.  Neurological:     Mental Status: He is alert and oriented to person, place, and time.     GCS: GCS eye subscore is 4. GCS verbal subscore is 5. GCS motor subscore is 6.     Cranial Nerves: Cranial nerves 2-12 are intact.     Sensory: Sensation is intact.     Motor: Motor function is intact. No weakness or abnormal muscle tone.     Coordination: Coordination is intact.  Psychiatric:        Mood and Affect: Mood normal.        Speech: Speech normal.        Behavior: Behavior normal.     (all labs ordered are listed, but only abnormal results are displayed) Labs Reviewed  COMPREHENSIVE METABOLIC PANEL  WITH GFR - Abnormal; Notable for the following components:      Result Value   Glucose, Bld 133 (*)    AST 45 (*)    ALT 58 (*)    All other components within normal limits  CBC - Abnormal; Notable for the following components:   Platelets 148 (*)    All other components within normal limits  LIPASE, BLOOD  URINALYSIS, ROUTINE W REFLEX MICROSCOPIC    EKG: None  Radiology: No results found.   Procedures   Medications Ordered in the ED  prochlorperazine  (COMPAZINE ) injection 10 mg (0 mg Intravenous Hold 09/26/24 0444)  sodium chloride  0.9 % bolus 1,000 mL (0 mLs Intravenous Stopped 09/26/24 0548)  ketorolac  (TORADOL ) 30 MG/ML injection 30 mg (30 mg Intravenous Given 09/26/24 0437)  diphenhydrAMINE  (BENADRYL ) injection 25 mg (25 mg Intravenous Given 09/26/24 0438)                                    Medical Decision Making Amount and/or Complexity of Data Reviewed External Data Reviewed: labs, radiology and notes. Labs: ordered. Decision-making details documented in ED Course.  Risk Prescription drug management.   Differential Diagnosis considered includes, but not limited to: Appendicitis; colitis; diverticulitis; bowel obstruction; hernia; cystitis; nephrolithiasis; pyelonephritis.  Presents to the emergency department with left lower quadrant pain.  I have reviewed his records and he has been seen multiple times for this and has had multiple imaging modalities.  I did look at his abdominal ultrasound with elastography.  Results ruled out cACLD.  I do not feel the patient requires additional imaging.  With the sudden onset and also patient reports the last 2 times he has had this abdominal pain he developed a headache, abdominal migraine was considered.  He was given Toradol , Compazine  and Benadryl .  He did have some redness of the arm with injection of Compazine , only half the dose was given.  No signs of systemic allergic reaction.  Upon recheck he is pain-free.  It is  unclear if the medication combination helped.  He does report that it will often just suddenly resolve on its own.  There is not appear to be a life-threatening event present.  GI notes to reference possible irritable bowel.  He does report stress secondary to currently going through a divorce.  Prescription for Levsin provided to be used as needed to see if it helps with the pains.  Follow-up with  GI.     Final diagnoses:  Left lower quadrant abdominal pain    ED Discharge Orders          Ordered    hyoscyamine (LEVSIN SL) 0.125 MG SL tablet  Every 4 hours PRN        09/26/24 0611               Haze Lonni PARAS, MD 09/26/24 [phone]

## 2024-09-26 NOTE — ED Triage Notes (Addendum)
 Pt arrives w/ LLQ abd pain that began suddenly 15 mins ago. Also c/o n/v. Hx of same. Had abd US  1 week ago. No results yet. Denies pain that radiates. Denies CP/SOB. Chronic abd pain & diarrhea.

## 2024-10-04 ENCOUNTER — Other Ambulatory Visit: Payer: Self-pay | Admitting: Student in an Organized Health Care Education/Training Program

## 2024-10-04 DIAGNOSIS — G959 Disease of spinal cord, unspecified: Secondary | ICD-10-CM

## 2024-10-08 ENCOUNTER — Emergency Department (HOSPITAL_COMMUNITY)

## 2024-10-08 ENCOUNTER — Emergency Department (HOSPITAL_COMMUNITY)
Admission: EM | Admit: 2024-10-08 | Discharge: 2024-10-08 | Disposition: A | Discharge status: Home / Self Care | Attending: Emergency Medicine | Admitting: Emergency Medicine

## 2024-10-08 ENCOUNTER — Other Ambulatory Visit: Payer: Self-pay

## 2024-10-08 DIAGNOSIS — M25572 Pain in left ankle and joints of left foot: Secondary | ICD-10-CM | POA: Diagnosis not present

## 2024-10-08 DIAGNOSIS — J45909 Unspecified asthma, uncomplicated: Secondary | ICD-10-CM | POA: Diagnosis not present

## 2024-10-08 DIAGNOSIS — S92145A Nondisplaced dome fracture of left talus, initial encounter for closed fracture: Secondary | ICD-10-CM | POA: Insufficient documentation

## 2024-10-08 DIAGNOSIS — S99922A Unspecified injury of left foot, initial encounter: Secondary | ICD-10-CM | POA: Diagnosis present

## 2024-10-08 DIAGNOSIS — S92155A Nondisplaced avulsion fracture (chip fracture) of left talus, initial encounter for closed fracture: Secondary | ICD-10-CM | POA: Diagnosis not present

## 2024-10-08 DIAGNOSIS — R42 Dizziness and giddiness: Secondary | ICD-10-CM | POA: Diagnosis not present

## 2024-10-08 DIAGNOSIS — T7840XA Allergy, unspecified, initial encounter: Secondary | ICD-10-CM | POA: Diagnosis not present

## 2024-10-08 DIAGNOSIS — R609 Edema, unspecified: Secondary | ICD-10-CM | POA: Diagnosis not present

## 2024-10-08 DIAGNOSIS — X500XXA Overexertion from strenuous movement or load, initial encounter: Secondary | ICD-10-CM | POA: Diagnosis not present

## 2024-10-08 DIAGNOSIS — S92102A Unspecified fracture of left talus, initial encounter for closed fracture: Secondary | ICD-10-CM

## 2024-10-08 DIAGNOSIS — W19XXXA Unspecified fall, initial encounter: Secondary | ICD-10-CM | POA: Diagnosis not present

## 2024-10-08 DIAGNOSIS — S9002XA Contusion of left ankle, initial encounter: Secondary | ICD-10-CM | POA: Diagnosis not present

## 2024-10-08 DIAGNOSIS — R9431 Abnormal electrocardiogram [ECG] [EKG]: Secondary | ICD-10-CM | POA: Diagnosis not present

## 2024-10-08 LAB — COMPREHENSIVE METABOLIC PANEL WITH GFR
ALT: 68 U/L — ABNORMAL HIGH (ref 0–44)
AST: 47 U/L — ABNORMAL HIGH (ref 15–41)
Albumin: 4.7 g/dL (ref 3.5–5.0)
Alkaline Phosphatase: 94 U/L (ref 38–126)
Anion gap: 10 (ref 5–15)
BUN: 13 mg/dL (ref 6–20)
CO2: 27 mmol/L (ref 22–32)
Calcium: 9.4 mg/dL (ref 8.9–10.3)
Chloride: 100 mmol/L (ref 98–111)
Creatinine, Ser: 0.79 mg/dL (ref 0.61–1.24)
GFR, Estimated: >60 mL/min (ref >60)
Glucose, Bld: 123 mg/dL — ABNORMAL HIGH (ref 70–99)
Potassium: 4.1 mmol/L (ref 3.5–5.1)
Sodium: 137 mmol/L (ref 135–145)
Total Bilirubin: 0.7 mg/dL (ref 0.0–1.2)
Total Protein: 7.6 g/dL (ref 6.5–8.1)

## 2024-10-08 LAB — CBC
HCT: 43.6 % (ref 39.0–52.0)
Hemoglobin: 15.2 g/dL (ref 13.0–17.0)
MCH: 29.5 pg (ref 26.0–34.0)
MCHC: 34.9 g/dL (ref 30.0–36.0)
MCV: 84.7 fL (ref 80.0–100.0)
Platelets: 123 K/uL — ABNORMAL LOW (ref 150–400)
RBC: 5.15 MIL/uL (ref 4.22–5.81)
RDW: 13.2 % (ref 11.5–15.5)
WBC: 10.2 K/uL (ref 4.0–10.5)
nRBC: 0 % (ref 0.0–0.2)

## 2024-10-08 MED ORDER — OXYCODONE-ACETAMINOPHEN 5-325 MG PO TABS
2.0000 | ORAL_TABLET | Freq: Once | ORAL | Status: AC
  Administered 2024-10-08: 2 via ORAL
  Filled 2024-10-08: qty 2

## 2024-10-08 MED ORDER — OXYCODONE-ACETAMINOPHEN 5-325 MG PO TABS: 1.0000 | ORAL_TABLET | Freq: Four times a day (QID) | ORAL | 0 refills | Status: DC | PRN

## 2024-10-08 NOTE — ED Provider Notes (Signed)
 [place] EMERGENCY DEPARTMENT AT [facility] Provider Note   CSN: [id number] Arrival date & time: 10/08/24  8966     Patient presents with: Ankle Pain   Alan Sanders is a 53 y.o. male.  {Add pertinent medical, surgical, social history, OB history to YEP:67052} The history is provided by the patient, medical records and the EMS personnel. No language interpreter was used.  Ankle Pain    53 year old male significant history of liver cirrhosis, depression, asthma, anxiety, gait difficulty, seizures brought here via EMS from home for evaluation of a fall.  Patient states last night he got up in the middle of the night to use the bathroom.  On his way back he twisted his left foot and ankle and fell to the ground.  He denies hitting his head or loss of consciousness.  He report acute onset of severe pain about his left foot/ankle and was unable to bear weight on it.  He does not endorse any knee or hip pain denies any headache lightheadedness or dizziness.  He is not on any blood thinner medication.  He also endorsed lightheadedness when his pain is severe.  EMS did give patient fentanyl  and Benadryl  on route.  Benadryl  was given for possible fentanyl  allergy.  He denies having any rash.  Denies any recent alcohol use.  Prior to Admission medications   Medication Sig Start Date End Date Taking? Authorizing Provider  dicyclomine  (BENTYL ) 20 MG tablet Take 1 tablet (20 mg total) by mouth 3 (three) times daily before meals. 09/13/24   Jerrell Cleatus Ned, MD  DULoxetine  (CYMBALTA ) 30 MG capsule Take 1 capsule (30 mg total) by mouth daily. 09/13/24   Jerrell Cleatus Ned, MD  gabapentin  (NEURONTIN ) 300 MG capsule TAKE 1 CAPSULE BY MOUTH THREE TIMES A DAY 08/05/24   Jerrell Cleatus Ned, MD  hyoscyamine (LEVSIN SL) 0.125 MG SL tablet Place 1-2 tablets (0.125-0.25 mg total) under the tongue every 4 (four) hours as needed for cramping (abdominal pain). 09/26/24   Haze Lonni PARAS, MD  omeprazole  (PRILOSEC) 20 MG capsule Take 1 capsule (20 mg total) by mouth daily. 06/23/24   Horton, Charmaine FALCON, MD  SUMAtriptan  (IMITREX ) 25 MG tablet Take 1 tablet (25 mg total) by mouth every 2 (two) hours as needed for migraine. May repeat in 2 hours if headache persists or recurs. 07/22/24   Jerrell Cleatus Ned, MD    Allergies: Fentanyl  and Hydromorphone     Review of Systems  All other systems reviewed and are negative.   Updated Vital Signs BP (!) 118/92 (BP Location: Right Arm)   Pulse 83   Temp 98.5 F (36.9 C) (Oral)   Resp 20   Ht 6' 1 (1.854 m)   Wt 103.4 kg   SpO2 97%   BMI 30.08 kg/m   Physical Exam Constitutional:      General: He is not in acute distress.    Appearance: He is well-developed.  HENT:     Head: Normocephalic and atraumatic.  Eyes:     Conjunctiva/sclera: Conjunctivae normal.  Cardiovascular:     Rate and Rhythm: Normal rate and regular rhythm.     Pulses: Normal pulses.     Heart sounds: Normal heart sounds.  Pulmonary:     Effort: Pulmonary effort is normal.     Breath sounds: Normal breath sounds.  Abdominal:     Palpations: Abdomen is soft.     Tenderness: There is no abdominal tenderness.  Musculoskeletal:  General: Signs of injury (Left lower extremity in a Sam splint.  Tenderness and swelling noted to both medial and lateral malleoli region without any bruising.  Decreased range of motion secondary to pain.  Intact DP pulse with brisk cap refill) present. Normal range of motion.     Cervical back: Normal range of motion and neck supple.  Skin:    Findings: No rash.  Neurological:     Mental Status: He is alert and oriented to person, place, and time.     (all labs ordered are listed, but only abnormal results are displayed) Labs Reviewed  CBC - Abnormal; Notable for the following components:      Result Value   Platelets 123 (*)    All other components within normal limits  COMPREHENSIVE METABOLIC  PANEL WITH GFR    EKG: None  Radiology: DG Ankle Complete Left Result Date: 10/08/2024 CLINICAL DATA:  Fall.  Ankle swelling and bruising.  Ankle pain. EXAM: LEFT ANKLE COMPLETE - 3+ VIEW COMPARISON:  None Available. FINDINGS: No gross fracture or dislocation. Cortical avulsion fragments along the lateral aspect of the talus suggest avulsion injury. There is prominent lateral soft tissue swelling. Curvilinear lucency identified in the bony anatomy of the medial foot, potentially the sustentacular tali suggests nondisplaced fracture, best seen on the oblique film. IMPRESSION: 1. Cortical avulsion fragments along the lateral aspect of the talus suggest avulsion injury. 2. Curvilinear lucency in the bony anatomy of the medial foot, potentially the sustentacular tali, suggests nondisplaced fracture. CT imaging of the foot may prove helpful to further evaluate. Electronically Signed   By: Camellia Candle M.D.   On: 10/08/2024 11:22    {Document cardiac monitor, telemetry assessment procedure when appropriate:32947} Procedures   Medications Ordered in the ED - No data to display    {Click here for ABCD2, HEART and other calculators REFRESH Note before signing:1}                              Medical Decision Making Amount and/or Complexity of Data Reviewed Labs: ordered. Radiology: ordered.   BP (!) 118/92 (BP Location: Right Arm)   Pulse 83   Temp 98.5 F (36.9 C) (Oral)   Resp 20   Ht 6' 1 (1.854 m)   Wt 103.4 kg   SpO2 97%   BMI 30.08 kg/m   6:106 PM  53 year old male significant history of liver cirrhosis, depression, asthma, anxiety, gait difficulty, seizures brought here via EMS from home for evaluation of a fall.  Patient states last night he got up in the middle of the night to use the bathroom.  On his way back he twisted his left foot and ankle and fell to the ground.  He denies hitting his head or loss of consciousness.  He report acute onset of severe pain about his left  foot/ankle and was unable to bear weight on it.  He does not endorse any knee or hip pain denies any headache lightheadedness or dizziness.  He is not on any blood thinner medication.  He also endorsed lightheadedness when his pain is severe.  EMS did give patient fentanyl  and Benadryl  on route.  Benadryl  was given for possible fentanyl  allergy.  He denies having any rash.  Denies any recent alcohol use.  Exam notable for tenderness about the left foot/ankle involving both medial and lateral malleoli region with associate swelling but no bruising noted.  Patient is neurovascular intact.  No other injury noted.  Normal mentation.  -Labs ordered, independently viewed and interpreted by me.  Labs remarkable for labs are reassuring no anemia -The patient was maintained on a cardiac monitor.  I personally viewed and interpreted the cardiac monitored which showed an underlying rhythm of: Normal sinus rhythm -Imaging independently viewed and interpreted by me and I agree with radiologist's interpretation.  Result remarkable for x-ray of the left ankle demonstrate cortical avulsion fragment in the lateral aspect of the talus as well as lucency in the medial foot near the sustentacular tali suggests nondisplace fx. this is a closed injury.  Patient is neurovascular intact.  Will place foot/ankle in an ankle splint and will provide crutches for nonweightbearing. -This patient presents to the ED for concern of fall, this involves an extensive number of treatment options, and is a complaint that carries with it a high risk of complications and morbidity.  The differential diagnosis includes fx, dislocation, strain, sprain, contusion, laceration -Co morbidities that complicate the patient evaluation includes liver cirrhosis, depression, gait difficulty, seizures -Treatment includes percocet, fentanyl  -Reevaluation of the patient after these medicines showed that the patient improved -PCP office notes or outside notes  reviewed -Escalation to admission/observation considered: patients feels much better, is comfortable with discharge, and will follow up with PCP -Prescription medication considered, patient comfortable with *** -Social Determinant of Health considered which includes ***   {Document critical care time when appropriate  Document review of labs and clinical decision tools ie [id number], etc  Document your independent review of radiology images and any outside records  Document your discussion with family members, caretakers and with consultants  Document social determinants of health affecting pt's care  Document your decision making why or why not admission, treatments were needed:32947:::1}   Final diagnoses:  None    ED Discharge Orders     None

## 2024-10-08 NOTE — Discharge Instructions (Signed)
 You have been evaluated for your fall.  You have been diagnosed with a broken left foot.  Use crutches to help with ambulation, take pain medication as needed, follow-up closely with your orthopedic specialist next week for outpatient care.

## 2024-10-08 NOTE — Progress Notes (Signed)
 Orthopedic Tech Progress Note Patient Details:  DWANE ANDRES [birth year] [id number]  Ortho Devices Type of Ortho Device: Short leg splint, Post (short leg) splint Ortho Device/Splint Location: LLE Ortho Device/Splint Interventions: Ordered, Application, Adjustment   Post Interventions Patient Tolerated: Well Instructions Provided: Care of device, Adjustment of device  Adine MARLA Blush 10/08/2024, 1:09 PM

## 2024-10-08 NOTE — ED Triage Notes (Signed)
 Pt BIBA from home for fall, left ankle started bruising immediately. Ankle very swollen and painful. Got dizzy and fell, not dizzy right now. Can barely bear weight. No LOC, no head trauma, no thinners. 50mg  benadryl , 100mcg fentanyl  total. Benadryl  given for possible fentanyl  allergy. 18ga rf  70 hr 100% ra 142/80

## 2024-10-12 NOTE — Telephone Encounter (Unsigned)
 Copied from CRM #[id number]. Topic: Clinical - Medical Advice >> Oct 11, 2024  4:40 PM Shereese L wrote: Reason for CRM: Patient called in to adv that the FMLA papers were sent and needs to be acknowledged and sent back

## 2024-10-13 ENCOUNTER — Other Ambulatory Visit: Payer: Self-pay | Admitting: Student in an Organized Health Care Education/Training Program

## 2024-10-13 DIAGNOSIS — G43709 Chronic migraine without aura, not intractable, without status migrainosus: Secondary | ICD-10-CM

## 2024-10-14 ENCOUNTER — Encounter (HOSPITAL_BASED_OUTPATIENT_CLINIC_OR_DEPARTMENT_OTHER): Payer: Self-pay

## 2024-10-14 ENCOUNTER — Emergency Department (HOSPITAL_BASED_OUTPATIENT_CLINIC_OR_DEPARTMENT_OTHER)
Admission: EM | Admit: 2024-10-14 | Discharge: 2024-10-14 | Disposition: A | Discharge status: Home / Self Care | Attending: Emergency Medicine | Admitting: Emergency Medicine

## 2024-10-14 ENCOUNTER — Other Ambulatory Visit: Payer: Self-pay

## 2024-10-14 ENCOUNTER — Emergency Department (HOSPITAL_BASED_OUTPATIENT_CLINIC_OR_DEPARTMENT_OTHER)

## 2024-10-14 DIAGNOSIS — S92002D Unspecified fracture of left calcaneus, subsequent encounter for fracture with routine healing: Secondary | ICD-10-CM | POA: Diagnosis not present

## 2024-10-14 DIAGNOSIS — X58XXXD Exposure to other specified factors, subsequent encounter: Secondary | ICD-10-CM | POA: Insufficient documentation

## 2024-10-14 DIAGNOSIS — S92252D Displaced fracture of navicular [scaphoid] of left foot, subsequent encounter for fracture with routine healing: Secondary | ICD-10-CM | POA: Insufficient documentation

## 2024-10-14 DIAGNOSIS — M79672 Pain in left foot: Secondary | ICD-10-CM | POA: Diagnosis present

## 2024-10-14 DIAGNOSIS — S92255A Nondisplaced fracture of navicular [scaphoid] of left foot, initial encounter for closed fracture: Secondary | ICD-10-CM | POA: Diagnosis not present

## 2024-10-14 DIAGNOSIS — S92255D Nondisplaced fracture of navicular [scaphoid] of left foot, subsequent encounter for fracture with routine healing: Secondary | ICD-10-CM | POA: Diagnosis not present

## 2024-10-14 DIAGNOSIS — S92215A Nondisplaced fracture of cuboid bone of left foot, initial encounter for closed fracture: Secondary | ICD-10-CM | POA: Diagnosis not present

## 2024-10-14 DIAGNOSIS — S92025A Nondisplaced fracture of anterior process of left calcaneus, initial encounter for closed fracture: Secondary | ICD-10-CM | POA: Diagnosis not present

## 2024-10-14 DIAGNOSIS — S92215D Nondisplaced fracture of cuboid bone of left foot, subsequent encounter for fracture with routine healing: Secondary | ICD-10-CM | POA: Insufficient documentation

## 2024-10-14 DIAGNOSIS — Z043 Encounter for examination and observation following other accident: Secondary | ICD-10-CM | POA: Diagnosis not present

## 2024-10-14 MED ORDER — OXYCODONE-ACETAMINOPHEN 5-325 MG PO TABS
1.0000 | ORAL_TABLET | Freq: Once | ORAL | Status: AC
  Administered 2024-10-14: 1 via ORAL
  Filled 2024-10-14: qty 1

## 2024-10-14 MED ORDER — OXYCODONE-ACETAMINOPHEN 5-325 MG PO TABS: 1.0000 | ORAL_TABLET | Freq: Four times a day (QID) | ORAL | 0 refills | Status: DC | PRN

## 2024-10-14 NOTE — ED Triage Notes (Signed)
 Pt reports breaking foot x1 week ago. Pt not in any cast, reports cast got wet and he took it off.

## 2024-10-14 NOTE — ED Provider Notes (Signed)
 Alan Sanders Provider Note   CSN: [id number] Arrival date & time: 10/14/24  2124     Patient presents with: Foot Pain   Alan Sanders is a 53 y.o. male past medical history significant for GERD, seizures, gait instability who presents after twist and fall with left foot fracture 1 week ago.  Patient reports that he went to take a shower today and cast got wet so he took it off.  He also endorses some left knee pain, reports that his left knee pain was not addressed at the time because he was more focused on the foot.  Rates pain 9/10.  He reports some irritation and itching at the site of the cast.    Foot Pain       Prior to Admission medications   Medication Sig Start Date End Date Taking? Authorizing Provider  dicyclomine  (BENTYL ) 20 MG tablet Take 1 tablet (20 mg total) by mouth 3 (three) times daily before meals. 09/13/24   Jerrell Cleatus Ned, MD  DULoxetine  (CYMBALTA ) 30 MG capsule Take 1 capsule (30 mg total) by mouth daily. 09/13/24   Jerrell Cleatus Ned, MD  gabapentin  (NEURONTIN ) 300 MG capsule TAKE 1 CAPSULE BY MOUTH THREE TIMES A DAY 08/05/24   Jerrell Cleatus Ned, MD  hyoscyamine  (LEVSIN  SL) 0.125 MG SL tablet Place 1-2 tablets (0.125-0.25 mg total) under the tongue every 4 (four) hours as needed for cramping (abdominal pain). 09/26/24   Haze Lonni PARAS, MD  omeprazole  (PRILOSEC) 20 MG capsule Take 1 capsule (20 mg total) by mouth daily. 06/23/24   Horton, Charmaine FALCON, MD  oxyCODONE -acetaminophen  (PERCOCET/ROXICET) 5-325 MG tablet Take 1 tablet by mouth every 6 (six) hours as needed for severe pain (pain score 7-10). 10/08/24   Nivia Colon, PA-C  SUMAtriptan  (IMITREX ) 25 MG tablet TAKE 1 TABLET BY MOUTH AS NEEDED FOR MIGRAINE. MAY REPEAT IN 2 HOURS IF HEADACHE PERSISTS/RECURS 10/13/24   Jerrell Cleatus Ned, MD    Allergies: Fentanyl  and Hydromorphone     Review of Systems  All other systems reviewed and are  negative.   Updated Vital Signs BP 129/77 (BP Location: Left Arm)   Pulse (!) 103   Temp (!) 97.5 F (36.4 C)   Resp 20   Ht 6' 1 (1.854 m)   Wt 103.4 kg   SpO2 100%   BMI 30.08 kg/m   Physical Exam Vitals and nursing note reviewed.  Constitutional:      General: He is not in acute distress.    Appearance: Normal appearance.  HENT:     Head: Normocephalic and atraumatic.  Eyes:     General:        Right eye: No discharge.        Left eye: No discharge.  Cardiovascular:     Rate and Rhythm: Normal rate and regular rhythm.     Pulses: Normal pulses.  Pulmonary:     Effort: Pulmonary effort is normal. No respiratory distress.  Musculoskeletal:        General: Swelling and deformity present.     Comments: Skin tenderness to palpation, soft tissue swelling, with some mild redness noted to left foot, tenderness along the left lateral aspect of the foot, calcaneus.  Consistent with known fracture.  Mild tenderness palpation of the left knee with no step-off, deformity, normal range of motion to flexion, extension.  Skin:    General: Skin is warm and dry.     Capillary Refill: Capillary refill takes  less than 2 seconds.  Neurological:     Mental Status: He is alert and oriented to person, place, and time.  Psychiatric:        Mood and Affect: Mood normal.        Behavior: Behavior normal.     (all labs ordered are listed, but only abnormal results are displayed) Labs Reviewed - No data to display  EKG: None  Radiology: CT Foot Left Wo Contrast Result Date: 10/14/2024 CLINICAL DATA:  Trauma EXAM: CT OF THE LEFT FOOT WITHOUT CONTRAST TECHNIQUE: Multidetector CT imaging of the left foot was performed according to the standard protocol. Multiplanar CT image reconstructions were also generated. RADIATION DOSE REDUCTION: This exam was performed according to the departmental dose-optimization program which includes automated exposure control, adjustment of the mA and/or kV  according to patient size and/or use of iterative reconstruction technique. COMPARISON:  Left ankle x-ray 10/09/2024 FINDINGS: Bones/Joint/Cartilage Evaluation of the toes is limited secondary to motion artifact. There is a comminuted nondisplaced fracture of the medial in mid aspect of the navicular bone. There is avulsion fracture which is nondisplaced over the dorsal aspect of the navicular bone. There is nondisplaced fracture of the anterior process of the calcaneus laterally. There is nondisplaced fracture of the lateral aspect of the cuboid. Alignment is anatomic. There is no dislocation. Ligaments Suboptimally assessed by CT. Muscles and Tendons There is some intramuscular edema adjacent to fractures. Soft tissues There is subcutaneous edema and swelling adjacent to the fractures. No focal hematoma or foreign body. IMPRESSION: 1. Comminuted nondisplaced fracture of the medial and mid aspect of the navicular bone. 2. Nondisplaced avulsion fracture of the dorsal aspect of the navicular bone. 3. Nondisplaced fracture of the anterior process of the calcaneus laterally. 4. Nondisplaced fracture of the lateral aspect of the cuboid. Electronically Signed   By: Greig Pique M.D.   On: 10/14/2024 22:28   DG Knee Complete 4 Views Left Result Date: 10/14/2024 EXAM: 4 VIEW(S) XRAY OF THE LEFT KNEE 10/14/2024 10:15:48 PM COMPARISON: None available. CLINICAL HISTORY: FALL FINDINGS: BONES AND JOINTS: No acute fracture. No joint dislocation. No significant joint effusion. No significant degenerative changes. SOFT TISSUES: The soft tissues are unremarkable. IMPRESSION: 1. No evidence of acute traumatic injury. Electronically signed by: Oneil Devonshire MD 10/14/2024 10:19 PM EST RP Workstation: [id number]     Procedures   Medications Ordered in the ED  oxyCODONE -acetaminophen  (PERCOCET/ROXICET) 5-325 MG per tablet 1 tablet (1 tablet Oral Given 10/14/24 2214)                                    Medical Decision  Making Amount and/or Complexity of Data Reviewed Radiology: ordered.  Risk Prescription drug management.   This patient is a 53 y.o. male who presents to the ED for concern of foot pain, known foot fracture, knee pain   Differential diagnoses prior to evaluation: Known fracture, possible associate injury, complication, dislocation  Past Medical History / Social History / Additional history: Chart reviewed. Pertinent results include: GERD, seizures, gait instability  Physical Exam: Physical exam performed. The pertinent findings include: Skin tenderness to palpation, soft tissue swelling, with some mild redness noted to left foot, tenderness along the left lateral aspect of the foot, calcaneus.  Consistent with known fracture.  Mild tenderness palpation of the left knee with no step-off, deformity, normal range of motion to flexion, extension.   Neurovascularly intact  Imaging:  I independently interpreted imaging including CT left foot wo contrast, plain film radiograph of the left knee which shows  1. Comminuted nondisplaced fracture of the medial and mid aspect of  the navicular bone.  2. Nondisplaced avulsion fracture of the dorsal aspect of the  navicular bone.  3. Nondisplaced fracture of the anterior process of the calcaneus  laterally.  4. Nondisplaced fracture of the lateral aspect of the cuboid.  . I agree with the radiologist interpretation.  Medications / Treatment: Replaced short leg splint patient stable for discharge, replaced his splint and encouraged orthopedic follow-up as previously discussed in prior emergency department visit.   Disposition: After consideration of the diagnostic results and the patients response to treatment, I feel that patient is stable for discharge with plan as above.   emergency department workup does not suggest an emergent condition requiring admission or immediate intervention beyond what has been performed at this time. The plan is:  as above. The patient is safe for discharge and has been instructed to return immediately for worsening symptoms, change in symptoms or any other concerns.   Final diagnoses:  Closed displaced fracture of navicular bone of left foot with routine healing, subsequent encounter  Closed displaced fracture of left calcaneus with routine healing, unspecified portion of calcaneus, subsequent encounter  Closed nondisplaced fracture of cuboid of left foot with routine healing, subsequent encounter    ED Discharge Orders     None          Rosan Sherlean DEL, PA-C 10/14/24 2314    Pamella Ozell LABOR, DO 10/18/24 1153

## 2024-10-14 NOTE — ED Notes (Signed)
 PT stated he has crutches in his truck. Has been using them the past week and feels comfortable with them.

## 2024-10-14 NOTE — Discharge Instructions (Signed)
 Continue your home ibuprofen , Tylenol , narcotic pain medication as needed.  Follow-up with the orthopedic physician as discussed previously.  Please return if you have severe worsening pain in the affected extremity despite treatment.

## 2024-10-17 ENCOUNTER — Encounter: Payer: Self-pay | Admitting: Student in an Organized Health Care Education/Training Program

## 2024-10-17 ENCOUNTER — Ambulatory Visit: Admitting: Student in an Organized Health Care Education/Training Program

## 2024-10-17 VITALS — BP 121/88 | HR 78 | Wt 208.0 lb

## 2024-10-17 DIAGNOSIS — S92255D Nondisplaced fracture of navicular [scaphoid] of left foot, subsequent encounter for fracture with routine healing: Secondary | ICD-10-CM | POA: Diagnosis not present

## 2024-10-17 NOTE — Progress Notes (Signed)
 Acute Office Visit  Patient ID: Alan Sanders, male    DOB: 1971/08/12, 53 y.o.   MRN: [id number]  PCP: Jerrell Cleatus Ned, MD  Chief Complaint  Patient presents with   Foot Injury    Red and swollen left foot and painful.  Was seen in the ED Friday night due to burning sensation. Foot was wrapped but the wrap fell off.     Subjective:     HPI  Discussed the use of AI scribe software for clinical note transcription with the patient, who gave verbal consent to proceed.  History of Present Illness Alan Sanders is a 53 year old male who presents with a recent left foot fracture. He is accompanied by his son, Alan Sanders.  He sustained a left foot fracture approximately two Saturdays ago after falling at home while getting up to use the bathroom. He was unable to call for help immediately as he could not go downstairs, and his father was asleep. He eventually sought medical attention when his father woke up.  He visited the hospital on the Friday following the injury due to severe burning pain in the foot. He reports that the ER doctor told him he had a fracture of the medial and mid aspect of the navicular bone, an avulsion fracture of the navicular bone, a nondisplaced fracture of the anterior calcaneus, and a nondisplaced fracture of the cuboid. His foot was initially wrapped at the hospital, but the wrapping repeatedly came off, leading him to leave it off eventually.  He has been bearing weight on the foot despite the pain and has been using crutches, although he dislikes them. He has a boot at home from a previous injury but is unsure if it is suitable for the current fracture. He is unable to see his foot doctor until December 3rd due to scheduling issues.  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 He is concerned about returning to work, as he needs to be on his feet, although he mentions the possibility of  sitting at a desk for his duties.  He experiences pain in the left foot, especially when bearing weight, and has difficulty with mobility due to the injury.      Objective:    BP 121/88   Pulse 78   Wt 208 lb (94.3 kg)   BMI 27.44 kg/m   Physical Exam  Gen: Well-appearing man Psych: Appropriate mood and affect, accompanied by his son today, not anxious or depressed appearing MSK: Left foot is erythematous across the midfoot, tender to palpation, no skin breakdown or deformity Neuro: Antalgic gait, using a cane to help walk because of left foot pain, full strength in upper lower extremities     Assessment & Plan:   Problem List Items Addressed This Visit       Unprioritized   Closed navicular fracture of left foot - Primary   Fractures include medial and mid navicular, avulsion fracture of navicular, and nondisplaced fractures of anterior calcaneus and cuboid.  I think this is a reinjury of a prior fracture in the midfoot that we were managing with orthopedics over the summertime.  No displacement observed, on CT scan 11/21.  The fall at home occurred around 11/15.  There is a risk of flat foot if the arch is unsupported. A left cam walker boot was ordered for weight distribution. He is advised against weight-bearing until the podiatrist evaluation  on December 3rd. Light duty work is recommended, with no standing or walking.      Relevant Orders   For home use only DME Other see comment    Return if symptoms worsen or fail to improve.  Cleatus Debby Specking, MD [facility]

## 2024-10-17 NOTE — Assessment & Plan Note (Signed)
 Fractures include medial and mid navicular, avulsion fracture of navicular, and nondisplaced fractures of anterior calcaneus and cuboid.  I think this is a reinjury of a prior fracture in the midfoot that we were managing with orthopedics over the summertime.  No displacement observed, on CT scan 11/21.  The fall at home occurred around 11/15.  There is a risk of flat foot if the arch is unsupported. A left cam walker boot was ordered for weight distribution. He is advised against weight-bearing until the podiatrist evaluation on December 3rd. Light duty work is recommended, with no standing or walking.

## 2024-10-17 NOTE — Patient Instructions (Signed)
  VISIT SUMMARY: Today, you visited the clinic due to a recent left foot fracture. You sustained the injury after a fall at home and have been experiencing significant pain, especially when bearing weight. You have been using crutches and have a boot at home, but you were unsure if it was suitable for your current injury. 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  YOUR PLAN: -FRACTURES OF LEFT FOOT (NAVICULAR, CALCANEUS, CUBOID): You have multiple fractures in your left foot, including the navicular, calcaneus, and cuboid bones. These fractures are not displaced, so surgery is not needed. However, there is a risk of developing flat foot if the arch is not supported. You have been provided with a left cam walker boot to help distribute weight and support your foot. It is important that you avoid putting weight on your foot until you see the podiatrist on December 3rd. You are advised to perform light duty work that does not require standing or walking.  INSTRUCTIONS: Please avoid putting weight on your left foot until your podiatrist evaluation on December 3rd. Use the left cam walker boot provided to support your foot. If you need to work, ensure that your duties are light and do not involve standing or walking.

## 2024-10-18 ENCOUNTER — Telehealth: Payer: Self-pay

## 2024-10-18 NOTE — Telephone Encounter (Signed)
 Patient is coming in this afternoon to pick up letter, placed in file folder

## 2024-10-18 NOTE — Telephone Encounter (Signed)
 Copied from CRM #[id number]. Topic: General - Other >> Oct 18, 2024 10:03 AM Sophia H wrote: Reason for CRM: Patient is requesting updated doctors note - states he needs it to state he can return Saturday 11/29 on light duty. Please reach out, states he can pick up from office. # [phone]

## 2024-10-18 NOTE — Telephone Encounter (Signed)
 Please advise

## 2024-10-18 NOTE — Telephone Encounter (Signed)
 I updated the letter from yesterday's encounter.  Thank you.

## 2024-10-22 ENCOUNTER — Encounter (HOSPITAL_BASED_OUTPATIENT_CLINIC_OR_DEPARTMENT_OTHER): Payer: Self-pay

## 2024-10-22 ENCOUNTER — Emergency Department (HOSPITAL_BASED_OUTPATIENT_CLINIC_OR_DEPARTMENT_OTHER)

## 2024-10-22 ENCOUNTER — Emergency Department (HOSPITAL_BASED_OUTPATIENT_CLINIC_OR_DEPARTMENT_OTHER)
Admission: EM | Admit: 2024-10-22 | Discharge: 2024-10-22 | Disposition: A | Discharge status: Home / Self Care | Attending: Emergency Medicine | Admitting: Emergency Medicine

## 2024-10-22 DIAGNOSIS — K859 Acute pancreatitis without necrosis or infection, unspecified: Secondary | ICD-10-CM | POA: Diagnosis not present

## 2024-10-22 DIAGNOSIS — R7401 Elevation of levels of liver transaminase levels: Secondary | ICD-10-CM | POA: Insufficient documentation

## 2024-10-22 DIAGNOSIS — K8500 Idiopathic acute pancreatitis without necrosis or infection: Secondary | ICD-10-CM | POA: Insufficient documentation

## 2024-10-22 DIAGNOSIS — R161 Splenomegaly, not elsewhere classified: Secondary | ICD-10-CM | POA: Diagnosis not present

## 2024-10-22 DIAGNOSIS — R739 Hyperglycemia, unspecified: Secondary | ICD-10-CM | POA: Diagnosis not present

## 2024-10-22 DIAGNOSIS — R1013 Epigastric pain: Secondary | ICD-10-CM | POA: Diagnosis present

## 2024-10-22 DIAGNOSIS — R748 Abnormal levels of other serum enzymes: Secondary | ICD-10-CM | POA: Insufficient documentation

## 2024-10-22 LAB — COMPREHENSIVE METABOLIC PANEL WITH GFR
ALT: 48 U/L — ABNORMAL HIGH (ref 0–44)
AST: 39 U/L (ref 15–41)
Albumin: 4.8 g/dL (ref 3.5–5.0)
Alkaline Phosphatase: 110 U/L (ref 38–126)
Anion gap: 11 (ref 5–15)
BUN: 8 mg/dL (ref 6–20)
CO2: 29 mmol/L (ref 22–32)
Calcium: 10.4 mg/dL — ABNORMAL HIGH (ref 8.9–10.3)
Chloride: 100 mmol/L (ref 98–111)
Creatinine, Ser: 0.67 mg/dL (ref 0.61–1.24)
GFR, Estimated: >60 mL/min (ref >60)
Glucose, Bld: 136 mg/dL — ABNORMAL HIGH (ref 70–99)
Potassium: 3.7 mmol/L (ref 3.5–5.1)
Sodium: 139 mmol/L (ref 135–145)
Total Bilirubin: 0.6 mg/dL (ref 0.0–1.2)
Total Protein: 7.9 g/dL (ref 6.5–8.1)

## 2024-10-22 LAB — CBC
HCT: 44.2 % (ref 39.0–52.0)
Hemoglobin: 15.5 g/dL (ref 13.0–17.0)
MCH: 29 pg (ref 26.0–34.0)
MCHC: 35.1 g/dL (ref 30.0–36.0)
MCV: 82.8 fL (ref 80.0–100.0)
Platelets: 150 K/uL (ref 150–400)
RBC: 5.34 MIL/uL (ref 4.22–5.81)
RDW: 13.4 % (ref 11.5–15.5)
WBC: 6.7 K/uL (ref 4.0–10.5)
nRBC: 0 % (ref 0.0–0.2)

## 2024-10-22 LAB — URINALYSIS, ROUTINE W REFLEX MICROSCOPIC
Bilirubin Urine: NEGATIVE
Glucose, UA: NEGATIVE mg/dL
Hgb urine dipstick: NEGATIVE
Ketones, ur: NEGATIVE mg/dL
Leukocytes,Ua: NEGATIVE
Nitrite: NEGATIVE
Specific Gravity, Urine: 1.022 (ref 1.005–1.030)
pH: 7 (ref 5.0–8.0)

## 2024-10-22 LAB — LIPASE, BLOOD: Lipase: 185 U/L — ABNORMAL HIGH (ref 11–51)

## 2024-10-22 MED ORDER — ONDANSETRON 4 MG PO TBDP: 4.0000 mg | ORAL_TABLET | Freq: Three times a day (TID) | ORAL | 0 refills | Status: DC | PRN

## 2024-10-22 MED ORDER — ONDANSETRON HCL 4 MG/2ML IJ SOLN
4.0000 mg | Freq: Once | INTRAMUSCULAR | Status: AC
  Administered 2024-10-22: 4 mg via INTRAVENOUS
  Filled 2024-10-22: qty 2

## 2024-10-22 MED ORDER — OXYCODONE HCL 5 MG PO TABS
5.0000 mg | ORAL_TABLET | Freq: Four times a day (QID) | ORAL | 0 refills | Status: AC | PRN
Start: 2024-10-22 — End: 2024-10-25

## 2024-10-22 MED ORDER — KETOROLAC TROMETHAMINE 15 MG/ML IJ SOLN
15.0000 mg | Freq: Once | INTRAMUSCULAR | Status: AC
  Administered 2024-10-22: 15 mg via INTRAVENOUS
  Filled 2024-10-22: qty 1

## 2024-10-22 MED ORDER — LACTATED RINGERS IV BOLUS
1000.0000 mL | Freq: Once | INTRAVENOUS | Status: AC
  Administered 2024-10-22: 1000 mL via INTRAVENOUS

## 2024-10-22 MED ORDER — OXYCODONE-ACETAMINOPHEN 5-325 MG PO TABS
1.0000 | ORAL_TABLET | Freq: Once | ORAL | Status: AC
  Administered 2024-10-22: 1 via ORAL
  Filled 2024-10-22: qty 1

## 2024-10-22 MED ORDER — IOHEXOL 300 MG/ML  SOLN
100.0000 mL | Freq: Once | INTRAMUSCULAR | Status: AC | PRN
  Administered 2024-10-22: 100 mL via INTRAVENOUS

## 2024-10-22 NOTE — ED Triage Notes (Signed)
 Pt reports ongoing vomiting since Thanksgiving night, worsening. Endorses headache, umbilical/LLQ/LUQ pain. Reports unable to tolerate PO intake, constipation, nosebleed this morning. Last BM last night per pt. Hx enlarged spleen, headaches. Reports grandson is sick contact. Took pepto bismol PTA.

## 2024-10-22 NOTE — ED Notes (Signed)
 Pt requesting how much longer, EDP Alan notified

## 2024-10-22 NOTE — ED Notes (Signed)
 Pt CAOx4 with family at bedside. Call bell within reach.

## 2024-10-22 NOTE — Discharge Instructions (Addendum)
 You were found to have pancreatitis which is inflammation of your pancreas today.  This seems to be the source of your pain.  Your CT scan did not show any other abnormalities to explain your pain.  Your kidney, liver, and gallbladder labs were normal.  Your urine did not show any signs of infection.  Please drink plenty of fluids such as water and Gatorade to keep your urine a light yellow color.  Eat a bland diet with foods such as broth, toast, crackers for the next 2 to 3 days, then advance your diet as tolerated.  You may take up to 1000mg  of tylenol  every 6 hours as needed for pain.  Do not take more then 4g per day.  You may use up to 600mg  ibuprofen  every 6 hours as needed for pain.  Do not exceed 2.4g of ibuprofen  per day.  You were given your first dose here today, your next dose can be no sooner than 6:15 PM  You have been prescribed Oxycodone -this is a narcotic/controlled substance medication that has potential addicting qualities.  You may take 1 tablet every 6 hours as needed for severe pain not controlled with Tylenol  and ibuprofen .  Do not drive or operate heavy machinery when taking this medicine as it can be sedating. Do not drink alcohol or take other sedating medications when taking this medicine for safety reasons.  Keep this out of reach of small children.    You have been prescribed Zofran  (ondansetron ) for nausea and vomiting. You may take this every 8 hours as needed for nausea and vomiting. This medication dissolves under the tongue. You do not need to swallow it.  You were given your first dose here today, your next dose can be no sooner than 8:15 PM tonight  Please schedule a follow-up appointment with your primary care provider if your symptoms are not starting to improve within the next week  Please return to the emergency room for any severe worsening of pain, persistent vomiting, fevers, any other new or concerning symptoms

## 2024-10-22 NOTE — ED Provider Notes (Signed)
 Alan Sanders Provider Note   CSN: [id number] Arrival date & time: 10/22/24  1050     Patient presents with: Abdominal Pain and Emesis   Alan Sanders is a 53 y.o. male with history of inguinal hernia, IBS, enlarged spleen, presents with concern for vomiting and abdominal pain that has been ongoing for the past 3 days.  He reports symptoms started after eating his Thanksgiving meal.  He had a couple episodes of vomiting where the food he ate came back up.  He started to develop left upper quadrant and epigastric abdominal pain soon after.  This pain is fairly constant.  Does not seem to radiate elsewhere.  Denies any chest pain or shortness of breath.  He reports another episode of vomiting after eating chili yesterday.  He denies any dysuria, hematuria, or increased frequency.  Denies any flank pain, fevers, or chills.  Denies alcohol use.    Abdominal Pain Associated symptoms: vomiting   Emesis Associated symptoms: abdominal pain        Prior to Admission medications   Medication Sig Start Date End Date Taking? Authorizing Provider  ondansetron  (ZOFRAN -ODT) 4 MG disintegrating tablet Take 1 tablet (4 mg total) by mouth every 8 (eight) hours as needed for nausea or vomiting. 10/22/24  Yes Veta Palma, PA-C  oxyCODONE  (ROXICODONE ) 5 MG immediate release tablet Take 1 tablet (5 mg total) by mouth every 6 (six) hours as needed for up to 3 days for severe pain (pain score 7-10) or breakthrough pain (Pain not controlled with Tylenol  and ibuprofen ). 10/22/24 10/25/24 Yes Veta Palma, PA-C  dicyclomine  (BENTYL ) 20 MG tablet Take 1 tablet (20 mg total) by mouth 3 (three) times daily before meals. 09/13/24   Jerrell Cleatus Ned, MD  DULoxetine  (CYMBALTA ) 30 MG capsule Take 1 capsule (30 mg total) by mouth daily. 09/13/24   Jerrell Cleatus Ned, MD  gabapentin  (NEURONTIN ) 300 MG capsule TAKE 1 CAPSULE BY MOUTH THREE TIMES A DAY 08/05/24    Jerrell Cleatus Ned, MD  hyoscyamine  (LEVSIN  SL) 0.125 MG SL tablet Place 1-2 tablets (0.125-0.25 mg total) under the tongue every 4 (four) hours as needed for cramping (abdominal pain). 09/26/24   Haze Lonni PARAS, MD  omeprazole  (PRILOSEC) 20 MG capsule Take 1 capsule (20 mg total) by mouth daily. 06/23/24   Horton, Charmaine FALCON, MD  SUMAtriptan  (IMITREX ) 25 MG tablet TAKE 1 TABLET BY MOUTH AS NEEDED FOR MIGRAINE. MAY REPEAT IN 2 HOURS IF HEADACHE PERSISTS/RECURS 10/13/24   Jerrell Cleatus Ned, MD    Allergies: Fentanyl  and Hydromorphone     Review of Systems  Gastrointestinal:  Positive for abdominal pain and vomiting.    Updated Vital Signs BP 129/79 (BP Location: Right Arm)   Pulse 69   Temp 98.9 F (37.2 C)   Resp 16   SpO2 100%   Physical Exam Vitals and nursing note reviewed.  Constitutional:      General: He is not in acute distress.    Appearance: He is well-developed.     Comments: No active vomiting noted  HENT:     Head: Normocephalic and atraumatic.  Eyes:     Conjunctiva/sclera: Conjunctivae normal.  Cardiovascular:     Rate and Rhythm: Normal rate and regular rhythm.     Heart sounds: No murmur heard. Pulmonary:     Effort: Pulmonary effort is normal. No respiratory distress.     Breath sounds: Normal breath sounds.  Abdominal:     Palpations: Abdomen is  soft.     Tenderness: There is no abdominal tenderness.     Comments: Abdomen is distended but still soft.  Patient with left upper quadrant and epigastric tenderness without rebound or guarding  Musculoskeletal:        General: No swelling.     Cervical back: Neck supple.  Skin:    General: Skin is warm and dry.     Capillary Refill: Capillary refill takes less than 2 seconds.  Neurological:     Mental Status: He is alert.  Psychiatric:        Mood and Affect: Mood normal.     (all labs ordered are listed, but only abnormal results are displayed) Labs Reviewed  LIPASE, BLOOD - Abnormal;  Notable for the following components:      Result Value   Lipase 185 (*)    All other components within normal limits  COMPREHENSIVE METABOLIC PANEL WITH GFR - Abnormal; Notable for the following components:   Glucose, Bld 136 (*)    Calcium  10.4 (*)    ALT 48 (*)    All other components within normal limits  URINALYSIS, ROUTINE W REFLEX MICROSCOPIC - Abnormal; Notable for the following components:   Protein, ur TRACE (*)    All other components within normal limits  CBC    EKG: None  Radiology: CT ABDOMEN PELVIS W CONTRAST Result Date: 10/22/2024 CLINICAL DATA:  Acute pancreatitis. EXAM: CT ABDOMEN AND PELVIS WITH CONTRAST TECHNIQUE: Multidetector CT imaging of the abdomen and pelvis was performed using the standard protocol following bolus administration of intravenous contrast. RADIATION DOSE REDUCTION: This exam was performed according to the departmental dose-optimization program which includes automated exposure control, adjustment of the mA and/or kV according to patient size and/or use of iterative reconstruction technique. CONTRAST:  OMNIPAQUE  IOHEXOL  300 MG/ML  SOLN COMPARISON:  08/23/2024 FINDINGS: Lower Chest: No acute findings. Hepatobiliary: No suspicious hepatic masses identified. Gallbladder is unremarkable. No evidence of biliary ductal dilatation. Pancreas: Normal appearance. No signs of acute or chronic pancreatitis. No evidence of pancreatic mass or ductal dilatation. Spleen: Within normal limits in size and appearance. Stable mild splenomegaly. Adrenals/Urinary Tract: No suspicious masses identified. No evidence of ureteral calculi or hydronephrosis. Stomach/Bowel: No evidence of obstruction, inflammatory process or abnormal fluid collections. Normal appendix visualized. Vascular/Lymphatic: No pathologically enlarged lymph nodes. No acute vascular findings. Reproductive:  No mass or other significant abnormality. Other: Prior right inguinal hernia repair. No evidence  of recurrent hernia. Musculoskeletal:  No suspicious bone lesions identified. IMPRESSION: No radiographic evidence of pancreatitis or other acute findings. Stable mild splenomegaly. Electronically Signed   By: Norleen DELENA Kil M.D.   On: 10/22/2024 12:57     Procedures   Medications Ordered in the ED  ondansetron  (ZOFRAN ) injection 4 mg (4 mg Intravenous Given 10/22/24 1233)  lactated ringers  bolus 1,000 mL (0 mLs Intravenous Stopped 10/22/24 1344)  ketorolac  (TORADOL ) 15 MG/ML injection 15 mg (15 mg Intravenous Given 10/22/24 1234)  oxyCODONE -acetaminophen  (PERCOCET/ROXICET) 5-325 MG per tablet 1 tablet (1 tablet Oral Given 10/22/24 1235)  iohexol  (OMNIPAQUE ) 300 MG/ML solution 100 mL (100 mLs Intravenous Contrast Given 10/22/24 1222)  lactated ringers  bolus 1,000 mL (0 mLs Intravenous Stopped 10/22/24 1550)    Clinical Course as of 10/22/24 1721  Sat Oct 22, 2024  1701 Patient's postvoid residual is 0 mL, no concern for acute urinary retention at this time [AF]  1703 Patient p.o. challenged with water and has tolerated this well.  No active vomiting. [AF]  Clinical Course User Index [AF] Veta Palma, PA-C                                 Medical Decision Making Amount and/or Complexity of Data Reviewed Labs: ordered. Radiology: ordered.  Risk Prescription drug management.     Differential diagnosis includes but is not limited to Cannabinoid hyperemesis syndrome, acute cholecystitis, cholelithiasis, cholangitis, choledocholithiasis, peptic ulcer, gastritis, gastroenteritis, appendicitis, IBS, IBD, DKA, nephrolithiasis, UTI, pyelonephritis, pancreatitis, diverticulitis, mesenteric ischemia, abdominal aortic aneurysm, small bowel obstruction, volvulus, testicular torsion in males   ED Course:  Upon initial evaluation, patient is well-appearing, no acute distress.  Normal vital signs.  He is slightly tender to the left upper quadrant and epigastric region on initial exam.   Reporting nausea, but I do not appreciate any active vomiting.  Patient was reporting some difficulty with urination, but patient's postvoid residual is 0 mL.  No concern for acute urinary retention at this time.  Labs Ordered: I Ordered, and personally interpreted labs.  The pertinent results include:   CBC within normal limits. CMP with elevated glucose at 136, elevated ALT at 48, otherwise unremarkable Lipase elevated at 185 Urinalysis without evidence of infection  Imaging Studies ordered: I ordered imaging studies including CT abdomen and pelvis I independently visualized the imaging with scope of interpretation limited to determining acute life threatening conditions related to emergency care. Imaging showed  IMPRESSION:  No radiographic evidence of pancreatitis or other acute findings.    Stable mild splenomegaly.   I agree with the radiologist interpretation  Medications Given: 2L LR Toradol  Percocet Zofran   Upon re-evaluation, patient remains well-appearing with stable vitals.  Reports pain is improved slightly with the medications and fluids, but still present.  He feels this would be able to be managed at home.  Based on his lipase being elevated at 185, and patient having epigastric/left upper quadrant pain, suspect he has pancreatitis.  Also considered gastroenteritis given symptoms seem to start after food intake.  His CT scan did not show any evidence of pancreatitis or complication.  CT scan did not show any other abnormalities  to explain his pain such as SBO.  Urinalysis without signs of infection.  Doubt cardiac etiology given no chest pain, shortness of breath, and vomiting ongoing for 3 days.  Patient was p.o. challenged with water and taught this well.  Drank an entire glass of water without any emesis.  Feel patient is stable and appropriate for discharge home at this time with treatment of pancreatitis/gastroenteritis.    Impression: Pancreatitis  Disposition:   The patient was discharged home with instructions to keep well-hydrated with water and Pedialyte at home.  Bland diet and then advance diet as tolerated.  Tylenol  and ibuprofen  as needed for pain.  Oxycodone  for breakthrough pain.  Zofran  as needed for nausea.  Follow-up with PCP if symptoms not improved within the next week. Return precautions given and patient verbalized understanding.    This chart was dictated using voice recognition software, Dragon. Despite the best efforts of this provider to proofread and correct errors, errors may still occur which can change documentation meaning.       Final diagnoses:  Idiopathic acute pancreatitis without infection or necrosis    ED Discharge Orders          Ordered    oxyCODONE  (ROXICODONE ) 5 MG immediate release tablet  Every 6 hours PRN  10/22/24 1701    ondansetron  (ZOFRAN -ODT) 4 MG disintegrating tablet  Every 8 hours PRN        10/22/24 1701               Veta Palma, PA-C 10/22/24 1721    Yolande Lamar BROCKS, MD 10/23/24 [phone]

## 2024-10-22 NOTE — ED Notes (Signed)
 Patient transported to CT

## 2024-10-24 ENCOUNTER — Ambulatory Visit: Admitting: Student in an Organized Health Care Education/Training Program

## 2024-10-26 DIAGNOSIS — S92215A Nondisplaced fracture of cuboid bone of left foot, initial encounter for closed fracture: Secondary | ICD-10-CM | POA: Diagnosis not present

## 2024-10-26 DIAGNOSIS — S82201D Unspecified fracture of shaft of right tibia, subsequent encounter for closed fracture with routine healing: Secondary | ICD-10-CM | POA: Diagnosis not present

## 2024-10-27 ENCOUNTER — Other Ambulatory Visit: Payer: Self-pay

## 2024-10-27 ENCOUNTER — Emergency Department (HOSPITAL_BASED_OUTPATIENT_CLINIC_OR_DEPARTMENT_OTHER)
Admission: EM | Admit: 2024-10-27 | Discharge: 2024-10-27 | Disposition: A | Discharge status: Home / Self Care | Attending: Emergency Medicine | Admitting: Emergency Medicine

## 2024-10-27 ENCOUNTER — Encounter (HOSPITAL_BASED_OUTPATIENT_CLINIC_OR_DEPARTMENT_OTHER): Payer: Self-pay | Admitting: Emergency Medicine

## 2024-10-27 DIAGNOSIS — S92902D Unspecified fracture of left foot, subsequent encounter for fracture with routine healing: Secondary | ICD-10-CM | POA: Diagnosis not present

## 2024-10-27 DIAGNOSIS — M79672 Pain in left foot: Secondary | ICD-10-CM | POA: Diagnosis present

## 2024-10-27 DIAGNOSIS — X58XXXD Exposure to other specified factors, subsequent encounter: Secondary | ICD-10-CM | POA: Insufficient documentation

## 2024-10-27 MED ORDER — OXYCODONE HCL 5 MG PO TABS: 5.0000 mg | ORAL_TABLET | ORAL | 0 refills | Status: DC | PRN

## 2024-10-27 NOTE — ED Triage Notes (Signed)
 Pt in with 10/10 pain to L foot, states he fractured it after a fall on 11/15 but has not been in a boot since insurance will not cover it. 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

## 2024-10-27 NOTE — Discharge Instructions (Signed)
 You were seen for your foot fractures in the emergency department.   At home, please wear the walking boot.  Take Tylenol  and ibuprofen  for your pain.  Please continue taking your gabapentin . You may also take the oxycodone  we have prescribed you for any breakthrough pain that may have.  Do not take this before driving or operating heavy machinery.  Do not take this medication with alcohol.  Check your MyChart online for the results of any tests that had not resulted by the time you left the emergency department.   Follow-up with your primary doctor in 2-3 days regarding your visit.    Return immediately to the emergency department if you experience any of the following: Worsening pain, or any other concerning symptoms.    Thank you for visiting our Emergency Department. It was a pleasure taking care of you today.

## 2024-10-27 NOTE — ED Provider Notes (Signed)
 [place] EMERGENCY DEPARTMENT AT [facility] Provider Note   CSN: [id number] Arrival date & time: 10/27/24  9353     Patient presents with: Foot Pain   Alan Sanders is a 53 y.o. male.   53 year old male with recent foot fracture on 10/08/2024 after a fall who presents emergency department with foot pain.  Patient reports that he has had persistent burning and pain on the sole of his foot.  Saw his foot doctor and was prescribed a nerve medicine but is unsure of what it was.  Says the pain has persisted.  Also went to get a walking boots but said that his insurance did not cover it and it was going to be over $90.  This is his third visit to the ED for this.  Twice his splint has gotten wet and he is had to have it replaced.  Based on the CT scan that he had he has a comminuted nondisplaced fracture of the medial and mid aspect of the navicular bone, an avulsion fracture of the navicular bone, nondisplaced fracture of the anterior process of the calcaneus laterally, and a nondisplaced fracture of the lateral aspect of the cuboid.       Prior to Admission medications   Medication Sig Start Date End Date Taking? Authorizing Provider  oxyCODONE  (ROXICODONE ) 5 MG immediate release tablet Take 1 tablet (5 mg total) by mouth every 4 (four) hours as needed for severe pain (pain score 7-10). 10/27/24  Yes Yolande Lamar BROCKS, MD  dicyclomine  (BENTYL ) 20 MG tablet Take 1 tablet (20 mg total) by mouth 3 (three) times daily before meals. 09/13/24   Jerrell Cleatus Ned, MD  DULoxetine  (CYMBALTA ) 30 MG capsule Take 1 capsule (30 mg total) by mouth daily. 09/13/24   Jerrell Cleatus Ned, MD  gabapentin  (NEURONTIN ) 300 MG capsule TAKE 1 CAPSULE BY MOUTH THREE TIMES A DAY 08/05/24   Jerrell Cleatus Ned, MD  hyoscyamine  (LEVSIN  SL) 0.125 MG SL tablet Place 1-2 tablets (0.125-0.25 mg total) under the tongue every 4 (four) hours as needed for cramping (abdominal pain). 09/26/24   Haze Lonni PARAS, MD  omeprazole  (PRILOSEC) 20 MG capsule Take 1 capsule (20 mg total) by mouth daily. 06/23/24   Horton, Charmaine FALCON, MD  ondansetron  (ZOFRAN -ODT) 4 MG disintegrating tablet Take 1 tablet (4 mg total) by mouth every 8 (eight) hours as needed for nausea or vomiting. 10/22/24   Veta Palma, PA-C  SUMAtriptan  (IMITREX ) 25 MG tablet TAKE 1 TABLET BY MOUTH AS NEEDED FOR MIGRAINE. MAY REPEAT IN 2 HOURS IF HEADACHE PERSISTS/RECURS 10/13/24   Jerrell Cleatus Ned, MD    Allergies: Fentanyl  and Hydromorphone     Review of Systems  Updated Vital Signs BP (!) 143/90 (BP Location: Right Arm)   Pulse (!) 106   Temp (!) 97.4 F (36.3 C) (Oral)   Resp 20   Wt 94.3 kg   SpO2 100%   BMI 27.44 kg/m   Physical Exam Musculoskeletal:     Comments: Tenderness to palpation along the dorsum of the foot as well as the lateral aspect.  Compartments of the foot soft.  DP pulse 2+.  Cap refill less than 2 seconds in all toes of the left foot.  No obvious deformities of the left foot.     (all labs ordered are listed, but only abnormal results are displayed) Labs Reviewed - No data to display  EKG: None  Radiology: No results found.   Procedures   Medications Ordered in  the ED - No data to display                                  Medical Decision Making Risk Prescription drug management.   53 year old male with a history of a recent foot fracture who presents to the emergency department with continued pain and request for walking boot  Initial Ddx:  Fracture, compartment syndrome, nerve injury, vascular injury  MDM/Course:  Patient presents to the emergency department with continued pain of his foot.  Has had a CT scan that did confirm multiple nondisplaced fractures as well as a comminuted nondisplaced fracture of his navicular bone.  I suspect this is causing most of his pain.  No signs of compartment syndrome right now.  No obvious deformities.  Is neurovascularly  intact otherwise.  Will give him a short course of pain medication and a walking boot here in the emergency department.  Will have him follow-up with his foot doctor as previously recommended.  Given a short work note as well.  This patient presents to the ED for concern of complaints listed in HPI, this involves an extensive number of treatment options, and is a complaint that carries with it a high risk of complications and morbidity. Disposition including potential need for admission considered.   Dispo: DC Home. Return precautions discussed including, but not limited to, those listed in the AVS. Allowed pt time to ask questions which were answered fully prior to dc.  Records reviewed Outpatient Clinic Notes I have reviewed the patients home medications and made adjustments as needed  Portions of this note were generated with Dragon dictation software. Dictation errors may occur despite best attempts at proofreading.     Final diagnoses:  Closed fracture of left foot with routine healing, subsequent encounter    ED Discharge Orders          Ordered    oxyCODONE  (ROXICODONE ) 5 MG immediate release tablet  Every 4 hours PRN        10/27/24 0734               Yolande Lamar BROCKS, MD 10/27/24 [phone]

## 2024-11-11 ENCOUNTER — Ambulatory Visit: Admitting: Student in an Organized Health Care Education/Training Program

## 2024-11-11 ENCOUNTER — Encounter: Payer: Self-pay | Admitting: Student in an Organized Health Care Education/Training Program

## 2024-11-11 VITALS — BP 146/83 | HR 93 | Wt 205.0 lb

## 2024-11-11 DIAGNOSIS — K580 Irritable bowel syndrome with diarrhea: Secondary | ICD-10-CM

## 2024-11-11 DIAGNOSIS — F339 Major depressive disorder, recurrent, unspecified: Secondary | ICD-10-CM

## 2024-11-11 MED ORDER — VIBERZI 100 MG PO TABS: 100.0000 mg | ORAL_TABLET | Freq: Two times a day (BID) | ORAL | 2 refills | Status: AC

## 2024-11-11 MED ORDER — OMEPRAZOLE 20 MG PO CPDR: 20.0000 mg | DELAYED_RELEASE_CAPSULE | Freq: Every day | ORAL | 0 refills | Status: DC

## 2024-11-11 MED ORDER — DULOXETINE HCL 30 MG PO CPEP: 30.0000 mg | ORAL_CAPSULE | Freq: Every day | ORAL | 3 refills | Status: AC

## 2024-11-11 NOTE — Progress Notes (Signed)
 "  Acute Office Visit  Patient ID: Alan Sanders, male    DOB: 09-16-1971, 53 y.o.   MRN: [id number]  PCP: Jerrell Alan Ned, MD  Chief Complaint  Patient presents with   Medical Management of Chronic Issues    Discuss medications with provider      Subjective:     HPI  Discussed the use of AI scribe software for clinical note transcription with the patient, who gave verbal consent to proceed.  History of Present Illness Alan Sanders is a 53 year old male who presents with foot pain and gastrointestinal issues.  He experiences significant foot pain, described as a burning sensation. He reports that his podiatrist thinks it is related to nerve issues. He has been prescribed Lyrica, taken every 12 hours, but it has not been effective in alleviating his symptoms. He has been using Lyrica for about two weeks.  He is experiencing gastrointestinal issues, including diarrhea occurring three to four times a day, particularly after eating. He has tried over-the-counter Pepto Bismol and Imodium  without relief. He has been prescribed Bentyl  for crampy abdominal pain but is unsure of its effectiveness. He recalls seeing a GI doctor, Dr. Stacia, but does not remember the specific treatment plan.  He is currently on duloxetine  for depression, which he takes daily. He has not had a refill since April and is seeking to renew his prescriptions. He expresses uncertainty about whether his nerves or depression are affecting his gastrointestinal symptoms.  He reports a recent weight loss, now weighing 205 pounds, and attributes this to eating only once a day due to stomach issues and depression. No alcohol consumption, even during the holidays.  His blood pressure readings have been variable, with a recent high reading of 146/83, but he notes a previous reading of 184/unknown, which he questions. He has not been on treatment for hypertension as previous readings were normal.       Objective:    BP (!) 146/83 (BP Location: Left Arm, Cuff Size: Normal)   Pulse 93   Wt 205 lb (93 kg)   SpO2 99%   BMI 27.05 kg/m   Physical Exam  Gen: Well-appearing man Neck: Normal thyroid , no nodules or adenopathy Heart: Regular, no murmur Lungs: Unlabored, clear throughout Abd: Soft, mild tenderness in the left lower quadrants, nondistended, no rebound or guarding, small umbilical hernia without skin changes easily reducible.     Assessment & Plan:   Problem List Items Addressed This Visit       High   Depression, recurrent - Primary (Chronic)   Chronic issue of depressed mood, lots of social stress and chronic illnesses contributing.  Finding it difficult at the holidays.  I prescribed Cymbalta  a while ago, is only been filled once in April for a 30-day supply.  We talked about restarting the Cymbalta  and being more consistent with the use of this medication.  I sent a 90-day supply to his pharmacy. He is going to start taking this medication, follow-up with me in 1 month and we will consider increasing the dose if needed.      Relevant Medications   DULoxetine  (CYMBALTA ) 30 MG capsule     Low   IBS (irritable bowel syndrome) (Chronic)   Chronic issue for many years, intermittent course but currently pretty high symptom burden that is affecting his daily life.  He is having 3-4 loose bowel movements per day, he is having a strong gastrocolic reflex with postprandial bowel movements.  This is starting to impact his eating, and he is having some unintentional weight loss, down 10 pounds over the last 4 months.  He has been working with Dr. Stacia from GI on this for many years.  Has had multiple endoscopies.  No improvement with dicyclomine , hyoscyamine , or loperamide .  Some of his issues are consistency of medication use.  We talked about better consistency.  Decided to try Viberzi.  He has had no gallbladder surgery in the past, and denies using any alcohol currently.  I  recommend starting Viberzi 100 mg twice daily and follow-up with me in 1 month.      Relevant Medications   omeprazole  (PRILOSEC) 20 MG capsule   Eluxadoline (VIBERZI) 100 MG TABS     Meds ordered this encounter  Medications   omeprazole  (PRILOSEC) 20 MG capsule    Sig: Take 1 capsule (20 mg total) by mouth daily.    Dispense:  30 capsule    Refill:  0   DULoxetine  (CYMBALTA ) 30 MG capsule    Sig: Take 1 capsule (30 mg total) by mouth daily.    Dispense:  90 capsule    Refill:  3   Eluxadoline (VIBERZI) 100 MG TABS    Sig: Take 1 tablet (100 mg total) by mouth 2 (two) times daily with a meal.    Dispense:  60 tablet    Refill:  2    Return in about 4 weeks (around 12/09/2024).  Alan Debby Specking, MD [facility]   "

## 2024-11-11 NOTE — Assessment & Plan Note (Signed)
 Chronic issue of depressed mood, lots of social stress and chronic illnesses contributing.  Finding it difficult at the holidays.  I prescribed Cymbalta  a while ago, is only been filled once in April for a 30-day supply.  We talked about restarting the Cymbalta  and being more consistent with the use of this medication.  I sent a 90-day supply to his pharmacy. He is going to start taking this medication, follow-up with me in 1 month and we will consider increasing the dose if needed.

## 2024-11-11 NOTE — Assessment & Plan Note (Signed)
 Chronic issue for many years, intermittent course but currently pretty high symptom burden that is affecting his daily life.  He is having 3-4 loose bowel movements per day, he is having a strong gastrocolic reflex with postprandial bowel movements.  This is starting to impact his eating, and he is having some unintentional weight loss, down 10 pounds over the last 4 months.  He has been working with Dr. Stacia from GI on this for many years.  Has had multiple endoscopies.  No improvement with dicyclomine , hyoscyamine , or loperamide .  Some of his issues are consistency of medication use.  We talked about better consistency.  Decided to try Viberzi .  He has had no gallbladder surgery in the past, and denies using any alcohol currently.  I recommend starting Viberzi  100 mg twice daily and follow-up with me in 1 month.

## 2024-11-15 ENCOUNTER — Telehealth: Payer: Self-pay

## 2024-11-15 NOTE — Telephone Encounter (Signed)
 Called patient and let him know that all medications besides the Lyrica was sent in. Patient is needing pregabalin (LYRICA) 75 MG capsule refilled. Okay to refill?

## 2024-11-15 NOTE — Telephone Encounter (Signed)
 Copied from CRM #[id number]. Topic: Clinical - Prescription Issue >> Nov 15, 2024  1:26 PM Viola F wrote: Patient said 4 medications were suppose to be sent to his pharmacy last week but he only received the DULoxetine  (CYMBALTA ) 30 MG capsule and the omeprazole  (PRILOSEC) 20 MG capsule   He also needs migraines medication and muscles relaxer - he doesn't know the names of the medications. Please call him at  [phone]

## 2024-11-15 NOTE — Telephone Encounter (Signed)
 Lyrica was filled on 12/4 for 30 days.  At this time, he should not need a refill.  Can request next week closer to when medication is needed

## 2024-11-16 NOTE — Telephone Encounter (Signed)
 Called patient and left vm to return call. If patient calls back please relay Dr. Charis message

## 2024-11-18 NOTE — Telephone Encounter (Signed)
 Called patient back and he does not remember picking up lyrica. He said he will call his pharmacy  Copied from CRM [id number]. Topic: Clinical - Medication Question >> Nov 18, 2024  2:23 PM Mercedes MATSU wrote: Reason for CRM: Patient called in wanting to know what the medication Lyrica was. I relayed MyChart message and he is still requesting a call back from the nurse. Cb: [phone]

## 2024-11-18 NOTE — Telephone Encounter (Signed)
 Called patient and left vm to return call. If patient calls back please relay Dr. Charis message

## 2024-11-23 ENCOUNTER — Other Ambulatory Visit: Payer: Self-pay | Admitting: Orthopedic Surgery

## 2024-11-23 DIAGNOSIS — R0989 Other specified symptoms and signs involving the circulatory and respiratory systems: Secondary | ICD-10-CM

## 2024-11-23 DIAGNOSIS — M79604 Pain in right leg: Secondary | ICD-10-CM

## 2024-11-27 ENCOUNTER — Other Ambulatory Visit: Payer: Self-pay | Admitting: Gastroenterology

## 2024-12-03 ENCOUNTER — Other Ambulatory Visit: Payer: Self-pay | Admitting: Student in an Organized Health Care Education/Training Program

## 2024-12-03 DIAGNOSIS — K580 Irritable bowel syndrome with diarrhea: Secondary | ICD-10-CM

## 2024-12-05 NOTE — Telephone Encounter (Signed)
 Okay to change to a 90D supply?

## 2024-12-07 ENCOUNTER — Other Ambulatory Visit: Payer: Self-pay | Admitting: Student in an Organized Health Care Education/Training Program

## 2024-12-07 ENCOUNTER — Inpatient Hospital Stay: Admission: RE | Admit: 2024-12-07 | Source: Ambulatory Visit

## 2024-12-07 DIAGNOSIS — J301 Allergic rhinitis due to pollen: Secondary | ICD-10-CM

## 2024-12-09 ENCOUNTER — Ambulatory Visit: Admitting: Student in an Organized Health Care Education/Training Program

## 2024-12-15 ENCOUNTER — Ambulatory Visit: Admitting: Student in an Organized Health Care Education/Training Program

## 2024-12-23 ENCOUNTER — Other Ambulatory Visit: Payer: Self-pay

## 2024-12-23 ENCOUNTER — Emergency Department (HOSPITAL_BASED_OUTPATIENT_CLINIC_OR_DEPARTMENT_OTHER)
Admission: EM | Admit: 2024-12-23 | Discharge: 2024-12-23 | Disposition: A | Discharge status: Home / Self Care | Attending: Emergency Medicine | Admitting: Emergency Medicine

## 2024-12-23 ENCOUNTER — Emergency Department (HOSPITAL_BASED_OUTPATIENT_CLINIC_OR_DEPARTMENT_OTHER)

## 2024-12-23 ENCOUNTER — Encounter (HOSPITAL_BASED_OUTPATIENT_CLINIC_OR_DEPARTMENT_OTHER): Payer: Self-pay

## 2024-12-23 ENCOUNTER — Emergency Department (HOSPITAL_BASED_OUTPATIENT_CLINIC_OR_DEPARTMENT_OTHER): Admitting: Radiology

## 2024-12-23 DIAGNOSIS — R519 Headache, unspecified: Secondary | ICD-10-CM | POA: Insufficient documentation

## 2024-12-23 DIAGNOSIS — W000XXA Fall on same level due to ice and snow, initial encounter: Secondary | ICD-10-CM | POA: Insufficient documentation

## 2024-12-23 DIAGNOSIS — M545 Low back pain, unspecified: Secondary | ICD-10-CM | POA: Insufficient documentation

## 2024-12-23 DIAGNOSIS — R110 Nausea: Secondary | ICD-10-CM | POA: Insufficient documentation

## 2024-12-23 DIAGNOSIS — W19XXXA Unspecified fall, initial encounter: Secondary | ICD-10-CM

## 2024-12-23 MED ORDER — IBUPROFEN 400 MG PO TABS
600.0000 mg | ORAL_TABLET | Freq: Once | ORAL | Status: AC
  Administered 2024-12-23: 600 mg via ORAL
  Filled 2024-12-23: qty 1

## 2024-12-23 MED ORDER — ONDANSETRON 4 MG PO TBDP
4.0000 mg | ORAL_TABLET | Freq: Once | ORAL | Status: AC
  Administered 2024-12-23: 4 mg via ORAL
  Filled 2024-12-23: qty 1

## 2024-12-23 MED ORDER — ONDANSETRON 4 MG PO TBDP: 4.0000 mg | ORAL_TABLET | Freq: Three times a day (TID) | ORAL | 0 refills | Status: AC | PRN

## 2024-12-23 MED ORDER — ACETAMINOPHEN 500 MG PO TABS
1000.0000 mg | ORAL_TABLET | Freq: Once | ORAL | Status: AC
  Administered 2024-12-23: 1000 mg via ORAL
  Filled 2024-12-23: qty 2

## 2024-12-23 MED ORDER — CYCLOBENZAPRINE HCL 10 MG PO TABS: 5.0000 mg | ORAL_TABLET | Freq: Every evening | ORAL | 0 refills | Status: AC | PRN

## 2024-12-23 MED ORDER — LIDOCAINE 5 % EX PTCH
2.0000 | MEDICATED_PATCH | CUTANEOUS | Status: DC
  Administered 2024-12-23: 2 via TRANSDERMAL
  Filled 2024-12-23: qty 2

## 2024-12-23 NOTE — ED Triage Notes (Signed)
 Pt reports falling after slipping on ice. Pt struck head. No blood thinners. No LOC. Pt refusing c-collar.

## 2024-12-23 NOTE — ED Provider Notes (Signed)
 " [place] EMERGENCY DEPARTMENT AT [facility] Provider Note   CSN: [id number] Arrival date & time: 12/23/24  1736     Patient presents with: Alan Sanders is a 54 y.o. male with remote history of seizures, presents with concern for mechanical fall that occurred earlier today.  Reports that he slipped on black ice, and fell directly onto his lower back and did hit the back of his head.  Denies any loss of consciousness.  He is not on any blood thinners.  Patient currently reporting a mild headache as well as lower back pain.  He does report some nausea but no vomiting.  Denies any changes in vision.  He denies any numbness or tingling or weakness in his upper or lower extremities.    Fall       Prior to Admission medications  Medication Sig Start Date End Date Taking? Authorizing Provider  cyclobenzaprine  (FLEXERIL ) 10 MG tablet Take 0.5-1 tablets (5-10 mg total) by mouth at bedtime as needed for muscle spasms (muscle pain). 12/23/24  Yes Veta Palma, PA-C  ondansetron  (ZOFRAN -ODT) 4 MG disintegrating tablet Take 1 tablet (4 mg total) by mouth every 8 (eight) hours as needed for nausea or vomiting. 12/23/24  Yes Veta Palma, PA-C  cetirizine  (ZYRTEC ) 10 MG tablet TAKE 1 TABLET BY MOUTH EVERY DAY 12/07/24   Jerrell Cleatus Ned, MD  DULoxetine  (CYMBALTA ) 30 MG capsule Take 1 capsule (30 mg total) by mouth daily. 11/11/24   Jerrell Cleatus Ned, MD  Eluxadoline  (VIBERZI ) 100 MG TABS Take 1 tablet (100 mg total) by mouth 2 (two) times daily with a meal. 11/11/24   Jerrell Cleatus Ned, MD  omeprazole  (PRILOSEC) 20 MG capsule TAKE 1 CAPSULE BY MOUTH EVERY DAY 12/05/24   Jerrell Cleatus Ned, MD  pregabalin (LYRICA) 75 MG capsule Take 75 mg by mouth 2 (two) times daily.    [provider]  SUMAtriptan  (IMITREX ) 25 MG tablet TAKE 1 TABLET BY MOUTH AS NEEDED FOR MIGRAINE. MAY REPEAT IN 2 HOURS IF HEADACHE PERSISTS/RECURS 10/13/24   Jerrell Cleatus Ned, MD    Allergies: Fentanyl  and Hydromorphone     Review of Systems  Musculoskeletal:  Positive for back pain.    Updated Vital Signs BP (!) 139/92 (BP Location: Right Arm)   Pulse 78   Temp 98 F (36.7 C)   Resp 18   Ht 6' (1.829 m)   Wt 94.8 kg   SpO2 100%   BMI 28.35 kg/m   Physical Exam Vitals and nursing note reviewed.  Constitutional:      General: He is not in acute distress.    Appearance: He is well-developed.  HENT:     Head: Normocephalic and atraumatic.     Comments: No raccoon eyes, no Battle sign.  No hematomas, lacerations, abrasions, or ecchymoses of the head Eyes:     Extraocular Movements: Extraocular movements intact.     Conjunctiva/sclera: Conjunctivae normal.     Pupils: Pupils are equal, round, and reactive to light.  Cardiovascular:     Rate and Rhythm: Normal rate and regular rhythm.     Heart sounds: No murmur heard.    Comments: 2+ radial and pedal pulses bilaterally Pulmonary:     Effort: Pulmonary effort is normal. No respiratory distress.     Breath sounds: Normal breath sounds.  Abdominal:     Palpations: Abdomen is soft.     Tenderness: There is no abdominal tenderness.  Musculoskeletal:  General: No swelling.     Cervical back: Neck supple.     Comments: General No obvious deformity. No erythema, edema, contusions, open wounds   Palpation Non-tender to palpation of the clavicles,humerus, radius and ulna, carpal bones, 1st-5th metacarpals and phalanges bilaterally Non tender over the femur, patella, tibia or fibula bilaterally  Mild tenderness over the lower lumbar spine and tailbone.  Mild tenderness over the lumbar musculature bilaterally.  Non-tender over the cervical or thoracic spinous processes. Non-tender to palpation of the chest wall/ribs diffusely  No tenderness of the pelvis diffusely  ROM Full ROM of shoulders bilaterally Full elbow, wrist, knee flexion and extension bilaterally Intact plantarflexion  and dorsiflexion, hip flexion bilaterally  Sensation: Sensation intact throughout the bilateral upper and lower extremity  Strength: 5/5 strength with resisted elbow and wrist flexion and extension bilaterally 5/5 strength with resisted knee flexion and extension and ankle plantarflexion and dorsiflexion bilaterally    Skin:    General: Skin is warm and dry.     Capillary Refill: Capillary refill takes less than 2 seconds.  Neurological:     General: No focal deficit present.     Mental Status: He is alert and oriented to person, place, and time.  Psychiatric:        Mood and Affect: Mood normal.     (all labs ordered are listed, but only abnormal results are displayed) Labs Reviewed - No data to display  EKG: None  Radiology: DG Lumbar Spine Complete Result Date: 12/23/2024 CLINICAL DATA:  Fell on ice, lower back pain EXAM: DG LUMBAR SPINE COMPLETE 4+V COMPARISON:  02/16/2022 FINDINGS: Frontal, bilateral oblique, lateral views of the lumbar spine are obtained. There are 5 non-rib-bearing lumbar type vertebral bodies with mild left convex curvature centered at L2-3. There are no acute displaced fractures. Mild lumbar spondylosis greatest from T12-L1 through L2-L3. Sacroiliac joints are unremarkable. IMPRESSION: 1. No acute fracture. 2. Mild multilevel spondylosis and left convex scoliosis. Electronically Signed   By: Ozell Daring M.D.   On: 12/23/2024 21:19   CT Cervical Spine Wo Contrast Result Date: 12/23/2024 EXAM: CT CERVICAL SPINE WITHOUT CONTRAST 12/23/2024 07:31:24 PM TECHNIQUE: CT of the cervical spine was performed without the administration of intravenous contrast. Multiplanar reformatted images are provided for review. Automated exposure control, iterative reconstruction, and/or weight based adjustment of the mA/kV was utilized to reduce the radiation dose to as low as reasonably achievable. COMPARISON: MRI of the cervical spine 06/21/2022. CLINICAL HISTORY: WORK Ataxia,  cervical trauma. FINDINGS: BONES AND ALIGNMENT: Straightening of the normal cervical lordosis is present. Solid anterior fusion is present at C3-C4. No acute fracture or traumatic malalignment. DEGENERATIVE CHANGES: At C3-C4, osteophytic ridging is asymmetric on the left with moderate left central canal and foraminal stenosis. At C5-C6, asymmetric right-sided uncovertebral spurring leads to moderate to severe right foraminal stenosis. SOFT TISSUES: No prevertebral soft tissue swelling. IMPRESSION: 1. No acute findings. Electronically signed by: Lonni Necessary MD 12/23/2024 07:43 PM EST RP Workstation: [id number]   CT Head Wo Contrast Result Date: 12/23/2024 EXAM: CT HEAD WITHOUT CONTRAST 12/23/2024 07:31:24 PM TECHNIQUE: CT of the head was performed without the administration of intravenous contrast. Automated exposure control, iterative reconstruction, and/or weight based adjustment of the mA/kV was utilized to reduce the radiation dose to as low as reasonably achievable. COMPARISON: CT head and MR head without contrast dated 07/11/2024. CLINICAL HISTORY: Ataxia; head trauma. Fall on ice. FINDINGS: BRAIN AND VENTRICLES: No acute hemorrhage. No evidence of acute infarct. No hydrocephalus. No  extra-axial collection. No mass effect or midline shift. ORBITS: No acute abnormality. SINUSES: No acute abnormality. SOFT TISSUES AND SKULL: No acute soft tissue abnormality. No skull fracture. IMPRESSION: 1. No acute intracranial abnormality. Electronically signed by: Lonni Necessary MD 12/23/2024 07:39 PM EST RP Workstation: [id number]     Procedures   Medications Ordered in the ED  lidocaine  (LIDODERM ) 5 % 2 patch (2 patches Transdermal Patch Applied 12/23/24 1956)  acetaminophen  (TYLENOL ) tablet 1,000 mg (1,000 mg Oral Given 12/23/24 1955)  ondansetron  (ZOFRAN -ODT) disintegrating tablet 4 mg (4 mg Oral Given 12/23/24 2050)  ibuprofen  (ADVIL ) tablet 600 mg (600 mg Oral Given 12/23/24 2050)                                     Medical Decision Making Amount and/or Complexity of Data Reviewed Radiology: ordered.  Risk OTC drugs. Prescription drug management.    Differential diagnosis includes but is not limited to intracranial hemorrhage, concussion, skull fracture, other fracture, dislocation, sprain, strain, contusion, laceration, nerve injury, vascular injury, compartment syndrome  ED Course:  Upon initial evaluation, patient is well-appearing, no acute distress.  Patient without any obvious signs of head trauma, but does report hitting his head earlier today when slipping on ice.  He does not have any neurologic deficits.  He is reporting pain to his lower back.  He does have some tenderness over the lower lumbar spine as well as the tailbone.  No tenderness over the cervical or thoracic spine.  Nontender over the ribs diffusely.  He does not have any tenderness of the upper or lower extremities or the pelvis.  Will obtain CT head and cervical spine given mechanism of injury to rule out skull fracture or intracranial hemorrhage.  Will also obtain x-ray of the lumbar spine for evaluation of his pain here.   Imaging Studies ordered: I ordered imaging studies including x-ray lumbar spine, CT head and cervical spine I independently visualized the imaging with scope of interpretation limited to determining acute life threatening conditions related to emergency care. Imaging showed  X-ray lumbar spine:  IMPRESSION:  1. No acute fracture.  2. Mild multilevel spondylosis and left convex scoliosis.   CT cervical spine and CT head without acute abnormality  I agree with the radiologist interpretation   Medications Given: Zofran  Ibuprofen  Tylenol  Lidocaine  patch  Imaging was reviewed which did not show any acute injury in the lumbar spine.  No abnormality noted on CT head or cervical spine.  Suspect that patient's symptoms of headache and nausea are secondary to a concussion given  reassuring head CT and no neurologic deficits on exam.  He reports that the Zofran  did seem to help with the nausea and headache is improved with Tylenol  and ibuprofen  here.  X-ray of the lumbar spine did not show any acute injury, suspect contusion given he fell on this area as a cause of his pain.  He is able to ambulate without difficulty, neurovascularly intact in the lower extremities.  Patient stable and appropriate for discharge home.     Impression: Mechanical fall Concussion Lower back pain, likely secondary to bone contusion  Disposition:  Patient discharged home with instructions to take Tylenol  and ibuprofen  as needed for pain.  Take Flexeril  as needed for muscle pain.  He understands that Flexeril  may make him drowsy and do not drink alcohol or drive after taking this medication.  May use Zofran  as needed  for nausea.  Follow-up with PCP if symptoms not improving within the next week. Return precautions given and patient verbalized understanding.    This chart was dictated using voice recognition software, Dragon. Despite the best efforts of this provider to proofread and correct errors, errors may still occur which can change documentation meaning.       Final diagnoses:  Fall, initial encounter    ED Discharge Orders          Ordered    ondansetron  (ZOFRAN -ODT) 4 MG disintegrating tablet  Every 8 hours PRN        12/23/24 2214    cyclobenzaprine  (FLEXERIL ) 10 MG tablet  At bedtime PRN        12/23/24 2214               Veta Palma, PA-C 12/23/24 2221  "

## 2024-12-23 NOTE — ED Notes (Signed)
 Reviewed discharge instructions, medications, and home care with pt. Pt verbalized understanding and had no further questions. Pt exited ED without complications.

## 2024-12-23 NOTE — Discharge Instructions (Addendum)
 The CT of your head and neck as well as the x-ray of your lower back/spine did not show any acute injuries.  You appear to have a concussion which is a state of changed mental ability from trauma.  Refrain from any strenuous physical activities or any activities that require lots of focus for the next 48 hours. Decrease your screen time (time on phones, TV, laptop, etc) to no more than 30 minutes per day while symptoms persist and get at least 8 hours of sleep at night.   You may engage in light physical activity (such as walking) as long as it does not exacerbate your symptoms.  You may return to work/school as tolerated.  You may take up to 1000mg  of tylenol  every 6 hours as needed for pain.  Do not take more then 4g per day.  You may use up to 600mg  ibuprofen  every 6 hours as needed for pain.  Do not exceed 2.4g of ibuprofen  per day.  You have been prescribed Zofran  (ondansetron ) for nausea and vomiting. You may take this every 8 hours as needed for nausea and vomiting. This medication dissolves under the tongue. You do not need to swallow it.  You have been prescribed a muscle relaxer called Flexeril  (cyclobenzaprine ). You may take 0.5 - 1 tablet (5-10mg ) before bed as needed for muscle pain. This medication can be sedating. Do not drive or operate heavy machinery after taking this medicine. Do not drink alcohol or take other sedating medications when taking this medicine for safety reasons.  Keep this out of reach of small children.   Return to the ER if: There is severe confusion or drowsiness You have repetitive vomiting   You notice dizziness or unsteadiness which is getting worse, or inability to walk.  You lose consciousness You experience severe, persistent headaches not relieved by Tylenol  or ibuprofen  There are changes in pupil sizes. (This is the black center in the colored part of the eye)  You have changes in your vision Any other new or concerning symptoms
